# Patient Record
Sex: Female | Born: 1955
Health system: Southern US, Community
[De-identification: ages and names within clinical notes are randomized; demographics above are authoritative.]

## PROBLEM LIST (undated history)

## (undated) ENCOUNTER — Inpatient Hospital Stay (HOSPITAL_COMMUNITY): Payer: 59

## (undated) DIAGNOSIS — M419 Scoliosis, unspecified: Secondary | ICD-10-CM

## (undated) DIAGNOSIS — D696 Thrombocytopenia, unspecified: Secondary | ICD-10-CM

## (undated) DIAGNOSIS — I480 Paroxysmal atrial fibrillation: Secondary | ICD-10-CM

## (undated) DIAGNOSIS — E119 Type 2 diabetes mellitus without complications: Secondary | ICD-10-CM

## (undated) DIAGNOSIS — H524 Presbyopia: Secondary | ICD-10-CM

## (undated) DIAGNOSIS — I5032 Chronic diastolic (congestive) heart failure: Secondary | ICD-10-CM

## (undated) DIAGNOSIS — H52203 Unspecified astigmatism, bilateral: Secondary | ICD-10-CM

## (undated) DIAGNOSIS — E739 Lactose intolerance, unspecified: Secondary | ICD-10-CM

## (undated) DIAGNOSIS — G4720 Circadian rhythm sleep disorder, unspecified type: Secondary | ICD-10-CM

## (undated) DIAGNOSIS — R112 Nausea with vomiting, unspecified: Secondary | ICD-10-CM

## (undated) DIAGNOSIS — Z1589 Genetic susceptibility to other disease: Secondary | ICD-10-CM

## (undated) DIAGNOSIS — R609 Edema, unspecified: Secondary | ICD-10-CM

## (undated) DIAGNOSIS — H16223 Keratoconjunctivitis sicca, not specified as Sjogren's, bilateral: Secondary | ICD-10-CM

## (undated) DIAGNOSIS — IMO0002 Reserved for concepts with insufficient information to code with codable children: Secondary | ICD-10-CM

## (undated) DIAGNOSIS — M7711 Lateral epicondylitis, right elbow: Secondary | ICD-10-CM

## (undated) DIAGNOSIS — G4730 Sleep apnea, unspecified: Secondary | ICD-10-CM

## (undated) DIAGNOSIS — D6861 Antiphospholipid syndrome: Secondary | ICD-10-CM

## (undated) DIAGNOSIS — K5790 Diverticulosis of intestine, part unspecified, without perforation or abscess without bleeding: Secondary | ICD-10-CM

## (undated) DIAGNOSIS — K760 Fatty (change of) liver, not elsewhere classified: Secondary | ICD-10-CM

## (undated) DIAGNOSIS — J302 Other seasonal allergic rhinitis: Secondary | ICD-10-CM

## (undated) DIAGNOSIS — M65342 Trigger finger, left ring finger: Secondary | ICD-10-CM

## (undated) DIAGNOSIS — I440 Atrioventricular block, first degree: Secondary | ICD-10-CM

## (undated) DIAGNOSIS — Z87442 Personal history of urinary calculi: Secondary | ICD-10-CM

## (undated) DIAGNOSIS — T7840XA Allergy, unspecified, initial encounter: Secondary | ICD-10-CM

## (undated) DIAGNOSIS — M40209 Unspecified kyphosis, site unspecified: Secondary | ICD-10-CM

## (undated) DIAGNOSIS — M5137 Other intervertebral disc degeneration, lumbosacral region: Secondary | ICD-10-CM

## (undated) DIAGNOSIS — K219 Gastro-esophageal reflux disease without esophagitis: Secondary | ICD-10-CM

## (undated) DIAGNOSIS — N390 Urinary tract infection, site not specified: Secondary | ICD-10-CM

## (undated) DIAGNOSIS — E042 Nontoxic multinodular goiter: Secondary | ICD-10-CM

## (undated) DIAGNOSIS — I10 Essential (primary) hypertension: Secondary | ICD-10-CM

## (undated) DIAGNOSIS — R7302 Impaired glucose tolerance (oral): Secondary | ICD-10-CM

## (undated) DIAGNOSIS — E785 Hyperlipidemia, unspecified: Secondary | ICD-10-CM

## (undated) DIAGNOSIS — M461 Sacroiliitis, not elsewhere classified: Secondary | ICD-10-CM

## (undated) DIAGNOSIS — N8110 Cystocele, unspecified: Secondary | ICD-10-CM

## (undated) DIAGNOSIS — N200 Calculus of kidney: Secondary | ICD-10-CM

## (undated) DIAGNOSIS — R002 Palpitations: Secondary | ICD-10-CM

## (undated) DIAGNOSIS — H7292 Unspecified perforation of tympanic membrane, left ear: Secondary | ICD-10-CM

## (undated) DIAGNOSIS — I491 Atrial premature depolarization: Secondary | ICD-10-CM

## (undated) DIAGNOSIS — N870 Mild cervical dysplasia: Secondary | ICD-10-CM

## (undated) DIAGNOSIS — M1990 Unspecified osteoarthritis, unspecified site: Secondary | ICD-10-CM

## (undated) DIAGNOSIS — N12 Tubulo-interstitial nephritis, not specified as acute or chronic: Secondary | ICD-10-CM

## (undated) DIAGNOSIS — K559 Vascular disorder of intestine, unspecified: Secondary | ICD-10-CM

## (undated) DIAGNOSIS — Z8742 Personal history of other diseases of the female genital tract: Secondary | ICD-10-CM

## (undated) DIAGNOSIS — I471 Supraventricular tachycardia: Secondary | ICD-10-CM

## (undated) DIAGNOSIS — Z8719 Personal history of other diseases of the digestive system: Secondary | ICD-10-CM

## (undated) DIAGNOSIS — R011 Cardiac murmur, unspecified: Secondary | ICD-10-CM

## (undated) DIAGNOSIS — D135 Benign neoplasm of extrahepatic bile ducts: Secondary | ICD-10-CM

## (undated) DIAGNOSIS — G43009 Migraine without aura, not intractable, without status migrainosus: Secondary | ICD-10-CM

## (undated) DIAGNOSIS — E669 Obesity, unspecified: Secondary | ICD-10-CM

## (undated) DIAGNOSIS — B9620 Unspecified Escherichia coli [E. coli] as the cause of diseases classified elsewhere: Secondary | ICD-10-CM

## (undated) DIAGNOSIS — R7303 Prediabetes: Secondary | ICD-10-CM

## (undated) DIAGNOSIS — N943 Premenstrual tension syndrome: Secondary | ICD-10-CM

## (undated) DIAGNOSIS — R600 Localized edema: Secondary | ICD-10-CM

## (undated) DIAGNOSIS — I4510 Unspecified right bundle-branch block: Secondary | ICD-10-CM

## (undated) DIAGNOSIS — H5203 Hypermetropia, bilateral: Secondary | ICD-10-CM

## (undated) DIAGNOSIS — K8020 Calculus of gallbladder without cholecystitis without obstruction: Secondary | ICD-10-CM

## (undated) DIAGNOSIS — R0902 Hypoxemia: Secondary | ICD-10-CM

## (undated) DIAGNOSIS — N7011 Chronic salpingitis: Secondary | ICD-10-CM

## (undated) DIAGNOSIS — K589 Irritable bowel syndrome without diarrhea: Secondary | ICD-10-CM

## (undated) DIAGNOSIS — G43909 Migraine, unspecified, not intractable, without status migrainosus: Secondary | ICD-10-CM

## (undated) DIAGNOSIS — E559 Vitamin D deficiency, unspecified: Secondary | ICD-10-CM

## (undated) DIAGNOSIS — R319 Hematuria, unspecified: Secondary | ICD-10-CM

## (undated) DIAGNOSIS — A419 Sepsis, unspecified organism: Secondary | ICD-10-CM

## (undated) DIAGNOSIS — K769 Liver disease, unspecified: Secondary | ICD-10-CM

## (undated) DIAGNOSIS — M7989 Other specified soft tissue disorders: Secondary | ICD-10-CM

## (undated) DIAGNOSIS — F329 Major depressive disorder, single episode, unspecified: Secondary | ICD-10-CM

## (undated) DIAGNOSIS — D219 Benign neoplasm of connective and other soft tissue, unspecified: Secondary | ICD-10-CM

## (undated) DIAGNOSIS — F988 Other specified behavioral and emotional disorders with onset usually occurring in childhood and adolescence: Secondary | ICD-10-CM

## (undated) DIAGNOSIS — Z9889 Other specified postprocedural states: Secondary | ICD-10-CM

## (undated) DIAGNOSIS — F32A Depression, unspecified: Secondary | ICD-10-CM

## (undated) HISTORY — PX: DUPUYTREN CONTRACTURE RELEASE: SHX1478

## (undated) HISTORY — DX: Other specified soft tissue disorders: M79.89

## (undated) HISTORY — PX: CERVICAL CONE BIOPSY: SUR198

## (undated) HISTORY — PX: BLADDER SURGERY: SHX569

## (undated) HISTORY — DX: Cardiac murmur, unspecified: R01.1

## (undated) HISTORY — DX: Impaired glucose tolerance (oral): R73.02

## (undated) HISTORY — PX: ESOPHAGOGASTRODUODENOSCOPY: SHX1529

## (undated) HISTORY — DX: Reserved for concepts with insufficient information to code with codable children: IMO0002

## (undated) HISTORY — DX: Circadian rhythm sleep disorder, unspecified type: G47.20

## (undated) HISTORY — DX: Presbyopia: H52.4

## (undated) HISTORY — DX: Sacroiliitis, not elsewhere classified: M46.1

## (undated) HISTORY — DX: Lactose intolerance, unspecified: E73.9

## (undated) HISTORY — PX: EYE SURGERY: SHX253

## (undated) HISTORY — PX: OTHER SURGICAL HISTORY: SHX169

## (undated) HISTORY — DX: Fatty (change of) liver, not elsewhere classified: K76.0

## (undated) HISTORY — DX: Palpitations: R00.2

## (undated) HISTORY — DX: Depression, unspecified: F32.A

## (undated) HISTORY — DX: Premenstrual tension syndrome: N94.3

## (undated) HISTORY — DX: Trigger finger, left ring finger: M65.342

## (undated) HISTORY — DX: Unspecified astigmatism, bilateral: H52.203

## (undated) HISTORY — DX: Hypermetropia, bilateral: H52.03

## (undated) HISTORY — DX: Cystocele, unspecified: N81.10

## (undated) HISTORY — DX: Genetic susceptibility to other disease: Z15.89

## (undated) HISTORY — DX: Hyperlipidemia, unspecified: E78.5

## (undated) HISTORY — DX: Vitamin D deficiency, unspecified: E55.9

## (undated) HISTORY — DX: Unspecified kyphosis, site unspecified: M40.209

## (undated) HISTORY — DX: Essential (primary) hypertension: I10

## (undated) HISTORY — DX: Migraine, unspecified, not intractable, without status migrainosus: G43.909

## (undated) HISTORY — DX: Mild cervical dysplasia: N87.0

## (undated) HISTORY — DX: Calculus of gallbladder without cholecystitis without obstruction: K80.20

## (undated) HISTORY — DX: Sleep apnea, unspecified: G47.30

## (undated) HISTORY — DX: Personal history of other diseases of the digestive system: Z87.19

## (undated) HISTORY — PX: TRANSOBTURATOR SLING: SHX2571

## (undated) HISTORY — DX: Nontoxic multinodular goiter: E04.2

## (undated) HISTORY — PX: COLONOSCOPY W/ BIOPSIES: SHX1374

## (undated) HISTORY — DX: Major depressive disorder, single episode, unspecified: F32.9

## (undated) HISTORY — DX: Type 2 diabetes mellitus without complications: E11.9

## (undated) HISTORY — PX: LIVER BIOPSY: SHX301

## (undated) HISTORY — DX: Lateral epicondylitis, right elbow: M77.11

## (undated) HISTORY — DX: Vascular disorder of intestine, unspecified: K55.9

## (undated) HISTORY — DX: Chronic diastolic (congestive) heart failure: I50.32

## (undated) HISTORY — DX: Migraine without aura, not intractable, without status migrainosus: G43.009

## (undated) HISTORY — DX: Irritable bowel syndrome, unspecified: K58.9

## (undated) HISTORY — DX: Prediabetes: R73.03

## (undated) HISTORY — DX: Atrial premature depolarization: I49.1

## (undated) HISTORY — PX: DIAGNOSTIC LAPAROSCOPY: SUR761

## (undated) HISTORY — PX: BREAST BIOPSY: SHX20

## (undated) HISTORY — DX: Keratoconjunctivitis sicca, not specified as Sjogren's, bilateral: H16.223

## (undated) HISTORY — DX: Unspecified osteoarthritis, unspecified site: M19.90

## (undated) HISTORY — DX: Allergy, unspecified, initial encounter: T78.40XA

---

## 1988-05-20 HISTORY — PX: LASIK: SHX215

## 1992-05-20 HISTORY — PX: PHOTOREFRACTIVE KERATOTOMY: SHX216

## 1992-05-20 HISTORY — PX: RADIAL KERATOTOMY: SHX217

## 1999-10-19 ENCOUNTER — Encounter: Admission: RE | Admit: 1999-10-19 | Discharge: 1999-10-19 | Payer: Self-pay | Admitting: Obstetrics and Gynecology

## 1999-10-19 ENCOUNTER — Encounter: Payer: Self-pay | Admitting: Obstetrics and Gynecology

## 1999-10-26 ENCOUNTER — Other Ambulatory Visit: Admission: RE | Admit: 1999-10-26 | Discharge: 1999-10-26 | Payer: Self-pay | Admitting: Obstetrics and Gynecology

## 1999-11-09 ENCOUNTER — Encounter: Payer: Self-pay | Admitting: Obstetrics and Gynecology

## 1999-11-09 ENCOUNTER — Ambulatory Visit (HOSPITAL_COMMUNITY): Admission: RE | Admit: 1999-11-09 | Discharge: 1999-11-09 | Payer: Self-pay | Admitting: Obstetrics and Gynecology

## 2001-12-31 ENCOUNTER — Encounter: Payer: Self-pay | Admitting: Cardiovascular Disease

## 2001-12-31 ENCOUNTER — Encounter: Payer: Self-pay | Admitting: Obstetrics and Gynecology

## 2001-12-31 ENCOUNTER — Other Ambulatory Visit: Admission: RE | Admit: 2001-12-31 | Discharge: 2001-12-31 | Payer: Self-pay | Admitting: Obstetrics and Gynecology

## 2001-12-31 ENCOUNTER — Encounter: Admission: RE | Admit: 2001-12-31 | Discharge: 2001-12-31 | Payer: Self-pay | Admitting: Obstetrics and Gynecology

## 2002-01-05 ENCOUNTER — Encounter: Admission: RE | Admit: 2002-01-05 | Discharge: 2002-01-05 | Payer: Self-pay | Admitting: Obstetrics and Gynecology

## 2002-01-05 ENCOUNTER — Encounter: Payer: Self-pay | Admitting: Obstetrics and Gynecology

## 2002-01-05 ENCOUNTER — Encounter (INDEPENDENT_AMBULATORY_CARE_PROVIDER_SITE_OTHER): Payer: Self-pay | Admitting: *Deleted

## 2002-03-17 ENCOUNTER — Ambulatory Visit (HOSPITAL_BASED_OUTPATIENT_CLINIC_OR_DEPARTMENT_OTHER): Admission: RE | Admit: 2002-03-17 | Discharge: 2002-03-17 | Payer: Self-pay | Admitting: Surgery

## 2002-03-17 ENCOUNTER — Encounter (INDEPENDENT_AMBULATORY_CARE_PROVIDER_SITE_OTHER): Payer: Self-pay | Admitting: *Deleted

## 2002-03-26 ENCOUNTER — Ambulatory Visit (HOSPITAL_COMMUNITY): Admission: RE | Admit: 2002-03-26 | Discharge: 2002-03-26 | Payer: Self-pay | Admitting: Obstetrics and Gynecology

## 2002-03-26 ENCOUNTER — Encounter (INDEPENDENT_AMBULATORY_CARE_PROVIDER_SITE_OTHER): Payer: Self-pay | Admitting: Specialist

## 2003-03-25 ENCOUNTER — Other Ambulatory Visit: Admission: RE | Admit: 2003-03-25 | Discharge: 2003-03-25 | Payer: Self-pay | Admitting: Obstetrics and Gynecology

## 2003-04-19 ENCOUNTER — Encounter: Admission: RE | Admit: 2003-04-19 | Discharge: 2003-04-19 | Payer: Self-pay | Admitting: Family Medicine

## 2003-05-19 ENCOUNTER — Encounter: Payer: Self-pay | Admitting: Internal Medicine

## 2003-06-08 ENCOUNTER — Encounter: Payer: Self-pay | Admitting: Internal Medicine

## 2003-07-14 ENCOUNTER — Encounter: Payer: Self-pay | Admitting: Internal Medicine

## 2003-07-21 ENCOUNTER — Encounter: Payer: Self-pay | Admitting: Internal Medicine

## 2003-08-23 ENCOUNTER — Encounter: Payer: Self-pay | Admitting: Cardiovascular Disease

## 2003-09-01 ENCOUNTER — Encounter: Payer: Self-pay | Admitting: Internal Medicine

## 2004-04-26 ENCOUNTER — Other Ambulatory Visit: Admission: RE | Admit: 2004-04-26 | Discharge: 2004-04-26 | Payer: Self-pay | Admitting: Obstetrics and Gynecology

## 2004-08-28 ENCOUNTER — Encounter (INDEPENDENT_AMBULATORY_CARE_PROVIDER_SITE_OTHER): Payer: Self-pay | Admitting: Specialist

## 2004-08-28 ENCOUNTER — Encounter: Admission: RE | Admit: 2004-08-28 | Discharge: 2004-08-28 | Payer: Self-pay | Admitting: Obstetrics and Gynecology

## 2004-12-11 ENCOUNTER — Encounter: Admission: RE | Admit: 2004-12-11 | Discharge: 2005-03-11 | Payer: Self-pay | Admitting: Family Medicine

## 2005-04-02 ENCOUNTER — Encounter: Admission: RE | Admit: 2005-04-02 | Discharge: 2005-04-02 | Payer: Self-pay | Admitting: Obstetrics and Gynecology

## 2005-05-24 ENCOUNTER — Other Ambulatory Visit: Admission: RE | Admit: 2005-05-24 | Discharge: 2005-05-24 | Payer: Self-pay | Admitting: Obstetrics and Gynecology

## 2005-05-25 ENCOUNTER — Ambulatory Visit (HOSPITAL_COMMUNITY): Admission: RE | Admit: 2005-05-25 | Discharge: 2005-05-25 | Payer: Self-pay | Admitting: Obstetrics and Gynecology

## 2005-05-25 ENCOUNTER — Encounter (INDEPENDENT_AMBULATORY_CARE_PROVIDER_SITE_OTHER): Payer: Self-pay | Admitting: Specialist

## 2005-07-25 ENCOUNTER — Encounter: Admission: RE | Admit: 2005-07-25 | Discharge: 2005-10-23 | Payer: Self-pay | Admitting: Family Medicine

## 2005-10-28 ENCOUNTER — Encounter: Admission: RE | Admit: 2005-10-28 | Discharge: 2005-10-28 | Payer: Self-pay | Admitting: Obstetrics and Gynecology

## 2006-05-06 ENCOUNTER — Encounter: Payer: Self-pay | Admitting: Cardiovascular Disease

## 2007-03-13 ENCOUNTER — Inpatient Hospital Stay (HOSPITAL_COMMUNITY): Admission: AD | Admit: 2007-03-13 | Discharge: 2007-03-13 | Payer: Self-pay | Admitting: Obstetrics and Gynecology

## 2007-05-27 ENCOUNTER — Encounter: Payer: Self-pay | Admitting: Cardiovascular Disease

## 2007-08-06 ENCOUNTER — Encounter: Payer: Self-pay | Admitting: Cardiovascular Disease

## 2007-08-07 ENCOUNTER — Ambulatory Visit (HOSPITAL_COMMUNITY): Admission: RE | Admit: 2007-08-07 | Discharge: 2007-08-07 | Payer: Self-pay | Admitting: Obstetrics and Gynecology

## 2008-05-20 DIAGNOSIS — M419 Scoliosis, unspecified: Secondary | ICD-10-CM

## 2008-05-20 HISTORY — DX: Scoliosis, unspecified: M41.9

## 2008-07-14 ENCOUNTER — Encounter
Admission: RE | Admit: 2008-07-14 | Discharge: 2008-07-14 | Payer: Self-pay | Admitting: Physical Medicine and Rehabilitation

## 2008-10-06 ENCOUNTER — Encounter: Payer: Self-pay | Admitting: Internal Medicine

## 2008-11-17 HISTORY — PX: KNEE ARTHROSCOPY: SHX127

## 2009-04-10 ENCOUNTER — Encounter: Admission: RE | Admit: 2009-04-10 | Discharge: 2009-04-10 | Payer: Self-pay | Admitting: Obstetrics and Gynecology

## 2009-07-20 ENCOUNTER — Encounter (INDEPENDENT_AMBULATORY_CARE_PROVIDER_SITE_OTHER): Payer: Self-pay | Admitting: *Deleted

## 2009-08-05 ENCOUNTER — Encounter: Payer: Self-pay | Admitting: Internal Medicine

## 2009-08-08 ENCOUNTER — Encounter: Payer: Self-pay | Admitting: Internal Medicine

## 2009-08-22 ENCOUNTER — Encounter (INDEPENDENT_AMBULATORY_CARE_PROVIDER_SITE_OTHER): Payer: Self-pay | Admitting: *Deleted

## 2009-08-22 ENCOUNTER — Ambulatory Visit: Payer: Self-pay | Admitting: Internal Medicine

## 2009-08-22 DIAGNOSIS — R197 Diarrhea, unspecified: Secondary | ICD-10-CM | POA: Insufficient documentation

## 2009-08-22 DIAGNOSIS — R159 Full incontinence of feces: Secondary | ICD-10-CM | POA: Insufficient documentation

## 2009-08-24 LAB — CONVERTED CEMR LAB: Tissue Transglutaminase Ab, IgA: 0.3 units (ref <7)

## 2009-08-25 ENCOUNTER — Ambulatory Visit (HOSPITAL_COMMUNITY): Admission: RE | Admit: 2009-08-25 | Discharge: 2009-08-25 | Payer: Self-pay | Admitting: Obstetrics and Gynecology

## 2009-11-17 HISTORY — PX: DILATION AND CURETTAGE OF UTERUS: SHX78

## 2009-12-14 ENCOUNTER — Ambulatory Visit: Payer: Self-pay | Admitting: Sports Medicine

## 2009-12-14 DIAGNOSIS — IMO0002 Reserved for concepts with insufficient information to code with codable children: Secondary | ICD-10-CM | POA: Insufficient documentation

## 2009-12-15 ENCOUNTER — Encounter: Admission: RE | Admit: 2009-12-15 | Discharge: 2009-12-15 | Payer: Self-pay | Admitting: Sports Medicine

## 2009-12-28 ENCOUNTER — Ambulatory Visit: Payer: Self-pay | Admitting: Sports Medicine

## 2009-12-28 DIAGNOSIS — IMO0002 Reserved for concepts with insufficient information to code with codable children: Secondary | ICD-10-CM | POA: Insufficient documentation

## 2010-01-03 ENCOUNTER — Ambulatory Visit: Payer: Self-pay | Admitting: Sports Medicine

## 2010-01-09 ENCOUNTER — Encounter: Payer: Self-pay | Admitting: Sports Medicine

## 2010-01-09 ENCOUNTER — Encounter: Admission: RE | Admit: 2010-01-09 | Discharge: 2010-02-14 | Payer: Self-pay | Admitting: Sports Medicine

## 2010-02-14 ENCOUNTER — Encounter: Payer: Self-pay | Admitting: Sports Medicine

## 2010-04-01 ENCOUNTER — Emergency Department (HOSPITAL_COMMUNITY)
Admission: EM | Admit: 2010-04-01 | Discharge: 2010-04-01 | Payer: Self-pay | Source: Home / Self Care | Admitting: Family Medicine

## 2010-06-09 ENCOUNTER — Encounter: Payer: Self-pay | Admitting: Obstetrics and Gynecology

## 2010-06-19 NOTE — Letter (Signed)
Summary: Clovis Pu PA  Clovis Pu PA   Imported By: Sherian Rein 08/30/2009 07:56:39  _____________________________________________________________________  External Attachment:    Type:   Image     Comment:   External Document

## 2010-06-19 NOTE — Procedures (Signed)
Summary: EGD  EGD   Imported By: Sherian Rein 08/30/2009 07:52:27  _____________________________________________________________________  External Attachment:    Type:   Image     Comment:   External Document

## 2010-06-19 NOTE — Procedures (Signed)
Summary: Colonoscopy  Colonoscopy   Imported By: Sherian Rein 08/30/2009 07:51:42  _____________________________________________________________________  External Attachment:    Type:   Image     Comment:   External Document

## 2010-06-19 NOTE — Letter (Signed)
Summary: [facility] Instructions  [facility]  [address] [place], [place] [address]   Phone: [phone]  Fax: [phone]       Alexandra Walker    [birth year]    MRN: [id number]      Procedure Day Dorna Bloom: Jake Shark, Sep 19, 2009     Arrival Time: 2:30 PM      Procedure Time: 3:30 PM    Location of Procedure:                    _X_  [facility] (4th Floor)                      PREPARATION FOR COLONOSCOPY WITH MOVIPREP   Starting 5 days prior to your procedure 09/16/09 do not eat nuts, seeds, popcorn, corn, beans, peas,  salads, or any raw vegetables.  Do not take any fiber supplements (e.g. Metamucil, Citrucel, and Benefiber).  THE DAY BEFORE YOUR PROCEDURE         MONDAY, 09/18/09  1.  Drink clear liquids the entire day-NO SOLID FOOD  2.  Do not drink anything colored red or purple.  Avoid juices with pulp.  No orange juice.  3.  Drink at least 64 oz. (8 glasses) of fluid/clear liquids during the day to prevent dehydration and help the prep work efficiently.  CLEAR LIQUIDS INCLUDE: Water Jello Ice Popsicles Tea (sugar ok, no milk/cream) Powdered fruit flavored drinks Coffee (sugar ok, no milk/cream) Gatorade Juice: apple, white grape, white cranberry  Lemonade Clear bullion, consomm, broth Carbonated beverages (any kind) Strained chicken noodle soup Hard Candy                             4.  In the morning, mix first dose of MoviPrep solution:    Empty 1 Pouch A and 1 Pouch B into the disposable container    Add lukewarm drinking water to the top line of the container. Mix to dissolve    Refrigerate (mixed solution should be used within 24 hrs)  5.  Begin drinking the prep at 5:00 p.m. The MoviPrep container is divided by 4 marks.   Every 15 minutes drink the solution down to the next mark (approximately 8 oz) until the full liter is complete.   6.  Follow completed prep with 16 oz of clear liquid of your choice (Nothing red or purple).   Continue to drink clear liquids until bedtime.  7.  Before going to bed, mix second dose of MoviPrep solution:    Empty 1 Pouch A and 1 Pouch B into the disposable container    Add lukewarm drinking water to the top line of the container. Mix to dissolve    Refrigerate  THE DAY OF YOUR PROCEDURE     TUESDAY, 09/19/09  Beginning at 10:30 a.m. (5 hours before procedure):         1. Every 15 minutes, drink the solution down to the next mark (approx 8 oz) until the full liter is complete.  2. Follow completed prep with 16 oz. of clear liquid of your choice.    3. You may drink clear liquids until 1:30 PM (2 HOURS BEFORE PROCEDURE).  MEDICATION INSTRUCTIONS  Unless otherwise instructed, you should take regular prescription medications with a small sip of water   as early as possible the morning of your procedure.       OTHER INSTRUCTIONS  You will need a responsible adult at least 53 years of age to accompany you and drive you home.   This person must remain in the waiting room during your procedure.  Wear loose fitting clothing that is easily removed.  Leave jewelry and other valuables at home.  However, you may wish to bring a book to read or  an iPod/MP3 player to listen to music as you wait for your procedure to start.  Remove all body piercing jewelry and leave at home.  Total time from sign-in until discharge is approximately 2-3 hours.  You should go home directly after your procedure and rest.  You can resume normal activities the  day after your procedure.  The day of your procedure you should not:   Drive   Make legal decisions   Operate machinery   Drink alcohol   Return to work  You will receive specific instructions about eating, activities and medications before you leave.  The above instructions have been reviewed and explained to me by   STEPHHANIE, CMA   I fully understand and can verbalize these instructions _____________________________ Date  _________

## 2010-06-19 NOTE — Assessment & Plan Note (Signed)
Summary: BACK INJECTION,MC   Vital Signs:  Patient profile:   55 year old female BP sitting:   146 / 74  Vitals Entered By: Lillia Pauls CMA (January 03, 2010 10:16 AM)  Primary Provider:  Montey Hora, MD   History of Present Illness: Alexandra Walker was doing better she wound up selling her house so quickly had to do a lot of moving boxes, etc This led to another flare of low thoracic back pain severe enough that on weekend had [facility] this helped only briefly  had one day unable to work but icing all during day broke a lot of the spasm  comes for reck and TX plan  Allergies: No Known Drug Allergies  Physical Exam  General:  Well-developed,well-nourished,in no acute distress; alert,appropriate and cooperative throughout examination Msk:  Mid back is free of spasm On RT paraspinous area of T 10 there is localized area of tightness, spasm and TTP  othyerwise exam is not remarkable   Impression & Recommendations:  Problem # 1:  DEGENERATIVE DISC DISEASE, 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 Once daily 2)  Toprol Xl 25 Mg Xr24h-tab (Metoprolol succinate) .... Once daily 3)  Hydrochlorothiazide 25 Mg Tabs (Hydrochlorothiazide) .... Once daily 4)  Motrin Ib 200 Mg Tabs (Ibuprofen) .... As needed 5)  Tramadol Hcl 50 Mg Tabs (Tramadol hcl) .Marland Kitchen.. 1 by mouth q 6h 6)  Gabapentin 300 Mg Caps (Gabapentin) .Marland Kitchen.. 1 by mouth tid  Patient Instructions: 1)  Use ice for spasm relief as this works 2)  start gabapentin just at night 3)  300 mgm per night for 1 week 4)   if not enough relief go 600 mgm at night 5)  after 3 weeks if no relief go to 900 at bedtime before you see me 6)  Keep up tramadol at least two times a day 7)  recheck 1 month Prescriptions: GABAPENTIN 300 MG CAPS (GABAPENTIN) 1 by mouth tid  #90 x 3   Entered and Authorized by:   Enid Baas MD   Signed by:   Enid Baas MD on 01/03/2010   Method used:   Electronically to        [facility]* (retail)       [address]       [address] Shipping/mailing       [place], [place]  [address]       Ph: [phone]       Fax: [phone]   RxID:   [id number]

## 2010-06-19 NOTE — Letter (Signed)
Summary: [facility]  [facility]   Imported By: Sherian Rein 08/30/2009 07:46:25  _____________________________________________________________________  External Attachment:    Type:   Image     Comment:   External Document

## 2010-06-19 NOTE — Assessment & Plan Note (Signed)
Summary: 4:00,MID BACK PAIN,[employer] EMPLOYEE,MC   Vital Signs:  Patient profile:   55 year old female Height:      67 inches Pulse rate:   85 / minute BP sitting:   144 / 83  (left arm) Cuff size:   large  Vitals Entered By: Tessie Fass CMA (December 14, 2009 4:12 PM) CC: back pain x 3 days Pain Assessment Patient in pain? yes     Location: back Intensity: 5   Primary Provider:  Montey Hora, MD  CC:  back pain x 3 days.  History of Present Illness: Patient w hx of LBP works as Statistician at [employer] 5 to 6 days ago LBP hurt past 2 to 3 days severe spasm  pain radiates f low thoracic to abdominal area primarily around RT side  2 prior episodes lasted about 2 weeks  physically acitve but not in execise program  Saw Dr [name] Ishihara were negative lidoderm and flexoril helped last episode went thru PT for exercises  Take motrin for SI joint soma in day and flexeril at night  Allergies: No Known Drug Allergies  Physical Exam  General:  Obese,,well-nourished,in no acute distress; alert,appropriate and cooperative throughout examination  looks uncomfortable w movement Msk:  Spasm noted from T6 to T10 primarily on RT paraspinous mm facet jts seem locked low back not tender  heel , toe and tandem walk nl flex 90 deg no pain 30 deg ext causes pain lat bend to rt pain at 10 deg lat bend to left OK   Impression & Recommendations:  Problem # 1:  BACK PAIN WITH RADICULOPATHY (ICD-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 Once daily 2)  Toprol Xl 25 Mg Xr24h-tab (Metoprolol succinate) .... Once daily 3)  Hydrochlorothiazide 25 Mg Tabs (Hydrochlorothiazide) .... Once daily 4)  Motrin Ib 200 Mg Tabs (Ibuprofen) .... As needed 5)  Tramadol Hcl 50 Mg Tabs (Tramadol hcl) .Marland Kitchen.. 1 by mouth q 6h Prescriptions: TRAMADOL HCL 50 MG TABS (TRAMADOL HCL) 1 by mouth q 6h  #60 x 2   Entered and Authorized by:   Enid Baas MD   Signed by:   Enid Baas MD on 12/14/2009   Method used:   Electronically to        [facility]* (retail)       [address]       [address] Shipping/mailing       [place], [place]  [address]       Ph: [phone]       Fax: [phone]   RxID:   [id number]

## 2010-06-19 NOTE — Letter (Signed)
[facility]   Imported By: Sherian Rein 08/30/2009 07:53:15  _____________________________________________________________________  External Attachment:    Type:   Image     Comment:   External Document

## 2010-06-19 NOTE — Letter (Signed)
[facility]   Imported By: Sherian Rein 08/30/2009 07:54:26  _____________________________________________________________________  External Attachment:    Type:   Image     Comment:   External Document

## 2010-06-19 NOTE — Letter (Signed)
[facility]   Imported By: Sherian Rein 08/30/2009 07:49:35  _____________________________________________________________________  External Attachment:    Type:   Image     Comment:   External Document

## 2010-06-19 NOTE — Procedures (Signed)
Summary: EGD & Colonoscopy/[facility]  [facility] & [facility]oscopy/[facility]   Imported By: Sherian Rein 08/30/2009 08:01:36  _____________________________________________________________________  External Attachment:    Type:   Image     Comment:   External Document

## 2010-06-19 NOTE — Letter (Signed)
Summary: New Patient letter  [facility]  [address] [place], [place] [address]   Phone: [phone]  Fax: [phone]       07/20/2009 MRN: [id number]  Alexandra Walker [address] [place], [place]  [address]  Dear Alexandra Walker,  Welcome to the Gastroenterology Division at [facility].    You are scheduled to see Dr. Leone Payor on 08-22-09 at 8:45a.m. on the 3rd floor at [facility], [address]  We ask that you try to arrive at our office 15 minutes prior to your appointment time to allow for check-in.  We would like you to complete the enclosed self-administered evaluation form prior to your visit and bring it with you on the day of your appointment.  We will review it with you.  Also, please bring a complete list of all your medications or, if you prefer, bring the medication bottles and we will list them.  Please bring your insurance card so that we may make a copy of it.  If your insurance requires a referral to see a specialist, please bring your referral form from your primary care physician.  Co-payments are due at the time of your visit and may be paid by cash, check or credit card.     Your office visit will consist of a consult with your physician (includes a physical exam), any laboratory testing he/she may order, scheduling of any necessary diagnostic testing (e.g. x-ray, ultrasound, CT-scan), and scheduling of a procedure (e.g. Endoscopy, Colonoscopy) if required.  Please allow enough time on your schedule to allow for any/all of these possibilities.    If you cannot keep your appointment, please call [phone] to cancel or reschedule prior to your appointment date.  This allows [name] the opportunity to schedule an appointment for another patient in need of care.  If you do not cancel or reschedule by 5 p.m. the business day prior to your appointment date, you will be charged a $50.00 late cancellation/no-show fee.    Thank you for choosing  [facility] for your medical needs.  We appreciate the opportunity to care for you.  Please visit [name] at our website  to learn more about our practice.                     Sincerely,                                                             The Gastroenterology Division

## 2010-06-19 NOTE — Assessment & Plan Note (Signed)
Summary: POST MRI,MC   Vital Signs:  Patient profile:   55 year old female BP sitting:   136 / 81  Vitals Entered By: Lillia Pauls CMA (December 28, 2009 10:23 AM)  Primary Provider:  Montey Hora, MD   History of Present Illness: Hilda Lias improved dramatically with some low dose tramadol and rest. Mid back pain stpped suddely after resting on couch about 3 days after starting tramadol. 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 Does not remember any diagnosis of back issues though. told to do posture exercises.  nopw using ibuprofen for sciatica.  not using tramadol.  comes for review of MRI and prevention of future flares.  Allergies: No Known Drug Allergies  Physical Exam  General:  Well-developed,well-nourished,in no acute distress; alert,appropriate and cooperative throughout examination Msk:  posture allows head forward by 5 deg  ant rotation of shoulders with mild IR of scapula bilat  stands with mild thoracic kyphosis  walking gait looks normal today Additional Exam:  MRI  reviewed w patient and shows a lot of thoracic area change mid thoracic compression and DDD Schmorl's nodes disk bulging and some cord signal change in thoracic area   Impression & Recommendations:  Problem # 1:  BACK PAIN WITH RADICULOPATHY (ICD-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 Once daily 2)  Toprol Xl 25 Mg Xr24h-tab (Metoprolol succinate) .... Once daily 3)  Hydrochlorothiazide 25 Mg Tabs  (Hydrochlorothiazide) .... Once daily 4)  Motrin Ib 200 Mg Tabs (Ibuprofen) .... As needed 5)  Tramadol Hcl 50 Mg Tabs (Tramadol hcl) .Marland Kitchen.. 1 by mouth q 6h  Appended Document: POST MRI,MC

## 2010-06-19 NOTE — Miscellaneous (Signed)
Summary: [facility]  [facility]   Imported By: Marily Memos 02/15/2010 10:39:45  _____________________________________________________________________  External Attachment:    Type:   Image     Comment:   External Document

## 2010-06-19 NOTE — Letter (Signed)
Summary: [facility]  [facility]   Imported By: Sherian Rein 08/30/2009 07:48:23  _____________________________________________________________________  External Attachment:    Type:   Image     Comment:   External Document

## 2010-06-19 NOTE — Letter (Signed)
[facility]   Imported By: Sherian Rein 08/30/2009 07:58:30  _____________________________________________________________________  External Attachment:    Type:   Image     Comment:   External Document

## 2010-06-19 NOTE — Assessment & Plan Note (Signed)
Summary: IBS--ch.   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 She will get a "pinging "RUQ pain intermittently especially when she has a fever. IBS diarrhea is ruinng her lfe, urgent defecation which has caused incontinence and ne ed to leave delivery room, etc. Chronic for 10-15 years, at its worst 5 days a week. Series of bowel movements, empty and then possibly none the next day. Pre-defecatory cramps only. Not nocturnal issue. Walking and eating may stimulate. She drinks 6-8 diet cokes a day. She has reduced sorbitol consumption. No difference. She went to accupuncture for menopausal symptoms, received an herbal concoction called Chinese Fungus Formula. It will corol the diarrhea. 1-2 bowel movements a day and almost dry. Lactose intolerace is not suspected.  FNH seen on [name] and bx in 1997. All other GI ROS negative.          Preventive Screening-Counseling & Management  Alcohol-Tobacco     Smoking Status: never      Drug Use:  no.      Colonoscopy  Procedure date:  07/14/2003  Findings:      Hepatic flexure inflammation PATH = suspect ischemic colitis Sigmoid diverticulosis Internal hemorrhoids  Dr. Yates Decamp [facility] [place]  EGD  Procedure date:  07/14/2003  Findings:      Mild gatsritis Clotest negative   Current Medications (verified): 1)  Vytorin 10-40 Mg Tabs (Ezetimibe-Simvastatin) .... Once Daily 2)  Toprol Xl 25 Mg Xr24h-Tab (Metoprolol Succinate) .... Once Daily 3)  Hydrochlorothiazide 25 Mg Tabs (Hydrochlorothiazide) .... Once Daily 4)  Motrin Ib 200 Mg Tabs (Ibuprofen) .... 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  Daily Caffeine Use -6 Diet Cokes/day Illicit Drug Use - no Smoking Status:  never Drug Use:  no  Review of Systems       The patient complains of allergy/sinus, arthritis/joint pain, change in vision, heart rhythm changes, and swelling of feet/legs.         presbyopia All other ROS negative except as per HPI.   Vital Signs:  Patient profile:   55 year old female Height:      67 inches Weight:      248.38 pounds BMI:     39.04 Pulse rate:   84 / minute Pulse rhythm:   regular BP sitting:   128 / 84  (left arm) Cuff size:   large  Vitals Entered By: June McMurray CMA Duncan Dull) (August 22, 2009 8:44 AM)  Physical Exam  General:  healthy appearing and obese.   Eyes:  PERRLA, no icterus. Mouth:  No deformity or lesions, dentition normal. Neck:  Supple; no masses or thyromegaly. Lungs:  Clear throughout to auscultation. Heart:  Regular rate and rhythm; no murmurs, rubs,  or bruits. Abdomen:  obese, soft and non-tender BS+ no hsm/MASS Rectal:  deferred until time of colonoscopy.   Extremities:  No clubbing, cyanosis, edema or deformities noted. Neurologic:  Alert and  oriented x4; Cervical Nodes:  No significant cervical or supraclavicular adenopathy.  Psych:  Alert and cooperative. Normal mood and affect.   Impression & Recommendations:  Problem # 1:  DIARRHEA (ICD-787.91) Assessment New Chronic, ? IBD,  microscopic colitis. Not sure what patchy colitis at hepatic flexure in 2005 was. No random bxs then. Celiac is possible also. Chinese fugal extract (designed to treat intestinal Candida) has helped tremendously. Not sure what to make of that. It seems to be a priobiotic. Risks, benefits,and indications of endoscopic procedure(s) were reviewed with the patient and all questions answered.   Orders: T-Tissue Transglutamase Ab IgA [id number]) Colonoscopy (Colon) TLB-IgA (Immunoglobulin A) ([id number])  Problem # 2:  FULL INCONTINENCE OF FECES (ICD-787.60) Assessment: New Associated with urgent call to stool and diarrhe. Fortunately better on the Chinese Fungal Extract.  Problem # 3:  OBESITY (ICD-278.00) Assessment: New discussed some she is aare and has tried changes in food intake not exercising and is planning to  Patient Instructions: 1)  Please go to the basement to have your lab tests drawn today.  2)  Please pick up your medications at your pharmacy. MOVIPREP 3)  We will see you at your procedure on 09/19/09.  If this day does not work for you please call our office to reschedule. 4)  You need to lose weight. Start by limiting portions, amounts. Avoid eating when not hungry. Limit desserts.Look for high fructose corn syrup on food labels and if in first 3 ingredients, avoid that food. Also try to eat whole grains, avoid "white foods" (e.g. white rice, white bread).   5)  Reduce caffeine intake and diet soda intake. 6)  You should try to exercise more. Starting with a walking program 20-30 minutes daily or several tmes a week and gradually increasing can help. 7)  Copy sent to : Dierdre Forth, MD and Montey Hora, MD 8)  [facility] Patient Information Guide given to patient.  9)  Colonoscopy and Flexible Sigmoidoscopy brochure given.  10)  The medication list was reviewed and reconciled.  All changed / newly prescribed medications were explained.  A complete  medication list was provided to the patient / caregiver. Prescriptions: MOVIPREP 100 GM  SOLR (PEG-KCL-NACL-NASULF-NA ASC-C) As per prep instructions.  #1 x 0   Entered by:   Francee Piccolo CMA (AAMA)   Authorized by:   Iva Boop MD, [facility]   Signed by:   Francee Piccolo CMA (AAMA) on 08/22/2009   Method used:   Electronically to        [facility]* (retail)       [address]       [address] [place] Shipping/mailing       [place], [place]  [address]       Ph: [phone]       Fax: [phone]   RxID:   [id number]

## 2010-06-19 NOTE — Miscellaneous (Signed)
Summary: [facility]  [facility]   Imported By: Marily Memos 01/10/2010 08:52:37  _____________________________________________________________________  External Attachment:    Type:   Image     Comment:   External Document

## 2010-07-31 ENCOUNTER — Encounter: Payer: Self-pay | Admitting: *Deleted

## 2010-08-08 LAB — BASIC METABOLIC PANEL
BUN: 15 mg/dL (ref 6–23)
Calcium: 9.6 mg/dL (ref 8.4–10.5)
Glucose, Bld: 133 mg/dL — ABNORMAL HIGH (ref 70–99)
Potassium: 4.1 mEq/L (ref 3.5–5.1)
Sodium: 139 mEq/L (ref 135–145)

## 2010-08-08 LAB — CBC
Hemoglobin: 14.7 g/dL (ref 12.0–15.0)
MCHC: 33.8 g/dL (ref 30.0–36.0)
MCV: 84.3 fL (ref 78.0–100.0)
Platelets: 251 10*3/uL (ref 150–400)
RBC: 5.17 MIL/uL — ABNORMAL HIGH (ref 3.87–5.11)
WBC: 6.9 10*3/uL (ref 4.0–10.5)

## 2010-10-05 NOTE — H&P (Signed)
NAME:  Alexandra Walker, HAEFELE              ACCOUNT NO.:  [id number]   MEDICAL RECORD NO.:  [id number]          PATIENT TYPE:  AMB   LOCATION:  SDC                           FACILITY:  [facility]   PHYSICIAN:  Hal Morales, M.D.DATE OF BIRTH:  08-18-1955   DATE OF ADMISSION:  DATE OF DISCHARGE:                                HISTORY & PHYSICAL   HISTORY OF PRESENT ILLNESS:  Alexandra Walker is a 55 year old white female para  0-0-1-0 who presents for a hysteroscopy with D&C because of symptomatic  endometrial polyps. For the past 6-12 months the patient has experienced  several days of mid cycle bleeding monthly which requires her to wear a pad.  She denies any cramping, fever, changes in bowel habits, urinary tract  symptoms, or vaginitis symptoms. The patient's menstrual flow remains  regular, occurring monthly, lasting for 5 days, and requiring her to change  a pad every 4 hours. A sonohysterogram in November 2006 revealed a fundal  fibroid measuring 1.32 x 1.4 x 1.4 cm and an endometrial polyp measuring 0.7  x 0.7 x 0.8 cm. The patient has consented to proceed with resection of this  symptomatic endometrial polyp.   PAST MEDICAL HISTORY:  Obstetric history:  Gravida 1 para 0-0-1-0 (the  patient does have one adopted daughter).   GYN history:  Menarche 24 years old. Last menstrual period May 12, 2005. She uses abstinence as a method of contraception. 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 The patient underwent a cone biopsy in 2003. Her last Pap  smear which was normal was December 2005. Last normal mammogram November  2006.   Medical history:  1.  Hypertension.  2.  Prediabetes.  3.  Menstrual migraines.  4.  Irritable bowel syndrome.  5.  Hypercholesterolemia.  6.  Endometrial polyp.  7.  Infertility.  8.  Positive for HLA-B27 antibody.  9.  Positive for anticardiolipin antibodies.  10. Benign liver hyperplasia.  11. Seronegative spondylarthritis.   Surgical history:  1.  In  1994, radial keratotomy.  2.  In 1997, laparoscopy with hysteroscopy and endometrial polyp resection.  3.  In 1998, D&C for missed AB.  4.  In 1999, diagnostic laparoscopy for infertility.  5.  In 1999, liver biopsy (benign hyperplasia).  6.  In 2003, cold knife conization (mild squamous dysplasia).  7.  In 2003, left breast biopsy (fibroadenoma).  8.  In 2004, transobturator sling placement.  9.  In 2006, right breast core needle biopsy (fibroadenoma).   The patient states that anesthesia causes severe nausea and vomiting. She  denies any history of blood transfusions.   FAMILY HISTORY:  Positive for cardiovascular disease, hypertension, venous  thromboembolic event, non-insulin-dependent diabetes, cerebrovascular  accident, joint problems, cancer, and hypercholesterolemia.   SOCIAL HISTORY:  The patient is a Immunologist.   HABITS:  She does not use alcohol or tobacco.   CURRENT MEDICATIONS:  1.  Toprol-XL 20 mg daily.  2.  Hydrochlorothiazide 25 mg daily.  3.  Vytorin one tablet daily.  4.  Claritin 10 mg daily.  5.  Motrin 600 mg as needed.  6.  Multivitamin one tablet daily.  7.  Flax seed oil one tablet daily.  8.  Augmentin 500/125 one tablet every 12 hours (the patient is on day #2 of      10-day course for otitis media).   ALLERGIES:  The patient is allergic to PERCOCET and VICODIN, both of which  cause severe vomiting. She also has a reactive to ADHESIVES.   REVIEW OF SYSTEMS:  The patient does wear reading glasses and she has a  resolving left otitis media. Otherwise, review of systems is negative except  as mentioned in history of present illness.   PHYSICAL EXAMINATION:  VITAL SIGNS:  Blood pressure 120/78, height is 5 feet  7 inches tall, weight (December 2005) 257 pounds.  GENERAL:  Ear, noses, and throat within normal limits.  NECK:  Supple without masses. There is no thyromegaly or adenopathy.  HEART:  Regular rate and rhythm. There is no murmur.  LUNGS:  Clear  to auscultation. There are no wheezes, rales or rhonchi.  BACK:  No CVA tenderness.  ABDOMEN:  Bowel sounds are present. It is soft without tenderness, guarding,  rebound, or organomegaly.  EXTREMITIES:  Without clubbing, cyanosis, or edema.  PELVIC:  EG/BUS is within normal limits. The vagina is rugous. The patient  does have menstrual blood present. The cervix is nontender without lesions.  The uterus appears normal size, shape, and consistency without tenderness.  Adnexa without tenderness or masses. Rectovaginal without tenderness or  masses.   IMPRESSION:  1.  Intermenstrual bleeding.  2.  Endometrial polyp.  3.  Resolving left otitis media.   DISPOSITION:  A discussion was held with the patient regarding the  indications for her procedure, along with its risk which include but are not  limited to reactive to anesthesia, damage to adjacent organs, excessive  bleeding, and infection. The patient has consented to proceed with  hysteroscopy, D&C, and resection of endometrial polyp at [facility]  [place] on May 27, 2005, at 2 p.m.      Alexandra Walker.      Hal Morales, M.D.  Electronically Signed    EJP/MEDQ  D:  05/16/2005  T:  05/16/2005  Job:  [id number]

## 2010-10-05 NOTE — Op Note (Signed)
NAME:  Alexandra Walker, Alexandra Walker              ACCOUNT NO.:  [id number]   MEDICAL RECORD NO.:  [id number]          PATIENT TYPE:  AMB   LOCATION:  SDC                           [facility]ACILITY:  [facility]   PHYSICIAN:  Hal Morales, M.D.DATE OF BIRTH:  [birth year]   DATE OF PROCEDURE:  05/25/2005  DATE OF DISCHARGE:                                 OPERATIVE REPORT   PREOPERATIVE DIAGNOSES:  1.  Intermenstrual bleeding.  2.  Endometrial polyp.   POSTOPERATIVE DIAGNOSES:  1.  Intermenstrual bleeding.  2.  Endometrial polyp.   OPERATION:  Diagnostic hysteroscopy, polypectomy, dilatation and curettage.   SURGEON:  Hal Morales, M.D.   ANESTHESIA:  General LMA.   ESTIMATED BLOOD LOSS:  Less than 10 mL.   COMPLICATIONS:  None.   FINDINGS:  The patient had an endometrial polyp attached to the anterior  uterine wall and normal-appearing endometrium otherwise.   PROCEDURE:  The patient was taken to the operating room after appropriate  identification and placed on the operating table.  After the attainment of  adequate general anesthesia, she was placed in the lithotomy position.  A  Graves speculum was placed in the vagina and a Pap smear obtained per  patient request and sent to [facility] for submission.  The  Graves speculum was removed and the perineum and vagina prepped with  multiple layers of Betadine, then draped as a sterile field.  A red Robinson  catheter was used to empty the bladder.  A single-tooth tenaculum was placed  on the anterior cervix and a paracervical block achieved with a total of 10  mL of 2% Xylocaine.  The uterus was sounded to 9 cm.  The cervix was dilated  to a #23 dilator, which would accommodate the diagnostic hysteroscope.  This  was used to document the above-noted findings.  The hysteroscope was removed  and the Randall stone forceps used to remove the polyp.  The hysteroscope  was reinserted to document polyp removal and the endometrial cavity  curetted  in all four quadrants.  All instruments were then removed from the vagina.  Hemostasis was noted to be adequate.  The patient was awakened from general  anesthesia and taken to the  recovery room in satisfactory condition having tolerated the procedure well,  with sponge and instrument counts correct.   SPECIMENS TO PATHOLOGY:  Endometrial polyp and endometrial curetting .      Hal Morales, M.D.  Electronically Signed     VPH/MEDQ  D:  05/25/2005  T:  05/25/2005  Job:  [id number]

## 2011-01-22 ENCOUNTER — Encounter (HOSPITAL_BASED_OUTPATIENT_CLINIC_OR_DEPARTMENT_OTHER)
Admission: RE | Admit: 2011-01-22 | Discharge: 2011-01-22 | Disposition: A | Discharge status: Home / Self Care | Payer: 59 | Source: Ambulatory Visit | Attending: Orthopedic Surgery | Admitting: Orthopedic Surgery

## 2011-01-22 LAB — BASIC METABOLIC PANEL
BUN: 13 mg/dL (ref 6–23)
CO2: 28 mEq/L (ref 19–32)
Calcium: 9 mg/dL (ref 8.4–10.5)
Creatinine, Ser: 0.59 mg/dL (ref 0.50–1.10)
Glucose, Bld: 124 mg/dL — ABNORMAL HIGH (ref 70–99)
Potassium: 3.5 mEq/L (ref 3.5–5.1)

## 2011-01-24 ENCOUNTER — Other Ambulatory Visit: Payer: Self-pay | Admitting: Sports Medicine

## 2011-01-25 ENCOUNTER — Ambulatory Visit (HOSPITAL_BASED_OUTPATIENT_CLINIC_OR_DEPARTMENT_OTHER)
Admission: RE | Admit: 2011-01-25 | Discharge: 2011-01-25 | Disposition: A | Discharge status: Home / Self Care | Payer: 59 | Source: Ambulatory Visit | Attending: Orthopedic Surgery | Admitting: Orthopedic Surgery

## 2011-01-25 ENCOUNTER — Other Ambulatory Visit: Payer: Self-pay | Admitting: Orthopedic Surgery

## 2011-01-25 DIAGNOSIS — E669 Obesity, unspecified: Secondary | ICD-10-CM | POA: Insufficient documentation

## 2011-01-25 DIAGNOSIS — I10 Essential (primary) hypertension: Secondary | ICD-10-CM | POA: Insufficient documentation

## 2011-01-25 DIAGNOSIS — M65839 Other synovitis and tenosynovitis, unspecified forearm: Secondary | ICD-10-CM | POA: Insufficient documentation

## 2011-01-25 DIAGNOSIS — Z01810 Encounter for preprocedural cardiovascular examination: Secondary | ICD-10-CM | POA: Insufficient documentation

## 2011-01-25 DIAGNOSIS — E119 Type 2 diabetes mellitus without complications: Secondary | ICD-10-CM | POA: Insufficient documentation

## 2011-01-25 DIAGNOSIS — Z01812 Encounter for preprocedural laboratory examination: Secondary | ICD-10-CM | POA: Insufficient documentation

## 2011-01-25 DIAGNOSIS — M720 Palmar fascial fibromatosis [Dupuytren]: Secondary | ICD-10-CM | POA: Insufficient documentation

## 2011-01-26 NOTE — Op Note (Signed)
NAMEMarland Kitchen  ENJOLI, TIDD NO.:  [id number]  MEDICAL RECORD NO.:  [id number]  LOCATION:                                 FACILITY:  PHYSICIAN:  Dionne Ano. Crystle Carelli, M.D.     DATE OF BIRTH:  DATE OF PROCEDURE: DATE OF DISCHARGE:                              OPERATIVE REPORT   PREOPERATIVE DIAGNOSIS:  Left ring finger hand mass with associated mechanical symptoms about the A1 pulley interface secondary to tenosynovitis about the flexor digitorum profundus, flexor digitorum superficialis tendon apparatus.  POSTOPERATIVE DIAGNOSIS:  Left ring finger hand mass with associated mechanical symptoms about the A1 pulley interface secondary to tenosynovitis about the flexor digitorum profundus, flexor digitorum superficialis tendon apparatus.  PROCEDURES: 1. Subtotal palmar fasciectomy of left hand in line with the ring     finger including portions of the ring finger. 2. A1 pulley release with associated extensor tenosynovectomy of the     flexor digitorum profundus and flexor digitorum superficialis     tendons, right ring finger.  SURGEON:  Dionne Ano. Amanda Pea, MD  ASSISTANT:  None.  COMPLICATIONS:  None.  ANESTHESIA:  General endotracheal.  TOURNIQUET TIME:  Less than 30 minutes.  INDICATIONS FOR PROCEDURE:  This is a 55 year old female who presents with above-mentioned diagnosis.  We have counseled him in regards to risks and benefits of surgery including risks of infection, bleeding, anesthesia, damage to normal structures, and failure of surgery to accomplish its intended goals of relieving and restoring function.  With this in mind, she decided to proceed.  All questions have been encouraged and answered preoperatively.  OPERATIVE PROCEDURE:  The patient was seen by myself and Anesthesia, taken to the operating suite, and underwent smooth induction of general LMA anesthetic.  Preop nausea precautions and intraoperative nausea precautions were adhered to.   Following this, the patient was prepped and draped in the usual sterile fashion.  A time-out was called. Following this, transverse modified Brunner incision was made in the distal palmar crease.  Dissection was carried down and a septal to palmar fasciectomy was accomplished circumferentially, identified a mass less than 2 cm in nature but proximally 1.5 cm or so.  This was a mass consistent with Dupuytren disease.  It was circumferentially dissected to the pretendinous cord and the natatory cords as well as deep palmar investments were removed in their entirety, sent for specimen, and intraoperative photos were taken.  Following this, the patient had marked tenosynovitis about the FDP and FDS complex with a grinding-grating feeling against the A1 pulley. Thus, we performed A1 pulley release and tenosynovectomy of the FDP and FDS tendons extensive in nature.  Once this was done, I irrigated it copiously, secured hemostasis with bipolar electrocautery, and closed it with interrupted 5-0 Prolene.  We are going to ask her to elevate and move the fingers frequently, notify me should any problems occur, Percocet for pain, vitamin C, Peri-Colace.  Standard postop algorithm will be adhered to.  She has my cellular phone.  If there is any problems in addition to this, she is going to see [name] back in the office in 1 week for dressing change.  She was dressed sterilely, had excellent  refill.  No complicating features.  All sponge, needle, and instrument counts were reported as correct.     Dionne Ano. Amanda Pea, M.D.     [facility]  D:  01/25/2011  T:  01/25/2011  Job:  [id number]  Electronically Signed by Dominica Severin M.D. on 01/26/2011 04:36:04 PM

## 2011-06-28 ENCOUNTER — Other Ambulatory Visit: Payer: Self-pay | Admitting: Obstetrics and Gynecology

## 2011-06-28 DIAGNOSIS — Z1231 Encounter for screening mammogram for malignant neoplasm of breast: Secondary | ICD-10-CM

## 2011-07-05 ENCOUNTER — Ambulatory Visit
Admission: RE | Admit: 2011-07-05 | Discharge: 2011-07-05 | Disposition: A | Discharge status: Home / Self Care | Payer: 59 | Source: Ambulatory Visit | Attending: Obstetrics and Gynecology | Admitting: Obstetrics and Gynecology

## 2011-07-05 DIAGNOSIS — Z1231 Encounter for screening mammogram for malignant neoplasm of breast: Secondary | ICD-10-CM

## 2011-07-16 ENCOUNTER — Ambulatory Visit (INDEPENDENT_AMBULATORY_CARE_PROVIDER_SITE_OTHER): Payer: 59 | Admitting: Obstetrics and Gynecology

## 2011-07-16 DIAGNOSIS — R87612 Low grade squamous intraepithelial lesion on cytologic smear of cervix (LGSIL): Secondary | ICD-10-CM

## 2011-07-16 DIAGNOSIS — Z01419 Encounter for gynecological examination (general) (routine) without abnormal findings: Secondary | ICD-10-CM

## 2011-07-17 ENCOUNTER — Other Ambulatory Visit (INDEPENDENT_AMBULATORY_CARE_PROVIDER_SITE_OTHER): Payer: 59

## 2011-07-17 DIAGNOSIS — N949 Unspecified condition associated with female genital organs and menstrual cycle: Secondary | ICD-10-CM

## 2011-09-19 ENCOUNTER — Ambulatory Visit (INDEPENDENT_AMBULATORY_CARE_PROVIDER_SITE_OTHER): Payer: 59 | Admitting: Cardiovascular Disease

## 2011-09-19 DIAGNOSIS — I471 Supraventricular tachycardia, unspecified: Secondary | ICD-10-CM | POA: Insufficient documentation

## 2011-09-19 DIAGNOSIS — R079 Chest pain, unspecified: Secondary | ICD-10-CM | POA: Insufficient documentation

## 2011-09-19 DIAGNOSIS — I491 Atrial premature depolarization: Secondary | ICD-10-CM

## 2011-09-19 NOTE — Assessment & Plan Note (Signed)
Well controlled with Toprol. No changes.

## 2011-09-19 NOTE — Patient Instructions (Signed)
Your physician wants you to follow-up in:  6 months. You will receive a reminder letter in the mail two months in advance. If you don't receive a letter, please call our office to schedule the follow-up appointment.   

## 2011-09-19 NOTE — Assessment & Plan Note (Signed)
She has chronic, atypical chest pains with negative stress tests in 2003, 2005 and 2009. Her risk factors for CAD include HTN, HLD, borderline DM and a strong family history of CAD. For now, will continue risk factor reduction. Her BP is at goal. Lipids are at goal and managed by primary care. If she has any change in her typical chest pain or has associated dyspnea with the chest pain, we will plan further ischemic testing which will most likely be a cardiac cath.

## 2011-09-19 NOTE — Progress Notes (Signed)
   History of Present Illness: 56 yo WF with history of HTN, HLD, PACs, irritable bowel syndrome who is here today to establish cardiology care. She has been followed in [facility] with Dr. Luberta Robertson. She has had several stress tests that were normal with negative stress echo in 2003, 2005 and a normal stress myoview in 2009. She was recently diagnosed with a kyphosis. She describes chronic chest pain. She has history of PACs that are controlled with Toprol. 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 Her daughter is now 49. She has glucose intolerance. Carotid artery dopplers were normal 12/07.   She tells me that she has been doing well. No changes in her breathing. No change in occasional chest pains. She has chronic lower extremity edema, no changes.   Primary Care Physician:  Elyn Peers [facility])  Last Lipid Profile:  Followed in primary care.   Past Medical History  Diagnosis Date  . Hypertension   . Hyperlipidemia   . IBS (irritable bowel syndrome)   . Glucose intolerance (impaired glucose tolerance)   . PAC (premature atrial contraction)     Past Surgical History  Procedure Date  . Abdominal laparascopic   . Dilation and curettage of uterus   . Left finger surgery     Current Outpatient Prescriptions  Medication Sig Dispense Refill  . hydrochlorothiazide 25 MG tablet Once daily       . ibuprofen (MOTRIN IB) 200 MG tablet as needed.        . metoprolol succinate (TOPROL XL) 25 MG 24 hr tablet Once daily       . simvastatin (ZOCOR) 40 MG tablet Take 40 mg by mouth every evening.      . traMADol (ULTRAM) 50 MG tablet Take 50 mg by mouth every 6 (six) hours as needed.          Allergies  Allergen Reactions  . Percocet (Oxycodone-Acetaminophen)   . Vicodin (Hydrocodone-Acetaminophen)     History   Social History  . Marital Status: Divorced    Spouse Name: N/A    Number of Children: 1  . Years of Education: N/A    Occupational History  . [employer] [place]   Social History Main Topics  . Smoking status: Never Smoker   . Smokeless tobacco: Not on file  . Alcohol Use: No  . Drug Use: No  . Sexually Active: Not on file   Other Topics Concern  . Not on file   Social History Narrative  . No narrative on file    Family History  Problem Relation Age of Onset  . Heart attack Father   . Coronary artery disease Mother   . Hypertension Sister   . Hypertension Brother     Review of Systems:  As stated in the HPI and otherwise negative.   BP 120/88  Pulse 66  Ht 5\' 7"  (1.702 m)  Wt 256 lb (116.121 kg)  BMI 40.10 kg/m2  Physical Examination: General: Well developed, well nourished, NAD HEENT: OP clear, mucus membranes moist SKIN: warm, dry. No rashes. Neuro: No focal deficits Musculoskeletal: Muscle strength 5/5 all ext Psychiatric: Mood and affect normal Neck: No JVD, no carotid bruits, no thyromegaly, no lymphadenopathy. Lungs:Clear bilaterally, no wheezes, rhonci, crackles Cardiovascular: Regular rate and rhythm. No murmurs, gallops or rubs. Abdomen:Soft. Bowel sounds present. Non-tender.  Extremities: Trace bilateral lower extremity edema. Pulses are 2 + in the bilateral DP/PT.

## 2011-10-13 ENCOUNTER — Emergency Department (HOSPITAL_BASED_OUTPATIENT_CLINIC_OR_DEPARTMENT_OTHER)
Admission: EM | Admit: 2011-10-13 | Discharge: 2011-10-14 | Disposition: A | Discharge status: Home / Self Care | Payer: 59 | Attending: Emergency Medicine | Admitting: Emergency Medicine

## 2011-10-13 ENCOUNTER — Encounter (HOSPITAL_BASED_OUTPATIENT_CLINIC_OR_DEPARTMENT_OTHER): Payer: Self-pay | Admitting: *Deleted

## 2011-10-13 DIAGNOSIS — W5501XA Bitten by cat, initial encounter: Secondary | ICD-10-CM

## 2011-10-13 DIAGNOSIS — IMO0001 Reserved for inherently not codable concepts without codable children: Secondary | ICD-10-CM | POA: Insufficient documentation

## 2011-10-13 DIAGNOSIS — Z23 Encounter for immunization: Secondary | ICD-10-CM | POA: Insufficient documentation

## 2011-10-13 DIAGNOSIS — E785 Hyperlipidemia, unspecified: Secondary | ICD-10-CM | POA: Insufficient documentation

## 2011-10-13 DIAGNOSIS — S61409A Unspecified open wound of unspecified hand, initial encounter: Secondary | ICD-10-CM | POA: Insufficient documentation

## 2011-10-13 DIAGNOSIS — K589 Irritable bowel syndrome without diarrhea: Secondary | ICD-10-CM | POA: Insufficient documentation

## 2011-10-13 DIAGNOSIS — I10 Essential (primary) hypertension: Secondary | ICD-10-CM | POA: Insufficient documentation

## 2011-10-13 MED ORDER — RABIES IMMUNE GLOBULIN 150 UNIT/ML IM INJ
20.0000 [IU]/kg | INJECTION | Freq: Once | INTRAMUSCULAR | Status: AC
  Administered 2011-10-14: 2250 [IU]
  Filled 2011-10-13: qty 6

## 2011-10-13 MED ORDER — RABIES VACCINE, PCEC IM SUSR
1.0000 mL | Freq: Once | INTRAMUSCULAR | Status: AC
  Administered 2011-10-14: 1 mL via INTRAMUSCULAR
  Filled 2011-10-13: qty 1

## 2011-10-13 NOTE — ED Notes (Signed)
Pt states that she does not know the animal that bit her and has been unable to locate the animal.

## 2011-10-13 NOTE — ED Provider Notes (Signed)
History   This chart was scribed for Ethelda Chick, MD by Charolett Bumpers . The patient was seen in room [facility]HH2/[facility].    CSN: [id number]  Arrival date & time 10/13/11  2044   First MD Initiated Contact with Patient 10/13/11 2118      Chief Complaint  Patient presents with  . Animal Bite    (Consider location/radiation/quality/duration/timing/severity/associated sxs/prior treatment) HPI Alexandra Walker is a 56 y.o. female who presents to the Emergency Department complaining of constant, mild pain associated with an animal bite on her right hand that occurred 2 days ago. Patient states that she was bitten by a stray cat on Friday and received 2 superficial, puncture wounds on her right hand. Patient states that she seen Friday by her PA and placed on an abx (Augmentin). Patient states that she came to the ED to start the rabies series. Patient denies any significant bleeding. Patient denies any associated symptoms. No pertinent medical or surgical hx reported.   Past Medical History  Diagnosis Date  . Hypertension   . Hyperlipidemia   . IBS (irritable bowel syndrome)   . Glucose intolerance (impaired glucose tolerance)   . PAC (premature atrial contraction)     Past Surgical History  Procedure Date  . Abdominal laparascopic   . Dilation and curettage of uterus   . Left finger surgery     Family History  Problem Relation Age of Onset  . Heart attack Father   . Coronary artery disease Mother   . Hypertension Sister   . Hypertension Brother     History  Substance Use Topics  . Smoking status: Never Smoker   . Smokeless tobacco: Not on file  . Alcohol Use: No    OB History    Grav Para Term Preterm Abortions TAB SAB Ect Mult Living                  Review of Systems A complete 10 system review of systems was obtained and all systems are negative except as noted in the HPI and PMH.   Allergies  Percocet and Vicodin  Home Medications   Current  Outpatient Rx  Name Route Sig Dispense Refill  . AMOXICILLIN-POT CLAVULANATE 875-125 MG PO TABS Oral Take 1 tablet by mouth 2 (two) times daily.    Marland Kitchen VITAMIN D 1000 UNITS PO TABS Oral Take 1,000 Units by mouth daily.    Marland Kitchen GREEN COFFEE BEAN PO Oral Take 1 tablet by mouth daily.    Marland Kitchen HYDROCHLOROTHIAZIDE 25 MG PO TABS  Once daily     . IBUPROFEN 200 MG PO TABS  as needed.      Marland Kitchen ONE-DAILY MULTI VITAMINS PO TABS Oral Take 1 tablet by mouth daily.    Marland Kitchen FISH OIL PO Oral Take 1 tablet by mouth daily.    Marland Kitchen SIMVASTATIN 40 MG PO TABS Oral Take 40 mg by mouth every evening.    Marland Kitchen METOPROLOL SUCCINATE ER 25 MG PO TB24  Once daily       BP 151/73  Pulse 78  Temp(Src) 98.6 F (37 C) (Oral)  Resp 20  Ht 5\' 7"  (1.702 m)  Wt 250 lb (113.399 kg)  BMI 39.16 kg/m2  SpO2 97%  Physical Exam  Nursing note and vitals reviewed. Constitutional: She is oriented to person, place, and time. She appears well-developed and well-nourished. No distress.  HENT:  Head: Normocephalic and atraumatic.  Eyes: EOM are normal. Pupils are equal, round, and reactive  to light.  Neck: Neck supple. No tracheal deviation present.  Cardiovascular: Normal rate.   Pulmonary/Chest: Effort normal. No respiratory distress.  Abdominal: Soft. She exhibits no distension.  Musculoskeletal: Normal range of motion. She exhibits no edema.  Neurological: She is alert and oriented to person, place, and time. No sensory deficit.  Skin: Skin is warm and dry.       2 superficial puncture marks on the thenar eminence of the right hand. No surrounding erythema. No significant tenderness.   Psychiatric: She has a normal mood and affect. Her behavior is normal.    ED Course  Procedures (including critical care time)  DIAGNOSTIC STUDIES: Oxygen Saturation is 97% on room air, normal by my interpretation.    COORDINATION OF CARE:  2150: Discussed planned course of treatment with the patient, who is agreeable at this time. Discussed the need  for an x-ray and the patient declined. Will start the rabies series.  2215: Medication Orders: Rabies vaccine, PCEC (Rabavert) injection 1 mL-once; Rabies immune globulin (Hyperab) injection 2,250 units-once.   11:15 PM Pt is awaiting rabies meds- they are being transported from [facility].    Labs Reviewed - No data to display No results found.   1. Cat bite       MDM  Pt presents with 2 puncture wounds to right hand after being bitten by a stray cat 2 days ago.  She was referred by her PMD to start rabies series.  She has declined xray to evaluate for retained tooth.  PMD had alerady startedher on augmentin.  There is no erythematous streaking of her hand/arm.  Pt discharged with strict return precautions.  She was agreeable with this plan.   I personally performed the services described in this documentation, which was scribed in my presence. The recorded information has been reviewed and considered.        Ethelda Chick, MD 10/14/11 [phone]

## 2011-10-13 NOTE — ED Notes (Signed)
Pt states she was bitten by a stray cat Friday. Puncture wounds x 2 to right hand.

## 2011-10-14 NOTE — ED Notes (Signed)
Flow manager advised of pt's need for rabies series

## 2011-10-14 NOTE — ED Notes (Signed)
D/c home with schedule for rabies f/u and contact # for Urgent Care

## 2011-10-14 NOTE — Discharge Instructions (Signed)
Return to the ED with any concerns including increased pain or redness, swelling, drainage from wound, fever, or any other alarming symptoms  You should follow up according to discharge paperwork to continue vaccination series.

## 2011-10-17 ENCOUNTER — Emergency Department (HOSPITAL_COMMUNITY)
Admission: EM | Admit: 2011-10-17 | Discharge: 2011-10-17 | Disposition: A | Discharge status: Home / Self Care | Payer: 59 | Source: Home / Self Care

## 2011-10-17 ENCOUNTER — Encounter (HOSPITAL_COMMUNITY): Payer: Self-pay | Admitting: *Deleted

## 2011-10-17 DIAGNOSIS — Z23 Encounter for immunization: Secondary | ICD-10-CM

## 2011-10-17 HISTORY — DX: Other seasonal allergic rhinitis: J30.2

## 2011-10-17 MED ORDER — RABIES VACCINE, PCEC IM SUSR
1.0000 mL | Freq: Once | INTRAMUSCULAR | Status: AC
  Administered 2011-10-17: 1 mL via INTRAMUSCULAR

## 2011-10-17 MED ORDER — RABIES VACCINE, PCEC IM SUSR
INTRAMUSCULAR | Status: AC
  Filled 2011-10-17: qty 1

## 2011-10-17 NOTE — ED Notes (Signed)
Here for 2nd rabies shot for cat bite to R hand. No signs of infection.

## 2011-10-17 NOTE — Discharge Instructions (Signed)
Call if any problems.  Return 6/3 @ 1130 for next vaccine.

## 2011-10-23 ENCOUNTER — Telehealth (HOSPITAL_COMMUNITY): Payer: Self-pay | Admitting: *Deleted

## 2011-10-23 ENCOUNTER — Emergency Department (HOSPITAL_COMMUNITY)
Admission: EM | Admit: 2011-10-23 | Discharge: 2011-10-23 | Disposition: A | Discharge status: Home / Self Care | Payer: 59 | Source: Home / Self Care

## 2011-10-23 ENCOUNTER — Encounter (HOSPITAL_COMMUNITY): Payer: Self-pay | Admitting: *Deleted

## 2011-10-23 DIAGNOSIS — Z23 Encounter for immunization: Secondary | ICD-10-CM

## 2011-10-23 MED ORDER — RABIES VACCINE, PCEC IM SUSR
INTRAMUSCULAR | Status: AC
  Filled 2011-10-23: qty 1

## 2011-10-23 MED ORDER — RABIES VACCINE, PCEC IM SUSR
1.0000 mL | Freq: Once | INTRAMUSCULAR | Status: AC
  Administered 2011-10-23: 1 mL via INTRAMUSCULAR

## 2011-10-23 NOTE — ED Notes (Signed)
Here for 3rd rabies vaccine for cat bite.  No previous problems with injections.

## 2011-10-23 NOTE — ED Notes (Signed)
Pt. called back and said Animal control told her on Mon. if the cat was still alive it probably was not rabid. Pt. has seen the cat. It was never caught or tested. I told her that was not real definitive. Discussed with Dr. Tressia Danas and she said the animal can live up to 3 weeks and it can incubate in humans up to a year. She recommends pt. complete the series to make sure she is covered.  Pt. given this information and decided she would complete the series and will come today. Vassie Moselle. 10/23/2011

## 2011-10-23 NOTE — ED Notes (Signed)
Pt. had called on 6/3 and said she would come in some time tomorrow. No record that pt. came on 6/4. I called and left a message for pt. to call to schedule her rabies shot.  I explained that they have to be completed in 14 days with a minimum of 3 days apart. Vassie Moselle 10/23/2011

## 2011-10-23 NOTE — Discharge Instructions (Signed)
Call if any problems. Return 6/10 @ 1130 for last vaccine.

## 2011-10-31 ENCOUNTER — Encounter (HOSPITAL_COMMUNITY): Payer: Self-pay | Admitting: *Deleted

## 2011-10-31 ENCOUNTER — Emergency Department (HOSPITAL_COMMUNITY)
Admission: EM | Admit: 2011-10-31 | Discharge: 2011-10-31 | Disposition: A | Discharge status: Home / Self Care | Payer: 59 | Source: Home / Self Care

## 2011-10-31 DIAGNOSIS — Z23 Encounter for immunization: Secondary | ICD-10-CM

## 2011-10-31 MED ORDER — RABIES VACCINE, PCEC IM SUSR
INTRAMUSCULAR | Status: AC
  Filled 2011-10-31: qty 1

## 2011-10-31 MED ORDER — RABIES VACCINE, PCEC IM SUSR
1.0000 mL | Freq: Once | INTRAMUSCULAR | Status: AC
  Administered 2011-10-31: 1 mL via INTRAMUSCULAR

## 2011-10-31 NOTE — Discharge Instructions (Signed)
Congratulations you have finished your rabies series.  Call if any problems.  If any further rabies exposures, go to the ED and get a rabies titer.  If low you would only need a booster shot.

## 2011-10-31 NOTE — ED Notes (Signed)
Here for last rabies vaccine cat bite R hand.  Arm soreness from last injection.

## 2012-03-18 ENCOUNTER — Encounter: Payer: Self-pay | Admitting: *Deleted

## 2012-03-23 ENCOUNTER — Ambulatory Visit (INDEPENDENT_AMBULATORY_CARE_PROVIDER_SITE_OTHER): Payer: 59 | Admitting: Internal Medicine

## 2012-03-23 ENCOUNTER — Encounter: Payer: Self-pay | Admitting: Internal Medicine

## 2012-03-23 VITALS — BP 126/84 | HR 72 | Ht 67.0 in | Wt 251.6 lb

## 2012-03-23 DIAGNOSIS — R1310 Dysphagia, unspecified: Secondary | ICD-10-CM

## 2012-03-23 DIAGNOSIS — M461 Sacroiliitis, not elsewhere classified: Secondary | ICD-10-CM

## 2012-03-23 DIAGNOSIS — R197 Diarrhea, unspecified: Secondary | ICD-10-CM

## 2012-03-23 MED ORDER — SOD PICOSULFATE-MAG OX-CIT ACD 10-3.5-12 MG-GM-GM PO PACK: 1.0000 | PACK | Freq: Once | ORAL | Status: DC

## 2012-03-23 NOTE — Progress Notes (Signed)
  Subjective:    Patient ID: Alexandra Walker, female    DOB: [birth year], 56 y.o.   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 At that time she was having abdominal pain and diarrhea problems. It was.that she probably had IBS but a previous colonoscopy had shown some inflammatory changes in the right colon, at procedure elsewhere. Biopsies have suggested ischemia. She continues with urgent defecation most days of the week, with progressive loosening of stools to watery. She has to leave the delivery room at times, she has had some incontinence issues at least several times a month urge incontinence. There is also severe anal and rectal pain spasms a few times a year. She has been on an herbal supplement called and intestinal fungus formula supply by her acupuncturist which has helped but she's not using that now. She never did schedule a colonoscopy is recommended at that time because she got dizzy with things going on in her life and did not schedule.  She does not see blood in her stool. She does have a history of sacroiliitis and HLA-B27 positive serology. She takes ibuprofen frequently though she has reduced that, she generally takes at least 600 mg most days. She has reduced caffeine consumption, she was drinking 8-10 Diet Cokes and  half to one a day. She does drink iced tea without sugar. She has not had unintentional weight loss.   she also has intermittent solid food dysphagia and some heartburn. This is new since the last visit. Medications, allergies, past medical history, past surgical history, family history and social history are reviewed and updated in the EMR.  Review of Systems  low back pain and sacroiliitis problems.    Objective:   Physical Exam General:  NAD Eyes:   anicteric Lungs:  clear Heart:  S1S2 no rubs, murmurs or gallops Abdomen:  soft and mildly tender diffusely, BS+ Ext:   no edema  Data Reviewed:  2005 EGD and  colonoscopy 20 11 GI note        Assessment & Plan:   1. Chronic diarrhea   2. Dysphagia   3. Sacroiliitis and HLA-B27 positive    1. IBD versus IBS. Colonoscopy with hopeful terminal ileal inspection and random biopsies. 2. 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 Dysmotility possible also. The risks and benefits as well as alternatives of endoscopic procedure(s) have been discussed and reviewed. All questions answered. The patient agrees to proceed If she had IBS I think Lotronex would be a good drug for her and introduced the concept.  I appreciate the opportunity to care for this patient.   CC: Bosie Clos, MD

## 2012-03-23 NOTE — Patient Instructions (Addendum)
You have been scheduled for an endoscopy and colonoscopy with propofol. Please follow the written instructions given to you at your visit today. Please use the prep-o-pik sample kit you have been given today. If you use inhalers (even only as needed) or a CPAP machine, please bring them with you on the day of your procedure.  Thank you for choosing me and [facility].  Iva Boop, M.D., [facility]

## 2012-03-31 ENCOUNTER — Emergency Department (HOSPITAL_BASED_OUTPATIENT_CLINIC_OR_DEPARTMENT_OTHER)
Admission: EM | Admit: 2012-03-31 | Discharge: 2012-03-31 | Disposition: A | Discharge status: Home / Self Care | Payer: 59 | Attending: Emergency Medicine | Admitting: Emergency Medicine

## 2012-03-31 ENCOUNTER — Encounter (HOSPITAL_BASED_OUTPATIENT_CLINIC_OR_DEPARTMENT_OTHER): Payer: Self-pay | Admitting: *Deleted

## 2012-03-31 DIAGNOSIS — E785 Hyperlipidemia, unspecified: Secondary | ICD-10-CM | POA: Insufficient documentation

## 2012-03-31 DIAGNOSIS — I491 Atrial premature depolarization: Secondary | ICD-10-CM | POA: Insufficient documentation

## 2012-03-31 DIAGNOSIS — M129 Arthropathy, unspecified: Secondary | ICD-10-CM | POA: Insufficient documentation

## 2012-03-31 DIAGNOSIS — I10 Essential (primary) hypertension: Secondary | ICD-10-CM | POA: Insufficient documentation

## 2012-03-31 DIAGNOSIS — J309 Allergic rhinitis, unspecified: Secondary | ICD-10-CM | POA: Insufficient documentation

## 2012-03-31 DIAGNOSIS — R002 Palpitations: Secondary | ICD-10-CM | POA: Insufficient documentation

## 2012-03-31 DIAGNOSIS — Z8719 Personal history of other diseases of the digestive system: Secondary | ICD-10-CM | POA: Insufficient documentation

## 2012-03-31 NOTE — ED Notes (Signed)
MD at bedside. 

## 2012-03-31 NOTE — ED Notes (Signed)
Pt denies palpitations since arrival to ED.

## 2012-03-31 NOTE — ED Notes (Signed)
C/o palpitations that started around 12 midnight, pt took today's dose of toprol around 1 am with no relief. Pt denies PC, no n/v.

## 2012-03-31 NOTE — ED Provider Notes (Signed)
History     CSN: [id number]  Arrival date & time 03/31/12  0223   First MD Initiated Contact with Patient 03/31/12 0248      Chief Complaint  Patient presents with  . Palpitations    (Consider location/radiation/quality/duration/timing/severity/associated sxs/prior treatment) HPI Comments: Ms.Mongiello presents ambulatory for evaluation of palpitations.  She states she felt a fluttering or irregular beat within her chest while laying in bed.  She thought it was her usual palpitations secondary to PACs.  That however is usually a transient event.  This persisted.  After a while she decided to take her morning dose of toprol.  2 hours after taking the medication, the pounding persisted.  She drove herself to the ER.  On arrival to the treatment room, she states it resolved.  She denied any caffeine or stimulant abuse, fever, chest pain, cough, NVD, leg pain/swelling, or travel within the last several days.  She reports she now feels well and silly for coming to the ER.  Patient is a 56 y.o. female presenting with palpitations. The history is provided by the patient. No language interpreter was used.  Palpitations  This is a recurrent problem. The current episode started 3 to 5 hours ago. The problem occurs constantly. The problem has been resolved. Associated symptoms include irregular heartbeat. Pertinent negatives include no diaphoresis, no fever, no malaise/fatigue, no numbness, no chest pain, no chest pressure, no claudication, no exertional chest pressure, no near-syncope, no orthopnea, no syncope, no abdominal pain, no nausea, no vomiting, no back pain, no leg pain, no lower extremity edema, no weakness, no cough and no shortness of breath. Improvement on treatment: taking the toprol early. Risk factors include obesity and diabetes mellitus.    Past Medical History  Diagnosis Date  . Hypertension   . Hyperlipidemia   . IBS (irritable bowel syndrome)   . Glucose intolerance (impaired  glucose tolerance)   . PAC (premature atrial contraction)   . Arthritis   . Seasonal allergies   . Sacroiliitis   . HLA B27 (HLA B27 positive)     Past Surgical History  Procedure Date  . Abdominal laparascopic   . Dilation and curettage of uterus   . Left finger surgery   . Colonoscopy w/ biopsies 2005    [facility]  . Esophagogastroduodenoscopy 2005    [facility]     Family History  Problem Relation Age of Onset  . Heart attack Father   . Heart disease Father   . Coronary artery disease Mother   . Hypertension Mother   . Heart disease Mother   . Hypertension Sister   . Hypertension Brother     History  Substance Use Topics  . Smoking status: Never Smoker   . Smokeless tobacco: Never Used  . Alcohol Use: No    OB History    Grav Para Term Preterm Abortions TAB SAB Ect Mult Living                  Review of Systems  Constitutional: Negative for fever, malaise/fatigue and diaphoresis.  Respiratory: Negative for cough and shortness of breath.   Cardiovascular: Positive for palpitations. Negative for chest pain, orthopnea, claudication, syncope and near-syncope.  Gastrointestinal: Negative for nausea, vomiting and abdominal pain.  Musculoskeletal: Negative for back pain.  Neurological: Negative for weakness and numbness.  All other systems reviewed and are negative.    Allergies  Percocet and Vicodin  Home Medications   Current Outpatient Rx  Name  Route  Sig  Dispense  Refill  . VITAMIN D 1000 UNITS PO TABS   Oral   Take 1,000 Units by mouth daily.         Marland Kitchen GREEN COFFEE BEAN PO   Oral   Take 1 tablet by mouth daily.         Marland Kitchen HYDROCHLOROTHIAZIDE 25 MG PO TABS      Once daily          . IBUPROFEN 200 MG PO TABS      as needed.           Marland Kitchen METFORMIN HCL 500 MG PO TABS   Oral   Take 1,000 mg by mouth 2 (two) times daily with a meal.         . METOPROLOL SUCCINATE ER 25 MG PO TB24      Once daily          . ONE-DAILY MULTI  VITAMINS PO TABS   Oral   Take 1 tablet by mouth daily.         Marland Kitchen FISH OIL PO   Oral   Take 1 tablet by mouth daily.         Marland Kitchen SIMVASTATIN 40 MG PO TABS   Oral   Take 40 mg by mouth every evening.         . SOD PICOSULFATE-MAG OX-CIT ACD 10-3.5-12 MG-GM-GM PO PACK   Oral   Take 1 kit by mouth once.   1 each   0     BP 141/77  Pulse 80  Temp 98.8 F (37.1 C) (Oral)  Resp 16  Ht 5\' 7"  (1.702 m)  Wt 250 lb (113.399 kg)  BMI 39.16 kg/m2  SpO2 97%  Physical Exam  Nursing note and vitals reviewed. Constitutional: She is oriented to person, place, and time. She appears well-developed and well-nourished. No distress.  HENT:  Head: Normocephalic and atraumatic.  Right Ear: External ear normal.  Left Ear: External ear normal.  Nose: Nose normal.  Mouth/Throat: Oropharynx is clear and moist. No oropharyngeal exudate.  Eyes: Conjunctivae normal are normal. Pupils are equal, round, and reactive to light. Right eye exhibits no discharge. Left eye exhibits no discharge. No scleral icterus.  Neck: Normal range of motion. Neck supple. No JVD present. No tracheal deviation present.  Cardiovascular: Normal rate, regular rhythm, S1 normal, S2 normal, normal heart sounds, intact distal pulses and normal pulses.   No extrasystoles are present. PMI is not displaced.  Exam reveals no gallop and no decreased pulses.   No murmur heard. Pulmonary/Chest: Effort normal and breath sounds normal. No stridor. No respiratory distress. She has no wheezes. She has no rales. She exhibits no tenderness.  Abdominal: Soft. Bowel sounds are normal. She exhibits no distension and no mass. There is no tenderness. There is no rebound and no guarding.  Musculoskeletal: Normal range of motion. She exhibits no edema and no tenderness.  Lymphadenopathy:    She has no cervical adenopathy.  Neurological: She is alert and oriented to person, place, and time.  Skin: Skin is warm and dry. No rash noted. She is  not diaphoretic. No erythema. No pallor.  Psychiatric: She has a normal mood and affect. Her behavior is normal.    ED Course  Procedures (including critical care time)  Labs Reviewed - No data to display No results found.   No diagnosis found.   Date: 03/31/2012  Rate: 76 bpm  Rhythm: sinus  QRS Axis: left  Intervals: PR prolonged  ST/T Wave abnormalities: nonspecific ST changes  Conduction Disutrbances:nonspecific intraventricular conduction delay, 1st degree AV block  Narrative Interpretation:   Old EKG Reviewed: unchanged      MDM  Pt presented for evaluation of palpitations.  She has a hx of symptomatic PACs, but they are usually a sporadic occurrence.  She had a sustained event tonight, however it resolved on arrival to the ER.  She denies any CP or SOB.  Clinically she has no evidence of thromboembolic event.  She has an ekg that has no acute changes.  She has been symptom free for almost 1.5 hrs.  She has PMD and cardiologist she can follow-up with.  Plan is for her to be discharged home.  If she has any further sustained palpitations or palpitations associated with pain, dyspnea, swelling, or fainting; she will return to the emergency department for an extensive evaluation.       Tobin Chad, MD 03/31/12 [phone]

## 2012-03-31 NOTE — ED Notes (Signed)
Pt SR on monitor, resps even and unlabored.

## 2012-05-18 ENCOUNTER — Ambulatory Visit (AMBULATORY_SURGERY_CENTER): Payer: 59 | Admitting: Internal Medicine

## 2012-05-18 ENCOUNTER — Encounter: Payer: Self-pay | Admitting: Internal Medicine

## 2012-05-18 VITALS — BP 134/79 | HR 57 | Temp 98.2°F | Resp 22 | Ht 67.0 in | Wt 251.0 lb

## 2012-05-18 DIAGNOSIS — Q393 Congenital stenosis and stricture of esophagus: Secondary | ICD-10-CM

## 2012-05-18 DIAGNOSIS — K222 Esophageal obstruction: Secondary | ICD-10-CM

## 2012-05-18 DIAGNOSIS — D126 Benign neoplasm of colon, unspecified: Secondary | ICD-10-CM

## 2012-05-18 DIAGNOSIS — R1310 Dysphagia, unspecified: Secondary | ICD-10-CM

## 2012-05-18 DIAGNOSIS — R197 Diarrhea, unspecified: Secondary | ICD-10-CM

## 2012-05-18 DIAGNOSIS — K5730 Diverticulosis of large intestine without perforation or abscess without bleeding: Secondary | ICD-10-CM

## 2012-05-18 DIAGNOSIS — Q391 Atresia of esophagus with tracheo-esophageal fistula: Secondary | ICD-10-CM

## 2012-05-18 LAB — GLUCOSE, CAPILLARY
Glucose-Capillary: 107 mg/dL — ABNORMAL HIGH (ref 70–99)
Glucose-Capillary: 113 mg/dL — ABNORMAL HIGH (ref 70–99)

## 2012-05-18 MED ORDER — SODIUM CHLORIDE 0.9 % IV SOLN: 500.0000 mL | INTRAVENOUS | Status: DC

## 2012-05-18 MED ORDER — OMEPRAZOLE 20 MG PO CPDR: 20.0000 mg | DELAYED_RELEASE_CAPSULE | Freq: Every day | ORAL | Status: DC

## 2012-05-18 NOTE — Op Note (Signed)
[facility] [address] [place], [address]   ENDOSCOPY PROCEDURE REPORT  PATIENT: Alexandra, Walker  MR#: [id number] BIRTHDATE: Dec 03, 1955 , 56  yrs. old GENDER: Female ENDOSCOPIST: Iva Boop, MD, Clementeen Graham REFERRED BY:  Montey Hora, M.D. PROCEDURE DATE:  05/18/2012 PROCEDURE:  EGD, diagnostic and Maloney dilation of esophagus ASA CLASS:     Class II INDICATIONS:  Dysphagia. MEDICATIONS: propofol (Diprivan) 350mg  IV, MAC sedation, administered by CRNA, and These medications were titrated to patient response per physician's verbal order TOPICAL ANESTHETIC: Cetacaine Spray  DESCRIPTION OF PROCEDURE: After the risks benefits and alternatives of the procedure were thoroughly explained, informed consent was obtained.  The FUSE Demo Scope endoscope was introduced through the mouth and advanced to the second portion of the duodenum. Without limitations.  The instrument was slowly withdrawn as the mucosa was fully examined.        ESOPHAGUS: A Schatzki ring was found at the gastroesophageal junction.  The remainder of the upper endoscopy exam was otherwise normal. Retroflexed views revealed no abnormalities.     The scope was then withdrawn from the patient, a 27 [place] Maloney dilator was passed without difficulty or heme and the procedure completed.  COMPLICATIONS: There were no complications. ENDOSCOPIC IMPRESSION: Schatzki ring was found at the gastroesophageal junction otherwise normal exam. 54 French Maloney dilator passed  RECOMMENDATIONS: Clear liquids until 10 AM  , then soft foods rest of day.  Resume prior diet tomorrow. Start PPI - omeprazole 20 mg daily - Rx sent colonoscopy next    eSigned:  Iva Boop, MD, [facility] 05/18/2012 9:03 AM   [id number] Dimple Casey, MD and The Patient

## 2012-05-18 NOTE — Progress Notes (Signed)
Called to room to assist during endoscopic procedure.  Patient ID and intended procedure confirmed with present staff. Received instructions for my participation in the procedure from the performing physician.  

## 2012-05-18 NOTE — Patient Instructions (Signed)
Colon polyp x 1 removed today, diverticulosis seen also. Try to follow high fiber diet. See handouts given.  Dilation diet today,see the handout. Start omeprazole, RX sent to your pharmacy today. Our office will call you with results. Call us with any questions or concerns. Resume current medications. Thank you!!   YOU HAD AN ENDOSCOPIC PROCEDURE TODAY AT THE [facility]: Refer to the procedure report that was given to you for any specific questions about what was found during the examination.  If the procedure report does not answer your questions, please call your gastroenterologist to clarify.  If you requested that your care partner not be given the details of your procedure findings, then the procedure report has been included in a sealed envelope for you to review at your convenience later.  YOU SHOULD EXPECT: Some feelings of bloating in the abdomen. Passage of more gas than usual.  Walking can help get rid of the air that was put into your GI tract during the procedure and reduce the bloating. If you had a lower endoscopy (such as a colonoscopy or flexible sigmoidoscopy) you may notice spotting of blood in your stool or on the toilet paper. If you underwent a bowel prep for your procedure, then you may not have a normal bowel movement for a few days.  DIET:  Start with Clear Liquid diet for 1 hour, then soft foods rest of day. Resume prior diet tomorrow.  Drink plenty of fluids but you should avoid alcoholic beverages for 24 hours.  ACTIVITY: Your care partner should take you home directly after the procedure.  You should plan to take it easy, moving slowly for the rest of the day.  You can resume normal activity the day after the procedure however you should NOT DRIVE or use heavy machinery for 24 hours (because of the sedation medicines used during the test).    SYMPTOMS TO REPORT IMMEDIATELY: A gastroenterologist can be reached at any hour.  During normal business hours, 8:30 AM  to 5:00 PM Monday through Friday, call [phone].  After hours and on weekends, please call the GI answering service at [phone] who will take a message and have the physician on call contact you.   Following lower endoscopy (colonoscopy or flexible sigmoidoscopy):  Excessive amounts of blood in the stool  Significant tenderness or worsening of abdominal pains  Swelling of the abdomen that is new, acute  Fever of 100F or higher  Following upper endoscopy (EGD)  Vomiting of blood or coffee ground material  New chest pain or pain under the shoulder blades  Painful or persistently difficult swallowing  New shortness of breath  Fever of 100F or higher  Black, tarry-looking stools  FOLLOW UP: If any biopsies were taken you will be contacted by phone or by letter within the next 1-3 weeks.  Call your gastroenterologist if you have not heard about the biopsies in 3 weeks.  Our staff will call the home number listed on your records the next business day following your procedure to check on you and address any questions or concerns that you may have at that time regarding the information given to you following your procedure. This is a courtesy call and so if there is no answer at the home number and we have not heard from you through the emergency physician on call, we will assume that you have returned to your regular daily activities without incident.  SIGNATURES/CONFIDENTIALITY: You and/or your care partner have signed  paperwork which will be entered into your electronic medical record.  These signatures attest to the fact that that the information above on your After Visit Summary has been reviewed and is understood.  Full responsibility of the confidentiality of this discharge information lies with you and/or your care-partner.

## 2012-05-18 NOTE — Op Note (Signed)
[facility] [address] [place], [address]   COLONOSCOPY PROCEDURE REPORT  PATIENT: Alexandra Walker, Alexandra Walker  MR#: [id number] BIRTHDATE: 04/11/1956 , 56  yrs. old GENDER: Female ENDOSCOPIST: Iva Boop, MD, [facility] REFERRED [id number], Nicholos Johns PROCEDURE DATE:  05/18/2012 PROCEDURE:   Colonoscopy with biopsy and Colonoscopy with snare polypectomy ASA CLASS:   Class II INDICATIONS:unexplained diarrhea. MEDICATIONS: There was residual sedation effect present from prior procedure, propofol (Diprivan) 300mg  IV, MAC sedation, administered by CRNA, and These medications were titrated to patient response per physician's verbal order  DESCRIPTION OF PROCEDURE:   After the risks benefits and alternatives of the procedure were thoroughly explained, informed consent was obtained.  A digital rectal exam revealed no abnormalities of the rectum.   The Fuse-Demo-Scope  endoscope was introduced through the anus and advanced to the terminal ileum which was intubated for a short distance. No adverse events experienced.   The quality of the prep was Prepopik excellent  The instrument was then slowly withdrawn as the colon was fully examined. Photographs are printed and will be uploaded to the EMR/database.      COLON FINDINGS: A polypoid shaped pedunculated polyp measuring 5 mm in size was found at the cecum.  A polypectomy was performed with a cold snare.  The resection was complete and the polyp tissue was completely retrieved.   Mild diverticulosis was noted in the sigmoid colon.   The mucosa appeared normal in the terminal ileum. The colon mucosa was otherwise normal, random cold biopsies taken. Retroflexed views revealed no abnormalities. The time to cecum=3 minutes 15 seconds.  Withdrawal time=14 minutes 54 seconds.  The scope was withdrawn and the procedure completed. COMPLICATIONS: There were no complications.  ENDOSCOPIC IMPRESSION: 1.   Pedunculated polyp measuring 5  mm in size was found at the cecum; polypectomy was performed with a cold snare 2.   Mild diverticulosis was noted in the sigmoid colon 3.   Normal mucosa in the terminal ileum 4.   The colon mucosa was otherwise normal - random biopsies taken  RECOMMENDATIONS: 1.  Timing of repeat colonoscopy will be determined by pathology findings. 2.  Office will call with the results. May need celiac serology if not done.   eSigned:  Iva Boop, MD, [facility] 05/18/2012 9:08 AM   cc: Montey Hora, MD and The Patient

## 2012-05-18 NOTE — Progress Notes (Signed)
Patient did not experience any of the following events: a burn prior to discharge; a fall within the facility; wrong site/side/patient/procedure/implant event; or a hospital transfer or hospital admission upon discharge from the facility. (G8907) Patient did not have preoperative order for IV antibiotic SSI prophylaxis. (G8918)  

## 2012-05-19 ENCOUNTER — Encounter: Payer: Self-pay | Admitting: Internal Medicine

## 2012-05-19 ENCOUNTER — Telehealth: Payer: Self-pay | Admitting: *Deleted

## 2012-05-19 NOTE — Telephone Encounter (Signed)
  Follow up Call-  Call back number 05/18/2012  Post procedure Call Back phone  # [phone]  Permission to leave phone message Yes     Patient questions:  Do you have a fever, pain , or abdominal swelling? no Pain Score  0 *  Have you tolerated food without any problems? no  Have you been able to return to your normal activities? yes  Do you have any questions about your discharge instructions: Diet   no Medications  no Follow up visit  no  Do you have questions or concerns about your Care? no  Actions: * If pain score is 4 or above: No action needed, pain <4.

## 2012-05-20 DIAGNOSIS — N200 Calculus of kidney: Secondary | ICD-10-CM

## 2012-05-20 DIAGNOSIS — M5137 Other intervertebral disc degeneration, lumbosacral region: Secondary | ICD-10-CM

## 2012-05-20 DIAGNOSIS — M51379 Other intervertebral disc degeneration, lumbosacral region without mention of lumbar back pain or lower extremity pain: Secondary | ICD-10-CM

## 2012-05-20 HISTORY — DX: Other intervertebral disc degeneration, lumbosacral region without mention of lumbar back pain or lower extremity pain: M51.379

## 2012-05-20 HISTORY — DX: Calculus of kidney: N20.0

## 2012-05-20 HISTORY — DX: Other intervertebral disc degeneration, lumbosacral region: M51.37

## 2012-05-27 NOTE — Progress Notes (Signed)
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

## 2012-05-28 ENCOUNTER — Emergency Department (HOSPITAL_BASED_OUTPATIENT_CLINIC_OR_DEPARTMENT_OTHER): Payer: No Typology Code available for payment source

## 2012-05-28 ENCOUNTER — Emergency Department (HOSPITAL_BASED_OUTPATIENT_CLINIC_OR_DEPARTMENT_OTHER)
Admission: EM | Admit: 2012-05-28 | Discharge: 2012-05-28 | Disposition: A | Discharge status: Home / Self Care | Payer: No Typology Code available for payment source | Attending: Emergency Medicine | Admitting: Emergency Medicine

## 2012-05-28 ENCOUNTER — Encounter (HOSPITAL_BASED_OUTPATIENT_CLINIC_OR_DEPARTMENT_OTHER): Payer: Self-pay | Admitting: *Deleted

## 2012-05-28 DIAGNOSIS — Z8719 Personal history of other diseases of the digestive system: Secondary | ICD-10-CM | POA: Insufficient documentation

## 2012-05-28 DIAGNOSIS — Z8709 Personal history of other diseases of the respiratory system: Secondary | ICD-10-CM | POA: Insufficient documentation

## 2012-05-28 DIAGNOSIS — S161XXA Strain of muscle, fascia and tendon at neck level, initial encounter: Secondary | ICD-10-CM

## 2012-05-28 DIAGNOSIS — S139XXA Sprain of joints and ligaments of unspecified parts of neck, initial encounter: Secondary | ICD-10-CM | POA: Insufficient documentation

## 2012-05-28 DIAGNOSIS — Z8739 Personal history of other diseases of the musculoskeletal system and connective tissue: Secondary | ICD-10-CM | POA: Insufficient documentation

## 2012-05-28 DIAGNOSIS — Y92410 Unspecified street and highway as the place of occurrence of the external cause: Secondary | ICD-10-CM | POA: Insufficient documentation

## 2012-05-28 DIAGNOSIS — I10 Essential (primary) hypertension: Secondary | ICD-10-CM | POA: Insufficient documentation

## 2012-05-28 DIAGNOSIS — Y9389 Activity, other specified: Secondary | ICD-10-CM | POA: Insufficient documentation

## 2012-05-28 DIAGNOSIS — S39012A Strain of muscle, fascia and tendon of lower back, initial encounter: Secondary | ICD-10-CM

## 2012-05-28 DIAGNOSIS — E785 Hyperlipidemia, unspecified: Secondary | ICD-10-CM | POA: Insufficient documentation

## 2012-05-28 DIAGNOSIS — S335XXA Sprain of ligaments of lumbar spine, initial encounter: Secondary | ICD-10-CM | POA: Insufficient documentation

## 2012-05-28 DIAGNOSIS — Z79899 Other long term (current) drug therapy: Secondary | ICD-10-CM | POA: Insufficient documentation

## 2012-05-28 MED ORDER — CYCLOBENZAPRINE HCL 5 MG PO TABS: 5.0000 mg | ORAL_TABLET | Freq: Two times a day (BID) | ORAL | Status: DC | PRN
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
## 2012-05-28 NOTE — ED Provider Notes (Signed)
Medical screening examination/treatment/procedure(s) were performed by non-physician practitioner and as supervising physician I was immediately available for consultation/collaboration.   Celene Kras, MD 05/28/12 [phone]
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
## 2012-06-04 ENCOUNTER — Ambulatory Visit (INDEPENDENT_AMBULATORY_CARE_PROVIDER_SITE_OTHER): Payer: Self-pay | Admitting: Sports Medicine

## 2012-06-04 VITALS — BP 140/100 | Ht 66.0 in | Wt 250.0 lb

## 2012-06-04 DIAGNOSIS — S134XXA Sprain of ligaments of cervical spine, initial encounter: Secondary | ICD-10-CM | POA: Insufficient documentation

## 2012-06-04 DIAGNOSIS — S139XXA Sprain of joints and ligaments of unspecified parts of neck, initial encounter: Secondary | ICD-10-CM

## 2012-06-04 NOTE — Patient Instructions (Addendum)
Thank you for coming in today. The good news is I do not think you have a long term injury.  The normal expected time to recover is a few weeks.  You should continue to get better.  You may benefit from a soft collar at night as needed at night.  Exercises: Isometrics -- Position your hand against you head. Try to move the head against your hand, but do not move your head.  Rotation left and right, flexion and extension forward and back, and side flexion right and left.  Come back in 4 -6 weeks.   Come back or go to the emergency room if you notice new weakness new numbness problems walking or bowel or bladder problems.
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
## 2012-07-09 ENCOUNTER — Ambulatory Visit (INDEPENDENT_AMBULATORY_CARE_PROVIDER_SITE_OTHER): Payer: Self-pay | Admitting: Sports Medicine

## 2012-07-09 VITALS — BP 136/82 | Ht 67.0 in | Wt 250.0 lb

## 2012-07-09 DIAGNOSIS — S134XXA Sprain of ligaments of cervical spine, initial encounter: Secondary | ICD-10-CM

## 2012-07-09 DIAGNOSIS — S139XXA Sprain of joints and ligaments of unspecified parts of neck, initial encounter: Secondary | ICD-10-CM

## 2012-07-09 MED ORDER — TRAMADOL HCL 50 MG PO TABS: 50.0000 mg | ORAL_TABLET | Freq: Four times a day (QID) | ORAL | Status: DC | PRN

## 2012-07-09 MED ORDER — CYCLOBENZAPRINE HCL 5 MG PO TABS: 5.0000 mg | ORAL_TABLET | Freq: Every evening | ORAL | Status: DC | PRN
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
## 2012-07-09 NOTE — Patient Instructions (Addendum)
Thank you for coming in today. I think the tramadol is reasonable twice a day as needed.  Try sleeping with the cervical collar.  I think PT is reasonable. We will use some Estim. If this is very helpful we will try a TENS unit.  Come back in 6 weeks.  Come back or go to the emergency room if you notice new weakness new numbness problems walking or bowel or bladder problems.

## 2012-08-19 ENCOUNTER — Ambulatory Visit: Payer: 59 | Admitting: Family Medicine

## 2012-10-25 ENCOUNTER — Other Ambulatory Visit: Payer: Self-pay | Admitting: Orthopedic Surgery

## 2012-12-18 ENCOUNTER — Encounter (HOSPITAL_BASED_OUTPATIENT_CLINIC_OR_DEPARTMENT_OTHER): Payer: Self-pay | Admitting: *Deleted

## 2012-12-18 NOTE — Progress Notes (Signed)
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

## 2012-12-23 ENCOUNTER — Encounter (HOSPITAL_BASED_OUTPATIENT_CLINIC_OR_DEPARTMENT_OTHER)
Admission: RE | Admit: 2012-12-23 | Discharge: 2012-12-23 | Disposition: A | Discharge status: Home / Self Care | Payer: 59 | Source: Ambulatory Visit | Attending: Orthopedic Surgery | Admitting: Orthopedic Surgery

## 2012-12-23 LAB — BASIC METABOLIC PANEL
CO2: 26 mEq/L (ref 19–32)
Chloride: 100 mEq/L (ref 96–112)
Creatinine, Ser: 0.81 mg/dL (ref 0.50–1.10)
Potassium: 3.9 mEq/L (ref 3.5–5.1)

## 2012-12-24 ENCOUNTER — Encounter (HOSPITAL_BASED_OUTPATIENT_CLINIC_OR_DEPARTMENT_OTHER): Payer: Self-pay | Admitting: *Deleted

## 2012-12-24 ENCOUNTER — Encounter (HOSPITAL_BASED_OUTPATIENT_CLINIC_OR_DEPARTMENT_OTHER): Payer: Self-pay | Admitting: Certified Registered Nurse Anesthetist

## 2012-12-24 ENCOUNTER — Ambulatory Visit (HOSPITAL_BASED_OUTPATIENT_CLINIC_OR_DEPARTMENT_OTHER): Payer: 59 | Admitting: Certified Registered Nurse Anesthetist

## 2012-12-24 ENCOUNTER — Ambulatory Visit (HOSPITAL_BASED_OUTPATIENT_CLINIC_OR_DEPARTMENT_OTHER)
Admission: RE | Admit: 2012-12-24 | Discharge: 2012-12-24 | Disposition: A | Discharge status: Home / Self Care | Payer: 59 | Source: Ambulatory Visit | Attending: Orthopedic Surgery | Admitting: Orthopedic Surgery

## 2012-12-24 ENCOUNTER — Encounter (HOSPITAL_BASED_OUTPATIENT_CLINIC_OR_DEPARTMENT_OTHER)
Admission: RE | Disposition: A | Discharge status: Home / Self Care | Payer: Self-pay | Source: Ambulatory Visit | Attending: Orthopedic Surgery

## 2012-12-24 DIAGNOSIS — Z885 Allergy status to narcotic agent status: Secondary | ICD-10-CM | POA: Insufficient documentation

## 2012-12-24 DIAGNOSIS — M129 Arthropathy, unspecified: Secondary | ICD-10-CM | POA: Insufficient documentation

## 2012-12-24 DIAGNOSIS — M461 Sacroiliitis, not elsewhere classified: Secondary | ICD-10-CM | POA: Insufficient documentation

## 2012-12-24 DIAGNOSIS — I491 Atrial premature depolarization: Secondary | ICD-10-CM | POA: Insufficient documentation

## 2012-12-24 DIAGNOSIS — R7309 Other abnormal glucose: Secondary | ICD-10-CM | POA: Insufficient documentation

## 2012-12-24 DIAGNOSIS — E785 Hyperlipidemia, unspecified: Secondary | ICD-10-CM | POA: Insufficient documentation

## 2012-12-24 DIAGNOSIS — I10 Essential (primary) hypertension: Secondary | ICD-10-CM | POA: Insufficient documentation

## 2012-12-24 DIAGNOSIS — J309 Allergic rhinitis, unspecified: Secondary | ICD-10-CM | POA: Insufficient documentation

## 2012-12-24 DIAGNOSIS — K589 Irritable bowel syndrome without diarrhea: Secondary | ICD-10-CM | POA: Insufficient documentation

## 2012-12-24 DIAGNOSIS — M720 Palmar fascial fibromatosis [Dupuytren]: Secondary | ICD-10-CM | POA: Insufficient documentation

## 2012-12-24 DIAGNOSIS — Z79899 Other long term (current) drug therapy: Secondary | ICD-10-CM | POA: Insufficient documentation

## 2012-12-24 HISTORY — DX: Other specified postprocedural states: Z98.890

## 2012-12-24 HISTORY — DX: Nausea with vomiting, unspecified: R11.2

## 2012-12-24 HISTORY — PX: MASS EXCISION: SHX2000

## 2012-12-24 LAB — GLUCOSE, CAPILLARY: Glucose-Capillary: 113 mg/dL — ABNORMAL HIGH (ref 70–99)

## 2012-12-24 LAB — POCT HEMOGLOBIN-HEMACUE: Hemoglobin: 13 g/dL (ref 12.0–15.0)

## 2012-12-24 SURGERY — EXCISION MASS
Anesthesia: Monitor Anesthesia Care | Site: Hand | Laterality: Left | Wound class: Clean

## 2012-12-24 MED ORDER — CHLORHEXIDINE GLUCONATE 4 % EX LIQD: 60.0000 mL | Freq: Once | CUTANEOUS | Status: DC

## 2012-12-24 MED ORDER — CEPHALEXIN 500 MG PO CAPS: 500.0000 mg | ORAL_CAPSULE | Freq: Four times a day (QID) | ORAL | Status: DC

## 2012-12-24 MED ORDER — LACTATED RINGERS IV SOLN: INTRAVENOUS | Status: DC

## 2012-12-24 MED ORDER — HYDROMORPHONE HCL PF 1 MG/ML IJ SOLN: 0.2500 mg | INTRAMUSCULAR | Status: DC | PRN

## 2012-12-24 MED ORDER — ONDANSETRON HCL 4 MG/2ML IJ SOLN: 4.0000 mg | Freq: Once | INTRAMUSCULAR | Status: DC | PRN

## 2012-12-24 MED ORDER — DEXAMETHASONE SODIUM PHOSPHATE 10 MG/ML IJ SOLN
INTRAMUSCULAR | Status: DC | PRN
  Administered 2012-12-24: 10 mg via INTRAVENOUS

## 2012-12-24 MED ORDER — LACTATED RINGERS IV SOLN
INTRAVENOUS | Status: DC
  Administered 2012-12-24 (×2): via INTRAVENOUS

## 2012-12-24 MED ORDER — OXYCODONE HCL 5 MG PO TABS: 5.0000 mg | ORAL_TABLET | Freq: Once | ORAL | Status: DC | PRN

## 2012-12-24 MED ORDER — FENTANYL CITRATE 0.05 MG/ML IJ SOLN: 50.0000 ug | INTRAMUSCULAR | Status: DC | PRN

## 2012-12-24 MED ORDER — CEFAZOLIN SODIUM-DEXTROSE 2-3 GM-% IV SOLR
2.0000 g | INTRAVENOUS | Status: AC
  Administered 2012-12-24: 2 g via INTRAVENOUS

## 2012-12-24 MED ORDER — ONDANSETRON HCL 4 MG/2ML IJ SOLN
INTRAMUSCULAR | Status: DC | PRN
  Administered 2012-12-24: 4 mg via INTRAVENOUS

## 2012-12-24 MED ORDER — BUPIVACAINE HCL (PF) 0.5 % IJ SOLN
INTRAMUSCULAR | Status: DC | PRN
  Administered 2012-12-24: 17 mL

## 2012-12-24 MED ORDER — 0.9 % SODIUM CHLORIDE (POUR BTL) OPTIME
TOPICAL | Status: DC | PRN
  Administered 2012-12-24: 250 mL

## 2012-12-24 MED ORDER — PROPOFOL 10 MG/ML IV BOLUS
INTRAVENOUS | Status: DC | PRN
  Administered 2012-12-24: 200 mg via INTRAVENOUS

## 2012-12-24 MED ORDER — MIDAZOLAM HCL 5 MG/5ML IJ SOLN
INTRAMUSCULAR | Status: DC | PRN
  Administered 2012-12-24: 2 mg via INTRAVENOUS

## 2012-12-24 MED ORDER — MIDAZOLAM HCL 2 MG/2ML IJ SOLN: 1.0000 mg | INTRAMUSCULAR | Status: DC | PRN

## 2012-12-24 MED ORDER — OXYCODONE HCL 5 MG/5ML PO SOLN: 5.0000 mg | Freq: Once | ORAL | Status: DC | PRN

## 2012-12-24 MED ORDER — LIDOCAINE HCL (CARDIAC) 20 MG/ML IV SOLN
INTRAVENOUS | Status: DC | PRN
  Administered 2012-12-24: 100 mg via INTRAVENOUS

## 2012-12-24 MED ORDER — PROMETHAZINE HCL 25 MG/ML IJ SOLN: 12.5000 mg | Freq: Once | INTRAMUSCULAR | Status: DC | PRN

## 2012-12-24 MED ORDER — TAPENTADOL HCL 50 MG PO TABS: 100.0000 mg | ORAL_TABLET | ORAL | Status: DC | PRN

## 2012-12-24 MED ORDER — SCOPOLAMINE 1 MG/3DAYS TD PT72
1.0000 | MEDICATED_PATCH | TRANSDERMAL | Status: DC
  Administered 2012-12-24: 1.5 mg via TRANSDERMAL

## 2012-12-24 MED ORDER — FENTANYL CITRATE 0.05 MG/ML IJ SOLN
INTRAMUSCULAR | Status: DC | PRN
  Administered 2012-12-24: 100 ug via INTRAVENOUS

## 2012-12-24 SURGICAL SUPPLY — 59 items
BANDAGE CONFORM 3  STR LF (GAUZE/BANDAGES/DRESSINGS) ×2 IMPLANT
BANDAGE ELASTIC 3 VELCRO ST LF (GAUZE/BANDAGES/DRESSINGS) ×2 IMPLANT
BANDAGE GAUZE ELAST BULKY 4 IN (GAUZE/BANDAGES/DRESSINGS) IMPLANT
BLADE SURG 15 STRL LF DISP TIS (BLADE) ×2 IMPLANT
BLADE SURG 15 STRL SS (BLADE) ×4
BNDG COHESIVE 3X5 TAN STRL LF (GAUZE/BANDAGES/DRESSINGS) IMPLANT
BRUSH SCRUB EZ PLAIN DRY (MISCELLANEOUS) ×2 IMPLANT
CLOTH BEACON ORANGE TIMEOUT ST (SAFETY) ×2 IMPLANT
CORDS BIPOLAR (ELECTRODE) ×2 IMPLANT
COVER MAYO STAND STRL (DRAPES) ×2 IMPLANT
COVER TABLE BACK 60X90 (DRAPES) ×2 IMPLANT
CUFF TOURNIQUET SINGLE 18IN (TOURNIQUET CUFF) IMPLANT
CUFF TOURNIQUET SINGLE 24IN (TOURNIQUET CUFF) ×1 IMPLANT
DECANTER SPIKE VIAL GLASS SM (MISCELLANEOUS) IMPLANT
DRAPE EXTREMITY T 121X128X90 (DRAPE) ×2 IMPLANT
DRAPE SURG 17X23 STRL (DRAPES) ×2 IMPLANT
DRSG EMULSION OIL 3X3 NADH (GAUZE/BANDAGES/DRESSINGS) ×2 IMPLANT
GAUZE SPONGE 4X4 16PLY XRAY LF (GAUZE/BANDAGES/DRESSINGS) IMPLANT
GLOVE BIO SURGEON STRL SZ8 (GLOVE) ×2 IMPLANT
GLOVE BIOGEL M STRL SZ7.5 (GLOVE) ×1 IMPLANT
GLOVE BIOGEL PI IND STRL 7.0 (GLOVE) IMPLANT
GLOVE BIOGEL PI IND STRL 7.5 (GLOVE) IMPLANT
GLOVE BIOGEL PI INDICATOR 7.0 (GLOVE) ×1
GLOVE BIOGEL PI INDICATOR 7.5 (GLOVE) ×1
GLOVE ECLIPSE 6.5 STRL STRAW (GLOVE) ×1 IMPLANT
GLOVE SS BIOGEL STRL SZ 8 (GLOVE) ×1 IMPLANT
GLOVE SUPERSENSE BIOGEL SZ 8 (GLOVE) ×1
GOWN PREVENTION PLUS XLARGE (GOWN DISPOSABLE) ×3 IMPLANT
GOWN PREVENTION PLUS XXLARGE (GOWN DISPOSABLE) ×2 IMPLANT
LOOP VESSEL MAXI BLUE (MISCELLANEOUS) IMPLANT
NDL HYPO 25X1 1.5 SAFETY (NEEDLE) ×1 IMPLANT
NEEDLE HYPO 22GX1.5 SAFETY (NEEDLE) ×1 IMPLANT
NEEDLE HYPO 25X1 1.5 SAFETY (NEEDLE) ×2 IMPLANT
NS IRRIG 1000ML POUR BTL (IV SOLUTION) ×2 IMPLANT
PACK BASIN DAY SURGERY FS (CUSTOM PROCEDURE TRAY) ×2 IMPLANT
PAD ALCOHOL SWAB (MISCELLANEOUS) ×8 IMPLANT
PAD CAST 3X4 CTTN HI CHSV (CAST SUPPLIES) ×2 IMPLANT
PADDING CAST ABS 3INX4YD NS (CAST SUPPLIES) ×1
PADDING CAST ABS 4INX4YD NS (CAST SUPPLIES) ×1
PADDING CAST ABS COTTON 3X4 (CAST SUPPLIES) ×1 IMPLANT
PADDING CAST ABS COTTON 4X4 ST (CAST SUPPLIES) ×1 IMPLANT
PADDING CAST COTTON 3X4 STRL (CAST SUPPLIES) ×4
SPLINT PLASTER CAST XFAST 3X15 (CAST SUPPLIES) IMPLANT
SPLINT PLASTER CAST XFAST 4X15 (CAST SUPPLIES) IMPLANT
SPLINT PLASTER XTRA FAST SET 4 (CAST SUPPLIES)
SPLINT PLASTER XTRA FASTSET 3X (CAST SUPPLIES)
SPONGE GAUZE 4X4 12PLY (GAUZE/BANDAGES/DRESSINGS) ×1 IMPLANT
STOCKINETTE 4X48 STRL (DRAPES) ×2 IMPLANT
STOCKINETTE SYNTHETIC 3 UNSTER (CAST SUPPLIES) IMPLANT
STOCKINETTE SYNTHETIC 4 NONSTR (MISCELLANEOUS) IMPLANT
STRIP CLOSURE SKIN 1/2X4 (GAUZE/BANDAGES/DRESSINGS) ×1 IMPLANT
SUT PROLENE 4 0 PS 2 18 (SUTURE) ×2 IMPLANT
SUT PROLENE 5 0 P 3 (SUTURE) ×2 IMPLANT
SYR BULB 3OZ (MISCELLANEOUS) ×2 IMPLANT
SYR CONTROL 10ML LL (SYRINGE) ×2 IMPLANT
TOWEL OR 17X24 6PK STRL BLUE (TOWEL DISPOSABLE) ×2 IMPLANT
TOWEL OR NON WOVEN STRL DISP B (DISPOSABLE) ×2 IMPLANT
TRAY DSU PREP LF (CUSTOM PROCEDURE TRAY) ×1 IMPLANT
UNDERPAD 30X30 INCONTINENT (UNDERPADS AND DIAPERS) ×2 IMPLANT

## 2012-12-24 NOTE — Anesthesia Preprocedure Evaluation (Signed)
Anesthesia Evaluation  Patient identified by MRN, date of birth, ID band Patient awake    Reviewed: Allergy & Precautions, H&P , NPO status , Patient's Chart, lab work & pertinent test results, reviewed documented beta blocker date and time   History of Anesthesia Complications (+) PONV  Airway Mallampati: I TM Distance: >3 FB Neck ROM: Full    Dental  (+) Teeth Intact and Dental Advisory Given   Pulmonary  breath sounds clear to auscultation        Cardiovascular hypertension, Pt. on medications and Pt. on home beta blockers Rhythm:Regular Rate:Normal     Neuro/Psych    GI/Hepatic   Endo/Other  Morbid obesity  Renal/GU      Musculoskeletal   Abdominal   Peds  Hematology   Anesthesia Other Findings   Reproductive/Obstetrics                           Anesthesia Physical Anesthesia Plan  ASA: III  Anesthesia Plan: General and MAC   Post-op Pain Management:    Induction: Intravenous  Airway Management Planned: Simple Face Mask and LMA  Additional Equipment:   Intra-op Plan:   Post-operative Plan: Extubation in OR  Informed Consent: I have reviewed the patients History and Physical, chart, labs and discussed the procedure including the risks, benefits and alternatives for the proposed anesthesia with the patient or authorized representative who has indicated his/her understanding and acceptance.   Dental advisory given  Plan Discussed with: CRNA, Anesthesiologist and Surgeon  Anesthesia Plan Comments:         Anesthesia Quick Evaluation

## 2012-12-24 NOTE — H&P (Signed)
Alexandra Walker is an 57 y.o. female.   Chief Complaint:left hand mass HPI: Marland KitchenMarland KitchenPatient presents for evaluation and treatment of the of their upper extremity predicament. The patient denies neck back chest or of abdominal pain. The patient notes that they have no lower extremity problems. The patient from primarily complains of the upper extremity pain noted. Past Medical History  Diagnosis Date  . Hypertension   . Hyperlipidemia   . IBS (irritable bowel syndrome)   . Glucose intolerance (impaired glucose tolerance)   . PAC (premature atrial contraction)   . Arthritis   . Seasonal allergies   . Sacroiliitis   . HLA B27 (HLA B27 positive)   . PONV (postoperative nausea and vomiting)     Past Surgical History  Procedure Laterality Date  . Abdominal laparascopic  92  . Left finger surgery    . Colonoscopy w/ biopsies  2005    [facility]  . Esophagogastroduodenoscopy  2005    [facility]   . Knee arthroscopy  07,10    left  . Dilation and curettage of uterus  07,11  . Breast surgery  2003    bx lt-neg  . Cervical cone biopsy      Family History  Problem Relation Age of Onset  . Heart attack Father   . Heart disease Father   . Coronary artery disease Mother   . Hypertension Mother   . Heart disease Mother   . Hypertension Sister   . Hypertension Brother   . Colon cancer Neg Hx    Social History:  reports that she has never smoked. She has never used smokeless tobacco. She reports that she does not drink alcohol or use illicit drugs.  Allergies:  Allergies  Allergen Reactions  . Percocet (Oxycodone-Acetaminophen) Nausea And Vomiting  . Vicodin (Hydrocodone-Acetaminophen) Nausea And Vomiting    Medications Prior to Admission  Medication Sig Dispense Refill  . cholecalciferol (VITAMIN D) 1000 UNITS tablet Take 1,000 Units by mouth daily.      . cyclobenzaprine (FLEXERIL) 5 MG tablet Take 1 tablet (5 mg total) by mouth at bedtime as needed for muscle spasms.  30 tablet   2  . GREEN COFFEE BEAN PO Take 1 tablet by mouth daily.      . hydrochlorothiazide 25 MG tablet Once daily       . ibuprofen (MOTRIN IB) 200 MG tablet as needed.        . metFORMIN (GLUCOPHAGE) 500 MG tablet Take 1,000 mg by mouth 2 (two) times daily with a meal.      . metoprolol succinate (TOPROL XL) 25 MG 24 hr tablet 50 mg. Once daily      . Multiple Vitamin (MULTIVITAMIN) tablet Take 1 tablet by mouth daily.      . Omega-3 Fatty Acids (FISH OIL PO) Take 1 tablet by mouth daily.      Marland Kitchen omeprazole (PRILOSEC) 20 MG capsule Take 1 capsule (20 mg total) by mouth daily.  30 capsule  11  . simvastatin (ZOCOR) 40 MG tablet Take 40 mg by mouth every evening.      . traMADol (ULTRAM) 50 MG tablet Take 1 tablet (50 mg total) by mouth every 6 (six) hours as needed for pain.  60 tablet  2    Results for orders placed during the hospital encounter of 12/24/12 (from the past 48 hour(s))  BASIC METABOLIC PANEL     Status: Abnormal   Collection Time    12/23/12  2:30 PM  Result Value Range   Sodium 138  135 - 145 mEq/L   Potassium 3.9  3.5 - 5.1 mEq/L   Chloride 100  96 - 112 mEq/L   CO2 26  19 - 32 mEq/L   Glucose, Bld 101 (*) 70 - 99 mg/dL   BUN 11  6 - 23 mg/dL   Creatinine, Ser 4.09  0.50 - 1.10 mg/dL   Calcium 81.1  8.4 - 91.4 mg/dL   GFR calc non Af Amer 81 (*) >90 mL/min   GFR calc Af Amer >90  >90 mL/min   Comment:            The eGFR has been calculated     using the CKD EPI equation.     This calculation has not been     validated in all clinical     situations.     eGFR's persistently     <90 mL/min signify     possible Chronic Kidney Disease.  GLUCOSE, CAPILLARY     Status: Abnormal   Collection Time    12/24/12  1:13 PM      Result Value Range   Glucose-Capillary 113 (*) 70 - 99 mg/dL  POCT HEMOGLOBIN-HEMACUE     Status: None   Collection Time    12/24/12  1:15 PM      Result Value Range   Hemoglobin 13.0  12.0 - 15.0 g/dL   No results found.  Review of Systems   Eyes: Negative.   Respiratory: Negative.   Cardiovascular: Negative.   Gastrointestinal: Negative.   Genitourinary: Negative.   Neurological: Negative.   Endo/Heme/Allergies: Negative.   Psychiatric/Behavioral: Negative.     Blood pressure 132/83, pulse 66, temperature 98.1 F (36.7 C), temperature source Oral, resp. rate 16, height 5\' 7"  (1.702 m), weight 114.873 kg (253 lb 4 oz), SpO2 99.00%. Physical Exam left hand palmar mass with pain and prominence .Marland KitchenThe patient is alert and oriented in no acute distress the patient complains of pain in the affected upper extremity.  The patient is noted to have a normal HEENT exam.  Lung fields show equal chest expansion and no shortness of breath  abdomen exam is nontender without distention.  Lower extremity examination does not show any fracture dislocation or blood clot symptoms.  Pelvis is stable neck and back are stable and nontender  Assessment/Plan .Marland KitchenWe are planning surgery for your upper extremity. The risk and benefits of surgery include risk of bleeding infection anesthesia damage to normal structures and failure of the surgery to accomplish its intended goals of relieving symptoms and restoring function with this in mind we'll going to proceed. I have specifically discussed with the patient the pre-and postoperative regime and the does and don'ts and risk and benefits in great detail. Risk and benefits of surgery also include risk of dystrophy chronic nerve pain failure of the healing process to go onto completion and other inherent risks of surgery The relavent the pathophysiology of the disease/injury process, as well as the alternatives for treatment and postoperative course of action has been discussed in great detail with the patient who desires to proceed.  We will do everything in our power to help you (the patient) restore function to the upper extremity. Is a pleasure to see this patient today.   Karen Chafe 12/24/2012, 1:37 PM

## 2012-12-24 NOTE — Anesthesia Postprocedure Evaluation (Signed)
  Anesthesia Post-op Note  Patient: Alexandra Walker  Procedure(s) Performed: Procedure(s): LEFT EXCISION OF PALMAR MASS X TWO (Left)  Patient Location: PACU  Anesthesia Type:General  Level of Consciousness: awake and alert   Airway and Oxygen Therapy: Patient Spontanous Breathing  Post-op Pain: mild  Post-op Assessment: Post-op Vital signs reviewed, Patient's Cardiovascular Status Stable, Respiratory Function Stable, Patent Airway, No signs of Nausea or vomiting and Pain level controlled  Post-op Vital Signs: stable  Complications: No apparent anesthesia complications

## 2012-12-24 NOTE — Transfer of Care (Signed)
Immediate Anesthesia Transfer of Care Note  Patient: Alexandra Walker  Procedure(s) Performed: Procedure(s): LEFT EXCISION OF PALMAR MASS X TWO (Left)  Patient Location: PACU  Anesthesia Type:General  Level of Consciousness: awake and alert   Airway & Oxygen Therapy: Patient Spontanous Breathing and Patient connected to face mask oxygen  Post-op Assessment: Report given to PACU RN and Post -op Vital signs reviewed and stable  Post vital signs: Reviewed and stable  Complications: No apparent anesthesia complications

## 2012-12-24 NOTE — Op Note (Signed)
Dict #[id number] Alexandra Cervi MD

## 2012-12-25 ENCOUNTER — Encounter (HOSPITAL_BASED_OUTPATIENT_CLINIC_OR_DEPARTMENT_OTHER): Payer: Self-pay | Admitting: Orthopedic Surgery

## 2012-12-25 NOTE — Op Note (Signed)
NAMEMarland Kitchen  Alexandra, Walker              ACCOUNT NO.:  [id number]  MEDICAL RECORD NO.:  [id number]  LOCATION:                               FACILITY:  [facility]  PHYSICIAN:  Alexandra Ano. Rekha Hobbins, M.D.DATE OF BIRTH:  09-27-1955  DATE OF PROCEDURE:  12/24/2012 DATE OF DISCHARGE:  12/24/2012                              OPERATIVE REPORT   PREOPERATIVE DIAGNOSIS:  Palmar fascia disease consistent with Dupuytren's contracture, left small and middle finger.  POSTOPERATIVE DIAGNOSIS:  Palmar fascia disease consistent with Dupuytren's contracture, left small and middle finger.  PROCEDURE: 1. Subtotal palmar fasciectomy, palm and middle finger, left hand. 2. Subtotal palmar fasciectomy, palm and small finger, left hand. 3. Common digital nerve neurolysis to the 4th web space, including the     radial digital nerve to the small finger, extensive in nature.  SURGEON:  Alexandra Walker, M.D.  ASSISTANT:  Karie Chimera, P.A.-C.  COMPLICATIONS:  None.  ANESTHESIA:  General.  TOURNIQUET TIME:  Less than a hour.  INDICATIONS:  The patient is a pleasant female who presents with the above-mentioned diagnosis.  I have counseled her in regard to risks and benefits of surgery including risk of infection, bleeding, anesthesia, damage to neurovascular structures, and failure of surgery to accomplish its intended goals of relieving symptoms and restoring function.  With these in mind, the patient desires to proceed.  All questions have been encouraged and answered preoperatively.  OPERATIVE PROCEDURE:  The patient was seen by myself and anesthesia. She was counseled in preop holding area.  She was prepped and draped in the usual sterile fashion with Betadine scrub and paint.  Following this, underwent final time-out.  Arm was elevated.  Tourniquet was insufflated.  Modified Brunner incision was made about the middle finger.  Dissection was carried down.  Skin flaps elevated and subtotal palmar  fasciectomy was accomplished.  This was done from proximal and distal outlining the neurovascular bundles carefully protecting them and not encroaching on to the flexor sheath.  I removed a large portion of disease about the middle finger and palm which was primarily a pretendinous cord.  Following this, wet lap was placed and the area was irrigated.  Once this was done, modified Brunner incisions slightly longer approximately 2 inches was made overlying the small finger and palmar region.  Dissection was carried down.  Skin flaps were elevated the common digital nerve.  The 4th web space and radial digital nerve of the small finger underwent a very careful and cautious evaluation and neurolysis.  Following this, the entire diseased portion of the palmar fascia was removed.  This was done without difficulty.  I then identified the ulnar digital nerve to the small finger and did not encroach upon this or the flexor sheath.  Once the disease was removed, we deflated the tourniquet, secured hemostasis by electrocautery, closed wound with Prolene and placed 15 mL Sensorcaine without epinephrine 0.5% nature in the wound for postop analgesia.  She tolerated this well. Dressing was placed in the form of Adaptic and a volar plaster splint. She will see [name] back in the office in 10-14 days.  I asked her to elevate movement and size of fingers.  I have a therapy appointment immediately following our visit at the 12-day mark.  These notes have been discussed.  All questions had been encouraged and answered.  DISCHARGE MEDICINES:  Nucynta and Keflex.     Alexandra Walker, M.D.     [facility]  D:  12/24/2012  T:  12/25/2012  Job:  [id number]

## 2013-05-20 LAB — HM PAP SMEAR

## 2013-10-04 ENCOUNTER — Other Ambulatory Visit: Payer: Self-pay

## 2013-10-04 DIAGNOSIS — Z1231 Encounter for screening mammogram for malignant neoplasm of breast: Secondary | ICD-10-CM

## 2013-10-05 ENCOUNTER — Ambulatory Visit: Payer: 59

## 2013-11-16 ENCOUNTER — Ambulatory Visit (INDEPENDENT_AMBULATORY_CARE_PROVIDER_SITE_OTHER): Payer: 59 | Admitting: Cardiovascular Disease

## 2013-11-16 ENCOUNTER — Encounter (INDEPENDENT_AMBULATORY_CARE_PROVIDER_SITE_OTHER): Payer: Self-pay

## 2013-11-16 ENCOUNTER — Encounter: Payer: Self-pay | Admitting: Cardiovascular Disease

## 2013-11-16 VITALS — BP 110/70 | HR 72 | Ht 66.0 in | Wt 251.0 lb

## 2013-11-16 DIAGNOSIS — R0789 Other chest pain: Secondary | ICD-10-CM

## 2013-11-16 DIAGNOSIS — I491 Atrial premature depolarization: Secondary | ICD-10-CM

## 2013-11-16 NOTE — Patient Instructions (Signed)
Your physician wants you to follow-up in:  12 months.  You will receive a reminder letter in the mail two months in advance. If you don't receive a letter, please call our office to schedule the follow-up appointment.   

## 2013-11-16 NOTE — Progress Notes (Signed)
History of Present Illness: 58 yo WF with history of HTN, HLD, PACs, irritable bowel syndrome who is here today for cardiac follow up. I saw her as a new patient May 2013 to establish cardiology care. She has been followed in [facility] with Dr. Linus Salmons. She has had several stress tests that were normal with negative stress echo in 2003, 2005 and a normal stress myoview in 2009. She describes chronic chest pain. She has history of PACs that are controlled with Toprol. 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 She has glucose intolerance. Carotid artery dopplers were normal 12/07. She has chronic right chest wall pain that she believes is due to her back issues. She also has esophagitis and had an esophageal dilatation 2014.   She tells me that she has been doing well. No changes in her breathing. No change in occasional chest pains. She has been pushing a mower without issues.   Primary Care Physician: Nena Polio [facility])   Past Medical History  Diagnosis Date  . Hypertension   . Hyperlipidemia   . IBS (irritable bowel syndrome)   . Glucose intolerance (impaired glucose tolerance)   . PAC (premature atrial contraction)   . Arthritis   . Seasonal allergies   . Sacroiliitis   . HLA B27 (HLA B27 positive)   . PONV (postoperative nausea and vomiting)     Past Surgical History  Procedure Laterality Date  . Abdominal laparascopic  92  . Left finger surgery    . Colonoscopy w/ biopsies  2005    [facility]  . Esophagogastroduodenoscopy  2005    [facility]   . Knee arthroscopy  07,10    left  . Dilation and curettage of uterus  07,11  . Breast surgery  2003    bx lt-neg  . Cervical cone biopsy    . Mass excision Left 12/24/2012    Procedure: LEFT EXCISION OF PALMAR MASS X TWO;  Surgeon: Roseanne Kaufman, MD;  Location: [facility];  Service: Orthopedics;  Laterality: Left;    Current Outpatient Prescriptions    Medication Sig Dispense Refill  . cholecalciferol (VITAMIN D) 1000 UNITS tablet Take 1,000 Units by mouth daily.      . cyclobenzaprine (FLEXERIL) 5 MG tablet Take 1 tablet (5 mg total) by mouth at bedtime as needed for muscle spasms.  30 tablet  2  . hydrochlorothiazide 25 MG tablet Once daily       . ibuprofen (MOTRIN IB) 200 MG tablet as needed.        . Liraglutide (VICTOZA [place]) Inject 1.2 mg into the skin.      Marland Kitchen losartan (COZAAR) 50 MG tablet Take 50 mg by mouth daily.      . metFORMIN (GLUCOPHAGE) 500 MG tablet Take 1,000 mg by mouth 2 (two) times daily with a meal.      . metoprolol succinate (TOPROL-XL) 50 MG 24 hr tablet Take 50 mg by mouth daily. Take with or immediately following a meal.      . Multiple Vitamin (MULTIVITAMIN) tablet Take 1 tablet by mouth daily.      . Omega-3 Fatty Acids (FISH OIL PO) Take 1 tablet by mouth daily.      Marland Kitchen omeprazole (PRILOSEC) 20 MG capsule Take 1 capsule (20 mg total) by mouth daily.  30 capsule  11  . simvastatin (ZOCOR) 40 MG tablet Take 40 mg by mouth every evening.  No current facility-administered medications for this visit.    Allergies  Allergen Reactions  . Percocet [Oxycodone-Acetaminophen] Nausea And Vomiting  . Vicodin [Hydrocodone-Acetaminophen] Nausea And Vomiting    History   Social History  . Marital Status: Divorced    Spouse Name: N/A    Number of Children: 1  . Years of Education: N/A   Occupational History  . [employer]   Social History Main Topics  . Smoking status: Never Smoker   . Smokeless tobacco: Never Used  . Alcohol Use: No  . Drug Use: No  . Sexual Activity: Not on file   Other Topics Concern  . 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 Heart attack Father   . Heart disease Father   . Coronary artery disease Mother   . Hypertension Mother   . Heart disease  Mother   . Hypertension Sister   . Hypertension Brother   . Colon cancer Neg Hx     Review of Systems:  As stated in the HPI and otherwise negative.   BP 110/70  Pulse 72  Ht '5\' 6"'  (1.676 m)  Wt 251 lb (113.853 kg)  BMI 40.53 kg/m2  Physical Examination: General: Well developed, well nourished, NAD HEENT: OP clear, mucus membranes moist SKIN: warm, dry. No rashes. Neuro: No focal deficits Musculoskeletal: Muscle strength 5/5 all ext Psychiatric: Mood and affect normal Neck: No JVD, no carotid bruits, no thyromegaly, no lymphadenopathy. Lungs:Clear bilaterally, no wheezes, rhonci, crackles Cardiovascular: Regular rate and rhythm. No murmurs, gallops or rubs. Abdomen:Soft. Bowel sounds present. Non-tender.  Extremities: No lower extremity edema. Pulses are 2 + in the bilateral DP/PT.  EKG: Sinus, 1st degree AV block. LAD. IVCD. Poor R wave progression precordial leads.   Assessment and Plan:   1. Chest pain: She has chronic, atypical chest pains with negative stress tests in 2003, 2005 and 2009. Her risk factors for CAD include HTN, HLD, borderline DM and a strong family history of CAD. For now, will continue risk factor reduction. Her BP is at goal. Lipids are at goal and managed by primary care.   2. PACs: Well controlled with Toprol. No changes.

## 2013-12-30 ENCOUNTER — Ambulatory Visit
Admission: RE | Admit: 2013-12-30 | Discharge: 2013-12-30 | Disposition: A | Discharge status: Home / Self Care | Payer: 59 | Source: Ambulatory Visit

## 2013-12-30 DIAGNOSIS — Z1231 Encounter for screening mammogram for malignant neoplasm of breast: Secondary | ICD-10-CM

## 2014-03-08 ENCOUNTER — Encounter: Payer: Self-pay | Admitting: Emergency Medicine

## 2014-03-08 ENCOUNTER — Emergency Department (INDEPENDENT_AMBULATORY_CARE_PROVIDER_SITE_OTHER): Payer: 59

## 2014-03-08 ENCOUNTER — Emergency Department
Admission: EM | Admit: 2014-03-08 | Discharge: 2014-03-08 | Disposition: A | Discharge status: Home / Self Care | Payer: 59 | Source: Home / Self Care

## 2014-03-08 DIAGNOSIS — H6123 Impacted cerumen, bilateral: Secondary | ICD-10-CM

## 2014-03-08 DIAGNOSIS — I10 Essential (primary) hypertension: Secondary | ICD-10-CM

## 2014-03-08 DIAGNOSIS — R0789 Other chest pain: Secondary | ICD-10-CM

## 2014-03-08 DIAGNOSIS — S20219A Contusion of unspecified front wall of thorax, initial encounter: Secondary | ICD-10-CM

## 2014-03-08 DIAGNOSIS — S2020XA Contusion of thorax, unspecified, initial encounter: Secondary | ICD-10-CM
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
## 2014-05-20 DIAGNOSIS — D259 Leiomyoma of uterus, unspecified: Secondary | ICD-10-CM | POA: Insufficient documentation

## 2014-07-21 ENCOUNTER — Encounter: Payer: Self-pay | Admitting: Cardiovascular Disease

## 2014-07-25 ENCOUNTER — Encounter: Payer: Self-pay | Admitting: Family Medicine

## 2014-07-25 ENCOUNTER — Ambulatory Visit (INDEPENDENT_AMBULATORY_CARE_PROVIDER_SITE_OTHER): Payer: 59 | Admitting: Family Medicine

## 2014-07-25 VITALS — BP 131/84 | HR 76 | Ht 67.0 in | Wt 250.0 lb

## 2014-07-25 DIAGNOSIS — M545 Low back pain, unspecified: Secondary | ICD-10-CM

## 2014-07-25 MED ORDER — CYCLOBENZAPRINE HCL 5 MG PO TABS: 5.0000 mg | ORAL_TABLET | Freq: Every evening | ORAL | Status: DC | PRN

## 2014-07-25 MED ORDER — PREDNISONE (PAK) 10 MG PO TABS: ORAL_TABLET | ORAL | Status: DC

## 2014-07-25 NOTE — Patient Instructions (Addendum)
You have sacroiliitis. Take prednisone as directed for 6 days. Flexeril as needed for spasms. Do home exercises/stretches as directed - pick 2-3, hold for 20-30 seconds and repeat 3 times. Call [facility] for evaluation and treatment - [phone] Alexandra Walker and Alexandra Walker are the ones we work with commonly). Follow up with me in 1 month to 6 weeks. Consider SI joint injections, imaging of lumbar spine if not improving as expected.

## 2014-07-27 DIAGNOSIS — M545 Low back pain, unspecified: Secondary | ICD-10-CM | POA: Insufficient documentation

## 2014-07-27 NOTE — Assessment & Plan Note (Signed)
location of pain consistent with SI joint dysfunction, sacroiliitis.  No red flags. No history of osteoporosis or bony tenderness to suggest compression fracture.  No radiation into legs as with disc herniation/radiculopathy.  Start with prednisone.  Flexeril for secondary spasms.  Shown home exercises and stretches to do daily.  She's done PT before - advised this is a consideration.  Also given the number for [facility] for evaluation.  F/u in 1 month to 6 weeks.  Consider SI injection, lumbar imaging if not improving.

## 2014-07-27 NOTE — Progress Notes (Signed)
PCP: Garlan Fillers, MD  Subjective:   HPI: Patient is a 59 y.o. female here for low back pain.  Patient reports for the past 2 weeks she's had worsening low back pain. Recalls being under crawlspace when she felt a crack in her lower back. Feels pain primarily in SI joint areas. Pain worse with rolling over and when sitting to get up. No history of osteoporosis. Is HLA B27 positive. Difficulty squatting. Tried flexeril, icing, ibuprofen. No numbness or tingling.  Past Medical History  Diagnosis Date  . Hypertension   . Hyperlipidemia   . IBS (irritable bowel syndrome)   . Glucose intolerance (impaired glucose tolerance)   . PAC (premature atrial contraction)   . Arthritis   . Seasonal allergies   . Sacroiliitis   . HLA B27 (HLA B27 positive)   . PONV (postoperative nausea and vomiting)     Current Outpatient Prescriptions on File Prior to Visit  Medication Sig Dispense Refill  . cholecalciferol (VITAMIN D) 1000 UNITS tablet Take 1,000 Units by mouth daily.    . hydrochlorothiazide 25 MG tablet Once daily     . ibuprofen (MOTRIN IB) 200 MG tablet as needed.      . Liraglutide (VICTOZA [place]) Inject 1.2 mg into the skin.    Marland Kitchen losartan (COZAAR) 50 MG tablet Take 50 mg by mouth daily.    . metFORMIN (GLUCOPHAGE) 500 MG tablet Take 1,000 mg by mouth 2 (two) times daily with a meal.    . metoprolol succinate (TOPROL-XL) 50 MG 24 hr tablet Take 50 mg by mouth daily. Take with or immediately following a meal.    . Multiple Vitamin (MULTIVITAMIN) tablet Take 1 tablet by mouth daily.    . Omega-3 Fatty Acids (FISH OIL PO) Take 1 tablet by mouth daily.    Marland Kitchen omeprazole (PRILOSEC) 20 MG capsule Take 1 capsule (20 mg total) by mouth daily. 30 capsule 11  . simvastatin (ZOCOR) 40 MG tablet Take 40 mg by mouth every evening.     No current facility-administered medications on file prior to visit.    Past Surgical History  Procedure Laterality Date  . Abdominal laparascopic  92  .  Left finger surgery    . Colonoscopy w/ biopsies  2005    [facility]  . Esophagogastroduodenoscopy  2005    [facility]   . Knee arthroscopy  07,10    left  . Dilation and curettage of uterus  07,11  . Breast surgery  2003    bx lt-neg  . Cervical cone biopsy    . Mass excision Left 12/24/2012    Procedure: LEFT EXCISION OF PALMAR MASS X TWO;  Surgeon: Roseanne Kaufman, MD;  Location: [place];  Service: Orthopedics;  Laterality: Left;    Allergies  Allergen Reactions  . Percocet [Oxycodone-Acetaminophen] Nausea And Vomiting  . Vicodin [Hydrocodone-Acetaminophen] Nausea And Vomiting    History   Social History  . Marital Status: Divorced    Spouse Name: N/A  . Number of Children: 1  . Years of Education: N/A   Occupational History  . [employer]   Social History Main Topics  . Smoking status: Never Smoker   . Smokeless tobacco: Never Used  . Alcohol Use: No  . Drug Use: No  . Sexual Activity: Not on file   Other Topics Concern  . 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 Heart attack Father   . Heart disease Father   . Coronary artery disease Mother   . Hypertension Mother   . Heart disease Mother   . Hypertension Sister   . Hypertension Brother   . Colon cancer Neg Hx     BP 131/84 mmHg  Pulse 76  Ht '5\' 7"'  (1.702 m)  Wt 250 lb (113.399 kg)  BMI 39.15 kg/m2  Review of Systems: See HPI above.    Objective:  Physical Exam:  Gen: NAD  Back: No gross deformity, scoliosis. TTP bilateral SI joints.  No midline or bony TTP over spinous processes. FROM with mild pain on flexion. Strength LEs 5/5 all muscle groups.   2+ MSRs in patellar and achilles tendons, equal bilaterally. Negative SLRs. Sensation intact to light touch bilaterally. Negative logroll bilateral hips Negative fabers and piriformis  stretches.    Assessment & Plan:  1. Low back pain - location of pain consistent with SI joint dysfunction, sacroiliitis.  No red flags. No history of osteoporosis or bony tenderness to suggest compression fracture.  No radiation into legs as with disc herniation/radiculopathy.  Start with prednisone.  Flexeril for secondary spasms.  Shown home exercises and stretches to do daily.  She's done PT before - advised this is a consideration.  Also given the number for [facility] for evaluation.  F/u in 1 month to 6 weeks.  Consider SI injection, lumbar imaging if not improving.

## 2014-08-22 ENCOUNTER — Telehealth: Payer: Self-pay | Admitting: Internal Medicine

## 2014-08-22 NOTE — Telephone Encounter (Signed)
Left message for patient to call back  

## 2014-08-22 NOTE — Telephone Encounter (Deleted)
Patient reports that after taking nystatin (prescribed by her primary care)  and fluconazole she still has a white coating on her tongue.  She wants to be tested for thrush.  I advised her that she should return to her primary care for this.  She is advised that if did not clear up after 10 days of fluconazole unlikely to be thrush.  She is advised that she may also need to see her dentist about possible causes of coating on her tongue.

## 2014-08-22 NOTE — Telephone Encounter (Signed)
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  She wants to have a follow up with Dr. Carlean Purl she is scheduled for 09/01/14.  Patient will have her primary fax a copy of the notes and lab work

## 2014-09-01 ENCOUNTER — Other Ambulatory Visit (INDEPENDENT_AMBULATORY_CARE_PROVIDER_SITE_OTHER): Payer: 59

## 2014-09-01 ENCOUNTER — Ambulatory Visit (INDEPENDENT_AMBULATORY_CARE_PROVIDER_SITE_OTHER): Payer: 59 | Admitting: Internal Medicine

## 2014-09-01 ENCOUNTER — Encounter: Payer: Self-pay | Admitting: Internal Medicine

## 2014-09-01 VITALS — BP 112/72 | HR 68 | Ht 65.75 in | Wt 250.0 lb

## 2014-09-01 DIAGNOSIS — K589 Irritable bowel syndrome without diarrhea: Secondary | ICD-10-CM

## 2014-09-01 DIAGNOSIS — K853 Drug induced acute pancreatitis without necrosis or infection: Secondary | ICD-10-CM

## 2014-09-01 DIAGNOSIS — R1013 Epigastric pain: Secondary | ICD-10-CM

## 2014-09-01 LAB — AMYLASE: AMYLASE: 19 U/L — AB (ref 27–131)

## 2014-09-01 LAB — LIPASE: LIPASE: 49 U/L (ref 11.0–59.0)

## 2014-09-01 MED ORDER — PANTOPRAZOLE SODIUM 40 MG PO TBEC: 40.0000 mg | DELAYED_RELEASE_TABLET | Freq: Every day | ORAL | Status: DC

## 2014-09-01 MED ORDER — RIFAXIMIN 550 MG PO TABS: 550.0000 mg | ORAL_TABLET | Freq: Three times a day (TID) | ORAL | Status: DC

## 2014-09-01 NOTE — Patient Instructions (Addendum)
Your physician has requested that you go to the basement for the following lab work before leaving today:Amylase and Lipase.  We have sent the following medications to your pharmacy for you to pick up at your convenience:Protonix and Xifaxan. Take the Protonix daily up to 2 months.   If your are not completely better after 2 months, then please call our office.    Thank you for choosing me and [facility].  Silvano Rusk, MD., Marval Regal

## 2014-09-01 NOTE — Progress Notes (Signed)
   Subjective:    Patient ID: Alexandra Walker, female    DOB: 09-17-1955, 59 y.o.   MRN: [id number] Cc: abdominal pain, ? 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 Lipsae was mildly elevated and amylase NL. Victoza stopped by Dr. Benjamine Mola - and she is better. Still has 1-2 episodes of sharp stabbing RUQ and LUQ pains that last a few mins only. Seem to occur when she is hungry. ? If ulcer she says. She had omeprazole on her list but is not taking due to health concerns - recent dementia reports.  Dysphagia to fish oil capsules has occurred - change size of pills and better. Prior ring dilation at EGD 2013 probably did not make much difference she says. No heartburn.  Episodic diarrhea better than when I saw her in 2013. Still problematic and has fecal incontinence 1x/week. Did not want to take Lotronex due to possible side effects. Asking about Xifaxan Tx.  Medications, allergies, past medical history, past surgical history, family history and social history are reviewed and updated in the EMR.   Review of Systems As above    Objective:   Physical Exam BP 112/72 mmHg  Pulse 68  Ht 5' 5.75" (1.67 m)  Wt 250 lb (113.399 kg)  BMI 40.66 kg/m2 WDWN [name] NAD, obese Eyes anicteric abd soft and NT Appropriate mood and affect A and o x 3  I have reviewed PCP note and labs Amylase 23 (low) lipase 67 - sl high 08/10/2014    Assessment & Plan:   Dyspepsia - Plan: pantoprazole (PROTONIX) 40 MG tablet daily x 1-2 months for this LUQ and RUQ pain - call back if persists  IBS (irritable bowel syndrome) - Plan: rifaximin (XIFAXAN) 550 MG TABS tablet to see if episodic diarrhea stops - if not consider Viberzi (or perhaps Lotronex)  Possible Drug-induced acute pancreatitis - lipase elevation is minimal and NL amylase - so could have been gastric or intestinal in origin but do  think she has had side effects from Victoza.  I appreciate the opportunity to care for this patient. BW:LSLH,TDSKAJGO M, MD

## 2014-09-01 NOTE — Progress Notes (Signed)
Quick Note:  Ok - notified with My Chart ______

## 2014-09-02 ENCOUNTER — Telehealth: Payer: Self-pay | Admitting: Internal Medicine

## 2014-09-02 DIAGNOSIS — R1010 Upper abdominal pain, unspecified: Secondary | ICD-10-CM

## 2014-09-02 NOTE — Telephone Encounter (Signed)
I would like her to schedule an abdominal ultrasound to evaluate the upper abdominal pain please Tell her evaluating for gallstones mainly

## 2014-09-02 NOTE — Telephone Encounter (Signed)
Patient reports that she had an episode of epigastric pain that lasted for about 30 minutes.  She wants to know if this is dangerous.  She wants to know when to call us.  She is advised that if she has persistent pain that is not resolving, new symptoms accompanied with nausea, vomiting, fever, black stools she should call or seek treatment in the ER if we are closed.  She is advised that she is welcome to call if she is not sure about her symptoms and we will review it with Dr. Carlean Purl or one of his partners.  She states that right now the pain is all gone.  She will call back for any additional questions or concerns.

## 2014-09-05 NOTE — Telephone Encounter (Signed)
Patient notified of recommendations and [name] scheduled at [facility] for 09/13/14 8:30.  She is asked to arrive at 8:15 and by NPO after midnight

## 2014-09-05 NOTE — Telephone Encounter (Signed)
Left message for patient to call back  

## 2014-09-07 ENCOUNTER — Telehealth: Payer: Self-pay | Admitting: Family Medicine

## 2014-09-08 NOTE — Telephone Encounter (Signed)
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

## 2014-09-13 ENCOUNTER — Ambulatory Visit (HOSPITAL_COMMUNITY)
Admission: RE | Admit: 2014-09-13 | Discharge: 2014-09-13 | Disposition: A | Discharge status: Home / Self Care | Payer: 59 | Source: Ambulatory Visit | Attending: Internal Medicine | Admitting: Internal Medicine

## 2014-09-13 DIAGNOSIS — N200 Calculus of kidney: Secondary | ICD-10-CM | POA: Insufficient documentation

## 2014-09-13 DIAGNOSIS — R1010 Upper abdominal pain, unspecified: Secondary | ICD-10-CM

## 2014-09-13 DIAGNOSIS — K760 Fatty (change of) liver, not elsewhere classified: Secondary | ICD-10-CM | POA: Diagnosis not present

## 2014-09-14 NOTE — Progress Notes (Signed)
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 Also has fatty liver - can cause pain but probably not what she has. Kidney stone (in kidney not causing pain) Given her sxs and the polyps many experts recommend remove th gallbladder so Suggest she see [facility]. 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

## 2014-09-30 ENCOUNTER — Other Ambulatory Visit: Payer: Self-pay | Admitting: Surgery

## 2014-10-24 ENCOUNTER — Encounter (HOSPITAL_BASED_OUTPATIENT_CLINIC_OR_DEPARTMENT_OTHER): Payer: Self-pay | Admitting: *Deleted

## 2014-10-24 ENCOUNTER — Encounter (HOSPITAL_BASED_OUTPATIENT_CLINIC_OR_DEPARTMENT_OTHER)
Admission: RE | Admit: 2014-10-24 | Discharge: 2014-10-24 | Disposition: A | Discharge status: Home / Self Care | Payer: 59 | Source: Ambulatory Visit | Attending: Surgery | Admitting: Surgery

## 2014-10-24 DIAGNOSIS — K219 Gastro-esophageal reflux disease without esophagitis: Secondary | ICD-10-CM | POA: Diagnosis not present

## 2014-10-24 DIAGNOSIS — K828 Other specified diseases of gallbladder: Secondary | ICD-10-CM | POA: Diagnosis present

## 2014-10-24 DIAGNOSIS — K811 Chronic cholecystitis: Secondary | ICD-10-CM | POA: Diagnosis not present

## 2014-10-24 DIAGNOSIS — E119 Type 2 diabetes mellitus without complications: Secondary | ICD-10-CM | POA: Diagnosis not present

## 2014-10-24 DIAGNOSIS — I10 Essential (primary) hypertension: Secondary | ICD-10-CM | POA: Diagnosis not present

## 2014-10-24 DIAGNOSIS — E780 Pure hypercholesterolemia: Secondary | ICD-10-CM | POA: Diagnosis not present

## 2014-10-24 DIAGNOSIS — Z6841 Body Mass Index (BMI) 40.0 and over, adult: Secondary | ICD-10-CM | POA: Diagnosis not present

## 2014-10-24 DIAGNOSIS — Z79899 Other long term (current) drug therapy: Secondary | ICD-10-CM | POA: Diagnosis not present

## 2014-10-24 LAB — BASIC METABOLIC PANEL
Anion gap: 8 (ref 5–15)
BUN: 9 mg/dL (ref 6–20)
CALCIUM: 9 mg/dL (ref 8.9–10.3)
CO2: 28 mmol/L (ref 22–32)
Chloride: 102 mmol/L (ref 101–111)
Creatinine, Ser: 0.66 mg/dL (ref 0.44–1.00)
Glucose, Bld: 106 mg/dL — ABNORMAL HIGH (ref 65–99)
POTASSIUM: 4.6 mmol/L (ref 3.5–5.1)
Sodium: 138 mmol/L (ref 135–145)

## 2014-10-25 NOTE — H&P (Signed)
Alexandra Walker 09/30/2014 11:20 AM Location: [facility] Patient #: [id number] DOB: [birth year] Divorced / Language: Alexandra Walker / Race: White Female  History of Present Illness (Shenita Trego A. Ninfa Linden MD; 09/30/2014 11:37 AM) Patient words: Gallbladder polyps.  The patient is a 59 year old female who presents with abdominal pain. This is a pleasant female referred by Dr. 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 She has a long history of irritable bowel syndrome. She has had intermittent right upper quadrant abdominal pain with nausea and vomiting. She recently had pancreatitis secondary to a new medication which has since resolved. Today, she feels well. She has intermittent loose stools. The pain is moderate and sharp in intensity. She recently had a ultrasound which showed multiple polyps in the gallbladder wall.   Other Problems Ivor Costa, CMA; 09/30/2014 11:21 AM) Arthritis Back Pain Bladder Problems Diabetes Mellitus Gastroesophageal Reflux Disease General anesthesia - complications Hemorrhoids High blood pressure Hypercholesterolemia Kidney Stone Other disease, cancer, significant illness Pancreatitis  Past Surgical History Ivor Costa, [place]; 09/30/2014 11:21 AM) Breast Biopsy Bilateral. Breast Mass; Local Excision Left. Knee Surgery Left.  Diagnostic Studies History Ivor Costa, [place]; 09/30/2014 11:21 AM) Colonoscopy within last year Mammogram within last year Pap Smear 1-5 years ago  Allergies Ivor Costa, CMA; 09/30/2014 11:23 AM) Vicodin *ANALGESICS - OPIOID* Vomiting. Victoza *ANTIDIABETICS* Patient reports Victoza gave her Pancreatitis Percocet *ANALGESICS - OPIOID* Vomiting.  Medication History Ivor Costa, CMA; 09/30/2014 11:30 AM) Vitamin D (1000UNIT Capsule, Oral) Active. Flexeril (5MG  Tablet, Oral) Active. Losartan Potassium (50MG  Tablet, Oral) Active. Hydrochlorothiazide (25MG  Tablet, Oral)  Active. MetFORMIN HCl (500MG  Tablet, Oral) Active. Metoprolol Tartrate (50MG  Tablet, Oral) Active. Protonix (40MG  Packet, Oral) Active. Xifaxan (550MG  Tablet, Oral) Active. Zocor (40MG  Tablet, Oral) Active. Multi Vitamin Daily (Oral) Active. Omega 3 (550MG  Capsule, Oral) Active.  Social History Ivor Costa, [place]; 09/30/2014 11:21 AM) Alcohol use Occasional alcohol use. Caffeine use Carbonated beverages, Coffee, Tea. No drug use Tobacco use Never smoker.  Family History Ivor Costa, [place]; 09/30/2014 11:21 AM) Arthritis Mother. Depression Mother. Diabetes Mellitus Mother. Heart Disease Father, Mother. Heart disease in female family member before age 64 Hypertension Brother, Mother, Sister. Kidney Disease Mother.  Pregnancy / Birth History Ivor Costa, [place]; 09/30/2014 11:21 AM) Age at menarche 55 years. Age of menopause 16-50 Gravida 1 Maternal age 80-40 Para 0  Review of Systems Ivor Costa CMA; 09/30/2014 11:21 AM) General Not Present- Appetite Loss, Chills, Fatigue, Fever, Night Sweats, Weight Gain and Weight Loss. Skin Not Present- Change in Wart/Mole, Dryness, Hives, Jaundice, New Lesions, Non-Healing Wounds, Rash and Ulcer. HEENT Present- Seasonal Allergies. Not Present- Earache, Hearing Loss, Hoarseness, Nose Bleed, Oral Ulcers, Ringing in the Ears, Sinus Pain, Sore Throat, Visual Disturbances, Wears glasses/contact lenses and Yellow Eyes. Respiratory Not Present- Bloody sputum, Chronic Cough, Difficulty Breathing, Snoring and Wheezing. Breast Not Present- Breast Mass, Breast Pain, Nipple Discharge and Skin Changes. Cardiovascular Present- Palpitations and Swelling of Extremities. Not Present- Chest Pain, Difficulty Breathing Lying Down, Leg Cramps, Rapid Heart Rate and Shortness of Breath. Gastrointestinal Present- Abdominal Pain, Chronic diarrhea and Indigestion. Not Present- Bloating, Bloody Stool, Change in Bowel Habits, Constipation,  Difficulty Swallowing, Excessive gas, Gets full quickly at meals, Hemorrhoids, Nausea, Rectal Pain and Vomiting. Female Genitourinary Present- Pelvic Pain. Not Present- Frequency, Nocturia, Painful Urination and Urgency. Musculoskeletal Present- Back Pain. Not Present- Joint Pain, Joint Stiffness, Muscle Pain, Muscle Weakness and Swelling of Extremities. Neurological Not Present- Decreased Memory, Fainting, Headaches, Numbness, Seizures, Tingling, Tremor, Trouble walking and Weakness. Psychiatric Not  Present- Anxiety, Bipolar, Change in Sleep Pattern, Depression, Fearful and Frequent crying. Endocrine Present- Hot flashes. Not Present- Cold Intolerance, Excessive Hunger, Hair Changes, Heat Intolerance and New Diabetes. Hematology Not Present- Easy Bruising, Excessive bleeding, Gland problems, HIV and Persistent Infections.   Vitals Ivor Costa CMA; 09/30/2014 11:21 AM) 09/30/2014 11:21 AM Weight: 251.6 lb Temp.: 97.30F(Temporal)  Pulse: 72 (Regular)  Resp.: 16 (Unlabored)  BP: 126/84 (Sitting, Left Arm, Standard)    Physical Exam (Kaycee Haycraft A. Ninfa Linden MD; 09/30/2014 11:37 AM) General Mental Status-Alert. General Appearance-Consistent with stated age. Hydration-Well hydrated. Voice-Normal.  Head and Neck Head-normocephalic, atraumatic with no lesions or palpable masses.  Eye Eyeball - Bilateral-Extraocular movements intact. Sclera/Conjunctiva - Bilateral-No scleral icterus.  Chest and Lung Exam Chest and lung exam reveals -quiet, even and easy respiratory effort with no use of accessory muscles and on auscultation, normal breath sounds, no adventitious sounds and normal vocal resonance. Inspection Chest Wall - Normal. Back - normal.  Cardiovascular Cardiovascular examination reveals -on palpation PMI is normal in location and amplitude, no palpable S3 or S4. Normal cardiac borders., normal heart sounds, regular rate and rhythm with no murmurs, carotid  auscultation reveals no bruits and normal pedal pulses bilaterally.  Abdomen Inspection Inspection of the abdomen reveals - No Hernias. Skin - Scar - no surgical scars. Palpation/Percussion Palpation and Percussion of the abdomen reveal - Soft, Non Tender, No Rebound tenderness, No Rigidity (guarding) and No hepatosplenomegaly. Auscultation Auscultation of the abdomen reveals - Bowel sounds normal.  Neurologic - Did not examine.  Musculoskeletal - Did not examine.    Assessment & Plan (Johnpatrick Jenny A. Ninfa Linden MD; 09/30/2014 11:39 AM) GALLBLADDER POLYP (575.6  K82.4) Impression: Based on her symptoms, she certainly may also have some mild chronic cholecystitis or biliary dyskinesia. I discussed the ultrasound findings with her in detail. The chance of malignancy is low but not 0. Given all her abdominal complaints, I would recommend a laparoscopic cholecystectomy to see if this will help her symptoms as well as for histologic evaluation. I discussed the surgery with her in detail and gave her literature regarding surgery. I discussed the risks of surgery which includes but is not limited to bleeding, infection, bile duct injury, bile leak, injury to other structures, the need to convert to an open procedure, the chance this may not resolve all her symptoms, postoperative recovery, etc. She understands and wishes to proceed with surgery Current Plans  Pt Education - Pamphlet Given - Laparoscopic Gallbladder Surgery: discussed with patient and provided information.

## 2014-10-26 ENCOUNTER — Ambulatory Visit (HOSPITAL_BASED_OUTPATIENT_CLINIC_OR_DEPARTMENT_OTHER): Payer: 59 | Admitting: Anesthesiology

## 2014-10-26 ENCOUNTER — Ambulatory Visit (HOSPITAL_BASED_OUTPATIENT_CLINIC_OR_DEPARTMENT_OTHER)
Admission: RE | Admit: 2014-10-26 | Discharge: 2014-10-26 | Disposition: A | Discharge status: Home / Self Care | Payer: 59 | Source: Ambulatory Visit | Attending: Surgery | Admitting: Surgery

## 2014-10-26 ENCOUNTER — Encounter (HOSPITAL_BASED_OUTPATIENT_CLINIC_OR_DEPARTMENT_OTHER)
Admission: RE | Disposition: A | Discharge status: Home / Self Care | Payer: Self-pay | Source: Ambulatory Visit | Attending: Surgery

## 2014-10-26 ENCOUNTER — Encounter (HOSPITAL_BASED_OUTPATIENT_CLINIC_OR_DEPARTMENT_OTHER): Payer: Self-pay | Admitting: Anesthesiology

## 2014-10-26 DIAGNOSIS — E780 Pure hypercholesterolemia: Secondary | ICD-10-CM | POA: Insufficient documentation

## 2014-10-26 DIAGNOSIS — I10 Essential (primary) hypertension: Secondary | ICD-10-CM | POA: Insufficient documentation

## 2014-10-26 DIAGNOSIS — K828 Other specified diseases of gallbladder: Secondary | ICD-10-CM | POA: Insufficient documentation

## 2014-10-26 DIAGNOSIS — K811 Chronic cholecystitis: Secondary | ICD-10-CM | POA: Insufficient documentation

## 2014-10-26 DIAGNOSIS — E119 Type 2 diabetes mellitus without complications: Secondary | ICD-10-CM | POA: Insufficient documentation

## 2014-10-26 DIAGNOSIS — Z6841 Body Mass Index (BMI) 40.0 and over, adult: Secondary | ICD-10-CM | POA: Insufficient documentation

## 2014-10-26 DIAGNOSIS — Z79899 Other long term (current) drug therapy: Secondary | ICD-10-CM | POA: Insufficient documentation

## 2014-10-26 DIAGNOSIS — K219 Gastro-esophageal reflux disease without esophagitis: Secondary | ICD-10-CM | POA: Insufficient documentation

## 2014-10-26 HISTORY — PX: CHOLECYSTECTOMY: SHX55

## 2014-10-26 HISTORY — DX: Gastro-esophageal reflux disease without esophagitis: K21.9

## 2014-10-26 LAB — POCT HEMOGLOBIN-HEMACUE: Hemoglobin: 13.9 g/dL (ref 12.0–15.0)

## 2014-10-26 LAB — GLUCOSE, CAPILLARY: GLUCOSE-CAPILLARY: 124 mg/dL — AB (ref 65–99)

## 2014-10-26 SURGERY — LAPAROSCOPIC CHOLECYSTECTOMY
Anesthesia: General | Site: Abdomen

## 2014-10-26 MED ORDER — CEFAZOLIN SODIUM-DEXTROSE 2-3 GM-% IV SOLR: 2.0000 g | INTRAVENOUS | Status: DC

## 2014-10-26 MED ORDER — PROPOFOL INFUSION 10 MG/ML OPTIME
INTRAVENOUS | Status: DC | PRN
  Administered 2014-10-26: 160 ug/kg/min via INTRAVENOUS

## 2014-10-26 MED ORDER — LACTATED RINGERS IV SOLN
INTRAVENOUS | Status: DC
  Administered 2014-10-26 (×2): via INTRAVENOUS

## 2014-10-26 MED ORDER — LACTATED RINGERS IV SOLN
INTRAVENOUS | Status: DC | PRN
  Administered 2014-10-26 (×2): via INTRAVENOUS

## 2014-10-26 MED ORDER — SODIUM CHLORIDE 0.9 % IR SOLN
Status: DC | PRN
  Administered 2014-10-26: 1000 mL

## 2014-10-26 MED ORDER — FENTANYL CITRATE (PF) 100 MCG/2ML IJ SOLN: 50.0000 ug | INTRAMUSCULAR | Status: DC | PRN

## 2014-10-26 MED ORDER — BUPIVACAINE-EPINEPHRINE (PF) 0.5% -1:200000 IJ SOLN
INTRAMUSCULAR | Status: DC | PRN
  Administered 2014-10-26: 20 mL

## 2014-10-26 MED ORDER — FENTANYL CITRATE (PF) 100 MCG/2ML IJ SOLN
INTRAMUSCULAR | Status: AC
  Filled 2014-10-26: qty 2

## 2014-10-26 MED ORDER — SCOPOLAMINE 1 MG/3DAYS TD PT72
1.0000 | MEDICATED_PATCH | TRANSDERMAL | Status: DC
  Administered 2014-10-26: 1.5 mg via TRANSDERMAL

## 2014-10-26 MED ORDER — BUPIVACAINE-EPINEPHRINE (PF) 0.5% -1:200000 IJ SOLN
INTRAMUSCULAR | Status: AC
  Filled 2014-10-26: qty 30

## 2014-10-26 MED ORDER — SODIUM CHLORIDE 0.9 % IJ SOLN: 3.0000 mL | INTRAMUSCULAR | Status: DC | PRN

## 2014-10-26 MED ORDER — FENTANYL CITRATE (PF) 100 MCG/2ML IJ SOLN
INTRAMUSCULAR | Status: AC
  Filled 2014-10-26: qty 8

## 2014-10-26 MED ORDER — PROPOFOL 500 MG/50ML IV EMUL
INTRAVENOUS | Status: AC
  Filled 2014-10-26: qty 50

## 2014-10-26 MED ORDER — ACETAMINOPHEN 650 MG RE SUPP: 650.0000 mg | RECTAL | Status: DC | PRN

## 2014-10-26 MED ORDER — CEFAZOLIN SODIUM-DEXTROSE 2-3 GM-% IV SOLR
INTRAVENOUS | Status: AC
  Filled 2014-10-26: qty 50

## 2014-10-26 MED ORDER — ACETAMINOPHEN 325 MG PO TABS: 650.0000 mg | ORAL_TABLET | ORAL | Status: DC | PRN

## 2014-10-26 MED ORDER — TRAMADOL HCL 50 MG PO TABS
50.0000 mg | ORAL_TABLET | Freq: Four times a day (QID) | ORAL | Status: DC | PRN
  Administered 2014-10-26: 50 mg via ORAL

## 2014-10-26 MED ORDER — MIDAZOLAM HCL 2 MG/2ML IJ SOLN
INTRAMUSCULAR | Status: AC
  Filled 2014-10-26: qty 2

## 2014-10-26 MED ORDER — MEPERIDINE HCL 25 MG/ML IJ SOLN
6.2500 mg | INTRAMUSCULAR | Status: DC | PRN
Start: 2014-10-26 — End: 2014-10-26

## 2014-10-26 MED ORDER — MIDAZOLAM HCL 5 MG/5ML IJ SOLN
INTRAMUSCULAR | Status: DC | PRN
  Administered 2014-10-26: 2 mg via INTRAVENOUS

## 2014-10-26 MED ORDER — PROPOFOL 10 MG/ML IV BOLUS
INTRAVENOUS | Status: AC
  Filled 2014-10-26: qty 80

## 2014-10-26 MED ORDER — DEXAMETHASONE SODIUM PHOSPHATE 4 MG/ML IJ SOLN
INTRAMUSCULAR | Status: DC | PRN
  Administered 2014-10-26: 10 mg via INTRAVENOUS

## 2014-10-26 MED ORDER — GLYCOPYRROLATE 0.2 MG/ML IJ SOLN: 0.2000 mg | Freq: Once | INTRAMUSCULAR | Status: DC | PRN

## 2014-10-26 MED ORDER — SODIUM CHLORIDE 0.9 % IV SOLN: 250.0000 mL | INTRAVENOUS | Status: DC | PRN

## 2014-10-26 MED ORDER — TRAMADOL HCL 50 MG PO TABS
ORAL_TABLET | ORAL | Status: AC
  Filled 2014-10-26: qty 1

## 2014-10-26 MED ORDER — FENTANYL CITRATE (PF) 100 MCG/2ML IJ SOLN: 25.0000 ug | INTRAMUSCULAR | Status: DC | PRN

## 2014-10-26 MED ORDER — KETOROLAC TROMETHAMINE 30 MG/ML IJ SOLN
INTRAMUSCULAR | Status: DC | PRN
  Administered 2014-10-26: 30 mg via INTRAVENOUS

## 2014-10-26 MED ORDER — SODIUM CHLORIDE 0.9 % IJ SOLN: 3.0000 mL | Freq: Two times a day (BID) | INTRAMUSCULAR | Status: DC

## 2014-10-26 MED ORDER — GLYCOPYRROLATE 0.2 MG/ML IJ SOLN
INTRAMUSCULAR | Status: DC | PRN
  Administered 2014-10-26: 0.2 mg via INTRAVENOUS

## 2014-10-26 MED ORDER — ONDANSETRON HCL 4 MG/2ML IJ SOLN
INTRAMUSCULAR | Status: DC | PRN
  Administered 2014-10-26 (×2): 4 mg via INTRAVENOUS

## 2014-10-26 MED ORDER — LIDOCAINE HCL (CARDIAC) 20 MG/ML IV SOLN
INTRAVENOUS | Status: DC | PRN
  Administered 2014-10-26: 50 mg via INTRAVENOUS

## 2014-10-26 MED ORDER — TRAMADOL HCL 50 MG PO TABS: 50.0000 mg | ORAL_TABLET | Freq: Four times a day (QID) | ORAL | Status: DC | PRN

## 2014-10-26 MED ORDER — PROPOFOL 10 MG/ML IV BOLUS
INTRAVENOUS | Status: DC | PRN
  Administered 2014-10-26: 200 mg via INTRAVENOUS
  Administered 2014-10-26: 300 mg via INTRAVENOUS

## 2014-10-26 MED ORDER — MIDAZOLAM HCL 2 MG/2ML IJ SOLN: 1.0000 mg | INTRAMUSCULAR | Status: DC | PRN

## 2014-10-26 MED ORDER — FENTANYL CITRATE (PF) 100 MCG/2ML IJ SOLN
25.0000 ug | INTRAMUSCULAR | Status: DC | PRN
  Administered 2014-10-26: 25 ug via INTRAVENOUS
  Administered 2014-10-26: 50 ug via INTRAVENOUS

## 2014-10-26 MED ORDER — SUCCINYLCHOLINE CHLORIDE 20 MG/ML IJ SOLN
INTRAMUSCULAR | Status: DC | PRN
  Administered 2014-10-26: 120 mg via INTRAVENOUS
  Administered 2014-10-26: 100 mg via INTRAVENOUS

## 2014-10-26 MED ORDER — FENTANYL CITRATE (PF) 100 MCG/2ML IJ SOLN
INTRAMUSCULAR | Status: DC | PRN
  Administered 2014-10-26 (×2): 100 ug via INTRAVENOUS
  Administered 2014-10-26: 150 ug via INTRAVENOUS
  Administered 2014-10-26: 50 ug via INTRAVENOUS

## 2014-10-26 MED ORDER — CEFAZOLIN SODIUM-DEXTROSE 2-3 GM-% IV SOLR
INTRAVENOUS | Status: DC | PRN
  Administered 2014-10-26: 2 g via INTRAVENOUS

## 2014-10-26 SURGICAL SUPPLY — 37 items
APPLIER CLIP 5 13 M/L LIGAMAX5 (MISCELLANEOUS) ×2
APR CLP MED LRG 5 ANG JAW (MISCELLANEOUS) ×1
BAG SPEC RTRVL LRG 6X4 10 (ENDOMECHANICALS) ×1
BLADE CLIPPER SURG (BLADE) IMPLANT
CHLORAPREP W/TINT 26ML (MISCELLANEOUS) ×2 IMPLANT
CLIP APPLIE 5 13 M/L LIGAMAX5 (MISCELLANEOUS) ×1 IMPLANT
COVER MAYO STAND STRL (DRAPES) IMPLANT
DECANTER SPIKE VIAL GLASS SM (MISCELLANEOUS) ×1 IMPLANT
DRAPE C-ARM 42X72 X-RAY (DRAPES) IMPLANT
DRAPE LAPAROSCOPIC ABDOMINAL (DRAPES) ×2 IMPLANT
ELECT REM PT RETURN 9FT ADLT (ELECTROSURGICAL) ×2
ELECTRODE REM PT RTRN 9FT ADLT (ELECTROSURGICAL) ×1 IMPLANT
FILTER SMOKE EVAC LAPAROSHD (FILTER) ×1 IMPLANT
GLOVE BIO SURGEON STRL SZ7 (GLOVE) ×1 IMPLANT
GLOVE BIOGEL M 7.0 STRL (GLOVE) ×1 IMPLANT
GLOVE BIOGEL PI IND STRL 7.5 (GLOVE) IMPLANT
GLOVE BIOGEL PI INDICATOR 7.5 (GLOVE) ×2
GLOVE SURG SIGNA 7.5 PF LTX (GLOVE) ×2 IMPLANT
GOWN STRL REUS W/ TWL LRG LVL3 (GOWN DISPOSABLE) ×3 IMPLANT
GOWN STRL REUS W/TWL LRG LVL3 (GOWN DISPOSABLE) ×6
LIQUID BAND (GAUZE/BANDAGES/DRESSINGS) ×2 IMPLANT
NS IRRIG 1000ML POUR BTL (IV SOLUTION) ×1 IMPLANT
PACK BASIN DAY SURGERY FS (CUSTOM PROCEDURE TRAY) ×2 IMPLANT
POUCH SPECIMEN RETRIEVAL 10MM (ENDOMECHANICALS) ×1 IMPLANT
SCISSORS LAP 5X35 DISP (ENDOMECHANICALS) IMPLANT
SET CHOLANGIOGRAPH 5 50 .035 (SET/KITS/TRAYS/PACK) IMPLANT
SET IRRIG TUBING LAPAROSCOPIC (IRRIGATION / IRRIGATOR) ×2 IMPLANT
SLEEVE ENDOPATH XCEL 5M (ENDOMECHANICALS) ×4 IMPLANT
SLEEVE SCD COMPRESS KNEE MED (MISCELLANEOUS) ×2 IMPLANT
SPECIMEN JAR SMALL (MISCELLANEOUS) ×2 IMPLANT
SUT MON AB 4-0 PC3 18 (SUTURE) ×2 IMPLANT
TOWEL OR 17X24 6PK STRL BLUE (TOWEL DISPOSABLE) ×2 IMPLANT
TRAY LAPAROSCOPIC (CUSTOM PROCEDURE TRAY) ×2 IMPLANT
TROCAR XCEL BLUNT TIP 100MML (ENDOMECHANICALS) ×2 IMPLANT
TROCAR XCEL NON-BLD 5MMX100MML (ENDOMECHANICALS) ×2 IMPLANT
TUBE CONNECTING 20X1/4 (TUBING) ×2 IMPLANT
TUBING INSUFFLATION (TUBING) ×2 IMPLANT

## 2014-10-26 NOTE — Anesthesia Postprocedure Evaluation (Signed)
  Anesthesia Post-op Note  Patient: Alexandra Walker  Procedure(s) Performed: Procedure(s): LAPAROSCOPIC CHOLECYSTECTOMY (N/A)  Patient Location: PACU  Anesthesia Type: General   Level of Consciousness: awake, alert  and oriented  Airway and Oxygen Therapy: Patient Spontanous Breathing  Post-op Pain: mild  Post-op Assessment: Post-op Vital signs reviewed  Post-op Vital Signs: Reviewed  Last Vitals:  Filed Vitals:   10/26/14 1224  BP: 145/76  Pulse: 53  Temp: 36.5 C  Resp: 16    Complications: No apparent anesthesia complications

## 2014-10-26 NOTE — Anesthesia Procedure Notes (Signed)
Procedure Name: Intubation Date/Time: 10/26/2014 8:35 AM Performed by: Marrianne Mood Pre-anesthesia Checklist: Patient identified, Emergency Drugs available, Suction available, Patient being monitored and Timeout performed Patient Re-evaluated:Patient Re-evaluated prior to inductionOxygen Delivery Method: Circle System Utilized Preoxygenation: Pre-oxygenation with 100% oxygen Intubation Type: IV induction Ventilation: Mask ventilation without difficulty Laryngoscope Size: Miller and 3 Grade View: Grade III Tube type: Oral Tube size: 7.0 mm Number of attempts: 1 Airway Equipment and Method: Stylet and Oral airway Placement Confirmation: ETT inserted through vocal cords under direct vision,  positive ETCO2 and breath sounds checked- equal and bilateral Secured at: 22 cm Tube secured with: Tape Dental Injury: Teeth and Oropharynx as per pre-operative assessment

## 2014-10-26 NOTE — Anesthesia Preprocedure Evaluation (Addendum)
Anesthesia Evaluation  Patient identified by MRN, date of birth, ID band Patient awake    Reviewed: Allergy & Precautions, NPO status , Patient's Chart, lab work & pertinent test results  History of Anesthesia Complications (+) PONV  Airway Mallampati: I  TM Distance: >3 FB Neck ROM: Full    Dental  (+) Teeth Intact, Dental Advisory Given   Pulmonary  breath sounds clear to auscultation        Cardiovascular hypertension, Pt. on medications and Pt. on home beta blockers Rhythm:Regular Rate:Normal     Neuro/Psych    GI/Hepatic GERD-  ,  Endo/Other  diabetes, Well Controlled, Oral Hypoglycemic AgentsMorbid obesity  Renal/GU      Musculoskeletal   Abdominal   Peds  Hematology   Anesthesia Other Findings   Reproductive/Obstetrics                            Anesthesia Physical Anesthesia Plan  ASA: III  Anesthesia Plan: General   Post-op Pain Management:    Induction: Intravenous  Airway Management Planned: Oral ETT  Additional Equipment:   Intra-op Plan:   Post-operative Plan: Extubation in OR  Informed Consent: I have reviewed the patients History and Physical, chart, labs and discussed the procedure including the risks, benefits and alternatives for the proposed anesthesia with the patient or authorized representative who has indicated his/her understanding and acceptance.   Dental advisory given  Plan Discussed with: CRNA, Anesthesiologist and Surgeon  Anesthesia Plan Comments:         Anesthesia Quick Evaluation

## 2014-10-26 NOTE — Transfer of Care (Signed)
Immediate Anesthesia Transfer of Care Note  Patient: Alexandra Walker  Procedure(s) Performed: Procedure(s): LAPAROSCOPIC CHOLECYSTECTOMY (N/A)  Patient Location: PACU  Anesthesia Type:General  Level of Consciousness: sedated and patient cooperative  Airway & Oxygen Therapy: Patient Spontanous Breathing and Patient connected to face mask oxygen  Post-op Assessment: Report given to RN and Post -op Vital signs reviewed and stable  Post vital signs: Reviewed and stable  Last Vitals:  Filed Vitals:   10/26/14 0755  BP: 161/75  Pulse: 67  Temp: 37.2 C  Resp: 20    Complications: No apparent anesthesia complications

## 2014-10-26 NOTE — Op Note (Signed)
Laparoscopic Cholecystectomy Procedure Note  Indications: This patient presents with symptomatic gallbladder disease and will undergo laparoscopic cholecystectomy.  Pre-operative Diagnosis: biliary dyskinesia and gallbladder polyps  Post-operative Diagnosis: Same  Surgeon: Coralie Keens A   Assistants: 0  Anesthesia: General endotracheal anesthesia  ASA Class: 2  Procedure Details  The patient was seen again in the Holding Room. The risks, benefits, complications, treatment options, and expected outcomes were discussed with the patient. The possibilities of reaction to medication, pulmonary aspiration, perforation of viscus, bleeding, recurrent infection, finding a normal gallbladder, the need for additional procedures, failure to diagnose a condition, the possible need to convert to an open procedure, and creating a complication requiring transfusion or operation were discussed with the patient. The likelihood of improving the patient's symptoms with return to their baseline status is good.  The patient and/or family concurred with the proposed plan, giving informed consent. The site of surgery properly noted. The patient was taken to Operating Room, identified as Alexandra Walker and the procedure verified as Laparoscopic Cholecystectomy with Intraoperative Cholangiogram. A Time Out was held and the above information confirmed.  Prior to the induction of general anesthesia, antibiotic prophylaxis was administered. General endotracheal anesthesia was then administered and tolerated well. After the induction, the abdomen was prepped with Chloraprep and draped in sterile fashion. The patient was positioned in the supine position.  Local anesthetic agent was injected into the skin near the umbilicus and an incision made. We dissected down to the abdominal fascia with blunt dissection.  The fascia was incised vertically and we entered the peritoneal cavity bluntly.  A pursestring suture of  0-Vicryl was placed around the fascial opening.  The Hasson cannula was inserted and secured with the stay suture.  Pneumoperitoneum was then created with CO2 and tolerated well without any adverse changes in the patient's vital signs. An 11-mm port was placed in the subxiphoid position.  Two 5-mm ports were placed in the right upper quadrant. All skin incisions were infiltrated with a local anesthetic agent before making the incision and placing the trocars.   We positioned the patient in reverse Trendelenburg, tilted slightly to the patient's left.  The gallbladder was identified and was found to be thick walled and intrahepatic , the fundus grasped and retracted cephalad. Adhesions were lysed bluntly and with the electrocautery where indicated, taking care not to injure any adjacent organs or viscus. The infundibulum was grasped and retracted laterally, exposing the peritoneum overlying the triangle of Calot. This was then divided and exposed in a blunt fashion. The cystic duct was clearly identified and bluntly dissected circumferentially. A critical view of the cystic duct and cystic artery was obtained.  The cystic duct was then ligated with clips and divided. The cystic artery was, dissected free, ligated with clips and divided as well.   The gallbladder was dissected from the liver bed in retrograde fashion with the electrocautery. The gallbladder was removed and placed in an Endocatch sac. The liver bed was irrigated and inspected. Hemostasis was achieved with the electrocautery. Copious irrigation was utilized and was repeatedly aspirated until clear.  The gallbladder and Endocatch sac were then removed through the umbilical port site.  The pursestring suture was used to close the umbilical fascia.    We again inspected the right upper quadrant for hemostasis.  Pneumoperitoneum was released as we removed the trocars.  4-0 Monocryl was used to close the skin.   Benzoin, steri-strips, and clean  dressings were applied. The patient was then extubated  and brought to the recovery room in stable condition. Instrument, sponge, and needle counts were correct at closure and at the conclusion of the case.   Findings: Cholecystitis without Cholelithiasis  Estimated Blood Loss: Minimal         Drains: 0         Specimens: Gallbladder           Complications: None; patient tolerated the procedure well.         Disposition: PACU - hemodynamically stable.         Condition: stable

## 2014-10-26 NOTE — Discharge Instructions (Signed)
[facility] ______[facility] °LAPAROSCOPIC SURGERY: POST OP INSTRUCTIONS °Always review your discharge instruction sheet given to you by the facility where your surgery was performed. °IF YOU HAVE DISABILITY OR FAMILY LEAVE FORMS, YOU MUST BRING THEM TO THE OFFICE FOR PROCESSING.   °DO NOT GIVE THEM TO YOUR DOCTOR. ° °1. A prescription for pain medication may be given to you upon discharge.  Take your pain medication as prescribed, if needed.  If narcotic pain medicine is not needed, then you may take acetaminophen (Tylenol) or ibuprofen (Advil) as needed. °2. Take your usually prescribed medications unless otherwise directed. °3. If you need a refill on your pain medication, please contact your pharmacy.  They will contact our office to request authorization. Prescriptions will not be filled after 5pm or on week-ends. °4. You should follow a light diet the first few days after arrival home, such as soup and crackers, etc.  Be sure to include lots of fluids daily. °5. Most patients will experience some swelling and bruising in the area of the incisions.  Ice packs will help.  Swelling and bruising can take several days to resolve.  °6. It is common to experience some constipation if taking pain medication after surgery.  Increasing fluid intake and taking a stool softener (such as Colace) will usually help or prevent this problem from occurring.  A mild laxative (Milk of Magnesia or Miralax) should be taken according to package instructions if there are no bowel movements after 48 hours. °7. Unless discharge instructions indicate otherwise, you may remove your bandages 24-48 hours after surgery, and you may shower at that time.  You may have steri-strips (small skin tapes) in place directly over the incision.  These strips should be left on the skin for 7-10 days.  If your surgeon used skin glue on the incision, you may shower in 24 hours.  The glue will flake off over the next 2-3 weeks.  Any sutures or  staples will be removed at the office during your follow-up visit. °8. ACTIVITIES:  You may resume regular (light) daily activities beginning the next day--such as daily self-care, walking, climbing stairs--gradually increasing activities as tolerated.  You may have sexual intercourse when it is comfortable.  Refrain from any heavy lifting or straining until approved by your doctor. °a. You may drive when you are no longer taking prescription pain medication, you can comfortably wear a seatbelt, and you can safely maneuver your car and apply brakes. °b. RETURN TO WORK:  __________________________________________________________ °9. You should see your doctor in the office for a follow-up appointment approximately 2-3 weeks after your surgery.  Make sure that you call for this appointment within a day or two after you arrive home to insure a convenient appointment time. °10. OTHER INSTRUCTIONS: __________________________________________________________________________________________________________________________ __________________________________________________________________________________________________________________________ °WHEN TO CALL YOUR DOCTOR: °1. Fever over 101.0 °2. Inability to urinate °3. Continued bleeding from incision. °4. Increased pain, redness, or drainage from the incision. °5. Increasing abdominal pain ° °The clinic staff is available to answer your questions during regular business hours.  Please don’t hesitate to call and ask to speak to one of the nurses for clinical concerns.  If you have a medical emergency, go to the nearest emergency room or call 911.  A surgeon from [facility] is always on call at the hospital. °[address], [address], [place], [place]  [address] ? [address] [address], [place], [place]   [address] °[phone] ? [phone] ? FAX [phone] °Web site:   [email or web address] ° °Post Anesthesia Home Care Instructions ° °Activity: °Get  plenty of rest for the remainder of the day. A responsible adult should stay with you for 24 hours following the procedure.  °For the next 24 hours, DO NOT: °-Drive a car °-Operate machinery °-Drink alcoholic beverages °-Take any medication unless instructed by your physician °-Make any legal decisions or sign important papers. ° °Meals: °Start with liquid foods such as gelatin or soup. Progress to regular foods as tolerated. Avoid greasy, spicy, heavy foods. If nausea and/or vomiting occur, drink only clear liquids until the nausea and/or vomiting subsides. Call your physician if vomiting continues. ° °Special Instructions/Symptoms: °Your throat may feel dry or sore from the anesthesia or the breathing tube placed in your throat during surgery. If this causes discomfort, gargle with warm salt water. The discomfort should disappear within 24 hours. ° °If you had a scopolamine patch placed behind your ear for the management of post- operative nausea and/or vomiting: ° °1. The medication in the patch is effective for 72 hours, after which it should be removed.  Wrap patch in a tissue and discard in the trash. Wash hands thoroughly with soap and water. °2. You may remove the patch earlier than 72 hours if you experience unpleasant side effects which may include dry mouth, dizziness or visual disturbances. °3. Avoid touching the patch. Wash your hands with soap and water after contact with the patch. °  ° °

## 2014-10-26 NOTE — Interval H&P Note (Signed)
History and Physical Interval Note:no change in H and P  10/26/2014 7:51 AM  Alexandra Walker  has presented today for surgery, with the diagnosis of Gallbladder Adenomas  The various methods of treatment have been discussed with the patient and family. After consideration of risks, benefits and other options for treatment, the patient has consented to  Procedure(s): LAPAROSCOPIC CHOLECYSTECTOMY (N/A) as a surgical intervention .  The patient's history has been reviewed, patient examined, no change in status, stable for surgery.  I have reviewed the patient's chart and labs.  Questions were answered to the patient's satisfaction.     [name]

## 2014-10-27 ENCOUNTER — Encounter (HOSPITAL_BASED_OUTPATIENT_CLINIC_OR_DEPARTMENT_OTHER): Payer: Self-pay | Admitting: Surgery

## 2014-11-01 NOTE — Addendum Note (Signed)
Addendum  created 11/01/14 1152 by Lorrene Reid, MD   Modules edited: Clinical Notes   Clinical Notes:  File: [id number]

## 2015-03-16 ENCOUNTER — Other Ambulatory Visit (HOSPITAL_BASED_OUTPATIENT_CLINIC_OR_DEPARTMENT_OTHER): Payer: Self-pay | Admitting: Family Medicine

## 2015-03-16 DIAGNOSIS — Z1231 Encounter for screening mammogram for malignant neoplasm of breast: Secondary | ICD-10-CM

## 2015-03-20 ENCOUNTER — Ambulatory Visit (HOSPITAL_BASED_OUTPATIENT_CLINIC_OR_DEPARTMENT_OTHER)
Admission: RE | Admit: 2015-03-20 | Discharge: 2015-03-20 | Disposition: A | Discharge status: Home / Self Care | Payer: 59 | Source: Ambulatory Visit | Attending: Family Medicine | Admitting: Family Medicine

## 2015-03-20 DIAGNOSIS — Z1231 Encounter for screening mammogram for malignant neoplasm of breast: Secondary | ICD-10-CM | POA: Insufficient documentation

## 2015-05-10 DIAGNOSIS — G43009 Migraine without aura, not intractable, without status migrainosus: Secondary | ICD-10-CM

## 2015-05-10 DIAGNOSIS — K219 Gastro-esophageal reflux disease without esophagitis: Secondary | ICD-10-CM | POA: Insufficient documentation

## 2015-05-10 DIAGNOSIS — I499 Cardiac arrhythmia, unspecified: Secondary | ICD-10-CM | POA: Insufficient documentation

## 2015-05-10 DIAGNOSIS — E1169 Type 2 diabetes mellitus with other specified complication: Secondary | ICD-10-CM | POA: Insufficient documentation

## 2015-05-10 DIAGNOSIS — J309 Allergic rhinitis, unspecified: Secondary | ICD-10-CM | POA: Insufficient documentation

## 2015-05-10 DIAGNOSIS — F32A Depression, unspecified: Secondary | ICD-10-CM | POA: Insufficient documentation

## 2015-05-10 DIAGNOSIS — E782 Mixed hyperlipidemia: Secondary | ICD-10-CM | POA: Insufficient documentation

## 2015-05-10 DIAGNOSIS — F329 Major depressive disorder, single episode, unspecified: Secondary | ICD-10-CM | POA: Insufficient documentation

## 2015-05-10 DIAGNOSIS — E559 Vitamin D deficiency, unspecified: Secondary | ICD-10-CM | POA: Insufficient documentation

## 2015-05-10 DIAGNOSIS — E785 Hyperlipidemia, unspecified: Secondary | ICD-10-CM

## 2015-05-10 DIAGNOSIS — R609 Edema, unspecified: Secondary | ICD-10-CM | POA: Insufficient documentation

## 2015-05-10 DIAGNOSIS — R809 Proteinuria, unspecified: Secondary | ICD-10-CM | POA: Insufficient documentation

## 2015-05-10 HISTORY — DX: Migraine without aura, not intractable, without status migrainosus: G43.009

## 2015-05-21 DIAGNOSIS — Z8719 Personal history of other diseases of the digestive system: Secondary | ICD-10-CM

## 2015-05-21 HISTORY — DX: Personal history of other diseases of the digestive system: Z87.19

## 2015-05-23 MED FILL — CYCLOBENZAPRINE 5 MG TABLET: 5 | 30 days supply | Qty: 30 | Fill #1

## 2015-05-23 MED FILL — HYDROCHLOROTHIAZIDE 25 MG T: 25 | 90 days supply | Qty: 90 | Fill #0

## 2015-05-31 DIAGNOSIS — G479 Sleep disorder, unspecified: Secondary | ICD-10-CM | POA: Insufficient documentation

## 2015-05-31 DIAGNOSIS — M67462 Ganglion, left knee: Secondary | ICD-10-CM | POA: Insufficient documentation

## 2015-05-31 DIAGNOSIS — Z6841 Body Mass Index (BMI) 40.0 and over, adult: Secondary | ICD-10-CM | POA: Insufficient documentation

## 2015-05-31 DIAGNOSIS — M720 Palmar fascial fibromatosis [Dupuytren]: Secondary | ICD-10-CM | POA: Insufficient documentation

## 2015-05-31 DIAGNOSIS — M40209 Unspecified kyphosis, site unspecified: Secondary | ICD-10-CM | POA: Insufficient documentation

## 2015-05-31 DIAGNOSIS — R945 Abnormal results of liver function studies: Secondary | ICD-10-CM

## 2015-05-31 DIAGNOSIS — M659 Synovitis and tenosynovitis, unspecified: Secondary | ICD-10-CM | POA: Insufficient documentation

## 2015-05-31 DIAGNOSIS — M7711 Lateral epicondylitis, right elbow: Secondary | ICD-10-CM | POA: Insufficient documentation

## 2015-05-31 DIAGNOSIS — N943 Premenstrual tension syndrome: Secondary | ICD-10-CM | POA: Insufficient documentation

## 2015-05-31 DIAGNOSIS — L601 Onycholysis: Secondary | ICD-10-CM | POA: Insufficient documentation

## 2015-05-31 DIAGNOSIS — R7989 Other specified abnormal findings of blood chemistry: Secondary | ICD-10-CM | POA: Insufficient documentation

## 2015-06-01 DIAGNOSIS — R1011 Right upper quadrant pain: Secondary | ICD-10-CM | POA: Diagnosis not present

## 2015-06-01 DIAGNOSIS — E785 Hyperlipidemia, unspecified: Secondary | ICD-10-CM | POA: Diagnosis not present

## 2015-06-01 DIAGNOSIS — M79671 Pain in right foot: Secondary | ICD-10-CM | POA: Diagnosis not present

## 2015-06-01 DIAGNOSIS — E1169 Type 2 diabetes mellitus with other specified complication: Secondary | ICD-10-CM | POA: Diagnosis not present

## 2015-06-01 DIAGNOSIS — R809 Proteinuria, unspecified: Secondary | ICD-10-CM | POA: Diagnosis not present

## 2015-06-01 DIAGNOSIS — E118 Type 2 diabetes mellitus with unspecified complications: Secondary | ICD-10-CM | POA: Diagnosis not present

## 2015-06-01 DIAGNOSIS — E782 Mixed hyperlipidemia: Secondary | ICD-10-CM | POA: Diagnosis not present

## 2015-06-01 DIAGNOSIS — I10 Essential (primary) hypertension: Secondary | ICD-10-CM | POA: Diagnosis not present

## 2015-06-01 MED FILL — DICLOFENAC SODIUM 1% GEL: 1 | 25 days supply | Qty: 100 | Fill #0

## 2015-06-22 DIAGNOSIS — M79671 Pain in right foot: Secondary | ICD-10-CM | POA: Diagnosis not present

## 2015-06-22 DIAGNOSIS — M7741 Metatarsalgia, right foot: Secondary | ICD-10-CM | POA: Diagnosis not present

## 2015-07-05 MED FILL — LOSARTAN POTASSIUM 50 MG TA: 50 | 90 days supply | Qty: 90 | Fill #1

## 2015-07-11 MED FILL — CYCLOBENZAPRINE 5 MG TABLET: 5 | 30 days supply | Qty: 30 | Fill #2

## 2015-07-21 ENCOUNTER — Other Ambulatory Visit: Payer: Self-pay | Admitting: Obstetrics and Gynecology

## 2015-07-21 DIAGNOSIS — N951 Menopausal and female climacteric states: Secondary | ICD-10-CM | POA: Diagnosis not present

## 2015-07-21 DIAGNOSIS — N72 Inflammatory disease of cervix uteri: Secondary | ICD-10-CM | POA: Diagnosis not present

## 2015-07-21 DIAGNOSIS — Z6841 Body Mass Index (BMI) 40.0 and over, adult: Secondary | ICD-10-CM | POA: Diagnosis not present

## 2015-07-21 DIAGNOSIS — Z01419 Encounter for gynecological examination (general) (routine) without abnormal findings: Secondary | ICD-10-CM | POA: Diagnosis not present

## 2015-07-21 DIAGNOSIS — Z8742 Personal history of other diseases of the female genital tract: Secondary | ICD-10-CM | POA: Diagnosis not present

## 2015-07-21 DIAGNOSIS — N950 Postmenopausal bleeding: Secondary | ICD-10-CM | POA: Diagnosis not present

## 2015-08-01 ENCOUNTER — Other Ambulatory Visit: Payer: Self-pay | Admitting: Obstetrics and Gynecology

## 2015-08-01 DIAGNOSIS — N950 Postmenopausal bleeding: Secondary | ICD-10-CM

## 2015-08-01 DIAGNOSIS — IMO0002 Reserved for concepts with insufficient information to code with codable children: Secondary | ICD-10-CM

## 2015-08-05 DIAGNOSIS — IMO0002 Reserved for concepts with insufficient information to code with codable children: Secondary | ICD-10-CM | POA: Insufficient documentation

## 2015-08-05 DIAGNOSIS — H6121 Impacted cerumen, right ear: Secondary | ICD-10-CM | POA: Diagnosis not present

## 2015-08-05 DIAGNOSIS — N950 Postmenopausal bleeding: Secondary | ICD-10-CM | POA: Insufficient documentation

## 2015-08-17 DIAGNOSIS — L237 Allergic contact dermatitis due to plants, except food: Secondary | ICD-10-CM | POA: Diagnosis not present

## 2015-09-04 NOTE — Progress Notes (Signed)
Cardiology Office Note:    Date:  09/05/2015   ID:  MYNA FREIMARK, DOB [birth year], MRN [id number]  PCP:  Garlan Fillers, MD  Cardiologist:  Dr. Lauree [place]   Electrophysiologist:  n/a  Referring MD: Garlan Fillers, MD   Chief Complaint  Patient presents with  . Surgical Clearance    History of Present Illness:     Alexandra Walker is a 60 y.o. female with a hx of HTN, HL, DM2, PACs, IBS, esophageal stricture s/p dilatation, chronic R sided CP.  She has had several normal stress tests in the past.  Previously followed by Dr. Linus Salmons in [place].  Now sees Dr. Lauree [place].  Last seen in 6/15.    She needs a hysterectomy with Dr. Kendall Flack. This is scheduled for May. She needs surgical clearance. Since last seen, she has been doing well. She has chronic right-sided chest pain related to nerve impingement from kyphosis. This has been chronic and stable for years. She denies any changes. She is very active and can do several hours of yard work without exertional chest discomfort or shortness of breath. She denies arm or jaw pain. Denies syncope. Denies orthopnea, PND. She has chronic ankle edema without significant change.   Past Medical History  Diagnosis Date  . Hypertension   . Hyperlipidemia   . IBS (irritable bowel syndrome)   . Glucose intolerance (impaired glucose tolerance)   . PAC (premature atrial contraction)   . Arthritis   . Seasonal allergies   . Sacroiliitis ([place])   . HLA B27 (HLA B27 positive)   . PONV (postoperative nausea and vomiting)   . Paronychia   . Trigger ring finger of left hand   . Diabetes ([place])   . Ischemic colitis ([place])   . Lymphadenopathy   . Chronic depressive disorder   . Circadian rhythm sleep disorder   . Ganglion of knee     left  . Kyphosis   . Lateral epicondylitis of right elbow   . Migraine headache   . Morbid obesity ([place])   . Onycholysis   . PMS (premenstrual syndrome)   . Vitamin D deficiency   . GERD  (gastroesophageal reflux disease)     Past Surgical History  Procedure Laterality Date  . Abdominal laparascopic  92  . Left finger surgery    . Colonoscopy w/ biopsies  multiple  . Esophagogastroduodenoscopy  multiple  . Knee arthroscopy  07,10    left  . Dilation and curettage of uterus  07,11  . Breast surgery  2003    bx lt-neg  . Cervical cone biopsy    . Mass excision Left 12/24/2012    Procedure: LEFT EXCISION OF PALMAR MASS X TWO;  Surgeon: Roseanne Kaufman, MD;  Location: [facility];  Service: Orthopedics;  Laterality: Left;  . Diagnostic laparoscopy    . Cholecystectomy N/A 10/26/2014    Procedure: LAPAROSCOPIC CHOLECYSTECTOMY;  Surgeon: Coralie Keens, MD;  Location: [facility];  Service: General;  Laterality: N/A;    Current Medications: Outpatient Prescriptions Prior to Visit  Medication Sig Dispense Refill  . cholecalciferol (VITAMIN D) 1000 UNITS tablet Take 1,000 Units by mouth daily.    . cyclobenzaprine (FLEXERIL) 5 MG tablet Take 1 tablet (5 mg total) by mouth at bedtime as needed for muscle spasms. 30 tablet 2  . hydrochlorothiazide 25 MG tablet Take 25 mg by mouth daily. Once daily    . ibuprofen (MOTRIN IB) 200 MG tablet Take  200 mg by mouth every 6 (six) hours as needed (FOR PAIN).     Marland Kitchen losartan (COZAAR) 50 MG tablet Take 50 mg by mouth daily.    . metFORMIN (GLUCOPHAGE) 500 MG tablet Take 1,000 mg by mouth 2 (two) times daily with a meal.    . metoprolol succinate (TOPROL-XL) 50 MG 24 hr tablet Take 50 mg by mouth daily. Take with or immediately following a meal.    . Multiple Vitamin (MULTIVITAMIN) tablet Take 1 tablet by mouth daily.    . Omega-3 Fatty Acids (FISH OIL PO) Take 1 tablet by mouth daily.    . simvastatin (ZOCOR) 40 MG tablet Take 40 mg by mouth every evening.    . pantoprazole (PROTONIX) 40 MG tablet Take 1 tablet (40 mg total) by mouth daily before breakfast. (Patient not taking: Reported on 09/05/2015) 30 tablet 1    . traMADol (ULTRAM) 50 MG tablet Take 1-2 tablets (50-100 mg total) by mouth every 6 (six) hours as needed. (Patient not taking: Reported on 09/05/2015) 40 tablet 0   No facility-administered medications prior to visit.     Allergies:   Ace inhibitors; Percocet; Vicodin; and Victoza   Social History   Social History  . Marital Status: Divorced    Spouse Name: N/A  . Number of Children: 1  . Years of Education: N/A   Occupational History  . [employer]   Social History Main Topics  . Smoking status: Never Smoker   . Smokeless tobacco: Never Used  . Alcohol Use: 0.0 oz/week    0 Standard drinks or equivalent per week     Comment: social  . Drug Use: No  . Sexual Activity: Not Asked   Other Topics Concern  . None   Social History Narrative   Nurse midwife with the [employer]     Family History:  The patient's family history includes Coronary artery disease in her mother; Heart attack in her father and mother; Heart disease in her father and mother; Hypertension in her brother, mother, and sister. There is no history of Colon cancer.   ROS:   Please see the history of present illness.    Review of Systems  Cardiovascular: Positive for irregular heartbeat and leg swelling.  Musculoskeletal: Positive for back pain.  Gastrointestinal: Positive for diarrhea.   All other systems reviewed and are negative.   Physical Exam:    VS:  BP 124/80 mmHg  Pulse 56  Ht '5\' 7"'  (1.702 m)  Wt 249 lb 3.2 oz (113.036 kg)  BMI 39.02 kg/m2   GEN: Well nourished, well developed, in no acute distress HEENT: normal Neck: no JVD, no masses Cardiac: Normal S1/S2, RRR; no murmurs, rubs, or gallops, no edema;  no carotid bruits,   Respiratory:  clear to auscultation bilaterally; no wheezing, rhonchi or rales GI: soft, nontender, nondistended MS: no deformity or atrophy Skin: warm and dry Neuro: No focal deficits  Psych: Alert and oriented x 3, normal  affect  Wt Readings from Last 3 Encounters:  09/05/15 249 lb 3.2 oz (113.036 kg)  10/26/14 249 lb (112.946 kg)  09/01/14 250 lb (113.399 kg)     Studies/Labs Reviewed:     EKG:  EKG is  ordered today.  The ekg ordered today demonstrates Sinus bradycardia, HR 56, LAD, IVCD, QTc 420 ms, poor R-wave progression, nonspecific ST-T wave changes, no change from prior tracing  Recent Labs: 10/24/2014: BUN 9; Creatinine, Ser 0.66; Potassium 4.6; Sodium  138 10/26/2014: Hemoglobin 13.9   Recent Lipid Panel No results found for: CHOL, TRIG, HDL, CHOLHDL, VLDL, LDLCALC, LDLDIRECT  Additional studies/ records that were reviewed today include:    Myoview 3/09 No ischemia or scar, EF 71%  Carotid US 12/07 Normal   ASSESSMENT:     1. Pre-operative cardiovascular examination   2. Essential hypertension   3. Hyperlipidemia   4. PAC (premature atrial contraction)     PLAN:     In order of problems listed above:  1. Surgical clearance - The patient does not have any unstable cardiac conditions.  Upon evaluation today, she can achieve 4 METs or greater without anginal symptoms.  According to [facility] and AHA guidelines, she requires no further cardiac workup prior to her noncardiac surgery and should be at acceptable risk.  Our service is available as necessary in the perioperative period.   2. HTN - Blood pressure controlled.  3. HL - Managed by primary care. Continue statin.  4. PACs - Fair control with beta blocker therapy.   Medication Adjustments/Labs and Tests Ordered: Current medicines are reviewed at length with the patient today.  Concerns regarding medicines are outlined above.  Medication changes, Labs and Tests ordered today are outlined in the Patient Instructions noted below. Patient Instructions  Medication Instructions:  Your physician recommends that you continue on your current medications as directed. Please refer to the Current Medication list given to you  today. Labwork: NONE Testing/Procedures: NONE Follow-Up: Your physician wants you to follow-up in: [place]. You will receive a reminder letter in the mail two months in advance. If you don't receive a letter, please call our office to schedule the follow-up appointment. Any Other Special Instructions Will Be Listed Below (If Applicable). If you need a refill on your cardiac medications before your next appointment, please call your pharmacy.     Signed, Richardson Dopp, PA-C  09/05/2015 8:54 AM    [name] [place], [place], [place]  [address] Phone: [phone]; Fax: [phone]

## 2015-09-05 ENCOUNTER — Ambulatory Visit (INDEPENDENT_AMBULATORY_CARE_PROVIDER_SITE_OTHER): Payer: 59 | Admitting: Physician Assistant

## 2015-09-05 ENCOUNTER — Encounter: Payer: Self-pay | Admitting: Physician Assistant

## 2015-09-05 VITALS — BP 124/80 | HR 56 | Ht 67.0 in | Wt 249.2 lb

## 2015-09-05 DIAGNOSIS — I491 Atrial premature depolarization: Secondary | ICD-10-CM | POA: Diagnosis not present

## 2015-09-05 DIAGNOSIS — E785 Hyperlipidemia, unspecified: Secondary | ICD-10-CM

## 2015-09-05 DIAGNOSIS — I10 Essential (primary) hypertension: Secondary | ICD-10-CM

## 2015-09-05 DIAGNOSIS — Z01810 Encounter for preprocedural cardiovascular examination: Secondary | ICD-10-CM | POA: Diagnosis not present

## 2015-09-05 NOTE — Patient Instructions (Addendum)
Medication Instructions:  Your physician recommends that you continue on your current medications as directed. Please refer to the Current Medication list given to you today. Labwork: NONE Testing/Procedures: NONE Follow-Up: Your physician wants you to follow-up in: [place]. You will receive a reminder letter in the mail two months in advance. If you don't receive a letter, please call our office to schedule the follow-up appointment. Any Other Special Instructions Will Be Listed Below (If Applicable). If you need a refill on your cardiac medications before your next appointment, please call your pharmacy.

## 2015-09-07 MED FILL — CLOBETASOL 0.05% CREAM: 0.05 | 30 days supply | Qty: 60 | Fill #0

## 2015-09-07 MED FILL — METOPROLOL SUCC ER 50 MG TA: 50 | 90 days supply | Qty: 90 | Fill #3

## 2015-09-07 MED FILL — HYDROCHLOROTHIAZIDE 25 MG T: 25 | 90 days supply | Qty: 90 | Fill #1

## 2015-09-07 MED FILL — SIMVASTATIN 40 MG TABLET: 40 | 90 days supply | Qty: 90 | Fill #1

## 2015-09-22 NOTE — Patient Instructions (Signed)
Your procedure is scheduled on:  Tuesday, Oct 03, 2015  Enter through the Main Entrance of [facility] at:  6:00 AM  Pick up the phone at the desk and dial [phone].  Call this number if you have problems the morning of surgery: [phone].  Remember: Do NOT eat food or drink after: Midnight Monday, Oct 02, 2015  Take these medicines the morning of surgery with a SIP OF WATER: Hydrochlorothiazide, Metoprolol, Losartan, Protonix  Do NOT take evening dose of Metformin the night before surgery.  Stop taking fish oil at this time.  Do NOT wear jewelry (body piercing), metal hair clips/bobby pins, make-up, or nail polish. Do NOT wear lotions, powders, or perfumes.  You may wear deodorant. Do NOT shave for 48 hours prior to surgery. Do NOT bring valuables to the hospital. Contacts, dentures, or bridgework may not be worn into surgery.  Leave suitcase in car.  After surgery it may be brought to your room.  For patients admitted to the hospital, checkout time is 11:00 AM the day of discharge.

## 2015-09-25 ENCOUNTER — Encounter (HOSPITAL_COMMUNITY)
Admission: RE | Admit: 2015-09-25 | Discharge: 2015-09-25 | Disposition: A | Discharge status: Home / Self Care | Payer: 59 | Source: Ambulatory Visit | Attending: Obstetrics and Gynecology | Admitting: Obstetrics and Gynecology

## 2015-09-25 ENCOUNTER — Encounter (HOSPITAL_COMMUNITY): Payer: Self-pay

## 2015-09-25 DIAGNOSIS — Z01812 Encounter for preprocedural laboratory examination: Secondary | ICD-10-CM | POA: Insufficient documentation

## 2015-09-25 HISTORY — DX: Antiphospholipid syndrome: D68.61

## 2015-09-25 HISTORY — DX: Edema, unspecified: R60.9

## 2015-09-25 LAB — CBC
HCT: 42.2 % (ref 36.0–46.0)
HEMOGLOBIN: 13.8 g/dL (ref 12.0–15.0)
MCH: 27.8 pg (ref 26.0–34.0)
MCHC: 32.7 g/dL (ref 30.0–36.0)
MCV: 84.9 fL (ref 78.0–100.0)
Platelets: 249 10*3/uL (ref 150–400)
RBC: 4.97 MIL/uL (ref 3.87–5.11)
RDW: 13.3 % (ref 11.5–15.5)
WBC: 7.4 10*3/uL (ref 4.0–10.5)

## 2015-09-25 LAB — BASIC METABOLIC PANEL
Anion gap: 8 (ref 5–15)
BUN: 20 mg/dL (ref 6–20)
CHLORIDE: 103 mmol/L (ref 101–111)
CO2: 29 mmol/L (ref 22–32)
CREATININE: 0.77 mg/dL (ref 0.44–1.00)
Calcium: 9.3 mg/dL (ref 8.9–10.3)
GFR calc Af Amer: >60 mL/min (ref >60)
GFR calc non Af Amer: >60 mL/min (ref >60)
Glucose, Bld: 107 mg/dL — ABNORMAL HIGH (ref 65–99)
Potassium: 3.9 mmol/L (ref 3.5–5.1)
Sodium: 140 mmol/L (ref 135–145)

## 2015-09-28 ENCOUNTER — Other Ambulatory Visit (HOSPITAL_COMMUNITY): Payer: Self-pay | Admitting: Obstetrics and Gynecology

## 2015-09-28 NOTE — H&P (Signed)
Alexandra Walker is a 60 y.o. 60 YO female,  P: 0-0-1-0 presents for hysterectomy because of post menopausal bleeding. For the past 7 years the patient has has vaginal spotting with evaluations that included endometrial biopsies, hysteroscopies and dilatations with curettage.  Pathology from each procedure returned benign. This, in spite of a Mirena IUD from 2011-2015.  Over the past year, the patient reports daily spotting that is primarily brown and right sided pelvic pain "twinges" most days of the week.  She will take Advil on occasion for this discomfort but has not identified any precipitating factor associated with its onset.   A pelvic ultrasound in 2016 revealed:  uterus: (retroverted) 3.47 x  4.00 x 2.71 cm; endometrium: 1.54 mm;  #2 intramural fibroids: anterior-0.86 x 0.56 x 0.88 cm, posterior-0.85 x 0.32 x 0.64 cm;  left ovary-1.93 x 1.12 x 0.76 cm and right ovary-1.46 x 0.82 x 0.77 cm.  The patient denies any bowel or bladder function complaints though she does have chronic diarrhea related to IBS.  A review of both medical and surgical management options were given to the patient for management of her bleeding.  In view of the chronicity of her symptoms the patient has decided to proceed with definitive therapy in the form of hysterectomy.   Past Medical History  OB History: G:1;  P: 0-0-1-0  GYN History: menarche: 60 YO;       The patient denies history of sexually transmitted disease.  Has a  history of cervical dysplasia  treated with Cold Knife Conization 2003;  Last PAP smear: March 2017-normal  Medical History: Depression, Cardiac Arrhythmias (PVCs & PJCs),  Hypertension, Hypercholesterolemia, Diabetes Mellitus, Pancreatitis (secondary to Victoza), Vitamin D Deficiency,  GERD,  Migraine, HLA-V27 Auto-Immune Sacroiliitis, Right Elbow Tendinitis, Left Knee Baker's Cyst,  Left Dupuytren's Contracture, Renal Stone, Chronic Ankle Edema and Circadian Rhythm Sleep Disorder  Surgical  History:1983 Pilonidal Cyst Surgery; 1997 Resection of Endometrial Polyp;  1998 Dilatatiion and Evacuation for SAB; 1999 Laparoscopy for Infertility; 1999 Liver Biopsy;   2003  Cold Knife Cervical Conization;  2003 Left Breast Biopsy-Fibroadenoma; 2005 Transobturator Sling Placement; 2006 Right Breast Biopsy-benign;  2010 & 2012 Left Knee Meniscal Repair; 2011 Left Dupuytren's Contracture Surgery; 2011 Hysteroscopic Polypectomy;  2014  Left Breast Biopsy-Benign  and 2016  Laparoscopic Cholecystectomy Denies problems with anesthesia, (though succinyl choline causes severe generalized myalgias) nor a  history of blood transfusions  Family History: Diabetes Mellitus, Hypertension, Cardiovascular Disease, Depression,  Renal Disease and Multiple Myeloma  Social History: 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 aspirin 325 MG tablet Take 325 mg by mouth daily.  . cholecalciferol (VITAMIN D) 1000 UNITS tablet Take 1,000 Units by mouth daily.  . cyclobenzaprine (FLEXERIL) 5 MG tablet Take 1 tablet (5 mg total) by mouth at bedtime as needed for muscle spasms.  . hydrochlorothiazide 25 MG tablet Take 25 mg by mouth daily. Once daily  . ibuprofen (MOTRIN IB) 200 MG tablet Take 200 mg by mouth every 6 (six) hours as needed (FOR PAIN).   Marland Kitchen losartan (COZAAR) 50 MG tablet Take 50 mg by mouth daily.  . Magnesium 500 MG CAPS Take 1 capsule by mouth at bedtime.  . metFORMIN (GLUCOPHAGE) 500 MG tablet Take 1,000 mg by mouth 2 (two) times daily with a meal.  . metoprolol succinate (TOPROL-XL) 50 MG 24 hr tablet Take 50 mg  by mouth daily. Take with or immediately following a meal.  . Multiple Vitamin (MULTIVITAMIN) tablet Take 1 tablet by mouth daily.  . Omega-3 Fatty Acids (FISH OIL PO) Take 1 tablet by mouth daily.  . pantoprazole (PROTONIX) 40 MG tablet Take 40 mg by mouth daily as needed (FOR  UPSET STOMACH).  Marland Kitchen simvastatin (ZOCOR) 40 MG tablet Take 40 mg by mouth every evening.  . Turmeric 450 MG CAPS Take 900 mg by mouth daily.   No facility-administered encounter medications on file as of 09/28/2015.    Allergies  Allergen Reactions  . Ace Inhibitors Other (See Comments)    coughing  . Percocet [Oxycodone-Acetaminophen] Nausea And Vomiting  . Vicodin [Hydrocodone-Acetaminophen] Nausea And Vomiting  . Victoza [Liraglutide] Other (See Comments)    pancreatitis   Denies sensitivity to peanuts, shellfish, soy or  latex. Has reacted to adhesives applied to her trunk area but tolerant of surgical glue   Physical Exam  Bp: 138/70          P: 64        R: 20     Temperature:  99.1 degrees F orally    Weight:  249 lbs.  Height: 5'6"    BMI: 40.2  Neck: supple without masses or thyromegaly Lungs: clear to auscultation Heart: regular rate and rhythm Abdomen: soft, diffuse right lower quadrant tenderness with no guarding or rebound; no organomegaly Pelvic:EGBUS- wnl; vagina-mildly atrophic; uterus-normal size, cervix  stenotic  without lesions or motion tenderness; adnexae-no tenderness or masses Extremities:  no clubbing, cyanosis or edema   Assesment:  Post Menopausal Bleeding                       Right Pelvic Pain                       History of Cervical Dysplasia   Disposition:  A discussion was held with patient regarding the indication for her procedure(s) along with the risks, which include but are not limited to:  reaction to anesthesia, damage to adjacent organs, infection and excessive bleeding. The patient verbalized understanding of these risks and has consented to proceed with a Laparoscopically Assisted Vaginal Hysterectomy with Bilateral Salpingectomy at [facility] on Oct 03, 2015 at 7:30 a. m.   CSN# [id number]   [place] J. Florene Glen, PA-C  for Dr. Seymour Bars. [place]

## 2015-10-02 MED ORDER — DEXTROSE 5 % IV SOLN
2.0000 g | INTRAVENOUS | Status: AC
  Administered 2015-10-03: 2 g via INTRAVENOUS
  Filled 2015-10-02: qty 2

## 2015-10-03 ENCOUNTER — Encounter (HOSPITAL_COMMUNITY)
Admission: RE | Disposition: A | Discharge status: Home / Self Care | Payer: Self-pay | Source: Ambulatory Visit | Attending: Obstetrics and Gynecology

## 2015-10-03 ENCOUNTER — Ambulatory Visit (HOSPITAL_COMMUNITY): Payer: 59 | Admitting: Anesthesiology

## 2015-10-03 ENCOUNTER — Encounter (HOSPITAL_COMMUNITY): Payer: Self-pay | Admitting: Emergency Medicine

## 2015-10-03 ENCOUNTER — Observation Stay (HOSPITAL_COMMUNITY)
Admission: RE | Admit: 2015-10-03 | Discharge: 2015-10-04 | Disposition: A | Discharge status: Home / Self Care | Payer: 59 | Source: Ambulatory Visit | Attending: Obstetrics and Gynecology | Admitting: Obstetrics and Gynecology

## 2015-10-03 DIAGNOSIS — K589 Irritable bowel syndrome without diarrhea: Secondary | ICD-10-CM | POA: Diagnosis not present

## 2015-10-03 DIAGNOSIS — E119 Type 2 diabetes mellitus without complications: Secondary | ICD-10-CM | POA: Diagnosis not present

## 2015-10-03 DIAGNOSIS — D252 Subserosal leiomyoma of uterus: Secondary | ICD-10-CM | POA: Insufficient documentation

## 2015-10-03 DIAGNOSIS — E559 Vitamin D deficiency, unspecified: Secondary | ICD-10-CM | POA: Insufficient documentation

## 2015-10-03 DIAGNOSIS — J9811 Atelectasis: Secondary | ICD-10-CM | POA: Diagnosis not present

## 2015-10-03 DIAGNOSIS — R079 Chest pain, unspecified: Secondary | ICD-10-CM | POA: Insufficient documentation

## 2015-10-03 DIAGNOSIS — D6861 Antiphospholipid syndrome: Secondary | ICD-10-CM | POA: Diagnosis not present

## 2015-10-03 DIAGNOSIS — R0902 Hypoxemia: Secondary | ICD-10-CM | POA: Insufficient documentation

## 2015-10-03 DIAGNOSIS — Z6839 Body mass index (BMI) 39.0-39.9, adult: Secondary | ICD-10-CM | POA: Diagnosis not present

## 2015-10-03 DIAGNOSIS — IMO0002 Reserved for concepts with insufficient information to code with codable children: Secondary | ICD-10-CM | POA: Diagnosis present

## 2015-10-03 DIAGNOSIS — M1990 Unspecified osteoarthritis, unspecified site: Secondary | ICD-10-CM | POA: Diagnosis not present

## 2015-10-03 DIAGNOSIS — Z885 Allergy status to narcotic agent status: Secondary | ICD-10-CM | POA: Insufficient documentation

## 2015-10-03 DIAGNOSIS — R102 Pelvic and perineal pain: Secondary | ICD-10-CM | POA: Diagnosis not present

## 2015-10-03 DIAGNOSIS — G4720 Circadian rhythm sleep disorder, unspecified type: Secondary | ICD-10-CM | POA: Insufficient documentation

## 2015-10-03 DIAGNOSIS — F329 Major depressive disorder, single episode, unspecified: Secondary | ICD-10-CM | POA: Diagnosis not present

## 2015-10-03 DIAGNOSIS — N950 Postmenopausal bleeding: Secondary | ICD-10-CM | POA: Diagnosis not present

## 2015-10-03 DIAGNOSIS — K219 Gastro-esophageal reflux disease without esophagitis: Secondary | ICD-10-CM | POA: Diagnosis not present

## 2015-10-03 DIAGNOSIS — I10 Essential (primary) hypertension: Secondary | ICD-10-CM | POA: Diagnosis not present

## 2015-10-03 DIAGNOSIS — J9600 Acute respiratory failure, unspecified whether with hypoxia or hypercapnia: Secondary | ICD-10-CM

## 2015-10-03 DIAGNOSIS — D259 Leiomyoma of uterus, unspecified: Secondary | ICD-10-CM | POA: Diagnosis not present

## 2015-10-03 HISTORY — PX: LAPAROSCOPIC VAGINAL HYSTERECTOMY WITH SALPINGO OOPHORECTOMY: SHX6681

## 2015-10-03 LAB — GLUCOSE, CAPILLARY
GLUCOSE-CAPILLARY: 163 mg/dL — AB (ref 65–99)
Glucose-Capillary: 101 mg/dL — ABNORMAL HIGH (ref 65–99)

## 2015-10-03 SURGERY — HYSTERECTOMY, VAGINAL, LAPAROSCOPY-ASSISTED, WITH SALPINGO-OOPHORECTOMY
Anesthesia: General | Site: Abdomen | Laterality: Bilateral

## 2015-10-03 MED ORDER — MEPERIDINE HCL 25 MG/ML IJ SOLN: 6.2500 mg | INTRAMUSCULAR | Status: DC | PRN

## 2015-10-03 MED ORDER — GLYCOPYRROLATE 0.2 MG/ML IJ SOLN
INTRAMUSCULAR | Status: DC | PRN
  Administered 2015-10-03: 0.1 mg via INTRAVENOUS

## 2015-10-03 MED ORDER — ESTRADIOL 0.1 MG/GM VA CREA
TOPICAL_CREAM | VAGINAL | Status: AC
  Filled 2015-10-03: qty 42.5

## 2015-10-03 MED ORDER — LOSARTAN POTASSIUM 50 MG PO TABS
50.0000 mg | ORAL_TABLET | Freq: Every day | ORAL | Status: DC
  Filled 2015-10-03 (×2): qty 1

## 2015-10-03 MED ORDER — ONDANSETRON HCL 4 MG/2ML IJ SOLN
INTRAMUSCULAR | Status: DC | PRN
  Administered 2015-10-03: 4 mg via INTRAVENOUS

## 2015-10-03 MED ORDER — ROCURONIUM BROMIDE 100 MG/10ML IV SOLN
INTRAVENOUS | Status: DC | PRN
  Administered 2015-10-03: 5 mg via INTRAVENOUS
  Administered 2015-10-03: 20 mg via INTRAVENOUS
  Administered 2015-10-03: 45 mg via INTRAVENOUS
  Administered 2015-10-03 (×3): 10 mg via INTRAVENOUS

## 2015-10-03 MED ORDER — FENTANYL CITRATE (PF) 100 MCG/2ML IJ SOLN
25.0000 ug | INTRAMUSCULAR | Status: DC | PRN
  Administered 2015-10-03: 25 ug via INTRAVENOUS

## 2015-10-03 MED ORDER — MENTHOL 3 MG MT LOZG: 1.0000 | LOZENGE | OROMUCOSAL | Status: DC | PRN

## 2015-10-03 MED ORDER — FENTANYL CITRATE (PF) 100 MCG/2ML IJ SOLN
INTRAMUSCULAR | Status: AC
  Filled 2015-10-03: qty 2

## 2015-10-03 MED ORDER — ACETAMINOPHEN 160 MG/5ML PO SOLN
1000.0000 mg | Freq: Once | ORAL | Status: AC
  Administered 2015-10-03: 1000 mg via ORAL

## 2015-10-03 MED ORDER — MIDAZOLAM HCL 2 MG/2ML IJ SOLN
INTRAMUSCULAR | Status: AC
  Filled 2015-10-03: qty 2

## 2015-10-03 MED ORDER — PHENYLEPHRINE HCL 10 MG/ML IJ SOLN
INTRAMUSCULAR | Status: DC | PRN
  Administered 2015-10-03 (×2): 80 ug via INTRAVENOUS
  Administered 2015-10-03 (×2): 40 ug via INTRAVENOUS

## 2015-10-03 MED ORDER — PANTOPRAZOLE SODIUM 40 MG PO TBEC
40.0000 mg | DELAYED_RELEASE_TABLET | Freq: Every day | ORAL | Status: DC | PRN
  Administered 2015-10-04: 40 mg via ORAL
  Filled 2015-10-03: qty 1

## 2015-10-03 MED ORDER — HYDROMORPHONE HCL 1 MG/ML IJ SOLN
INTRAMUSCULAR | Status: AC
  Filled 2015-10-03: qty 1

## 2015-10-03 MED ORDER — DIPHENHYDRAMINE HCL 50 MG/ML IJ SOLN
INTRAMUSCULAR | Status: AC
  Filled 2015-10-03: qty 1

## 2015-10-03 MED ORDER — PHENYLEPHRINE 40 MCG/ML (10ML) SYRINGE FOR IV PUSH (FOR BLOOD PRESSURE SUPPORT)
PREFILLED_SYRINGE | INTRAVENOUS | Status: AC
  Filled 2015-10-03: qty 10

## 2015-10-03 MED ORDER — FENTANYL CITRATE (PF) 100 MCG/2ML IJ SOLN
50.0000 ug | INTRAMUSCULAR | Status: DC | PRN
  Administered 2015-10-03: 50 ug via INTRAVENOUS
  Filled 2015-10-03 (×2): qty 2

## 2015-10-03 MED ORDER — SUGAMMADEX SODIUM 500 MG/5ML IV SOLN
INTRAVENOUS | Status: AC
  Filled 2015-10-03: qty 5

## 2015-10-03 MED ORDER — FENTANYL CITRATE (PF) 250 MCG/5ML IJ SOLN
INTRAMUSCULAR | Status: AC
  Filled 2015-10-03: qty 5

## 2015-10-03 MED ORDER — EPHEDRINE SULFATE 50 MG/ML IJ SOLN
INTRAMUSCULAR | Status: DC | PRN
  Administered 2015-10-03: 5 mg via INTRAVENOUS
  Administered 2015-10-03: 10 mg via INTRAVENOUS

## 2015-10-03 MED ORDER — SUGAMMADEX SODIUM 200 MG/2ML IV SOLN
INTRAVENOUS | Status: DC | PRN
  Administered 2015-10-03: 226.2 mg via INTRAVENOUS

## 2015-10-03 MED ORDER — HEPARIN SODIUM (PORCINE) 5000 UNIT/ML IJ SOLN
INTRAMUSCULAR | Status: AC
  Filled 2015-10-03: qty 1

## 2015-10-03 MED ORDER — PHENYLEPHRINE HCL 10 MG/ML IJ SOLN
10.0000 mg | INTRAVENOUS | Status: DC | PRN
  Administered 2015-10-03: 20 ug/min via INTRAVENOUS

## 2015-10-03 MED ORDER — ACETAMINOPHEN 500 MG PO TABS
1000.0000 mg | ORAL_TABLET | Freq: Four times a day (QID) | ORAL | Status: DC
  Administered 2015-10-03 – 2015-10-04 (×5): 1000 mg via ORAL
  Filled 2015-10-03 (×5): qty 2

## 2015-10-03 MED ORDER — DIPHENHYDRAMINE HCL 50 MG/ML IJ SOLN
INTRAMUSCULAR | Status: DC | PRN
  Administered 2015-10-03: 12.5 mg via INTRAVENOUS

## 2015-10-03 MED ORDER — VASOPRESSIN 20 UNIT/ML IV SOLN
INTRAVENOUS | Status: AC
  Filled 2015-10-03: qty 1

## 2015-10-03 MED ORDER — DEXAMETHASONE SODIUM PHOSPHATE 4 MG/ML IJ SOLN
INTRAMUSCULAR | Status: DC | PRN
  Administered 2015-10-03 (×2): 5 mg via INTRAVENOUS

## 2015-10-03 MED ORDER — SODIUM CHLORIDE 0.9 % IJ SOLN
INTRAMUSCULAR | Status: AC
  Filled 2015-10-03: qty 50

## 2015-10-03 MED ORDER — BUPIVACAINE-EPINEPHRINE (PF) 0.5% -1:200000 IJ SOLN
INTRAMUSCULAR | Status: AC
  Filled 2015-10-03: qty 30

## 2015-10-03 MED ORDER — NEOSTIGMINE METHYLSULFATE 10 MG/10ML IV SOLN
INTRAVENOUS | Status: AC
  Filled 2015-10-03: qty 1

## 2015-10-03 MED ORDER — METOCLOPRAMIDE HCL 5 MG/ML IJ SOLN
INTRAMUSCULAR | Status: DC | PRN
  Administered 2015-10-03: 10 mg via INTRAVENOUS

## 2015-10-03 MED ORDER — ACETAMINOPHEN 160 MG/5ML PO SOLN
ORAL | Status: AC
  Administered 2015-10-03: 1000 mg via ORAL
  Filled 2015-10-03: qty 40.6

## 2015-10-03 MED ORDER — LACTATED RINGERS IV SOLN
INTRAVENOUS | Status: DC
  Administered 2015-10-03: 23:00:00 via INTRAVENOUS

## 2015-10-03 MED ORDER — KETOROLAC TROMETHAMINE 30 MG/ML IJ SOLN
INTRAMUSCULAR | Status: DC | PRN
  Administered 2015-10-03: 30 mg via INTRAVENOUS

## 2015-10-03 MED ORDER — ROCURONIUM BROMIDE 100 MG/10ML IV SOLN
INTRAVENOUS | Status: AC
  Filled 2015-10-03: qty 1

## 2015-10-03 MED ORDER — FENTANYL CITRATE (PF) 250 MCG/5ML IJ SOLN
INTRAMUSCULAR | Status: DC | PRN
  Administered 2015-10-03 (×2): 50 ug via INTRAVENOUS
  Administered 2015-10-03: 100 ug via INTRAVENOUS
  Administered 2015-10-03: 50 ug via INTRAVENOUS

## 2015-10-03 MED ORDER — PROPOFOL 10 MG/ML IV BOLUS
INTRAVENOUS | Status: DC | PRN
  Administered 2015-10-03: 200 mg via INTRAVENOUS

## 2015-10-03 MED ORDER — METHOCARBAMOL 1000 MG/10ML IJ SOLN
500.0000 mg | Freq: Once | INTRAVENOUS | Status: AC
  Administered 2015-10-03: 500 mg via INTRAVENOUS
  Filled 2015-10-03: qty 5

## 2015-10-03 MED ORDER — METOPROLOL SUCCINATE ER 50 MG PO TB24
50.0000 mg | ORAL_TABLET | Freq: Every day | ORAL | Status: DC
  Filled 2015-10-03 (×2): qty 1

## 2015-10-03 MED ORDER — METOPROLOL SUCCINATE ER 50 MG PO TB24
50.0000 mg | ORAL_TABLET | Freq: Every day | ORAL | Status: DC
  Filled 2015-10-03: qty 1

## 2015-10-03 MED ORDER — LACTATED RINGERS IV SOLN: INTRAVENOUS | Status: DC

## 2015-10-03 MED ORDER — HYDROMORPHONE HCL 1 MG/ML IJ SOLN
INTRAMUSCULAR | Status: DC | PRN
  Administered 2015-10-03: 0.5 mg via INTRAVENOUS

## 2015-10-03 MED ORDER — METFORMIN HCL 500 MG PO TABS
1000.0000 mg | ORAL_TABLET | Freq: Two times a day (BID) | ORAL | Status: DC
  Administered 2015-10-03: 1000 mg via ORAL
  Filled 2015-10-03 (×4): qty 2

## 2015-10-03 MED ORDER — HYDROCHLOROTHIAZIDE 25 MG PO TABS
25.0000 mg | ORAL_TABLET | Freq: Every day | ORAL | Status: DC
  Filled 2015-10-03 (×2): qty 1

## 2015-10-03 MED ORDER — ONDANSETRON HCL 4 MG/2ML IJ SOLN
INTRAMUSCULAR | Status: AC
  Filled 2015-10-03: qty 2

## 2015-10-03 MED ORDER — LIDOCAINE HCL (CARDIAC) 20 MG/ML IV SOLN
INTRAVENOUS | Status: DC | PRN
  Administered 2015-10-03: 100 mg via INTRAVENOUS

## 2015-10-03 MED ORDER — BUPIVACAINE HCL (PF) 0.25 % IJ SOLN
INTRAMUSCULAR | Status: AC
  Filled 2015-10-03: qty 30

## 2015-10-03 MED ORDER — PHENYLEPHRINE 8 MG IN D5W 100 ML (0.08MG/ML) PREMIX OPTIME
INJECTION | INTRAVENOUS | Status: AC
  Filled 2015-10-03: qty 100

## 2015-10-03 MED ORDER — KETOROLAC TROMETHAMINE 30 MG/ML IJ SOLN
INTRAMUSCULAR | Status: AC
  Filled 2015-10-03: qty 2

## 2015-10-03 MED ORDER — SCOPOLAMINE 1 MG/3DAYS TD PT72
1.0000 | MEDICATED_PATCH | Freq: Once | TRANSDERMAL | Status: DC
  Administered 2015-10-03: 1.5 mg via TRANSDERMAL

## 2015-10-03 MED ORDER — LIDOCAINE HCL (CARDIAC) 20 MG/ML IV SOLN
INTRAVENOUS | Status: AC
  Filled 2015-10-03: qty 5

## 2015-10-03 MED ORDER — MIDAZOLAM HCL 2 MG/2ML IJ SOLN
INTRAMUSCULAR | Status: DC | PRN
  Administered 2015-10-03: 2 mg via INTRAVENOUS

## 2015-10-03 MED ORDER — METOCLOPRAMIDE HCL 5 MG/ML IJ SOLN
INTRAMUSCULAR | Status: AC
  Filled 2015-10-03: qty 2

## 2015-10-03 MED ORDER — METOCLOPRAMIDE HCL 5 MG/ML IJ SOLN: 10.0000 mg | Freq: Once | INTRAMUSCULAR | Status: DC | PRN

## 2015-10-03 MED ORDER — KETOROLAC TROMETHAMINE 30 MG/ML IJ SOLN
30.0000 mg | Freq: Four times a day (QID) | INTRAMUSCULAR | Status: AC
  Administered 2015-10-03 (×2): 30 mg via INTRAVENOUS
  Filled 2015-10-03 (×3): qty 1

## 2015-10-03 MED ORDER — PROPOFOL 10 MG/ML IV BOLUS
INTRAVENOUS | Status: AC
  Filled 2015-10-03: qty 20

## 2015-10-03 MED ORDER — ARTIFICIAL TEARS OP OINT
TOPICAL_OINTMENT | OPHTHALMIC | Status: DC | PRN
  Administered 2015-10-03: 1 via OPHTHALMIC

## 2015-10-03 MED ORDER — VASOPRESSIN 20 UNIT/ML IV SOLN
INTRAVENOUS | Status: DC | PRN
  Administered 2015-10-03: 20 mL via INTRAMUSCULAR

## 2015-10-03 MED ORDER — SIMVASTATIN 40 MG PO TABS
40.0000 mg | ORAL_TABLET | Freq: Every evening | ORAL | Status: DC
  Filled 2015-10-03 (×2): qty 1

## 2015-10-03 MED ORDER — DOCUSATE SODIUM 100 MG PO CAPS
100.0000 mg | ORAL_CAPSULE | Freq: Two times a day (BID) | ORAL | Status: DC
  Filled 2015-10-03 (×2): qty 1

## 2015-10-03 MED ORDER — SCOPOLAMINE 1 MG/3DAYS TD PT72
MEDICATED_PATCH | TRANSDERMAL | Status: AC
  Administered 2015-10-03: 1.5 mg via TRANSDERMAL
  Filled 2015-10-03: qty 1

## 2015-10-03 MED ORDER — ONDANSETRON HCL 4 MG PO TABS
4.0000 mg | ORAL_TABLET | Freq: Three times a day (TID) | ORAL | Status: DC | PRN
  Administered 2015-10-03: 4 mg via ORAL
  Filled 2015-10-03: qty 1

## 2015-10-03 MED ORDER — DEXAMETHASONE SODIUM PHOSPHATE 10 MG/ML IJ SOLN
INTRAMUSCULAR | Status: AC
  Filled 2015-10-03: qty 1

## 2015-10-03 MED ORDER — EPHEDRINE 5 MG/ML INJ
INTRAVENOUS | Status: AC
  Filled 2015-10-03: qty 10

## 2015-10-03 MED ORDER — LACTATED RINGERS IV SOLN
INTRAVENOUS | Status: DC
  Administered 2015-10-03 (×3): via INTRAVENOUS

## 2015-10-03 MED ORDER — BUPIVACAINE-EPINEPHRINE 0.5% -1:200000 IJ SOLN
INTRAMUSCULAR | Status: DC | PRN
  Administered 2015-10-03: 4 mL
  Administered 2015-10-03: 6 mL
  Administered 2015-10-03: 21 mL

## 2015-10-03 MED ORDER — TRAMADOL HCL 50 MG PO TABS
50.0000 mg | ORAL_TABLET | ORAL | Status: DC | PRN
  Administered 2015-10-03: 50 mg via ORAL
  Filled 2015-10-03: qty 1

## 2015-10-03 SURGICAL SUPPLY — 58 items
CABLE HIGH FREQUENCY MONO STRZ (ELECTRODE) IMPLANT
CATH ROBINSON RED A/P 16FR (CATHETERS) IMPLANT
CLOTH BEACON ORANGE TIMEOUT ST (SAFETY) ×2 IMPLANT
CONT PATH 16OZ SNAP LID 3702 (MISCELLANEOUS) ×2 IMPLANT
COVER BACK TABLE 60X90IN (DRAPES) ×2 IMPLANT
DECANTER SPIKE VIAL GLASS SM (MISCELLANEOUS) ×3 IMPLANT
DILATOR CANAL MILEX (MISCELLANEOUS) ×1 IMPLANT
DRAPE ROBOTICS STRL (DRAPES) ×1 IMPLANT
DRAPE SHEET LG 3/4 BI-LAMINATE (DRAPES) ×4 IMPLANT
DRSG COVADERM PLUS 2X2 (GAUZE/BANDAGES/DRESSINGS) ×2 IMPLANT
DRSG OPSITE POSTOP 3X4 (GAUZE/BANDAGES/DRESSINGS) ×1 IMPLANT
DURAPREP 26ML APPLICATOR (WOUND CARE) ×2 IMPLANT
ELECT REM PT RETURN 9FT ADLT (ELECTROSURGICAL) ×2
ELECTRODE REM PT RTRN 9FT ADLT (ELECTROSURGICAL) ×1 IMPLANT
EVACUATOR SMOKE 8.L (FILTER) ×1 IMPLANT
FORCEPS CUTTING 33CM 5MM (CUTTING FORCEPS) IMPLANT
FORCEPS CUTTING 45CM 5MM (CUTTING FORCEPS) IMPLANT
GAUZE PACKING 2X5 YD STRL (GAUZE/BANDAGES/DRESSINGS) IMPLANT
GLOVE BIOGEL PI IND STRL 6.5 (GLOVE) ×1 IMPLANT
GLOVE BIOGEL PI IND STRL 7.0 (GLOVE) ×1 IMPLANT
GLOVE BIOGEL PI INDICATOR 6.5 (GLOVE) ×1
GLOVE BIOGEL PI INDICATOR 7.0 (GLOVE) ×1
GLOVE SURG SS PI 6.5 STRL IVOR (GLOVE) ×7 IMPLANT
LEGGING LITHOTOMY PAIR STRL (DRAPES) ×2 IMPLANT
LIQUID BAND (GAUZE/BANDAGES/DRESSINGS) IMPLANT
MANIPULATOR UTERINE 4.5 ZUMI (MISCELLANEOUS) ×1 IMPLANT
NDL MAYO CATGUT SZ4 TPR NDL (NEEDLE) ×1 IMPLANT
NEEDLE MAYO CATGUT SZ4 (NEEDLE) ×2 IMPLANT
NS IRRIG 1000ML POUR BTL (IV SOLUTION) ×2 IMPLANT
PACK LAVH (CUSTOM PROCEDURE TRAY) ×2 IMPLANT
PACK ROBOTIC GOWN (GOWN DISPOSABLE) ×2 IMPLANT
PAD MAGNETIC INST (MISCELLANEOUS) ×3 IMPLANT
PAD TRENDELENBURG POSITION (MISCELLANEOUS) ×2 IMPLANT
SET IRRIG TUBING LAPAROSCOPIC (IRRIGATION / IRRIGATOR) IMPLANT
SHEARS HARMONIC ACE PLUS 36CM (ENDOMECHANICALS) ×1 IMPLANT
SLEEVE XCEL OPT CAN 5 100 (ENDOMECHANICALS) ×2 IMPLANT
SOLUTION ELECTROLUBE (MISCELLANEOUS) IMPLANT
STRIP CLOSURE SKIN 1/4X3 (GAUZE/BANDAGES/DRESSINGS) IMPLANT
SUT CHROMIC 2 0 TIES 18 (SUTURE) IMPLANT
SUT VIC AB 0 CT1 18XCR BRD8 (SUTURE) ×1 IMPLANT
SUT VIC AB 0 CT1 27 (SUTURE)
SUT VIC AB 0 CT1 27XCR 8 STRN (SUTURE) IMPLANT
SUT VIC AB 0 CT1 8-18 (SUTURE) ×6
SUT VIC AB 3-0 PS2 18 (SUTURE) ×2
SUT VIC AB 3-0 PS2 18XBRD (SUTURE) ×1 IMPLANT
SUT VICRYL 0 ENDOLOOP (SUTURE) IMPLANT
SUT VICRYL 0 TIES 12 18 (SUTURE) ×2 IMPLANT
SUT VICRYL 0 UR6 27IN ABS (SUTURE) ×4 IMPLANT
SYR 20CC LL (SYRINGE) ×2 IMPLANT
SYR 50ML LL SCALE MARK (SYRINGE) ×2 IMPLANT
SYR TB 1ML LUER SLIP (SYRINGE) ×2 IMPLANT
TOWEL OR 17X24 6PK STRL BLUE (TOWEL DISPOSABLE) ×4 IMPLANT
TRAY FOLEY CATH SILVER 14FR (SET/KITS/TRAYS/PACK) ×2 IMPLANT
TROCAR BALL TOP DISP 5MM (ENDOMECHANICALS) ×1 IMPLANT
TROCAR OPTI TIP 5M 100M (ENDOMECHANICALS) ×1 IMPLANT
TROCAR XCEL DIL TIP R 11M (ENDOMECHANICALS) ×2 IMPLANT
WARMER LAPAROSCOPE (MISCELLANEOUS) ×2 IMPLANT
WATER STERILE IRR 1000ML POUR (IV SOLUTION) ×1 IMPLANT

## 2015-10-03 NOTE — Op Note (Addendum)
Laparoscopically assisted vaginal hysterectomy and bilateral salpingectomy  Indications: The patient is a 60 y.o. female with *postmenopausal bleeding and intermittent episodes of pelvic pain**.  Pre-operative Diagnosis: Postmenopausal bleeding. Small uterine fibroids. History of low-grade squamous intraepithelial lesion  Post-operative Diagnosis: Same  Surgeon: Eldred Manges   Assistants: Earnstine Regal certified physician assistant  Anesthesia: General endotracheal anesthesia  ASA Class: 3  Procedure Details  The patient was seen in the Holding Room. The risks, benefits, complications, treatment options, and expected outcomes were discussed with the patient. The possibilities of reaction to medication, pulmonary aspiration, perforation of viscus, bleeding, recurrent infection, the need for additional procedures, failure to diagnose a condition, and creating a complication requiring transfusion or operation were discussed with the patient. The patient concurred with the proposed plan, giving informed consent. The patient was taken to the Operating Room, identified as Alexandra Walker and the procedure verified as Diagnostic Laparoscopy. A Time Out was held and the above information confirmed.  After induction of general anesthesia, the patient was placed in modified dorsal lithotomy position where she was prepped, draped, and catheterized in the normal, sterile fashion.  The cervix was visualized and an intrauterine manipulator was placed. Suprapubic and subumbilical injections of [id number] Marcaine with epinephrine was undertaken were total of 20 cc. A 2 cm umbilical incision was then performed, and a combination of blunt and sharp dissection used to enter the fascia which was incised and marked with sutures of 0 Vicryl and then the peritoneal cavity. A Hassan cannula was placed into the peritoneal cavity under direct visualization and the holding sutures used to tie it in place. The laparoscope  was placed through the [facility] cannula sleeve. Suprapubic incisions to the right and left of midline were made, and laparoscopic probe trochars placed through those incisions into the peritoneal cavity under direct visualization. The  findings below were noted.  The right round ligament was grasped and incised with the Harmonic Ace. The right fallopian tube and utero-ovarian ligaments were likewise grasped and cauterized and incised with the Harmonic Ace. A similar procedure was carried out on the opposite side. The left fallopian tube was then cauterized and excised from its ovarian attachment and brought through the suprapubic port on the right side. A similar procedure was carried out with the fallopian tube on the opposite side. The left fallopian tube was marked with a suture and both were sent as pathology specimens. Hemostasis was noted to be adequate. The surgeon and assistant then moved to the perineum as the site of operation. A weighted speculum was placed in the posterior vagina and the cervix grasped with a Lahey tenaculum. The cervicovaginal mucosa was incised with a dilute solution of Pitressin and incised. The anterior vaginal mucosa was bluntly dissected off the anterior cervix and the anterior peritoneum entered sharply with a bladder blade placed. The posterior peritoneum was then entered and a uterosacral ligaments clamped cut and suture-ligated with those sutures held. The paracervical tissues were then successively clamped, cut and suture ligated on the right and left sides. The parametrial tissues were clamped cut and suture ligated. The remaining upper portion of the parametrial tissues were clamped cut suture-ligated and the uterus and cervix were removed from the operative field. A McCall culdoplasty suture was placed incorporating the uterosacral ligaments and posterior peritoneum. The vaginal angles were created with the sutures that had been held from the uterosacral ligaments tying  anteriorly and posteriorly. The remainder of the vaginal cuff was closed with figure-of-eight sutures. All  sutures were 0 Vicryl. Hemostasis was noted to be adequate. Once the [facility] culdoplasty suture was tied down. All instruments were then removed from the vagina and the surgeon changed gown and gloves to return to the laparoscopy site. Pneumoperitoneum was re-created. Action of the peritoneal cavity revealed excellent hemostasis. 60 cc of warm saline was left in the peritoneal cavity and all instruments were removed from the peritoneal cavity under direct visualization. The subumbilical incision was closed with figure-of-eight sutures of 0 Vicryl which had been placed for holding the Hassan cannula in place. It was closed with a subcuticular suture of 3-0 Monocryl. The suprapubic incisions were closed with Dermabond and Dermabond was placed over these subumbilical incision. The patient was then awakened from general anesthesia and taken to the recovery room in satisfactory condition having tolerated procedure well with sponge and instrument counts correct.  Following the procedure the umbilical sheath was removed after intra-abdominal carbon dioxide was expressed. The incision was closed with subcutaneous and subcuticular sutures of 4-0 Vicryl. The intrauterine manipulator was then removed.  Instrument, sponge, and needle counts were correct prior to abdominal closure and at the conclusion of the case.   Findings: The anterior cul-de-sac and round ligaments no significant adhesions or evidence of endometriosis The uterus normal size with 2 less than 2 cm fibroids subserosally. The adnexa . The ovaries and tubes were normal bilaterally. Cul-de-sac no significant adhesions or evidence of endometriosis.  Estimated Blood Loss:  125 cc         Drains: None         Specimens: Uterus, cervix, bilateral fallopian tubes.              Complications:  None; patient tolerated the procedure well.          Disposition: PACU - hemodynamically stable.         Condition: stable

## 2015-10-03 NOTE — Anesthesia Procedure Notes (Signed)
Procedure Name: Intubation Date/Time: 10/03/2015 7:49 AM Performed by: Flossie Dibble Pre-anesthesia Checklist: Emergency Drugs available, Suction available, Patient being monitored, Patient identified and Timeout performed Patient Re-evaluated:Patient Re-evaluated prior to inductionOxygen Delivery Method: Circle system utilized Preoxygenation: Pre-oxygenation with 100% oxygen Intubation Type: IV induction Ventilation: Mask ventilation without difficulty and Oral airway inserted - appropriate to patient size Laryngoscope Size: Mac and 3 Grade View: Grade I Tube type: Oral Tube size: 7.0 mm Number of attempts: 1 Airway Equipment and Method: Patient positioned with wedge pillow and Stylet (Wedge made of 4 folded bath blankets and a foam head rest.) Placement Confirmation: ETT inserted through vocal cords under direct vision,  breath sounds checked- equal and bilateral and positive ETCO2 Secured at: 22 cm Tube secured with: Tape Dental Injury: Teeth and Oropharynx as per pre-operative assessment

## 2015-10-03 NOTE — Anesthesia Postprocedure Evaluation (Signed)
Anesthesia Post Note  Patient: Alexandra Walker  Procedure(s) Performed: Procedure(s) (LRB): LAPAROSCOPIC ASSISTED VAGINAL HYSTERECTOMY WITH SALPINGECTOMY (Bilateral)  Patient location during evaluation: PACU Anesthesia Type: General Level of consciousness: awake and alert Pain management: pain level controlled Vital Signs Assessment: post-procedure vital signs reviewed and stable Respiratory status: spontaneous breathing, nonlabored ventilation, respiratory function stable and patient connected to nasal cannula oxygen Cardiovascular status: blood pressure returned to baseline and stable Postop Assessment: no signs of nausea or vomiting Anesthetic complications: no     Last Vitals:  Filed Vitals:   10/03/15 1300 10/03/15 1315  BP: 143/72 144/80  Pulse: 64 60  Temp: 36.3 C   Resp: 14 14    Last Pain:  Filed Vitals:   10/03/15 1316  PainSc: 3    Pain Goal: Patients Stated Pain Goal: 3 (10/03/15 1300)               Montez Hageman

## 2015-10-03 NOTE — Anesthesia Preprocedure Evaluation (Addendum)
Anesthesia Evaluation  Patient identified by MRN, date of birth, ID band Patient awake    Reviewed: Allergy & Precautions, NPO status , Patient's Chart, lab work & pertinent test results  History of Anesthesia Complications (+) PONV  Airway Mallampati: I  TM Distance: >3 FB Neck ROM: Full    Dental  (+) Teeth Intact, Dental Advisory Given   Pulmonary    breath sounds clear to auscultation       Cardiovascular hypertension, Pt. on medications and Pt. on home beta blockers  Rhythm:Regular Rate:Normal     Neuro/Psych    GI/Hepatic GERD  ,  Endo/Other  diabetes, Well Controlled, Oral Hypoglycemic AgentsMorbid obesity  Renal/GU      Musculoskeletal   Abdominal   Peds  Hematology   Anesthesia Other Findings   Reproductive/Obstetrics                            Anesthesia Physical  Anesthesia Plan  ASA: III  Anesthesia Plan: General   Post-op Pain Management:    Induction: Intravenous  Airway Management Planned: Oral ETT  Additional Equipment:   Intra-op Plan:   Post-operative Plan: Extubation in OR  Informed Consent: I have reviewed the patients History and Physical, chart, labs and discussed the procedure including the risks, benefits and alternatives for the proposed anesthesia with the patient or authorized representative who has indicated his/her understanding and acceptance.   Dental advisory given  Plan Discussed with: CRNA, Anesthesiologist and Surgeon  Anesthesia Plan Comments: (Previous DL was Grade 3 view with Sabra Heck.  Myalgias from sux. Avoid if possible.)       Anesthesia Quick Evaluation

## 2015-10-03 NOTE — Progress Notes (Signed)
1 Day Post-Op Procedure(s) (LRB): LAPAROSCOPIC ASSISTED VAGINAL HYSTERECTOMY WITH SALPINGECTOMY (Bilateral)  Subjective: Pt requested that her RN call me because her pulse oximetry alarm sounded with a pulse of 48.  I spoke with the patient by phone after receiving report from the nurse of no accompanying sx. Patient reports no chest pain or shortness of breath.  No dizziness with ambulation into the hall.  No nausea or vomiting. She does admit to feeling some anxiety.  Objective: Temp:  [97.4 F (36.3 C)-98.8 F (37.1 C)] 98.1 F (36.7 C) (05/16 2205) Pulse Rate:  [50-77] 50 (05/16 2205) Resp:  [13-23] 16 (05/16 2205) BP: (121-160)/(48-97) 121/48 mmHg (05/16 2205) SpO2:  [87 %-98 %] 97 % (05/16 2205)  Assessment: s/p Procedure(s): LAPAROSCOPIC ASSISTED VAGINAL HYSTERECTOMY WITH SALPINGECTOMY (Bilateral):  Transient decrease in heart rate post operatively without any accompanying sx most consistent with effect from metroprolol extended release taken this am preoperatively. Plan: Pt discussed with oncoming nurse who will assess the pt's vs again within the hour or with any of the above sx Labs as ordered in am. I have written parameters for which beta block and antihypertensives should be held in this postoperative period.       Momodou Consiglio P 10/04/2015, 12:01 AM

## 2015-10-03 NOTE — Progress Notes (Signed)
Day of Surgery Procedure(s) (LRB): LAPAROSCOPIC ASSISTED VAGINAL HYSTERECTOMY WITH SALPINGECTOMY (Bilateral)  Subjective: Patient reports no  nausea, vomiting, incisional pain.  Is tolerating PO and no flatus yet. Objective: I have reviewed patient's vital signs, intake and output and medications.  General: alert and cooperative Resp: clear to auscultation bilaterally Cardio: regular rate and rhythm, S1, S2 normal, no murmur, click, rub or gallop GI: soft, non-tender; bowel sounds normal; no masses,  no organomegaly Extremities: extremities normal, atraumatic, no cyanosis or edema Vaginal Bleeding: minimal  Assessment: s/p Procedure(s): LAPAROSCOPIC ASSISTED VAGINAL HYSTERECTOMY WITH SALPINGECTOMY (Bilateral): stable  Plan: Advance diet Encourage ambulation Advance to PO medication ( has taken some already) In am: Discontinue IV fluids Continue foley due to overrnight.  D/C in am Discharge home     [place] 10/03/2015, 7:16 PM

## 2015-10-03 NOTE — H&P (Signed)
History and Physical Interval Note:   10/03/2015   7:19 AM   Alexandra Walker  has presented today for surgery, with the diagnosis of Post-menopausal Bleeding - 3 hours  The various methods of treatment have been discussed with the patient and family. After consideration of risks, benefits and other options for treatment, the patient has consented to  Procedure(s): LAPAROSCOPIC ASSISTED VAGINAL HYSTERECTOMY WITH SALPINGECTOMY with the possibliity of bilateral oophorectomu as a surgical intervention .  I have reviewed the patients' chart and labs.  Questions were answered to the patient's satisfaction.     Eldred Manges  MD

## 2015-10-03 NOTE — Transfer of Care (Signed)
Immediate Anesthesia Transfer of Care Note  Patient: Alexandra Walker  Procedure(s) Performed: Procedure(s): LAPAROSCOPIC ASSISTED VAGINAL HYSTERECTOMY WITH SALPINGECTOMY (Bilateral)  Patient Location: PACU  Anesthesia Type:General  Level of Consciousness: awake, alert  and oriented  Airway & Oxygen Therapy: Patient Spontanous Breathing and Patient connected to nasal cannula oxygen  Post-op Assessment: Report given to RN and Post -op Vital signs reviewed and stable  Post vital signs: Reviewed and stable  Last Vitals:  Filed Vitals:   10/03/15 0634  BP: 160/97  Pulse: 64  Temp: 37.1 C  Resp: 16    Last Pain: There were no vitals filed for this visit.    Patients Stated Pain Goal: 3 ([id number])  Complications: No apparent anesthesia complications

## 2015-10-04 ENCOUNTER — Encounter (HOSPITAL_COMMUNITY): Payer: Self-pay | Admitting: Obstetrics and Gynecology

## 2015-10-04 ENCOUNTER — Encounter (HOSPITAL_COMMUNITY): Payer: Self-pay | Admitting: Certified Registered Nurse Anesthetist

## 2015-10-04 ENCOUNTER — Observation Stay (HOSPITAL_COMMUNITY): Payer: 59

## 2015-10-04 ENCOUNTER — Observation Stay (HOSPITAL_BASED_OUTPATIENT_CLINIC_OR_DEPARTMENT_OTHER): Payer: 59

## 2015-10-04 DIAGNOSIS — R0789 Other chest pain: Secondary | ICD-10-CM

## 2015-10-04 DIAGNOSIS — R079 Chest pain, unspecified: Secondary | ICD-10-CM

## 2015-10-04 DIAGNOSIS — I10 Essential (primary) hypertension: Secondary | ICD-10-CM

## 2015-10-04 DIAGNOSIS — D252 Subserosal leiomyoma of uterus: Secondary | ICD-10-CM | POA: Diagnosis not present

## 2015-10-04 DIAGNOSIS — R9431 Abnormal electrocardiogram [ECG] [EKG]: Secondary | ICD-10-CM

## 2015-10-04 DIAGNOSIS — R072 Precordial pain: Secondary | ICD-10-CM

## 2015-10-04 DIAGNOSIS — E119 Type 2 diabetes mellitus without complications: Secondary | ICD-10-CM | POA: Diagnosis not present

## 2015-10-04 DIAGNOSIS — R102 Pelvic and perineal pain: Secondary | ICD-10-CM | POA: Diagnosis not present

## 2015-10-04 DIAGNOSIS — F329 Major depressive disorder, single episode, unspecified: Secondary | ICD-10-CM | POA: Diagnosis not present

## 2015-10-04 DIAGNOSIS — R0902 Hypoxemia: Secondary | ICD-10-CM | POA: Diagnosis not present

## 2015-10-04 DIAGNOSIS — K219 Gastro-esophageal reflux disease without esophagitis: Secondary | ICD-10-CM | POA: Diagnosis not present

## 2015-10-04 DIAGNOSIS — J9811 Atelectasis: Secondary | ICD-10-CM | POA: Diagnosis not present

## 2015-10-04 DIAGNOSIS — N950 Postmenopausal bleeding: Secondary | ICD-10-CM | POA: Diagnosis not present

## 2015-10-04 DIAGNOSIS — R05 Cough: Secondary | ICD-10-CM | POA: Diagnosis not present

## 2015-10-04 DIAGNOSIS — E785 Hyperlipidemia, unspecified: Secondary | ICD-10-CM

## 2015-10-04 DIAGNOSIS — K589 Irritable bowel syndrome without diarrhea: Secondary | ICD-10-CM | POA: Diagnosis not present

## 2015-10-04 DIAGNOSIS — Z885 Allergy status to narcotic agent status: Secondary | ICD-10-CM | POA: Diagnosis not present

## 2015-10-04 LAB — CBC
HCT: 39 % (ref 36.0–46.0)
HEMOGLOBIN: 12.7 g/dL (ref 12.0–15.0)
MCH: 27.7 pg (ref 26.0–34.0)
MCHC: 32.6 g/dL (ref 30.0–36.0)
MCV: 85.2 fL (ref 78.0–100.0)
Platelets: 241 10*3/uL (ref 150–400)
RBC: 4.58 MIL/uL (ref 3.87–5.11)
RDW: 13.3 % (ref 11.5–15.5)
WBC: 10.5 10*3/uL (ref 4.0–10.5)

## 2015-10-04 LAB — BASIC METABOLIC PANEL
ANION GAP: 8 (ref 5–15)
BUN: 10 mg/dL (ref 6–20)
CO2: 27 mmol/L (ref 22–32)
Calcium: 8.8 mg/dL — ABNORMAL LOW (ref 8.9–10.3)
Chloride: 101 mmol/L (ref 101–111)
Creatinine, Ser: 0.71 mg/dL (ref 0.44–1.00)
GFR calc Af Amer: >60 mL/min (ref >60)
GLUCOSE: 128 mg/dL — AB (ref 65–99)
POTASSIUM: 4.3 mmol/L (ref 3.5–5.1)
Sodium: 136 mmol/L (ref 135–145)

## 2015-10-04 LAB — LACTIC ACID, PLASMA: Lactic Acid, Venous: 1.6 mmol/L (ref 0.5–2.0)

## 2015-10-04 LAB — PHOSPHORUS: Phosphorus: 4 mg/dL (ref 2.5–4.6)

## 2015-10-04 LAB — CK TOTAL AND CKMB (NOT AT ARMC)
CK, MB: 1.7 ng/mL (ref 0.5–5.0)
RELATIVE INDEX: 1.3 (ref 0.0–2.5)
Total CK: 135 U/L (ref 38–234)

## 2015-10-04 LAB — TROPONIN I
Troponin I: <0.03 ng/mL (ref <0.031)
Troponin I: <0.03 ng/mL (ref <0.031)

## 2015-10-04 LAB — GLUCOSE, CAPILLARY: Glucose-Capillary: 121 mg/dL — ABNORMAL HIGH (ref 65–99)

## 2015-10-04 LAB — MAGNESIUM: MAGNESIUM: 2 mg/dL (ref 1.7–2.4)

## 2015-10-04 LAB — ECHOCARDIOGRAM COMPLETE

## 2015-10-04 LAB — D-DIMER, QUANTITATIVE (NOT AT ARMC): D DIMER QUANT: 0.42 ug{FEU}/mL (ref 0.00–0.50)

## 2015-10-04 MED ORDER — TRAMADOL HCL 50 MG PO TABS: 50.0000 mg | ORAL_TABLET | ORAL | Status: DC | PRN

## 2015-10-04 MED ORDER — GI COCKTAIL ~~LOC~~
30.0000 mL | Freq: Once | ORAL | Status: AC
  Administered 2015-10-04: 30 mL via ORAL
  Filled 2015-10-04: qty 30

## 2015-10-04 MED ORDER — ASPIRIN 81 MG PO CHEW
81.0000 mg | CHEWABLE_TABLET | Freq: Once | ORAL | Status: AC
  Administered 2015-10-04: 81 mg via ORAL
  Filled 2015-10-04: qty 1

## 2015-10-04 MED ORDER — ONDANSETRON HCL 4 MG PO TABS: 4.0000 mg | ORAL_TABLET | Freq: Three times a day (TID) | ORAL | Status: DC | PRN

## 2015-10-04 NOTE — Progress Notes (Signed)
Pt transferred from [facility] Unit R#[id number] to AICU R#[id number] @0630   For Tele. as per MDs' order

## 2015-10-04 NOTE — Progress Notes (Signed)
Echocardiogram Complete Alexandra Walker RDCS, 10/04/15 10:57

## 2015-10-04 NOTE — Discharge Instructions (Signed)
Call [facility] @ [phone] if:  You have a temperature greater than or equal to 100.4 degrees Farenheit orally You have pain that is not made better by the pain medication given and taken as directed You have excessive bleeding or problems urinating  Take Colace (Docusate Sodium/Stool Softener) 100 mg 2-3 times daily while taking narcotic pain medicine to avoid constipation or until bowel movements are regular. Ibuprofen 600 mg  with food every 6 hours for 5 days then as needed for pain  You may drive after 2 weeks You may walk up steps  You may shower  You may resume a regular diet  Keep incisions clean and dry Do not lift over 15 pounds for 6 weeks Avoid anything in vagina for 6 weeks (or until after your post-operative visit)

## 2015-10-04 NOTE — Anesthesia Postprocedure Evaluation (Signed)
Anesthesia Post Note  Patient: Alexandra Walker  Procedure(s) Performed: Procedure(s) (LRB): LAPAROSCOPIC ASSISTED VAGINAL HYSTERECTOMY WITH SALPINGECTOMY (Bilateral)  Patient location during evaluation: A-ICU Anesthesia Type: General Level of consciousness: awake and alert Pain management: satisfactory to patient Vital Signs Assessment: post-procedure vital signs reviewed and stable Respiratory status: spontaneous breathing and respiratory function stable Cardiovascular status: stable Postop Assessment: adequate PO intake Anesthetic complications: no Comments: The patient was transferred to AICU last evening after complaining of chest pain. She has been evaluated by cardiology and pulmonology and all tests have come back negative thus far including, ECG, cardiac echo, chest radiograph ( atelectasis clearing with incentive spirometry and walking), troponin and d-dimer levels. The patient states that she is feeling well today and her saturation is 100 % after coughing and deep breathing on room air.      Last Vitals:  Filed Vitals:   10/04/15 1000 10/04/15 1100  BP:    Pulse: 48 61  Temp:    Resp:  23    Last Pain:  Filed Vitals:   10/04/15 1153  PainSc: 3    Pain Goal: Patients Stated Pain Goal: 3 (10/04/15 0800)               [name]

## 2015-10-04 NOTE — Progress Notes (Addendum)
[facility] Progress Note Patient Name: NECHA HARRIES DOB: 04/18/1956 MRN: [id number]   Date of Service  10/04/2015  HPI/Events of Note  RN calling elink to have eMD take a look at ekg and interpret it  On subsequtn questioning patient is 23 with DM and BP. S/p TAH .   O 5/16/1 at 3pm - new 2L hypxemia. And 5:50 AM having chest pain  EKG -non specific  eICU Interventions  D dimer cxr stat Trop q8h x 3 Lactic acid stat Cbc, bmet, ck, mag, phos stat Baby asprin Move to tele  Might need CTA depending on above  Cam care done 6:49 AM when she moved to ICU     Intervention Category Major Interventions: Other:  Tayari Yankee 10/04/2015, 5:49 AM   Patient Active Problem List   Diagnosis Date Noted  . 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 Post-menopausal bleeding 10/03/2015  . LGSIL (low grade squamous intraepithelial dysplasia) 08/05/2015  . PMB (postmenopausal bleeding) 08/05/2015  . Low back pain 07/27/2014  . Whiplash 06/04/2012  . Chest pain 09/19/2011  . PAC (premature atrial contraction) 09/19/2011  . 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 BACK PAIN WITH RADICULOPATHY 12/14/2009  . OBESITY 08/22/2009  . DIARRHEA 08/22/2009  . FULL INCONTINENCE OF FECES 08/22/2009     ICD-9-CM ICD-10-CM   1. Chest pain 786.50 R07.9 DG Chest [facility] 1 View     DG Chest [place] 1 View     CANCELED: DG Chest [place] 1 View     CANCELED: DG Chest [place] 1 View     has a past medical history of Hypertension; Hyperlipidemia; IBS (irritable bowel syndrome); Glucose intolerance (impaired glucose tolerance); PAC (premature atrial contraction); Seasonal allergies; HLA B27 (HLA B27 positive); PONV (postoperative nausea and vomiting); Paronychia; Trigger ring finger of left hand; Diabetes ([place]); Ischemic colitis ([place]); Lymphadenopathy; Chronic depressive disorder; Circadian rhythm sleep disorder; Ganglion of knee; Kyphosis; Morbid obesity ([place]);  Onycholysis; PMS (premenstrual syndrome); Vitamin D deficiency; Dysrhythmia; Chronic edema; GERD (gastroesophageal reflux disease); Migraine headache; Arthritis; Sacroiliitis ([place]); Lateral epicondylitis of right elbow; and 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   reports that she has never smoked. She has never used smokeless tobacco.  Past Surgical History  Procedure Laterality Date  . Abdominal laparascopic  92  . Left finger surgery    . Colonoscopy w/ biopsies  multiple  . Esophagogastroduodenoscopy  multiple  . Knee arthroscopy  07,10    left  . Dilation and curettage of uterus  07,11  . Breast surgery  2003    bx lt-neg  . Cervical cone biopsy    . Mass excision Left 12/24/2012    Procedure: LEFT EXCISION OF PALMAR MASS X TWO;  Surgeon: Roseanne Kaufman, MD;  Location: [place];  Service: Orthopedics;  Laterality: Left;  . Diagnostic laparoscopy    . Cholecystectomy N/A 10/26/2014    Procedure: LAPAROSCOPIC CHOLECYSTECTOMY;  Surgeon: Coralie Keens, MD;  Location: [place];  Service: General;  Laterality: N/A;  . Bladder surgery      Allergies  Allergen Reactions  . Ace Inhibitors Other (See Comments)    coughing  . Percocet [Oxycodone-Acetaminophen] Nausea And Vomiting  . Vicodin [Hydrocodone-Acetaminophen] Nausea And Vomiting  . Victoza [Liraglutide] Other (See Comments)    pancreatitis    Immunization History  Administered Date(s) Administered  . Rabies, IM 10/14/2011, 10/17/2011, 10/23/2011, 10/31/2011  . Rabies, intradermal 10/14/2011    Family History  Problem Relation Age of Onset  .  Heart attack Father   . Heart disease Father   . Coronary artery disease Mother   . Hypertension Mother   . Heart disease Mother   . Hypertension Sister   . Hypertension Brother   . Colon cancer Neg Hx   . Heart attack Mother      Current facility-administered medications:  .  acetaminophen (TYLENOL) tablet 1,000 mg, 1,000 mg, Oral, Q6H, Elmira  Powell, PA-C, 1,000 mg at 10/04/15 0300 .  aspirin chewable tablet 81 mg, 81 mg, Oral, Once, Brand Males, MD .  docusate sodium (COLACE) capsule 100 mg, 100 mg, Oral, BID, Elmira Powell, PA-C, 100 mg at 10/03/15 1500 .  fentaNYL (SUBLIMAZE) injection 50 mcg, 50 mcg, Intravenous, Q2H PRN, Eldred Manges, MD, 50 mcg at 10/03/15 1454 .  hydrochlorothiazide (HYDRODIURIL) tablet 25 mg, 25 mg, Oral, Daily, Elmira Powell, PA-C .  ketorolac (TORADOL) 30 MG/ML injection 30 mg, 30 mg, Intravenous, Q6H, Elmira Powell, PA-C, 30 mg at 10/03/15 2251 .  lactated ringers infusion, , Intravenous, Continuous, Elmira Florene Glen, PA-C, Last Rate: 125 mL/hr at 10/03/15 2253 .  losartan (COZAAR) tablet 50 mg, 50 mg, Oral, Daily, Eldred Manges, MD .  menthol-cetylpyridinium (CEPACOL) lozenge 3 mg, 1 lozenge, Oral, Q2H PRN, Earnstine Regal, PA-C .  metFORMIN (GLUCOPHAGE) tablet 1,000 mg, 1,000 mg, Oral, BID WC, Elmira Powell, PA-C, 1,000 mg at 10/03/15 1721 .  metoprolol succinate (TOPROL-XL) 24 hr tablet 50 mg, 50 mg, Oral, Daily, Eldred Manges, MD .  ondansetron (ZOFRAN) tablet 4 mg, 4 mg, Oral, Q8H PRN, Earnstine Regal, PA-C, 4 mg at 10/03/15 1808 .  pantoprazole (PROTONIX) EC tablet 40 mg, 40 mg, Oral, Daily PRN, Earnstine Regal, PA-C, 40 mg at 10/04/15 0513 .  simvastatin (ZOCOR) tablet 40 mg, 40 mg, Oral, QPM, Elmira Powell, PA-C, 40 mg at 10/03/15 2151 .  traMADol (ULTRAM) tablet 50 mg, 50 mg, Oral, Q4H PRN, Eldred Manges, MD, 50 mg at 10/03/15 1808   PULMONARY No results for input(s): PHART, PCO2ART, PO2ART, HCO3, TCO2, O2SAT in the last 168 hours.  Invalid input(s): PCO2, PO2  CBC  Recent Labs Lab 10/04/15 0517  HGB 12.7  HCT 39.0  WBC 10.5  PLT 241    COAGULATION No results for input(s): INR in the last 168 hours.  CARDIAC  No results for input(s): TROPONINI in the last 168 hours. No results for input(s): PROBNP in the last 168 hours.   CHEMISTRY No results for input(s): NA, K,  CL, CO2, GLUCOSE, BUN, CREATININE, CALCIUM, MG, PHOS in the last 168 hours. Estimated Creatinine Clearance: 100.7 mL/min (by C-G formula based on Cr of 0.77).   LIVER No results for input(s): AST, ALT, ALKPHOS, BILITOT, PROT, ALBUMIN, INR in the last 168 hours.   INFECTIOUS No results for input(s): LATICACIDVEN, PROCALCITON in the last 168 hours.   ENDOCRINE CBG (last 3)   Recent Labs  10/03/15 0639 10/03/15 1228  GLUCAP 101* 163*         IMAGING x48h  - image(s) personally visualized  -   highlighted in bold No results found.

## 2015-10-04 NOTE — Consult Note (Signed)
PULMONARY / CRITICAL CARE MEDICINE   Name: Alexandra Walker MRN: [id number] DOB: 02/12/1956    ADMISSION DATE:  10/03/2015 CONSULTATION DATE:  10/04/15   REFERRING MD:  Hagood  CHIEF COMPLAINT:  Chest pain  HISTORY OF PRESENT ILLNESS:   60-year-old female with a past medical history significant for diabetes and hypertension who had hysterectomy on 10/03/2015 developed chest pain within the first 24 hours postoperatively.The operation went without complication. She noted in the evening last night that she was having bradycardia, apparently some of this started in the postoperative unit. In the middle the night she did not have shortness of breath but she was noted to have an O2 saturation "in the mid 80s" on room air. She was treated with 2 L nasal cannula. As the evening went on she developed a substernal chest pressure/burning. This does not radiate anywhere. She tried taking a GI cocktail which did not relieve the symptoms. She was administered an aspirin. The symptoms of now subsided. During this time she's had no problems with shortness of breath. She has noted a cough and some mild chest congestion which he attributes to the endotracheal tube from her procedure yesterday. Pulmonary and critical care medicine was consulted this morning to evaluate chest pain. Initially a CT angiogram was performed. The patient denies any leg pain or swelling. She now feels well. Her oxygen saturation has improved.  PAST MEDICAL HISTORY :  She  has a past medical history of Hypertension; Hyperlipidemia; IBS (irritable bowel syndrome); Glucose intolerance (impaired glucose tolerance); PAC (premature atrial contraction); Seasonal allergies; HLA B27 (HLA B27 positive); PONV (postoperative nausea and vomiting); Paronychia; Trigger ring finger of left hand; Diabetes (HCC); Ischemic colitis (HCC); Lymphadenopathy; Chronic depressive disorder; Circadian rhythm sleep disorder; Ganglion of knee; Kyphosis; Morbid obesity  (HCC); Onycholysis; PMS (premenstrual syndrome); Vitamin D deficiency; Dysrhythmia; Chronic edema; GERD (gastroesophageal reflux disease); Migraine headache; Arthritis; Sacroiliitis (HCC); Lateral epicondylitis of right elbow; and 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HCC).  PAST SURGICAL HISTORY: She  has past surgical history that includes abdominal laparascopic (92); left finger surgery; Colonoscopy w/ biopsies (multiple); Esophagogastroduodenoscopy (multiple); Knee arthroscopy (07,10); Dilation and curettage of uterus (07,11); Breast surgery (2003); Cervical cone biopsy; Mass excision (Left, 12/24/2012); Diagnostic laparoscopy; Cholecystectomy (N/A, 10/26/2014); and Bladder surgery.  Allergies  Allergen Reactions  . Ace Inhibitors Other (See Comments)    coughing  . Percocet [Oxycodone-Acetaminophen] Nausea And Vomiting  . Vicodin [Hydrocodone-Acetaminophen] Nausea And Vomiting  . Victoza [Liraglutide] Other (See Comments)    pancreatitis    No current facility-administered medications on file prior to encounter.   Current Outpatient Prescriptions on File Prior to Encounter  Medication Sig  . hydrochlorothiazide 25 MG tablet Take 25 mg by mouth daily. Once daily  . losartan (COZAAR) 50 MG tablet Take 50 mg by mouth daily.  . metFORMIN (GLUCOPHAGE) 500 MG tablet Take 1,000 mg by mouth 2 (two) times daily with a meal.  . metoprolol succinate (TOPROL-XL) 50 MG 24 hr tablet Take 50 mg by mouth daily. Take with or immediately following a meal.  . simvastatin (ZOCOR) 40 MG tablet Take 40 mg by mouth every evening.  . cholecalciferol (VITAMIN D) 1000 UNITS tablet Take 1,000 Units by mouth daily.  . cyclobenzaprine (FLEXERIL) 5 MG tablet Take 1 tablet (5 mg total) by mouth at bedtime as needed for muscle spasms.  . ibuprofen (MOTRIN IB) 200 MG tablet Take 200 mg by mouth every 6 (six) hours as needed (FOR PAIN).   . Multiple Vitamin (MULTIVITAMIN) tablet Take   1 tablet by mouth daily.  . Omega-3 Fatty Acids  (FISH OIL PO) Take 1 tablet by mouth daily.    FAMILY HISTORY:  Her indicated that her mother is alive. She indicated that her father is deceased.   SOCIAL HISTORY: She  reports that she has never smoked. She has never used smokeless tobacco. She reports that she drinks alcohol. She reports that she does not use illicit drugs.  REVIEW OF SYSTEMS:   Gen: Denies fever, chills, weight change, fatigue, night sweats HEENT: Denies blurred vision, double vision, hearing loss, tinnitus, sinus congestion, rhinorrhea, sore throat, neck stiffness, dysphagia PULM: per HPI CV: per HPI GI: Denies abdominal pain, nausea, vomiting, diarrhea, hematochezia, melena, constipation, change in bowel habits GU: Denies dysuria, hematuria, polyuria, oliguria, urethral discharge Endocrine: Denies hot or cold intolerance, polyuria, polyphagia or appetite change Derm: Denies rash, dry skin, scaling or peeling skin change Heme: Denies easy bruising, bleeding, bleeding gums Neuro: Denies headache, numbness, weakness, slurred speech, loss of memory or consciousness   SUBJECTIVE:  As above  VITAL SIGNS: BP 123/72 mmHg  Pulse 47  Temp(Src) 99 F (37.2 C) (Oral)  Resp 12  SpO2 98%  HEMODYNAMICS:    VENTILATOR SETTINGS:    INTAKE / OUTPUT: I/O last 3 completed shifts: In: 3974.6 [P.O.:60; I.V.:3914.6] Out: 2475 [Urine:2350; Blood:125]  PHYSICAL EXAMINATION: General:  Comfortable in bed Neuro:  Awake alert, oriented 4 HEENT:  Normocephalic/atraumatic Cardiovascular:  Bradycardic rate, normal rhythm, no murmurs gallops or rubs, no JVD Lungs:  Scant crackles in the left base that clear with a deep breath, otherwise clear, normal effort Abdomen:  Bowel sounds positive, nontender Musculoskeletal:  Normal bulk and tone Skin:  No rash or skin breakdown  LABS:  BMET  Recent Labs Lab 10/04/15 0517  NA 136  K 4.3  CL 101  CO2 27  BUN 10  CREATININE 0.71  GLUCOSE 128*     Electrolytes  Recent Labs Lab 10/04/15 0517  CALCIUM 8.8*  MG 2.0  PHOS 4.0    CBC  Recent Labs Lab 10/04/15 0517  WBC 10.5  HGB 12.7  HCT 39.0  PLT 241    Coag's No results for input(s): APTT, INR in the last 168 hours.  Sepsis Markers  Recent Labs Lab 10/04/15 0620  LATICACIDVEN 1.6    ABG No results for input(s): PHART, PCO2ART, PO2ART in the last 168 hours.  Liver Enzymes No results for input(s): AST, ALT, ALKPHOS, BILITOT, ALBUMIN in the last 168 hours.  Cardiac Enzymes  Recent Labs Lab 10/04/15 0620  TROPONINI <0.03    Glucose  Recent Labs Lab 10/03/15 0639 10/03/15 1228 10/04/15 0714  GLUCAP 101* 163* 121*    Imaging Dg Chest Port 1 View  10/04/2015  CLINICAL DATA:  Chest pain/ burning, hypoxemia, nonproductive cough, and desaturation. EXAM: PORTABLE CHEST 1 VIEW COMPARISON:  03/08/2014 FINDINGS: The cardiac silhouette is upper limits of normal in size. The lungs are clear. No pleural effusion or pneumothorax is identified. No acute osseous abnormality is seen. IMPRESSION: No active disease. Electronically Signed   By: Allen  Grady M.D.   On: 10/04/2015 07:12   10/04/2015 chest x-ray images personally reviewed showing a slightly enlarged cardiac silhouette but no pulmonary parenchymal abnormality  STUDIES:  10/04/2015 echocardiogram pending   DISCUSSION: This is a 60-year-old female who on postoperative day 1 after a hysterectomy developed transient chest pain which is atypical in nature, and had nocturnal hypoxemia. The chest pain differential diagnosis includes ischemia (less likely), even less   likely pulmonary embolism, or gastroesophageal reflux related. Given the d-dimer, which is negative,the likelihood of a pulmonary embolism is exceedingly low and there is no role for a CT angiogram at this time. I agree with cardiology workup. Her mild transient hypoxemia in the middle of the evening is not unexpected in the setting of  obesity and recent abdominal surgery. This is explained by atelectasis. It has now resolved.  ASSESSMENT / PLAN:  PULMONARY A: Atelectasis Acute hypoxemia> resolved P:   Out of bed Incentive spirometry Monitor O2 saturation, keep above 88% Call us back if hypoxemia recurs  CARDIOVASCULAR A:  Chest pain, atypical P:  Agree with cardiology assessment Will discontinue CT angiogram order   Brent McQuaid, MD [place] [facility] Pager: [phone] Cell: [phone] After 3pm or if no response, call [phone]   10/04/2015, 9:32 AM    

## 2015-10-04 NOTE — Addendum Note (Signed)
Addendum  created 10/04/15 1336 by Flossie Dibble, CRNA   Modules edited: Notes Section   Notes Section:  File: [id number]

## 2015-10-04 NOTE — Progress Notes (Signed)
1 Day Post-Op Procedure(s) (LRB): LAPAROSCOPIC ASSISTED VAGINAL HYSTERECTOMY WITH SALPINGECTOMY (Bilateral)  Subjective: Pt moved to AICU after episode of "chest burning" which improved initially with GI cocktail, but returned at around 6am.  Now improved.  Pt has received aspirin 81mg  in the interim. She had episodes of asymptomatic bradycardia in the earlier evening after which she had coffee in hopes it would raise her heart rate. She had a single does of Fentanyl around 3pm as her only post op narcotic.  She has received alternating ketorolac and acetaminophen over the last 24 hours. Yesterday morning at 5 am she took metroprolo ER, losartan and pantaprazole all preoperatively. She did have an episode of hypotension early during her surgical procedure (before trochar placement) which required treatment by anesthesia. This morning the Patient reports no nausea or vomiting.  Minimal incisional pain, tolerating PO's  But has not passed flatus.    Objective: I have reviewed patient's vital signs, intake and output, medications and labs.  General: alert and cooperative Resp: clear to auscultation bilaterally Cardio: regular rate and rhythm, S1, S2 normal, no murmur, click, rub or gallop GI: soft, non-tender; bowel sounds normal; no masses,  no organomegaly Extremities: extremities normal, atraumatic, no cyanosis or edema Vaginal Bleeding: scant  Assessment: s/p Procedure(s): LAPAROSCOPIC ASSISTED VAGINAL HYSTERECTOMY WITH SALPINGECTOMY (Bilateral): post operative chest pain in pt with hx of hypertension, arrythmmia,  diabetes, and GERD  Plan: Per recommendation of intensivist she had troponin and cxr both of which are pending.  12 lead EKG done Pt discussed with on call cardiologist from [facility] who will see the pt   Ladonna Vanorder P 10/04/2015, 7:04 AM

## 2015-10-04 NOTE — Progress Notes (Signed)
[facility] Progress Note Patient Name: Alexandra Walker DOB: 1956/02/22 MRN: [id number]   Date of Service  10/04/2015  HPI/Events of Note  cxr loosk claear Labs -  Recent Labs Lab 10/04/15 0517  CREATININE 0.71      eICU Interventions  Get ct angio rule out PE Call [phone] or the [facility] MD - Dr Lake Bells for any questions     Intervention Category Minor Interventions: Other: Evaluation Type: Other  Tylia Ewell 10/04/2015, 7:09 AM

## 2015-10-04 NOTE — Progress Notes (Signed)
Echo shows benign findings, no suggestion of LV ischemia or RV overload. First troponin normal. Second set being drawn now. If the second set is normal, can DC and we will arrange f/u with Dr. Angelena Form

## 2015-10-04 NOTE — Discharge Summary (Signed)
Physician Discharge Summary  Patient ID: Alexandra Walker MRN: [id number] DOB/AGE: 1955-06-01 60 y.o.  Admit date: 10/03/2015 Discharge date: 10/04/2015   Discharge Diagnoses:  Post menopausal Bleeding,   LGSIL  Active Problems:   LGSIL (low grade squamous intraepithelial dysplasia)   PMB (postmenopausal bleeding)   Combined pelvic and perineal pain in female   Post-menopausal bleeding   Chest pain   Hypoxemia   Atelectasis   Operation: Laparoscopically Assisted Vaginal Hysterectomy with Bilateral Salpingectomy   Discharged Condition: Good  Hospital Course: On the date of admission the patient underwent the aforementioned procedures and tolerated them well.  Post operative course was marked by an episode of chest discomfort Early in the morning. On postoperative day 1. Consequently an  evaluation for a  cardiopulmonary etiology ensued  with  normal findings.  By post operative day #1 the patient had resumed bowel and bladder function and achieved resolution of her chest discomfort. Her post operative hemoglobin was 12.7. Disposition: 01-Home or Self Care  Discharge Medications:    Medication List    STOP taking these medications        MOTRIN IB 200 MG tablet  Generic drug:  ibuprofen     multivitamin tablet      TAKE these medications        aspirin 325 MG tablet  Take 325 mg by mouth daily.     cholecalciferol 1000 units tablet  Commonly known as:  VITAMIN D  Take 1,000 Units by mouth daily.     cyclobenzaprine 5 MG tablet  Commonly known as:  FLEXERIL  Take 1 tablet (5 mg total) by mouth at bedtime as needed for muscle spasms.     FISH OIL PO  Take 1 tablet by mouth daily.     hydrochlorothiazide 25 MG tablet  Commonly known as:  HYDRODIURIL  Take 25 mg by mouth daily. Once daily     losartan 50 MG tablet  Commonly known as:  COZAAR  Take 50 mg by mouth daily.     Magnesium 500 MG Caps  Take 1 capsule by mouth at bedtime.     metFORMIN 500 MG tablet   Commonly known as:  GLUCOPHAGE  Take 1,000 mg by mouth 2 (two) times daily with a meal.     metoprolol succinate 50 MG 24 hr tablet  Commonly known as:  TOPROL-XL  Take 50 mg by mouth daily. Take with or immediately following a meal.     ondansetron 4 MG tablet  Commonly known as:  ZOFRAN  Take 1 tablet (4 mg total) by mouth every 8 (eight) hours as needed for nausea or vomiting.     pantoprazole 40 MG tablet  Commonly known as:  PROTONIX  Take 40 mg by mouth daily as needed (FOR UPSET STOMACH).     simvastatin 40 MG tablet  Commonly known as:  ZOCOR  Take 40 mg by mouth every evening.     traMADol 50 MG tablet  Commonly known as:  ULTRAM  Take 1 tablet (50 mg total) by mouth every 4 (four) hours as needed for severe pain (start tramadol if patient has taken oral food with nausea).     Turmeric 450 MG Caps  Take 900 mg by mouth daily.         Follow-up: Dr. Lorriane Shire P. Delle Andrzejewski on November 09, 2015 at 11 a.m.   SignedEarnstine Regal, PA-C 10/04/2015, 5:14 PM

## 2015-10-04 NOTE — Progress Notes (Signed)
Pt C/O burning to mid chest 4-5/10," the initial burning shoot to the back" pt stated. B/P141/63 (82) HR 54 RR 18 02 Sat 99% with 2L FiO2. Dr. Jannifer Rodney call and informed. Order received for 12 Lead EKG, GI Cocktail and Protonix 40 mg PO . Same administered.

## 2015-10-04 NOTE — Consult Note (Signed)
Reason for Consult: Chest pain  Requesting Physician: Hay[place]  Cardiologist: Angelena Form  HPI: This is a 59 y.o. female with a past medical history significant for HTN, DM type 2, HLP, strong family history of early onset CAD with complaints of chest pain following elective transvaginal hysterectomy performed yesterday for postmenopausal bleeding. The procedure was uncomplicated.   She had some brief asymptomatic bradycardia in the immediate post anesthesia.. Blood pressure has been within normal range. She had transient hypoxemia while asleep without oxygen supplementation down to 88%, but otherwise has had normal oxygenation. Around 5:00 this morning she began experiencing burning chest discomfort that was just "sitting" in the precordial area. Her echocardiogram showed mild sinus bradycardia, preexistent left anterior fascicular block and subtle T-wave changes in the inferior lateral leads, very similar to an electro-cardiogram performed in April. No acute changes are seen. She has no complaints of shortness of breath. Pain does not increase with deep breathing or cough. She has not had any problems with swelling or tenderness in either leg and has sequential compression devices on. Had serious bleeding following surgery. A "GI cocktail" did not appear to provide much relief, but then she was administered aspirin. Within 5 minutes achieving aspirin she felt some improvement. The chest discomfort is now rated as a 3-4/10. She appears comfortable. A CT angiogram has been ordered and labs have been sent but are not yet available for review.  Prior to surgery she was very active, digging in her yard without any chest discomfort or shortness of breath. She works as a Geologist, engineering at [employer]. She denies a history of syncope. She has remote problems with palpitations, very well controlled with beta blocker therapy. Her beta blocker has been continued throughout the perioperative period.  Her blood pressure is well controlled chronically. Her medications include a statin. 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 She had mild pancreatitis, possibly related to treatment with Victoza. She had an uncomplicated laparoscopic cholecystectomy last year.  She had a normal stress echo in 2005 and a normal nuclear stress test in 2009. A remote carotid ultrasound did not show evidence of plaque.  Her father had his myocardial infarction in his 58s and died at age 93. Her mother had bypass surgery at its.  PMHx:  Past Medical History  Diagnosis Date  . Hypertension   . Hyperlipidemia   . IBS (irritable bowel syndrome)   . Glucose intolerance (impaired glucose tolerance)   . PAC (premature atrial contraction)   . Seasonal allergies   . HLA B27 (HLA B27 positive)   . PONV (postoperative nausea and vomiting)   . Paronychia   . Trigger ring finger of left hand   . Diabetes ([place])   . Ischemic colitis ([place])   . Lymphadenopathy   . Chronic depressive disorder   . Circadian rhythm sleep disorder   . Ganglion of knee     left  . Kyphosis   . Morbid obesity ([place])   . Onycholysis   . PMS (premenstrual syndrome)   . Vitamin D deficiency   . Dysrhythmia     PAC, PJC, managed well with toprolol  . Chronic edema     bilateral ankles  . GERD (gastroesophageal reflux disease)     rare  . Migraine headache     history of  . Arthritis   .  Sacroiliitis ([place])   . Lateral epicondylitis of right elbow   . Anticardiolipin syndrome [facility])    Past Surgical History  Procedure Laterality Date  . Abdominal laparascopic  92  . Left finger surgery    . Colonoscopy w/ biopsies  multiple  . Esophagogastroduodenoscopy  multiple  . Knee arthroscopy  07,10    left  . Dilation and curettage of uterus  07,11  . Breast surgery  2003    bx  lt-neg  . Cervical cone biopsy    . Mass excision Left 12/24/2012    Procedure: LEFT EXCISION OF PALMAR MASS X TWO;  Surgeon: Roseanne Kaufman, MD;  Location: [place];  Service: Orthopedics;  Laterality: Left;  . Diagnostic laparoscopy    . Cholecystectomy N/A 10/26/2014    Procedure: LAPAROSCOPIC CHOLECYSTECTOMY;  Surgeon: Coralie Keens, MD;  Location: [place];  Service: General;  Laterality: N/A;  . Bladder surgery      FAMHx: Family History  Problem Relation Age of Onset  . Heart attack Father   . Heart disease Father   . Coronary artery disease Mother   . Hypertension Mother   . Heart disease Mother   . Hypertension Sister   . Hypertension Brother   . Colon cancer Neg Hx   . Heart attack Mother     SOCHx:  reports that she has never smoked. She has never used smokeless tobacco. She reports that she drinks alcohol. She reports that she does not use illicit drugs.  ALLERGIES: Allergies  Allergen Reactions  . Ace Inhibitors Other (See Comments)    coughing  . Percocet [Oxycodone-Acetaminophen] Nausea And Vomiting  . Vicodin [Hydrocodone-Acetaminophen] Nausea And Vomiting  . Victoza [Liraglutide] Other (See Comments)    pancreatitis    ROS: Pertinent items noted in HPI and remainder of comprehensive ROS otherwise negative.  HOME MEDICATIONS: Prescriptions prior to admission  Medication Sig Dispense Refill Last Dose  . aspirin 325 MG tablet Take 325 mg by mouth daily.   Past Week at Unknown time  . hydrochlorothiazide 25 MG tablet Take 25 mg by mouth daily. Once daily   10/02/2015 at Unknown time  . losartan (COZAAR) 50 MG tablet Take 50 mg by mouth daily.   10/03/2015 at Unknown time  . Magnesium 500 MG CAPS Take 1 capsule by mouth at bedtime.     . metFORMIN (GLUCOPHAGE) 500 MG tablet Take 1,000 mg by mouth 2 (two) times daily with a meal.   10/02/2015 at Unknown time  . metoprolol succinate (TOPROL-XL) 50 MG 24 hr tablet Take 50 mg by  mouth daily. Take with or immediately following a meal.   10/03/2015 at Unknown time  . pantoprazole (PROTONIX) 40 MG tablet Take 40 mg by mouth daily as needed (FOR UPSET STOMACH).   10/03/2015 at Unknown time  . simvastatin (ZOCOR) 40 MG tablet Take 40 mg by mouth every evening.   10/02/2015 at Unknown time  . Turmeric 450 MG CAPS Take 900 mg by mouth daily.     . cholecalciferol (VITAMIN D) 1000 UNITS tablet Take 1,000 Units by mouth daily.   Taking  . cyclobenzaprine (FLEXERIL) 5 MG tablet Take 1 tablet (5 mg total) by mouth at bedtime as needed for muscle spasms. 30 tablet 2 Taking  . ibuprofen (MOTRIN IB) 200 MG tablet Take 200 mg by mouth every 6 (six) hours as needed (FOR PAIN).    Taking  . Multiple Vitamin (MULTIVITAMIN) tablet Take 1 tablet by mouth daily.  Taking  . Omega-3 Fatty Acids (FISH OIL PO) Take 1 tablet by mouth daily.   Taking    HOSPITAL MEDICATIONS: Scheduled: . acetaminophen  1,000 mg Oral Q6H  . docusate sodium  100 mg Oral BID  . hydrochlorothiazide  25 mg Oral Daily  . ketorolac  30 mg Intravenous Q6H  . losartan  50 mg Oral Daily  . metFORMIN  1,000 mg Oral BID WC  . metoprolol succinate  50 mg Oral Daily  . simvastatin  40 mg Oral QPM    VITALS: Blood pressure 139/71, pulse 52, temperature 99 F (37.2 C), temperature source Oral, resp. rate 14, SpO2 97 %.  PHYSICAL EXAM:  General: Alert, oriented x3, no distress. Obese Head: no evidence of trauma, PERRL, EOMI, no exophtalmos or lid lag, no myxedema, no xanthelasma; normal ears, nose and oropharynx Neck: normal jugular venous pulsations and no hepatojugular reflux; brisk carotid pulses without delay and no carotid bruits Chest: clear to auscultation, no signs of consolidation by percussion or palpation, normal fremitus, symmetrical and full respiratory excursions Cardiovascular: normal position and quality of the apical impulse, regular rhythm, normal first heart sound and normal second heart sound, no  rubs or gallops, no murmur Abdomen: no tenderness or distention, no masses by palpation, no abnormal pulsatility or arterial bruits, normal bowel sounds, no hepatosplenomegaly. Mild post surgical discomfort Extremities: no clubbing, cyanosis;  no edema; 2+ radial, ulnar and brachial pulses bilaterally; 2+ right femoral, posterior tibial and dorsalis pedis pulses; 2+ left femoral, posterior tibial and dorsalis pedis pulses; no subclavian or femoral bruits Neurological: grossly nonfocal   LABS  CBC  Recent Labs  10/04/15 0517  WBC 10.5  HGB 12.7  HCT 39.0  MCV 85.2  PLT 413   Basic Metabolic Panel  Recent Labs  10/04/15 0517  NA 136  K 4.3  CL 101  CO2 27  GLUCOSE 128*  BUN 10  CREATININE 0.71  CALCIUM 8.8*  MG 2.0  PHOS 4.0    IMAGING: Dg Chest Port 1 View  10/04/2015  CLINICAL DATA:  Chest pain/ burning, hypoxemia, nonproductive cough, and desaturation. EXAM: PORTABLE CHEST 1 VIEW COMPARISON:  03/08/2014 FINDINGS: The cardiac silhouette is upper limits of normal in size. The lungs are clear. No pleural effusion or pneumothorax is identified. No acute osseous abnormality is seen. IMPRESSION: No active disease. Electronically Signed   By: Logan Bores M.D.   On: 10/04/2015 07:12    ECG: NSR, LAFB, nonspecific T wave changes inferior and lateral  TELEMETRY: NSR  IMPRESSION/RECOMMENDATION:  Alexandra Walker has atypical chest discomfort that does not have the usual pleuritic characteristics of pulmonary embolism, but obviously is at risk for this disorder due to the postoperative site setting and obesity. CT angiography has been ordered.  She had excellent functional status and no angiogram preoperatively, but has numerous coronary risk factors including postmenopausal state, strong family history of premature CAD, hypertension, hyperlipidemia and type 2 diabetes mellitus. The ECG does not show high risk findings and is unchanged from previous tracings. Cardiac enzymes are  pending.  At this point neither anticoagulation, nor emergency angiography appear justified. Would continue daily aspirin. Recheck serial cardiac enzymes. Repeat echocardiogram if her symptoms worsen. Will check a bedside echocardiogram.  If the echocardiogram does not show regional wall motion or right ventricular enlargement, if CT angiography shows normal findings and cardiac enzymes are normal, would further explore a possible gastroesophageal reason for her symptoms.  Will follow up throughout the day.  Time Spent  Directly with Patient: 60 minutes  Sanda Klein, MD, [facility] [phone] office [phone] pager   10/04/2015, 7:47 AM

## 2015-10-04 NOTE — Progress Notes (Signed)
Pt was lying in bed when [facility] arrived. Nurse was bedside. Pt said she had been running low bp during the night and was having chest pains and it made her scared. She said her dad died when he was 47 and her mom passed a couple years ago. She said she knows God has not given her a spirit of fear and she wanted prayer. Pt said she does not want to go to the cath lab and all that. [place] listened to pt; provided encouragement; and had prayer w/pt and PA who joined during the visit.  Please page if additional support is needed. Chaplain [name] Haber, M.Div.   10/04/15 0600  Clinical Encounter Type  Visited With Patient

## 2015-10-11 ENCOUNTER — Telehealth: Payer: Self-pay | Admitting: Cardiovascular Disease

## 2015-10-11 NOTE — Telephone Encounter (Signed)
Pt would like to know if it is okay for her to wait to see Dr. Angelena Form on July 24 th and Cancell the post hospital visit with Ermalinda Barrios PA on 5/30. Pt said that she fells better now. Pt was having a  Slow heart beat, she feels better now.

## 2015-10-11 NOTE — Telephone Encounter (Signed)
If she is feeling ok, she can wait to see me. Alexandra Walker

## 2015-10-11 NOTE — Telephone Encounter (Signed)
New MEssage  Pt wanted to know if she can cancel her EPH w/ Sharyn Lull on 5/30 and just wait to see Dr Angelena Form on 7/24, if she is feeling fine. Please call back and discuss.

## 2015-10-12 NOTE — Telephone Encounter (Signed)
Pt is aware to wait to see Dr. Angelena Form on 12/11/15. Appointment with Ermalinda Barrios PA on 5/30 was canceled.

## 2015-10-17 ENCOUNTER — Ambulatory Visit: Payer: 59 | Admitting: Physician Assistant

## 2015-10-17 ENCOUNTER — Other Ambulatory Visit: Payer: Self-pay | Admitting: Family Medicine

## 2015-10-18 MED FILL — CYCLOBENZAPRINE 5 MG TABLET: 5 | 30 days supply | Qty: 30 | Fill #0

## 2015-11-01 MED FILL — LOSARTAN POTASSIUM 50 MG TA: 50 | 60 days supply | Qty: 60 | Fill #2

## 2015-11-01 MED FILL — METFORMIN HCL ER 500 MG TAB: 500 | 90 days supply | Qty: 360 | Fill #0

## 2015-11-01 MED FILL — CLOBETASOL 0.05% CREAM: 0.05 | 30 days supply | Qty: 60 | Fill #1

## 2015-11-08 DIAGNOSIS — R300 Dysuria: Secondary | ICD-10-CM | POA: Diagnosis not present

## 2015-11-10 ENCOUNTER — Encounter: Payer: Self-pay | Admitting: Cardiovascular Disease

## 2015-11-15 ENCOUNTER — Telehealth: Payer: Self-pay | Admitting: Cardiovascular Disease

## 2015-11-15 NOTE — Telephone Encounter (Signed)
I spoke with pt. She reports she is feeling well but does have questions she would like to discuss with Dr. Angelena Form.  Appt has been made for her to see Dr. Angelena Form on September 25,2017 but she is not sure of her work schedule that day.  Appt moved to August 24,2017 at 3:00 as she is off that day and will be able to be here for appt.

## 2015-11-15 NOTE — Telephone Encounter (Signed)
New message     The pt wants to speak with nurse regarding the reschedule, appointment I made for September cause everything else interfered with the work schedule, and wants to know if she really needs the appointment. I had to move her appointment off of July 24 th. Thanks

## 2015-11-18 LAB — HM DIABETES FOOT EXAM

## 2015-12-11 ENCOUNTER — Ambulatory Visit: Payer: 59 | Admitting: Cardiovascular Disease

## 2015-12-13 DIAGNOSIS — R21 Rash and other nonspecific skin eruption: Secondary | ICD-10-CM | POA: Diagnosis not present

## 2015-12-13 MED FILL — valACYclovir HCL 1 GM TABS: 1 | 7 days supply | Qty: 21 | Fill #0

## 2015-12-15 DIAGNOSIS — R809 Proteinuria, unspecified: Secondary | ICD-10-CM | POA: Diagnosis not present

## 2015-12-15 DIAGNOSIS — E782 Mixed hyperlipidemia: Secondary | ICD-10-CM | POA: Diagnosis not present

## 2015-12-15 DIAGNOSIS — E118 Type 2 diabetes mellitus with unspecified complications: Secondary | ICD-10-CM | POA: Diagnosis not present

## 2015-12-19 DIAGNOSIS — R21 Rash and other nonspecific skin eruption: Secondary | ICD-10-CM | POA: Diagnosis not present

## 2015-12-19 DIAGNOSIS — E782 Mixed hyperlipidemia: Secondary | ICD-10-CM | POA: Diagnosis not present

## 2015-12-19 DIAGNOSIS — I10 Essential (primary) hypertension: Secondary | ICD-10-CM | POA: Diagnosis not present

## 2015-12-19 DIAGNOSIS — Z0000 Encounter for general adult medical examination without abnormal findings: Secondary | ICD-10-CM | POA: Diagnosis not present

## 2015-12-19 DIAGNOSIS — R8290 Unspecified abnormal findings in urine: Secondary | ICD-10-CM | POA: Diagnosis not present

## 2015-12-19 DIAGNOSIS — R809 Proteinuria, unspecified: Secondary | ICD-10-CM | POA: Diagnosis not present

## 2015-12-19 DIAGNOSIS — K219 Gastro-esophageal reflux disease without esophagitis: Secondary | ICD-10-CM | POA: Diagnosis not present

## 2015-12-19 DIAGNOSIS — E1129 Type 2 diabetes mellitus with other diabetic kidney complication: Secondary | ICD-10-CM | POA: Diagnosis not present

## 2015-12-25 MED FILL — CEPHALEXIN 500 MG CAPSULE: 500 | 7 days supply | Qty: 14 | Fill #0

## 2015-12-27 ENCOUNTER — Encounter: Payer: Self-pay | Admitting: Cardiovascular Disease

## 2016-01-05 DIAGNOSIS — L570 Actinic keratosis: Secondary | ICD-10-CM | POA: Diagnosis not present

## 2016-01-05 DIAGNOSIS — D239 Other benign neoplasm of skin, unspecified: Secondary | ICD-10-CM | POA: Diagnosis not present

## 2016-01-11 ENCOUNTER — Ambulatory Visit (INDEPENDENT_AMBULATORY_CARE_PROVIDER_SITE_OTHER): Payer: 59 | Admitting: Cardiovascular Disease

## 2016-01-11 VITALS — BP 130/90 | HR 75 | Ht 66.0 in | Wt 254.0 lb

## 2016-01-11 DIAGNOSIS — R072 Precordial pain: Secondary | ICD-10-CM | POA: Diagnosis not present

## 2016-01-11 DIAGNOSIS — I491 Atrial premature depolarization: Secondary | ICD-10-CM

## 2016-01-11 LAB — BASIC METABOLIC PANEL
BUN: 14 mg/dL (ref 7–25)
CALCIUM: 9.8 mg/dL (ref 8.6–10.4)
CO2: 29 mmol/L (ref 20–31)
CREATININE: 0.69 mg/dL (ref 0.50–1.05)
Chloride: 102 mmol/L (ref 98–110)
Glucose, Bld: 103 mg/dL — ABNORMAL HIGH (ref 65–99)
POTASSIUM: 4.2 mmol/L (ref 3.5–5.3)
SODIUM: 141 mmol/L (ref 135–146)

## 2016-01-11 NOTE — Patient Instructions (Signed)
Medication Instructions:  Your physician recommends that you continue on your current medications as directed. Please refer to the Current Medication list given to you today.   Labwork: Lab work to be done today-BMP  Testing/Procedures: Your physician has requested that you have cardiac CT. Cardiac computed tomography (CT) is a painless test that uses an x-ray machine to take clear, detailed pictures of your heart. For further information please visit [email or web address]. Please follow instruction sheet as given.    Follow-Up: Your physician wants you to follow-up in: 12 months.  You will receive a reminder letter in the mail two months in advance. If you don't receive a letter, please call our office to schedule the follow-up appointment.   Any Other Special Instructions Will Be Listed Below (If Applicable).     If you need a refill on your cardiac medications before your next appointment, please call your pharmacy.

## 2016-01-11 NOTE — Progress Notes (Signed)
Chief Complaint  Patient presents with  . Premature Atrial Contractions    f/u     History of Present Illness: 60 yo WF with history of HTN, HLD, PACs, irritable bowel syndrome who is here today for cardiac follow up. I saw her as a new patient May 2013 to establish cardiology care. She has been followed in [facility] with Dr. Linus Salmons. She has had several stress tests that were normal with negative stress echo in 2003, 2005 and a normal stress myoview in 2009. She describes chronic chest pain. She has history of PACs that are controlled with Toprol. 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 She has glucose intolerance. Carotid artery dopplers were normal 12/07. She has chronic right chest wall pain that she believes is due to her back issues. She also has esophagitis and had an esophageal dilatation 2014.   She is here today for follow up. She continues to have right sided chest pain, mostly with rest. There is no exertional component. She also has had several episodes of left sided chest pain. Occasional dyspnea.   Primary Care Physician: Garlan Fillers, MD   Past Medical History:  Diagnosis Date  . 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[place])   . Arthritis   . Chronic depressive disorder   . Chronic edema    bilateral ankles  . Circadian rhythm sleep disorder   . Diabetes ([place])   . Dysrhythmia    PAC, PJC, managed well with toprolol  . Ganglion of knee    left  . GERD (gastroesophageal reflux disease)    rare  . Glucose intolerance (impaired glucose tolerance)   . HLA B27 (HLA B27 positive)   . Hyperlipidemia   . Hypertension   . IBS (irritable bowel syndrome)   . Ischemic colitis ([place])   . Kyphosis   . Lateral epicondylitis of right elbow   . Lymphadenopathy   . Migraine headache    history of  . Morbid obesity ([place])   . Onycholysis   . PAC (premature atrial contraction)   . Paronychia   . PMS (premenstrual syndrome)   . PONV (postoperative nausea  and vomiting)   . Sacroiliitis ([place])   . Seasonal allergies   . Trigger ring finger of left hand   . Vitamin D deficiency     Past Surgical History:  Procedure Laterality Date  . abdominal laparascopic  92  . BLADDER SURGERY    . BREAST SURGERY  2003   bx lt-neg  . CERVICAL CONE BIOPSY    . CHOLECYSTECTOMY N/A 10/26/2014   Procedure: LAPAROSCOPIC CHOLECYSTECTOMY;  Surgeon: Coralie Keens, MD;  Location: [place];  Service: General;  Laterality: N/A;  . COLONOSCOPY W/ BIOPSIES  multiple  . DIAGNOSTIC LAPAROSCOPY    . DILATION AND CURETTAGE OF UTERUS  07,11  . ESOPHAGOGASTRODUODENOSCOPY  multiple  . KNEE ARTHROSCOPY  07,10   left  . LAPAROSCOPIC VAGINAL HYSTERECTOMY WITH SALPINGO OOPHORECTOMY Bilateral 10/03/2015   Procedure: LAPAROSCOPIC ASSISTED VAGINAL HYSTERECTOMY WITH SALPINGECTOMY;  Surgeon: Eldred Manges, MD;  Location: [facility];  Service: Gynecology;  Laterality: Bilateral;  . left finger surgery    . MASS EXCISION Left 12/24/2012   Procedure: LEFT EXCISION OF PALMAR MASS X TWO;  Surgeon: Roseanne Kaufman, MD;  Location: [place];  Service: Orthopedics;  Laterality: Left;    Current Outpatient Prescriptions  Medication Sig Dispense Refill  . aspirin 325 MG tablet Take 325 mg by mouth daily.    Marland Kitchen  cholecalciferol (VITAMIN D) 1000 UNITS tablet Take 1,000 Units by mouth daily.    . cyclobenzaprine (FLEXERIL) 5 MG tablet Take 1 tablet (5 mg total) by mouth at bedtime as needed for muscle spasms. 30 tablet 2  . hydrochlorothiazide 25 MG tablet Take 25 mg by mouth daily. Once daily    . losartan (COZAAR) 50 MG tablet Take 50 mg by mouth daily.    . Magnesium 500 MG CAPS Take 1 capsule by mouth at bedtime.    . metFORMIN (GLUCOPHAGE) 500 MG tablet Take 1,000 mg by mouth 2 (two) times daily with a meal.    . metoprolol succinate (TOPROL-XL) 50 MG 24 hr tablet Take 50 mg by mouth daily. Take with or immediately following a meal.    . Omega-3 Fatty  Acids (FISH OIL PO) Take 1 tablet by mouth daily.    . pantoprazole (PROTONIX) 40 MG tablet Take 40 mg by mouth daily as needed (FOR UPSET STOMACH).    Marland Kitchen simvastatin (ZOCOR) 40 MG tablet Take 40 mg by mouth every evening.    . traMADol (ULTRAM) 50 MG tablet Take 1 tablet (50 mg total) by mouth every 4 (four) hours as needed for severe pain (start tramadol if patient has taken oral food with nausea). 30 tablet 0  . Turmeric 450 MG CAPS Take 900 mg by mouth daily.     No current facility-administered medications for this visit.     Allergies  Allergen Reactions  . Ace Inhibitors Other (See Comments)    coughing  . Oxycodone-Acetaminophen Nausea And Vomiting  . Percocet [Oxycodone-Acetaminophen] Nausea And Vomiting  . Vicodin [Hydrocodone-Acetaminophen] Nausea And Vomiting  . Victoza [Liraglutide] Other (See Comments)    pancreatitis    Social History   Social History  . Marital status: Divorced    Spouse name: N/A  . Number of children: 1  . Years of education: N/A   Occupational History  . [employer]   Social History Main Topics  . Smoking status: Never Smoker  . Smokeless tobacco: Never Used  . Alcohol use 0.0 oz/week     Comment: social  . Drug use: No  . Sexual activity: Not on file   Other Topics Concern  . Not on file   Social History Narrative   Nurse midwife with the [employer]    Family History  Problem Relation Age of Onset  . Heart attack Father   . Heart disease Father   . Coronary artery disease Mother   . Hypertension Mother   . Heart disease Mother   . Heart attack Mother   . Hypertension Sister   . Hypertension Brother   . Colon cancer Neg Hx     Review of Systems:  As stated in the HPI and otherwise negative.   BP 130/90   Pulse 75   Ht '5\' 6"'  (1.676 m)   Wt 254 lb (115.2 kg)   BMI 41.00 kg/m   Physical Examination: General: Well developed, well nourished, NAD  HEENT: OP clear, mucus membranes  moist  SKIN: warm, dry. No rashes. Neuro: No focal deficits  Musculoskeletal: Muscle strength 5/5 all ext  Psychiatric: Mood and affect normal  Neck: No JVD, no carotid bruits, no thyromegaly, no lymphadenopathy.  Lungs:Clear bilaterally, no wheezes, rhonci, crackles Cardiovascular: Regular rate and rhythm. No murmurs, gallops or rubs. Abdomen:Soft. Bowel sounds present. Non-tender.  Extremities: No lower extremity edema. Pulses are 2 + in the bilateral DP/PT.  Echo May  2017: Left ventricle: The cavity size was normal. Systolic function was   normal. The estimated ejection fraction was in the range of 60%   to 65%. Wall motion was normal; there were no regional wall   motion abnormalities. Left ventricular diastolic function   parameters were normal. - Left atrium: The atrium was mildly dilated.  EKG:  EKG is not ordered today. The ekg ordered today demonstrates   Recent Labs: 10/04/2015: BUN 10; Creatinine, Ser 0.71; Hemoglobin 12.7; Magnesium 2.0; Platelets 241; Potassium 4.3; Sodium 136   Lipid Panel No results found for: CHOL, TRIG, HDL, CHOLHDL, VLDL, LDLCALC, LDLDIRECT   Wt Readings from Last 3 Encounters:  01/11/16 254 lb (115.2 kg)  09/25/15 249 lb 6 oz (113.1 kg)  09/05/15 249 lb 3.2 oz (113 kg)     Other studies Reviewed: Additional studies/ records that were reviewed today include: . Review of the above records demonstrates:   Assessment and Plan:   1. Chest pain: Her pain is right sided most often but she did have left sided chest pain following her hysterectomy. She has a FH of CAD. She has a personal history of HTN, HLD and obesity. She had negative stress tests in 2003, 2005 and 2009. Her BP is at goal. Lipids are at goal and managed by primary care. I will arrange a Coronary CTA to exclude CAD.   2. PACs: Well controlled with Toprol. No changes.   Current medicines are reviewed at length with the patient today.  The patient does not have concerns regarding  medicines.  The following changes have been made:  no change  Labs/ tests ordered today include:   Orders Placed This Encounter  Procedures  . CT Heart Morp W/Cta Cor W/Score W/Ca W/Cm &/Or Wo/Cm  . Basic Metabolic Panel (BMET)    Disposition:   FU with me in 12 months   Signed, Lauree Chandler, MD 01/11/2016 3:44 PM    [facility] [place], [place], [place]  [address] Phone: [phone]; Fax: [phone]

## 2016-01-16 DIAGNOSIS — E119 Type 2 diabetes mellitus without complications: Secondary | ICD-10-CM | POA: Diagnosis not present

## 2016-01-16 DIAGNOSIS — H5203 Hypermetropia, bilateral: Secondary | ICD-10-CM | POA: Diagnosis not present

## 2016-01-16 DIAGNOSIS — H17823 Peripheral opacity of cornea, bilateral: Secondary | ICD-10-CM | POA: Diagnosis not present

## 2016-01-18 ENCOUNTER — Encounter: Payer: Self-pay | Admitting: Cardiovascular Disease

## 2016-01-19 DIAGNOSIS — A419 Sepsis, unspecified organism: Secondary | ICD-10-CM

## 2016-01-19 HISTORY — DX: Sepsis, unspecified organism: A41.9

## 2016-01-19 LAB — HM DIABETES EYE EXAM

## 2016-01-19 MED FILL — LOSARTAN POTASSIUM 50 MG TA: 50 | 90 days supply | Qty: 90 | Fill #0 | Status: TO

## 2016-01-19 MED FILL — METOPROLOL SUCC ER 50 MG TA: 50 | 90 days supply | Qty: 90 | Fill #0 | Status: TO

## 2016-01-23 DIAGNOSIS — G8929 Other chronic pain: Secondary | ICD-10-CM | POA: Diagnosis not present

## 2016-01-23 DIAGNOSIS — M546 Pain in thoracic spine: Secondary | ICD-10-CM | POA: Diagnosis not present

## 2016-01-23 DIAGNOSIS — I10 Essential (primary) hypertension: Secondary | ICD-10-CM | POA: Diagnosis not present

## 2016-01-23 DIAGNOSIS — E1121 Type 2 diabetes mellitus with diabetic nephropathy: Secondary | ICD-10-CM | POA: Diagnosis not present

## 2016-01-30 ENCOUNTER — Emergency Department (HOSPITAL_BASED_OUTPATIENT_CLINIC_OR_DEPARTMENT_OTHER): Payer: 59

## 2016-01-30 ENCOUNTER — Inpatient Hospital Stay (HOSPITAL_BASED_OUTPATIENT_CLINIC_OR_DEPARTMENT_OTHER)
Admission: EM | Admit: 2016-01-30 | Discharge: 2016-02-02 | DRG: 872 | Disposition: A | Discharge status: Home / Self Care | Payer: 59 | Attending: Internal Medicine | Admitting: Internal Medicine

## 2016-01-30 ENCOUNTER — Encounter (HOSPITAL_BASED_OUTPATIENT_CLINIC_OR_DEPARTMENT_OTHER): Payer: Self-pay | Admitting: Emergency Medicine

## 2016-01-30 DIAGNOSIS — F329 Major depressive disorder, single episode, unspecified: Secondary | ICD-10-CM | POA: Diagnosis present

## 2016-01-30 DIAGNOSIS — N390 Urinary tract infection, site not specified: Secondary | ICD-10-CM | POA: Diagnosis not present

## 2016-01-30 DIAGNOSIS — R6520 Severe sepsis without septic shock: Secondary | ICD-10-CM | POA: Diagnosis present

## 2016-01-30 DIAGNOSIS — A419 Sepsis, unspecified organism: Principal | ICD-10-CM | POA: Diagnosis present

## 2016-01-30 DIAGNOSIS — E785 Hyperlipidemia, unspecified: Secondary | ICD-10-CM | POA: Diagnosis present

## 2016-01-30 DIAGNOSIS — R7301 Impaired fasting glucose: Secondary | ICD-10-CM

## 2016-01-30 DIAGNOSIS — I471 Supraventricular tachycardia: Secondary | ICD-10-CM | POA: Diagnosis not present

## 2016-01-30 DIAGNOSIS — E1121 Type 2 diabetes mellitus with diabetic nephropathy: Secondary | ICD-10-CM

## 2016-01-30 DIAGNOSIS — K559 Vascular disorder of intestine, unspecified: Secondary | ICD-10-CM | POA: Diagnosis present

## 2016-01-30 DIAGNOSIS — K580 Irritable bowel syndrome with diarrhea: Secondary | ICD-10-CM | POA: Diagnosis present

## 2016-01-30 DIAGNOSIS — Z7982 Long term (current) use of aspirin: Secondary | ICD-10-CM

## 2016-01-30 DIAGNOSIS — R7302 Impaired glucose tolerance (oral): Secondary | ICD-10-CM | POA: Diagnosis present

## 2016-01-30 DIAGNOSIS — Z90710 Acquired absence of both cervix and uterus: Secondary | ICD-10-CM

## 2016-01-30 DIAGNOSIS — K219 Gastro-esophageal reflux disease without esophagitis: Secondary | ICD-10-CM | POA: Diagnosis present

## 2016-01-30 DIAGNOSIS — E559 Vitamin D deficiency, unspecified: Secondary | ICD-10-CM | POA: Diagnosis present

## 2016-01-30 DIAGNOSIS — N12 Tubulo-interstitial nephritis, not specified as acute or chronic: Secondary | ICD-10-CM

## 2016-01-30 DIAGNOSIS — Z8619 Personal history of other infectious and parasitic diseases: Secondary | ICD-10-CM

## 2016-01-30 DIAGNOSIS — E119 Type 2 diabetes mellitus without complications: Secondary | ICD-10-CM | POA: Diagnosis present

## 2016-01-30 DIAGNOSIS — B9620 Unspecified Escherichia coli [E. coli] as the cause of diseases classified elsewhere: Secondary | ICD-10-CM | POA: Diagnosis present

## 2016-01-30 DIAGNOSIS — I248 Other forms of acute ischemic heart disease: Secondary | ICD-10-CM | POA: Diagnosis present

## 2016-01-30 DIAGNOSIS — B961 Klebsiella pneumoniae [K. pneumoniae] as the cause of diseases classified elsewhere: Secondary | ICD-10-CM | POA: Diagnosis not present

## 2016-01-30 DIAGNOSIS — Z87440 Personal history of urinary (tract) infections: Secondary | ICD-10-CM

## 2016-01-30 DIAGNOSIS — R509 Fever, unspecified: Secondary | ICD-10-CM

## 2016-01-30 DIAGNOSIS — G8929 Other chronic pain: Secondary | ICD-10-CM | POA: Diagnosis present

## 2016-01-30 DIAGNOSIS — I10 Essential (primary) hypertension: Secondary | ICD-10-CM | POA: Diagnosis present

## 2016-01-30 DIAGNOSIS — Z6841 Body Mass Index (BMI) 40.0 and over, adult: Secondary | ICD-10-CM | POA: Diagnosis not present

## 2016-01-30 DIAGNOSIS — E1159 Type 2 diabetes mellitus with other circulatory complications: Secondary | ICD-10-CM | POA: Diagnosis present

## 2016-01-30 DIAGNOSIS — Z8249 Family history of ischemic heart disease and other diseases of the circulatory system: Secondary | ICD-10-CM

## 2016-01-30 DIAGNOSIS — D6861 Antiphospholipid syndrome: Secondary | ICD-10-CM | POA: Diagnosis present

## 2016-01-30 DIAGNOSIS — Z888 Allergy status to other drugs, medicaments and biological substances status: Secondary | ICD-10-CM

## 2016-01-30 DIAGNOSIS — D696 Thrombocytopenia, unspecified: Secondary | ICD-10-CM | POA: Diagnosis present

## 2016-01-30 DIAGNOSIS — E876 Hypokalemia: Secondary | ICD-10-CM | POA: Diagnosis present

## 2016-01-30 DIAGNOSIS — R1031 Right lower quadrant pain: Secondary | ICD-10-CM | POA: Diagnosis not present

## 2016-01-30 DIAGNOSIS — N3289 Other specified disorders of bladder: Secondary | ICD-10-CM | POA: Diagnosis not present

## 2016-01-30 DIAGNOSIS — N10 Acute tubulo-interstitial nephritis: Secondary | ICD-10-CM

## 2016-01-30 DIAGNOSIS — I480 Paroxysmal atrial fibrillation: Secondary | ICD-10-CM | POA: Diagnosis not present

## 2016-01-30 DIAGNOSIS — Z9049 Acquired absence of other specified parts of digestive tract: Secondary | ICD-10-CM

## 2016-01-30 DIAGNOSIS — Z885 Allergy status to narcotic agent status: Secondary | ICD-10-CM

## 2016-01-30 DIAGNOSIS — Z7984 Long term (current) use of oral hypoglycemic drugs: Secondary | ICD-10-CM

## 2016-01-30 DIAGNOSIS — Z79899 Other long term (current) drug therapy: Secondary | ICD-10-CM

## 2016-01-30 LAB — COMPREHENSIVE METABOLIC PANEL
ALBUMIN: 4 g/dL (ref 3.5–5.0)
ALT: 30 U/L (ref 14–54)
AST: 30 U/L (ref 15–41)
Alkaline Phosphatase: 76 U/L (ref 38–126)
Anion gap: 9 (ref 5–15)
BUN: 13 mg/dL (ref 6–20)
CHLORIDE: 102 mmol/L (ref 101–111)
CO2: 25 mmol/L (ref 22–32)
Calcium: 9.3 mg/dL (ref 8.9–10.3)
Creatinine, Ser: 0.74 mg/dL (ref 0.44–1.00)
GFR calc Af Amer: >60 mL/min (ref >60)
GLUCOSE: 127 mg/dL — AB (ref 65–99)
POTASSIUM: 3.5 mmol/L (ref 3.5–5.1)
Sodium: 136 mmol/L (ref 135–145)
Total Bilirubin: 0.8 mg/dL (ref 0.3–1.2)
Total Protein: 7 g/dL (ref 6.5–8.1)

## 2016-01-30 LAB — URINALYSIS, ROUTINE W REFLEX MICROSCOPIC
Bilirubin Urine: NEGATIVE
Glucose, UA: NEGATIVE mg/dL
Ketones, ur: 15 mg/dL — AB
NITRITE: POSITIVE — AB
PH: 6 (ref 5.0–8.0)
Protein, ur: NEGATIVE mg/dL
SPECIFIC GRAVITY, URINE: 1.021 (ref 1.005–1.030)

## 2016-01-30 LAB — URINE MICROSCOPIC-ADD ON

## 2016-01-30 LAB — CBC WITH DIFFERENTIAL/PLATELET
BASOS ABS: 0 10*3/uL (ref 0.0–0.1)
Basophils Relative: 0 %
EOS ABS: 0 10*3/uL (ref 0.0–0.7)
Eosinophils Relative: 0 %
HCT: 43 % (ref 36.0–46.0)
HEMOGLOBIN: 14.5 g/dL (ref 12.0–15.0)
LYMPHS ABS: 0.4 10*3/uL — AB (ref 0.7–4.0)
Lymphocytes Relative: 4 %
MCH: 28.2 pg (ref 26.0–34.0)
MCHC: 33.7 g/dL (ref 30.0–36.0)
MCV: 83.7 fL (ref 78.0–100.0)
Monocytes Absolute: 0.1 10*3/uL (ref 0.1–1.0)
Monocytes Relative: 1 %
NEUTROS PCT: 95 %
Neutro Abs: 8.2 10*3/uL — ABNORMAL HIGH (ref 1.7–7.7)
Platelets: 207 10*3/uL (ref 150–400)
RBC: 5.14 MIL/uL — AB (ref 3.87–5.11)
RDW: 12.7 % (ref 11.5–15.5)
WBC: 8.7 10*3/uL (ref 4.0–10.5)

## 2016-01-30 LAB — I-STAT CG4 LACTIC ACID, ED
LACTIC ACID, VENOUS: 2.61 mmol/L — AB (ref 0.5–1.9)
Lactic Acid, Venous: 2.41 mmol/L (ref 0.5–1.9)

## 2016-01-30 LAB — HCG, QUANTITATIVE, PREGNANCY: HCG, BETA CHAIN, QUANT, S: 1 m[IU]/mL (ref <5)

## 2016-01-30 MED ORDER — ACETAMINOPHEN 500 MG PO TABS
1000.0000 mg | ORAL_TABLET | Freq: Once | ORAL | Status: AC | PRN
  Administered 2016-01-30: 1000 mg via ORAL
  Filled 2016-01-30: qty 2

## 2016-01-30 MED ORDER — ACETAMINOPHEN 325 MG PO TABS: 650.0000 mg | ORAL_TABLET | Freq: Once | ORAL | Status: DC | PRN

## 2016-01-30 MED ORDER — SODIUM CHLORIDE 0.9 % IV BOLUS (SEPSIS)
1000.0000 mL | Freq: Once | INTRAVENOUS | Status: AC
  Administered 2016-01-30: 1000 mL via INTRAVENOUS

## 2016-01-30 MED ORDER — ONDANSETRON HCL 4 MG/2ML IJ SOLN
4.0000 mg | Freq: Once | INTRAMUSCULAR | Status: DC
  Filled 2016-01-30: qty 2

## 2016-01-30 MED ORDER — VANCOMYCIN HCL IN DEXTROSE 1-5 GM/200ML-% IV SOLN
1000.0000 mg | Freq: Two times a day (BID) | INTRAVENOUS | Status: DC
  Administered 2016-01-31: 1000 mg via INTRAVENOUS
  Filled 2016-01-30 (×4): qty 200

## 2016-01-30 MED ORDER — VANCOMYCIN HCL IN DEXTROSE 1-5 GM/200ML-% IV SOLN
1000.0000 mg | INTRAVENOUS | Status: AC
  Administered 2016-01-30 (×2): 1000 mg via INTRAVENOUS
  Filled 2016-01-30 (×2): qty 200

## 2016-01-30 MED ORDER — PIPERACILLIN-TAZOBACTAM 3.375 G IVPB
3.3750 g | Freq: Three times a day (TID) | INTRAVENOUS | Status: DC
  Administered 2016-01-31: 3.375 g via INTRAVENOUS
  Filled 2016-01-30 (×5): qty 50

## 2016-01-30 MED ORDER — PIPERACILLIN-TAZOBACTAM 3.375 G IVPB 30 MIN
3.3750 g | Freq: Once | INTRAVENOUS | Status: AC
  Administered 2016-01-30: 3.375 g via INTRAVENOUS
  Filled 2016-01-30 (×2): qty 50

## 2016-01-30 MED ORDER — VANCOMYCIN HCL IN DEXTROSE 1-5 GM/200ML-% IV SOLN: 1000.0000 mg | Freq: Once | INTRAVENOUS | Status: DC

## 2016-01-30 NOTE — ED Provider Notes (Signed)
[place] DEPT [facility] Provider Note   CSN: [id number] Arrival date & time: 01/30/16  1915     History   Chief Complaint Chief Complaint  Patient presents with  . Fever    HPI Alexandra Walker is a 60 y.o. female.  The history is provided by the patient.  Fever   This is a new problem. The current episode started 3 to 5 hours ago. The problem occurs constantly. The problem has not changed since onset.The maximum temperature noted was 102 to 102.9 F. Associated symptoms include muscle aches. Pertinent negatives include no chest pain, no sleepiness, no diarrhea, no vomiting, no congestion, no headaches, no sore throat, no tugging at ear and no cough. She has tried ibuprofen for the symptoms. The treatment provided mild relief.   Patient endorses prior recurrent urinary tract infections. She also reports a prior hysterectomy. Since her hysterectomy patient has had multiple urinary tract infections however has not had any typical urinary symptoms with the infections.  Past Medical History:  Diagnosis Date  . 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 Arthritis   . Chronic depressive disorder   . Chronic edema    bilateral ankles  . Circadian rhythm sleep disorder   . Diabetes ([place])   . Dysrhythmia    PAC, PJC, managed well with toprolol  . Ganglion of knee    left  . GERD (gastroesophageal reflux disease)    rare  . Glucose intolerance (impaired glucose tolerance)   . HLA B27 (HLA B27 positive)   . Hyperlipidemia   . Hypertension   . IBS (irritable bowel syndrome)   . Ischemic colitis ([place])   . Kyphosis   . Lateral epicondylitis of right elbow   . Lymphadenopathy   . Migraine headache    history of  . Morbid obesity ([place])   . Onycholysis   . PAC (premature atrial contraction)   . Paronychia   . PMS (premenstrual syndrome)   . PONV (postoperative nausea and vomiting)   . Sacroiliitis ([place])   . Seasonal allergies   . Trigger ring finger of left hand   . Vitamin D  deficiency     Patient Active Problem List   Diagnosis Date Noted  . Sepsis ([place]) 01/30/2016  . Chest pain   . Hypoxemia   . Atelectasis   . 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 Post-menopausal bleeding 10/03/2015  . LGSIL (low grade squamous intraepithelial dysplasia) 08/05/2015  . PMB (postmenopausal bleeding) 08/05/2015  . Low back pain 07/27/2014  . Whiplash 06/04/2012  . Chest pain 09/19/2011  . PAC (premature atrial contraction) 09/19/2011  . 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 BACK PAIN WITH RADICULOPATHY 12/14/2009  . OBESITY 08/22/2009  . DIARRHEA 08/22/2009  . FULL INCONTINENCE OF FECES 08/22/2009    Past Surgical History:  Procedure Laterality Date  . abdominal laparascopic  92  . BLADDER SURGERY    . BREAST SURGERY  2003   bx lt-neg  . CERVICAL CONE BIOPSY    . CHOLECYSTECTOMY N/A 10/26/2014   Procedure: LAPAROSCOPIC CHOLECYSTECTOMY;  Surgeon: Coralie Keens, MD;  Location: [facility];  Service: General;  Laterality: N/A;  . COLONOSCOPY W/ BIOPSIES  multiple  . DIAGNOSTIC LAPAROSCOPY    . DILATION AND CURETTAGE OF UTERUS  07,11  . ESOPHAGOGASTRODUODENOSCOPY  multiple  . KNEE ARTHROSCOPY  07,10   left  . LAPAROSCOPIC VAGINAL HYSTERECTOMY WITH SALPINGO OOPHORECTOMY Bilateral 10/03/2015   Procedure: LAPAROSCOPIC ASSISTED VAGINAL HYSTERECTOMY WITH  SALPINGECTOMY;  Surgeon: Eldred Manges, MD;  Location: [facility];  Service: Gynecology;  Laterality: Bilateral;  . left finger surgery    . MASS EXCISION Left 12/24/2012   Procedure: LEFT EXCISION OF PALMAR MASS X TWO;  Surgeon: Roseanne Kaufman, MD;  Location: [place] [facility];  Service: Orthopedics;  Laterality: Left;    OB History    No data available       Home Medications    Prior to Admission medications   Medication Sig Start Date End Date Taking? Authorizing Provider  aspirin 325 MG tablet Take 325 mg by mouth daily.    Historical Provider,  MD  cholecalciferol (VITAMIN D) 1000 UNITS tablet Take 1,000 Units by mouth daily.    Historical Provider, MD  cyclobenzaprine (FLEXERIL) 5 MG tablet Take 1 tablet (5 mg total) by mouth at bedtime as needed for muscle spasms. 07/25/14   Dene Gentry, MD  hydrochlorothiazide 25 MG tablet Take 25 mg by mouth daily. Once daily    Historical Provider, MD  losartan (COZAAR) 50 MG tablet Take 50 mg by mouth daily.    Historical Provider, MD  Magnesium 500 MG CAPS Take 1 capsule by mouth at bedtime.    Historical Provider, MD  metFORMIN (GLUCOPHAGE) 500 MG tablet Take 1,000 mg by mouth 2 (two) times daily with a meal. 10/15/10   Historical Provider, MD  metoprolol succinate (TOPROL-XL) 50 MG 24 hr tablet Take 50 mg by mouth daily. Take with or immediately following a meal.    Historical Provider, MD  Omega-3 Fatty Acids (FISH OIL PO) Take 1 tablet by mouth daily.    Historical Provider, MD  pantoprazole (PROTONIX) 40 MG tablet Take 40 mg by mouth daily as needed (FOR UPSET STOMACH).    Historical Provider, MD  simvastatin (ZOCOR) 40 MG tablet Take 40 mg by mouth every evening.    Historical Provider, MD  traMADol (ULTRAM) 50 MG tablet Take 1 tablet (50 mg total) by mouth every 4 (four) hours as needed for severe pain (start tramadol if patient has taken oral food with nausea). 10/04/15   Earnstine Regal, PA-C  Turmeric 450 MG CAPS Take 900 mg by mouth daily.    Historical Provider, MD    Family History Family History  Problem Relation Age of Onset  . Heart attack Father   . Heart disease Father   . Coronary artery disease Mother   . Hypertension Mother   . Heart disease Mother   . Heart attack Mother   . Hypertension Sister   . Hypertension Brother   . Colon cancer Neg Hx     Social History Social History  Substance Use Topics  . Smoking status: Never Smoker  . Smokeless tobacco: Never Used  . Alcohol use 0.0 oz/week     Comment: social     Allergies   Ace inhibitors;  Oxycodone-acetaminophen; Percocet [oxycodone-acetaminophen]; Vicodin [hydrocodone-acetaminophen]; and Victoza [liraglutide]   Review of Systems Review of Systems  Constitutional: Positive for fever. Negative for chills and fatigue.  HENT: Negative for congestion and sore throat.   Eyes: Negative for visual disturbance.  Respiratory: Negative for cough, chest tightness and shortness of breath.   Cardiovascular: Negative for chest pain and palpitations.  Gastrointestinal: Negative for abdominal pain, blood in stool, diarrhea, nausea and vomiting.  Genitourinary: Negative for decreased urine volume, difficulty urinating, dysuria and flank pain.  Musculoskeletal: Negative for back pain and neck stiffness.  Skin: Negative for rash.  Neurological: Negative for light-headedness  and headaches.  Psychiatric/Behavioral: Negative for confusion.     Physical Exam Updated Vital Signs BP 131/77 (BP Location: Right Arm)   Pulse 116   Temp 102.4 F (39.1 C) (Oral)   Resp 22   Ht '5\' 7"'  (1.702 m)   Wt 250 lb (113.4 kg)   SpO2 95%   BMI 39.16 kg/m   Physical Exam  Constitutional: She is oriented to person, place, and time. She appears well-developed and well-nourished. No distress.  HENT:  Head: Normocephalic and atraumatic.  Nose: Nose normal.  Eyes: Conjunctivae and EOM are normal. Pupils are equal, round, and reactive to light. Right eye exhibits no discharge. Left eye exhibits no discharge. No scleral icterus.  Neck: Normal range of motion. Neck supple.  Cardiovascular: Regular rhythm.  Tachycardia present.  Exam reveals no gallop and no friction rub.   No murmur heard. Pulmonary/Chest: Effort normal and breath sounds normal. No stridor. No respiratory distress. She has no rales.  Abdominal: Soft. She exhibits no distension. There is no tenderness. There is CVA tenderness.  Musculoskeletal: She exhibits no edema or tenderness.  Neurological: She is alert and oriented to person, place,  and time.  Skin: Skin is warm and dry. No rash noted. She is not diaphoretic. No erythema.  Psychiatric: She has a normal mood and affect.  Vitals reviewed.    ED Treatments / Results  Labs (all labs ordered are listed, but only abnormal results are displayed) Labs Reviewed  COMPREHENSIVE METABOLIC PANEL - Abnormal; Notable for the following:       Result Value   Glucose, Bld 127 (*)    All other components within normal limits  CBC WITH DIFFERENTIAL/PLATELET - Abnormal; Notable for the following:    RBC 5.14 (*)    Neutro Abs 8.2 (*)    Lymphs Abs 0.4 (*)    All other components within normal limits  URINALYSIS, ROUTINE W REFLEX MICROSCOPIC (NOT AT [facility]) - Abnormal; Notable for the following:    Hgb urine dipstick SMALL (*)    Ketones, ur 15 (*)    Nitrite POSITIVE (*)    Leukocytes, UA SMALL (*)    All other components within normal limits  URINE MICROSCOPIC-ADD ON - Abnormal; Notable for the following:    Squamous Epithelial / LPF 0-5 (*)    Bacteria, UA FEW (*)    All other components within normal limits  I-STAT CG4 LACTIC ACID, ED - Abnormal; Notable for the following:    Lactic Acid, Venous 2.41 (*)    All other components within normal limits  I-STAT CG4 LACTIC ACID, ED - Abnormal; Notable for the following:    Lactic Acid, Venous 2.61 (*)    All other components within normal limits  CULTURE, BLOOD (ROUTINE X 2)  CULTURE, BLOOD (ROUTINE X 2)  URINE CULTURE  HCG, QUANTITATIVE, PREGNANCY    EKG  EKG Interpretation None       Radiology Dg Chest 2 View  Result Date: 01/30/2016 CLINICAL DATA:  Sudden onset fever today EXAM: CHEST  2 VIEW COMPARISON:  Oct 04, 2015 FINDINGS: The heart size and mediastinal contours are within normal limits. The aorta is tortuous. There is no focal infiltrate, pulmonary edema, or pleural effusion. Chronic change of left upper ribs are stable. IMPRESSION: No active cardiopulmonary disease. Electronically Signed   By: Abelardo Diesel  M.D.   On: 01/30/2016 21:39    Procedures Procedures (including critical care time)  Medications Ordered in ED Medications  ondansetron (ZOFRAN) injection  4 mg (0 mg Intravenous Hold 01/30/16 1945)  piperacillin-tazobactam (ZOSYN) IVPB 3.375 g (not administered)  vancomycin (VANCOCIN) IVPB 1000 mg/200 mL premix (not administered)  sodium chloride 0.9 % bolus 500 mL (not administered)  acetaminophen (TYLENOL) tablet 1,000 mg (not administered)    Or  acetaminophen (TYLENOL) suppository 650 mg (not administered)  ibuprofen (ADVIL,MOTRIN) 100 MG/5ML suspension 800 mg (not administered)  acetaminophen (TYLENOL) tablet 1,000 mg (1,000 mg Oral Given 01/30/16 1944)  sodium chloride 0.9 % bolus 1,000 mL (0 mLs Intravenous Stopped 01/30/16 2035)  piperacillin-tazobactam (ZOSYN) IVPB 3.375 g (0 g Intravenous Stopped 01/30/16 2046)  vancomycin (VANCOCIN) IVPB 1000 mg/200 mL premix (0 mg Intravenous Stopped 01/30/16 2152)  sodium chloride 0.9 % bolus 1,000 mL (1,000 mLs Intravenous Transfusing/Transfer 01/30/16 2307)     Initial Impression / Assessment and Plan / ED Course  I have reviewed the triage vital signs and the nursing notes.  Pertinent labs & imaging results that were available during my care of the patient were reviewed by me and considered in my medical decision making (see chart for details).  Clinical Course    Lactic acid elevated at 2.4. Code sepsis initiated and patient started on empiric antibiotics for unknown source. 1 L IV fluid started. Labs without leukocytosis or renal insufficiency. Urine consistent with urinary tract infection.   Symptomatically patient improved following IV fluids. Tachycardia also improved. However patient will require admission for continued management of urosepsis.  Appreciate hospitalist admission.  Final Clinical Impressions(s) / ED Diagnoses   Final diagnoses:  Sepsis, due to unspecified organism [facility])  Pyelonephritis    Disposition:  Admit  Condition: stable    Fatima Blank, MD 01/31/16 [phone]

## 2016-01-30 NOTE — ED Notes (Signed)
LAC panic results of 2.61 given to Dr. Leonette Monarch

## 2016-01-30 NOTE — ED Notes (Signed)
LAC panic results 2.41 Hand delivered to Dr. Leonette Monarch.

## 2016-01-30 NOTE — ED Notes (Signed)
Returned from xray

## 2016-01-30 NOTE — ED Triage Notes (Signed)
Patient states that she is having generalized aches, Patient found that she had fever with Headache. The patient reports that 102.8.

## 2016-01-30 NOTE — Progress Notes (Signed)
Pharmacy Antibiotic Note  AVABELLA STRASSER is a 60 y.o. female admitted on 01/30/2016 with sepsis.  Pharmacy has been consulted for vancomycin and zosyn dosing. Tmax is 102.4 but WBC is WNL. Lactic acid is elevated at 2.41 and Scr is WNL.   Plan: - Vancomycin 2gm IV x 1 then 1gm IV Q12H - Zosyn 3.375gm IV Q8H (4 hr inf) - F/u renal fxn, C&S, clinical status and trough at SS  Height: 5\' 7"  (170.2 cm) Weight: 250 lb (113.4 kg) IBW/kg (Calculated) : 61.6  Temp (24hrs), Avg:101.7 F (38.7 C), Min:101 F (38.3 C), Max:102.4 F (39.1 C)   Recent Labs Lab 01/30/16 1935 01/30/16 1943  WBC 8.7  --   CREATININE 0.74  --   LATICACIDVEN  --  2.41*    Estimated Creatinine Clearance: 100.8 mL/min (by C-G formula based on SCr of 0.8 mg/dL).    Allergies  Allergen Reactions  . Ace Inhibitors Other (See Comments)    coughing  . Oxycodone-Acetaminophen Nausea And Vomiting  . Percocet [Oxycodone-Acetaminophen] Nausea And Vomiting  . Vicodin [Hydrocodone-Acetaminophen] Nausea And Vomiting  . Victoza [Liraglutide] Other (See Comments)    pancreatitis    Antimicrobials this admission: Vanc 9/12>> Zosyn 9/12>>  Dose adjustments this admission: N/A  Microbiology results: Pending  Thank you for allowing pharmacy to be a part of this patient's care.  Reginia Battie, Rande Lawman 01/30/2016 9:18 PM

## 2016-01-30 NOTE — ED Notes (Signed)
Patient transported to X-ray 

## 2016-01-30 NOTE — Progress Notes (Signed)
60 yo F hx recurrent UTI presents with fever.  BP 126/82   Pulse 88   Temp 100.6 F (38.1 C) (Oral)   Resp 22   Ht 5\' 7"  (1.702 m)   Wt 113.4 kg (250 lb)   SpO2 94%   BMI 39.16 kg/m  CXR clear.  UA shows pyuria.  WBC normal, but febrile, tachycardic, lactate 2.4.  Got 1L and then vanc/Zosyn.  EDP will order ceftriaxone and repeat lactate.    To med surg, inpatient.

## 2016-01-30 NOTE — ED Notes (Signed)
Attempted report to floor.  

## 2016-01-31 ENCOUNTER — Encounter (HOSPITAL_COMMUNITY): Payer: Self-pay | Admitting: Internal Medicine

## 2016-01-31 ENCOUNTER — Inpatient Hospital Stay (HOSPITAL_COMMUNITY): Payer: 59

## 2016-01-31 DIAGNOSIS — E1159 Type 2 diabetes mellitus with other circulatory complications: Secondary | ICD-10-CM | POA: Diagnosis present

## 2016-01-31 DIAGNOSIS — N390 Urinary tract infection, site not specified: Secondary | ICD-10-CM | POA: Diagnosis present

## 2016-01-31 DIAGNOSIS — N12 Tubulo-interstitial nephritis, not specified as acute or chronic: Secondary | ICD-10-CM

## 2016-01-31 DIAGNOSIS — R7301 Impaired fasting glucose: Secondary | ICD-10-CM

## 2016-01-31 DIAGNOSIS — A419 Sepsis, unspecified organism: Principal | ICD-10-CM

## 2016-01-31 DIAGNOSIS — I471 Supraventricular tachycardia: Secondary | ICD-10-CM | POA: Insufficient documentation

## 2016-01-31 DIAGNOSIS — I10 Essential (primary) hypertension: Secondary | ICD-10-CM

## 2016-01-31 DIAGNOSIS — N10 Acute tubulo-interstitial nephritis: Secondary | ICD-10-CM

## 2016-01-31 DIAGNOSIS — E1121 Type 2 diabetes mellitus with diabetic nephropathy: Secondary | ICD-10-CM

## 2016-01-31 DIAGNOSIS — E119 Type 2 diabetes mellitus without complications: Secondary | ICD-10-CM

## 2016-01-31 LAB — BLOOD CULTURE ID PANEL (REFLEXED)
Acinetobacter baumannii: NOT DETECTED
CANDIDA GLABRATA: NOT DETECTED
CANDIDA KRUSEI: NOT DETECTED
CANDIDA TROPICALIS: NOT DETECTED
CARBAPENEM RESISTANCE: NOT DETECTED
Candida albicans: NOT DETECTED
Candida parapsilosis: NOT DETECTED
Enterobacter cloacae complex: NOT DETECTED
Enterobacteriaceae species: DETECTED — AB
Enterococcus species: NOT DETECTED
Escherichia coli: NOT DETECTED
Haemophilus influenzae: NOT DETECTED
KLEBSIELLA OXYTOCA: NOT DETECTED
Klebsiella pneumoniae: DETECTED — AB
Listeria monocytogenes: NOT DETECTED
Neisseria meningitidis: NOT DETECTED
PROTEUS SPECIES: NOT DETECTED
Pseudomonas aeruginosa: NOT DETECTED
SERRATIA MARCESCENS: NOT DETECTED
STAPHYLOCOCCUS AUREUS BCID: NOT DETECTED
STAPHYLOCOCCUS SPECIES: NOT DETECTED
STREPTOCOCCUS PNEUMONIAE: NOT DETECTED
Streptococcus agalactiae: NOT DETECTED
Streptococcus pyogenes: NOT DETECTED
Streptococcus species: NOT DETECTED

## 2016-01-31 LAB — BASIC METABOLIC PANEL
Anion gap: 8 (ref 5–15)
BUN: 9 mg/dL (ref 6–20)
CHLORIDE: 107 mmol/L (ref 101–111)
CO2: 23 mmol/L (ref 22–32)
Calcium: 8.1 mg/dL — ABNORMAL LOW (ref 8.9–10.3)
Creatinine, Ser: 0.91 mg/dL (ref 0.44–1.00)
GFR calc Af Amer: >60 mL/min (ref >60)
Glucose, Bld: 167 mg/dL — ABNORMAL HIGH (ref 65–99)
POTASSIUM: 3.3 mmol/L — AB (ref 3.5–5.1)
SODIUM: 138 mmol/L (ref 135–145)

## 2016-01-31 LAB — CBC
HCT: 40.6 % (ref 36.0–46.0)
HEMOGLOBIN: 12.9 g/dL (ref 12.0–15.0)
MCH: 27.3 pg (ref 26.0–34.0)
MCHC: 31.8 g/dL (ref 30.0–36.0)
MCV: 85.8 fL (ref 78.0–100.0)
PLATELETS: 153 10*3/uL (ref 150–400)
RBC: 4.73 MIL/uL (ref 3.87–5.11)
RDW: 12.7 % (ref 11.5–15.5)
WBC: 8.9 10*3/uL (ref 4.0–10.5)

## 2016-01-31 LAB — GLUCOSE, CAPILLARY
GLUCOSE-CAPILLARY: 165 mg/dL — AB (ref 65–99)
GLUCOSE-CAPILLARY: 131 mg/dL — AB (ref 65–99)
GLUCOSE-CAPILLARY: 130 mg/dL — AB (ref 65–99)
GLUCOSE-CAPILLARY: 123 mg/dL — AB (ref 65–99)

## 2016-01-31 LAB — TROPONIN I
Troponin I: 0.23 ng/mL (ref <0.03)
Troponin I: 0.58 ng/mL (ref <0.03)
Troponin I: 0.73 ng/mL (ref <0.03)

## 2016-01-31 LAB — MRSA PCR SCREENING: MRSA by PCR: NEGATIVE

## 2016-01-31 LAB — PHOSPHORUS: PHOSPHORUS: 1.2 mg/dL — AB (ref 2.5–4.6)

## 2016-01-31 LAB — MAGNESIUM: MAGNESIUM: 1.3 mg/dL — AB (ref 1.7–2.4)

## 2016-01-31 LAB — LACTIC ACID, PLASMA: LACTIC ACID, VENOUS: 2.3 mmol/L — AB (ref 0.5–1.9)

## 2016-01-31 MED ORDER — DILTIAZEM HCL 100 MG IV SOLR: 5.0000 mg/h | INTRAVENOUS | Status: DC

## 2016-01-31 MED ORDER — POTASSIUM PHOSPHATES 15 MMOLE/5ML IV SOLN
30.0000 mmol | Freq: Once | INTRAVENOUS | Status: DC
  Administered 2016-01-31: 30 mmol via INTRAVENOUS
  Filled 2016-01-31: qty 10

## 2016-01-31 MED ORDER — METOPROLOL TARTRATE 5 MG/5ML IV SOLN
INTRAVENOUS | Status: AC
  Filled 2016-01-31: qty 5

## 2016-01-31 MED ORDER — INSULIN ASPART 100 UNIT/ML ~~LOC~~ SOLN
1.0000 [IU] | SUBCUTANEOUS | Status: DC
  Administered 2016-01-31: 2 [IU] via SUBCUTANEOUS
  Administered 2016-02-01: 3 [IU] via SUBCUTANEOUS
  Administered 2016-02-01 (×2): 2 [IU] via SUBCUTANEOUS

## 2016-01-31 MED ORDER — MAGNESIUM SULFATE 2 GM/50ML IV SOLN
2.0000 g | Freq: Once | INTRAVENOUS | Status: AC
  Administered 2016-01-31: 2 g via INTRAVENOUS
  Filled 2016-01-31: qty 50

## 2016-01-31 MED ORDER — METOPROLOL TARTRATE 5 MG/5ML IV SOLN
5.0000 mg | Freq: Once | INTRAVENOUS | Status: AC
  Administered 2016-01-31: 5 mg via INTRAVENOUS

## 2016-01-31 MED ORDER — DILTIAZEM HCL-DEXTROSE 100-5 MG/100ML-% IV SOLN (PREMIX)
INTRAVENOUS | Status: AC
  Filled 2016-01-31: qty 100

## 2016-01-31 MED ORDER — DILTIAZEM LOAD VIA INFUSION
10.0000 mg | Freq: Once | INTRAVENOUS | Status: AC
  Administered 2016-01-31: 10 mg via INTRAVENOUS
  Filled 2016-01-31: qty 10

## 2016-01-31 MED ORDER — ADENOSINE 6 MG/2ML IV SOLN
INTRAVENOUS | Status: AC
  Administered 2016-01-31: 6 mg
  Filled 2016-01-31: qty 2

## 2016-01-31 MED ORDER — ACETAMINOPHEN 500 MG PO TABS
ORAL_TABLET | ORAL | Status: AC
  Filled 2016-01-31: qty 2

## 2016-01-31 MED ORDER — DILTIAZEM HCL-DEXTROSE 100-5 MG/100ML-% IV SOLN (PREMIX)
5.0000 mg/h | INTRAVENOUS | Status: DC
  Administered 2016-01-31: 5 mg/h via INTRAVENOUS

## 2016-01-31 MED ORDER — DILTIAZEM LOAD VIA INFUSION: 10.0000 mg | Freq: Once | INTRAVENOUS | Status: DC

## 2016-01-31 MED ORDER — ACETAMINOPHEN 650 MG RE SUPP: 650.0000 mg | RECTAL | Status: DC | PRN

## 2016-01-31 MED ORDER — SODIUM CHLORIDE 0.9 % IV BOLUS (SEPSIS): 500.0000 mL | Freq: Once | INTRAVENOUS | Status: DC

## 2016-01-31 MED ORDER — ASPIRIN 81 MG PO CHEW
81.0000 mg | CHEWABLE_TABLET | Freq: Every day | ORAL | Status: DC
  Administered 2016-01-31 – 2016-02-02 (×3): 81 mg via ORAL
  Filled 2016-01-31 (×3): qty 1

## 2016-01-31 MED ORDER — SODIUM CHLORIDE 0.9 % IV BOLUS (SEPSIS)
1500.0000 mL | Freq: Once | INTRAVENOUS | Status: AC
  Administered 2016-01-31: 1500 mL via INTRAVENOUS

## 2016-01-31 MED ORDER — PANTOPRAZOLE SODIUM 40 MG PO TBEC
40.0000 mg | DELAYED_RELEASE_TABLET | Freq: Every day | ORAL | Status: DC
  Administered 2016-01-31 – 2016-02-02 (×3): 40 mg via ORAL
  Filled 2016-01-31 (×3): qty 1

## 2016-01-31 MED ORDER — IBUPROFEN 200 MG PO TABS
400.0000 mg | ORAL_TABLET | Freq: Four times a day (QID) | ORAL | Status: DC | PRN
  Administered 2016-01-31 – 2016-02-01 (×3): 400 mg via ORAL
  Filled 2016-01-31 (×3): qty 2

## 2016-01-31 MED ORDER — SALINE SPRAY 0.65 % NA SOLN
1.0000 | NASAL | Status: DC | PRN
  Filled 2016-01-31: qty 44

## 2016-01-31 MED ORDER — ACETAMINOPHEN 500 MG PO TABS
1000.0000 mg | ORAL_TABLET | ORAL | Status: DC | PRN
  Administered 2016-01-31 – 2016-02-02 (×6): 1000 mg via ORAL
  Filled 2016-01-31 (×5): qty 2

## 2016-01-31 MED ORDER — DEXTROSE 5 % IV SOLN
2.0000 g | INTRAVENOUS | Status: DC
  Administered 2016-01-31 – 2016-02-02 (×3): 2 g via INTRAVENOUS
  Filled 2016-01-31 (×4): qty 2

## 2016-01-31 MED ORDER — POTASSIUM CHLORIDE CRYS ER 20 MEQ PO TBCR
40.0000 meq | EXTENDED_RELEASE_TABLET | Freq: Once | ORAL | Status: AC
  Administered 2016-01-31: 40 meq via ORAL
  Filled 2016-01-31: qty 2

## 2016-01-31 MED ORDER — ADENOSINE 6 MG/2ML IV SOLN
INTRAVENOUS | Status: AC
  Filled 2016-01-31: qty 2

## 2016-01-31 MED ORDER — ADENOSINE 6 MG/2ML IV SOLN
INTRAVENOUS | Status: AC
  Filled 2016-01-31: qty 4

## 2016-01-31 MED ORDER — IBUPROFEN 100 MG/5ML PO SUSP
800.0000 mg | Freq: Once | ORAL | Status: AC
  Administered 2016-01-31: 800 mg via ORAL
  Filled 2016-01-31: qty 40

## 2016-01-31 NOTE — Progress Notes (Signed)
Patient is shivering uncontrollably, oral temp of 102F, 1000mg  of tylenol received, spoke with on-call critical care McQuaid MD, he stated to continue to monitor mental status and vitals for any changes.  Cyndia Bent

## 2016-01-31 NOTE — Consult Note (Signed)
PULMONARY / CRITICAL CARE MEDICINE   Name: Alexandra Walker MRN: [id number] DOB: 1955/10/03    ADMISSION DATE:  01/30/2016 CONSULTATION DATE:  9/13  REFERRING MD:  Dr. Loleta Books TR[facility]  CHIEF COMPLAINT:  fevers  HISTORY OF PRESENT ILLNESS:   60 year old female with PMH as below, which is significant for DM, HTN, Obesity, PAC and PJCs, and Anticardiolipin syndrome. She recently had some postmenopausal bleeding deemed to be the result of low grade squamous intraepithelial dysplasia. She underwent elective hysterectomy for this 09/2015. Since that time she has had several urinary tract infections with only symptoms being fevers. 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 There improved after taking some tylenol for fevers. Fevers worsened and she again developed rigors associated with muscle aching prompting her to present to [facility] ED. In ED urinalysis concerning for urinary tract infection despite no elevation in WBC. Fevers as high as 105F. She was transferred to [facility] for admission to medical floor where she was found to be in SVT. PCCM consulted for further evaluation.   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[place]); Arthritis; Chronic depressive disorder; Chronic edema; Circadian rhythm sleep disorder; Diabetes ([place]); Dysrhythmia; Ganglion of knee; GERD (gastroesophageal reflux disease); Glucose intolerance (impaired glucose tolerance); HLA B27 (HLA B27 positive); Hyperlipidemia; Hypertension; IBS (irritable bowel syndrome); Ischemic colitis ([place]); Kyphosis; Lateral epicondylitis of right elbow; Lymphadenopathy; Migraine headache; Morbid obesity ([place]); Onycholysis; PAC (premature atrial contraction); Paronychia; PMS (premenstrual syndrome); PONV (postoperative nausea and vomiting); Sacroiliitis ([place]); Seasonal allergies; Trigger ring finger of left hand; and Vitamin D deficiency.  PAST  SURGICAL HISTORY: She  has a past surgical history that includes abdominal laparascopic (92); left finger surgery; Colonoscopy w/ biopsies (multiple); Esophagogastroduodenoscopy (multiple); Knee arthroscopy (07,10); Dilation and curettage of uterus (07,11); Breast surgery (2003); Cervical cone biopsy; Mass excision (Left, 12/24/2012); Diagnostic laparoscopy; Cholecystectomy (N/A, 10/26/2014); Bladder surgery; and Laparoscopic vaginal hysterectomy with salpingo oophorectomy (Bilateral, 10/03/2015).  Allergies  Allergen Reactions  . Ace Inhibitors Other (See Comments)    coughing  . Oxycodone-Acetaminophen Nausea And Vomiting  . Percocet [Oxycodone-Acetaminophen] Nausea And Vomiting  . Vicodin [Hydrocodone-Acetaminophen] Nausea And Vomiting  . Victoza [Liraglutide] Other (See Comments)    pancreatitis    No current facility-administered medications on file prior to encounter.    Current Outpatient Prescriptions on File Prior to Encounter  Medication Sig  . aspirin 325 MG tablet Take 325 mg by mouth daily.  . cholecalciferol (VITAMIN D) 1000 UNITS tablet Take 1,000 Units by mouth daily.  . cyclobenzaprine (FLEXERIL) 5 MG tablet Take 1 tablet (5 mg total) by mouth at bedtime as needed for muscle spasms.  . hydrochlorothiazide 25 MG tablet Take 25 mg by mouth daily. Once daily  . losartan (COZAAR) 50 MG tablet Take 50 mg by mouth daily.  . Magnesium 500 MG CAPS Take 1 capsule by mouth at bedtime.  . metFORMIN (GLUCOPHAGE) 500 MG tablet Take 1,000 mg by mouth 2 (two) times daily with a meal.  . metoprolol succinate (TOPROL-XL) 50 MG 24 hr tablet Take 50 mg by mouth daily. Take with or immediately following a meal.  . Omega-3 Fatty Acids (FISH OIL PO) Take 1 tablet by mouth daily.  . pantoprazole (PROTONIX) 40 MG tablet Take 40 mg by mouth daily as needed (FOR UPSET STOMACH).  Marland Kitchen simvastatin (ZOCOR) 40 MG tablet Take 40 mg by mouth every evening.  Marland Kitchen  traMADol (ULTRAM) 50 MG tablet Take 1 tablet (50 mg  total) by mouth every 4 (four) hours as needed for severe pain (start tramadol if patient has taken oral food with nausea).  . Turmeric 450 MG CAPS Take 900 mg by mouth daily.    FAMILY HISTORY:  Her indicated that her mother is alive. She indicated that her father is deceased. She indicated that the status of her sister is unknown. She indicated that the status of her brother is unknown. She indicated that the status of her neg hx is unknown.    SOCIAL HISTORY: She  reports that she has never smoked. She has never used smokeless tobacco. She reports that she drinks alcohol. She reports that she does not use drugs.  REVIEW OF SYSTEMS:   Bolds are positive  Constitutional: weight loss, gain, night sweats, Fevers, chills, fatigue .  HEENT: headaches, Sore throat, sneezing, nasal congestion, post nasal drip, Difficulty swallowing, Tooth/dental problems, visual complaints visual changes, ear ache CV:  chest pain, radiates: ,Orthopnea, PND, swelling in lower extremities, dizziness, palpitations, syncope.  GI  heartburn, indigestion, abdominal pain, nausea, vomiting, diarrhea, change in bowel habits, loss of appetite, bloody stools.  Resp: cough, productive: , hemoptysis, dyspnea, chest pain, pleuritic > L chest into back, reproducible with palpation and deep breathing.  Skin: rash or itching or icterus GU: dysuria, change in color of urine, urgency or frequency. flank pain, hematuria  MS: joint pain or swelling. decreased range of motion  Psych: change in mood or affect. depression or anxiety.  Neuro: difficulty with speech, weakness, numbness, ataxia   SUBJECTIVE:   VITAL SIGNS: BP (!) 126/46   Pulse (!) 191   Temp 100 F (37.8 C) (Oral)   Resp 17   Ht '5\' 7"'  (1.702 m)   Wt 113.4 kg (250 lb)   SpO2 94%   BMI 39.16 kg/m   HEMODYNAMICS:    VENTILATOR SETTINGS:    INTAKE / OUTPUT: No intake/output data recorded.  PHYSICAL EXAMINATION: General:  Obese female with rigors Neuro:   Alert, oriented, non-focal HEENT:  [place]/AT, PERRL Cardiovascular:  Tachy to 190s, regular Lungs:  Clear, normal WOB Abdomen:  Soft, non-tender, non-distended. +BS normoactive Musculoskeletal:  Mild lower extremity edema baseline. Pleuritic chest pain Skin:  Grossly intact.   LABS:  BMET  Recent Labs Lab 01/30/16 1935  NA 136  K 3.5  CL 102  CO2 25  BUN 13  CREATININE 0.74  GLUCOSE 127*    Electrolytes  Recent Labs Lab 01/30/16 1935  CALCIUM 9.3    CBC  Recent Labs Lab 01/30/16 1935  WBC 8.7  HGB 14.5  HCT 43.0  PLT 207    Coag's No results for input(s): APTT, INR in the last 168 hours.  Sepsis Markers  Recent Labs Lab 01/30/16 1943 01/30/16 2239  LATICACIDVEN 2.41* 2.61*    ABG No results for input(s): PHART, PCO2ART, PO2ART in the last 168 hours.  Liver Enzymes  Recent Labs Lab 01/30/16 1935  AST 30  ALT 30  ALKPHOS 76  BILITOT 0.8  ALBUMIN 4.0    Cardiac Enzymes No results for input(s): TROPONINI, PROBNP in the last 168 hours.  Glucose No results for input(s): GLUCAP in the last 168 hours.  Imaging Dg Chest 2 View  Result Date: 01/30/2016 CLINICAL DATA:  Sudden onset fever today EXAM: CHEST  2 VIEW COMPARISON:  Oct 04, 2015 FINDINGS: The heart size and mediastinal contours are within normal limits. The aorta is tortuous. There is  no focal infiltrate, pulmonary edema, or pleural effusion. Chronic change of left upper ribs are stable. IMPRESSION: No active cardiopulmonary disease. Electronically Signed   By: Abelardo Diesel M.D.   On: 01/30/2016 21:39    STUDIES:    CULTURES: BCx2 9/12 >  Urine 9/12 >  ANTIBIOTICS: Zosyn 9/12 > Vancomycin  9/12 >  SIGNIFICANT EVENTS: 9/12 admit for UTI 9/13 SVT, high fevers, to ICU  LINES/TUBES:   DISCUSSION: 60 year old female with recent UTIs s/p hysterectomy admitted for urosepsis 9/13. High fevers developed SVT. Transfer to ICU for close monitoring. Hope will convert with IVF, temp  control, and metop. May need adenosine.   ASSESSMENT / PLAN:  PULMONARY A: No acute issues  P:   Supplemental O2 IS  CARDIOVASCULAR A:  Supraventricular tachycardia > suspect in setting of fevers. Elevated lactic H/o HTN, HLD, PAC/PJC (controlled on toprol) Extensive cardiac workup in past negative  P:  Telemetry monitoring Will try 41m metoprolol now that BP improved to 125/57 If not converted with metop/fever control/bolus will consider adenosine Cycle troponin Ensure lactic clearing Continue home simvastatin, metoprolol, cozaar, HCTZ, ASA  RENAL A:   No acute issues  P:   Repeat BMP in AM  GASTROINTESTINAL A:   No acute issues  P:   NPO Pepcid  HEMATOLOGIC A:   No acute issues  P:  Follow CBC SCDs and early ambulation for VTE ppx  INFECTIOUS A:   Sepsis secondary to urinary tract infection  P:   ABX as above, treat as complicated UTI as this is 3rd in past few months Follow cultures Trend PCT  ENDOCRINE A:   DM  P:   Hold metformin CBG monitoring and SSI  NEUROLOGIC A:   No acute issues  P:   Monitor   FAMILY  - Updates:  Patient updated extensively at bedside  - Inter-disciplinary family meet or Palliative Care meeting due by:  9/20   PGeorgann Housekeeper AGACNP-BC [facility]/Critical [facility] Pager [phone] [phone]  01/31/2016 3:40 AM

## 2016-01-31 NOTE — Progress Notes (Signed)
PCCM INTERVAL PROGRESS NOTE  SVT continued despite fever improved, IVF in, and total of 10mg  metoprolol. Gave one dose IV adenosine 6mg . HR slowed and I was unable to appreciate p-waves. HR picked back up to 170. With potential IVCD.  Of note echo from May 2017 normal  Presumably this is AF RVR Will order diltiazem infusion with 10mg  bolus due to borderline BP (SBP 110). Troponin pending If BP unable to tolerated dilt, would consider amiodarone Consider formal cardiology consultation by day team. She is followed by Dr. Rich Reining, AGACNP-BC [facility] Pulmonology/Critical Care Pager [phone] or [phone]  01/31/2016 4:39 AM

## 2016-01-31 NOTE — H&P (Signed)
History and Physical    Alexandra Walker HRC:[id number] DOB: 30-Jan-1956 DOA: 01/30/2016  PCP: Garlan Fillers, MD  Patient coming from: [facility] [place].  Chief Complaint: Fever and chills.  HPI: Alexandra Walker is a 60 y.o. 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 In the ER patient was found to be tachycardic with temperatures around 102F. UA was consistent with UTI. Blood cultures were sent and patient was placed on sepsis protocol and started on empiric antibiotics and fluid bolus. After the fluid bolus patient's heart rate improved. Patient was transferred to [facility] for further management. After reaching [facility] patient went into SVT and temperatures increased to 105 degrees Fahrenheit. Critical care was already on the bedside by the time I examined and patient is being transferred to ICU for further management. On exam patient denies any chest pain shortness of breath abdominal pain nausea vomiting or diarrhea.  ED Course: See history of presenting illness.  Review of Systems: As per HPI, rest all negative.   Past Medical History:  Diagnosis Date  . 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 Arthritis   . Chronic depressive disorder   . Chronic edema    bilateral ankles  . Circadian rhythm sleep disorder   . Diabetes ([place])   . Dysrhythmia    PAC, PJC, managed well with toprolol  . Ganglion of knee    left  . GERD (gastroesophageal reflux disease)    rare  . Glucose intolerance (impaired glucose tolerance)   . HLA B27 (HLA B27 positive)   . Hyperlipidemia   . Hypertension   . IBS (irritable bowel syndrome)   . Ischemic colitis ([place])   . Kyphosis   . Lateral epicondylitis of right elbow   . Lymphadenopathy   . Migraine headache    history of  . Morbid obesity ([place])   . Onycholysis     . PAC (premature atrial contraction)   . Paronychia   . PMS (premenstrual syndrome)   . PONV (postoperative nausea and vomiting)   . Sacroiliitis ([place])   . Seasonal allergies   . Trigger ring finger of left hand   . Vitamin D deficiency     Past Surgical History:  Procedure Laterality Date  . abdominal laparascopic  92  . BLADDER SURGERY    . BREAST SURGERY  2003   bx lt-neg  . CERVICAL CONE BIOPSY    . CHOLECYSTECTOMY N/A 10/26/2014   Procedure: LAPAROSCOPIC CHOLECYSTECTOMY;  Surgeon: Coralie Keens, MD;  Location: [facility];  Service: General;  Laterality: N/A;  . COLONOSCOPY W/ BIOPSIES  multiple  . DIAGNOSTIC LAPAROSCOPY    . DILATION AND CURETTAGE OF UTERUS  07,11  . ESOPHAGOGASTRODUODENOSCOPY  multiple  . KNEE ARTHROSCOPY  07,10   left  . LAPAROSCOPIC VAGINAL HYSTERECTOMY WITH SALPINGO OOPHORECTOMY Bilateral 10/03/2015   Procedure: LAPAROSCOPIC ASSISTED VAGINAL HYSTERECTOMY WITH SALPINGECTOMY;  Surgeon: Eldred Manges, MD;  Location: [facility];  Service: Gynecology;  Laterality: Bilateral;  . left finger surgery    . MASS EXCISION Left 12/24/2012   Procedure: LEFT EXCISION OF PALMAR MASS X TWO;  Surgeon: Roseanne Kaufman, MD;  Location: [facility];  Service: Orthopedics;  Laterality: Left;     reports that she has never smoked. She has never used smokeless tobacco. She reports that she drinks alcohol.  She reports that she does not use drugs.  Allergies  Allergen Reactions  . Ace Inhibitors Other (See Comments)    coughing  . Oxycodone-Acetaminophen Nausea And Vomiting  . Percocet [Oxycodone-Acetaminophen] Nausea And Vomiting  . Vicodin [Hydrocodone-Acetaminophen] Nausea And Vomiting  . Victoza [Liraglutide] Other (See Comments)    pancreatitis    Family History  Problem Relation Age of Onset  . Heart attack Father   . Heart disease Father   . Coronary artery disease Mother   . Hypertension Mother   . Heart disease Mother   . Heart  attack Mother   . Hypertension Sister   . Hypertension Brother   . Colon cancer Neg Hx     Prior to Admission medications   Medication Sig Start Date End Date Taking? Authorizing Provider  aspirin 325 MG tablet Take 325 mg by mouth daily.    Historical Provider, MD  cholecalciferol (VITAMIN D) 1000 UNITS tablet Take 1,000 Units by mouth daily.    Historical Provider, MD  cyclobenzaprine (FLEXERIL) 5 MG tablet Take 1 tablet (5 mg total) by mouth at bedtime as needed for muscle spasms. 07/25/14   Dene Gentry, MD  hydrochlorothiazide 25 MG tablet Take 25 mg by mouth daily. Once daily    Historical Provider, MD  losartan (COZAAR) 50 MG tablet Take 50 mg by mouth daily.    Historical Provider, MD  Magnesium 500 MG CAPS Take 1 capsule by mouth at bedtime.    Historical Provider, MD  metFORMIN (GLUCOPHAGE) 500 MG tablet Take 1,000 mg by mouth 2 (two) times daily with a meal. 10/15/10   Historical Provider, MD  metoprolol succinate (TOPROL-XL) 50 MG 24 hr tablet Take 50 mg by mouth daily. Take with or immediately following a meal.    Historical Provider, MD  Omega-3 Fatty Acids (FISH OIL PO) Take 1 tablet by mouth daily.    Historical Provider, MD  pantoprazole (PROTONIX) 40 MG tablet Take 40 mg by mouth daily as needed (FOR UPSET STOMACH).    Historical Provider, MD  simvastatin (ZOCOR) 40 MG tablet Take 40 mg by mouth every evening.    Historical Provider, MD  traMADol (ULTRAM) 50 MG tablet Take 1 tablet (50 mg total) by mouth every 4 (four) hours as needed for severe pain (start tramadol if patient has taken oral food with nausea). 10/04/15   Earnstine Regal, PA-C  Turmeric 450 MG CAPS Take 900 mg by mouth daily.    Historical Provider, MD    Physical Exam: Vitals:   01/30/16 2230 01/30/16 2300 01/31/16 0009 01/31/16 0229  BP: 120/78 123/81 (!) 102/49 (!) 126/46  Pulse: 84 88 84 (!) 191  Resp: '21 22 17   ' Temp:  99.3 F (37.4 C) 100 F (37.8 C)   TempSrc:  Oral Oral   SpO2: 96% 94% 98% 94%    Weight:      Height:          Constitutional: Not in distress. Vitals:   01/30/16 2230 01/30/16 2300 01/31/16 0009 01/31/16 0229  BP: 120/78 123/81 (!) 102/49 (!) 126/46  Pulse: 84 88 84 (!) 191  Resp: '21 22 17   ' Temp:  99.3 F (37.4 C) 100 F (37.8 C)   TempSrc:  Oral Oral   SpO2: 96% 94% 98% 94%  Weight:      Height:       Eyes: Anicteric no pallor. ENMT: No discharge from the ears eyes nose or mouth. Neck: No mass felt. No neck  rigidity. Respiratory: No rhonchi or crepitations. Cardiovascular: S1-S2 tachycardic. Abdomen: Soft nontender bowel sounds present. Musculoskeletal: No edema. Skin: No rash. Neurologic: Alert awake oriented to time place and person. Moves all extremities. Psychiatric: Appears normal.   Labs on Admission: I have personally reviewed following labs and imaging studies  CBC:  Recent Labs Lab 01/30/16 1935  WBC 8.7  NEUTROABS 8.2*  HGB 14.5  HCT 43.0  MCV 83.7  PLT 415   Basic Metabolic Panel:  Recent Labs Lab 01/30/16 1935  NA 136  K 3.5  CL 102  CO2 25  GLUCOSE 127*  BUN 13  CREATININE 0.74  CALCIUM 9.3   GFR: Estimated Creatinine Clearance: 100.8 mL/min (by C-G formula based on SCr of 0.74 mg/dL). Liver Function Tests:  Recent Labs Lab 01/30/16 1935  AST 30  ALT 30  ALKPHOS 76  BILITOT 0.8  PROT 7.0  ALBUMIN 4.0   No results for input(s): LIPASE, AMYLASE in the last 168 hours. No results for input(s): AMMONIA in the last 168 hours. Coagulation Profile: No results for input(s): INR, PROTIME in the last 168 hours. Cardiac Enzymes: No results for input(s): CKTOTAL, CKMB, CKMBINDEX, TROPONINI in the last 168 hours. BNP (last 3 results) No results for input(s): PROBNP in the last 8760 hours. HbA1C: No results for input(s): HGBA1C in the last 72 hours. CBG: No results for input(s): GLUCAP in the last 168 hours. Lipid Profile: No results for input(s): CHOL, HDL, LDLCALC, TRIG, CHOLHDL, LDLDIRECT in the last 72  hours. Thyroid Function Tests: No results for input(s): TSH, T4TOTAL, FREET4, T3FREE, THYROIDAB in the last 72 hours. Anemia Panel: No results for input(s): VITAMINB12, FOLATE, FERRITIN, TIBC, IRON, RETICCTPCT in the last 72 hours. Urine analysis:    Component Value Date/Time   COLORURINE YELLOW 01/30/2016 2040   APPEARANCEUR CLEAR 01/30/2016 2040   LABSPEC 1.021 01/30/2016 2040   PHURINE 6.0 01/30/2016 2040   GLUCOSEU NEGATIVE 01/30/2016 2040   HGBUR SMALL (A) 01/30/2016 2040   [place] NEGATIVE 01/30/2016 2040   KETONESUR 15 (A) 01/30/2016 2040   PROTEINUR NEGATIVE 01/30/2016 2040   NITRITE POSITIVE (A) 01/30/2016 2040   LEUKOCYTESUR SMALL (A) 01/30/2016 2040   Sepsis Labs: '@LABRCNTIP' (procalcitonin:4,lacticidven:4) ) Recent Results (from the past 240 hour(s))  Culture, blood (Routine x 2)     Status: None (Preliminary result)   Collection Time: 01/30/16  7:35 PM  Result Value Ref Range Status   Specimen Description BLOOD RIGHT ANTECUBITAL  Final   Special Requests BOTTLES DRAWN AEROBIC AND ANAEROBIC 5CC EACH  Final   Culture PENDING  Incomplete   Report Status PENDING  Incomplete  Culture, blood (Routine x 2)     Status: None (Preliminary result)   Collection Time: 01/30/16  7:40 PM  Result Value Ref Range Status   Specimen Description BLOOD LEFT WRIST  Final   Special Requests BOTTLES DRAWN AEROBIC AND ANAEROBIC 5CC EACH  Final   Culture PENDING  Incomplete   Report Status PENDING  Incomplete     Radiological Exams on Admission: Dg Chest 2 View  Result Date: 01/30/2016 CLINICAL DATA:  Sudden onset fever today EXAM: CHEST  2 VIEW COMPARISON:  Oct 04, 2015 FINDINGS: The heart size and mediastinal contours are within normal limits. The aorta is tortuous. There is no focal infiltrate, pulmonary edema, or pleural effusion. Chronic change of left upper ribs are stable. IMPRESSION: No active cardiopulmonary disease. Electronically Signed   By: Abelardo Diesel M.D.   On:  01/30/2016 21:39  EKG: Independently reviewed. SVT.  Assessment/Plan Principal Problem:   Sepsis ([place]) Active Problems:   SVT (supraventricular tachycardia) (HCC)   UTI (lower urinary tract infection)   Essential hypertension   Diabetes mellitus type 2, controlled ([place])    1. Sepsis secondary to UTI. 2. SVT probably precipitated by sepsis. 3. History of hypertension. 4. History of diabetes mellitus type 2.  Plan - I have discussed with pulmonary critical care PA Mr. Hoffman who is transferring patient to ICU for further management and probably will need Adenosine for SVT and continue with empiric antibiotics for sepsis. Further management will be according to critical care.   DVT prophylaxis: Lovenox. Code Status: Full code.  Family Communication: Discussed with patient.  Disposition Plan: Home.  Consults called: Critical care already at the patient's bedside.  Admission status: Inpatient.    Rise Patience MD [facility] Pager [phone].  If 7PM-7AM, please contact night-coverage [email or web address] Password [email or web address]  01/31/2016, 2:59 AM

## 2016-01-31 NOTE — Progress Notes (Signed)
eLink Physician-Brief Progress Note Patient Name: Alexandra Walker DOB: November 21, 1955 MRN: [id number]   Date of Service  01/31/2016  HPI/Events of Note  Fever Back pain Requesting ibuprofen Renal function ok  eICU Interventions  Ibuprofen prn     Intervention Category Minor Interventions: Routine modifications to care plan (e.g. PRN medications for pain, fever)  Simonne Maffucci 01/31/2016, 7:42 PM

## 2016-01-31 NOTE — Significant Event (Signed)
Rapid Response Event Note  Overview: Time Called: 0205 Arrival Time: 0207 Event Type: Cardiac, Other (Comment) (Fever 105.4)  Initial Focused Assessment: Called by Alexandra Glad, RN that this pt newly arrived from [facility]  Septic and with UTI  had fever of 105.4 and tachycardic  With HR 180s.  Upon arrival to room pt lying in bed, alert, oriented times 4, skin flushed and hot to touch, diaphoretic. Cardiac monitor SVT with HR 180-200 BP 133/65.  Pt states that since her hysterectomy in May of this year she has had several UTI's always associated with fever.  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  She went home , took tylenol but her fever rose to 102.8 and she developed rigors and muscle aches so she sought medical care at [facility].  She was transferred to [facility] 5 w10 for continued  sepsis workup. She developed  A rapid HR at 0146 hrs associated with cough and vomiting x 2.  She c/o nasal congestion and pleuritic CP rated 5/10 worsening with inspiration and reproduced by palpation.  Bilateral BS clear. O2 sats 98% with RR 26.  Pt admits to sense of impending doom and being very frightened   Interventions: 12 lead EKG obtained confirming pt in SVT ,tylenol 1000 mg given po, and  ice packs applied to groin and bilateral axilla,  NS 500 cc bolus given.  Attempts to vasovagal pt with breath holding, bearing down, carotid massage where unsuccessful in terminating SVT.  On call hospitalist NP and MDs  paged with no return response so I paged Alexandra Housekeeper, NP who arrived at bedside at 0235.  An additional  NS bolus 100cc was given per NP.   At 0241  BP 104/44   with SVT 192.  Pt to be  transferred to ICU  for closer observation.  Family notified by 5W staff, report given  By Alexandra Glad, RN    Moral support given to patient with transport via bed and cardiac monitoring to 2H14. Will continue to follow as needed.  Plan of Care (if not  transferred):  Event Summary: Name of Physician Notified: Alexandra Housekeeper, NP at [phone]    at    Outcome: Transferred (Comment) [id number])  Event End Time: 0300  Alexandra Walker

## 2016-01-31 NOTE — H&P (Signed)
ATTENDING NOTE: I have personally reviewed patient's available data, including medical history, events of note, physical examination and test results as part of my evaluation. I have discussed with resident/NP and other careteam providers such as pharmacist, RN and RRT & co-ordinated with consultants.   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 She was transferred from [facility] to the floor but due to SVT, transferred to the ICU. She was given adenosine, the response to metoprolol and placed on a Cardizem drip with conversion to sinus rhythm. I have reviewed all EKG strips-showing SVT with wide complex tachycardia and subsequent sinus rhythm with conduction block. She denies significant back pain  On exam-defervesced, clear lungs, no lymphadenopathy, soft nontender abdomen, no CV angle tenderness, no edema or calf tenderness  Labs reviewed-lactate stable from a peak of 2.6, low positive troponin 0.23, hypokalemia and hypophosphatemia and hypomagnesemia  Impression/plan-  Severe sepsis-likely source being UTI Will need renal ultrasound-note that 2016 showed renal calculus, will look for hydronephrosis and other pelvic pathology-this is her third UTI since hysterectomy  Empiric Zosyn until cultures obtained  SVT, troponin elevated- now back in normal sinus rhythm, can transition off Cardizem and back to metoprolol as blood pressure tolerates Will need cardiology consult to be called in a.m.  If stays in sinus rhythm off Cardizem, can be transferred to telemetry and back to [facility]ad  Electrolytes including magnesium, potassium and phosphorus will be aggressively repleted Rest per NP/medical resident whose note is outlined above and that I agree with and edited in full.   Kara Mead MD. [place] Flood. [facility] Pager [phone] If no response call [phone]  [phone]   01/31/2016

## 2016-01-31 NOTE — Progress Notes (Signed)
Patient requesting more blankets. NT took temperature, and it read 98.7.  Blankets given to her.  Rechecked Temp at 0215 and it was 104. Paged Oncall.  Order given for tylenol 1 gm and ibuprofen 800mg  suspension. Temperature retaken and it was 105.  I paged Rapid Response, who immediately came to the floor. At this time Ice packs were placed on patient. At 9 Paged Dr Nichola Sizer and Dr. Loleta Books was paged.  After no return call drs were repaged. At 2:28 her Heart rate is 181, and Georgann Housekeeper, critical care NP was called to the floor. At 0233 heart is is 188.  Temp is 104.3. Saline bolus started, 02 placed on patient. Tylenol 1 gm was given PO to patient.  Georgann Housekeeper arrived at 47. Vagal manuver attempted by patient without effect. 800 mg of Ibuprofen arrived from pharmacy and was given.  Hoffman ordered patient be transferred to [facility]. Room assigned 14.  Patients sister Sharyn Lull was phoned, and she is on her way.   Report called to [place] on [place]. Patient transported on tele. To Room 14 on [facility]Heart.

## 2016-01-31 NOTE — Progress Notes (Signed)
Patient is now febrile at 104, tachycardic, . Paged on call.

## 2016-01-31 NOTE — Progress Notes (Signed)
Patient arrived to floor via stretcher via care link from [facility].  She presented to the ED with fever, chills and muscle aches. Per ED nurse, she was given Zosyn, Vanc and 1 gm of tylenol, along with 1L bolus of normal saline. She was Diagnosed with a UTI. She ambulated to the bathroom from the stretcher, then to her bed.  Alert and oriented, She stated she was feeling much better.  Slight temp on arrival; however other vitals stable.  She is from home.  IV in her RH and LWrist.  Oriented to room. Patient is stable, safety maintained.

## 2016-01-31 NOTE — Consult Note (Addendum)
Patient ID: Alexandra Walker MRN: [id number], DOB/AGE: [birth year]   Admit date: 01/30/2016   Reason for Consult: tachyarrthymias  Requesting MD: Dr. Elsworth Soho, Pulmonary/Critical Care   Primary Physician: Garlan Fillers, MD Primary Cardiologist: Dr. Angelena Form  Pt. Profile:  60 y/o female with h/o HTN, HLD, PACs, negative stress test in 2009, hysterectomy 09/2015, admitted with severe sepsis with likely source being UTI/ Pyelonephritis.  Hospital course complicated by SVT and WCT.   Problem List  Past Medical History:  Diagnosis Date  . 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 Arthritis   . Chronic depressive disorder   . Chronic edema    bilateral ankles  . Circadian rhythm sleep disorder   . Diabetes ([place])   . Dysrhythmia    PAC, PJC, managed well with toprolol  . Ganglion of knee    left  . GERD (gastroesophageal reflux disease)    rare  . Glucose intolerance (impaired glucose tolerance)   . HLA B27 (HLA B27 positive)   . Hyperlipidemia   . Hypertension   . IBS (irritable bowel syndrome)   . Ischemic colitis ([place])   . Kyphosis   . Lateral epicondylitis of right elbow   . Lymphadenopathy   . Migraine headache    history of  . Morbid obesity ([place])   . Onycholysis   . PAC (premature atrial contraction)   . Paronychia   . PMS (premenstrual syndrome)   . PONV (postoperative nausea and vomiting)   . Sacroiliitis ([place])   . Seasonal allergies   . Trigger ring finger of left hand   . Vitamin D deficiency     Past Surgical History:  Procedure Laterality Date  . abdominal laparascopic  92  . BLADDER SURGERY    . BREAST SURGERY  2003   bx lt-neg  . CERVICAL CONE BIOPSY    . CHOLECYSTECTOMY N/A 10/26/2014   Procedure: LAPAROSCOPIC CHOLECYSTECTOMY;  Surgeon: Coralie Keens, MD;  Location: [facility];  Service: General;  Laterality: N/A;  . COLONOSCOPY W/ BIOPSIES  multiple  . DIAGNOSTIC LAPAROSCOPY    . DILATION AND CURETTAGE OF UTERUS  07,11  .  ESOPHAGOGASTRODUODENOSCOPY  multiple  . KNEE ARTHROSCOPY  07,10   left  . LAPAROSCOPIC VAGINAL HYSTERECTOMY WITH SALPINGO OOPHORECTOMY Bilateral 10/03/2015   Procedure: LAPAROSCOPIC ASSISTED VAGINAL HYSTERECTOMY WITH SALPINGECTOMY;  Surgeon: Eldred Manges, MD;  Location: [facility];  Service: Gynecology;  Laterality: Bilateral;  . left finger surgery    . MASS EXCISION Left 12/24/2012   Procedure: LEFT EXCISION OF PALMAR MASS X TWO;  Surgeon: Roseanne Kaufman, MD;  Location: [facility];  Service: Orthopedics;  Laterality: Left;     Allergies  Allergies  Allergen Reactions  . Ace Inhibitors Other (See Comments)    coughing  . Oxycodone-Acetaminophen Nausea And Vomiting  . Percocet [Oxycodone-Acetaminophen] Nausea And Vomiting  . Vicodin [Hydrocodone-Acetaminophen] Nausea And Vomiting  . Victoza [Liraglutide] Other (See Comments)    pancreatitis    HPI  The patient is a 60 y/o female, followed by Dr. Angelena Form, with a h/o HTN, HLD, PACs, IBS, esophagitis s/p esophageal dilatation in 2014, glucose intolerance and recent hysterectomy 09/2015. She has had several stress test that were normal with negative stress echo in 2003, 2005 and a normal stress myoview in 2009. She was recently seen by Dr. Angelena Form in clinic 01/11/16 and complained of chest pain and dyspnea. A Coronary CT was recommended to exclude CAD, however this is not yet completed. Scheduled for  02/05/16. She did have a 2D echo in May of this year which demonstrated normal LVEF of 60-65%, normal wall motion, a mildly dilated LA but no significant valvular disease.   She presented to [facility] night of 01/30/16 with complaint of fever and chills. In ED, urinalysis concerning for urinary tract infection despite no elevation in WBC. Fevers as high as 105F. She was transferred to [facility] to telemetry but was observed to go into SVT with WCT requiring adenosine, metoprolol then Cardizem drip. There were reports that after adenosine, when  rate slowed, there was visible afib. She ultimately converted to NSR with Cardizem. She was  transferred to the ICU. She reports that she was completely asymptomatic with her arrhthymias.   Her phosphate is low at 1.2. Also hypokalemic with K of 3.3. Mg low at 1.3. Lactic acid elevated at 2.6 but trending down at 2.3. Initial troponin 0.23. She is now afebrile. WBC normal. BP is soft but stable. NSR on telemetry. HR in the 60s. She is on IV antibiotics. Vanc and Zosyn. Blood cultures drawn. Results pending.    Home Medications  Prior to Admission medications   Medication Sig Start Date End Date Taking? Authorizing Provider  aspirin 325 MG tablet Take 325 mg by mouth daily.   Yes Historical Provider, MD  cholecalciferol (VITAMIN D) 1000 UNITS tablet Take 1,000 Units by mouth daily.   Yes Historical Provider, MD  cyclobenzaprine (FLEXERIL) 5 MG tablet Take 1 tablet (5 mg total) by mouth at bedtime as needed for muscle spasms. 07/25/14  Yes Dene Gentry, MD  hydrochlorothiazide 25 MG tablet Take 25 mg by mouth daily. Once daily   Yes Historical Provider, MD  losartan (COZAAR) 50 MG tablet Take 50 mg by mouth daily.   Yes Historical Provider, MD  Magnesium 500 MG CAPS Take 1 capsule by mouth at bedtime.   Yes Historical Provider, MD  metFORMIN (GLUCOPHAGE) 500 MG tablet Take 1,000 mg by mouth 2 (two) times daily with a meal. 10/15/10  Yes Historical Provider, MD  metoprolol succinate (TOPROL-XL) 50 MG 24 hr tablet Take 50 mg by mouth daily. Take with or immediately following a meal.   Yes Historical Provider, MD  Omega-3 Fatty Acids (FISH OIL PO) Take 1 tablet by mouth daily.   Yes Historical Provider, MD  pantoprazole (PROTONIX) 40 MG tablet Take 40 mg by mouth daily as needed (FOR UPSET STOMACH).   Yes Historical Provider, MD  simvastatin (ZOCOR) 40 MG tablet Take 40 mg by mouth every evening.   Yes Historical Provider, MD  traMADol (ULTRAM) 50 MG tablet Take 1 tablet (50 mg total) by mouth every 4  (four) hours as needed for severe pain (start tramadol if patient has taken oral food with nausea). 10/04/15  Yes Elmira Powell, PA-C  Turmeric 450 MG CAPS Take 900 mg by mouth daily.   Yes Historical Provider, MD    Family History  Family History  Problem Relation Age of Onset  . Heart attack Father   . Heart disease Father   . Coronary artery disease Mother   . Hypertension Mother   . Heart disease Mother   . Heart attack Mother   . Hypertension Sister   . Hypertension Brother   . Colon cancer Neg Hx     Social History  Social History   Social History  . Marital status: Divorced    Spouse name: N/A  . Number of children: 1  . Years of education: N/A  Occupational History  . [place] [employer]   Social History Main Topics  . Smoking status: Never Smoker  . Smokeless tobacco: Never Used  . Alcohol use 0.0 oz/week     Comment: social  . Drug use: No  . Sexual activity: Not on file   Other Topics Concern  . Not on file   Social History Narrative   Nurse midwife with the [employer]     Review of Systems General:  No chills, fever, night sweats or weight changes.  Cardiovascular:  No chest pain, dyspnea on exertion, edema, orthopnea, palpitations, paroxysmal nocturnal dyspnea. Dermatological: No rash, lesions/masses Respiratory: No cough, dyspnea Urologic: No hematuria, dysuria Abdominal:   No nausea, vomiting, diarrhea, bright red blood per rectum, melena, or hematemesis Neurologic:  No visual changes, wkns, changes in mental status. All other systems reviewed and are otherwise negative except as noted above.  Physical Exam  Blood pressure (!) 100/57, pulse 69, temperature 99.1 F (37.3 C), temperature source Oral, resp. rate (!) 27, height 5' 7" (1.702 m), weight 258 lb 6.1 oz (117.2 kg), SpO2 90 %.  General: Pleasant, NAD, moderately obese Psych: Normal affect. Neuro: Alert and oriented X 3. Moves all extremities  spontaneously. HEENT: Normal  Neck: Supple without bruits or JVD. Lungs:  Resp regular and unlabored, CTA. Heart: RRR no s3, s4, or murmurs. Abdomen: Soft, non-tender, non-distended, BS + x 4.  Extremities: No clubbing, cyanosis or edema. DP/PT/Radials 2+ and equal bilaterally.  Labs  Troponin (Point of Care Test) No results for input(s): TROPIPOC in the last 72 hours.  Recent Labs  01/31/16 0449  TROPONINI 0.23*   Lab Results  Component Value Date   WBC 8.9 01/31/2016   HGB 12.9 01/31/2016   HCT 40.6 01/31/2016   MCV 85.8 01/31/2016   PLT 153 01/31/2016    Recent Labs Lab 01/30/16 1935 01/31/16 0449  NA 136 138  K 3.5 3.3*  CL 102 107  CO2 25 23  BUN 13 9  CREATININE 0.74 0.91  CALCIUM 9.3 8.1*  PROT 7.0  --   BILITOT 0.8  --   ALKPHOS 76  --   ALT 30  --   AST 30  --   GLUCOSE 127* 167*   No results found for: CHOL, HDL, LDLCALC, TRIG Lab Results  Component Value Date   DDIMER 0.42 10/04/2015     Radiology/Studies  Dg Chest 2 View  Result Date: 01/30/2016 CLINICAL DATA:  Sudden onset fever today EXAM: CHEST  2 VIEW COMPARISON:  Oct 04, 2015 FINDINGS: The heart size and mediastinal contours are within normal limits. The aorta is tortuous. There is no focal infiltrate, pulmonary edema, or pleural effusion. Chronic change of left upper ribs are stable. IMPRESSION: No active cardiopulmonary disease. Electronically Signed   By: Abelardo Diesel M.D.   On: 01/30/2016 21:39   US Renal  Result Date: 01/31/2016 CLINICAL DATA:  Acute pyelonephritis, essential benign hypertension, type II diabetes mellitus EXAM: RENAL / URINARY TRACT ULTRASOUND COMPLETE COMPARISON:  Abdominal ultrasound 09/13/2014 FINDINGS: Right Kidney: Length: 12.3 cm. Normal cortical thickness and echogenicity. No mass, hydronephrosis or shadowing calcification. No perinephric fluid. Left Kidney: Length: 13.0 cm. Normal cortical thickness and echogenicity. Shadowing echogenic focus 12 mm diameter at  inferior pole question nonobstructing calculus. Bladder: Normal appearance Incidentally noted increased echogenicity of the liver, likely fatty infiltration though this can be seen with cirrhosis and certain infiltrative disorders. IMPRESSION: Question 12 mm nonobstructing calculus at inferior pole  LEFT kidney. Otherwise unremarkable sonographic appearance of the kidneys. Question fatty infiltration of liver as above. Electronically Signed   By: Lavonia Dana M.D.   On: 01/31/2016 09:37    ECG  Most recent 01/31/16 _0  - NSR with 1st degree AVB.   ASSESSMENT AND PLAN Principal Problem:   Sepsis ([place]) Active Problems:   SVT (supraventricular tachycardia) (HCC)   UTI (lower urinary tract infection)   Essential hypertension   Diabetes mellitus type 2, controlled ([place])   Acute pyelonephritis   1. Tachyrhythmia:  rate was in the 190s. Per report, IV adenosine was given. Rate slowed but did not break. Underlying rhythm appeared to be atrial fibrillation, which makes sense given she converted with Cardizem and not adenosine. This is in the setting of sepsis secondary to acute pyelonephritis and electrolyte abnormalities (hypokalemia and hypophosphatemia and hypomagnesemia, K 3.3, Phos 1.2, Mg 1.3). She is on broad spectrum antibiotics with Vanc + Zosyn. Blood Cultures pending. Correct electrolyte abnormalities to reduce risk of recurrence and continue management of underlying illness. Recommend scheduled PO Cardizem to prevent recurrence. Her CHA2DS2 VASc score is 3 for HTN, DM and female sex. However duration of her afib was < 48 hrs and in the setting of acute infection, thus no indication for a/c at this time. Treat with ASA. We will continue to monitor.    2. Chest Pain: patient denies any chest pain this admission, however she was evaluated by Dr. Angelena Form in clinic 01/11/16 and was ordered to undergo an outpatient cardiac CT to r/o underlying CAD given recent h/o CP, HTN, HLD and family h/o CAD. She  has had 3 negative stress test in the past. We will continue plans for this, once she recovers from her pyelonephritis.   3. Sepsis/Pyelonephritis: on Vanc + zosyn. Blood cultures pending. Management per primary team.   4. Elevated Troponin: 0.23>>0.58. She denies any CP related to this admission. Suspect demand ischemia secondary to extreme tachycardia with V-rates in the 190s during her arrhthymia. Continue to cycle x 3. Recent echo showed structurally normal heart. MD to follow with further recommendation.    Signed, Lyda Jester, PA-C 01/31/2016, 9:44 AM  The patient has been seen in conjunction with Ellen Henri, PA-C. All aspects of care have been considered and discussed. The patient has been personally interviewed, examined, and all clinical data has been reviewed.   Acute onset of tachycardia compatible with atrial fibrillation and very rapid ventricular response in the setting of rigors and fever spike to 105F. Noted to have recurrent urinary tract infection and possibly sepsis.  The rapid heart rate resolved after less than 3 hours. IV diltiazem was used and this helped to slow the rate prior to it eventually spontaneously converting.  Cardiac markers are mildly elevated and likely represent demand ischemia. She has had serial stress perfusion studies in the past with no documentation of ischemia. She is currently scheduled to have a coronary CTA performed this month. If her enzyme pattern remains compatible with demand ischemia, we will likely continue with the plan to perform coronary CT angiography.  Plan observation for now. Treatment of infection. Further recommendations based upon accumulating database.

## 2016-01-31 NOTE — Progress Notes (Signed)
Patient arrived to the floor from [facility]. Was feeling much better when she arrived, now she is shivering, teeth chattering and states "I hurt all over" Is Afebrile. Paged OnCall and informed her of this. Patient oriented to equiptment and call bell.  Will monitor.

## 2016-01-31 NOTE — Progress Notes (Addendum)
[facility] Progress Note Patient Name: Alexandra Walker DOB: 03-31-1956 MRN: [id number]   Date of Service  01/31/2016  HPI/Events of Note  Multiple issues: 1. K+ = 3.3, Mg++ = 1.3 and PO4--- = 1.2 and 2. Troponin #1 = 2.3. Was in AFIB with ventricular rate = 190. Repeat EKG now NSR with rate = 82, 1st degree AV block, Nonspecific IV conduction block and Nonspecific T wave abnormality. Demand ischemia?  eICU Interventions  Will order: 1. Replace Mg++, K+ and PO4---. 2. ASA 81 mg PO now and Q day.  3. Will continue to trend Troponin.     Intervention Category Major Interventions: Electrolyte abnormality - evaluation and management  [name] Eugene 01/31/2016, 5:53 AM

## 2016-01-31 NOTE — Progress Notes (Signed)
  PHARMACY - PHYSICIAN COMMUNICATION CRITICAL VALUE ALERT - BLOOD CULTURE IDENTIFICATION (BCID)  No results found for this or any previous visit.  Name of physician (or Provider) Contacted: Fatima Blank  Changes to prescribed antibiotics required: Blood cx shows GNRs, BCID reported Kleb Pneumo with no carbapenem resistance. After discussion with MD stopped Zosyn and vancomycin and start ceftriaxone.  Elenor Quinones, PharmD, BCPS Clinical Pharmacist Pager [phone] 01/31/2016 2:34 PM

## 2016-01-31 NOTE — Progress Notes (Signed)
Re-checked patients oral temperature and it has progressed to 103F, paged McQuaid MD again, and spoke with him regarding her change in temp after receiving the tylenol. He stated to continue to monitor and page if there was any change in vitals or mental status. RN on night shift is aware and will continue to monitor.  Cyndia Bent

## 2016-01-31 NOTE — Progress Notes (Signed)
PULMONARY / CRITICAL CARE MEDICINE   Name: Alexandra Walker MRN: [id number] DOB: [birth year]    ADMISSION DATE:  01/30/2016 CONSULTATION DATE:  9/13  REFERRING MD:  Dr. Loleta Books TRH  CHIEF COMPLAINT:  fevers  HISTORY OF PRESENT ILLNESS:   60 year old female with PMH as below, which is significant for DM, HTN, Obesity, PAC and PJCs, and Anticardiolipin syndrome. She recently had some postmenopausal bleeding deemed to be the result of low grade squamous intraepithelial dysplasia. She underwent elective hysterectomy for this 09/2015. Since that time she has had several urinary tract infections with only symptoms being fevers. 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 There improved after taking some tylenol for fevers. Fevers worsened and she again developed rigors associated with muscle aching prompting her to present to [facility] ED. In ED urinalysis concerning for urinary tract infection despite no elevation in WBC. Fevers as high as 105F. She was transferred to [facility] for admission to medical floor where she was found to be in SVT. PCCM consulted for further evaluation.   SUBJECTIVE: NAD at rest  VITAL SIGNS: BP (!) 107/56   Pulse 74   Temp 99.1 F (37.3 C) (Oral)   Resp 19   Ht 5\' 7"  (1.702 m)   Wt 258 lb 6.1 oz (117.2 kg)   SpO2 96%   BMI 40.47 kg/m   HEMODYNAMICS:    VENTILATOR SETTINGS:    INTAKE / OUTPUT: I/O last 3 completed shifts: In: 1581.3 [I.V.:31.3; IV [id number] Out: 300 [Urine:300]  PHYSICAL EXAMINATION: General:  Obese female stable and in sr Neuro:  Alert, oriented, non-focal HEENT:  [place]/AT, PERRL Cardiovascular:  SR 70, regular, off drps Lungs:  Clear, normal WOB Abdomen:  Soft, non-tender, non-distended. +BS normoactive Musculoskeletal:  Mild lower extremity edema baseline. Pleuritic chest pain Skin:  Grossly intact.   LABS:  BMET  Recent Labs Lab 01/30/16 1935 01/31/16 0449   NA 136 138  K 3.5 3.3*  CL 102 107  CO2 25 23  BUN 13 9  CREATININE 0.74 0.91  GLUCOSE 127* 167*    Electrolytes  Recent Labs Lab 01/30/16 1935 01/31/16 0449  CALCIUM 9.3 8.1*  MG  --  1.3*  PHOS  --  1.2*    CBC  Recent Labs Lab 01/30/16 1935 01/31/16 0449  WBC 8.7 8.9  HGB 14.5 12.9  HCT 43.0 40.6  PLT 207 153    Coag's No results for input(s): APTT, INR in the last 168 hours.  Sepsis Markers  Recent Labs Lab 01/30/16 1943 01/30/16 2239 01/31/16 0449  LATICACIDVEN 2.41* 2.61* 2.3*    ABG No results for input(s): PHART, PCO2ART, PO2ART in the last 168 hours.  Liver Enzymes  Recent Labs Lab 01/30/16 1935  AST 30  ALT 30  ALKPHOS 76  BILITOT 0.8  ALBUMIN 4.0    Cardiac Enzymes  Recent Labs Lab 01/31/16 0449  TROPONINI 0.23*    Glucose  Recent Labs Lab 01/31/16 0521  GLUCAP 165*    Imaging Dg Chest 2 View  Result Date: 01/30/2016 CLINICAL DATA:  Sudden onset fever today EXAM: CHEST  2 VIEW COMPARISON:  Oct 04, 2015 FINDINGS: The heart size and mediastinal contours are within normal limits. The aorta is tortuous. There is no focal infiltrate, pulmonary edema, or pleural effusion. Chronic change of left upper ribs are stable. IMPRESSION: No active cardiopulmonary disease. Electronically Signed   By: Mallie Darting.D.  On: 01/30/2016 21:39    STUDIES:  9/13 2d>>  CULTURES: BCx2 9/12 >  Urine 9/12 >  ANTIBIOTICS: Zosyn 9/12 > Vancomycin  9/12 >  SIGNIFICANT EVENTS: 9/12 admit for UTI 9/13 SVT, high fevers, to ICU  LINES/TUBES:   DISCUSSION: 60 year old female with recent UTIs s/p hysterectomy admitted for urosepsis 9/13. High fevers developed SVT. Transfer to ICU for close monitoring. Hope will convert with IVF, temp control, and metop. May need adenosine.   ASSESSMENT / PLAN:  PULMONARY A: No acute issues  P:   Supplemental O2 IS  CARDIOVASCULAR A:  Supraventricular tachycardia > suspect in setting of  fevers. Elevated lactic H/o HTN, HLD, PAC/PJC (controlled on toprol) Extensive cardiac workup in past negative  P:  Telemetry monitoring SVT converted once fever down, dilt drip off. Cycle troponin 2 d Ensure lactic clearing Continue home simvastatin, metoprolol, cozaar, HCTZ, ASA Tx to tele later today(9-13) if stable and to [facility] service in am Cards consult  RENAL  Recent Labs Lab 01/30/16 1935 01/31/16 0449  K 3.5 3.3*     A:   Replete lytes  P:   Repeat BMP in AM  GASTROINTESTINAL A:   No acute issues  P:   NPO Pepcid  HEMATOLOGIC A:   No acute issues  P:  Follow CBC SCDs and early ambulation for VTE ppx  INFECTIOUS A:   Sepsis secondary to urinary tract infection  P:   ABX as above, treat as complicated UTI as this is 3rd in past few months Follow cultures Trend PCT Renal US ordered 9/13  ENDOCRINE CBG (last 3)   Recent Labs  01/31/16 0521  GLUCAP 165*     A:   DM  P:   Hold metformin CBG monitoring and SSI  NEUROLOGIC A:   No acute issues  P:   Monitor   FAMILY  - Updates:  Patient updated extensively at bedside   PLAN: Check 2d, cards consult, replete lytes, treat infection, renal US. Transfer to tele later today if stable and to triad service in am.  Richardson Landry Minor ACNP Maryanna Shape PCCM Pager [phone] till 3 pm If no answer page [phone] 01/31/2016, 8:40 AM  Attending Note:  61 year old female with PMH of DM presenting with pyelo and fever then developed SVT and was transferred to the ICU.  Patient had a period of a-fib that required cardizem that now has resolved and been off drip.  On exam, lungs are clear to auscultation.  I reviewed CXR myself, no acute disease.  Will continue abx for pyelo/UTI as IV given this morning's event.  Transfer to tele given SVT.  F/U on culture.  Transfer care to [facility] and patient to tele with PCCM off 9/14.  Discussed with TRH-MD  Pyelo:  - Vanc  - Zosyn.  - F/U on  culture  A-fib:  - Tele monitoring.  - D/C diltiazem.  SVT: due to high fever.  - Tele monitoring.  - Cards to see.  DM:  - Metformin  - CBGs  - ISS  Patient seen and examined, agree with above note.  I dictated the care and orders written for this patient under my direction.  Rush Farmer, M.D. [facility] Pulmonary/Critical Care Medicine. Pager: [phone]. After hours pager: [phone].

## 2016-02-01 ENCOUNTER — Encounter (HOSPITAL_COMMUNITY): Payer: Self-pay | Admitting: Radiology

## 2016-02-01 ENCOUNTER — Inpatient Hospital Stay (HOSPITAL_COMMUNITY): Payer: 59

## 2016-02-01 DIAGNOSIS — Z8619 Personal history of other infectious and parasitic diseases: Secondary | ICD-10-CM

## 2016-02-01 DIAGNOSIS — I480 Paroxysmal atrial fibrillation: Secondary | ICD-10-CM

## 2016-02-01 LAB — BASIC METABOLIC PANEL
Anion gap: 7 (ref 5–15)
BUN: 10 mg/dL (ref 6–20)
CALCIUM: 7.9 mg/dL — AB (ref 8.9–10.3)
CO2: 23 mmol/L (ref 22–32)
CREATININE: 0.64 mg/dL (ref 0.44–1.00)
Chloride: 110 mmol/L (ref 101–111)
Glucose, Bld: 103 mg/dL — ABNORMAL HIGH (ref 65–99)
Potassium: 3.3 mmol/L — ABNORMAL LOW (ref 3.5–5.1)
SODIUM: 140 mmol/L (ref 135–145)

## 2016-02-01 LAB — GLUCOSE, CAPILLARY
GLUCOSE-CAPILLARY: 154 mg/dL — AB (ref 65–99)
GLUCOSE-CAPILLARY: 91 mg/dL (ref 65–99)
GLUCOSE-CAPILLARY: 91 mg/dL (ref 65–99)
GLUCOSE-CAPILLARY: 99 mg/dL (ref 65–99)
Glucose-Capillary: 181 mg/dL — ABNORMAL HIGH (ref 65–99)
Glucose-Capillary: 211 mg/dL — ABNORMAL HIGH (ref 65–99)

## 2016-02-01 LAB — CBC
HCT: 38.4 % (ref 36.0–46.0)
Hemoglobin: 12.5 g/dL (ref 12.0–15.0)
MCH: 27.8 pg (ref 26.0–34.0)
MCHC: 32.6 g/dL (ref 30.0–36.0)
MCV: 85.3 fL (ref 78.0–100.0)
PLATELETS: 123 10*3/uL — AB (ref 150–400)
RBC: 4.5 MIL/uL (ref 3.87–5.11)
RDW: 13.1 % (ref 11.5–15.5)
WBC: 4.4 10*3/uL (ref 4.0–10.5)

## 2016-02-01 LAB — MAGNESIUM: MAGNESIUM: 2.3 mg/dL (ref 1.7–2.4)

## 2016-02-01 LAB — PHOSPHORUS: Phosphorus: 2 mg/dL — ABNORMAL LOW (ref 2.5–4.6)

## 2016-02-01 MED ORDER — IOPAMIDOL (ISOVUE-300) INJECTION 61%
15.0000 mL | INTRAVENOUS | Status: AC
  Administered 2016-02-01 (×2): 15 mL via ORAL

## 2016-02-01 MED ORDER — IOPAMIDOL (ISOVUE-300) INJECTION 61%
INTRAVENOUS | Status: AC
  Administered 2016-02-01: 100 mL
  Filled 2016-02-01: qty 100

## 2016-02-01 MED ORDER — K PHOS MONO-SOD PHOS DI & MONO 155-852-130 MG PO TABS
500.0000 mg | ORAL_TABLET | Freq: Two times a day (BID) | ORAL | Status: DC
  Administered 2016-02-01 – 2016-02-02 (×3): 500 mg via ORAL
  Filled 2016-02-01 (×4): qty 2

## 2016-02-01 MED ORDER — SACCHAROMYCES BOULARDII 250 MG PO CAPS
250.0000 mg | ORAL_CAPSULE | Freq: Two times a day (BID) | ORAL | Status: DC
Start: 2016-02-01 — End: 2016-02-02
  Administered 2016-02-01 – 2016-02-02 (×3): 250 mg via ORAL
  Filled 2016-02-01 (×3): qty 1

## 2016-02-01 MED ORDER — ENOXAPARIN SODIUM 40 MG/0.4ML ~~LOC~~ SOLN
40.0000 mg | SUBCUTANEOUS | Status: DC
  Administered 2016-02-01: 40 mg via SUBCUTANEOUS
  Filled 2016-02-01 (×2): qty 0.4

## 2016-02-01 MED ORDER — SODIUM CHLORIDE 0.9 % IV SOLN
INTRAVENOUS | Status: DC
  Administered 2016-02-01 – 2016-02-02 (×3): via INTRAVENOUS

## 2016-02-01 NOTE — Consult Note (Addendum)
   [facility] CM Inpatient Consult   02/01/2016  Alexandra Walker [birth year] [id number]    Went to bedside to speak with [name] on behalf of [facility] to [facility] Care Management program for [employer] employees/dependents with [facility] insurance. Briefly spoke with [name] about Link to [employer] specifically for DM management, HTN, HLD. She was not up to discussing as she was resting. Provided complete Link to [employer], contact information, brochure, and 24-hour nurse line magnet. Confirmed best contact number as [phone] for post hospital follow up call. Will make inpatient RNCM aware of bedside encounter.    Marthenia Rolling, MSN-Ed, RN,BSN [facility] Liaison [phone]

## 2016-02-01 NOTE — Progress Notes (Addendum)
Patient Profile: 60 y/o female with h/o HTN, HLD, PACs, negative stress test in 2009, hysterectomy 09/2015, admitted with sepsis with likely source being UTI/ Pyelonephritis.  Hospital course complicated by atrial fibrillation with extreme tachycardia in the 190s.   Subjective: Doing well this am. She reports she had a fever overnight with rigors. Currently asymptomatic.   Objective: Vital signs in last 24 hours: Temp:  [98.4 F (36.9 C)-103 F (39.4 C)] 98.5 F (36.9 C) (09/14 0441) Pulse Rate:  [63-93] 70 (09/14 0441) Resp:  [16-27] 20 (09/13 2034) BP: (99-107)/(44-61) 101/44 (09/14 0441) SpO2:  [90 %-98 %] 98 % (09/14 0441) Last BM Date:  (PTA)  Intake/Output from previous day: 09/13 0701 - 09/14 0700 In: 290 [P.O.:240; IV Piggyback:50] Out: -  Intake/Output this shift: No intake/output data recorded.  Medications Current Facility-Administered Medications  Medication Dose Route Frequency Provider Last Rate Last Dose  . acetaminophen (TYLENOL) tablet 1,000 mg  1,000 mg Oral Q4H PRN Rhetta Mura Schorr, NP   1,000 mg at 02/01/16 0555   Or  . acetaminophen (TYLENOL) suppository 650 mg  650 mg Rectal Q4H PRN Rhetta Mura Schorr, NP      . aspirin chewable tablet 81 mg  81 mg Oral Daily Anders Simmonds, MD   81 mg at 01/31/16 1012  . cefTRIAXone (ROCEPHIN) 2 g in dextrose 5 % 50 mL IVPB  2 g Intravenous Q24H Rush Farmer, MD   2 g at 01/31/16 1650  . diltiazem (CARDIZEM) 100 mg in dextrose 5% 19mL (1 mg/mL) infusion  5-15 mg/hr Intravenous Titrated Corey Harold, NP   Stopped at 01/31/16 0715  . ibuprofen (ADVIL,MOTRIN) tablet 400 mg  400 mg Oral Q6H PRN Juanito Doom, MD   400 mg at 01/31/16 2025  . insulin aspart (novoLOG) injection 1-3 Units  1-3 Units Subcutaneous Q4H Corey Harold, NP   2 Units at 01/31/16 0557  . ondansetron (ZOFRAN) injection 4 mg  4 mg Intravenous Once Fatima Blank, MD   Stopped at 01/30/16 1945  . pantoprazole (PROTONIX) EC tablet 40  mg  40 mg Oral Q1200 Corey Harold, NP   40 mg at 01/31/16 1337  . sodium chloride (OCEAN) 0.65 % nasal spray 1 spray  1 spray Each Nare PRN Corey Harold, NP      . sodium chloride 0.9 % bolus 500 mL  500 mL Intravenous Once Jeryl Columbia, NP        PE: General: Pleasant, NAD, moderately obese Psych: Normal affect. Neuro: Alert and oriented X 3. Moves all extremities spontaneously. HEENT: Normal           Neck: Supple without bruits or JVD. Lungs:  Resp regular and unlabored, CTA. Heart: RRR no s3, s4, or murmurs. Abdomen: Soft, non-tender, non-distended, BS + x 4.  Extremities: No clubbing, cyanosis or edema. DP/PT/Radials 2+ and equal bilaterally.   Lab Results:   Recent Labs  01/30/16 1935 01/31/16 0449 02/01/16 0451  WBC 8.7 8.9 4.4  HGB 14.5 12.9 12.5  HCT 43.0 40.6 38.4  PLT 207 153 123*   BMET  Recent Labs  01/30/16 1935 01/31/16 0449 02/01/16 0451  NA 136 138 140  K 3.5 3.3* 3.3*  CL 102 107 110  CO2 25 23 23   GLUCOSE 127* 167* 103*  BUN 13 9 10   CREATININE 0.74 0.91 0.64  CALCIUM 9.3 8.1* 7.9*   PT/INR No results for input(s): LABPROT, INR in the last 72  hours. Cholesterol No results for input(s): CHOL in the last 72 hours. Cardiac Panel (last 3 results)  Recent Labs  01/31/16 0449 01/31/16 0909 01/31/16 1535  TROPONINI 0.23* 0.58* 0.73*    Studies/Results:  Study Conclusions  - Left ventricle: The cavity size was normal. Systolic function was   normal. The estimated ejection fraction was in the range of 60%   to 65%. Wall motion was normal; there were no regional wall   motion abnormalities. Left ventricular diastolic function   parameters were normal. - Left atrium: The atrium was mildly dilated.  Assessment/Plan  Principal Problem:   Sepsis ([place]) Active Problems:   SVT (supraventricular tachycardia) (HCC)   UTI (lower urinary tract infection)   Essential hypertension   Diabetes mellitus type 2, controlled ([place])    Acute pyelonephritis   Pyelonephritis    1. Transient Atrial Fibrillation: telemetry reviewed. No further arrhthymias captured. NSR on telemetry. HR controlled in the 70s. This is in the setting of sepsis secondary to acute pyelonephritis and electrolyte abnormalities (hypokalemia and hypophosphatemia and hypomagnesemia, K 3.3, Phos 1.2, Mg 1.3). She is on IV antibiotics. Hypomagnesemia has been corrected, now at 2.3. K and Phos still low at 3.3 and 2.0 respectively. Correct electrolyte abnormalities to reduce risk of recurrence and continue management of underlying illness. Her CHA2DS2 VASc score is 3 for HTN, DM and female sex. However, the duration of her afib was < 48 hrs and in the setting of acute infection, thus no indication for a/c at this time. Treat with ASA. We will continue to monitor.   2. Elevated Troponin: 0.23>>0.58>>0.73. This may be secondary to demand ischemia from extreme tachycardia. V-rate during afib episode was in the 190s. She denies any recent CP and 2D echo shows normal LVEF of 60-65% w/o WMA. She has had serial stress perfusion studies in the past with no documentation of ischemia. She is currently scheduled to have a coronary CTA performed this month to better assess her coronary anatomy. This is scheduled for next week, 02/05/16. If findings suggest significant CAD, she will need definitive cath.   3. Sepsis/ Pyelonephritis: on antibiotics, IV Rocephin. Management per primary team.    LOS: 2 days    Brittainy M. Ladoris Gene 02/01/2016 7:58 AM The patient has been seen in conjunction with Lyda Jester, PA-C. All aspects of care have been considered and discussed. The patient has been personally interviewed, examined, and all clinical data has been reviewed.   No recurrence of atrial fibrillation. Overall clinical condition is improved. Elevated troponin is felt secondary to demand (marked tachycardia). At this point atrial fibrillation is likely stress  associated. Despite elevated risk factors for stroke(CHADS VASC) we feel that aspirin daily is prudent until or she has atrial fibrillation without provocation by stress.  It is mandatory that she keep the appointment for the upcoming coronary CTA later this month.  Please contact us if we can provide further assistance.

## 2016-02-01 NOTE — Progress Notes (Signed)
PROGRESS NOTE    Alexandra Walker  [id number] DOB: 1955-06-10 DOA: 01/30/2016 PCP: Garlan Fillers, MD   Brief Narrative: 60 year old female with PMH as below, which is significant for DM, HTN, Obesity, PAC and PJCs, and Anticardiolipin syndrome. She recently had some postmenopausal bleeding deemed to be the result of low grade squamous intraepithelial dysplasia. She underwent elective hysterectomy for this 09/2015. Since that time she has had several urinary tract infections with only symptoms being fevers. 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 There improved after taking some tylenol for fevers. Fevers worsened and she again developed rigors associated with muscle aching prompting her to present to [facility] ED. In ED urinalysis concerning for urinary tract infection despite no elevation in WBC. Fevers as high as 105F. She was transferred to [facility] for admission to medical floor where she was found to be in SVT. PCCM consulted for further evaluation.    Assessment & Plan:   Principal Problem:   Sepsis ([place]) Active Problems:   UTI (lower urinary tract infection)   Essential hypertension   Diabetes mellitus type 2, controlled ([place])   Acute pyelonephritis   Pyelonephritis   PAF (paroxysmal atrial fibrillation) (HCC)  Sepsis secondary to urinary tract infection ABX as above, treat as complicated UTI as this is 3rd in past few months Follow cultures Renal US negative for pyelo.  Continue to spike fevers, she has had chronic pain after hysterectomy and UTI. Will get CT abdomen to rule out abscess.   Klebsiella Bacteremia;  Continue with ceftriaxone follow sensitivity   Supraventricular tachycardia > suspect in setting of fevers. 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 Mild elevation of  troponin.  2 d Continue home simvastatin, metoprolol, cozaar, HCTZ, ASA Appreciate Dr Tamala Julian help, plan for [place] CT outpatient.   DM Hold metformin CBG monitoring and SSI  Hypophosphatemia;  Replete orally.   DVT prophylaxis: Lovenox.  Code Status; full code.  Family Communication: care discussed with patient.  Disposition Plan: home when afebrile and culture available.   Consultants:   Cardiology   CCM admitted patient.    Procedures:  Renal US. Negative for hydronephrosis.   Antimicrobials: ceftriaxone 9-13  Subjective: Feeling well, wants to go home.  Report chronic abdominal pain, supra-pubic since her hysterectomy, pain in her bladder.  Objective: Vitals:   01/31/16 2034 01/31/16 2139 02/01/16 0033 02/01/16 0441  BP: (!) 106/51   (!) 101/44  Pulse: 93   70  Resp: 20     Temp: (!) 102.4 F (39.1 C) 100.3 F (37.9 C) 98.8 F (37.1 C) 98.5 F (36.9 C)  TempSrc: Oral Oral Oral Oral  SpO2: 97%   98%  Weight:      Height:        Intake/Output Summary (Last 24 hours) at 02/01/16 1012 Last data filed at 02/01/16 0808  Gross per 24 hour  Intake              480 ml  Output                0 ml  Net              480 ml   Filed Weights   01/30/16 1921 01/31/16 0300  Weight: 113.4 kg (250 lb) 117.2 kg (258 lb 6.1  oz)    Examination:  General exam: Appears calm and comfortable  Respiratory system: Clear to auscultation. Respiratory effort normal. Cardiovascular system: S1 & S2 heard, RRR. No JVD, murmurs, rubs, gallops or clicks. No pedal edema. Gastrointestinal system: Abdomen is nondistended, soft and nontender. No organomegaly or masses felt. Normal bowel sounds heard. Central nervous system: Alert and oriented. No focal neurological deficits. Extremities: Symmetric 5 x 5 power. Skin: No rashes, lesions or ulcers Psychiatry: Judgement and insight appear normal. Mood & affect appropriate.     Data Reviewed: I have personally reviewed following labs and  imaging studies  CBC:  Recent Labs Lab 01/30/16 1935 01/31/16 0449 02/01/16 0451  WBC 8.7 8.9 4.4  NEUTROABS 8.2*  --   --   HGB 14.5 12.9 12.5  HCT 43.0 40.6 38.4  MCV 83.7 85.8 85.3  PLT 207 153 [id number]   Basic Metabolic Panel:  Recent Labs Lab 01/30/16 1935 01/31/16 0449 02/01/16 0451  NA 136 138 140  K 3.5 3.3* 3.3*  CL 102 107 110  CO2 25 23 23   GLUCOSE 127* 167* 103*  BUN 13 9 10   CREATININE 0.74 0.91 0.64  CALCIUM 9.3 8.1* 7.9*  MG  --  1.3* 2.3  PHOS  --  1.2* 2.0*   GFR: Estimated Creatinine Clearance: 102.6 mL/min (by C-G formula based on SCr of 0.64 mg/dL). Liver Function Tests:  Recent Labs Lab 01/30/16 1935  AST 30  ALT 30  ALKPHOS 76  BILITOT 0.8  PROT 7.0  ALBUMIN 4.0   No results for input(s): LIPASE, AMYLASE in the last 168 hours. No results for input(s): AMMONIA in the last 168 hours. Coagulation Profile: No results for input(s): INR, PROTIME in the last 168 hours. Cardiac Enzymes:  Recent Labs Lab 01/31/16 0449 01/31/16 0909 01/31/16 1535  TROPONINI 0.23* 0.58* 0.73*   BNP (last 3 results) No results for input(s): PROBNP in the last 8760 hours. HbA1C: No results for input(s): HGBA1C in the last 72 hours. CBG:  Recent Labs Lab 01/31/16 1609 01/31/16 2027 02/01/16 0031 02/01/16 0440 02/01/16 0745  GLUCAP 130* 123* 99 91 181*   Lipid Profile: No results for input(s): CHOL, HDL, LDLCALC, TRIG, CHOLHDL, LDLDIRECT in the last 72 hours. Thyroid Function Tests: No results for input(s): TSH, T4TOTAL, FREET4, T3FREE, THYROIDAB in the last 72 hours. Anemia Panel: No results for input(s): VITAMINB12, FOLATE, FERRITIN, TIBC, IRON, RETICCTPCT in the last 72 hours. Sepsis Labs:  Recent Labs Lab 01/30/16 1943 01/30/16 2239 01/31/16 0449  LATICACIDVEN 2.41* 2.61* 2.3*    Recent Results (from the past 240 hour(s))  Culture, blood (Routine x 2)     Status: Abnormal (Preliminary result)   Collection Time: 01/30/16  7:35 PM    Result Value Ref Range Status   Specimen Description BLOOD RIGHT ANTECUBITAL  Final   Special Requests BOTTLES DRAWN AEROBIC AND ANAEROBIC 5CC EACH  Final   Culture  Setup Time   Final    GRAM NEGATIVE RODS IN BOTH AEROBIC AND ANAEROBIC BOTTLES CRITICAL RESULT CALLED TO, READ BACK BY AND VERIFIED WITH: N. BATCHELDER, PHARMD AT 1402 ON 01/31/16 BY C. JESSUP, MLT.    Culture (A)  Final    KLEBSIELLA PNEUMONIAE SUSCEPTIBILITIES TO FOLLOW Performed at [facility]    Report Status PENDING  Incomplete  Blood Culture ID Panel (Reflexed)     Status: Abnormal   Collection Time: 01/30/16  7:35 PM  Result Value Ref Range Status   Enterococcus species NOT DETECTED NOT DETECTED  Final   Listeria monocytogenes NOT DETECTED NOT DETECTED Final   Staphylococcus species NOT DETECTED NOT DETECTED Final   Staphylococcus aureus NOT DETECTED NOT DETECTED Final   Streptococcus species NOT DETECTED NOT DETECTED Final   Streptococcus agalactiae NOT DETECTED NOT DETECTED Final   Streptococcus pneumoniae NOT DETECTED NOT DETECTED Final   Streptococcus pyogenes NOT DETECTED NOT DETECTED Final   Acinetobacter baumannii NOT DETECTED NOT DETECTED Final   Enterobacteriaceae species DETECTED (A) NOT DETECTED Final    Comment: CRITICAL RESULT CALLED TO, READ BACK BY AND VERIFIED WITH: N. BATCHELDER, PHARMD AT 1402 ON 01/31/16 BY C. JESSUP, MLT.    Enterobacter cloacae complex NOT DETECTED NOT DETECTED Final   Escherichia coli NOT DETECTED NOT DETECTED Final   Klebsiella oxytoca NOT DETECTED NOT DETECTED Final   Klebsiella pneumoniae DETECTED (A) NOT DETECTED Final    Comment: CRITICAL RESULT CALLED TO, READ BACK BY AND VERIFIED WITH: N. BATCHELDER, PHARMD AT 1402 ON 01/31/16 BY C. JESSUP, MLT.    Proteus species NOT DETECTED NOT DETECTED Final   Serratia marcescens NOT DETECTED NOT DETECTED Final   Carbapenem resistance NOT DETECTED NOT DETECTED Final   Haemophilus influenzae NOT DETECTED NOT DETECTED  Final   Neisseria meningitidis NOT DETECTED NOT DETECTED Final   Pseudomonas aeruginosa NOT DETECTED NOT DETECTED Final   Candida albicans NOT DETECTED NOT DETECTED Final   Candida glabrata NOT DETECTED NOT DETECTED Final   Candida krusei NOT DETECTED NOT DETECTED Final   Candida parapsilosis NOT DETECTED NOT DETECTED Final   Candida tropicalis NOT DETECTED NOT DETECTED Final    Comment: Performed at [facility]  Culture, blood (Routine x 2)     Status: None (Preliminary result)   Collection Time: 01/30/16  7:40 PM  Result Value Ref Range Status   Specimen Description BLOOD LEFT WRIST  Final   Special Requests BOTTLES DRAWN AEROBIC AND ANAEROBIC 5CC EACH  Final   Culture  Setup Time   Final    GRAM NEGATIVE RODS AEROBIC BOTTLE ONLY CRITICAL VALUE NOTED.  VALUE IS CONSISTENT WITH PREVIOUSLY REPORTED AND CALLED VALUE. Performed at [place]  Final   Report Status PENDING  Incomplete  MRSA PCR Screening     Status: None   Collection Time: 01/31/16  3:18 AM  Result Value Ref Range Status   MRSA by PCR NEGATIVE NEGATIVE Final    Comment:        The GeneXpert MRSA Assay (FDA approved for NASAL specimens only), is one component of a comprehensive MRSA colonization surveillance program. It is not intended to diagnose MRSA infection nor to guide or monitor treatment for MRSA infections.          Radiology Studies: Dg Chest 2 View  Result Date: 01/30/2016 CLINICAL DATA:  Sudden onset fever today EXAM: CHEST  2 VIEW COMPARISON:  Oct 04, 2015 FINDINGS: The heart size and mediastinal contours are within normal limits. The aorta is tortuous. There is no focal infiltrate, pulmonary edema, or pleural effusion. Chronic change of left upper ribs are stable. IMPRESSION: No active cardiopulmonary disease. Electronically Signed   By: Abelardo Diesel M.D.   On: 01/30/2016 21:39   US Renal  Result Date: 01/31/2016 CLINICAL DATA:  Acute  pyelonephritis, essential benign hypertension, type II diabetes mellitus EXAM: RENAL / URINARY TRACT ULTRASOUND COMPLETE COMPARISON:  Abdominal ultrasound 09/13/2014 FINDINGS: Right Kidney: Length: 12.3 cm. Normal cortical thickness and echogenicity. No mass, hydronephrosis or shadowing calcification.  No perinephric fluid. Left Kidney: Length: 13.0 cm. Normal cortical thickness and echogenicity. Shadowing echogenic focus 12 mm diameter at inferior pole question nonobstructing calculus. Bladder: Normal appearance Incidentally noted increased echogenicity of the liver, likely fatty infiltration though this can be seen with cirrhosis and certain infiltrative disorders. IMPRESSION: Question 12 mm nonobstructing calculus at inferior pole LEFT kidney. Otherwise unremarkable sonographic appearance of the kidneys. Question fatty infiltration of liver as above. Electronically Signed   By: Lavonia Dana M.D.   On: 01/31/2016 09:37        Scheduled Meds: . aspirin  81 mg Oral Daily  . cefTRIAXone (ROCEPHIN)  IV  2 g Intravenous Q24H  . insulin aspart  1-3 Units Subcutaneous Q4H  . ondansetron (ZOFRAN) IV  4 mg Intravenous Once  . pantoprazole  40 mg Oral Q1200  . sodium chloride  500 mL Intravenous Once   Continuous Infusions: . dilTIAZem HCl-Dextrose Stopped (01/31/16 0715)     LOS: 2 days    Time spent: 35 minutes    Elmarie Shiley, MD [facility] Pager [phone]  If 7PM-7AM, please contact night-coverage [email or web address] Password [email or web address] 02/01/2016, 10:12 AM

## 2016-02-02 LAB — URINE CULTURE

## 2016-02-02 LAB — BASIC METABOLIC PANEL
ANION GAP: 6 (ref 5–15)
BUN: 6 mg/dL (ref 6–20)
CO2: 25 mmol/L (ref 22–32)
Calcium: 8.1 mg/dL — ABNORMAL LOW (ref 8.9–10.3)
Chloride: 109 mmol/L (ref 101–111)
Creatinine, Ser: 0.65 mg/dL (ref 0.44–1.00)
GFR calc Af Amer: >60 mL/min (ref >60)
GLUCOSE: 112 mg/dL — AB (ref 65–99)
Potassium: 3.3 mmol/L — ABNORMAL LOW (ref 3.5–5.1)
Sodium: 140 mmol/L (ref 135–145)

## 2016-02-02 LAB — CBC
HEMATOCRIT: 36.2 % (ref 36.0–46.0)
Hemoglobin: 11.5 g/dL — ABNORMAL LOW (ref 12.0–15.0)
MCH: 26.9 pg (ref 26.0–34.0)
MCHC: 31.8 g/dL (ref 30.0–36.0)
MCV: 84.8 fL (ref 78.0–100.0)
PLATELETS: 114 10*3/uL — AB (ref 150–400)
RBC: 4.27 MIL/uL (ref 3.87–5.11)
RDW: 13.2 % (ref 11.5–15.5)
WBC: 3.3 10*3/uL — ABNORMAL LOW (ref 4.0–10.5)

## 2016-02-02 LAB — GLUCOSE, CAPILLARY
GLUCOSE-CAPILLARY: 100 mg/dL — AB (ref 65–99)
Glucose-Capillary: 103 mg/dL — ABNORMAL HIGH (ref 65–99)
Glucose-Capillary: 104 mg/dL — ABNORMAL HIGH (ref 65–99)
Glucose-Capillary: 125 mg/dL — ABNORMAL HIGH (ref 65–99)

## 2016-02-02 LAB — CULTURE, BLOOD (ROUTINE X 2)

## 2016-02-02 LAB — PHOSPHORUS: PHOSPHORUS: 3.1 mg/dL (ref 2.5–4.6)

## 2016-02-02 MED ORDER — SACCHAROMYCES BOULARDII 250 MG PO CAPS: 250.0000 mg | ORAL_CAPSULE | Freq: Two times a day (BID) | ORAL | 0 refills | Status: DC

## 2016-02-02 MED ORDER — POTASSIUM CHLORIDE CRYS ER 20 MEQ PO TBCR
40.0000 meq | EXTENDED_RELEASE_TABLET | Freq: Once | ORAL | Status: AC
  Administered 2016-02-02: 40 meq via ORAL
  Filled 2016-02-02: qty 2

## 2016-02-02 MED ORDER — CEPHALEXIN 500 MG PO CAPS: 500.0000 mg | ORAL_CAPSULE | Freq: Three times a day (TID) | ORAL | 0 refills | Status: DC

## 2016-02-02 NOTE — Consult Note (Signed)
I have been asked to see the patient by Dr. Niel Hummer, for evaluation and management of air in bladder.  History of present illness: Who presented to the emergency department 3 days prior with fever and chills.  Ultimately the patient decompensated into SVT and was found to be septic/bacteremic.  The patient has recovered and is doing well currently.  A CT scan was obtained yesterday to ensure the patient didn't develop an abscess from her previous hysterectomy procedure and was noted to have air in her bladder.  Despite the patient's lack of voiding symptoms she was noted to have both Klebsiella and Escherichia coli in her bladder.  Klebsiella was growing in her blood.  Presumably the source of her decompensation was via the urinary tract.  The patient is not complaining of dysuria or suprapubic pain.  She is not complaining of foul-smelling urine or gross hematuria.  She feels that she empties her bladder completely.  She denies constipation but rather has had some diarrhea which is her baseline.  She denies any feculent material in her urine or fecal incontinence.  Review of systems: A 12 point comprehensive review of systems was obtained and is negative unless otherwise stated in the history of present illness.  Patient Active Problem List   Diagnosis Date Noted  . PAF (paroxysmal atrial fibrillation) ([place]) 02/01/2016  . SVT (supraventricular tachycardia) ([place]) 01/31/2016  . UTI (lower urinary tract infection) 01/31/2016  . Essential hypertension 01/31/2016  . Diabetes mellitus type 2, controlled ([place]) 01/31/2016  . Acute pyelonephritis   . Pyelonephritis   . Sepsis ([place]) 01/30/2016  . Chest pain   . Hypoxemia   . Atelectasis   . 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 Post-menopausal bleeding 10/03/2015  . LGSIL (low grade squamous intraepithelial dysplasia) 08/05/2015  . PMB (postmenopausal bleeding) 08/05/2015  . Low back pain 07/27/2014  . Whiplash 06/04/2012   . Chest pain 09/19/2011  . PAC (premature atrial contraction) 09/19/2011  . 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 BACK PAIN WITH RADICULOPATHY 12/14/2009  . OBESITY 08/22/2009  . DIARRHEA 08/22/2009  . FULL INCONTINENCE OF FECES 08/22/2009    No current facility-administered medications on file prior to encounter.    Current Outpatient Prescriptions on File Prior to Encounter  Medication Sig Dispense Refill  . aspirin 325 MG tablet Take 325 mg by mouth daily.    . cholecalciferol (VITAMIN D) 1000 UNITS tablet Take 1,000 Units by mouth daily.    . cyclobenzaprine (FLEXERIL) 5 MG tablet Take 1 tablet (5 mg total) by mouth at bedtime as needed for muscle spasms. 30 tablet 2  . hydrochlorothiazide 25 MG tablet Take 25 mg by mouth daily. Once daily    . losartan (COZAAR) 50 MG tablet Take 50 mg by mouth daily.    . Magnesium 500 MG CAPS Take 1 capsule by mouth at bedtime.    . metFORMIN (GLUCOPHAGE) 500 MG tablet Take 1,000 mg by mouth 2 (two) times daily with a meal.    . metoprolol succinate (TOPROL-XL) 50 MG 24 hr tablet Take 50 mg by mouth daily. Take with or immediately following a meal.    . Omega-3 Fatty Acids (FISH OIL PO) Take 1 tablet by mouth daily.    . pantoprazole (PROTONIX) 40 MG tablet Take 40 mg by mouth daily as needed (FOR UPSET STOMACH).    Marland Kitchen simvastatin (ZOCOR) 40 MG tablet Take 40 mg by mouth every evening.    . traMADol Veatrice Bourbon)  50 MG tablet Take 1 tablet (50 mg total) by mouth every 4 (four) hours as needed for severe pain (start tramadol if patient has taken oral food with nausea). 30 tablet 0  . Turmeric 450 MG CAPS Take 900 mg by mouth daily.      Past Medical History:  Diagnosis Date  . 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 Arthritis   . Chronic depressive disorder   . Chronic edema    bilateral ankles  . Circadian rhythm sleep disorder   . Diabetes ([place])   . Dysrhythmia    PAC, PJC, managed well with toprolol  . Ganglion of knee    left   . GERD (gastroesophageal reflux disease)    rare  . Glucose intolerance (impaired glucose tolerance)   . HLA B27 (HLA B27 positive)   . Hyperlipidemia   . Hypertension   . IBS (irritable bowel syndrome)   . Ischemic colitis ([place])   . Kyphosis   . Lateral epicondylitis of right elbow   . Lymphadenopathy   . Migraine headache    history of  . Morbid obesity ([place])   . Onycholysis   . PAC (premature atrial contraction)   . Paronychia   . PMS (premenstrual syndrome)   . PONV (postoperative nausea and vomiting)   . Sacroiliitis ([place])   . Seasonal allergies   . Trigger ring finger of left hand   . Vitamin D deficiency     Past Surgical History:  Procedure Laterality Date  . abdominal laparascopic  92  . BLADDER SURGERY    . BREAST SURGERY  2003   bx lt-neg  . CERVICAL CONE BIOPSY    . CHOLECYSTECTOMY N/A 10/26/2014   Procedure: LAPAROSCOPIC CHOLECYSTECTOMY;  Surgeon: Coralie Keens, MD;  Location: [facility];  Service: General;  Laterality: N/A;  . COLONOSCOPY W/ BIOPSIES  multiple  . DIAGNOSTIC LAPAROSCOPY    . DILATION AND CURETTAGE OF UTERUS  07,11  . ESOPHAGOGASTRODUODENOSCOPY  multiple  . KNEE ARTHROSCOPY  07,10   left  . LAPAROSCOPIC VAGINAL HYSTERECTOMY WITH SALPINGO OOPHORECTOMY Bilateral 10/03/2015   Procedure: LAPAROSCOPIC ASSISTED VAGINAL HYSTERECTOMY WITH SALPINGECTOMY;  Surgeon: Eldred Manges, MD;  Location: [facility];  Service: Gynecology;  Laterality: Bilateral;  . left finger surgery    . MASS EXCISION Left 12/24/2012   Procedure: LEFT EXCISION OF PALMAR MASS X TWO;  Surgeon: Roseanne Kaufman, MD;  Location: [facility];  Service: Orthopedics;  Laterality: Left;    Social History  Substance Use Topics  . Smoking status: Never Smoker  . Smokeless tobacco: Never Used  . Alcohol use 0.0 oz/week     Comment: social    Family History  Problem Relation Age of Onset  . Heart attack Father   . Heart disease Father   . Coronary  artery disease Mother   . Hypertension Mother   . Heart disease Mother   . Heart attack Mother   . Hypertension Sister   . Hypertension Brother   . Colon cancer Neg Hx     PE: Vitals:   02/01/16 1324 02/01/16 2107 02/02/16 0455 02/02/16 1416  BP: 127/75 139/68 (!) 150/82 (!) 156/72  Pulse: 75 76 74 81  Resp: '18 18 18 20  ' Temp: 98.1 F (36.7 C) 98.8 F (37.1 C) 97.8 F (36.6 C) 98.7 F (37.1 C)  TempSrc: Oral Oral Axillary Oral  SpO2: 99% 98% 97% 97%  Weight:      Height:  Patient appears to be in no acute distress  patient is alert and oriented x3 Atraumatic normocephalic head No cervical or supraclavicular lymphadenopathy appreciated No increased work of breathing, no audible wheezes/rhonchi Regular sinus rhythm/rate Abdomen is soft, nontender, nondistended, no CVA or suprapubic tenderness Lower extremities are symmetric without appreciable edema Grossly neurologically intact No identifiable skin lesions   Recent Labs  01/31/16 0449 02/01/16 0451 02/02/16 0357  WBC 8.9 4.4 3.3*  HGB 12.9 12.5 11.5*  HCT 40.6 38.4 36.2    Recent Labs  01/31/16 0449 02/01/16 0451 02/02/16 0357  NA 138 140 140  K 3.3* 3.3* 3.3*  CL 107 110 109  CO2 '23 23 25  ' GLUCOSE 167* 103* 112*  BUN '9 10 6  ' CREATININE 0.91 0.64 0.65  CALCIUM 8.1* 7.9* 8.1*   No results for input(s): LABPT, INR in the last 72 hours. No results for input(s): LABURIN in the last 72 hours. Results for orders placed or performed during the hospital encounter of 01/30/16  Culture, blood (Routine x 2)     Status: Abnormal   Collection Time: 01/30/16  7:35 PM  Result Value Ref Range Status   Specimen Description BLOOD RIGHT ANTECUBITAL  Final   Special Requests BOTTLES DRAWN AEROBIC AND ANAEROBIC 5CC EACH  Final   Culture  Setup Time   Final    GRAM NEGATIVE RODS IN BOTH AEROBIC AND ANAEROBIC BOTTLES CRITICAL RESULT CALLED TO, READ BACK BY AND VERIFIED WITH: N. BATCHELDER, PHARMD AT 1402 ON  01/31/16 BY C. JESSUP, MLT. Performed at [facility] (A)  Final   Report Status 02/02/2016 FINAL  Final   Organism ID, Bacteria KLEBSIELLA PNEUMONIAE  Final      Susceptibility   Klebsiella pneumoniae - MIC*    AMPICILLIN >=32 RESISTANT Resistant     CEFAZOLIN <=4 SENSITIVE Sensitive     CEFEPIME <=1 SENSITIVE Sensitive     CEFTAZIDIME <=1 SENSITIVE Sensitive     CEFTRIAXONE <=1 SENSITIVE Sensitive     CIPROFLOXACIN <=0.25 SENSITIVE Sensitive     GENTAMICIN <=1 SENSITIVE Sensitive     IMIPENEM <=0.25 SENSITIVE Sensitive     TRIMETH/SULFA <=20 SENSITIVE Sensitive     AMPICILLIN/SULBACTAM 4 SENSITIVE Sensitive     PIP/TAZO <=4 SENSITIVE Sensitive     Extended ESBL NEGATIVE Sensitive     * KLEBSIELLA PNEUMONIAE  Blood Culture ID Panel (Reflexed)     Status: Abnormal   Collection Time: 01/30/16  7:35 PM  Result Value Ref Range Status   Enterococcus species NOT DETECTED NOT DETECTED Final   Listeria monocytogenes NOT DETECTED NOT DETECTED Final   Staphylococcus species NOT DETECTED NOT DETECTED Final   Staphylococcus aureus NOT DETECTED NOT DETECTED Final   Streptococcus species NOT DETECTED NOT DETECTED Final   Streptococcus agalactiae NOT DETECTED NOT DETECTED Final   Streptococcus pneumoniae NOT DETECTED NOT DETECTED Final   Streptococcus pyogenes NOT DETECTED NOT DETECTED Final   Acinetobacter baumannii NOT DETECTED NOT DETECTED Final   Enterobacteriaceae species DETECTED (A) NOT DETECTED Final    Comment: CRITICAL RESULT CALLED TO, READ BACK BY AND VERIFIED WITH: N. BATCHELDER, PHARMD AT 1402 ON 01/31/16 BY C. JESSUP, MLT.    Enterobacter cloacae complex NOT DETECTED NOT DETECTED Final   Escherichia coli NOT DETECTED NOT DETECTED Final   Klebsiella oxytoca NOT DETECTED NOT DETECTED Final   Klebsiella pneumoniae DETECTED (A) NOT DETECTED Final    Comment: CRITICAL RESULT CALLED TO, READ BACK BY AND VERIFIED  WITH: Karlene Einstein, PHARMD AT  1402 ON 01/31/16 BY C. JESSUP, MLT.    Proteus species NOT DETECTED NOT DETECTED Final   Serratia marcescens NOT DETECTED NOT DETECTED Final   Carbapenem resistance NOT DETECTED NOT DETECTED Final   Haemophilus influenzae NOT DETECTED NOT DETECTED Final   Neisseria meningitidis NOT DETECTED NOT DETECTED Final   Pseudomonas aeruginosa NOT DETECTED NOT DETECTED Final   Candida albicans NOT DETECTED NOT DETECTED Final   Candida glabrata NOT DETECTED NOT DETECTED Final   Candida krusei NOT DETECTED NOT DETECTED Final   Candida parapsilosis NOT DETECTED NOT DETECTED Final   Candida tropicalis NOT DETECTED NOT DETECTED Final    Comment: Performed at [facility]  Culture, blood (Routine x 2)     Status: Abnormal   Collection Time: 01/30/16  7:40 PM  Result Value Ref Range Status   Specimen Description BLOOD LEFT WRIST  Final   Special Requests BOTTLES DRAWN AEROBIC AND ANAEROBIC 5CC EACH  Final   Culture  Setup Time   Final    GRAM NEGATIVE RODS AEROBIC BOTTLE ONLY CRITICAL VALUE NOTED.  VALUE IS CONSISTENT WITH PREVIOUSLY REPORTED AND CALLED VALUE.    Culture (A)  Final    KLEBSIELLA PNEUMONIAE SUSCEPTIBILITIES PERFORMED ON PREVIOUS CULTURE WITHIN THE LAST 5 DAYS. Performed at [facility]    Report Status 02/02/2016 FINAL  Final  Urine culture     Status: Abnormal   Collection Time: 01/30/16  8:40 PM  Result Value Ref Range Status   Specimen Description URINE, RANDOM  Final   Special Requests NONE  Final   Culture (A)  Final    20,000 COLONIES/mL ESCHERICHIA COLI 30,000 COLONIES/mL KLEBSIELLA PNEUMONIAE    Report Status 02/02/2016 FINAL  Final   Organism ID, Bacteria ESCHERICHIA COLI (A)  Final   Organism ID, Bacteria KLEBSIELLA PNEUMONIAE (A)  Final      Susceptibility   Escherichia coli - MIC*    AMPICILLIN >=32 RESISTANT Resistant     CEFAZOLIN <=4 SENSITIVE Sensitive     CEFTRIAXONE <=1 SENSITIVE Sensitive     CIPROFLOXACIN 0.5 SENSITIVE Sensitive      GENTAMICIN >=16 RESISTANT Resistant     IMIPENEM <=0.25 SENSITIVE Sensitive     NITROFURANTOIN <=16 SENSITIVE Sensitive     TRIMETH/SULFA >=320 RESISTANT Resistant     AMPICILLIN/SULBACTAM 16 INTERMEDIATE Intermediate     PIP/TAZO <=4 SENSITIVE Sensitive     Extended ESBL NEGATIVE Sensitive     * 20,000 COLONIES/mL ESCHERICHIA COLI   Klebsiella pneumoniae - MIC*    AMPICILLIN >=32 RESISTANT Resistant     CEFAZOLIN <=4 SENSITIVE Sensitive     CEFTRIAXONE <=1 SENSITIVE Sensitive     CIPROFLOXACIN <=0.25 SENSITIVE Sensitive     GENTAMICIN <=1 SENSITIVE Sensitive     IMIPENEM <=0.25 SENSITIVE Sensitive     NITROFURANTOIN 64 INTERMEDIATE Intermediate     TRIMETH/SULFA <=20 SENSITIVE Sensitive     AMPICILLIN/SULBACTAM 4 SENSITIVE Sensitive     PIP/TAZO <=4 SENSITIVE Sensitive     Extended ESBL NEGATIVE Sensitive     * 30,000 COLONIES/mL KLEBSIELLA PNEUMONIAE  MRSA PCR Screening     Status: None   Collection Time: 01/31/16  3:18 AM  Result Value Ref Range Status   MRSA by PCR NEGATIVE NEGATIVE Final    Comment:        The GeneXpert MRSA Assay (FDA approved for NASAL specimens only), is one component of a comprehensive MRSA colonization surveillance program. It is not intended to  diagnose MRSA infection nor to guide or monitor treatment for MRSA infections.     Imaging: I have independently reviewed the patient's CT scanWhich is otherwise normal except for a small speck of air in her bladder.  Imp: The patient has a history of recurrent urinary tract infections and was noted to have a Klebsiella/Escherichia coli double urinary tract infection.  The etiology of her sepsis is clearly from the urinary tract although very atypical presentation.  She does empty her bladder well and she may just be susceptible to infections.  Currently she is stable without any significant symptoms.  The air in her bladder this is clinically insignificant.  Recommendations: The patient should be turned  on oral antibiotics 14 days.  I also suggested that she take to cranberry tablets twice daily as well as start I lactobacillus probiotic.  The patient has a urologist in [facility] can follow up with him.  Thank you for involving me in this patient's care, Please page with any further questions or concerns. Louis Meckel W

## 2016-02-02 NOTE — Progress Notes (Signed)
Alexandra Walker to be D/C'd Home per MD order. Discussed with the patient and all questions fully answered.    Medication List    STOP taking these medications   pantoprazole 40 MG tablet Commonly known as:  PROTONIX     TAKE these medications   aspirin 325 MG tablet Take 325 mg by mouth daily.   cephALEXin 500 MG capsule Commonly known as:  KEFLEX Take 1 capsule (500 mg total) by mouth 3 (three) times daily.   cholecalciferol 1000 units tablet Commonly known as:  VITAMIN D Take 1,000 Units by mouth daily.   cyclobenzaprine 5 MG tablet Commonly known as:  FLEXERIL Take 1 tablet (5 mg total) by mouth at bedtime as needed for muscle spasms.   FISH OIL PO Take 1 tablet by mouth daily.   hydrochlorothiazide 25 MG tablet Commonly known as:  HYDRODIURIL Take 25 mg by mouth daily. Once daily   losartan 50 MG tablet Commonly known as:  COZAAR Take 50 mg by mouth daily.   Magnesium 500 MG Caps Take 1 capsule by mouth at bedtime.   metFORMIN 500 MG tablet Commonly known as:  GLUCOPHAGE Take 1,000 mg by mouth 2 (two) times daily with a meal.   metoprolol succinate 50 MG 24 hr tablet Commonly known as:  TOPROL-XL Take 50 mg by mouth daily. Take with or immediately following a meal.   saccharomyces boulardii 250 MG capsule Commonly known as:  FLORASTOR Take 1 capsule (250 mg total) by mouth 2 (two) times daily.   simvastatin 40 MG tablet Commonly known as:  ZOCOR Take 40 mg by mouth every evening.   traMADol 50 MG tablet Commonly known as:  ULTRAM Take 1 tablet (50 mg total) by mouth every 4 (four) hours as needed for severe pain (start tramadol if patient has taken oral food with nausea).   Turmeric 450 MG Caps Take 900 mg by mouth daily.       VVS, Skin clean, dry and intact without evidence of skin break down, no evidence of skin tears noted.  IV catheter discontinued intact. Site without signs and symptoms of complications. Dressing and pressure applied.  An  After Visit Summary was printed and given to the patient.  Patient escorted via [facility], and D/C home via private auto.  Cyndra Numbers  02/02/2016 7:58 PM

## 2016-02-02 NOTE — Discharge Summary (Signed)
Physician Discharge Summary  LATRESSA BANGE [id number] DOB: 1955-11-04 DOA: 01/30/2016  PCP: Garlan Fillers, MD  Admit date: 01/30/2016 Discharge date: 02/02/2016  Admitted From: Home  Disposition:  Home   Recommendations for Outpatient Follow-up:  1. Follow up with PCP in 1-2 weeks 2. Please obtain BMP/CBC in one week to follow platelet  3. Follow up with cardiology for CT CA outpatient.     Discharge Condition: Stable.  CODE STATUS; full code.  Diet recommendation: Heart Healthy  Brief/Interim Summary: 60 year old female with PMH as below, which is significant for DM, HTN, Obesity, PAC and PJCs, and Anticardiolipin syndrome. She recently had some postmenopausal bleeding deemed to be the result of low grade squamous intraepithelial dysplasia. She underwent elective hysterectomy for this 09/2015. Since that time she has had several urinary tract infections with only symptoms being fevers. 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 There improved after taking some tylenol for fevers. Fevers worsened and she again developed rigors associated with muscle aching prompting her to present to [facility] ED. In ED urinalysis concerning for urinary tract infection despite no elevation in WBC. Fevers as high as 105F. She was transferred to [facility] for admission to medical floor where she was found to be in SVT. PCCM consulted for further evaluation   Sepsis secondary to urinary tract infection ABX as above, treat as complicated UTI as this is 3rd in past few months Follow cultures; urine grew E coli, and Klebsiella. Sensitive to ceftriaxone. Plan to discharge on keflex for 10 more days.  Renal US negative for pyelo.  CT only showed gas in the bladder.  Urology consulted. Recommended to continue Tx for UTI/   Klebsiella Bacteremia;  Treated with ceftriaxone, day 4.  Afebrile. Plan to discharge on Keflex for 10 more days    Supraventricular tachycardia > suspect in setting of fevers. 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 Mild elevation of troponin.  2 d Continue home simvastatin, metoprolol, cozaar, HCTZ, ASA Appreciate Dr Tamala Julian help, plan for CA CT outpatient.   DM Resume  Metformin at discharge  CBG monitoring and SSI  HTN; BP increasing. Resume Home medications.   Hypophosphatemia;  Replete orally. Resolved.   Mild thrombocytopenia; needs repeat labs. Suspect related to medications, infection.    Discharge Diagnoses:  Principal Problem:   Sepsis ([place]) Active Problems:   UTI (lower urinary tract infection)   Essential hypertension   Diabetes mellitus type 2, controlled ([place])   Acute pyelonephritis   Pyelonephritis   PAF (paroxysmal atrial fibrillation) [facility])    Discharge Instructions  Discharge Instructions    AMB Referral to [facility]    Complete by:  As directed    Please assign UMR member for post toc call. Currently at [facility] Packet provided. Marthenia Rolling, [place], RN,BSN [facility] [id number]   Reason for consult:  Please assign UMR  member for post toc   Diagnoses of:  Diabetes   Expected date of contact:  1-3 days (reserved for hospital discharges)   Diet - low sodium heart healthy    Complete by:  As directed    Increase activity slowly    Complete by:  As directed        Medication List    STOP taking these medications  pantoprazole 40 MG tablet Commonly known as:  PROTONIX     TAKE these medications   aspirin 325 MG tablet Take 325 mg by mouth daily.   cephALEXin 500 MG capsule Commonly known as:  KEFLEX Take 1 capsule (500 mg total) by mouth 3 (three) times daily.   cholecalciferol 1000 units tablet Commonly known as:  VITAMIN D Take 1,000 Units by mouth daily.   cyclobenzaprine  5 MG tablet Commonly known as:  FLEXERIL Take 1 tablet (5 mg total) by mouth at bedtime as needed for muscle spasms.   FISH OIL PO Take 1 tablet by mouth daily.   hydrochlorothiazide 25 MG tablet Commonly known as:  HYDRODIURIL Take 25 mg by mouth daily. Once daily   losartan 50 MG tablet Commonly known as:  COZAAR Take 50 mg by mouth daily.   Magnesium 500 MG Caps Take 1 capsule by mouth at bedtime.   metFORMIN 500 MG tablet Commonly known as:  GLUCOPHAGE Take 1,000 mg by mouth 2 (two) times daily with a meal.   metoprolol succinate 50 MG 24 hr tablet Commonly known as:  TOPROL-XL Take 50 mg by mouth daily. Take with or immediately following a meal.   saccharomyces boulardii 250 MG capsule Commonly known as:  FLORASTOR Take 1 capsule (250 mg total) by mouth 2 (two) times daily.   simvastatin 40 MG tablet Commonly known as:  ZOCOR Take 40 mg by mouth every evening.   traMADol 50 MG tablet Commonly known as:  ULTRAM Take 1 tablet (50 mg total) by mouth every 4 (four) hours as needed for severe pain (start tramadol if patient has taken oral food with nausea).   Turmeric 450 MG Caps Take 900 mg by mouth daily.       Allergies  Allergen Reactions  . Ace Inhibitors Other (See Comments)    coughing  . Oxycodone-Acetaminophen Nausea And Vomiting  . Percocet [Oxycodone-Acetaminophen] Nausea And Vomiting  . Vicodin [Hydrocodone-Acetaminophen] Nausea And Vomiting  . Victoza [Liraglutide] Other (See Comments)    pancreatitis    Consultations:  Urology  CCM  Cardiology    Procedures/Studies: Dg Chest 2 View  Result Date: 01/30/2016 CLINICAL DATA:  Sudden onset fever today EXAM: CHEST  2 VIEW COMPARISON:  Oct 04, 2015 FINDINGS: The heart size and mediastinal contours are within normal limits. The aorta is tortuous. There is no focal infiltrate, pulmonary edema, or pleural effusion. Chronic change of left upper ribs are stable. IMPRESSION: No active  cardiopulmonary disease. Electronically Signed   By: Abelardo Diesel M.D.   On: 01/30/2016 21:39   Ct Abdomen Pelvis W Contrast  Result Date: 02/01/2016 CLINICAL DATA:  RIGHT lower quadrant pain.  Hysterectomy June 2017. EXAM: CT ABDOMEN AND PELVIS WITH CONTRAST TECHNIQUE: Multidetector CT imaging of the abdomen and pelvis was performed using the standard protocol following bolus administration of intravenous contrast. CONTRAST:  151mL ISOVUE-300 IOPAMIDOL (ISOVUE-300) INJECTION 61% COMPARISON:  CT 04/19/2013 FINDINGS: Lower chest: Lung bases are clear. Hepatobiliary: Low-attenuation liver. No duct dilatation. Post cholecystectomy. Common bile duct normal caliber. Pancreas: Pancreas is normal. No ductal dilatation. No pancreatic inflammation. Spleen: Normal spleen Adrenals/urinary tract: Adrenal glands normal. Nonobstructing 2 mm calculus mid LEFT kidney. Larger 5 mm calculus lower pole LEFT kidney. No ureterolithiasis or obstructive uropathy. Small focus of gas within the bladder (image 80, series 201). Stomach/Bowel: Stomach, small-bowel and cecum normal. Appendix not identified. No periappendiceal inflammation. The colon and rectosigmoid colon normal. Vascular/Lymphatic: Abdominal aorta is normal caliber. There is no  retroperitoneal or periportal lymphadenopathy. No pelvic lymphadenopathy. Reproductive: Post hysterectomy anatomy. No pelvic abscess basilar free fluid the pelvis. Again tiny amount of gas the bladder. Other: No inguinal hernia or ventral hernia Musculoskeletal: No aggressive osseous lesion. IMPRESSION: 1. No pelvic abscess. Small free fluid the pelvis is favored physiologic. 2. Tiny focus of gas in the bladder. Recommend correlation with recent instrumentation. 3. Hepatic steatosis. 4. Nonobstructing LEFT renal calculi. Electronically Signed   By: Suzy Bouchard M.D.   On: 02/01/2016 20:11   US Renal  Result Date: 01/31/2016 CLINICAL DATA:  Acute pyelonephritis, essential benign  hypertension, type II diabetes mellitus EXAM: RENAL / URINARY TRACT ULTRASOUND COMPLETE COMPARISON:  Abdominal ultrasound 09/13/2014 FINDINGS: Right Kidney: Length: 12.3 cm. Normal cortical thickness and echogenicity. No mass, hydronephrosis or shadowing calcification. No perinephric fluid. Left Kidney: Length: 13.0 cm. Normal cortical thickness and echogenicity. Shadowing echogenic focus 12 mm diameter at inferior pole question nonobstructing calculus. Bladder: Normal appearance Incidentally noted increased echogenicity of the liver, likely fatty infiltration though this can be seen with cirrhosis and certain infiltrative disorders. IMPRESSION: Question 12 mm nonobstructing calculus at inferior pole LEFT kidney. Otherwise unremarkable sonographic appearance of the kidneys. Question fatty infiltration of liver as above. Electronically Signed   By: Lavonia Dana M.D.   On: 01/31/2016 09:37      Subjective: Feeling well, wants to go home.  She is afraid of taking cipro because of adverse effect tendon tear.   Discharge Exam: Vitals:   02/02/16 0455 02/02/16 1416  BP: (!) 150/82 (!) 156/72  Pulse: 74 81  Resp: 18 20  Temp: 97.8 F (36.6 C) 98.7 F (37.1 C)   Vitals:   02/01/16 1324 02/01/16 2107 02/02/16 0455 02/02/16 1416  BP: 127/75 139/68 (!) 150/82 (!) 156/72  Pulse: 75 76 74 81  Resp: 18 18 18 20   Temp: 98.1 F (36.7 C) 98.8 F (37.1 C) 97.8 F (36.6 C) 98.7 F (37.1 C)  TempSrc: Oral Oral Axillary Oral  SpO2: 99% 98% 97% 97%  Weight:      Height:        General: Pt is alert, awake, not in acute distress Cardiovascular: RRR, S1/S2 +, no rubs, no gallops Respiratory: CTA bilaterally, no wheezing, no rhonchi Abdominal: Soft, NT, ND, bowel sounds + Extremities: no edema, no cyanosis    The results of significant diagnostics from this hospitalization (including imaging, microbiology, ancillary and laboratory) are listed below for reference.     Microbiology: Recent Results  (from the past 240 hour(s))  Culture, blood (Routine x 2)     Status: Abnormal   Collection Time: 01/30/16  7:35 PM  Result Value Ref Range Status   Specimen Description BLOOD RIGHT ANTECUBITAL  Final   Special Requests BOTTLES DRAWN AEROBIC AND ANAEROBIC 5CC EACH  Final   Culture  Setup Time   Final    GRAM NEGATIVE RODS IN BOTH AEROBIC AND ANAEROBIC BOTTLES CRITICAL RESULT CALLED TO, READ BACK BY AND VERIFIED WITH: N. BATCHELDER, PHARMD AT 1402 ON 01/31/16 BY C. JESSUP, MLT. Performed at [place] (A)  Final   Report Status 02/02/2016 FINAL  Final   Organism ID, Bacteria KLEBSIELLA PNEUMONIAE  Final      Susceptibility   Klebsiella pneumoniae - MIC*    AMPICILLIN >=32 RESISTANT Resistant     CEFAZOLIN <=4 SENSITIVE Sensitive     CEFEPIME <=1 SENSITIVE Sensitive     CEFTAZIDIME <=1 SENSITIVE Sensitive  CEFTRIAXONE <=1 SENSITIVE Sensitive     CIPROFLOXACIN <=0.25 SENSITIVE Sensitive     GENTAMICIN <=1 SENSITIVE Sensitive     IMIPENEM <=0.25 SENSITIVE Sensitive     TRIMETH/SULFA <=20 SENSITIVE Sensitive     AMPICILLIN/SULBACTAM 4 SENSITIVE Sensitive     PIP/TAZO <=4 SENSITIVE Sensitive     Extended ESBL NEGATIVE Sensitive     * KLEBSIELLA PNEUMONIAE  Blood Culture ID Panel (Reflexed)     Status: Abnormal   Collection Time: 01/30/16  7:35 PM  Result Value Ref Range Status   Enterococcus species NOT DETECTED NOT DETECTED Final   Listeria monocytogenes NOT DETECTED NOT DETECTED Final   Staphylococcus species NOT DETECTED NOT DETECTED Final   Staphylococcus aureus NOT DETECTED NOT DETECTED Final   Streptococcus species NOT DETECTED NOT DETECTED Final   Streptococcus agalactiae NOT DETECTED NOT DETECTED Final   Streptococcus pneumoniae NOT DETECTED NOT DETECTED Final   Streptococcus pyogenes NOT DETECTED NOT DETECTED Final   Acinetobacter baumannii NOT DETECTED NOT DETECTED Final   Enterobacteriaceae species DETECTED (A) NOT DETECTED  Final    Comment: CRITICAL RESULT CALLED TO, READ BACK BY AND VERIFIED WITH: N. BATCHELDER, PHARMD AT 1402 ON 01/31/16 BY C. JESSUP, MLT.    Enterobacter cloacae complex NOT DETECTED NOT DETECTED Final   Escherichia coli NOT DETECTED NOT DETECTED Final   Klebsiella oxytoca NOT DETECTED NOT DETECTED Final   Klebsiella pneumoniae DETECTED (A) NOT DETECTED Final    Comment: CRITICAL RESULT CALLED TO, READ BACK BY AND VERIFIED WITH: N. BATCHELDER, PHARMD AT 1402 ON 01/31/16 BY C. JESSUP, MLT.    Proteus species NOT DETECTED NOT DETECTED Final   Serratia marcescens NOT DETECTED NOT DETECTED Final   Carbapenem resistance NOT DETECTED NOT DETECTED Final   Haemophilus influenzae NOT DETECTED NOT DETECTED Final   Neisseria meningitidis NOT DETECTED NOT DETECTED Final   Pseudomonas aeruginosa NOT DETECTED NOT DETECTED Final   Candida albicans NOT DETECTED NOT DETECTED Final   Candida glabrata NOT DETECTED NOT DETECTED Final   Candida krusei NOT DETECTED NOT DETECTED Final   Candida parapsilosis NOT DETECTED NOT DETECTED Final   Candida tropicalis NOT DETECTED NOT DETECTED Final    Comment: Performed at [facility]  Culture, blood (Routine x 2)     Status: Abnormal   Collection Time: 01/30/16  7:40 PM  Result Value Ref Range Status   Specimen Description BLOOD LEFT WRIST  Final   Special Requests BOTTLES DRAWN AEROBIC AND ANAEROBIC 5CC EACH  Final   Culture  Setup Time   Final    GRAM NEGATIVE RODS AEROBIC BOTTLE ONLY CRITICAL VALUE NOTED.  VALUE IS CONSISTENT WITH PREVIOUSLY REPORTED AND CALLED VALUE.    Culture (A)  Final    KLEBSIELLA PNEUMONIAE SUSCEPTIBILITIES PERFORMED ON PREVIOUS CULTURE WITHIN THE LAST 5 DAYS. Performed at [facility]    Report Status 02/02/2016 FINAL  Final  Urine culture     Status: Abnormal   Collection Time: 01/30/16  8:40 PM  Result Value Ref Range Status   Specimen Description URINE, RANDOM  Final   Special Requests NONE  Final   Culture  (A)  Final    20,000 COLONIES/mL ESCHERICHIA COLI 30,000 COLONIES/mL KLEBSIELLA PNEUMONIAE    Report Status 02/02/2016 FINAL  Final   Organism ID, Bacteria ESCHERICHIA COLI (A)  Final   Organism ID, Bacteria KLEBSIELLA PNEUMONIAE (A)  Final      Susceptibility   Escherichia coli - MIC*    AMPICILLIN >=32 RESISTANT Resistant  CEFAZOLIN <=4 SENSITIVE Sensitive     CEFTRIAXONE <=1 SENSITIVE Sensitive     CIPROFLOXACIN 0.5 SENSITIVE Sensitive     GENTAMICIN >=16 RESISTANT Resistant     IMIPENEM <=0.25 SENSITIVE Sensitive     NITROFURANTOIN <=16 SENSITIVE Sensitive     TRIMETH/SULFA >=320 RESISTANT Resistant     AMPICILLIN/SULBACTAM 16 INTERMEDIATE Intermediate     PIP/TAZO <=4 SENSITIVE Sensitive     Extended ESBL NEGATIVE Sensitive     * 20,000 COLONIES/mL ESCHERICHIA COLI   Klebsiella pneumoniae - MIC*    AMPICILLIN >=32 RESISTANT Resistant     CEFAZOLIN <=4 SENSITIVE Sensitive     CEFTRIAXONE <=1 SENSITIVE Sensitive     CIPROFLOXACIN <=0.25 SENSITIVE Sensitive     GENTAMICIN <=1 SENSITIVE Sensitive     IMIPENEM <=0.25 SENSITIVE Sensitive     NITROFURANTOIN 64 INTERMEDIATE Intermediate     TRIMETH/SULFA <=20 SENSITIVE Sensitive     AMPICILLIN/SULBACTAM 4 SENSITIVE Sensitive     PIP/TAZO <=4 SENSITIVE Sensitive     Extended ESBL NEGATIVE Sensitive     * 30,000 COLONIES/mL KLEBSIELLA PNEUMONIAE  MRSA PCR Screening     Status: None   Collection Time: 01/31/16  3:18 AM  Result Value Ref Range Status   MRSA by PCR NEGATIVE NEGATIVE Final    Comment:        The GeneXpert MRSA Assay (FDA approved for NASAL specimens only), is one component of a comprehensive MRSA colonization surveillance program. It is not intended to diagnose MRSA infection nor to guide or monitor treatment for MRSA infections.      Labs: BNP (last 3 results) No results for input(s): BNP in the last 8760 hours. Basic Metabolic Panel:  Recent Labs Lab 01/30/16 1935 01/31/16 0449 02/01/16 0451  02/02/16 0357 02/02/16 0710  NA 136 138 140 140  --   K 3.5 3.3* 3.3* 3.3*  --   CL 102 107 110 109  --   CO2 25 23 23 25   --   GLUCOSE 127* 167* 103* 112*  --   BUN 13 9 10 6   --   CREATININE 0.74 0.91 0.64 0.65  --   CALCIUM 9.3 8.1* 7.9* 8.1*  --   MG  --  1.3* 2.3  --   --   PHOS  --  1.2* 2.0*  --  3.1   Liver Function Tests:  Recent Labs Lab 01/30/16 1935  AST 30  ALT 30  ALKPHOS 76  BILITOT 0.8  PROT 7.0  ALBUMIN 4.0   No results for input(s): LIPASE, AMYLASE in the last 168 hours. No results for input(s): AMMONIA in the last 168 hours. CBC:  Recent Labs Lab 01/30/16 1935 01/31/16 0449 02/01/16 0451 02/02/16 0357  WBC 8.7 8.9 4.4 3.3*  NEUTROABS 8.2*  --   --   --   HGB 14.5 12.9 12.5 11.5*  HCT 43.0 40.6 38.4 36.2  MCV 83.7 85.8 85.3 84.8  PLT 207 153 123* 114*   Cardiac Enzymes:  Recent Labs Lab 01/31/16 0449 01/31/16 0909 01/31/16 1535  TROPONINI 0.23* 0.58* 0.73*   BNP: Invalid input(s): POCBNP CBG:  Recent Labs Lab 02/01/16 2031 02/02/16 0044 02/02/16 0449 02/02/16 0759 02/02/16 1124  GLUCAP 154* 103* 125* 100* 104*   D-Dimer No results for input(s): DDIMER in the last 72 hours. Hgb A1c No results for input(s): HGBA1C in the last 72 hours. Lipid Profile No results for input(s): CHOL, HDL, LDLCALC, TRIG, CHOLHDL, LDLDIRECT in the last 72 hours. Thyroid function studies  No results for input(s): TSH, T4TOTAL, T3FREE, THYROIDAB in the last 72 hours.  Invalid input(s): FREET3 Anemia work up No results for input(s): VITAMINB12, FOLATE, FERRITIN, TIBC, IRON, RETICCTPCT in the last 72 hours. Urinalysis    Component Value Date/Time   COLORURINE YELLOW 01/30/2016 2040   APPEARANCEUR CLEAR 01/30/2016 2040   LABSPEC 1.021 01/30/2016 2040   PHURINE 6.0 01/30/2016 2040   GLUCOSEU NEGATIVE 01/30/2016 2040   HGBUR SMALL (A) 01/30/2016 2040   BILIRUBINUR NEGATIVE 01/30/2016 2040   KETONESUR 15 (A) 01/30/2016 2040   PROTEINUR NEGATIVE  01/30/2016 2040   NITRITE POSITIVE (A) 01/30/2016 2040   LEUKOCYTESUR SMALL (A) 01/30/2016 2040   Sepsis Labs Invalid input(s): PROCALCITONIN,  WBC,  LACTICIDVEN Microbiology Recent Results (from the past 240 hour(s))  Culture, blood (Routine x 2)     Status: Abnormal   Collection Time: 01/30/16  7:35 PM  Result Value Ref Range Status   Specimen Description BLOOD RIGHT ANTECUBITAL  Final   Special Requests BOTTLES DRAWN AEROBIC AND ANAEROBIC 5CC EACH  Final   Culture  Setup Time   Final    GRAM NEGATIVE RODS IN BOTH AEROBIC AND ANAEROBIC BOTTLES CRITICAL RESULT CALLED TO, READ BACK BY AND VERIFIED WITH: N. BATCHELDER, PHARMD AT 1402 ON 01/31/16 BY C. JESSUP, MLT. Performed at [place] (A)  Final   Report Status 02/02/2016 FINAL  Final   Organism ID, Bacteria KLEBSIELLA PNEUMONIAE  Final      Susceptibility   Klebsiella pneumoniae - MIC*    AMPICILLIN >=32 RESISTANT Resistant     CEFAZOLIN <=4 SENSITIVE Sensitive     CEFEPIME <=1 SENSITIVE Sensitive     CEFTAZIDIME <=1 SENSITIVE Sensitive     CEFTRIAXONE <=1 SENSITIVE Sensitive     CIPROFLOXACIN <=0.25 SENSITIVE Sensitive     GENTAMICIN <=1 SENSITIVE Sensitive     IMIPENEM <=0.25 SENSITIVE Sensitive     TRIMETH/SULFA <=20 SENSITIVE Sensitive     AMPICILLIN/SULBACTAM 4 SENSITIVE Sensitive     PIP/TAZO <=4 SENSITIVE Sensitive     Extended ESBL NEGATIVE Sensitive     * KLEBSIELLA PNEUMONIAE  Blood Culture ID Panel (Reflexed)     Status: Abnormal   Collection Time: 01/30/16  7:35 PM  Result Value Ref Range Status   Enterococcus species NOT DETECTED NOT DETECTED Final   Listeria monocytogenes NOT DETECTED NOT DETECTED Final   Staphylococcus species NOT DETECTED NOT DETECTED Final   Staphylococcus aureus NOT DETECTED NOT DETECTED Final   Streptococcus species NOT DETECTED NOT DETECTED Final   Streptococcus agalactiae NOT DETECTED NOT DETECTED Final   Streptococcus pneumoniae NOT  DETECTED NOT DETECTED Final   Streptococcus pyogenes NOT DETECTED NOT DETECTED Final   Acinetobacter baumannii NOT DETECTED NOT DETECTED Final   Enterobacteriaceae species DETECTED (A) NOT DETECTED Final    Comment: CRITICAL RESULT CALLED TO, READ BACK BY AND VERIFIED WITH: N. BATCHELDER, PHARMD AT 1402 ON 01/31/16 BY C. JESSUP, MLT.    Enterobacter cloacae complex NOT DETECTED NOT DETECTED Final   Escherichia coli NOT DETECTED NOT DETECTED Final   Klebsiella oxytoca NOT DETECTED NOT DETECTED Final   Klebsiella pneumoniae DETECTED (A) NOT DETECTED Final    Comment: CRITICAL RESULT CALLED TO, READ BACK BY AND VERIFIED WITH: N. BATCHELDER, PHARMD AT 1402 ON 01/31/16 BY C. JESSUP, MLT.    Proteus species NOT DETECTED NOT DETECTED Final   Serratia marcescens NOT DETECTED NOT DETECTED Final   Carbapenem resistance NOT DETECTED NOT DETECTED Final  Haemophilus influenzae NOT DETECTED NOT DETECTED Final   Neisseria meningitidis NOT DETECTED NOT DETECTED Final   Pseudomonas aeruginosa NOT DETECTED NOT DETECTED Final   Candida albicans NOT DETECTED NOT DETECTED Final   Candida glabrata NOT DETECTED NOT DETECTED Final   Candida krusei NOT DETECTED NOT DETECTED Final   Candida parapsilosis NOT DETECTED NOT DETECTED Final   Candida tropicalis NOT DETECTED NOT DETECTED Final    Comment: Performed at [facility]  Culture, blood (Routine x 2)     Status: Abnormal   Collection Time: 01/30/16  7:40 PM  Result Value Ref Range Status   Specimen Description BLOOD LEFT WRIST  Final   Special Requests BOTTLES DRAWN AEROBIC AND ANAEROBIC 5CC EACH  Final   Culture  Setup Time   Final    GRAM NEGATIVE RODS AEROBIC BOTTLE ONLY CRITICAL VALUE NOTED.  VALUE IS CONSISTENT WITH PREVIOUSLY REPORTED AND CALLED VALUE.    Culture (A)  Final    KLEBSIELLA PNEUMONIAE SUSCEPTIBILITIES PERFORMED ON PREVIOUS CULTURE WITHIN THE LAST 5 DAYS. Performed at [facility]    Report Status 02/02/2016 FINAL   Final  Urine culture     Status: Abnormal   Collection Time: 01/30/16  8:40 PM  Result Value Ref Range Status   Specimen Description URINE, RANDOM  Final   Special Requests NONE  Final   Culture (A)  Final    20,000 COLONIES/mL ESCHERICHIA COLI 30,000 COLONIES/mL KLEBSIELLA PNEUMONIAE    Report Status 02/02/2016 FINAL  Final   Organism ID, Bacteria ESCHERICHIA COLI (A)  Final   Organism ID, Bacteria KLEBSIELLA PNEUMONIAE (A)  Final      Susceptibility   Escherichia coli - MIC*    AMPICILLIN >=32 RESISTANT Resistant     CEFAZOLIN <=4 SENSITIVE Sensitive     CEFTRIAXONE <=1 SENSITIVE Sensitive     CIPROFLOXACIN 0.5 SENSITIVE Sensitive     GENTAMICIN >=16 RESISTANT Resistant     IMIPENEM <=0.25 SENSITIVE Sensitive     NITROFURANTOIN <=16 SENSITIVE Sensitive     TRIMETH/SULFA >=320 RESISTANT Resistant     AMPICILLIN/SULBACTAM 16 INTERMEDIATE Intermediate     PIP/TAZO <=4 SENSITIVE Sensitive     Extended ESBL NEGATIVE Sensitive     * 20,000 COLONIES/mL ESCHERICHIA COLI   Klebsiella pneumoniae - MIC*    AMPICILLIN >=32 RESISTANT Resistant     CEFAZOLIN <=4 SENSITIVE Sensitive     CEFTRIAXONE <=1 SENSITIVE Sensitive     CIPROFLOXACIN <=0.25 SENSITIVE Sensitive     GENTAMICIN <=1 SENSITIVE Sensitive     IMIPENEM <=0.25 SENSITIVE Sensitive     NITROFURANTOIN 64 INTERMEDIATE Intermediate     TRIMETH/SULFA <=20 SENSITIVE Sensitive     AMPICILLIN/SULBACTAM 4 SENSITIVE Sensitive     PIP/TAZO <=4 SENSITIVE Sensitive     Extended ESBL NEGATIVE Sensitive     * 30,000 COLONIES/mL KLEBSIELLA PNEUMONIAE  MRSA PCR Screening     Status: None   Collection Time: 01/31/16  3:18 AM  Result Value Ref Range Status   MRSA by PCR NEGATIVE NEGATIVE Final    Comment:        The GeneXpert MRSA Assay (FDA approved for NASAL specimens only), is one component of a comprehensive MRSA colonization surveillance program. It is not intended to diagnose MRSA infection nor to guide or monitor treatment  for MRSA infections.      Time coordinating discharge: Over 30 minutes  SIGNED:   Elmarie Shiley, MD  [facility] 02/02/2016, 3:11 PM Pager [phone]  If 7PM-7AM,  please contact night-coverage [email or web address] Password [email or web address]

## 2016-02-05 ENCOUNTER — Telehealth: Payer: Self-pay | Admitting: Cardiovascular Disease

## 2016-02-05 ENCOUNTER — Ambulatory Visit (HOSPITAL_COMMUNITY): Admission: RE | Admit: 2016-02-05 | Payer: 59 | Source: Ambulatory Visit

## 2016-02-05 ENCOUNTER — Other Ambulatory Visit: Payer: Self-pay | Admitting: *Deleted

## 2016-02-05 NOTE — Telephone Encounter (Signed)
error 

## 2016-02-05 NOTE — Patient Outreach (Signed)
[facility]) Care Management  02/05/2016  Alexandra Walker Oct 17, 1955 [id number]   Subjective: Telephone call to patient's home number, spoke with patient, and HIPAA verified.   Discussed [facility] Care Management services.   Patient states she is currently having dinner and request call back on 02/06/16.    Objective: Per chart review: Patient hospitalized 01/30/16  - 02/02/16 with sepsis secondary to urinary tract infection.   Patient hospitalized  10/03/15 - 10/04/15 with Post menopausal Bleeding.  Status post Laparoscopically Assisted Vaginal Hysterectomy with Bilateral Salpingectomy on 10/03/15.   Patient also has a history of : LGSIL (low grade squamous intraepithelial dysplasia), diabetes, hypertension,  Pyelonephritis, Anticardiolipin syndrome, and   PAF (paroxysmal atrial fibrillation).    Assessment: Received UMR Transition of care referral on 02/01/16.   Telephone screen/ transition of care follow up,  pending patient contact.    Plan: RNCM will call patient for 2nd telephone outreach attempt, telephone screen / transition of care follow up, within 10 business days, if no return call.    Alexandra Daily H. Annia Friendly, BSN, [place] Management [facility] Telephonic CM Phone: [phone] Fax: [phone]

## 2016-02-06 ENCOUNTER — Other Ambulatory Visit: Payer: Self-pay | Admitting: *Deleted

## 2016-02-06 ENCOUNTER — Ambulatory Visit: Payer: Self-pay | Admitting: *Deleted

## 2016-02-06 ENCOUNTER — Encounter: Payer: Self-pay | Admitting: *Deleted

## 2016-02-06 NOTE — Patient Outreach (Addendum)
[facility]) Care Management  02/06/2016  TZIPORA PENTICO [birth year] [id number]   Subjective: Telephone call to patient's home number, spoke with patient, and HIPAA verified.   Discussed [facility] Care Management UMR Transition of care follow up, Link to Wellness, care management services, and patient in agreement to complete telephone screen / transition of care follow up.   Patient states she is doing much better and still very tired. RNCM encouraged patient to take her activities slowly and obtain appropriate rest.   Patient voices understanding and is in agreement.   Returned to work on 02/04/16 and works a Geologist, engineering at [employer]. States her first day back to work went well.   Patient had a question regarding length of antibiotic treatment.   States the discharge instructions stated 2 weeks and the prescription was only written for 10 days.   RNCM advised patient to call discharging MD ([facility]) through [facility] operator, to verify length of antibiotics,  patient voices understanding, and states she will follow up.   States she spoke with her primary MD while she was in the hospital and will call to set up a office hospital follow up visit.  Has an appointment with urologist on 02/15/16 and cardiologist on 02/13/16.   States she has accessed the following [employer] Employee Benefits: Research officer, political party, supplemental insurance (Accident policy), and  live well program.  RNCM educated patient on hospital indemnity supplemental insurance, Link to [employer], and [facility] nutritional counseling benefit.  Patient  voices understanding and states she will follow up to verify if she has the hospital supplemental insurance.   States she will also access other employee benefits as needed.  Patient stated information was very helpful, appreciative of the call,  and this could really help pay for some of her hospital  bills. Patient states she does not have any transition of care, care coordination, disease management, disease monitoring, transportation, community resource, or pharmacy needs at this time. Patient in agreement to receive successful outreach letter, and states she already has Link to [employer].    Objective: Per chart review: Patient hospitalized 01/30/16  - 02/02/16 with sepsis secondary to urinary tract infection.   Patient hospitalized  10/03/15 - 10/04/15 with Post menopausal Bleeding.  Status post Laparoscopically Assisted Vaginal Hysterectomy with Bilateral Salpingectomy on 10/03/15.   Patient also has a history of : LGSIL (low grade squamous intraepithelial dysplasia), diabetes, hypertension,  Pyelonephritis, Anticardiolipin syndrome, and PAF (paroxysmal atrial fibrillation).    Assessment: Received UMR Transition of care referral on 02/01/16.   Telephone screen/ transition of care follow up completed and no care management at this time.   Will proceed with case closure.     Plan: RNCM will send patient successful outreach letter. RNCM will send case closure due to follow up completed / no care management needs.    Murvin Gift H. Annia Friendly, BSN, [place] Management [facility] Telephonic CM Phone: [phone] Fax: [phone]

## 2016-02-12 ENCOUNTER — Ambulatory Visit (HOSPITAL_COMMUNITY)
Admission: RE | Admit: 2016-02-12 | Discharge: 2016-02-12 | Disposition: A | Discharge status: Home / Self Care | Payer: 59 | Source: Ambulatory Visit | Attending: Cardiovascular Disease | Admitting: Cardiovascular Disease

## 2016-02-12 ENCOUNTER — Encounter: Payer: Self-pay | Admitting: Physician Assistant

## 2016-02-12 ENCOUNTER — Ambulatory Visit: Payer: 59 | Admitting: Cardiovascular Disease

## 2016-02-12 DIAGNOSIS — R072 Precordial pain: Secondary | ICD-10-CM | POA: Insufficient documentation

## 2016-02-12 DIAGNOSIS — R778 Other specified abnormalities of plasma proteins: Secondary | ICD-10-CM | POA: Insufficient documentation

## 2016-02-12 DIAGNOSIS — K760 Fatty (change of) liver, not elsewhere classified: Secondary | ICD-10-CM | POA: Diagnosis not present

## 2016-02-12 DIAGNOSIS — E876 Hypokalemia: Secondary | ICD-10-CM | POA: Insufficient documentation

## 2016-02-12 DIAGNOSIS — R7989 Other specified abnormal findings of blood chemistry: Secondary | ICD-10-CM

## 2016-02-12 DIAGNOSIS — K449 Diaphragmatic hernia without obstruction or gangrene: Secondary | ICD-10-CM | POA: Diagnosis not present

## 2016-02-12 HISTORY — DX: Sepsis, unspecified organism: A41.9

## 2016-02-12 HISTORY — DX: Tubulo-interstitial nephritis, not specified as acute or chronic: N12

## 2016-02-12 HISTORY — DX: Paroxysmal atrial fibrillation: I48.0

## 2016-02-12 HISTORY — DX: Supraventricular tachycardia: I47.1

## 2016-02-12 MED ORDER — METOPROLOL TARTRATE 5 MG/5ML IV SOLN
INTRAVENOUS | Status: AC
  Filled 2016-02-12: qty 5

## 2016-02-12 MED ORDER — METOPROLOL TARTRATE 5 MG/5ML IV SOLN
5.0000 mg | INTRAVENOUS | Status: DC | PRN
  Administered 2016-02-12: 2.5 mg via INTRAVENOUS

## 2016-02-12 MED ORDER — NITROGLYCERIN 0.4 MG SL SUBL
SUBLINGUAL_TABLET | SUBLINGUAL | Status: AC
  Filled 2016-02-12: qty 2

## 2016-02-12 MED ORDER — IOPAMIDOL (ISOVUE-370) INJECTION 76%
INTRAVENOUS | Status: AC
  Administered 2016-02-12: 80 mL
  Filled 2016-02-12: qty 100

## 2016-02-12 MED ORDER — NITROGLYCERIN 0.4 MG SL SUBL
0.8000 mg | SUBLINGUAL_TABLET | SUBLINGUAL | Status: DC | PRN
  Administered 2016-02-12: 0.8 mg via SUBLINGUAL

## 2016-02-12 NOTE — Progress Notes (Signed)
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  She has h/o negative stress echo in 2003, 2005 and a normal stress myoview in 2009.  2D echo 09/2015 showed EF 60-65%, normal wall motion, a mildly dilated LA but no significant valvular disease. She was recently seen by Dr. Angelena Form in clinic 01/11/16 for atypical right sided chest pain and occasional dyspnea. A Coronary CT was recommended to exclude CAD which is still pending. She was admitted 01/2016 with high fever, lactic acidosis, electrolyte derangement wiht hypokalemia and hypomagnesemia, and severe sepsis with likely source being UTI/ Pyelonephritis. (Lytes while not in sepsis were OK.) While on telemetry she was  observed to go into SVT with WCT requiring adenosine, metoprolol then Cardizem drip (V rates in the 190s). There were reports that after adenosine, when rate slowed, there was visible afib. She ultimately converted to NSR with Cardizem. She was completely asymptomatic with her arrhythmias. As the duration of her reported afib was <48 hours occurring in the setting of acute infection there was no indication for anticoagulation. Troponin peak of 0.73 felt 2/2 demand ischemia. The cardiology team recommended to keep f/u CTA as planned. Cardiac CTA was done yesterday 02/12/16 showing calcium score of zero, normal coronary origin with right dominance, no evidence of CAD, small hiatal hernia, hepatic steatosis (which patient is aware of).  She  presents back to clinic feeling well. No further chest pain. No dyspnea. She ate [place] food which was high in salt last night and subsequently has mild ankle edema which she's had in the past with salt loads. She denies any palpitations.   Past Medical History:  Diagnosis Date  . 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[place])   . Arthritis   . Chronic depressive disorder   . Chronic edema    bilateral ankles  . Circadian rhythm sleep disorder   . Diabetes ([place])   . Ganglion of knee    left  . GERD (gastroesophageal reflux disease)    rare  . Glucose intolerance (impaired glucose tolerance)   . Hepatic steatosis   . HLA B27 (HLA B27 positive)   . Hyperlipidemia   . Hypertension   . Hypokalemia   . Hypomagnesemia   . IBS (irritable bowel syndrome)   . Ischemic colitis ([place])   . Kyphosis   . Lateral epicondylitis of right elbow   . Lymphadenopathy   . Migraine headache    history of  . Morbid obesity ([place])   . Onycholysis   . PAC (premature atrial contraction)    PAC, PJC, managed well with BB  . Paronychia   . Paroxysmal atrial fibrillation ([place])    a. 01/2016 - brief episode during adm for severe sepsis.  Marland Kitchen PMS (premenstrual syndrome)   . PONV (postoperative nausea and vomiting)   . Pyelonephritis 01/2016  . Sacroiliitis ([place])   . Seasonal allergies   . Sepsis ([place]) 01/2016  . SVT (supraventricular tachycardia) ([place])    a. 01/2016 - SVT, wide complex tachycardia, and brief afib in setting of severe sepsis.  . Trigger  ring finger of left hand   . Vitamin D deficiency     Past Surgical History:  Procedure Laterality Date  . abdominal laparascopic  92  . BLADDER SURGERY    . BREAST SURGERY  2003   bx lt-neg  . CERVICAL CONE BIOPSY    . CHOLECYSTECTOMY N/A 10/26/2014   Procedure: LAPAROSCOPIC CHOLECYSTECTOMY;  Surgeon: Coralie Keens, MD;  Location: [facility];  Service: General;  Laterality: N/A;  . COLONOSCOPY W/ BIOPSIES  multiple  . DIAGNOSTIC LAPAROSCOPY      . DILATION AND CURETTAGE OF UTERUS  07,11  . ESOPHAGOGASTRODUODENOSCOPY  multiple  . KNEE ARTHROSCOPY  07,10   left  . LAPAROSCOPIC VAGINAL HYSTERECTOMY WITH SALPINGO OOPHORECTOMY Bilateral 10/03/2015   Procedure: LAPAROSCOPIC ASSISTED VAGINAL HYSTERECTOMY WITH SALPINGECTOMY;  Surgeon: Eldred Manges, MD;  Location: [facility];  Service: Gynecology;  Laterality: Bilateral;  . left finger surgery    . MASS EXCISION Left 12/24/2012   Procedure: LEFT EXCISION OF PALMAR MASS X TWO;  Surgeon: Roseanne Kaufman, MD;  Location: [place];  Service: Orthopedics;  Laterality: Left;    Current Medications: Current Outpatient Prescriptions  Medication Sig Dispense Refill  . aspirin 325 MG tablet Take 325 mg by mouth daily.    . cephALEXin (KEFLEX) 500 MG capsule Take 1 capsule (500 mg total) by mouth 3 (three) times daily. 30 capsule 0  . cholecalciferol (VITAMIN D) 1000 UNITS tablet Take 1,000 Units by mouth daily.    . cyclobenzaprine (FLEXERIL) 5 MG tablet Take 1 tablet (5 mg total) by mouth at bedtime as needed for muscle spasms. 30 tablet 2  . hydrochlorothiazide 25 MG tablet Take 25 mg by mouth daily. Once daily    . losartan (COZAAR) 50 MG tablet Take 50 mg by mouth daily.    . Magnesium 500 MG CAPS Take 1 capsule by mouth at bedtime.    . metFORMIN (GLUCOPHAGE) 500 MG tablet Take 1,000 mg by mouth 2 (two) times daily with a meal.    . metoprolol succinate (TOPROL-XL) 50 MG 24 hr tablet Take 50 mg by mouth daily. Take with or immediately following a meal.    . Omega-3 Fatty Acids (FISH OIL PO) Take 1 tablet by mouth daily.    Marland Kitchen saccharomyces boulardii (FLORASTOR) 250 MG capsule Take 1 capsule (250 mg total) by mouth 2 (two) times daily. 60 capsule 0  . simvastatin (ZOCOR) 40 MG tablet Take 40 mg by mouth every evening.    . traMADol (ULTRAM) 50 MG tablet Take 1 tablet (50 mg total) by mouth every 4 (four) hours as needed for severe pain (start tramadol if patient has taken oral food  with nausea). 30 tablet 0  . Turmeric 450 MG CAPS Take 900 mg by mouth daily.     No current facility-administered medications for this visit.      Allergies:   Ace inhibitors; Oxycodone-acetaminophen; Percocet [oxycodone-acetaminophen]; Vicodin [hydrocodone-acetaminophen]; and Victoza [liraglutide]   Social History   Social History  . Marital status: Divorced    Spouse name: N/A  . Number of children: 1  . Years of education: N/A   Occupational History  . [employer]   Social History Main Topics  . Smoking status: Never Smoker  . Smokeless tobacco: Never Used  . Alcohol use 0.0 oz/week     Comment: social  . Drug use: No  . Sexual activity: Not on file   Other Topics Concern  . Not on file  Social History Narrative   Nurse midwife with the [employer]     Family History:  The patient's family history includes Coronary artery disease in her mother; Heart attack in her father and mother; Heart disease in her father and mother; Hypertension in her brother, mother, and sister.   ROS:   Please see the history of present illness.  All other systems are reviewed and otherwise negative.    PHYSICAL EXAM:   VS:  BP 130/90   Pulse 72   Ht '5\' 7"'  (1.702 m)   Wt 254 lb 9.6 oz (115.5 kg)   SpO2 96%   BMI 39.88 kg/m   BMI: Body mass index is 39.88 kg/m. GEN: Well nourished, well developed obese WF in no acute distress  HEENT: normocephalic, atraumatic Neck: no JVD, carotid bruits, or masses Cardiac: RRR; no murmurs, rubs, or gallops, no edema  Respiratory:  clear to auscultation bilaterally, normal work of breathing GI: soft, nontender, nondistended, + BS MS: no deformity or atrophy  Skin: warm and dry, no rash Neuro:  Alert and Oriented x 3, Strength and sensation are intact, follows commands Psych: euthymic mood, full affect  Wt Readings from Last 3 Encounters:  02/13/16 254 lb 9.6 oz (115.5 kg)  01/31/16 258 lb 6.1 oz (117.2 kg)    01/11/16 254 lb (115.2 kg)      Studies/Labs Reviewed:   EKG:  The patient declined to have EKG today.  Recent Labs: 01/30/2016: ALT 30 02/01/2016: Magnesium 2.3 02/02/2016: BUN 6; Creatinine, Ser 0.65; Hemoglobin 11.5; Platelets 114; Potassium 3.3; Sodium 140   Lipid Panel No results found for: CHOL, TRIG, HDL, CHOLHDL, VLDL, LDLCALC, LDLDIRECT  Additional studies/ records that were reviewed today include: Summarized above.    ASSESSMENT & PLAN:   1. SVT/ paroxysmal atrial fib during recent stay - no evidence of recurrence by exam. She was not anticoagulated due to brief nature of arrhythmia and the setting in which it occurred (sepsis). She was asymptomatic with this at the time her HR was 190. CHADSVASC = 3. Will place 30 day event monitor for surveillance of silent AF. If no evidence of recurrence would continue to monitor. 2. Elevated troponin - no evidence of CAD by CTA. We discussed taking 73m instead of 3296mbut she takes the higher dose for anti-inflammatory properties. Lipids followed by PCP. 3. Hyperlipidemia - followed by PCP. She is maintained on simvastatin.  Disposition: F/u with Dr. McAngelena Formn 4 months, sooner if recurrent AF or SVT detected.   Medication Adjustments/Labs and Tests Ordered: Current medicines are reviewed at length with the patient today.  Concerns regarding medicines are outlined above. Medication changes, Labs and Tests ordered today are summarized above and listed in the Patient Instructions accessible in Encounters.   SiRaechel AcheA-C  02/13/2016 8:54 AM    [place] [place]  [id number]: [phone]: [phone]

## 2016-02-13 ENCOUNTER — Encounter: Payer: Self-pay | Admitting: Physician Assistant

## 2016-02-13 ENCOUNTER — Ambulatory Visit (INDEPENDENT_AMBULATORY_CARE_PROVIDER_SITE_OTHER): Payer: 59 | Admitting: Physician Assistant

## 2016-02-13 VITALS — BP 130/90 | HR 72 | Ht 67.0 in | Wt 254.6 lb

## 2016-02-13 DIAGNOSIS — R778 Other specified abnormalities of plasma proteins: Secondary | ICD-10-CM

## 2016-02-13 DIAGNOSIS — E785 Hyperlipidemia, unspecified: Secondary | ICD-10-CM

## 2016-02-13 DIAGNOSIS — I471 Supraventricular tachycardia: Secondary | ICD-10-CM | POA: Diagnosis not present

## 2016-02-13 DIAGNOSIS — I480 Paroxysmal atrial fibrillation: Secondary | ICD-10-CM | POA: Diagnosis not present

## 2016-02-13 DIAGNOSIS — R7989 Other specified abnormal findings of blood chemistry: Secondary | ICD-10-CM | POA: Diagnosis not present

## 2016-02-13 MED FILL — HYDROCHLOROTHIAZIDE 25 MG T: 25 | 90 days supply | Qty: 90 | Fill #2

## 2016-02-13 MED FILL — CYCLOBENZAPRINE 5 MG TABLET: 5 | 30 days supply | Qty: 30 | Fill #0

## 2016-02-13 NOTE — Patient Instructions (Signed)
Medication Instructions:  None  Labwork: None  Testing/Procedures: Your physician has recommended that you wear an event monitor. Event monitors are medical devices that record the heart's electrical activity. Doctors most often [name] these monitors to diagnose arrhythmias. Arrhythmias are problems with the speed or rhythm of the heartbeat. The monitor is a small, portable device. You can wear one while you do your normal daily activities. This is usually used to diagnose what is causing palpitations/syncope (passing out).    Follow-Up: Your physician recommends that you schedule a follow-up appointment in: 4 months with Dr. Angelena Form.   Any Other Special Instructions Will Be Listed Below (If Applicable).     If you need a refill on your cardiac medications before your next appointment, please call your pharmacy.

## 2016-02-15 DIAGNOSIS — N12 Tubulo-interstitial nephritis, not specified as acute or chronic: Secondary | ICD-10-CM | POA: Diagnosis not present

## 2016-02-15 DIAGNOSIS — R319 Hematuria, unspecified: Secondary | ICD-10-CM | POA: Diagnosis not present

## 2016-02-15 DIAGNOSIS — N390 Urinary tract infection, site not specified: Secondary | ICD-10-CM | POA: Diagnosis not present

## 2016-02-19 ENCOUNTER — Ambulatory Visit (INDEPENDENT_AMBULATORY_CARE_PROVIDER_SITE_OTHER): Payer: 59 | Admitting: Nurse Practitioner

## 2016-02-19 ENCOUNTER — Encounter: Payer: Self-pay | Admitting: Nurse Practitioner

## 2016-02-19 VITALS — BP 132/78 | HR 78 | Ht 65.75 in | Wt 254.0 lb

## 2016-02-19 DIAGNOSIS — K760 Fatty (change of) liver, not elsewhere classified: Secondary | ICD-10-CM | POA: Diagnosis not present

## 2016-02-19 MED FILL — SULFAMETHOXAZOLE-TMP DS TAB: 800-160 | 7 days supply | Qty: 14 | Fill #0

## 2016-02-19 MED FILL — PREMARIN VAGINAL CREAM-APPL: 0.625 | 84 days supply | Qty: 30 | Fill #0

## 2016-02-19 NOTE — Patient Instructions (Signed)
We will review the labs once they are routed to [name] in the system from your Primary Doctor.

## 2016-02-19 NOTE — Progress Notes (Addendum)
Addendum: Lipid labs received. Patient has hepatic steatosis, at visit we discussed importance of managing lipids, specifically triglycerides. Lipid panel 12/15/15 shows cholesterol normal at 161, HDL normal at 45, LDL normal at 105, triglycerides normal at 133. 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 Patient was recently hospitalized with pyelonephritis/sepsis. She is still taking antibiotics under the direction of urology.   Chief complaint:  wants to discuss fatty liver findings on CT scan done while hospitalized with pyelonephritis.   Patient told 20 years ago that she had fatty liver. Her liver function studies have reportedly always been normal (except when she had drug-induced pancreatitis). He rarely consumes alcohol. She has gained excessive weight over the last several years but her weight has been stable for the last few years. She has just joined a gym in an effort to lose weight. There is no family history of liver disease. Patient takes Zocor. Takes metformin and reports near normal A1c   Past Medical History:  Diagnosis Date  . 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[place])   . Arthritis   . Chronic depressive disorder   . Chronic edema    bilateral ankles  . Circadian rhythm sleep disorder   . Diabetes ([place])   . Ganglion of knee    left  . GERD (gastroesophageal reflux disease)    rare  . Glucose intolerance (impaired glucose tolerance)   . Hepatic steatosis   . HLA B27 (HLA B27 positive)   . Hyperlipidemia   . Hypertension   . Hypokalemia   . Hypomagnesemia   . IBS (irritable bowel syndrome)   . Ischemic colitis ([place])   . Kyphosis   . Lateral epicondylitis of right elbow   . Lymphadenopathy   . Migraine headache    history of  . Morbid obesity ([place])   . Onycholysis   . PAC (premature atrial contraction)    PAC, PJC, managed well with BB  . Paronychia   . Paroxysmal  atrial fibrillation ([place])    a. 01/2016 - brief episode during adm for severe sepsis.  Marland Kitchen PMS (premenstrual syndrome)   . PONV (postoperative nausea and vomiting)   . Pyelonephritis 01/2016  . Sacroiliitis ([place])   . Seasonal allergies   . Sepsis ([place]) 01/2016  . SVT (supraventricular tachycardia) ([place])    a. 01/2016 - SVT, wide complex tachycardia, and brief afib in setting of severe sepsis.  . Trigger ring finger of left hand   . Vitamin D deficiency     Patient's surgical history, family medical history, social history, and allergies were all reviewed in Epic    Physical Exam: BP 132/78   Pulse 78   Ht 5' 5.75" (1.67 m)   Wt 254 lb (115.2 kg)   SpO2 98%   BMI 41.31 kg/m    ASSESSMENT  62. 60 year old female with hepatic steatosis by imaging which she is here to discuss.   LFTs have reportedly always been normal. During a recent hospital admission for pyelonephritis there was an over read interpretation a chest CT which reported moderate to severe diffuse hepatic steatosis. Patient has numerous questions about findings. She also wanted to know if the steatosis has progressed over the years or if the recent scan was just the only one to describe the degree of steatosis .   PLAN:  We can review previous CTscans and attempt to compare liver findings. Normal LFTs through  the years is reassuring. Probably has NAFLD. Ultrasound elastography could detect presence of fibrosis but may be low yield if NASH not suspected, in which case liver biopsy likely not warranted either (will discuss further with her primary GI Dr. Gessner)   We discussed risk factors and treatment of fatty liver disease. Fortunately patient rarely consumes alcohol. She does have type 2 diabetes but reports that this is very well controlled on Metformin. On a statin, she will have her most recent lipid profile sent to me for review. Her biggest hurdle right now is that of obesity. She has joined a gym in an effort to lose  weight.   I spent 15 minutes of face-to-face time with the patient. Greater than 50% of the time was spent counseling and coordinating care.     

## 2016-02-21 ENCOUNTER — Encounter: Payer: Self-pay | Admitting: Internal Medicine

## 2016-02-21 NOTE — Progress Notes (Signed)
Note and chart reviewed. Agree with Ms. Chester Holstein.  No good way to tell changes in the steatosis with studies she has had. She had thrombocytopenia recently - new - ? Due to illness Need to repeat the PLT count in 1-2 months to make sure it normalizes If it doesn't could be a marker of fibrosis, etc and would consider elastography vs biopsy   Gatha Mayer, MD, [facility]

## 2016-02-22 ENCOUNTER — Ambulatory Visit (INDEPENDENT_AMBULATORY_CARE_PROVIDER_SITE_OTHER): Payer: 59

## 2016-02-22 DIAGNOSIS — I480 Paroxysmal atrial fibrillation: Secondary | ICD-10-CM

## 2016-02-24 DIAGNOSIS — I480 Paroxysmal atrial fibrillation: Secondary | ICD-10-CM | POA: Diagnosis not present

## 2016-02-29 DIAGNOSIS — N390 Urinary tract infection, site not specified: Secondary | ICD-10-CM | POA: Diagnosis not present

## 2016-03-05 DIAGNOSIS — M479 Spondylosis, unspecified: Secondary | ICD-10-CM | POA: Diagnosis not present

## 2016-03-05 DIAGNOSIS — E119 Type 2 diabetes mellitus without complications: Secondary | ICD-10-CM | POA: Diagnosis not present

## 2016-03-05 DIAGNOSIS — I10 Essential (primary) hypertension: Secondary | ICD-10-CM | POA: Diagnosis not present

## 2016-03-05 MED FILL — SULFAMETHOXAZOLE/TMP DS TAB: 800-160 | 30 days supply | Qty: 30 | Fill #0

## 2016-03-06 ENCOUNTER — Encounter: Payer: Self-pay | Admitting: Skilled Nursing Facility1

## 2016-03-06 ENCOUNTER — Encounter: Payer: 59 | Attending: Family Medicine | Admitting: Skilled Nursing Facility1

## 2016-03-06 DIAGNOSIS — Z713 Dietary counseling and surveillance: Secondary | ICD-10-CM | POA: Insufficient documentation

## 2016-03-06 NOTE — Progress Notes (Signed)
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 Pt states she has Been doing low carb since 2007.Pt states she always has  elevated fasting glucose,pt states her fasting has never been under 115 and her 2 hr is 110 or 120. Pt states she does not "sugar at night". Pt states she usually has nutella and fruit late at night. Pt states her fatty liver has motivated her. Pt states she had sepsis a few weeks ago. Pt states she  joined a gym 3-4 days a week. Pt states she has an irradic lifestyle switching days and nights, just wants guidance on fast meals. Pt states she goes 12 hours without eating sometimes.  Dietitian reviewed some food options as well as the necessity of carbohydrates in her diet and to not completely avoid them.   Dietitian gave her the MyPlate, snack sheet, and Support group handouts.

## 2016-03-14 ENCOUNTER — Other Ambulatory Visit: Payer: Self-pay

## 2016-03-14 ENCOUNTER — Telehealth: Payer: Self-pay

## 2016-03-14 DIAGNOSIS — D696 Thrombocytopenia, unspecified: Secondary | ICD-10-CM

## 2016-03-14 NOTE — Telephone Encounter (Signed)
  No good way to tell changes in the steatosis with studies she has had. She had thrombocytopenia recently - new - ? Due to illness Need to repeat the PLT count in 1-2 months to make sure it normalizes If it doesn't could be a marker of fibrosis, etc and would consider elastography vs biopsy   Gatha Mayer, MD, [facility]  Contacted the patient. She agrees to come in for repeat platelet count.

## 2016-03-15 ENCOUNTER — Encounter: Payer: Self-pay | Admitting: Nurse Practitioner

## 2016-03-15 DIAGNOSIS — D696 Thrombocytopenia, unspecified: Secondary | ICD-10-CM | POA: Diagnosis not present

## 2016-03-19 ENCOUNTER — Encounter: Payer: Self-pay | Admitting: Nurse Practitioner

## 2016-03-20 DIAGNOSIS — N12 Tubulo-interstitial nephritis, not specified as acute or chronic: Secondary | ICD-10-CM | POA: Diagnosis not present

## 2016-03-21 ENCOUNTER — Telehealth: Payer: Self-pay | Admitting: Internal Medicine

## 2016-03-21 NOTE — Telephone Encounter (Signed)
She is asking about what trials you know of at [facility] that would be appropriate for her and fatty liver?

## 2016-03-21 NOTE — Telephone Encounter (Signed)
What I would tell her is she can make an appointment to be seen by Dr. Carmel [place] who specializes in fatty liver and see.  I am not up on all of the possibilities I just mentioned that [facility] had trials. She needs to see someone there to discuss if she wants

## 2016-03-22 ENCOUNTER — Encounter: Payer: Self-pay | Admitting: Internal Medicine

## 2016-03-25 DIAGNOSIS — K760 Fatty (change of) liver, not elsewhere classified: Secondary | ICD-10-CM | POA: Diagnosis not present

## 2016-03-25 DIAGNOSIS — R0683 Snoring: Secondary | ICD-10-CM | POA: Diagnosis not present

## 2016-03-25 DIAGNOSIS — E669 Obesity, unspecified: Secondary | ICD-10-CM | POA: Diagnosis not present

## 2016-03-25 NOTE — Telephone Encounter (Signed)
Referral faxed. Patient notified.

## 2016-04-05 MED FILL — SULFAMETHOXAZOLE/TMP DS TAB: 800-160 | 30 days supply | Qty: 30 | Fill #1

## 2016-04-05 MED FILL — SIMVASTATIN 40 MG TABLET: 40 | 90 days supply | Qty: 90 | Fill #0

## 2016-04-05 MED FILL — METFORMIN HCL ER 500 MG TAB: 500 | 90 days supply | Qty: 360 | Fill #1

## 2016-04-10 ENCOUNTER — Other Ambulatory Visit (HOSPITAL_BASED_OUTPATIENT_CLINIC_OR_DEPARTMENT_OTHER): Payer: Self-pay

## 2016-04-10 DIAGNOSIS — R0683 Snoring: Secondary | ICD-10-CM

## 2016-04-10 DIAGNOSIS — N12 Tubulo-interstitial nephritis, not specified as acute or chronic: Secondary | ICD-10-CM | POA: Diagnosis not present

## 2016-04-17 ENCOUNTER — Other Ambulatory Visit (HOSPITAL_BASED_OUTPATIENT_CLINIC_OR_DEPARTMENT_OTHER): Payer: Self-pay | Admitting: Family Medicine

## 2016-04-17 DIAGNOSIS — Z1231 Encounter for screening mammogram for malignant neoplasm of breast: Secondary | ICD-10-CM

## 2016-04-18 ENCOUNTER — Encounter: Payer: Self-pay | Admitting: Internal Medicine

## 2016-04-18 ENCOUNTER — Ambulatory Visit (HOSPITAL_BASED_OUTPATIENT_CLINIC_OR_DEPARTMENT_OTHER)
Admission: RE | Admit: 2016-04-18 | Discharge: 2016-04-18 | Disposition: A | Discharge status: Home / Self Care | Payer: 59 | Source: Ambulatory Visit | Attending: Family Medicine | Admitting: Family Medicine

## 2016-04-18 DIAGNOSIS — Z1231 Encounter for screening mammogram for malignant neoplasm of breast: Secondary | ICD-10-CM | POA: Diagnosis not present

## 2016-04-20 ENCOUNTER — Encounter: Payer: Self-pay | Admitting: Internal Medicine

## 2016-05-01 ENCOUNTER — Ambulatory Visit (HOSPITAL_BASED_OUTPATIENT_CLINIC_OR_DEPARTMENT_OTHER): Payer: 59 | Attending: Family Medicine | Admitting: Internal Medicine

## 2016-05-01 VITALS — Ht 66.5 in | Wt 246.0 lb

## 2016-05-01 DIAGNOSIS — G4736 Sleep related hypoventilation in conditions classified elsewhere: Secondary | ICD-10-CM | POA: Insufficient documentation

## 2016-05-01 DIAGNOSIS — G4733 Obstructive sleep apnea (adult) (pediatric): Secondary | ICD-10-CM | POA: Diagnosis not present

## 2016-05-01 DIAGNOSIS — R0683 Snoring: Secondary | ICD-10-CM

## 2016-05-04 DIAGNOSIS — E785 Hyperlipidemia, unspecified: Secondary | ICD-10-CM | POA: Diagnosis not present

## 2016-05-04 DIAGNOSIS — E1169 Type 2 diabetes mellitus with other specified complication: Secondary | ICD-10-CM | POA: Diagnosis not present

## 2016-05-04 DIAGNOSIS — E559 Vitamin D deficiency, unspecified: Secondary | ICD-10-CM | POA: Diagnosis not present

## 2016-05-06 DIAGNOSIS — Z79899 Other long term (current) drug therapy: Secondary | ICD-10-CM | POA: Diagnosis not present

## 2016-05-06 DIAGNOSIS — E119 Type 2 diabetes mellitus without complications: Secondary | ICD-10-CM | POA: Diagnosis not present

## 2016-05-06 DIAGNOSIS — A419 Sepsis, unspecified organism: Secondary | ICD-10-CM | POA: Diagnosis not present

## 2016-05-06 DIAGNOSIS — I10 Essential (primary) hypertension: Secondary | ICD-10-CM | POA: Diagnosis not present

## 2016-05-06 DIAGNOSIS — Z87440 Personal history of urinary (tract) infections: Secondary | ICD-10-CM | POA: Diagnosis not present

## 2016-05-06 DIAGNOSIS — K760 Fatty (change of) liver, not elsewhere classified: Secondary | ICD-10-CM | POA: Diagnosis not present

## 2016-05-07 ENCOUNTER — Telehealth: Payer: Self-pay

## 2016-05-08 ENCOUNTER — Ambulatory Visit (INDEPENDENT_AMBULATORY_CARE_PROVIDER_SITE_OTHER): Payer: 59 | Admitting: Family Medicine

## 2016-05-08 ENCOUNTER — Encounter: Payer: Self-pay | Admitting: Family Medicine

## 2016-05-08 VITALS — BP 118/74 | HR 73 | Temp 98.1°F | Ht 66.0 in | Wt 245.2 lb

## 2016-05-08 DIAGNOSIS — R7303 Prediabetes: Secondary | ICD-10-CM | POA: Diagnosis not present

## 2016-05-08 DIAGNOSIS — K760 Fatty (change of) liver, not elsewhere classified: Secondary | ICD-10-CM

## 2016-05-08 DIAGNOSIS — E6609 Other obesity due to excess calories: Secondary | ICD-10-CM | POA: Diagnosis not present

## 2016-05-08 DIAGNOSIS — Z6839 Body mass index (BMI) 39.0-39.9, adult: Secondary | ICD-10-CM

## 2016-05-08 MED FILL — HYDROCHLOROTHIAZIDE 25 MG T: 25 | 90 days supply | Qty: 90 | Fill #3

## 2016-05-08 MED FILL — LOSARTAN POTASSIUM 50 MG TA: 50 | 90 days supply | Qty: 90 | Fill #0

## 2016-05-08 MED FILL — METOPROLOL SUCC ER 50 MG TA: 50 | 90 days supply | Qty: 90 | Fill #0

## 2016-05-08 NOTE — Patient Instructions (Signed)
You can try to go off of the Metformin or take 1 tab twice daily.

## 2016-05-08 NOTE — Progress Notes (Signed)
Chief Complaint  Patient presents with  . Establish Care    liver (fatty liver)       New Patient Visit SUBJECTIVE: HPI: Alexandra Walker is an 60 y.o.female who is being seen for establishing care.  The patient was previously seen at Dr. Marveen Reeks office, bought by [facility] and now out of network.  Hx of fatty liver disease, discovered on CTA. Was seeing GI for this and was interested in a study looking at a new medication (monoclonal Ab's?) for NASH.   Prediabetes She has joined a gym and is seeing a nutritionist today. She is changing her diet for the better and has lost 10 lbs over the past couple months. She has never been dx'd with DM II. She is taking Metformin 1000 mg BID right now.   Allergies  Allergen Reactions  . Ace Inhibitors Other (See Comments)    coughing  . Oxycodone-Acetaminophen Nausea And Vomiting  . Percocet [Oxycodone-Acetaminophen] Nausea And Vomiting  . Vicodin [Hydrocodone-Acetaminophen] Nausea And Vomiting  . Victoza [Liraglutide] Other (See Comments)    pancreatitis    Past Medical History:  Diagnosis Date  . 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 Arthritis   . Chronic depressive disorder   . Chronic edema    bilateral ankles  . Circadian rhythm sleep disorder   . Diabetes ([place])   . Ganglion of knee    left  . GERD (gastroesophageal reflux disease)    rare  . Glucose intolerance (impaired glucose tolerance)   . Hepatic steatosis   . HLA B27 (HLA B27 positive)   . Hyperlipidemia   . Hypertension   . Hypokalemia   . Hypomagnesemia   . IBS (irritable bowel syndrome)   . Ischemic colitis ([place])   . Kyphosis   . Lateral epicondylitis of right elbow   . Lymphadenopathy   . Migraine headache    history of  . Morbid obesity ([place])   . Onycholysis   . PAC (premature atrial contraction)    PAC, PJC, managed well with BB  . Paronychia   . Paroxysmal atrial fibrillation ([place])    a. 01/2016 - brief episode during adm for severe sepsis.  Marland Kitchen PMS  (premenstrual syndrome)   . PONV (postoperative nausea and vomiting)   . Pyelonephritis 01/2016  . Sacroiliitis ([place])   . Seasonal allergies   . Sepsis ([place]) 01/2016  . SVT (supraventricular tachycardia) ([place])    a. 01/2016 - SVT, wide complex tachycardia, and brief afib in setting of severe sepsis.  . Trigger ring finger of left hand   . Vitamin D deficiency    Past Surgical History:  Procedure Laterality Date  . abdominal laparascopic  92  . BLADDER SURGERY    . BREAST SURGERY  2003   bx lt-neg  . CERVICAL CONE BIOPSY    . CHOLECYSTECTOMY N/A 10/26/2014   Procedure: LAPAROSCOPIC CHOLECYSTECTOMY;  Surgeon: Coralie Keens, MD;  Location: [facility];  Service: General;  Laterality: N/A;  . COLONOSCOPY W/ BIOPSIES  multiple  . DIAGNOSTIC LAPAROSCOPY    . DILATION AND CURETTAGE OF UTERUS  07,11  . ESOPHAGOGASTRODUODENOSCOPY  multiple  . KNEE ARTHROSCOPY  07,10   left  . LAPAROSCOPIC VAGINAL HYSTERECTOMY WITH SALPINGO OOPHORECTOMY Bilateral 10/03/2015   Procedure: LAPAROSCOPIC ASSISTED VAGINAL HYSTERECTOMY WITH SALPINGECTOMY;  Surgeon: Eldred Manges, MD;  Location: [facility];  Service: Gynecology;  Laterality: Bilateral;  . left finger surgery    . MASS EXCISION Left 12/24/2012   Procedure: LEFT EXCISION  OF PALMAR MASS X TWO;  Surgeon: Roseanne Kaufman, MD;  Location: [facility];  Service: Orthopedics;  Laterality: Left;   Social History   Social History  . Marital status: Divorced  . Number of children: 1   Occupational History  . [employer]   Social History Main Topics  . Smoking status: Never Smoker  . Smokeless tobacco: Never Used  . Alcohol use 0.0 oz/week     Comment: social  . Drug use: No   Social History Narrative   Nurse midwife with the [employer]   Family History  Problem Relation Age of Onset  . Heart attack Father   . Heart disease Father   . Coronary artery disease Mother   . Hypertension  Mother   . Heart disease Mother   . Heart attack Mother   . Hypertension Sister   . Hypertension Brother   . Colon cancer Neg Hx      Current Outpatient Prescriptions:  .  aspirin 325 MG tablet, Take 325 mg by mouth daily., Disp: , Rfl:  .  cholecalciferol (VITAMIN D) 1000 UNITS tablet, Take 1,000 Units by mouth daily., Disp: , Rfl:  .  cyclobenzaprine (FLEXERIL) 5 MG tablet, Take 1 tablet (5 mg total) by mouth at bedtime as needed for muscle spasms., Disp: 30 tablet, Rfl: 2 .  hydrochlorothiazide 25 MG tablet, Take 25 mg by mouth daily. Once daily, Disp: , Rfl:  .  losartan (COZAAR) 50 MG tablet, Take 50 mg by mouth daily., Disp: , Rfl:  .  Magnesium 500 MG CAPS, Take 1 capsule by mouth at bedtime., Disp: , Rfl:  .  metFORMIN (GLUCOPHAGE) 500 MG tablet, Take 1,000 mg by mouth 2 (two) times daily with a meal., Disp: , Rfl:  .  metoprolol succinate (TOPROL-XL) 50 MG 24 hr tablet, Take 50 mg by mouth daily. Take with or immediately following a meal., Disp: , Rfl:  .  Omega-3 Fatty Acids (FISH OIL PO), Take 1 tablet by mouth daily., Disp: , Rfl:  .  saccharomyces boulardii (FLORASTOR) 250 MG capsule, Take 1 capsule (250 mg total) by mouth 2 (two) times daily., Disp: 60 capsule, Rfl: 0 .  simvastatin (ZOCOR) 40 MG tablet, Take 40 mg by mouth every evening., Disp: , Rfl:  .  traMADol (ULTRAM) 50 MG tablet, Take 1 tablet (50 mg total) by mouth every 4 (four) hours as needed for severe pain (start tramadol if patient has taken oral food with nausea)., Disp: 30 tablet, Rfl: 0 .  Turmeric 450 MG CAPS, Take 900 mg by mouth daily., Disp: , Rfl:   Patient's last menstrual period was 05/20/2008.  ROS Cardiovascular: Denies chest pain  Respiratory: Denies dyspnea   OBJECTIVE: BP 118/74 (BP Location: Right Arm, Patient Position: Sitting, Cuff Size: Large)   Pulse 73   Temp 98.1 F (36.7 C) (Oral)   Ht '5\' 6"'  (1.676 m)   Wt 245 lb 3.2 oz (111.2 kg)   LMP 05/20/2008   SpO2 98%   BMI 39.58 kg/m    Constitutional: -  VS reviewed -  Well developed, well nourished, appears stated age -  No apparent distress  Psychiatric: -  Oriented to person, place, and time -  Memory intact -  Affect and mood normal -  Fluent conversation, good eye contact -  Judgment and insight age appropriate  Respiratory: -  Normal respiratory effort, no accessory muscle use, no retraction  Neurological:  -  CN  II - XII grossly intact -  Sensation grossly intact to light touch, equal bilaterally  Musculoskeletal: -  No clubbing, no cyanosis -  Gait normal  Skin: -  No significant lesion on inspection -  Warm and dry to palpation   ASSESSMENT/PLAN: Fatty liver disease, nonalcoholic  Prediabetes  Class 2 obesity due to excess calories without serious comorbidity with body mass index (BMI) of 39.0 to 39.9 in adult  Reviewed labs.  I am interested to see what the results of the study will be. OK to wean down Metformin to 500 mg BID or off of it altogether given her A1c and recent lifestyle changes. Patient should return in 6 mo or prn. The patient voiced understanding and agreement to the plan.  Greater than 30 min were spent face to face with the patient and greater than 50% was spent with counseling coordination of care.   [place], DO 05/08/16  9:34 AM

## 2016-05-08 NOTE — Progress Notes (Signed)
Pre visit review using our clinic review tool, if applicable. No additional management support is needed unless otherwise documented below in the visit note. 

## 2016-05-18 NOTE — Procedures (Signed)
   Patient Name: Alexandra Walker, Alexandra Walker Date: 05/01/2016 Gender: Female D.O.B: [birth year] Age (years): 9 Referring Provider: Addison Naegeli MD Height (inches): 67 Interpreting Physician: Baird Lyons MD, ABSM Weight (lbs): 246 RPSGT: Gerhard Perches BMI: 39 MRN: [id number] Neck Size: 15.00 CLINICAL INFORMATION Sleep Study Type: NPSG  Indication for sleep study: Snoring  Epworth Sleepiness Score: 6  SLEEP STUDY TECHNIQUE As per the AASM Manual for the Scoring of Sleep and Associated Events v2.3 (April 2016) with a hypopnea requiring 4% desaturations.  The channels recorded and monitored were frontal, central and occipital EEG, electrooculogram (EOG), submentalis EMG (chin), nasal and oral airflow, thoracic and abdominal wall motion, anterior tibialis EMG, snore microphone, electrocardiogram, and pulse oximetry.  MEDICATIONS Medications self-administered by patient taken the night of the study : none reported  SLEEP ARCHITECTURE The study was initiated at 11:27:57 PM and ended at 5:37:27 AM.  Sleep onset time was 12.8 minutes and the sleep efficiency was 89.0%. The total sleep time was 329.0 minutes.  Stage REM latency was 273.0 minutes.  The patient spent 32.98% of the night in stage N1 sleep, 54.26% in stage N2 sleep, 5.78% in stage N3 and 6.99% in REM.  Alpha intrusion was absent.  Supine sleep was 8.09%.  RESPIRATORY PARAMETERS The overall apnea/hypopnea index (AHI) was 21.0 per hour. There were 0 total apneas, including 0 obstructive, 0 central and 0 mixed apneas. There were 115 hypopneas and 39 RERAs.  The AHI during Stage REM sleep was 26.1 per hour.  AHI while supine was 81.2 per hour.  The mean oxygen saturation was 87.87%. The minimum SpO2 during sleep was 77.00%.  Moderate snoring was noted during this study.  CARDIAC DATA The 2 lead EKG demonstrated sinus rhythm. The mean heart rate was 68.08 beats per minute. Other EKG findings include: None.  LEG  MOVEMENT DATA The total PLMS were 0 with a resulting PLMS index of 0.00. Associated arousal with leg movement index was 0.0 .  IMPRESSIONS - Moderate obstructive sleep apnea occurred during this study (AHI = 21.0/h). Most events were hypopneas. - No significant central sleep apnea occurred during this study (CAI = 0.0/h). - Moderate oxygen desaturation was noted during this study (Min O2 = 77.00%). Mean saturation was only 87%- is there underlying cardiopulmonary disease? - The patient snored with Moderate snoring volume. - No cardiac rhythm abnormalities were noted during this study. - Clinically significant periodic limb movements did not occur during sleep. No significant associated arousals. - Sleep was fragmented with frequent awakenings and reported hip discomfort. There was insufficient sustained early sleep to meet protocol requirements for split CPAP titration.  DIAGNOSIS - Obstructive Sleep Apnea (327.23 [G47.33 ICD-10]) - Nocturnal Hypoxemia (327.26 [G47.36 ICD-10])  RECOMMENDATIONS - Therapeutic CPAP titration to determine optimal pressure required to alleviate sleep disordered breathing. Consider providing a sedative- hypnotic to permit better sustained sleep during CPAP titration and home use with appropriate counseling, if appropriate. - Positional therapy avoiding supine position during sleep. - Sleep hygiene should be reviewed to assess factors that may improve sleep quality. - Weight management and regular exercise should be initiated or continued if appropriate.  [Electronically signed] 05/18/2016 11:23 AM  Baird Lyons MD, ABSM Diplomate, American Board of Sleep Medicine   NPI: [id number]  [place], American Board of Sleep Medicine  ELECTRONICALLY SIGNED ON:  05/18/2016, 11:14 AM [name] PH: [phone]   FX: [phone] [place]

## 2016-05-21 ENCOUNTER — Telehealth: Payer: Self-pay | Admitting: Internal Medicine

## 2016-05-21 ENCOUNTER — Telehealth: Payer: Self-pay

## 2016-05-21 DIAGNOSIS — G4733 Obstructive sleep apnea (adult) (pediatric): Secondary | ICD-10-CM

## 2016-05-21 NOTE — Telephone Encounter (Signed)
I have spoken to patient and informed her of Dr Nani Ravens will not be able to review results. I have also contacted Addison Naegeli office to inform them patient has requested to have her results and has been waiting for 3 weeks to have them given to her. Power has went out twice in there office and will telephone patient with result. TL/CMA

## 2016-05-21 NOTE — Telephone Encounter (Signed)
Pt called into the office requesting if you can take a look ather sleep study. She does not really understand the results which were sent to her via [facility]. Pt wants a little clarification. If you are able could you please review results of sleep study per the patient request. TL/CMA

## 2016-05-21 NOTE — Telephone Encounter (Signed)
I would have her contact Dr. Janee Morn office (her sleep specialist) for this. TY.

## 2016-05-21 NOTE — Telephone Encounter (Signed)
Spoke with Lelan Pons, I informed the pt. That I don't see where she sees any of our providers nor does she have a consult coming up. I do see where Dr. Annamaria Boots read the study but I informed the pt. That we would not be able to give her the results she needs to call the ordering physician office. She states she has called them and they stated they could not see the results scanned in. The ordering physician office is in [place] and I was unsure why they couldn't see it if I could. I reached out to Dr. Marveen Reeks office and spoke with a nurse and she had to go under care every where to see anything. Just to insure that they have the results, faxed the results to the nurse [phone] fax number). Informed the pt. That someone from Dr. Marveen Reeks office will contact her. Nothing further is needed at this time.

## 2016-05-22 NOTE — Telephone Encounter (Signed)
OK to refer. Put provider name in comments. Dx OSA.

## 2016-05-22 NOTE — Telephone Encounter (Signed)
I have spoken with pt who informed me that she does not have sleep specialist set up and has been seen by Dr. Brett Fairy (neurologist/Sleep Specialist) and patient would like to go back to [facility]m to be evaluated. TL/CMA

## 2016-05-22 NOTE — Addendum Note (Signed)
Addended by: [place] Cellar on: 05/22/2016 04:57 PM   Modules accepted: Orders

## 2016-05-28 ENCOUNTER — Ambulatory Visit: Payer: 59 | Admitting: Internal Medicine

## 2016-06-04 NOTE — Telephone Encounter (Signed)
Patient is scheduled for an appt with Dr. Carmel [place] 09/17/16

## 2016-06-05 ENCOUNTER — Encounter: Payer: Self-pay | Admitting: Cardiovascular Disease

## 2016-06-10 ENCOUNTER — Telehealth: Payer: Self-pay | Admitting: Cardiovascular Disease

## 2016-06-10 NOTE — Telephone Encounter (Signed)
Alexandra Walker, Can you help me set up an appt for her in June and cancel her appt with me this month. See email. Thanks, chris

## 2016-06-18 ENCOUNTER — Ambulatory Visit (INDEPENDENT_AMBULATORY_CARE_PROVIDER_SITE_OTHER): Payer: 59 | Admitting: Neurology

## 2016-06-18 ENCOUNTER — Encounter: Payer: Self-pay | Admitting: Neurology

## 2016-06-18 VITALS — BP 146/84 | HR 66 | Resp 20 | Ht 66.0 in | Wt 242.0 lb

## 2016-06-18 DIAGNOSIS — G4733 Obstructive sleep apnea (adult) (pediatric): Secondary | ICD-10-CM

## 2016-06-18 DIAGNOSIS — E1169 Type 2 diabetes mellitus with other specified complication: Secondary | ICD-10-CM

## 2016-06-18 DIAGNOSIS — G4726 Circadian rhythm sleep disorder, shift work type: Secondary | ICD-10-CM

## 2016-06-18 DIAGNOSIS — K760 Fatty (change of) liver, not elsewhere classified: Secondary | ICD-10-CM

## 2016-06-18 NOTE — Progress Notes (Signed)
SLEEP MEDICINE CLINIC   Provider:  Larey Walker, M D  Referring Provider: Shelda Walker* Primary Care Physician:  Alexandra Pal, DO  Chief Complaint  Patient presents with  . New Patient (Initial Visit)    had sleep study, never had cpap    HPI:  Alexandra Walker is a 61 y.o. female , seen here as a referral  from Dr. 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 She was exhausted. In the meantime she has made attempts to lose some weight, she joined tae kwon do classes, and has lost another 14 pounds since joining the gym in October. She is also with a new primary care physician Dr. Nani Walker. Until recently she was followed by Dr. Addison Walker, whose office is now affiliated with the [facility] and out of network. She is taking metformin 1000 g twice a day right now, still considered prediabetic ( ?).  Her after meal glucometer readings have been in normal range but her fasting morning glucose is elevated constantly. She reports measuring 130s 140s.  She was also diagnosed with a fatty liver, non-hepatitic. She has joined [facility], a biopsy was recently scheduled. Negative fibrosis scan.  She also has chronic lower extremity edema, monopolar depressive disorder, circadian rhythm sleep disorder, diabetes, Baker's cyst and ganglion of the left knee, GERD, anticardiolipin syndrome, hepatic steatosis, hyperlipidemia, hypertension, hypokalemia, hypomagnesemia, sacroiliitis. She was admitted with sepsis in September 2017, was very sick and developed supraventricular tachycardia in the setting as well as a brief run of atrial fibrillation. Her follow-up cardiac studies returned normal. There is a concern that the patient with his morning glucose levels lacks  slow wave sleep or delta sleep, thereby is not generating enough insulin or human growth hormone. Dr. Nani Walker referred her to Dr. Baird Walker for sleep study AHI during REM sleep was 26.1 per hour supine AHI 81.2 per hour REM latency was very long 273 minutes. Sleep efficiency was 89% oxygen nadir 77% mean oxygen saturation was 87.8. No PLMS noted.  Overall AHI was 21.0 per hour. Given these data are not strongly recommend CPAP titration.  She got no results from the sleep study, and no appointment . 3 or 4 weeks after sleep test, she called our office to have an outside sleep study interpreted and explained. .   Sleep habits are as follows: bedtime is 11 PM , asleep by 11.  30 minutes - sleeping through for 6 hours, and when not working ,may have fragmented , dream rich  sleep until 10 AM.  Sleep in a cool, quiet and dark room, alone. One pillow, sleeping on the side. Neck hurts, uses special pillows. Has a stuffy nose, uses afrin. .   Social history:   Single, mother on BiPAP-  Never used CPAP. Midwife, losing weight, going to the gym. No tobacco use , no ETOH use. Low carb. Caffeine - 1 cup a day. 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    Review of Systems: Out of a complete 14 system review, the patient complains of only the following symptoms, and all other reviewed systems are negative.  When she had her first sleep study she was  still a shift worker had great difficulties initiating and maintaining sleep, and she was very apprehensive and negative about CPAP. Her most recent sleep study showed that she was able to sleep without a sleep aid at a higher sleep efficiency and definitely had apnea. Her overall AHI is high enough to invite her for CPAP titration. Given that she has a history of rhinitis and stuffy nose I would like for her to have heated humidification and try and nasal mask, nasal pillow and full face mask for  comfort. She should feel free to choose her interface. She endorsed the Epworth at 8, fatigue severity at 35 points.   Social History   Social History  . Marital status: Divorced    Spouse name: N/A  . Number of children: 1  . Years of education: N/A   Occupational History  . [employer]   Social History Main Topics  . Smoking status: Never Smoker  . Smokeless tobacco: Never Used  . Alcohol use 0.0 oz/week     Comment: social  . Drug use: No  . Sexual activity: Not on file   Other Topics Concern  . Not on file   Social History Narrative   Nurse midwife with the [employer]    Family History  Problem Relation Age of Onset  . Heart attack Father   . Heart disease Father   . Coronary artery disease Mother   . Hypertension Mother   . Heart disease Mother   . Heart attack Mother   . Hypertension Sister   . Hypertension Brother   . Colon cancer Neg Hx     Past Medical History:  Diagnosis Date  . 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[place])   . Arthritis   . Chronic depressive disorder   . Chronic edema    bilateral ankles  . Circadian rhythm sleep disorder   . Diabetes ([place])   . Ganglion of knee    left  . GERD (gastroesophageal reflux disease)    rare  . Glucose intolerance (impaired glucose tolerance)   . Hepatic steatosis   . HLA B27 (HLA B27 positive)   . Hyperlipidemia   . Hypertension   . Hypokalemia   . Hypomagnesemia   . IBS (irritable bowel syndrome)   . Ischemic colitis ([place])   . Kyphosis   . Lateral epicondylitis of right elbow   . Lymphadenopathy   . Migraine headache    history of  . Morbid obesity ([place])   . Onycholysis   . PAC (premature atrial contraction)    PAC, PJC, managed well with BB  . Paronychia   . Paroxysmal atrial fibrillation ([place])    a. 01/2016 - brief episode during adm for severe sepsis.  Marland Kitchen PMS (premenstrual syndrome)   . PONV (postoperative nausea and vomiting)   . Pyelonephritis 01/2016  .  Sacroiliitis ([place])   . Seasonal allergies   . Sepsis ([place]) 01/2016  . SVT (supraventricular tachycardia) ([place])    a. 01/2016 - SVT, wide complex tachycardia, and brief afib in setting of severe sepsis.  . Trigger ring finger of left hand   . Vitamin D deficiency     Past Surgical History:  Procedure Laterality Date  . abdominal laparascopic  92  . BLADDER SURGERY    . BREAST SURGERY  2003   bx lt-neg  . CERVICAL CONE BIOPSY    . CHOLECYSTECTOMY N/A 10/26/2014   Procedure: LAPAROSCOPIC CHOLECYSTECTOMY;  Surgeon: Coralie Keens, MD;  Location: [facility];  Service: General;  Laterality: N/A;  . COLONOSCOPY W/ BIOPSIES  multiple  . DIAGNOSTIC LAPAROSCOPY    . DILATION AND CURETTAGE OF UTERUS  07,11  . ESOPHAGOGASTRODUODENOSCOPY  multiple  . KNEE ARTHROSCOPY  07,10   left  . LAPAROSCOPIC VAGINAL HYSTERECTOMY WITH SALPINGO OOPHORECTOMY Bilateral 10/03/2015   Procedure: LAPAROSCOPIC ASSISTED VAGINAL HYSTERECTOMY WITH SALPINGECTOMY;  Surgeon: Eldred Manges, MD;  Location: [facility];  Service: Gynecology;  Laterality: Bilateral;  . left finger surgery    . MASS EXCISION Left 12/24/2012   Procedure: LEFT EXCISION OF PALMAR MASS X TWO;  Surgeon: Roseanne Kaufman, MD;  Location: [facility];  Service: Orthopedics;  Laterality: Left;    Current Outpatient Prescriptions  Medication Sig Dispense Refill  . aspirin 325 MG tablet Take 325 mg by mouth daily.    . cholecalciferol (VITAMIN D) 1000 UNITS tablet Take 1,000 Units by mouth daily.    . cyclobenzaprine (FLEXERIL) 5 MG tablet Take 1 tablet (5 mg total) by mouth at bedtime as needed for muscle spasms. 30 tablet 2  . hydrochlorothiazide 25 MG tablet Take 25 mg by mouth daily. Once daily    . losartan (COZAAR) 50 MG tablet Take 50 mg by mouth daily.    . Magnesium 500 MG CAPS Take 1 capsule by mouth at bedtime.    . metFORMIN (GLUCOPHAGE) 500 MG tablet Take 1,000 mg by mouth 2 (two) times daily with a meal.    .  metoprolol succinate (TOPROL-XL) 50 MG 24 hr tablet Take 50 mg by mouth daily. Take with or immediately following a meal.    . Omega-3 Fatty Acids (FISH OIL PO) Take 1 tablet by mouth daily.    Marland Kitchen saccharomyces boulardii (FLORASTOR) 250 MG capsule Take 1 capsule (250 mg total) by mouth 2 (two) times daily. 60 capsule 0  . simvastatin (ZOCOR) 40 MG tablet Take 40 mg by mouth every evening.    . Turmeric 450 MG CAPS Take 900 mg by mouth daily.     No current facility-administered medications for this visit.     Allergies as of 06/18/2016 - Review Complete 06/18/2016  Allergen Reaction Noted  . Ace inhibitors Other (See Comments) 09/05/2015  . Oxycodone-acetaminophen Nausea And Vomiting 09/19/2011  . Percocet [oxycodone-acetaminophen] Nausea And Vomiting 09/19/2011  . Vicodin [hydrocodone-acetaminophen] Nausea And Vomiting 09/19/2011  . Victoza [liraglutide] Other (See Comments) 09/01/2014    Vitals: BP (!) 146/84   Pulse 66   Resp 20   Ht 5' 6" (1.676 m)   Wt 242 lb (109.8 kg)   LMP 05/20/2008   BMI 39.06 kg/m  Last Weight:  Wt Readings from Last 1 Encounters:  06/18/16 242 lb (109.8 kg)   YIR:SWNI mass index is 39.06 kg/m.     Last Height:   Ht Readings from Last 1 Encounters:  06/18/16 5' 6" (1.676 m)    Physical exam:  General: The patient is awake, alert and appears not in acute distress. The patient is well groomed. Head: Normocephalic, atraumatic. Neck is supple. Mallampati 4  neck circumference: 15.5 . Nasal airflow restricted , TMJ is  evident . Retrognathia is not seen.  Bruxism marks . Wears mouth guard .  Cardiovascular:  Regular rate and rhythm,  without  murmurs or carotid bruit, and without distended neck veins. Respiratory: Lungs are clear to auscultation. Skin:  Without evidence of edema, or rash Trunk: BMI is 39.06. The patient's posture is erect  Neurologic exam : The patient is  awake and alert, oriented to place and time.   Memory subjective described  as intact. Attention span & concentration ability appears normal. Speech is fluent,  without dysarthria, dysphonia or aphasia. Mood and affect are appropriate.  Cranial nerves: Pupils are equal and briskly reactive to light. Funduscopic exam without evidence of pallor or edema. Extraocular movements  in vertical and horizontal planes intact and without nystagmus. Visual fields by finger perimetry are intact.Hearing to finger rub intact. Facial sensation intact to fine touch. Facial motor strength is symmetric and tongue and uvula move midline. Shoulder shrug was symmetrical.  Motor exam:   Normal tone, muscle bulk and symmetric strength in all extremities. Sensory:  Fine touch, pinprick and vibration were tested in all extremities. Proprioception tested in the upper extremities was normal. Coordination: Rapid alternating movements in the fingers/hands was normal. Finger-to-nose maneuver  normal without evidence of ataxia, dysmetria or tremor. Gait and station: Patient walks without assistive device and is able unassisted to climb up to the exam table. Strength within normal limits. Stance is stable and normal.   Deep tendon reflexes: in the  upper and lower extremities are symmetric and intact.   The patient was advised of the nature of the diagnosed sleep disorder, the treatment options and risks for general a health and wellness arising from not treating the condition.  I spent more than 45  minutes of face to face time with the patient. Greater than 50% of time was spent in counseling and coordination of care. We have discussed the diagnosis and differential and I answered the patient's questions.     Assessment:  After physical and neurologic examination, review of laboratory studies,  Personal review of imaging studies, reports of other /same  Imaging studies ,  Results of polysomnography/ neurophysiology testing and pre-existing records as far as provided in visit., my assessment is   1)  The  [place] study confirmed that Mrs. Gwyndolyn Saxon has apnea and needs CPAP titration. Patients with REM dependent sleep apnea also do not benefit from ENT surgery. I will schedule this study at [facility] sleep, with a request to change to BiPAP if needed for comfort.   2)  Avoid supine sleep   3)  continue weight loss.    Asencion Partridge Dohmeier MD  06/18/2016   CC: Alexandra Pal, [address] [place],  [phone]

## 2016-06-19 ENCOUNTER — Ambulatory Visit: Payer: 59 | Admitting: Cardiovascular Disease

## 2016-06-21 DIAGNOSIS — M79671 Pain in right foot: Secondary | ICD-10-CM | POA: Diagnosis not present

## 2016-06-21 DIAGNOSIS — G8929 Other chronic pain: Secondary | ICD-10-CM | POA: Diagnosis not present

## 2016-06-21 DIAGNOSIS — M7750 Other enthesopathy of unspecified foot: Secondary | ICD-10-CM | POA: Diagnosis not present

## 2016-06-23 ENCOUNTER — Ambulatory Visit (INDEPENDENT_AMBULATORY_CARE_PROVIDER_SITE_OTHER): Payer: 59 | Admitting: Neurology

## 2016-06-23 DIAGNOSIS — G4726 Circadian rhythm sleep disorder, shift work type: Secondary | ICD-10-CM

## 2016-06-23 DIAGNOSIS — G4733 Obstructive sleep apnea (adult) (pediatric): Secondary | ICD-10-CM

## 2016-06-23 DIAGNOSIS — E1169 Type 2 diabetes mellitus with other specified complication: Secondary | ICD-10-CM

## 2016-06-23 DIAGNOSIS — K760 Fatty (change of) liver, not elsewhere classified: Secondary | ICD-10-CM

## 2016-06-27 ENCOUNTER — Telehealth: Payer: Self-pay | Admitting: Neurology

## 2016-06-27 ENCOUNTER — Encounter: Payer: Self-pay | Admitting: Neurology

## 2016-06-27 DIAGNOSIS — I480 Paroxysmal atrial fibrillation: Secondary | ICD-10-CM

## 2016-06-27 DIAGNOSIS — G4733 Obstructive sleep apnea (adult) (pediatric): Secondary | ICD-10-CM

## 2016-06-27 DIAGNOSIS — I471 Supraventricular tachycardia: Secondary | ICD-10-CM

## 2016-06-27 NOTE — Procedures (Signed)
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 In the meantime she has made attempts to lose some weight, she joined martial arts classes, and has lost another 14 pounds since joining a gym in October. She had developed atrial fibrillation, SVT and  nocturnal palpitations and recently had a sleep study with Dr. Annamaria Boots at [facility], but got no results from the sleep study, and no appointment.  After 3 or 4 weeks had passed, she called our office to have an outside sleep study interpreted and explained. Her most recent sleep study showed that she was able to sleep without a sleep aid at a higher sleep efficiency , but definitely had apnea. Her overall AHI is high enough to invite her for CPAP titration. She is sleeping through for 6 hours, and when not working, may have fragmented, dream rich sleep until 10 AM. Sleeps in a cool, quiet and dark room, alone, on one pillow, sleeps on the side.  Given that she has a history of rhinitis and "stuffy nose" fostering mouth breathing , I would like for her to have heated humidification and try a nasal mask, nasal pillow and full face mask for comfort. She should feel free to choose her interface. She endorsed the Epworth Sleepiness Score at 8, fatigue severity at 35 points.     Atrial fib, ASV- tachycardia , Anticardiolipin Ab Syndrome, rheumatoid Arthritis, Depressive Disorder, Chronic edema, Circadian rhythm sleep disorder- shift work induced, Diabetes, Ganglion of knee, Hepatic steatosis, HLA B27 positive, Hyperlipidemia, Hypertension, Hypomagnesemia, IBS  The patient endorsed the Epworth Sleepiness Scale at 8 points and the Fatigue Score at -35-  points.   The patient's weight 242 pounds with a height of 66 (inches), resulting in a BMI of 38 kg/m2. The patient's neck circumference measured 15.5 inches.  CURRENT MEDICATIONS: ASA, Vitamin D, Hydrochlorothiazide, Flexeril, Magnesium, Glucophage, Metoprolol, Omega 3, Zocor    PROCEDURE:  This is a multichannel digital polysomnogram utilizing the SomnoStar 11.2 system.  Electrodes and sensors were applied and monitored per AASM Specifications.   EEG, EOG, Chin and Limb EMG, were sampled at 200 Hz.  ECG, Snore and Nasal Pressure, Thermal Airflow, Respiratory Effort, CPAP Flow and Pressure, Oximetry was sampled at 50 Hz. Digital video and audio were recorded.      CPAP was initiated at 5 cmH20 with heated humidity per AASM split night standards and pressure was advanced to 8cmH20 because of hypopneas, apneas and desaturations.  At a PAP pressure of 8 cmH20, there was a reduction of the AHI to 0.0 with improvement of the above symptoms of obstructive sleep apnea.    Lights Out was at 22:48 and Lights On at 05:00. Total recording time (TRT) was 372.5 minutes, with a total sleep time (TST) of 269 minutes. The patient's sleep latency was 67.5 minutes with 3 minutes of wake time after sleep onset. REM latency was 118 minutes.  The sleep efficiency was 72.2 %.    SLEEP ARCHITECTURE: WASO (Wake after sleep onset) was 80.5 minutes.  There were 63 minutes in Stage N1, 99 minutes Stage N2, 45.5 minutes Stage  N3 and 61.5 minutes in Stage REM.  The percentage of Stage N1 was 23.4%, Stage N2 was 36.8%, Stage N3 was 16.9% and Stage R (REM sleep) was 22.9%.   RESPIRATORY ANALYSIS:  There were 4 respiratory events: 0 apneas and 4 hypopneas with 0 respiratory event related arousals (RERAs).     The total APNEA/HYPOPNEA INDEX (AHI) was 0.9 /hour and the total RESPIRATORY DISTURBANCE INDEX was the same.   1 event occurred in REM sleep and 3 events in NREM. The REM AHI was 1.0 /hr.  versus a non-REM AHI of 0.9 /hour.   The patient spent 65.5 minutes of total sleep time in the supine position and 204 minutes in non-supine. The supine AHI was 2.7, versus a non-supine AHI of 0.3.  OXYGEN SATURATION & C02:  The baseline 02 saturation was 94%, with the lowest being 85%. Time spent below 89% saturation equaled 25 minutes.  PERIODIC LIMB MOVEMENTS:    The patient had a total of 0 Periodic Limb Movements. The arousals were noted as: 13 were spontaneous, 0 were associated with PLMs, and 2 were associated with respiratory events.  Audio and video analysis did not show any abnormal or unusual movements, behaviors, phonations or vocalizations.  The patient took one bathroom break. Snoring was noted in supine and hypoxemia was supine positional as well. EKG was in keeping with normal sinus rhythm (NSR), no evidence of atrial fibrillation over night.  The patient was fitted with a nasal pillow while using her mouth guard.   DIAGNOSIS 1) OSA alleviated with 8 cm water pressure, hypoxemia reduced to 0.0 minutes after reaching 8 cm water pressure. Nadir at 90% SpO2.  PLANS/RECOMMENDATIONS:  Auto titration able CPAP to be ordered, set at 8 cm water with 3 cm EPR and nasal pillow, Airfit P 10 in medium size. Please inquire about patient's CPAP model preference.  Heated humidity is ordered. The patient will follow up in 30-60 days after CPAP initiation.    A follow up appointment will be scheduled in the Sleep Clinic at [facility].   Please call [phone] with any questions.     I certify that I have reviewed the entire raw data recording prior to the issuance of this report in accordance with the Standards of Accreditation of the [place] [employer] (AASM)    Larey Seat, MD  06-27-2016

## 2016-06-27 NOTE — Telephone Encounter (Signed)
Beverlee Nims, These are the results to share with the patient, CPAP is ordered.     Audio and video analysis did not show any abnormal or unusual movements, behaviors, phonations or vocalizations.  The patient took one bathroom break. Snoring was noted in supine and hypoxemia was supine positional as well. EKG was in keeping with normal sinus rhythm (NSR), no evidence of atrial fibrillation over night.  The patient was fitted with a nasal pillow while using her mouth guard.   DIAGNOSIS 1) OSA alleviated with 8 cm water pressure, hypoxemia reduced to 0.0 minutes after reaching 8 cm water pressure. Nadir at 90% SpO2.  PLANS/RECOMMENDATIONS:  Auto titration able CPAP to be ordered, set at 8 cm water with 3 cm EPR and nasal pillow, Airfit P 10 in medium size. Please inquire about patient's CPAP model preference.  Heated humidity is ordered. The patient will follow up in 30-60 days after CPAP initiation.    A follow up appointment will be scheduled in the Sleep Clinic at [facility].   Please call [phone] with any questions.

## 2016-07-01 NOTE — Telephone Encounter (Signed)
I spoke to patient via [place]. She is aware of results and recommendations. She is willing to proceed with treatment. I weill send orders to [facility]. I will send copy of report to PCP. Patient will get a letter reminding her to make f/u appt and stress the importance of compliance.

## 2016-07-08 ENCOUNTER — Ambulatory Visit (INDEPENDENT_AMBULATORY_CARE_PROVIDER_SITE_OTHER): Payer: 59 | Admitting: Family Medicine

## 2016-07-08 ENCOUNTER — Encounter: Payer: Self-pay | Admitting: Family Medicine

## 2016-07-08 VITALS — BP 121/74 | HR 68 | Temp 99.3°F | Ht 66.0 in | Wt 240.6 lb

## 2016-07-08 DIAGNOSIS — R6889 Other general symptoms and signs: Secondary | ICD-10-CM

## 2016-07-08 DIAGNOSIS — R509 Fever, unspecified: Secondary | ICD-10-CM | POA: Diagnosis not present

## 2016-07-08 DIAGNOSIS — R0981 Nasal congestion: Secondary | ICD-10-CM

## 2016-07-08 MED ORDER — OSELTAMIVIR PHOSPHATE 75 MG PO CAPS: 75.0000 mg | ORAL_CAPSULE | Freq: Two times a day (BID) | ORAL | 0 refills | Status: DC

## 2016-07-08 NOTE — Telephone Encounter (Signed)
Pre visit call completed 

## 2016-07-08 NOTE — Progress Notes (Signed)
Chief Complaint  Patient presents with  . Nasal Congestion    Pt reports cough with thick clear mucus with body aches and low grade fever     Alexandra Walker here for URI complaints.  Duration: 1 day  Associated symptoms: fever (Tmax 99.7 F), sinus congestion, rhinorrhea, myalgia and cough Denies: sinus pain, ear pain, ear drainage, sore throat and shortness of breath Treatment to date: Mucinex Sick contacts: Works as Marine scientist in ED, daughter also go sick  ROS:  Const: +fevers HEENT: As noted in HPI Lungs: No SOB  Past Medical History:  Diagnosis Date  . 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[place])   . Arthritis   . Chronic depressive disorder   . Chronic edema    bilateral ankles  . Circadian rhythm sleep disorder   . Diabetes ([place])   . Ganglion of knee    left  . GERD (gastroesophageal reflux disease)    rare  . Glucose intolerance (impaired glucose tolerance)   . Hepatic steatosis   . HLA B27 (HLA B27 positive)   . Hyperlipidemia   . Hypertension   . Hypokalemia   . Hypomagnesemia   . IBS (irritable bowel syndrome)   . Ischemic colitis ([place])   . Kyphosis   . Lateral epicondylitis of right elbow   . Lymphadenopathy   . Migraine headache    history of  . Morbid obesity ([place])   . Onycholysis   . PAC (premature atrial contraction)    PAC, PJC, managed well with BB  . Paronychia   . Paroxysmal atrial fibrillation ([place])    a. 01/2016 - brief episode during adm for severe sepsis.  Marland Kitchen PMS (premenstrual syndrome)   . PONV (postoperative nausea and vomiting)   . Pyelonephritis 01/2016  . Sacroiliitis ([place])   . Seasonal allergies   . Sepsis ([place]) 01/2016  . SVT (supraventricular tachycardia) ([place])    a. 01/2016 - SVT, wide complex tachycardia, and brief afib in setting of severe sepsis.  . Trigger ring finger of left hand   . Vitamin D deficiency    Family History  Problem Relation Age of Onset  . Heart attack Father   . Heart disease Father   . Coronary artery disease Mother    . Hypertension Mother   . Heart disease Mother   . Heart attack Mother   . Hypertension Sister   . Hypertension Brother   . Colon cancer Neg Hx     BP 121/74 (BP Location: Left Arm, Patient Position: Sitting, Cuff Size: Large)   Pulse 68   Temp 99.3 F (37.4 C) (Oral)   Ht '5\' 6"'  (1.676 m)   Wt 240 lb 9.6 oz (109.1 kg)   LMP 05/20/2008   SpO2 99%   BMI 38.83 kg/m  General: Awake, alert, appears stated age HEENT: AT, [place], ears patent b/l and TM's neg, nares patent w/o discharge, no sinus tenderness, pharynx pink and without exudates, MMM Neck: No masses or asymmetry Heart: RRR, no murmurs, no bruits Lungs: CTAB, no accessory muscle use Psych: Age appropriate judgment and insight, normal mood and affect  Flu-like symptoms - Plan: oseltamivir (TAMIFLU) 75 MG capsule  Orders as above. Tamiflu if things worsen in next 24 hours with myalgias and misery. Ibuprofen and acetaminophen. Offered cough suppressant but she declined for now.  Continue to push fluids, practice good hand hygiene, cover mouth when coughing. Letter for work given if she needs it. F/u prn. If starting to experience high fevers, shaking, or shortness of  breath, seek immediate care. Pt voiced understanding and agreement to the plan.  [place], DO 07/08/16 4:37 PM

## 2016-07-08 NOTE — Patient Instructions (Signed)
Continue to push fluids, practice good hand hygiene, and cover your mouth if you cough.  If you start having high fevers, shaking or shortness of breath, seek immediate care.

## 2016-07-08 NOTE — Progress Notes (Signed)
Pre visit review using our clinic review tool, if applicable. No additional management support is needed unless otherwise documented below in the visit note. 

## 2016-07-15 ENCOUNTER — Telehealth: Payer: Self-pay | Admitting: Neurology

## 2016-07-15 NOTE — Telephone Encounter (Signed)
I sent [facility] an email to check on this status

## 2016-07-15 NOTE — Telephone Encounter (Signed)
Pt called said [facility] is not returning her calls.

## 2016-07-16 NOTE — Telephone Encounter (Signed)
This is the message I received back....  This patient is ready to be scheduled, I called her 07/02/2016 and left a VM, I have not yet made a second attempt.  I will do that today. Thank you Beverlee Nims

## 2016-07-16 NOTE — Telephone Encounter (Signed)
I spoke to patient and gave her the information below.

## 2016-07-23 DIAGNOSIS — G4733 Obstructive sleep apnea (adult) (pediatric): Secondary | ICD-10-CM | POA: Diagnosis not present

## 2016-07-26 MED FILL — METFORMIN HCL ER 500 MG TAB: 500 | 90 days supply | Qty: 360 | Fill #2

## 2016-07-26 MED FILL — SIMVASTATIN 40 MG TABLET: 40 | 90 days supply | Qty: 90 | Fill #1

## 2016-07-26 MED FILL — DICLOFENAC SODIUM 1% GEL: 1 | 25 days supply | Qty: 100 | Fill #0

## 2016-07-26 MED FILL — PREMARIN VAGINAL CREAM-APPL: 0.625 | 84 days supply | Qty: 30 | Fill #1

## 2016-07-26 MED FILL — LOSARTAN POTASSIUM 50 MG TA: 50 | 90 days supply | Qty: 90 | Fill #0

## 2016-08-23 DIAGNOSIS — G4733 Obstructive sleep apnea (adult) (pediatric): Secondary | ICD-10-CM | POA: Diagnosis not present

## 2016-08-23 MED FILL — METOPROLOL SUCC ER 50 MG TA: 50 | 90 days supply | Qty: 90 | Fill #1

## 2016-09-10 DIAGNOSIS — S63655A Sprain of metacarpophalangeal joint of left ring finger, initial encounter: Secondary | ICD-10-CM | POA: Diagnosis not present

## 2016-09-10 DIAGNOSIS — M79645 Pain in left finger(s): Secondary | ICD-10-CM | POA: Diagnosis not present

## 2016-09-17 DIAGNOSIS — K760 Fatty (change of) liver, not elsewhere classified: Secondary | ICD-10-CM | POA: Diagnosis not present

## 2016-09-17 DIAGNOSIS — E669 Obesity, unspecified: Secondary | ICD-10-CM | POA: Diagnosis not present

## 2016-09-17 DIAGNOSIS — R7309 Other abnormal glucose: Secondary | ICD-10-CM | POA: Diagnosis not present

## 2016-09-18 ENCOUNTER — Other Ambulatory Visit: Payer: Self-pay | Admitting: *Deleted

## 2016-09-18 MED ORDER — HYDROCHLOROTHIAZIDE 25 MG PO TABS: 25.0000 mg | ORAL_TABLET | Freq: Every day | ORAL | 5 refills | Status: DC

## 2016-09-18 NOTE — Telephone Encounter (Signed)
Rx sent to the pharmacy by e-script.//AB/CMA 

## 2016-09-19 MED FILL — HYDROCHLOROTHIAZIDE 25 MG T: 25 | 90 days supply | Qty: 90 | Fill #0

## 2016-09-22 DIAGNOSIS — G4733 Obstructive sleep apnea (adult) (pediatric): Secondary | ICD-10-CM | POA: Diagnosis not present

## 2016-09-24 ENCOUNTER — Encounter: Payer: Self-pay | Admitting: Neurology

## 2016-09-26 ENCOUNTER — Ambulatory Visit (INDEPENDENT_AMBULATORY_CARE_PROVIDER_SITE_OTHER): Payer: 59 | Admitting: Neurology

## 2016-09-26 ENCOUNTER — Encounter: Payer: Self-pay | Admitting: Neurology

## 2016-09-26 VITALS — BP 140/81 | HR 59 | Ht 66.0 in | Wt 241.0 lb

## 2016-09-26 DIAGNOSIS — K760 Fatty (change of) liver, not elsewhere classified: Secondary | ICD-10-CM | POA: Diagnosis not present

## 2016-09-26 DIAGNOSIS — Z9989 Dependence on other enabling machines and devices: Secondary | ICD-10-CM | POA: Diagnosis not present

## 2016-09-26 DIAGNOSIS — R7302 Impaired glucose tolerance (oral): Secondary | ICD-10-CM | POA: Diagnosis not present

## 2016-09-26 DIAGNOSIS — G4726 Circadian rhythm sleep disorder, shift work type: Secondary | ICD-10-CM

## 2016-09-26 DIAGNOSIS — G4733 Obstructive sleep apnea (adult) (pediatric): Secondary | ICD-10-CM | POA: Insufficient documentation

## 2016-09-26 NOTE — Progress Notes (Signed)
SLEEP MEDICINE CLINIC   Provider:  Larey Seat, M D  Referring Provider: Shelda Pal* Primary Care Physician:  Shelda Pal, DO  Chief Complaint  Patient presents with  . Follow-up    cpap, doesn't feel better    HPI:  Alexandra Walker is a 61 y.o. female , seen here as a referral  from [name] for a new evaluation of her sleep disorder,  Interval history from 09/26/2016. Alexandra Walker returned after a ordered a CPAP titration for her based on a recent referral by Shelda Pal, D.O.  The sleep study was ordered based on a baseline polysomnography performed at the [place] in December 2017, which had diagnosed the patient with sleep apnea, hypopnea at an AHI of 21 per hour, moderate hypoxemia of sleep, mean saturation was only 87% SPO2 raising the question of an underlying cardiopulmonary disease but probably hypoventilation due to obesity. The patient was titrated to 8 cm water pressure the AHI was 0.9 per hour, she did not produce any periodic limb movements oxygen nadir rose to 85% of the time spent at 88% saturation or below equal at only 25 minutes. She was fitted with a nasal pillow while using a mouthguard.  As the pleasure to look at the patient's compliance record today, which is excellent she has used to machine 28 out of 30 days with a 77% compliance but hours. 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 Average user time is 5 hours 21 minutes, residual apnea index is 0.3 the 95th percentile pressure has been met, she does not have air leaks. I would like for her to continue with the current machine and settings as well as the interface. The patient also reported that her hepatologist at [facility] voiced approval of CPAP therapy for all patients with fatty liver disease. I hope that the CPAP will help her to reduce body weight, promote sleep before midnight allowing her to have more  balanced insulin household, and continues to allow her be less daytime sleepy. She endorsed today the Epworth sleepiness score at only 5 points and fatigue severity score was 36 points. She has visibly lost weight, her face and neck are smaller and I adjusted the measurements in my physical exam note. I will order a ONO for her  After she mentioned that her dentist felt continuous clenching represents hypoxemia- or a reaction to low oxygen.   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 She was exhausted. In the meantime she has made attempts to lose some weight, she joined tae kwon do classes, and has lost another 14 pounds since joining the gym in October. She is also with a new primary care physician [name]. Until recently she was followed by [name], whose office is now affiliated with the [place] [facility] system and out of network. She is taking metformin 1000 g twice a day right now, still considered prediabetic ( ?).  Her after meal the glucometer readings have been in normal range but her fasting morning glucose is elevated constantly. She reports measuring 130s 140s.  She was also diagnosed with a fatty liver, non-hepatitic. She has joined [facility], a biopsy was recently scheduled. Negative fibrosis scan.  She also has  chronic lower extremity edema, monopolar depressive disorder, circadian rhythm sleep disorder, diabetes, Baker's cyst and ganglion of the left knee, GERD, anticardiolipin syndrome, hepatic steatosis, hyperlipidemia, hypertension, hypokalemia, hypomagnesemia, sacroiliitis. She was admitted with sepsis in September 2017, was very sick and developed supraventricular tachycardia in the setting as well as a brief run of atrial fibrillation. Her follow-up cardiac studies returned normal. There is a concern  that the patient with his morning glucose levels lacks slow wave sleep or delta sleep, thereby is not generating enough insulin or human growth hormone. [name] referred her to [name] for sleep study AHI during REM sleep was 26.1 per hour supine AHI 81.2 per hour REM latency was very long 273 minutes. Sleep efficiency was 89% oxygen nadir 77% mean oxygen saturation was 87.8. No PLMS noted.  Overall AHI was 21.0 per hour. Given these data are not strongly recommend CPAP titration.  She got no results of this sleep study, and no appointment  Within 3 or 4 weeks after sleep test, she called our office to have an outside sleep study interpreted and explained.  Sleep habits are as follows: bedtime is 11 PM , asleep by 11.  30 minutes - sleeping through for 6 hours, and when not working ,may have fragmented , dream rich  sleep until 10 AM.  Sleep in a cool, quiet and dark room, alone. One pillow, sleeping on the side. Neck hurts, uses special pillows. Has a stuffy nose, uses afrin. .  Social history:   Single, mother on BiPAP-  Never used CPAP. Midwife, losing weight, going to the gym. No tobacco use , no ETOH use. Low carb. Caffeine - 1 cup a day. 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    Review of Systems: Out of a complete 14 system review, the patient complains of only the following symptoms, and all other reviewed systems are negative.  When she had her first sleep study she was still a shift worker had great difficulties initiating and maintaining sleep, and she was very apprehensive and negative about CPAP. Her most recent sleep study showed that she was able to sleep without a sleep aid at a higher sleep efficiency and definitely had apnea. Her overall AHI is high enough to invite her for CPAP titration. Given that she has a history of rhinitis and stuffy nose I would like for her to have heated humidification and try  and nasal mask, nasal pillow and full face mask for comfort. She should feel free to choose her interface. She endorsed the Epworth at 5 from 8, fatigue severity at 36 points.   Social History   Social History  . Marital status: Divorced    Spouse name: N/A  . Number of children: 1  . Years of education: N/A   Occupational History  . [employer]   Social History Main Topics  . Smoking status: Never Smoker  . Smokeless tobacco: Never Used  . Alcohol use 0.0 oz/week     Comment: social  . Drug use: No  . Sexual activity: Not on file   Other Topics Concern  . Not on file   Social History Narrative   Nurse midwife with the [employer]    Family History  Problem Relation Age of Onset  . Heart attack Father   . Heart disease Father   . Coronary artery disease Mother   .  Hypertension Mother   . Heart disease Mother   . Heart attack Mother   . Hypertension Sister   . Hypertension Brother   . Colon cancer Neg Hx     Past Medical History:  Diagnosis Date  . 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[place])   . Arthritis   . Chronic depressive disorder   . Chronic edema    bilateral ankles  . Circadian rhythm sleep disorder   . Diabetes ([place])   . Ganglion of knee    left  . GERD (gastroesophageal reflux disease)    rare  . Glucose intolerance (impaired glucose tolerance)   . Hepatic steatosis   . HLA B27 (HLA B27 positive)   . Hyperlipidemia   . Hypertension   . Hypokalemia   . Hypomagnesemia   . IBS (irritable bowel syndrome)   . Ischemic colitis ([place])   . Kyphosis   . Lateral epicondylitis of right elbow   . Lymphadenopathy   . Migraine headache    history of  . Morbid obesity ([place])   . Onycholysis   . PAC (premature atrial contraction)    PAC, PJC, managed well with BB  . Paronychia   . Paroxysmal atrial fibrillation ([place])    a. 01/2016 - brief episode during adm for severe sepsis.  Marland Kitchen PMS (premenstrual syndrome)   . PONV  (postoperative nausea and vomiting)   . Pyelonephritis 01/2016  . Sacroiliitis ([place])   . Seasonal allergies   . Sepsis ([place]) 01/2016  . SVT (supraventricular tachycardia) ([place])    a. 01/2016 - SVT, wide complex tachycardia, and brief afib in setting of severe sepsis.  . Trigger ring finger of left hand   . Vitamin D deficiency     Past Surgical History:  Procedure Laterality Date  . abdominal laparascopic  92  . BLADDER SURGERY    . BREAST SURGERY  2003   bx lt-neg  . CERVICAL CONE BIOPSY    . CHOLECYSTECTOMY N/A 10/26/2014   Procedure: LAPAROSCOPIC CHOLECYSTECTOMY;  Surgeon: Coralie Keens, MD;  Location: [facility];  Service: General;  Laterality: N/A;  . COLONOSCOPY W/ BIOPSIES  multiple  . DIAGNOSTIC LAPAROSCOPY    . DILATION AND CURETTAGE OF UTERUS  07,11  . ESOPHAGOGASTRODUODENOSCOPY  multiple  . KNEE ARTHROSCOPY  07,10   left  . LAPAROSCOPIC VAGINAL HYSTERECTOMY WITH SALPINGO OOPHORECTOMY Bilateral 10/03/2015   Procedure: LAPAROSCOPIC ASSISTED VAGINAL HYSTERECTOMY WITH SALPINGECTOMY;  Surgeon: Eldred Manges, MD;  Location: [facility];  Service: Gynecology;  Laterality: Bilateral;  . left finger surgery    . MASS EXCISION Left 12/24/2012   Procedure: LEFT EXCISION OF PALMAR MASS X TWO;  Surgeon: Roseanne Kaufman, MD;  Location: [facility];  Service: Orthopedics;  Laterality: Left;    Current Outpatient Prescriptions  Medication Sig Dispense Refill  . aspirin 325 MG tablet Take 325 mg by mouth daily.    . cholecalciferol (VITAMIN D) 1000 UNITS tablet Take 1,000 Units by mouth daily.    . cyclobenzaprine (FLEXERIL) 5 MG tablet Take 1 tablet (5 mg total) by mouth at bedtime as needed for muscle spasms. 30 tablet 2  . hydrochlorothiazide (HYDRODIURIL) 25 MG tablet Take 1 tablet (25 mg total) by mouth daily. Once daily 30 tablet 5  . losartan (COZAAR) 50 MG tablet Take 50 mg by mouth daily.    . Magnesium 500 MG CAPS Take 1 capsule by mouth at bedtime.     . metFORMIN (GLUCOPHAGE) 500 MG tablet Take 1,000 mg by mouth  2 (two) times daily with a meal.    . metoprolol succinate (TOPROL-XL) 50 MG 24 hr tablet Take 50 mg by mouth daily. Take with or immediately following a meal.    . Omega-3 Fatty Acids (FISH OIL PO) Take 1 tablet by mouth daily.    Marland Kitchen saccharomyces boulardii (FLORASTOR) 250 MG capsule Take 1 capsule (250 mg total) by mouth 2 (two) times daily. 60 capsule 0  . simvastatin (ZOCOR) 40 MG tablet Take 40 mg by mouth every evening.    . Turmeric 450 MG CAPS Take 900 mg by mouth daily.     No current facility-administered medications for this visit.     Allergies as of 09/26/2016 - Review Complete 09/26/2016  Allergen Reaction Noted  . Ace inhibitors Other (See Comments) 09/05/2015  . Oxycodone-acetaminophen Nausea And Vomiting 09/19/2011  . Percocet [oxycodone-acetaminophen] Nausea And Vomiting 09/19/2011  . Vicodin [hydrocodone-acetaminophen] Nausea And Vomiting 09/19/2011  . Victoza [liraglutide] Other (See Comments) 09/01/2014    Vitals: BP 140/81   Pulse (!) 59   Ht '5\' 6"'  (1.676 m)   Wt 241 lb (109.3 kg)   LMP 05/20/2008   BMI 38.90 kg/m  Last Weight:  Wt Readings from Last 1 Encounters:  09/26/16 241 lb (109.3 kg)   TDD:UKGU mass index is 38.9 kg/m.     Last Height:   Ht Readings from Last 1 Encounters:  09/26/16 '5\' 6"'  (1.676 m)    Physical exam:  General: The patient is awake, alert and appears not in acute distress. The patient is well groomed. Head: Normocephalic, atraumatic. Neck is supple. Mallampati 4  neck circumference: 15.25 . Nasal airflow restricted , TMJ is evident . Retrognathia is not seen.  Bruxism marks ! . Still wears mouth guard, which fits unde nasal pillows/ nasal  ask.  Cardiovascular:  Regular rate and rhythm,  without  murmurs or carotid bruit, and without distended neck veins. Respiratory: Lungs are clear to auscultation. Skin:  Without evidence of edema, or rash Trunk: BMI is 38.9 .  The patient's posture is erect-   Neurologic exam : The patient is awake and alert, oriented to place and time.   Mood and affect are appropriate.  Cranial nerves: Pupils are equal and briskly reactive to light. Extraocular movements  in vertical and horizontal planes intact and without nystagmus. Visual fields by finger perimetry are intact.Hearing to finger rub intact. Facial sensation intact to fine touch. Facial motor strength is symmetric and tongue and uvula move midline. Tongue protrusion into cheeks is strong.  Shoulder shrug was symmetrical.   The patient was advised of the nature of the diagnosed sleep disorder, the treatment options and risks for general a health and wellness arising from not treating the condition.  I spent more than 25  minutes of face to face time with the patient.  Greater than 50% of time was spent in counseling and coordination of care. We have discussed the diagnosis and differential and I answered the patient's questions.     Assessment:  After physical and neurologic examination, review of laboratory studies,  Personal review of imaging studies, reports of other /same  Imaging studies ,  Results of polysomnography/ neurophysiology testing and pre-existing records as far as provided in visit., my assessment is   1) OSA- Patients with REM dependent sleep apnea also do not benefit from ENT surgery. She uses CPAP at 8 cm with 3 cm EPR , highly compliant and lost 20 pounds since October .  Uses mouth  guard, dentist still feels she clenches, grinds- and believed this can represent ongoing hypoxemia. She would like an ONO, continued CPAP use is recommened. I will order one today.   2) shift work Pharmacologist, her second sleep disorder.  3) impaired fasting glucose, fatty liver, morbid obesity. Dr Orland Dec follows her at [facility]. These conditions are also promoted by shift work and poor sleep quality , too.   Rv in 6 month with Np or me  , unless ONO is abnormal.    Larey Seat MD  09/26/2016   CC: Shelda Pal, [address] [place], [place] [address]

## 2016-09-27 DIAGNOSIS — S63655D Sprain of metacarpophalangeal joint of left ring finger, subsequent encounter: Secondary | ICD-10-CM | POA: Diagnosis not present

## 2016-10-03 ENCOUNTER — Encounter (INDEPENDENT_AMBULATORY_CARE_PROVIDER_SITE_OTHER): Payer: 59 | Admitting: Family Medicine

## 2016-10-07 ENCOUNTER — Telehealth: Payer: Self-pay | Admitting: Family Medicine

## 2016-10-07 NOTE — Telephone Encounter (Signed)
Called and spoke with the pt and she stated that she has an old blood sugar meter and she would like to see if she could get strips and meter.  She said the strips have expired.  She stated that she was thinking about getting a new meter.  Informed the pt that the meter that she has now I have not seen in the computer.  Pt asked if she should just start with a new meter.  Informed the pt that would be best.  Asked the pt to call her pharmacy and have them to check to see which meter her insurance will cover.  Pt verbalized understanding and agreed.//AB/CMA

## 2016-10-07 NOTE — Telephone Encounter (Signed)
Relation to XQ:KSKS Call back number:[phone] Pharmacy:  [place], [place] - [address] [phone] (Phone) [phone] (Fax)     Reason for call:  Patient requesting test strips and guclose monitor, please advise

## 2016-10-17 ENCOUNTER — Encounter: Payer: Self-pay | Admitting: Family Medicine

## 2016-10-17 DIAGNOSIS — E1121 Type 2 diabetes mellitus with diabetic nephropathy: Secondary | ICD-10-CM

## 2016-10-21 MED ORDER — FREESTYLE LITE DEVI: 0 refills | Status: DC

## 2016-10-21 MED ORDER — FREESTYLE LANCETS MISC: 12 refills | Status: DC

## 2016-10-21 MED ORDER — GLUCOSE BLOOD VI STRP: ORAL_STRIP | 12 refills | Status: DC

## 2016-10-21 MED FILL — FREESTYLE LITE TEST STRIP: 30 days supply | Qty: 100 | Fill #0

## 2016-10-21 MED FILL — FREESTYLE LITE METER: 1 days supply | Qty: 1 | Fill #0

## 2016-10-21 MED FILL — FREESTYLE LANCETS: 30 days supply | Qty: 100 | Fill #0

## 2016-10-21 NOTE — Telephone Encounter (Signed)
Rx for the Freestyle Lite glucose meter,strips, and lancets have been sent to the pharmacy by e-script.  Pt aware.//AB/CMA

## 2016-10-22 ENCOUNTER — Encounter: Payer: Self-pay | Admitting: Family Medicine

## 2016-10-23 ENCOUNTER — Encounter: Payer: Self-pay | Admitting: Neurology

## 2016-10-23 DIAGNOSIS — G4733 Obstructive sleep apnea (adult) (pediatric): Secondary | ICD-10-CM | POA: Diagnosis not present

## 2016-10-23 NOTE — Telephone Encounter (Signed)
Please advise.//AB/CMA 

## 2016-10-24 DIAGNOSIS — R0902 Hypoxemia: Secondary | ICD-10-CM | POA: Diagnosis not present

## 2016-10-26 ENCOUNTER — Encounter: Payer: Self-pay | Admitting: Family Medicine

## 2016-10-26 ENCOUNTER — Ambulatory Visit (INDEPENDENT_AMBULATORY_CARE_PROVIDER_SITE_OTHER): Payer: 59 | Admitting: Family Medicine

## 2016-10-26 VITALS — BP 118/76 | HR 59 | Temp 98.8°F | Wt 238.0 lb

## 2016-10-26 DIAGNOSIS — W57XXXA Bitten or stung by nonvenomous insect and other nonvenomous arthropods, initial encounter: Secondary | ICD-10-CM

## 2016-10-26 DIAGNOSIS — S40861A Insect bite (nonvenomous) of right upper arm, initial encounter: Secondary | ICD-10-CM

## 2016-10-26 MED ORDER — DOXYCYCLINE HYCLATE 100 MG PO CAPS: 100.0000 mg | ORAL_CAPSULE | Freq: Two times a day (BID) | ORAL | 0 refills | Status: DC

## 2016-10-26 MED ORDER — DOXYCYCLINE HYCLATE 100 MG PO CAPS: 100.0000 mg | ORAL_CAPSULE | Freq: Two times a day (BID) | ORAL | 0 refills | Status: AC

## 2016-10-26 NOTE — Progress Notes (Signed)
   Subjective:    Patient ID: Alexandra Walker, female    DOB: November 18, 1955, 61 y.o.   MRN: [id number]  HPI Here for advice about a tick bite. She pulled out a tick from the right arm with tweezers 3 days ago. She feels fine but is worried about Lyme disease.    Review of Systems  Constitutional: Negative.   Respiratory: Negative.   Cardiovascular: Negative.   Skin: Negative for rash.  Neurological: Negative.        Objective:   Physical Exam  Constitutional: She is oriented to person, place, and time. She appears well-developed and well-nourished.  Cardiovascular: Normal rate, regular rhythm, normal heart sounds and intact distal pulses.   Pulmonary/Chest: Effort normal and breath sounds normal.  Neurological: She is alert and oriented to person, place, and time.  Skin:  Tiny puncture mark on the right upper arm           Assessment & Plan:  Tick bite, cover with Doxycycline. Alysia Penna, MD

## 2016-10-26 NOTE — Progress Notes (Signed)
Pre visit review using our clinic review tool, if applicable. No additional management support is needed unless otherwise documented below in the visit note. 

## 2016-10-28 ENCOUNTER — Encounter: Payer: Self-pay | Admitting: Neurology

## 2016-10-28 ENCOUNTER — Ambulatory Visit (INDEPENDENT_AMBULATORY_CARE_PROVIDER_SITE_OTHER): Payer: 59 | Admitting: Family Medicine

## 2016-10-28 ENCOUNTER — Encounter (INDEPENDENT_AMBULATORY_CARE_PROVIDER_SITE_OTHER): Payer: Self-pay | Admitting: Family Medicine

## 2016-10-28 VITALS — BP 112/70 | HR 51 | Temp 98.4°F | Ht 66.0 in | Wt 234.0 lb

## 2016-10-28 DIAGNOSIS — E669 Obesity, unspecified: Secondary | ICD-10-CM

## 2016-10-28 DIAGNOSIS — E119 Type 2 diabetes mellitus without complications: Secondary | ICD-10-CM | POA: Diagnosis not present

## 2016-10-28 DIAGNOSIS — Z0289 Encounter for other administrative examinations: Secondary | ICD-10-CM

## 2016-10-28 DIAGNOSIS — Z6837 Body mass index (BMI) 37.0-37.9, adult: Secondary | ICD-10-CM

## 2016-10-28 DIAGNOSIS — R0602 Shortness of breath: Secondary | ICD-10-CM

## 2016-10-28 DIAGNOSIS — R5383 Other fatigue: Secondary | ICD-10-CM | POA: Diagnosis not present

## 2016-10-28 DIAGNOSIS — Z8719 Personal history of other diseases of the digestive system: Secondary | ICD-10-CM | POA: Diagnosis not present

## 2016-10-28 DIAGNOSIS — Z1331 Encounter for screening for depression: Secondary | ICD-10-CM

## 2016-10-28 DIAGNOSIS — Z1389 Encounter for screening for other disorder: Secondary | ICD-10-CM

## 2016-10-28 NOTE — Progress Notes (Signed)
Office: [phone]  /  Fax: [phone]   HPI:   Chief Complaint: OBESITY  Alexandra Walker (MR# [id number]) is a 61 y.o. female who presents on 10/28/2016 for obesity evaluation and treatment. Current BMI is Body mass index is 37.77 kg/m.Alexandra Walker has struggled with obesity for years and has been unsuccessful in either losing weight or maintaining long term weight loss. Alexandra Walker attended our information session and states she is currently in the action stage of change and ready to dedicate time achieving and maintaining a healthier weight.  Alexandra Walker states her family eats meals together she thinks her family will eat healthier with  her her desired weight loss is 86 lbs she has been heavy most of  her life she started gaining weight after Invitro, SAB and divorce her heaviest weight ever was 267 lbs. she has significant food cravings issues  she snacks frequently in the evenings she skips meals frequently she is frequently drinking liquids with calories she frequently makes poor food choices she has problems with excessive hunger  she has binge eating behaviors she struggles with emotional eating    Fatigue Alexandra Walker feels her energy is lower than it should be. This has worsened with weight gain and has not worsened recently. Alexandra Walker admits to daytime somnolence and  denies waking up still tired. Patient is at risk for obstructive sleep apnea. Patent has a history of symptoms of daytime fatigue and morning headache. Patient generally gets 7 hours of sleep per night, and states they generally have restful sleep. Snoring is present. Apneic episodes are not present. Epworth Sleepiness Score is 5  Dyspnea on exertion Alexandra Walker notes increasing shortness of breath with exercising and seems to be worsening over time with weight gain. She notes getting out of breath sooner with activity than she used to. This has not gotten worse recently. Alexandra Walker denies orthopnea.  Diabetes II Alexandra Walker has a diagnosis of  diabetes type II. Alexandra Walker states BGs range between 125 and 130 and 2 hour post prandial between 115 and 130  She has been working on intensive lifestyle modifications including diet, exercise, and weight loss to help control her blood glucose levels.  History of Pancreatitis Alexandra Walker has a history of pancreatitis diagnosis with mildly elevated lipase and with history of gallstones but also on victoza. She denies abdominal pain now.   Depression Screen Alexandra Walker's Food and Mood (modified PHQ-9) score was  Depression screen PHQ 2/9 10/28/2016  Decreased Interest 1  Down, Depressed, Hopeless 3  PHQ - 2 Score 4  Altered sleeping 2  Tired, decreased energy 2  Change in appetite 2  Feeling bad or failure about yourself  2  Trouble concentrating 3  Moving slowly or fidgety/restless 0  Suicidal thoughts 0  PHQ-9 Score 15    ALLERGIES: Allergies  Allergen Reactions  . Ace Inhibitors Other (See Comments)    coughing  . Oxycodone-Acetaminophen Nausea And Vomiting  . Percocet [Oxycodone-Acetaminophen] Nausea And Vomiting  . Vicodin [Hydrocodone-Acetaminophen] Nausea And Vomiting  . Victoza [Liraglutide] Other (See Comments)    pancreatitis    MEDICATIONS: Current Outpatient Prescriptions on File Prior to Visit  Medication Sig Dispense Refill  . aspirin 325 MG tablet Take 325 mg by mouth daily.    . Blood Glucose Monitoring Suppl (FREESTYLE LITE) DEVI Check blood sugar 2-3 times per day.  Dx:E11.21 1 each 0  . cholecalciferol (VITAMIN D) 1000 UNITS tablet Take 1,000 Units by mouth daily.    . cyclobenzaprine (FLEXERIL) 5 MG tablet Take 1  tablet (5 mg total) by mouth at bedtime as needed for muscle spasms. 30 tablet 2  . doxycycline (VIBRAMYCIN) 100 MG capsule Take 1 capsule (100 mg total) by mouth 2 (two) times daily. 14 capsule 0  . glucose blood (FREESTYLE LITE) test strip Check blood sugar 2-3 times per day.  Dx:E11.21. 100 each 12  . hydrochlorothiazide (HYDRODIURIL) 25 MG tablet Take 1  tablet (25 mg total) by mouth daily. Once daily 30 tablet 5  . Lancets (FREESTYLE) lancets Check blood sugar 2-3 times per day.  Dx:E11.21 100 each 12  . losartan (COZAAR) 50 MG tablet Take 50 mg by mouth daily.    . Magnesium 500 MG CAPS Take 1 capsule by mouth at bedtime.    . metFORMIN (GLUCOPHAGE) 500 MG tablet Take 1,000 mg by mouth 2 (two) times daily with a meal.    . metoprolol succinate (TOPROL-XL) 50 MG 24 hr tablet Take 50 mg by mouth daily. Take with or immediately following a meal.    . Omega-3 Fatty Acids (FISH OIL PO) Take 1 tablet by mouth daily.    Marland Kitchen saccharomyces boulardii (FLORASTOR) 250 MG capsule Take 1 capsule (250 mg total) by mouth 2 (two) times daily. 60 capsule 0  . simvastatin (ZOCOR) 40 MG tablet Take 40 mg by mouth every evening.    . Turmeric 450 MG CAPS Take 900 mg by mouth daily.     No current facility-administered medications on file prior to visit.     PAST MEDICAL HISTORY: Past Medical History:  Diagnosis Date  . 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 Arthritis   . Back pain   . Chronic depressive disorder   . Chronic edema    bilateral ankles  . Circadian rhythm sleep disorder   . Diabetes ([place])   . Fatty liver   . Gallbladder problem   . Ganglion of knee    left  . GERD (gastroesophageal reflux disease)    rare  . Glucose intolerance (impaired glucose tolerance)   . Hepatic steatosis   . HLA B27 (HLA B27 positive)   . Hx of pancreatitis   . Hyperlipidemia   . Hypertension   . Hypokalemia   . Hypomagnesemia   . IBS (irritable bowel syndrome)   . IBS (irritable bowel syndrome)   . Ischemic colitis ([place])   . Joint pain   . Kyphosis   . Lateral epicondylitis of right elbow   . Leg edema   . Lymphadenopathy   . Migraine headache    history of  . Morbid obesity ([place])   . Onycholysis   . Osteoarthritis   . PAC (premature atrial contraction)    PAC, PJC, managed well with BB  . Paronychia   . Paroxysmal atrial fibrillation ([place])    a.  01/2016 - brief episode during adm for severe sepsis.  Marland Kitchen PMS (premenstrual syndrome)   . PONV (postoperative nausea and vomiting)   . Prediabetes   . Pyelonephritis 01/2016  . Sacroiliitis ([place])   . Seasonal allergies   . Sepsis ([place]) 01/2016  . Sleep hypopnea   . SVT (supraventricular tachycardia) ([place])    a. 01/2016 - SVT, wide complex tachycardia, and brief afib in setting of severe sepsis.  . Trigger ring finger of left hand   . Vitamin D deficiency   . Vitamin D deficiency     PAST SURGICAL HISTORY: Past Surgical History:  Procedure Laterality Date  . abdominal laparascopic  92  . BLADDER SURGERY    .  BREAST SURGERY  2003   bx lt-neg  . CERVICAL CONE BIOPSY    . CHOLECYSTECTOMY N/A 10/26/2014   Procedure: LAPAROSCOPIC CHOLECYSTECTOMY;  Surgeon: Coralie Keens, MD;  Location: [facility];  Service: General;  Laterality: N/A;  . COLONOSCOPY W/ BIOPSIES  multiple  . DIAGNOSTIC LAPAROSCOPY    . DILATION AND CURETTAGE OF UTERUS  07,11  . ESOPHAGOGASTRODUODENOSCOPY  multiple  . KNEE ARTHROSCOPY  07,10   left  . LAPAROSCOPIC VAGINAL HYSTERECTOMY WITH SALPINGO OOPHORECTOMY Bilateral 10/03/2015   Procedure: LAPAROSCOPIC ASSISTED VAGINAL HYSTERECTOMY WITH SALPINGECTOMY;  Surgeon: Eldred Manges, MD;  Location: [facility];  Service: Gynecology;  Laterality: Bilateral;  . left finger surgery    . MASS EXCISION Left 12/24/2012   Procedure: LEFT EXCISION OF PALMAR MASS X TWO;  Surgeon: Roseanne Kaufman, MD;  Location: [facility];  Service: Orthopedics;  Laterality: Left;    SOCIAL HISTORY: Social History  Substance Use Topics  . Smoking status: Never Smoker  . Smokeless tobacco: Never Used  . Alcohol use 0.0 oz/week     Comment: social    FAMILY HISTORY: Family History  Problem Relation Age of Onset  . Heart attack Father   . Heart disease Father   . Coronary artery disease Mother   . Hypertension Mother   . Heart disease Mother   . Heart attack  Mother   . Diabetes Mother   . Hyperlipidemia Mother   . Kidney disease Mother   . Depression Mother   . Sleep apnea Mother   . Obesity Mother   . Hypertension Sister   . Hypertension Brother   . Colon cancer Neg Hx     ROS: Review of Systems  Constitutional: Positive for malaise/fatigue.  HENT: Positive for congestion (nasal stuffiness).   Eyes:       Wear Glasses or Contacts (reading) floaters  Respiratory: Positive for shortness of breath (on exertion).   Cardiovascular: Negative for orthopnea.  Gastrointestinal: Positive for diarrhea (IBS). Negative for abdominal pain.  Skin: Positive for rash (bites).  Psychiatric/Behavioral: The patient has insomnia.        Stress    PHYSICAL EXAM: Blood pressure 112/70, pulse (!) 51, temperature 98.4 F (36.9 C), temperature source Oral, height _0  (1.676 m), weight 234 lb (106.1 kg), last menstrual period 05/20/2008, SpO2 97 %. Body mass index is 37.77 kg/m. Physical Exam  Constitutional: She is oriented to person, place, and time. She appears well-developed.  Cardiovascular:  Bradycardia rate   Pulmonary/Chest: Effort normal.  Musculoskeletal: Normal range of motion.  Neurological: She is oriented to person, place, and time.  Skin: Skin is warm and dry.  Psychiatric: She has a normal mood and affect. Her behavior is normal.  Vitals reviewed.   RECENT LABS AND TESTS: BMET    Component Value Date/Time   NA 140 02/02/2016 0357   K 3.3 (L) 02/02/2016 0357   CL 109 02/02/2016 0357   CO2 25 02/02/2016 0357   GLUCOSE 112 (H) 02/02/2016 0357   BUN 6 02/02/2016 0357   CREATININE 0.65 02/02/2016 0357   CREATININE 0.69 01/11/2016 1537   CALCIUM 8.1 (L) 02/02/2016 0357   GFRNONAA >60 02/02/2016 0357   GFRAA >60 02/02/2016 0357   No results found for: HGBA1C No results found for: INSULIN CBC    Component Value Date/Time   WBC 3.3 (L) 02/02/2016 0357   RBC 4.27 02/02/2016 0357   HGB 11.5 (L) 02/02/2016 0357   HCT 36.2  02/02/2016 0357  PLT 114 (L) 02/02/2016 0357   MCV 84.8 02/02/2016 0357   MCH 26.9 02/02/2016 0357   MCHC 31.8 02/02/2016 0357   RDW 13.2 02/02/2016 0357   LYMPHSABS 0.4 (L) 01/30/2016 1935   MONOABS 0.1 01/30/2016 1935   EOSABS 0.0 01/30/2016 1935   BASOSABS 0.0 01/30/2016 1935   Iron/TIBC/Ferritin/ %Sat No results found for: IRON, TIBC, FERRITIN, IRONPCTSAT Lipid Panel  No results found for: CHOL, TRIG, HDL, CHOLHDL, VLDL, LDLCALC, LDLDIRECT Hepatic Function Panel     Component Value Date/Time   PROT 7.0 01/30/2016 1935   ALBUMIN 4.0 01/30/2016 1935   AST 30 01/30/2016 1935   ALT 30 01/30/2016 1935   ALKPHOS 76 01/30/2016 1935   BILITOT 0.8 01/30/2016 1935   No results found for: TSH  ECG  shows NSR with a rate of 54 BPM INDIRECT CALORIMETER done today shows a VO2 of 303 and a REE of 2107.    ASSESSMENT AND PLAN: Other fatigue - Plan: EKG 12-Lead, Vitamin B12, CBC With Differential, Comprehensive metabolic panel, Folate, Lipid Panel With LDL/HDL Ratio, T3, T4, free, TSH, VITAMIN D 25 Hydroxy (Vit-D Deficiency, Fractures), CANCELED: EKG 12-Lead  Shortness of breath on exertion  Type 2 diabetes mellitus without complication, without long-term current use of insulin (HCC) - Plan: Comprehensive metabolic panel, Hemoglobin A1c, Insulin, random, Microalbumin / creatinine urine ratio  History of pancreatitis - Plan: Amylase, Lipase  Depression screening  Class 2 obesity without serious comorbidity with body mass index (BMI) of 37.0 to 37.9 in adult, unspecified obesity type  PLAN:  Fatigue Alexandra Walker was informed that her fatigue may be related to obesity, depression or many other causes. Labs will be ordered, and in the meanwhile Namiko has agreed to work on diet, exercise and weight loss to help with fatigue. Proper sleep hygiene was discussed including the need for 7-8 hours of quality sleep each night. A sleep study was not ordered based on symptoms and Epworth  score.  Dyspnea on exertion Alexandra Walker's shortness of breath appears to be obesity related and exercise induced. She has agreed to work on weight loss and gradually increase exercise to treat her exercise induced shortness of breath. If Alexandra Walker follows our instructions and loses weight without improvement of her shortness of breath, we will plan to refer to pulmonology. We will monitor this condition regularly. Alexandra Walker agrees to this plan.  Diabetes II Alexandra Walker has been given extensive diabetes education by myself today including ideal fasting and post-prandial blood glucose readings, individual ideal Hgb A1c goals  and hypoglycemia prevention. We discussed the importance of good blood sugar control to decrease the likelihood of diabetic complications such as nephropathy, neuropathy, limb loss, blindness, coronary artery disease, and death. We discussed the importance of intensive lifestyle modification including diet, exercise and weight loss as the first line treatment for diabetes. We will check labs and Alexandra Walker agrees to continue with diet and exercise and will follow up at the agreed upon time.  History of Pancreatitis We will check labs for baseline amylase and lipase and weight loss was recommended to Alexandra Walker. She agrees to follow up with our clinic in 2 weeks.  Depression Screen Alexandra Walker had a strongly positive depression screening. Depression is commonly associated with obesity and often results in emotional eating behaviors. We will monitor this closely and work on CBT to help improve the non-hunger eating patterns. Referral to Psychology may be required if no improvement is seen as she continues in our clinic.  Obesity Alexandra Walker is currently in the  action stage of change and her goal is to continue with weight loss efforts She has agreed to follow the Category 2 plan  +100 calories Alexandra Walker has been instructed to work up to a goal of 150 minutes of combined cardio and strengthening exercise per week for weight  loss and overall health benefits. We discussed the following Behavioral Modification Strategies today: increasing lean protein intake, decrease eating out and decrease liquid calories  Alexandra Walker has agreed to follow up with our clinic in 2 weeks. She was informed of the importance of frequent follow up visits to maximize her success with intensive lifestyle modifications for her multiple health conditions. She was informed we would discuss her lab results at her next visit unless there is a critical issue that needs to be addressed sooner. Alexandra Walker agreed to keep her next visit at the agreed upon time to discuss these results.  I, Doreene Nest, am acting as scribe for Dennard Nip, MD  I have reviewed the above documentation for accuracy and completeness, and I agree with the above. -Dennard Nip, MD  OBESITY BEHAVIORAL INTERVENTION VISIT  Today's visit was # 1 out of 22.  Starting weight: 234 lbs Starting date: 10/28/16 Today's weight : 234 lbs Today's date: 10/28/2016 Total lbs lost to date: 0 (Patients must lose 7 lbs in the first 6 months to continue with counseling)   ASK: We discussed the diagnosis of obesity with Alexandra Walker today and Alexandra Walker agreed to give [name] permission to discuss obesity behavioral modification therapy today.  ASSESS: Alexandra Walker has the diagnosis of obesity and her BMI today is 36.8 Alexandra Walker is in the action stage of change   ADVISE: Alexandra Walker was educated on the multiple health risks of obesity as well as the benefit of weight loss to improve her health. She was advised of the need for long term treatment and the importance of lifestyle modifications.  AGREE: Multiple dietary modification options and treatment options were discussed and  Alexandra Walker agreed to follow the Category 2 plan +100 calories We discussed the following Behavioral Modification Strategies today: increasing lean protein intake, decrease eating out and decrease liquid calories

## 2016-10-29 ENCOUNTER — Telehealth: Payer: Self-pay

## 2016-10-29 LAB — COMPREHENSIVE METABOLIC PANEL
ALBUMIN: 4.3 g/dL (ref 3.5–5.5)
ALT: 14 IU/L (ref 0–32)
AST: 11 IU/L (ref 0–40)
Albumin/Globulin Ratio: 1.7 (ref 1.2–2.2)
Alkaline Phosphatase: 78 IU/L (ref 39–117)
BILIRUBIN TOTAL: 0.5 mg/dL (ref 0.0–1.2)
BUN / CREAT RATIO: 20 (ref 9–23)
BUN: 13 mg/dL (ref 6–24)
CALCIUM: 9.8 mg/dL (ref 8.7–10.2)
CO2: 27 mmol/L (ref 20–29)
CREATININE: 0.66 mg/dL (ref 0.57–1.00)
Chloride: 99 mmol/L (ref 96–106)
GFR, EST AFRICAN AMERICAN: 113 mL/min/{1.73_m2} (ref >59)
GFR, EST NON AFRICAN AMERICAN: 98 mL/min/{1.73_m2} (ref >59)
Globulin, Total: 2.5 g/dL (ref 1.5–4.5)
Glucose: 112 mg/dL — ABNORMAL HIGH (ref 65–99)
Potassium: 4.5 mmol/L (ref 3.5–5.2)
Sodium: 141 mmol/L (ref 134–144)
TOTAL PROTEIN: 6.8 g/dL (ref 6.0–8.5)

## 2016-10-29 LAB — CBC WITH DIFFERENTIAL
Basophils Absolute: 0 10*3/uL (ref 0.0–0.2)
Basos: 0 %
EOS (ABSOLUTE): 0.2 10*3/uL (ref 0.0–0.4)
Eos: 3 %
HEMOGLOBIN: 13.7 g/dL (ref 11.1–15.9)
Hematocrit: 42.6 % (ref 34.0–46.6)
IMMATURE GRANS (ABS): 0 10*3/uL (ref 0.0–0.1)
Immature Granulocytes: 0 %
LYMPHS: 25 %
Lymphocytes Absolute: 1.2 10*3/uL (ref 0.7–3.1)
MCH: 27.1 pg (ref 26.6–33.0)
MCHC: 32.2 g/dL (ref 31.5–35.7)
MCV: 84 fL (ref 79–97)
MONOCYTES: 8 %
Monocytes Absolute: 0.4 10*3/uL (ref 0.1–0.9)
NEUTROS ABS: 3.2 10*3/uL (ref 1.4–7.0)
Neutrophils: 64 %
RBC: 5.05 x10E6/uL (ref 3.77–5.28)
RDW: 13 % (ref 12.3–15.4)
WBC: 5 10*3/uL (ref 3.4–10.8)

## 2016-10-29 LAB — FOLATE: Folate: 9.3 ng/mL (ref >3.0)

## 2016-10-29 LAB — LIPASE: LIPASE: 63 U/L (ref 14–72)

## 2016-10-29 LAB — LIPID PANEL WITH LDL/HDL RATIO
Cholesterol, Total: 173 mg/dL (ref 100–199)
HDL: 43 mg/dL (ref >39)
LDL Calculated: 101 mg/dL — ABNORMAL HIGH (ref 0–99)
LDl/HDL Ratio: 2.3 ratio (ref 0.0–3.2)
TRIGLYCERIDES: 144 mg/dL (ref 0–149)
VLDL CHOLESTEROL CAL: 29 mg/dL (ref 5–40)

## 2016-10-29 LAB — MICROALBUMIN / CREATININE URINE RATIO
CREATININE, UR: 84.3 mg/dL
MICROALB/CREAT RATIO: 5 mg/g{creat} (ref 0.0–30.0)
MICROALBUM., U, RANDOM: 4.2 ug/mL

## 2016-10-29 LAB — VITAMIN B12: Vitamin B-12: 317 pg/mL (ref 232–1245)

## 2016-10-29 LAB — AMYLASE: AMYLASE: 32 U/L (ref 31–124)

## 2016-10-29 LAB — T4, FREE: Free T4: 1.26 ng/dL (ref 0.82–1.77)

## 2016-10-29 LAB — HEMOGLOBIN A1C
Est. average glucose Bld gHb Est-mCnc: 114 mg/dL
Hgb A1c MFr Bld: 5.6 % (ref 4.8–5.6)

## 2016-10-29 LAB — T3: T3 TOTAL: 109 ng/dL (ref 71–180)

## 2016-10-29 LAB — INSULIN, RANDOM: INSULIN: 10 u[IU]/mL (ref 2.6–24.9)

## 2016-10-29 LAB — VITAMIN D 25 HYDROXY (VIT D DEFICIENCY, FRACTURES): Vit D, 25-Hydroxy: 32.9 ng/mL (ref 30.0–100.0)

## 2016-10-29 LAB — TSH: TSH: 2.19 u[IU]/mL (ref 0.450–4.500)

## 2016-10-29 NOTE — Telephone Encounter (Signed)
Received ONO results for this pt. Please review and advise [name] on what to discuss with the pt regarding these results.

## 2016-10-30 ENCOUNTER — Telehealth: Payer: Self-pay | Admitting: Neurology

## 2016-10-30 NOTE — Telephone Encounter (Signed)
I reviewed the patient's ONO (overnight pulse oximetry report) from 10/23/16 on behalf of Dr. Brett Fairy.  The ONO was on room air and CPAP. Baseline oxygen saturation for the night was 91.6% and minimum oxygen saturation was 83%. Time below 88% saturation was 22.1 minutes for the night, total duration was 6 hours and 30 min for the test. It appears based on this data that patient's oxygen saturations are suboptimal at this time despite using CPAP for her sleep apnea. She may benefit from seeing a pulmonologist. Her baseline sleep study was interpreted by Dr. Annamaria Boots. We can request consultation to Dr. Annamaria Boots to look for underlying lung problem accounting for her lower oxygen saturations. We can defer till Dr. Keturah Barre. Is back from her leave.  Please update pt in the interim.

## 2016-11-05 NOTE — Telephone Encounter (Signed)
Patient called office returning RN's call.  Please call °

## 2016-11-05 NOTE — Telephone Encounter (Signed)
I called pt to discuss her ONO results. No answer, left a message asking her to call me back. 

## 2016-11-05 NOTE — Telephone Encounter (Signed)
I called pt and reviewed her ONO results with her. Pt says that she never actually saw Dr. Annamaria Boots; he was only the administrator of the sleep lab where she had her sleep study. She does not have a pulmonologist. She is wondering if Dr. Brett Fairy thinks she is just not breathing deeply enough at night ("brain problem"), or if there is an underlying lung problem. She would like Dr. Edwena Felty advice on how to proceed but knows that Dr. Brett Fairy is out of the office until next week.

## 2016-11-06 ENCOUNTER — Encounter: Payer: Self-pay | Admitting: Family Medicine

## 2016-11-06 ENCOUNTER — Ambulatory Visit: Payer: 59 | Admitting: Family Medicine

## 2016-11-06 ENCOUNTER — Ambulatory Visit (INDEPENDENT_AMBULATORY_CARE_PROVIDER_SITE_OTHER): Payer: 59 | Admitting: Family Medicine

## 2016-11-06 VITALS — BP 100/60 | HR 78 | Temp 99.6°F | Ht 66.0 in | Wt 233.2 lb

## 2016-11-06 DIAGNOSIS — N12 Tubulo-interstitial nephritis, not specified as acute or chronic: Secondary | ICD-10-CM

## 2016-11-06 DIAGNOSIS — R5081 Fever presenting with conditions classified elsewhere: Secondary | ICD-10-CM | POA: Diagnosis not present

## 2016-11-06 LAB — POC URINALSYSI DIPSTICK (AUTOMATED)
BILIRUBIN UA: NEGATIVE
Glucose, UA: NEGATIVE
KETONES UA: NEGATIVE
PH UA: 6 (ref 5.0–8.0)
RBC UA: NEGATIVE
Spec Grav, UA: >=1.03 — AB (ref 1.010–1.025)
Urobilinogen, UA: 0.2 E.U./dL

## 2016-11-06 LAB — CBC WITH DIFFERENTIAL/PLATELET
BASOS ABS: 0 {cells}/uL (ref 0–200)
BASOS PCT: 0 %
Eosinophils Absolute: 0 cells/uL — ABNORMAL LOW (ref 15–500)
Eosinophils Relative: 0 %
HCT: 41.9 % (ref 35.0–45.0)
Hemoglobin: 13.7 g/dL (ref 11.7–15.5)
LYMPHS ABS: 594 {cells}/uL — AB (ref 850–3900)
LYMPHS PCT: 9 %
MCH: 27.5 pg (ref 27.0–33.0)
MCHC: 32.7 g/dL (ref 32.0–36.0)
MCV: 84 fL (ref 80.0–100.0)
MONO ABS: 330 {cells}/uL (ref 200–950)
MONOS PCT: 5 %
MPV: 10.3 fL (ref 7.5–12.5)
NEUTROS ABS: 5676 {cells}/uL (ref 1500–7800)
NEUTROS PCT: 86 %
PLATELETS: 179 10*3/uL (ref 140–400)
RBC: 4.99 MIL/uL (ref 3.80–5.10)
RDW: 13.2 % (ref 11.0–15.0)
WBC: 6.6 10*3/uL (ref 3.8–10.8)

## 2016-11-06 MED ORDER — SULFAMETHOXAZOLE-TRIMETHOPRIM 800-160 MG PO TABS: 1.0000 | ORAL_TABLET | Freq: Two times a day (BID) | ORAL | 0 refills | Status: DC

## 2016-11-06 MED ORDER — CEFTRIAXONE SODIUM 1 G IJ SOLR
1.0000 g | Freq: Once | INTRAMUSCULAR | Status: AC
  Administered 2016-11-06: 1 g via INTRAMUSCULAR

## 2016-11-06 MED FILL — SULFAMETHOXAZOLE/TMP DS TAB: 800-160 | 7 days supply | Qty: 14 | Fill #0

## 2016-11-06 NOTE — Telephone Encounter (Signed)
She can FU with PCP and he may want to order PFTs and also if he agrees with pulm eval can request pulm consultation.

## 2016-11-06 NOTE — Progress Notes (Signed)
Chief Complaint  Patient presents with  . Fever    and chills last night,alittle low back pain-hx of Sepsis    Alexandra Walker is a 61 y.o. female here for possible UTI.  Duration: 1 day. Symptoms: fever, mild LBP; has hx of sepsis 2/2 untreated cystitis less than one year ago Denies: urinary frequency, hematuria, urinary hesitancy, urinary retention, nausea and vomiting, vaginal discharge Hx of recurrent UTI? No Denies new sexual partners.  ROS:  Constitutional: +fever GU: As noted in HPI MSK: + back pain Abd: Denies constipation or abdominal pain  Past Medical History:  Diagnosis Date  . 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 Arthritis   . Back pain   . Chronic depressive disorder   . Chronic edema    bilateral ankles  . Circadian rhythm sleep disorder   . Diabetes ([place])   . Fatty liver   . Gallbladder problem   . Ganglion of knee    left  . GERD (gastroesophageal reflux disease)    rare  . Glucose intolerance (impaired glucose tolerance)   . Hepatic steatosis   . HLA B27 (HLA B27 positive)   . Hx of pancreatitis   . Hyperlipidemia   . Hypertension   . Hypokalemia   . Hypomagnesemia   . IBS (irritable bowel syndrome)   . IBS (irritable bowel syndrome)   . Ischemic colitis ([place])   . Joint pain   . Kyphosis   . Lateral epicondylitis of right elbow   . Leg edema   . Lymphadenopathy   . Migraine headache    history of  . Morbid obesity ([place])   . Onycholysis   . Osteoarthritis   . PAC (premature atrial contraction)    PAC, PJC, managed well with BB  . Paronychia   . Paroxysmal atrial fibrillation ([place])    a. 01/2016 - brief episode during adm for severe sepsis.  Marland Kitchen PMS (premenstrual syndrome)   . PONV (postoperative nausea and vomiting)   . Prediabetes   . Pyelonephritis 01/2016  . Sacroiliitis ([place])   . Seasonal allergies   . Sepsis ([place]) 01/2016  . Sleep hypopnea   . SVT (supraventricular tachycardia) ([place])    a. 01/2016 - SVT, wide complex tachycardia,  and brief afib in setting of severe sepsis.  . Trigger ring finger of left hand   . Vitamin D deficiency   . Vitamin D deficiency    Family History  Problem Relation Age of Onset  . Heart attack Father   . Heart disease Father   . Coronary artery disease Mother   . Hypertension Mother   . Heart disease Mother   . Heart attack Mother   . Diabetes Mother   . Hyperlipidemia Mother   . Kidney disease Mother   . Depression Mother   . Sleep apnea Mother   . Obesity Mother   . Hypertension Sister   . Hypertension Brother   . Colon cancer Neg Hx    Social History   Social History  . Marital status: Divorced    Spouse name: N/A  . Number of children: 1  . Years of education: N/A   Occupational History  . [employer]   Social History Main Topics  . Smoking status: Never Smoker  . Smokeless tobacco: Never Used  . Alcohol use 0.0 oz/week     Comment: social  . Drug use: No    BP 100/60 (BP Location: Left Arm, Patient Position: Sitting, Cuff Size: Large)  Pulse 78   Temp 99.6 F (37.6 C) (Oral)   Ht '5\' 6"'  (1.676 m)   Wt 233 lb 3.2 oz (105.8 kg)   LMP 05/20/2008   SpO2 98%   BMI 37.64 kg/m  General: Awake, alert, appears stated age 1: MMM Heart: RRR Lungs: CTAB, normal respiratory effort, no accessory muscle usage Abd: BS+, soft, NT, ND, no masses or organomegaly MSK: +mild CVA tenderness, neg Lloyd's sign Psych: Age appropriate judgment and insight  Pyelonephritis - Plan: POCT Urinalysis Dipstick (Automated), sulfamethoxazole-trimethoprim (BACTRIM DS,SEPTRA DS) 800-160 MG tablet, Culture, blood (single) w Reflex to ID Panel, Culture, blood (single) w Reflex to ID Panel  Fever in other diseases - Plan: Culture, blood (single) w Reflex to ID Panel, Culture, blood (single) w Reflex to ID Panel, CANCELED: Culture, blood (routine x 2)  Orders as above. Given fever and mild back pain, will treat as if it were pyelonephritis. Rocephin given in  office, 7 day course of Bactrim. UA suggestive of infection with +Nitrites and LE. Pt requesting blood cultures given her hx of bacteremia with exact presenting symptoms. Will order 2 separate sites, 30 min apart. F/u prn.  The patient voiced understanding and agreement to the plan.  [place], DO 11/06/16 4:08 PM

## 2016-11-06 NOTE — Telephone Encounter (Signed)
Patient called office in reference to having a kidney infection and would like to know if she is able to add any oxygen to her CPAP machine in the mean time or if Dr feels patient needs it.  Please call

## 2016-11-06 NOTE — Patient Instructions (Signed)
We will update you when all of your labs come back if they are normal. If anything comes back abnormal along the way, we will let you know.  Let us know if you need anything.

## 2016-11-07 ENCOUNTER — Encounter: Payer: Self-pay | Admitting: Family Medicine

## 2016-11-07 DIAGNOSIS — R0902 Hypoxemia: Secondary | ICD-10-CM

## 2016-11-07 NOTE — Telephone Encounter (Signed)
I called pt. I advised her that Dr. Rexene Alberts recommends that she follow up with her PCP to consider PFTs and if they agree, the PCP can do a pulm referral for a consultation. Dr. Rexene Alberts did not prescribe O2 at this time.  Pt says that she will discuss this with her PCP but also wants a call from Dr. Brett Fairy upon her return to discuss why her oxygen is low and her recommendations. I advised her that Dr. Brett Fairy will return next week, but it may take several days for a return call. Pt verbalized understanding.

## 2016-11-07 NOTE — Telephone Encounter (Signed)
See other telephone note regarding ONO results.

## 2016-11-08 ENCOUNTER — Telehealth: Payer: Self-pay | Admitting: Internal Medicine

## 2016-11-08 NOTE — Telephone Encounter (Signed)
Spoke with pt, states she had an ONO on cpap and room air which showed her O2 dropped through the night.  Pt would like to be placed on O2 bled into her cpap.  I advised that we would not be able to order this for her at this time as she is not established with our clinic, and that the ordering physician who manages her sleep care should be able to order this for her.  Pt expressed understanding.  Nothing further needed at this time.

## 2016-11-09 LAB — URINE CULTURE

## 2016-11-10 ENCOUNTER — Other Ambulatory Visit: Payer: Self-pay | Admitting: Family Medicine

## 2016-11-10 MED ORDER — NITROFURANTOIN MONOHYD MACRO 100 MG PO CAPS: 100.0000 mg | ORAL_CAPSULE | Freq: Two times a day (BID) | ORAL | 0 refills | Status: AC

## 2016-11-10 NOTE — Progress Notes (Signed)
Macrobid called in to [place].

## 2016-11-11 MED FILL — NITROFURANTOIN MONO-MCR 100: 100 | 5 days supply | Qty: 10 | Fill #0

## 2016-11-12 NOTE — Telephone Encounter (Signed)
   I called Mrs. Boch and explained to her that her outside sleep study and her response to CPAP are not of concern to me. She has responded very well to CPAP with a reduction of AHI, she has continued to lose weight with the help of Dr. Migdalia Dk program, and her overnight pulse oximetry did not explain her daytime fatigue nor what she described as a propensity for sepsis. She has been having moderate hypoxemia with a total hypoxemia time of only 22 minutes in a 6-1/2 hour study. I would only have been concerned, should she has had over 30 minutes and prolonged periods of desaturation. She would not qualify for oxygen supplementation. The patient agreed and understood, she thanked me and reported that she has a pulmonology consult set up with Dr. Melvyn Novas in August. She will have some pulmonary function tests through primary care first and will then decide if she still needs to go and see pulmonology based on ou discussion at the results of the tests. Asencion Partridge Mitchelle Goerner,MD   I quoted hereby a phone note from Dr. Rexene Alberts from 10-30-16.  I reviewed the patient's ONO (overnight pulse oximetry report) from 10/23/16 on behalf of Dr. Brett Fairy.  The ONO was on room air and CPAP. Baseline oxygen saturation for the night was 91.6% and minimum oxygen saturation was 83%. Time below 88% saturation was 22.1 minutes for the night, total duration was 6 hours and 30 min for the test. It appears based on this data that patient's oxygen saturations are suboptimal at this time despite using CPAP for her sleep apnea. She may benefit from seeing a pulmonologist. Her baseline sleep study was interpreted by Dr. Annamaria Boots. We can request consultation to Dr. Annamaria Boots to look for underlying lung problem accounting for her lower oxygen saturations. We can defer till Dr. Keturah Barre. Is back from her leave.  Please update pt in the interim.

## 2016-11-14 ENCOUNTER — Ambulatory Visit (INDEPENDENT_AMBULATORY_CARE_PROVIDER_SITE_OTHER): Payer: 59 | Admitting: Family Medicine

## 2016-11-14 VITALS — BP 107/70 | HR 57 | Temp 98.8°F | Ht 66.0 in | Wt 227.0 lb

## 2016-11-14 DIAGNOSIS — IMO0001 Reserved for inherently not codable concepts without codable children: Secondary | ICD-10-CM

## 2016-11-14 DIAGNOSIS — Z6836 Body mass index (BMI) 36.0-36.9, adult: Secondary | ICD-10-CM | POA: Diagnosis not present

## 2016-11-14 DIAGNOSIS — E559 Vitamin D deficiency, unspecified: Secondary | ICD-10-CM

## 2016-11-14 DIAGNOSIS — E669 Obesity, unspecified: Secondary | ICD-10-CM

## 2016-11-14 DIAGNOSIS — E119 Type 2 diabetes mellitus without complications: Secondary | ICD-10-CM

## 2016-11-14 LAB — CULTURE, BLOOD (SINGLE)
Organism ID, Bacteria: NO GROWTH
Organism ID, Bacteria: NO GROWTH

## 2016-11-14 MED ORDER — VITAMIN D (ERGOCALCIFEROL) 1.25 MG (50000 UNIT) PO CAPS: 50000.0000 [IU] | ORAL_CAPSULE | ORAL | 0 refills | Status: DC

## 2016-11-14 NOTE — Progress Notes (Signed)
Office: [phone]  /  Fax: [phone]   HPI:   Chief Complaint: OBESITY Alexandra Walker is here to discuss her progress with her obesity treatment plan. She is on the  follow the Category 2 plan and is following her eating plan approximately 95 % of the time. She states she is exercising 2,000 steps x 2 times, 1 time per week. Alexandra Walker has done well with weight loss on the category 2 plan but she is already getting bored with food, especially breakfast. Her weight is 227 lb (103 kg) today and has had a weight loss of 7 pounds over a period of 2 weeks since her last visit. She has lost 7 lbs since starting treatment with [name].  Vitamin D deficiency Alexandra Walker has a diagnosis of vitamin D deficiency. She is currently taking OTC vit D 2,000 units qd but is not quite at goal. She admits fatigue and denies nausea, vomiting or muscle weakness.  Diabetes II Alexandra Walker has a diagnosis of diabetes type II. Alexandra Walker's fasting BGs were in the 130's to 140's and now is approximately 105 (no blood sugar log today) with diet and metformin and denies any hypoglycemic episodes. She has been working on intensive lifestyle modifications including diet, exercise, and weight loss to help control her blood glucose levels.   ALLERGIES: Allergies  Allergen Reactions  . Ace Inhibitors Other (See Comments)    coughing  . Oxycodone-Acetaminophen Nausea And Vomiting  . Percocet [Oxycodone-Acetaminophen] Nausea And Vomiting  . Vicodin [Hydrocodone-Acetaminophen] Nausea And Vomiting  . Victoza [Liraglutide] Other (See Comments)    pancreatitis    MEDICATIONS: Current Outpatient Prescriptions on File Prior to Visit  Medication Sig Dispense Refill  . aspirin 325 MG tablet Take 325 mg by mouth daily.    . Blood Glucose Monitoring Suppl (FREESTYLE LITE) DEVI Check blood sugar 2-3 times per day.  Dx:E11.21 1 each 0  . cholecalciferol (VITAMIN D) 1000 UNITS tablet Take 1,000 Units by mouth daily.    . cyclobenzaprine (FLEXERIL) 5 MG  tablet Take 1 tablet (5 mg total) by mouth at bedtime as needed for muscle spasms. 30 tablet 2  . glucose blood (FREESTYLE LITE) test strip Check blood sugar 2-3 times per day.  Dx:E11.21. 100 each 12  . hydrochlorothiazide (HYDRODIURIL) 25 MG tablet Take 1 tablet (25 mg total) by mouth daily. Once daily 30 tablet 5  . Lancets (FREESTYLE) lancets Check blood sugar 2-3 times per day.  Dx:E11.21 100 each 12  . losartan (COZAAR) 50 MG tablet Take 50 mg by mouth daily.    . Magnesium 500 MG CAPS Take 1 capsule by mouth at bedtime.    . metFORMIN (GLUCOPHAGE) 500 MG tablet Take 1,000 mg by mouth 2 (two) times daily with a meal.    . metoprolol succinate (TOPROL-XL) 50 MG 24 hr tablet Take 50 mg by mouth daily. Take with or immediately following a meal.    . nitrofurantoin, macrocrystal-monohydrate, (MACROBID) 100 MG capsule Take 1 capsule (100 mg total) by mouth 2 (two) times daily. 10 capsule 0  . Omega-3 Fatty Acids (FISH OIL PO) Take 1 tablet by mouth daily.    Marland Kitchen saccharomyces boulardii (FLORASTOR) 250 MG capsule Take 1 capsule (250 mg total) by mouth 2 (two) times daily. 60 capsule 0  . simvastatin (ZOCOR) 40 MG tablet Take 40 mg by mouth every evening.    . Turmeric 450 MG CAPS Take 900 mg by mouth daily.     No current facility-administered medications on file prior to  visit.     PAST MEDICAL HISTORY: Past Medical History:  Diagnosis Date  . 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 Arthritis   . Back pain   . Chronic depressive disorder   . Chronic edema    bilateral ankles  . Circadian rhythm sleep disorder   . Diabetes ([place])   . Fatty liver   . Gallbladder problem   . Ganglion of knee    left  . GERD (gastroesophageal reflux disease)    rare  . Glucose intolerance (impaired glucose tolerance)   . Hepatic steatosis   . HLA B27 (HLA B27 positive)   . Hx of pancreatitis   . Hyperlipidemia   . Hypertension   . Hypokalemia   . Hypomagnesemia   . IBS (irritable bowel syndrome)   .  IBS (irritable bowel syndrome)   . Ischemic colitis ([place])   . Joint pain   . Kyphosis   . Lateral epicondylitis of right elbow   . Leg edema   . Lymphadenopathy   . Migraine headache    history of  . Morbid obesity ([place])   . Onycholysis   . Osteoarthritis   . PAC (premature atrial contraction)    PAC, PJC, managed well with BB  . Paronychia   . Paroxysmal atrial fibrillation ([place])    a. 01/2016 - brief episode during adm for severe sepsis.  Marland Kitchen PMS (premenstrual syndrome)   . PONV (postoperative nausea and vomiting)   . Prediabetes   . Pyelonephritis 01/2016  . Sacroiliitis ([place])   . Seasonal allergies   . Sepsis ([place]) 01/2016  . Sleep hypopnea   . SVT (supraventricular tachycardia) ([address])    a. 01/2016 - SVT, wide complex tachycardia, and brief afib in setting of severe sepsis.  . Trigger ring finger of left hand   . Vitamin D deficiency   . Vitamin D deficiency     PAST SURGICAL HISTORY: Past Surgical History:  Procedure Laterality Date  . abdominal laparascopic  92  . BLADDER SURGERY    . BREAST SURGERY  2003   bx lt-neg  . CERVICAL CONE BIOPSY    . CHOLECYSTECTOMY N/A 10/26/2014   Procedure: LAPAROSCOPIC CHOLECYSTECTOMY;  Surgeon: Coralie Keens, MD;  Location: [facility];  Service: General;  Laterality: N/A;  . COLONOSCOPY W/ BIOPSIES  multiple  . DIAGNOSTIC LAPAROSCOPY    . DILATION AND CURETTAGE OF UTERUS  07,11  . ESOPHAGOGASTRODUODENOSCOPY  multiple  . KNEE ARTHROSCOPY  07,10   left  . LAPAROSCOPIC VAGINAL HYSTERECTOMY WITH SALPINGO OOPHORECTOMY Bilateral 10/03/2015   Procedure: LAPAROSCOPIC ASSISTED VAGINAL HYSTERECTOMY WITH SALPINGECTOMY;  Surgeon: Eldred Manges, MD;  Location: [facility];  Service: Gynecology;  Laterality: Bilateral;  . left finger surgery    . MASS EXCISION Left 12/24/2012   Procedure: LEFT EXCISION OF PALMAR MASS X TWO;  Surgeon: Roseanne Kaufman, MD;  Location: [facility];  Service: Orthopedics;  Laterality: Left;     SOCIAL HISTORY: Social History  Substance Use Topics  . Smoking status: Never Smoker  . Smokeless tobacco: Never Used  . Alcohol use 0.0 oz/week     Comment: social    FAMILY HISTORY: Family History  Problem Relation Age of Onset  . Heart attack Father   . Heart disease Father   . Coronary artery disease Mother   . Hypertension Mother   . Heart disease Mother   . Heart attack Mother   . Diabetes Mother   . Hyperlipidemia Mother   .  Kidney disease Mother   . Depression Mother   . Sleep apnea Mother   . Obesity Mother   . Hypertension Sister   . Hypertension Brother   . Colon cancer Neg Hx     ROS: Review of Systems  Constitutional: Positive for malaise/fatigue and weight loss.  Gastrointestinal: Negative for nausea and vomiting.  Musculoskeletal:       Negative muscle weakness  Endo/Heme/Allergies:       Negative hypoglycemia    PHYSICAL EXAM: Blood pressure 107/70, pulse (!) 57, temperature 98.8 F (37.1 C), temperature source Oral, height '5\' 6"'  (1.676 m), weight 227 lb (103 kg), last menstrual period 05/20/2008, SpO2 98 %. Body mass index is 36.64 kg/m. Physical Exam  Constitutional: She is oriented to person, place, and time. She appears well-developed and well-nourished.  Cardiovascular: Normal rate.   Pulmonary/Chest: Effort normal.  Musculoskeletal: Normal range of motion.  Neurological: She is oriented to person, place, and time.  Skin: Skin is warm and dry.  Psychiatric: She has a normal mood and affect. Her behavior is normal.  Vitals reviewed.   RECENT LABS AND TESTS: BMET    Component Value Date/Time   NA 141 10/28/2016 1031   K 4.5 10/28/2016 1031   CL 99 10/28/2016 1031   CO2 27 10/28/2016 1031   GLUCOSE 112 (H) 10/28/2016 1031   GLUCOSE 112 (H) 02/02/2016 0357   BUN 13 10/28/2016 1031   CREATININE 0.66 10/28/2016 1031   CREATININE 0.69 01/11/2016 1537   CALCIUM 9.8 10/28/2016 1031   GFRNONAA 98 10/28/2016 1031   GFRAA 113  10/28/2016 1031   Lab Results  Component Value Date   HGBA1C 5.6 10/28/2016   Lab Results  Component Value Date   INSULIN 10.0 10/28/2016   CBC    Component Value Date/Time   WBC 6.6 11/06/2016 1623   RBC 4.99 11/06/2016 1623   HGB 13.7 11/06/2016 1623   HGB 13.7 10/28/2016 1031   HCT 41.9 11/06/2016 1623   HCT 42.6 10/28/2016 1031   PLT 179 11/06/2016 1623   MCV 84.0 11/06/2016 1623   MCV 84 10/28/2016 1031   MCH 27.5 11/06/2016 1623   MCHC 32.7 11/06/2016 1623   RDW 13.2 11/06/2016 1623   RDW 13.0 10/28/2016 1031   LYMPHSABS 594 (L) 11/06/2016 1623   LYMPHSABS 1.2 10/28/2016 1031   MONOABS 330 11/06/2016 1623   EOSABS 0 (L) 11/06/2016 1623   EOSABS 0.2 10/28/2016 1031   BASOSABS 0 11/06/2016 1623   BASOSABS 0.0 10/28/2016 1031   Iron/TIBC/Ferritin/ %Sat No results found for: IRON, TIBC, FERRITIN, IRONPCTSAT Lipid Panel     Component Value Date/Time   CHOL 173 10/28/2016 1031   TRIG 144 10/28/2016 1031   HDL 43 10/28/2016 1031   LDLCALC 101 (H) 10/28/2016 1031   Hepatic Function Panel     Component Value Date/Time   PROT 6.8 10/28/2016 1031   ALBUMIN 4.3 10/28/2016 1031   AST 11 10/28/2016 1031   ALT 14 10/28/2016 1031   ALKPHOS 78 10/28/2016 1031   BILITOT 0.5 10/28/2016 1031      Component Value Date/Time   TSH 2.190 10/28/2016 1031    ASSESSMENT AND PLAN: Vitamin D deficiency - Plan: Vitamin D, Ergocalciferol, (DRISDOL) 50000 units CAPS capsule  Type 2 diabetes mellitus without complication, without long-term current use of insulin (HCC)  Class 2 obesity with serious comorbidity and body mass index (BMI) of 36.0 to 36.9 in adult, unspecified obesity type  PLAN:  Vitamin D Deficiency  Alexandra Walker was informed that low vitamin D levels contributes to fatigue and are associated with obesity, breast, and colon cancer. She agrees to start to take prescription Vit D '@50' ,000 IU every week #4 with no refills and will follow up for routine testing of vitamin  D, at least 2-3 times per year. She was informed of the risk of over-replacement of vitamin D and agrees to not increase her dose unless he discusses this with [name] first.  Diabetes II Alexandra Walker has been given extensive diabetes education by myself today including ideal fasting and post-prandial blood glucose readings, individual ideal Hgb A1c goals  and hypoglycemia prevention. We discussed the importance of good blood sugar control to decrease the likelihood of diabetic complications such as nephropathy, neuropathy, limb loss, blindness, coronary artery disease, and death. We discussed the importance of intensive lifestyle modification including diet, exercise and weight loss as the first line treatment for diabetes. Alexandra Walker agrees to continue metformin and will follow up at the agreed upon time.  Obesity Alexandra Walker is currently in the action stage of change. As such, her goal is to continue with weight loss efforts She has agreed to keep a food journal with 250 to 350 calories and 20+ grams of protein at breakfast and follow the Category 2 plan Alexandra Walker has been instructed to work up to a goal of 150 minutes of combined cardio and strengthening exercise per week for weight loss and overall health benefits. We discussed the following Behavioral Modification Strategies today: keeping healthy foods in the home, increasing lean protein intake and decrease eating out  Alexandra Walker has agreed to follow up with our clinic in 2 weeks. She was informed of the importance of frequent follow up visits to maximize her success with intensive lifestyle modifications for her multiple health conditions.  I, Doreene Nest, am acting as transcriptionist for Dennard Nip, MD  I have reviewed the above documentation for accuracy and completeness, and I agree with the above. -Dennard Nip, MD  OBESITY BEHAVIORAL INTERVENTION VISIT  Today's visit was # 2 out of 20.  Starting weight: 234 lbs Starting date: 10/28/16 Today's weight :  227 lbs Today's date: 11/14/2016 Total lbs lost to date: 7 (Patients must lose 7 lbs in the first 6 months to continue with counseling)   ASK: We discussed the diagnosis of obesity with Alexandra Walker today and Alexandra Walker agreed to give [name] permission to discuss obesity behavioral modification therapy today.  ASSESS: Alexandra Walker has the diagnosis of obesity and her BMI today is 37.7 Alexandra Walker is in the action stage of change   ADVISE: Alexandra Walker was educated on the multiple health risks of obesity as well as the benefit of weight loss to improve her health. She was advised of the need for long term treatment and the importance of lifestyle modifications.  AGREE: Multiple dietary modification options and treatment options were discussed and  Alexandra Walker agreed to keep a food journal with 250 to 350 calories and 20+ grams of protein at breakfast daily and follow the Category 2 plan We discussed the following Behavioral Modification Strategies today: keeping healthy foods in the home, increasing lean protein intake and decrease eating out

## 2016-11-15 ENCOUNTER — Ambulatory Visit: Payer: 59 | Admitting: Internal Medicine

## 2016-11-15 DIAGNOSIS — R0902 Hypoxemia: Secondary | ICD-10-CM

## 2016-11-15 MED FILL — VIT D2 1.25 MG (50,000 UNIT: 1.25 MG | 28 days supply | Qty: 4 | Fill #0

## 2016-11-15 NOTE — Progress Notes (Signed)
PFT done today. 

## 2016-11-18 ENCOUNTER — Telehealth: Payer: Self-pay | Admitting: Neurology

## 2016-11-18 NOTE — Telephone Encounter (Signed)
No )2 needed. Patient aware.

## 2016-11-18 NOTE — Telephone Encounter (Signed)
Dear Nurse Mardene Celeste,   Overnight pulse oximetry dated 10-23-16 was already discussed with the patient. Mrs. Totaro had only 2.2 minutes of consecutive time at less than 88% oxygen saturation, no evidence of tachybradycardia arrhythmia, and a total time of 22 minutes at or under 88% SPO2. She would not qualify for oxygen supplementation. She has already been sent to a pulmonologist by primary care, following up on these results.   Than you. CD

## 2016-11-19 NOTE — Telephone Encounter (Signed)
Noted  

## 2016-11-26 ENCOUNTER — Ambulatory Visit (INDEPENDENT_AMBULATORY_CARE_PROVIDER_SITE_OTHER): Payer: 59 | Admitting: Family Medicine

## 2016-11-26 VITALS — BP 130/73 | HR 47 | Ht 66.0 in | Wt 230.0 lb

## 2016-11-26 DIAGNOSIS — Z9189 Other specified personal risk factors, not elsewhere classified: Secondary | ICD-10-CM

## 2016-11-26 DIAGNOSIS — Z6837 Body mass index (BMI) 37.0-37.9, adult: Secondary | ICD-10-CM

## 2016-11-26 DIAGNOSIS — IMO0001 Reserved for inherently not codable concepts without codable children: Secondary | ICD-10-CM

## 2016-11-26 DIAGNOSIS — E669 Obesity, unspecified: Secondary | ICD-10-CM | POA: Diagnosis not present

## 2016-11-26 DIAGNOSIS — R7303 Prediabetes: Secondary | ICD-10-CM

## 2016-11-26 NOTE — Progress Notes (Signed)
° °Office: [phone]  /  Fax: [phone] ° ° °HPI:  ° °Chief Complaint: OBESITY °Alexandra Walker is here to discuss her progress with her obesity treatment plan. She is on the  keep a food journal with 250 to 350 calories and 20+ grams of protein  and follow the Category 2 plan and is following her eating plan approximately 99 % of the time. She states she is exercising yard work for 60 to 120 minutes 3 to 4 times per week. °Alexandra Walker is retaining fluid today and is frustrated she didn't lose weight. She states she is following plan very closely and frustrated she is still retaining fluid. She decreased H2O intake. °Her weight is 230 lb (104.3 kg) today and has had a weight gain of 3 pounds over a period of 2 weeks since her last visit. She has lost 4 lbs since starting treatment with us. ° °Pre-Diabetes °Alexandra Walker has a diagnosis of pre-diabetes based on her elevated Hgb A1c and was informed this puts her at greater risk of developing diabetes. She is taking metformin currently and continues to work on diet and exercise to decrease risk of diabetes. Polyphagia is improved and she denies nausea or hypoglycemia. ° °At risk for diabetes °Alexandra Walker is at higher than average risk for developing diabetes due to her obesity and pre-diabetes. She currently denies polyuria or polydipsia. ° °ALLERGIES: °Allergies  °Allergen Reactions  °• Ace Inhibitors Other (See Comments)  °  coughing  °• Oxycodone-Acetaminophen Nausea And Vomiting  °• Percocet [Oxycodone-Acetaminophen] Nausea And Vomiting  °• Vicodin [Hydrocodone-Acetaminophen] Nausea And Vomiting  °• Victoza [Liraglutide] Other (See Comments)  °  pancreatitis  ° ° °MEDICATIONS: °Current Outpatient Prescriptions on File Prior to Visit  °Medication Sig Dispense Refill  °• aspirin 325 MG tablet Take 325 mg by mouth daily.    °• Blood Glucose Monitoring Suppl (FREESTYLE LITE) DEVI Check blood sugar 2-3 times per day.  Dx:E11.21 1 each 0  °• cholecalciferol (VITAMIN D) 1000 UNITS tablet Take  1,000 Units by mouth daily.    °• cyclobenzaprine (FLEXERIL) 5 MG tablet Take 1 tablet (5 mg total) by mouth at bedtime as needed for muscle spasms. 30 tablet 2  °• glucose blood (FREESTYLE LITE) test strip Check blood sugar 2-3 times per day.  Dx:E11.21. 100 each 12  °• hydrochlorothiazide (HYDRODIURIL) 25 MG tablet Take 1 tablet (25 mg total) by mouth daily. Once daily 30 tablet 5  °• Lancets (FREESTYLE) lancets Check blood sugar 2-3 times per day.  Dx:E11.21 100 each 12  °• losartan (COZAAR) 50 MG tablet Take 50 mg by mouth daily.    °• Magnesium 500 MG CAPS Take 1 capsule by mouth at bedtime.    °• metFORMIN (GLUCOPHAGE) 500 MG tablet Take 1,000 mg by mouth 2 (two) times daily with a meal.    °• metoprolol succinate (TOPROL-XL) 50 MG 24 hr tablet Take 50 mg by mouth daily. Take with or immediately following a meal.    °• Omega-3 Fatty Acids (FISH OIL PO) Take 1 tablet by mouth daily.    °• saccharomyces boulardii (FLORASTOR) 250 MG capsule Take 1 capsule (250 mg total) by mouth 2 (two) times daily. 60 capsule 0  °• simvastatin (ZOCOR) 40 MG tablet Take 40 mg by mouth every evening.    °• Turmeric 450 MG CAPS Take 900 mg by mouth daily.    °• Vitamin D, Ergocalciferol, (DRISDOL) 50000 units CAPS capsule Take 1 capsule (50,000 Units total) by mouth every 7 (seven) days. 4   4 capsule 0   No current facility-administered medications on file prior to visit.     PAST MEDICAL HISTORY: Past Medical History:  Diagnosis Date   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HCC)    Arthritis    Back pain    Chronic depressive disorder    Chronic edema    bilateral ankles   Circadian rhythm sleep disorder    Diabetes (HCC)    Fatty liver    Gallbladder problem    Ganglion of knee    left   GERD (gastroesophageal reflux disease)    rare   Glucose intolerance (impaired glucose tolerance)    Hepatic steatosis    HLA B27 (HLA B27 positive)    Hx of pancreatitis    Hyperlipidemia    Hypertension     Hypokalemia    Hypomagnesemia    IBS (irritable bowel syndrome)    IBS (irritable bowel syndrome)    Ischemic colitis (HCC)    Joint pain    Kyphosis    Lateral epicondylitis of right elbow    Leg edema    Lymphadenopathy    Migraine headache    history of   Morbid obesity ([place])    Onycholysis    Osteoarthritis    PAC (premature atrial contraction)    PAC, PJC, managed well with BB   Paronychia    Paroxysmal atrial fibrillation ([place])    a. 01/2016 - brief episode during adm for severe sepsis.   PMS (premenstrual syndrome)    PONV (postoperative nausea and vomiting)    Prediabetes    Pyelonephritis 01/2016   Sacroiliitis ([place])    Seasonal allergies    Sepsis ([place]) 01/2016   Sleep hypopnea    SVT (supraventricular tachycardia) ([place])    a. 01/2016 - SVT, wide complex tachycardia, and brief afib in setting of severe sepsis.   Trigger ring finger of left hand    Vitamin D deficiency    Vitamin D deficiency     PAST SURGICAL HISTORY: Past Surgical History:  Procedure Laterality Date   abdominal laparascopic  92   BLADDER SURGERY     BREAST SURGERY  2003   bx lt-neg   CERVICAL CONE BIOPSY     CHOLECYSTECTOMY N/A 10/26/2014   Procedure: LAPAROSCOPIC CHOLECYSTECTOMY;  Surgeon: Coralie Keens, MD;  Location: [place];  Service: General;  Laterality: N/A;   COLONOSCOPY W/ BIOPSIES  multiple   DIAGNOSTIC LAPAROSCOPY     DILATION AND CURETTAGE OF UTERUS  07,11   ESOPHAGOGASTRODUODENOSCOPY  multiple   KNEE ARTHROSCOPY  07,10   left   LAPAROSCOPIC VAGINAL HYSTERECTOMY WITH SALPINGO OOPHORECTOMY Bilateral 10/03/2015   Procedure: LAPAROSCOPIC ASSISTED VAGINAL HYSTERECTOMY WITH SALPINGECTOMY;  Surgeon: Eldred Manges, MD;  Location: [facility];  Service: Gynecology;  Laterality: Bilateral;   left finger surgery     MASS EXCISION Left 12/24/2012   Procedure: LEFT EXCISION OF PALMAR MASS X TWO;  Surgeon: Roseanne Kaufman, MD;   Location: [facility];  Service: Orthopedics;  Laterality: Left;    SOCIAL HISTORY: Social History  Substance Use Topics   Smoking status: Never Smoker   Smokeless tobacco: Never Used   Alcohol use 0.0 oz/week     Comment: social    FAMILY HISTORY: Family History  Problem Relation Age of Onset   Heart attack Father    Heart disease Father    Coronary artery disease Mother    Hypertension Mother    Heart disease Mother    Heart attack Mother  Diabetes Mother    Hyperlipidemia Mother    Kidney disease Mother    Depression Mother    Sleep apnea Mother    Obesity Mother    Hypertension Sister    Hypertension Brother    Colon cancer Neg Hx     ROS: Review of Systems  Constitutional: Negative for weight loss.  Gastrointestinal: Negative for nausea.  Genitourinary: Negative for frequency.  Endo/Heme/Allergies: Negative for polydipsia.       Polyphagia Negative hypoglycemia    PHYSICAL EXAM: Blood pressure 130/73, pulse (!) 47, height 5' 6" (1.676 m), weight 230 lb (104.3 kg), last menstrual period 05/20/2008, SpO2 100 %. Body mass index is 37.12 kg/m. Physical Exam  Constitutional: She is oriented to person, place, and time. She appears well-developed and well-nourished.  Cardiovascular: Normal rate.   Pulmonary/Chest: Effort normal.  Musculoskeletal: Normal range of motion.  Neurological: She is oriented to person, place, and time.  Skin: Skin is warm and dry.  Psychiatric: She has a normal mood and affect. Her behavior is normal.  Vitals reviewed.   RECENT LABS AND TESTS: BMET    Component Value Date/Time   NA 141 10/28/2016 1031   K 4.5 10/28/2016 1031   CL 99 10/28/2016 1031   CO2 27 10/28/2016 1031   GLUCOSE 112 (H) 10/28/2016 1031   GLUCOSE 112 (H) 02/02/2016 0357   BUN 13 10/28/2016 1031   CREATININE 0.66 10/28/2016 1031   CREATININE 0.69 01/11/2016 1537   CALCIUM 9.8 10/28/2016 1031   GFRNONAA 98 10/28/2016 1031    GFRAA 113 10/28/2016 1031   Lab Results  Component Value Date   HGBA1C 5.6 10/28/2016   Lab Results  Component Value Date   INSULIN 10.0 10/28/2016   CBC    Component Value Date/Time   WBC 6.6 11/06/2016 1623   RBC 4.99 11/06/2016 1623   HGB 13.7 11/06/2016 1623   HGB 13.7 10/28/2016 1031   HCT 41.9 11/06/2016 1623   HCT 42.6 10/28/2016 1031   PLT 179 11/06/2016 1623   MCV 84.0 11/06/2016 1623   MCV 84 10/28/2016 1031   MCH 27.5 11/06/2016 1623   MCHC 32.7 11/06/2016 1623   RDW 13.2 11/06/2016 1623   RDW 13.0 10/28/2016 1031   LYMPHSABS 594 (L) 11/06/2016 1623   LYMPHSABS 1.2 10/28/2016 1031   MONOABS 330 11/06/2016 1623   EOSABS 0 (L) 11/06/2016 1623   EOSABS 0.2 10/28/2016 1031   BASOSABS 0 11/06/2016 1623   BASOSABS 0.0 10/28/2016 1031   Iron/TIBC/Ferritin/ %Sat No results found for: IRON, TIBC, FERRITIN, IRONPCTSAT Lipid Panel     Component Value Date/Time   CHOL 173 10/28/2016 1031   TRIG 144 10/28/2016 1031   HDL 43 10/28/2016 1031   LDLCALC 101 (H) 10/28/2016 1031   Hepatic Function Panel     Component Value Date/Time   PROT 6.8 10/28/2016 1031   ALBUMIN 4.3 10/28/2016 1031   AST 11 10/28/2016 1031   ALT 14 10/28/2016 1031   ALKPHOS 78 10/28/2016 1031   BILITOT 0.5 10/28/2016 1031      Component Value Date/Time   TSH 2.190 10/28/2016 1031    ASSESSMENT AND PLAN: Prediabetes  At risk for diabetes mellitus  Class 2 obesity with serious comorbidity and body mass index (BMI) of 37.0 to 37.9 in adult, unspecified obesity type  PLAN:  Pre-Diabetes Sedalia will continue to work on weight loss, exercise, and decreasing simple carbohydrates in her diet to help decrease the risk of diabetes and she will  work on decreasing high glycemic index food. We dicussed metformin including benefits and risks. She was informed that eating too many simple carbohydrates or too many calories at one sitting increases the likelihood of GI side effects. Laporshia agrees  to continue metformin for now and a prescription was not written today. Adya agreed to follow up with [name] as directed to monitor her progress.  Diabetes risk counselling Michelina was given extended (at least 15 minutes) diabetes prevention counseling today. She is 61 y.o. female and has risk factors for diabetes including obesity and pre-diabetes. We discussed intensive lifestyle modifications today with an emphasis on weight loss as well as increasing exercise and decreasing simple carbohydrates in her diet.  Obesity Indiah is currently in the action stage of change. As such, her goal is to continue with weight loss efforts She has agreed to follow the Category 2 plan Zaylia has been instructed to work up to a goal of 150 minutes of combined cardio and strengthening exercise per week for weight loss and overall health benefits. We discussed the following Behavioral Modification Strategies today: increase H2O intake, meal planning & cooking strategies, increasing lean protein intake and decreasing sodium intake  Lacosta has agreed to follow up with our clinic in 2 weeks. She was informed of the importance of frequent follow up visits to maximize her success with intensive lifestyle modifications for her multiple health conditions.  I, Doreene Nest, am acting as transcriptionist for Dennard Nip, MD  I have reviewed the above documentation for accuracy and completeness, and I agree with the above. -Dennard Nip, MD   OBESITY BEHAVIORAL INTERVENTION VISIT  Today's visit was # 3 out of 22.  Starting weight: 234 lbs Starting date: 10/28/16 Today's weight : 230 lbs Today's date: 11/26/2016 Total lbs lost to date: 4 (Patients must lose 7 lbs in the first 6 months to continue with counseling)   ASK: We discussed the diagnosis of obesity with Alexandra Walker today and Alexandra Walker agreed to give [name] permission to discuss obesity behavioral modification therapy today.  ASSESS: Alexandra Walker has the diagnosis of  obesity and her BMI today is 72.2 Alexandra Walker is in the action stage of change   ADVISE: Alexandra Walker was educated on the multiple health risks of obesity as well as the benefit of weight loss to improve her health. She was advised of the need for long term treatment and the importance of lifestyle modifications.  AGREE: Multiple dietary modification options and treatment options were discussed and  Alexandra Walker agreed to follow the Category 2 plan We discussed the following Behavioral Modification Strategies today: increase H2O intake, meal planning & cooking strategies, increasing lean protein intake and decreasing sodium intake

## 2016-11-30 ENCOUNTER — Encounter: Payer: Self-pay | Admitting: Family Medicine

## 2016-12-02 ENCOUNTER — Ambulatory Visit (INDEPENDENT_AMBULATORY_CARE_PROVIDER_SITE_OTHER): Payer: 59 | Admitting: Family Medicine

## 2016-12-03 NOTE — Telephone Encounter (Signed)
Relation to pt: self  Call back number: [phone]   D.O.D Alexandra Walker   Reason for call:  Patient checking on the status of 11/30/16 my chart message patient states she's completely out of medication. Patient states her main concern is her BP medication please refill today if not all medication request, please advise patient directly would like to speak with nurse.

## 2016-12-03 NOTE — Telephone Encounter (Signed)
Spoke with pt. Advised her that HCTZ should have refills on hold at [facility] from 09/18/16 and to contact pharmacy for that Rx. Pt reports that she has 3 tabs of metoprolol and losartan left. States she was not previously seeing cardiology and had been getting these from previous PCP. Pt is requesting refills of metoprolol XL, losartan and simvastatin. States she has plenty of metformin on hand. Rxs pended. Please advise?

## 2016-12-04 MED ORDER — SIMVASTATIN 40 MG PO TABS
40.0000 mg | ORAL_TABLET | Freq: Every evening | ORAL | 5 refills | Status: DC
Start: 2016-12-04 — End: 2017-07-16

## 2016-12-04 MED ORDER — METOPROLOL SUCCINATE ER 50 MG PO TB24: 50.0000 mg | ORAL_TABLET | Freq: Every day | ORAL | 5 refills | Status: DC

## 2016-12-04 MED ORDER — LOSARTAN POTASSIUM 50 MG PO TABS: 50.0000 mg | ORAL_TABLET | Freq: Every day | ORAL | 5 refills | Status: DC

## 2016-12-04 MED FILL — LOSARTAN POTASSIUM 50 MG TA: 50 | 90 days supply | Qty: 90 | Fill #0

## 2016-12-04 MED FILL — SIMVASTATIN 40 MG TABLET: 40 | 90 days supply | Qty: 90 | Fill #0

## 2016-12-04 MED FILL — METOPROLOL SUCC ER 50 MG TA: 50 | 90 days supply | Qty: 90 | Fill #0

## 2016-12-09 ENCOUNTER — Encounter (INDEPENDENT_AMBULATORY_CARE_PROVIDER_SITE_OTHER): Payer: Self-pay

## 2016-12-09 ENCOUNTER — Ambulatory Visit (INDEPENDENT_AMBULATORY_CARE_PROVIDER_SITE_OTHER): Payer: 59 | Admitting: Family Medicine

## 2016-12-09 ENCOUNTER — Encounter (INDEPENDENT_AMBULATORY_CARE_PROVIDER_SITE_OTHER): Payer: Self-pay | Admitting: Family Medicine

## 2016-12-09 NOTE — Telephone Encounter (Signed)
Please advise.   Thanks, Nivek Powley

## 2016-12-09 NOTE — Telephone Encounter (Signed)
Please contact pt.

## 2016-12-24 ENCOUNTER — Telehealth: Payer: Self-pay | Admitting: Family Medicine

## 2016-12-24 ENCOUNTER — Encounter: Payer: Self-pay | Admitting: Internal Medicine

## 2016-12-24 ENCOUNTER — Ambulatory Visit (INDEPENDENT_AMBULATORY_CARE_PROVIDER_SITE_OTHER): Payer: 59 | Admitting: Internal Medicine

## 2016-12-24 ENCOUNTER — Ambulatory Visit (INDEPENDENT_AMBULATORY_CARE_PROVIDER_SITE_OTHER): Payer: 59 | Admitting: Family Medicine

## 2016-12-24 VITALS — BP 102/70 | HR 63 | Ht 66.0 in | Wt 229.0 lb

## 2016-12-24 DIAGNOSIS — J9611 Chronic respiratory failure with hypoxia: Secondary | ICD-10-CM | POA: Diagnosis not present

## 2016-12-24 DIAGNOSIS — Z9989 Dependence on other enabling machines and devices: Secondary | ICD-10-CM | POA: Diagnosis not present

## 2016-12-24 DIAGNOSIS — G4733 Obstructive sleep apnea (adult) (pediatric): Secondary | ICD-10-CM

## 2016-12-24 LAB — PULMONARY FUNCTION TEST
DL/VA % pred: 82 %
DL/VA: 4.25 ml/min/mmHg/L
DLCO COR: 19.91 ml/min/mmHg
DLCO UNC: 20.15 ml/min/mmHg
DLCO cor % pred: 70 %
DLCO unc % pred: 71 %
FEF 25-75 POST: 3.4 L/s
FEF 25-75 PRE: 3.39 L/s
FEF2575-%Change-Post: 0 %
FEF2575-%PRED-PRE: 131 %
FEF2575-%Pred-Post: 131 %
FEV1-%Change-Post: 0 %
FEV1-%PRED-POST: 98 %
FEV1-%Pred-Pre: 98 %
FEV1-POST: 2.85 L
FEV1-Pre: 2.84 L
FEV1FVC-%Change-Post: 2 %
FEV1FVC-%Pred-Pre: 108 %
FEV6-%CHANGE-POST: -2 %
FEV6-%PRED-POST: 91 %
FEV6-%PRED-PRE: 93 %
FEV6-PRE: 3.37 L
FEV6-Post: 3.29 L
FEV6FVC-%PRED-POST: 103 %
FEV6FVC-%PRED-PRE: 103 %
FVC-%Change-Post: -2 %
FVC-%PRED-POST: 88 %
FVC-%Pred-Pre: 90 %
FVC-Post: 3.29 L
FVC-Pre: 3.37 L
PRE FEV1/FVC RATIO: 84 %
Post FEV1/FVC ratio: 87 %
Post FEV6/FVC ratio: 100 %
Pre FEV6/FVC Ratio: 100 %
RV % pred: 67 %
RV: 1.42 L
TLC % PRED: 88 %
TLC: 4.84 L

## 2016-12-24 NOTE — Patient Instructions (Addendum)
Unless you  Are develop morning symptoms - headache, feeling unrested or daytime hypersomnolence then no need to change your setting including the 02 pending review of your download .  Weight control is simply a matter of calorie balance which needs to be tilted in your favor by eating less and exercising more.  To get the most out of exercise, you need to be continuously aware that you are short of breath, but never out of breath, for 30 minutes daily. As you improve, it will actually be easier for you to do the same amount of exercise  in  30 minutes so always push to the level where you are short of breath.    Think of your calorie balance like you do your bank account where in this case you want the balance to go down so you must take in less calories than you burn up.  It's just that simple:  Hard to do, but easy to understand.  Good luck!   Pulmonary follow up is as needed

## 2016-12-24 NOTE — Progress Notes (Signed)
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 Most events were hypopneas then cpap titration per Alexandra Walker and this eliminated the desat at setting of cpap 8 max and wearing it ever since but ono was done on cpap 8 and showed desaturation  11/13/16 so referred to pulmonary clinic 12/24/2016 by Dr   Dr Alexandra Walker.   12/24/2016 1st [facility] office visit/ Alexandra Walker   - Epworth score 4  Chief Complaint  Patient presents with  . Pulmonary Consult    Referred by Dr. Riki Walker for eval of nocturnal hypoxia.   wakes up feeling good most am's s headache, feels resting well on cpap 8  Up to 70 min at treadmill at 24mph and starts at 15 degrees and gradually lowers  down s limiting sob      No obvious day to day or daytime variability or assoc excess/ purulent sputum or mucus plugs or hemoptysis or cp or chest tightness, subjective wheeze or overt   hb symptoms. No unusual exp hx or h/o childhood pna/ asthma or knowledge of premature birth.  Sleeping ok without nocturnal  or early am exacerbation  of respiratory  c/o's or need for noct saba. Also denies any obvious fluctuation of symptoms with weather or environmental changes or other aggravating or alleviating factors except as outlined above   Current Medications, Allergies, Complete Past Medical History, Past Surgical History, Family History, and Social History  were reviewed in [employer] record.  ROS  The following are not active complaints unless bolded sore throat, dysphagia, dental problems, itching, sneezing,  nasal congestion or excess/ purulent secretions, ear ache,   fever, chills, sweats, unintended wt loss, classically pleuritic or exertional cp,  orthopnea pnd or leg swelling, presyncope, palpitations, abdominal pain, anorexia, nausea, vomiting, diarrhea  or change in bowel or bladder habits, change in stools or urine, dysuria,hematuria,  rash, arthralgias, visual complaints, headache, numbness, weakness or ataxia or problems with walking or coordination,  change in mood/affect or memory.             Review of Systems     Objective:   Physical Exam  Amb wf nad  Wt Readings from Last 3 Encounters:  12/24/16 229 lb (103.9 kg)  11/26/16 230 lb (104.3 kg)  11/14/16 227 lb (103 kg)    Vital signs reviewed - - Note on arrival 02 sats  96% on RA   HEENT: nl dentition, turbinates bilaterally, and oropharynx. Nl external ear canals without cough reflex Modified Mallampati Score =   1    NECK :  without JVD/Nodes/TM/ nl carotid upstrokes bilaterally   LUNGS: no acc muscle use,  Nl contour chest which is clear to A and P bilaterally without cough on insp or exp maneuvers   CV:  RRR  no s3 or murmur  or increase in P2, and no edema   ABD:  soft and nontender with nl inspiratory excursion in the supine position. No bruits or organomegaly appreciated, bowel sounds nl  MS:  Nl gait/ ext warm without deformities, calf tenderness, cyanosis or clubbing No obvious joint restrictions   SKIN: warm and dry without lesions    NEURO:  alert, approp, nl sensorium with  no motor or cerebellar deficits apparent.    I personally reviewed images and agree with radiology impression as follows:   CTa chest/ coronary study 02/12/16 Lungs wnl          Assessment:

## 2016-12-24 NOTE — Telephone Encounter (Signed)
° °  Reason for call:  Tanda Rockers, MD / [facility] requesting ONO report please fax

## 2016-12-25 NOTE — Assessment & Plan Note (Signed)
05/01/16 PSS  AHI = 21 with desats which resolved on 8 cpap at titration study by [place] on RA 11/13/16 x 22 min on cpap 8  - PFT's  12/24/2016   Nl flows  p nothing  prior to study with DLCO  71/70 % corrects to 82 % for alv volume with erv 47%  - Download requested 12/24/2016  With Epworth = 4    Not clear why the cpap titration corrected the desats but the same amt of cpap at home does not do as well as she has been losing wt voluntarily since the original titration.   Will first check the download to be sure the cpap is working correctly and if not refer back to sleep medicine  However, in the absence of more profound noct hypoxemia for longer duration or the presence of any symptoms suggesting noct hypoxemia (am ha or feeling unrested or foggy headed) or signs of this (polycythemia, poorly controlled hbp or leg swelling) then I would just periodically (maybe sev times a year) repeat the ono on cpap to be sure 02 stas  not trending down more and in meantime work hard on wt loss

## 2016-12-25 NOTE — Assessment & Plan Note (Signed)
F/u dohlmeier planned - download requested 12/24/2016

## 2016-12-25 NOTE — Assessment & Plan Note (Signed)
Body mass index is 36.96 kg/m.  -  trending down slightly/ encouraged  Lab Results  Component Value Date   TSH 2.190 10/28/2016     Contributing to gerd risk/ doe/reviewed the need and the process to achieve and maintain neg calorie balance > defer f/u primary care including intermittently monitoring thyroid status

## 2016-12-28 ENCOUNTER — Encounter (INDEPENDENT_AMBULATORY_CARE_PROVIDER_SITE_OTHER): Payer: Self-pay | Admitting: Family Medicine

## 2016-12-30 ENCOUNTER — Ambulatory Visit (INDEPENDENT_AMBULATORY_CARE_PROVIDER_SITE_OTHER): Payer: 59 | Admitting: Family Medicine

## 2016-12-30 VITALS — BP 143/79 | HR 61 | Temp 98.7°F | Ht 66.0 in | Wt 222.0 lb

## 2016-12-30 DIAGNOSIS — E669 Obesity, unspecified: Secondary | ICD-10-CM

## 2016-12-30 DIAGNOSIS — I10 Essential (primary) hypertension: Secondary | ICD-10-CM | POA: Diagnosis not present

## 2016-12-30 DIAGNOSIS — Z6835 Body mass index (BMI) 35.0-35.9, adult: Secondary | ICD-10-CM | POA: Diagnosis not present

## 2016-12-30 DIAGNOSIS — IMO0001 Reserved for inherently not codable concepts without codable children: Secondary | ICD-10-CM

## 2016-12-30 NOTE — Progress Notes (Signed)
Office: [phone]  /  Fax: [phone]   HPI:   Chief Complaint: OBESITY Alexandra Walker is here to discuss her progress with her obesity treatment plan. She is on the Category 2 plan and is following her eating plan approximately 90 % of the time. She states she is exercising 0 minutes 0 times per week. Alexandra Walker is working on [employer] protein and decreasing simple carbohydrates and bread and is not really following her category 2 plan. She misses eating out and has increased stress with her daughter going to college. She has questions about a new vegetarian restaurant opening by her work to see if it has any good options. Her weight is 222 lb (100.7 kg) today and has had a weight loss of 8 pounds over a period of 5 weeks since her last visit. She has lost 12 lbs since starting treatment with [name].  Hypertension ROYAL BEIRNE is a 61 y.o. female with hypertension. Her blood pressure is elevated today on metoprolol. Her blood pressure is normally well controlled but she is somewhat upset today which may be contributing to this. Alexandra Walker denies chest pain, headache or shortness of breath on exertion. She is working weight loss to help control her blood pressure with the goal of decreasing her risk of heart attack and stroke. [name] blood pressure is not currently controlled.   ALLERGIES: Allergies  Allergen Reactions   Ace Inhibitors Other (See Comments)    coughing   Oxycodone-Acetaminophen Nausea And Vomiting   Percocet [Oxycodone-Acetaminophen] Nausea And Vomiting   Vicodin [Hydrocodone-Acetaminophen] Nausea And Vomiting   Victoza [Liraglutide] Other (See Comments)    pancreatitis    MEDICATIONS: Current Outpatient Prescriptions on File Prior to Visit  Medication Sig Dispense Refill   aspirin 325 MG tablet Take 325 mg by mouth daily.     Blood Glucose Monitoring Suppl (FREESTYLE LITE) DEVI Check blood sugar 2-3 times per day.  Dx:E11.21 1 each 0   cholecalciferol  (VITAMIN D) 1000 UNITS tablet Take 1,000 Units by mouth daily.     cyclobenzaprine (FLEXERIL) 5 MG tablet Take 1 tablet (5 mg total) by mouth at bedtime as needed for muscle spasms. 30 tablet 2   glucose blood (FREESTYLE LITE) test strip Check blood sugar 2-3 times per day.  Dx:E11.21. 100 each 12   hydrochlorothiazide (HYDRODIURIL) 25 MG tablet Take 1 tablet (25 mg total) by mouth daily. Once daily 30 tablet 5   ibuprofen (ADVIL,MOTRIN) 200 MG tablet Take 200 mg by mouth every 6 (six) hours as needed.     Lancets (FREESTYLE) lancets Check blood sugar 2-3 times per day.  Dx:E11.21 100 each 12   losartan (COZAAR) 50 MG tablet Take 1 tablet (50 mg total) by mouth daily. 30 tablet 5   metFORMIN (GLUCOPHAGE) 500 MG tablet Take 1,000 mg by mouth 2 (two) times daily with a meal.     metoprolol succinate (TOPROL-XL) 50 MG 24 hr tablet Take 1 tablet (50 mg total) by mouth daily. Take with or immediately following a meal. 30 tablet 5   Omega-3 Fatty Acids (FISH OIL PO) Take 1 tablet by mouth daily.     saccharomyces boulardii (FLORASTOR) 250 MG capsule Take 1 capsule (250 mg total) by mouth 2 (two) times daily. 60 capsule 0   simvastatin (ZOCOR) 40 MG tablet Take 1 tablet (40 mg total) by mouth every evening. 30 tablet 5   Turmeric 450 MG CAPS Take 900 mg by mouth daily.     No current  facility-administered medications on file prior to visit.     PAST MEDICAL HISTORY: Past Medical History:  Diagnosis Date   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HCC)    Arthritis    Back pain    Chronic depressive disorder    Chronic edema    bilateral ankles   Circadian rhythm sleep disorder    Diabetes (HCC)    Fatty liver    Gallbladder problem    Ganglion of knee    left   GERD (gastroesophageal reflux disease)    rare   Glucose intolerance (impaired glucose tolerance)    Hepatic steatosis    HLA B27 (HLA B27 positive)    Hx of pancreatitis    Hyperlipidemia    Hypertension     Hypokalemia    Hypomagnesemia    IBS (irritable bowel syndrome)    IBS (irritable bowel syndrome)    Ischemic colitis (HCC)    Joint pain    Kyphosis    Lateral epicondylitis of right elbow    Leg edema    Lymphadenopathy    Migraine headache    history of   Morbid obesity ([place])    Onycholysis    Osteoarthritis    PAC (premature atrial contraction)    PAC, PJC, managed well with BB   Paronychia    Paroxysmal atrial fibrillation ([place])    a. 01/2016 - brief episode during adm for severe sepsis.   PMS (premenstrual syndrome)    PONV (postoperative nausea and vomiting)    Prediabetes    Pyelonephritis 01/2016   Sacroiliitis ([place])    Seasonal allergies    Sepsis ([place]) 01/2016   Sleep hypopnea    SVT (supraventricular tachycardia) ([place])    a. 01/2016 - SVT, wide complex tachycardia, and brief afib in setting of severe sepsis.   Trigger ring finger of left hand    Vitamin D deficiency    Vitamin D deficiency     PAST SURGICAL HISTORY: Past Surgical History:  Procedure Laterality Date   abdominal laparascopic  92   BLADDER SURGERY     BREAST SURGERY  2003   bx lt-neg   CERVICAL CONE BIOPSY     CHOLECYSTECTOMY N/A 10/26/2014   Procedure: LAPAROSCOPIC CHOLECYSTECTOMY;  Surgeon: Coralie Keens, MD;  Location: [facility];  Service: General;  Laterality: N/A;   COLONOSCOPY W/ BIOPSIES  multiple   DIAGNOSTIC LAPAROSCOPY     DILATION AND CURETTAGE OF UTERUS  07,11   ESOPHAGOGASTRODUODENOSCOPY  multiple   KNEE ARTHROSCOPY  07,10   left   LAPAROSCOPIC VAGINAL HYSTERECTOMY WITH SALPINGO OOPHORECTOMY Bilateral 10/03/2015   Procedure: LAPAROSCOPIC ASSISTED VAGINAL HYSTERECTOMY WITH SALPINGECTOMY;  Surgeon: Eldred Manges, MD;  Location: [facility];  Service: Gynecology;  Laterality: Bilateral;   left finger surgery     MASS EXCISION Left 12/24/2012   Procedure: LEFT EXCISION OF PALMAR MASS X TWO;  Surgeon: Roseanne Kaufman, MD;   Location: [facility];  Service: Orthopedics;  Laterality: Left;    SOCIAL HISTORY: Social History  Substance Use Topics   Smoking status: Never Smoker   Smokeless tobacco: Never Used   Alcohol use 0.0 oz/week     Comment: social    FAMILY HISTORY: Family History  Problem Relation Age of Onset   Heart attack Father    Heart disease Father    Coronary artery disease Mother    Hypertension Mother    Heart disease Mother    Heart attack Mother    Diabetes Mother  Hyperlipidemia Mother    Kidney disease Mother    Depression Mother    Sleep apnea Mother    Obesity Mother    Hypertension Sister    Hypertension Brother    Colon cancer Neg Hx     ROS: Review of Systems  Constitutional: Positive for weight loss.  Respiratory: Negative for shortness of breath (on exertion).   Cardiovascular: Negative for chest pain.  Neurological: Negative for headaches.    PHYSICAL EXAM: Blood pressure (!) 143/79, pulse 61, temperature 98.7 F (37.1 C), temperature source Oral, height '5\' 6"'  (1.676 m), weight 222 lb (100.7 kg), last menstrual period 05/20/2008, SpO2 100 %. Body mass index is 35.83 kg/m. Physical Exam  Constitutional: She is oriented to person, place, and time. She appears well-developed and well-nourished.  Cardiovascular: Normal rate.   Pulmonary/Chest: Effort normal.  Musculoskeletal: Normal range of motion.  Neurological: She is oriented to person, place, and time.  Skin: Skin is warm and dry.  Psychiatric: She has a normal mood and affect. Her behavior is normal.  Vitals reviewed.   RECENT LABS AND TESTS: BMET    Component Value Date/Time   NA 141 10/28/2016 1031   K 4.5 10/28/2016 1031   CL 99 10/28/2016 1031   CO2 27 10/28/2016 1031   GLUCOSE 112 (H) 10/28/2016 1031   GLUCOSE 112 (H) 02/02/2016 0357   BUN 13 10/28/2016 1031   CREATININE 0.66 10/28/2016 1031   CREATININE 0.69 01/11/2016 1537   CALCIUM 9.8 10/28/2016  1031   GFRNONAA 98 10/28/2016 1031   GFRAA 113 10/28/2016 1031   Lab Results  Component Value Date   HGBA1C 5.6 10/28/2016   Lab Results  Component Value Date   INSULIN 10.0 10/28/2016   CBC    Component Value Date/Time   WBC 6.6 11/06/2016 1623   RBC 4.99 11/06/2016 1623   HGB 13.7 11/06/2016 1623   HGB 13.7 10/28/2016 1031   HCT 41.9 11/06/2016 1623   HCT 42.6 10/28/2016 1031   PLT 179 11/06/2016 1623   MCV 84.0 11/06/2016 1623   MCV 84 10/28/2016 1031   MCH 27.5 11/06/2016 1623   MCHC 32.7 11/06/2016 1623   RDW 13.2 11/06/2016 1623   RDW 13.0 10/28/2016 1031   LYMPHSABS 594 (L) 11/06/2016 1623   LYMPHSABS 1.2 10/28/2016 1031   MONOABS 330 11/06/2016 1623   EOSABS 0 (L) 11/06/2016 1623   EOSABS 0.2 10/28/2016 1031   BASOSABS 0 11/06/2016 1623   BASOSABS 0.0 10/28/2016 1031   Iron/TIBC/Ferritin/ %Sat No results found for: IRON, TIBC, FERRITIN, IRONPCTSAT Lipid Panel     Component Value Date/Time   CHOL 173 10/28/2016 1031   TRIG 144 10/28/2016 1031   HDL 43 10/28/2016 1031   LDLCALC 101 (H) 10/28/2016 1031   Hepatic Function Panel     Component Value Date/Time   PROT 6.8 10/28/2016 1031   ALBUMIN 4.3 10/28/2016 1031   AST 11 10/28/2016 1031   ALT 14 10/28/2016 1031   ALKPHOS 78 10/28/2016 1031   BILITOT 0.5 10/28/2016 1031      Component Value Date/Time   TSH 2.190 10/28/2016 1031    ASSESSMENT AND PLAN: Essential hypertension  Class 2 obesity with serious comorbidity and body mass index (BMI) of 35.0 to 35.9 in adult, unspecified obesity type  PLAN:  Hypertension We discussed sodium restriction, working on healthy weight loss, and a regular exercise program as the means to achieve improved blood pressure control. Kerianna agreed with this plan and agreed to  follow up as directed. We will re-check blood pressure in 4 weeks and will continue to monitor her blood pressure as well as her progress with the above lifestyle modifications. She agrees to  continue metoporlol and will watch for signs of hypotension as she continues her lifestyle modifications.  We spent > than 50% of the 15 minute visit on the counseling as documented in the note.   Obesity Gladyse is currently in the action stage of change. As such, her goal is to continue with weight loss efforts She has agreed to portion control better and make smarter food choices, such as increase vegetables and decrease simple carbohydrates  Hania has been instructed to work up to a goal of 150 minutes of combined cardio and strengthening exercise per week for weight loss and overall health benefits. We discussed the following Behavioral Modification Strategies today: increase H2O intake, meal planning & cooking strategies, increasing lean protein intake and decreasing simple carbohydrates   Will review the menu at the new vegetarian restaurant but Elspeth is okay to go 1 time per week and make good choices.  Kabrea has agreed to follow up with our clinic in 4 weeks. She was informed of the importance of frequent follow up visits to maximize her success with intensive lifestyle modifications for her multiple health conditions.  I, Doreene Nest, am acting as transcriptionist for Dennard Nip, MD  I have reviewed the above documentation for accuracy and completeness, and I agree with the above. -Dennard Nip, MD   OBESITY BEHAVIORAL INTERVENTION VISIT  Today's visit was # 4 out of 22.  Starting weight: 234 lbs Starting date: 10/28/16 Today's weight : 222 lbs  Today's date: 12/30/2016 Total lbs lost to date: 12 (Patients must lose 7 lbs in the first 6 months to continue with counseling)   ASK: We discussed the diagnosis of obesity with Alexandra Walker today and Lelan Pons agreed to give [name] permission to discuss obesity behavioral modification therapy today.  ASSESS: Yula has the diagnosis of obesity and her BMI today is 35.9 Jacara is in the action stage of change   ADVISE: Novis was  educated on the multiple health risks of obesity as well as the benefit of weight loss to improve her health. She was advised of the need for long term treatment and the importance of lifestyle modifications.  AGREE: Multiple dietary modification options and treatment options were discussed and  Luetta agreed to portion control better and make smarter food choices, such as increase vegetables and decrease simple carbohydrates  We discussed the following Behavioral Modification Strategies today: increase H2O intake, meal planning & cooking strategies, increasing lean protein intake and decreasing simple carbohydrates

## 2017-01-01 ENCOUNTER — Telehealth: Payer: Self-pay | Admitting: Internal Medicine

## 2017-01-01 NOTE — Telephone Encounter (Signed)
Dr Melvyn Novas were you needing something regarding an ONO on this patient??  I spoke with Angie at [facility] and they stated they received a phone call from our office requesting an ONO order but there is nothing documented in EPIC. Angie stated that they can see what we see in EPIC.  Nothing mentioned within last OV notes.

## 2017-01-01 NOTE — Telephone Encounter (Signed)
Received call from Astyn with [facility] regarding the message below.  She stated that she was not sure what was needed for the message.  She will speak with Dr. Melvyn Novas to see if any was needed.//AB/CMA

## 2017-01-01 NOTE — Telephone Encounter (Signed)
She will need ono on cpap in 3 months but we can arrange that at f/u ov

## 2017-01-09 DIAGNOSIS — M7712 Lateral epicondylitis, left elbow: Secondary | ICD-10-CM | POA: Diagnosis not present

## 2017-01-13 ENCOUNTER — Encounter: Payer: Self-pay | Admitting: Internal Medicine

## 2017-01-13 NOTE — Telephone Encounter (Signed)
MW please advise on PFT results. Thanks.

## 2017-01-21 MED FILL — HYDROCHLOROTHIAZIDE 25 MG T: 25 | 90 days supply | Qty: 90 | Fill #1

## 2017-01-28 ENCOUNTER — Ambulatory Visit (INDEPENDENT_AMBULATORY_CARE_PROVIDER_SITE_OTHER): Payer: 59 | Admitting: Family Medicine

## 2017-01-28 VITALS — BP 110/69 | HR 56 | Temp 98.3°F | Ht 66.0 in | Wt 219.0 lb

## 2017-01-28 DIAGNOSIS — I10 Essential (primary) hypertension: Secondary | ICD-10-CM | POA: Diagnosis not present

## 2017-01-28 DIAGNOSIS — Z6835 Body mass index (BMI) 35.0-35.9, adult: Secondary | ICD-10-CM | POA: Diagnosis not present

## 2017-01-28 DIAGNOSIS — E669 Obesity, unspecified: Secondary | ICD-10-CM | POA: Diagnosis not present

## 2017-01-28 DIAGNOSIS — IMO0001 Reserved for inherently not codable concepts without codable children: Secondary | ICD-10-CM

## 2017-01-28 NOTE — Progress Notes (Signed)
Office: [phone]  /  Fax: [phone]   HPI:   Chief Complaint: OBESITY Alexandra Walker is here to discuss her progress with her obesity treatment plan. She is on the Category 2 plan and is following her eating plan approximately 80 % of the time. She states she is exercising 0 minutes 0 times per week. Alexandra Walker continues to do well with weight loss. She is working multiple shifts and is not sleeping as well. She still struggles with planning meals ahead of time and often snacks at work to stay awake. Her weight is 219 lb (99.3 kg) today and has had a weight loss of 3 pounds over a period of 4 weeks since her last visit. She has lost 15 lbs since starting treatment with [name].  Hypertension Alexandra Walker is a 61 y.o. female with hypertension. Her blood pressure is well controlled and she has a strong family history or chronic renal failure and she would like to know is she still needs HCTZ. Alexandra Walker denies chest pain or shortness of breath on exertion. She is working weight loss to help control her blood pressure with the goal of decreasing her risk of heart attack and stroke. Alexandra Walker blood pressure is currently controlled.   ALLERGIES: Allergies  Allergen Reactions   Ace Inhibitors Other (See Comments)    coughing   Oxycodone-Acetaminophen Nausea And Vomiting   Percocet [Oxycodone-Acetaminophen] Nausea And Vomiting   Vicodin [Hydrocodone-Acetaminophen] Nausea And Vomiting   Victoza [Liraglutide] Other (See Comments)    pancreatitis    MEDICATIONS: Current Outpatient Prescriptions on File Prior to Visit  Medication Sig Dispense Refill   aspirin 325 MG tablet Take 325 mg by mouth daily.     Blood Glucose Monitoring Suppl (FREESTYLE LITE) DEVI Check blood sugar 2-3 times per day.  Dx:E11.21 1 each 0   cholecalciferol (VITAMIN D) 1000 UNITS tablet Take 1,000 Units by mouth daily.     cyclobenzaprine (FLEXERIL) 5 MG tablet Take 1 tablet (5 mg total) by mouth at bedtime as  needed for muscle spasms. 30 tablet 2   glucose blood (FREESTYLE LITE) test strip Check blood sugar 2-3 times per day.  Dx:E11.21. 100 each 12   ibuprofen (ADVIL,MOTRIN) 200 MG tablet Take 200 mg by mouth every 6 (six) hours as needed.     Lancets (FREESTYLE) lancets Check blood sugar 2-3 times per day.  Dx:E11.21 100 each 12   losartan (COZAAR) 50 MG tablet Take 1 tablet (50 mg total) by mouth daily. 30 tablet 5   metFORMIN (GLUCOPHAGE) 500 MG tablet Take 1,000 mg by mouth 2 (two) times daily with a meal.     metoprolol succinate (TOPROL-XL) 50 MG 24 hr tablet Take 1 tablet (50 mg total) by mouth daily. Take with or immediately following a meal. 30 tablet 5   Omega-3 Fatty Acids (FISH OIL PO) Take 1 tablet by mouth daily.     saccharomyces boulardii (FLORASTOR) 250 MG capsule Take 1 capsule (250 mg total) by mouth 2 (two) times daily. 60 capsule 0   simvastatin (ZOCOR) 40 MG tablet Take 1 tablet (40 mg total) by mouth every evening. 30 tablet 5   Turmeric 450 MG CAPS Take 900 mg by mouth daily.     No current facility-administered medications on file prior to visit.     PAST MEDICAL HISTORY: Past Medical History:  Diagnosis Date   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HCC)    Arthritis    Back pain    Chronic depressive disorder  Chronic edema    bilateral ankles   Circadian rhythm sleep disorder    Diabetes ([place])    Fatty liver    Gallbladder problem    Ganglion of knee    left   GERD (gastroesophageal reflux disease)    rare   Glucose intolerance (impaired glucose tolerance)    Hepatic steatosis    HLA B27 (HLA B27 positive)    Hx of pancreatitis    Hyperlipidemia    Hypertension    Hypokalemia    Hypomagnesemia    IBS (irritable bowel syndrome)    IBS (irritable bowel syndrome)    Ischemic colitis (HCC)    Joint pain    Kyphosis    Lateral epicondylitis of right elbow    Leg edema    Lymphadenopathy    Migraine headache    history of     Morbid obesity ([place])    Onycholysis    Osteoarthritis    PAC (premature atrial contraction)    PAC, PJC, managed well with BB   Paronychia    Paroxysmal atrial fibrillation ([place])    a. 01/2016 - brief episode during adm for severe sepsis.   PMS (premenstrual syndrome)    PONV (postoperative nausea and vomiting)    Prediabetes    Pyelonephritis 01/2016   Sacroiliitis ([place])    Seasonal allergies    Sepsis ([place]) 01/2016   Sleep hypopnea    SVT (supraventricular tachycardia) ([place])    a. 01/2016 - SVT, wide complex tachycardia, and brief afib in setting of severe sepsis.   Trigger ring finger of left hand    Vitamin D deficiency    Vitamin D deficiency     PAST SURGICAL HISTORY: Past Surgical History:  Procedure Laterality Date   abdominal laparascopic  92   BLADDER SURGERY     BREAST SURGERY  2003   bx lt-neg   CERVICAL CONE BIOPSY     CHOLECYSTECTOMY N/A 10/26/2014   Procedure: LAPAROSCOPIC CHOLECYSTECTOMY;  Surgeon: Coralie Keens, MD;  Location: [facility];  Service: General;  Laterality: N/A;   COLONOSCOPY W/ BIOPSIES  multiple   DIAGNOSTIC LAPAROSCOPY     DILATION AND CURETTAGE OF UTERUS  07,11   ESOPHAGOGASTRODUODENOSCOPY  multiple   KNEE ARTHROSCOPY  07,10   left   LAPAROSCOPIC VAGINAL HYSTERECTOMY WITH SALPINGO OOPHORECTOMY Bilateral 10/03/2015   Procedure: LAPAROSCOPIC ASSISTED VAGINAL HYSTERECTOMY WITH SALPINGECTOMY;  Surgeon: Eldred Manges, MD;  Location: [facility];  Service: Gynecology;  Laterality: Bilateral;   left finger surgery     MASS EXCISION Left 12/24/2012   Procedure: LEFT EXCISION OF PALMAR MASS X TWO;  Surgeon: Roseanne Kaufman, MD;  Location: [facility];  Service: Orthopedics;  Laterality: Left;    SOCIAL HISTORY: Social History  Substance Use Topics   Smoking status: Never Smoker   Smokeless tobacco: Never Used   Alcohol use 0.0 oz/week     Comment: social    FAMILY  HISTORY: Family History  Problem Relation Age of Onset   Heart attack Father    Heart disease Father    Coronary artery disease Mother    Hypertension Mother    Heart disease Mother    Heart attack Mother    Diabetes Mother    Hyperlipidemia Mother    Kidney disease Mother    Depression Mother    Sleep apnea Mother    Obesity Mother    Hypertension Sister    Hypertension Brother    Colon cancer Neg Hx  ROS: Review of Systems  Constitutional: Positive for weight loss.  Respiratory: Negative for shortness of breath (on exertion).   Cardiovascular: Negative for chest pain.    PHYSICAL EXAM: Blood pressure 110/69, pulse (!) 56, temperature 98.3 F (36.8 C), temperature source Oral, height '5\' 6"'  (1.676 m), weight 219 lb (99.3 kg), last menstrual period 05/20/2008, SpO2 100 %. Body mass index is 35.35 kg/m. Physical Exam  Constitutional: She is oriented to person, place, and time. She appears well-developed and well-nourished.  Cardiovascular: Normal rate.   Pulmonary/Chest: Effort normal.  Musculoskeletal: Normal range of motion.  Neurological: She is oriented to person, place, and time.  Skin: Skin is warm and dry.  Psychiatric: She has a normal mood and affect. Her behavior is normal.  Vitals reviewed.   RECENT LABS AND TESTS: BMET    Component Value Date/Time   NA 141 10/28/2016 1031   K 4.5 10/28/2016 1031   CL 99 10/28/2016 1031   CO2 27 10/28/2016 1031   GLUCOSE 112 (H) 10/28/2016 1031   GLUCOSE 112 (H) 02/02/2016 0357   BUN 13 10/28/2016 1031   CREATININE 0.66 10/28/2016 1031   CREATININE 0.69 01/11/2016 1537   CALCIUM 9.8 10/28/2016 1031   GFRNONAA 98 10/28/2016 1031   GFRAA 113 10/28/2016 1031   Lab Results  Component Value Date   HGBA1C 5.6 10/28/2016   Lab Results  Component Value Date   INSULIN 10.0 10/28/2016   CBC    Component Value Date/Time   WBC 6.6 11/06/2016 1623   RBC 4.99 11/06/2016 1623   HGB 13.7 11/06/2016  1623   HGB 13.7 10/28/2016 1031   HCT 41.9 11/06/2016 1623   HCT 42.6 10/28/2016 1031   PLT 179 11/06/2016 1623   MCV 84.0 11/06/2016 1623   MCV 84 10/28/2016 1031   MCH 27.5 11/06/2016 1623   MCHC 32.7 11/06/2016 1623   RDW 13.2 11/06/2016 1623   RDW 13.0 10/28/2016 1031   LYMPHSABS 594 (L) 11/06/2016 1623   LYMPHSABS 1.2 10/28/2016 1031   MONOABS 330 11/06/2016 1623   EOSABS 0 (L) 11/06/2016 1623   EOSABS 0.2 10/28/2016 1031   BASOSABS 0 11/06/2016 1623   BASOSABS 0.0 10/28/2016 1031   Iron/TIBC/Ferritin/ %Sat No results found for: IRON, TIBC, FERRITIN, IRONPCTSAT Lipid Panel     Component Value Date/Time   CHOL 173 10/28/2016 1031   TRIG 144 10/28/2016 1031   HDL 43 10/28/2016 1031   LDLCALC 101 (H) 10/28/2016 1031   Hepatic Function Panel     Component Value Date/Time   PROT 6.8 10/28/2016 1031   ALBUMIN 4.3 10/28/2016 1031   AST 11 10/28/2016 1031   ALT 14 10/28/2016 1031   ALKPHOS 78 10/28/2016 1031   BILITOT 0.5 10/28/2016 1031      Component Value Date/Time   TSH 2.190 10/28/2016 1031    ASSESSMENT AND PLAN: Essential hypertension  Class 2 obesity with serious comorbidity and body mass index (BMI) of 35.0 to 35.9 in adult, unspecified obesity type  PLAN:  Hypertension We discussed sodium restriction, working on healthy weight loss, and a regular exercise program as the means to achieve improved blood pressure control. Alexandra Walker agreed with this plan and agreed to follow up as directed. We will re-check her blood pressure in 4 weeks and will continue to monitor her blood pressure as well as her progress with the above lifestyle modifications. She agrees to discontinue HCTZ and will watch for signs of hypotension as she continues her lifestyle modifications. Alexandra Walker  agrees to follow up with our clinic in 4 weeks.  Obesity Alexandra Walker is currently in the action stage of change. As such, her goal is to continue with weight loss efforts She has agreed to follow the  Category 2 plan Alexandra Walker has been instructed to work up to a goal of 150 minutes of combined cardio and strengthening exercise per week for weight loss and overall health benefits. We discussed the following Behavioral Modification Strategies today: increase H2O intake, increasing lean protein intake and decreasing sodium intake  Alexandra Walker has agreed to follow up with our clinic in 4 weeks. She was informed of the importance of frequent follow up visits to maximize her success with intensive lifestyle modifications for her multiple health conditions.  I, Doreene Nest, am acting as transcriptionist for Dennard Nip, MD  I have reviewed the above documentation for accuracy and completeness, and I agree with the above. -Dennard Nip, MD   OBESITY BEHAVIORAL INTERVENTION VISIT  Today's visit was # 5 out of 22.  Starting weight: 234 lbs Starting date: 10/28/16 Today's weight : 219 lbs  Today's date: 01/28/2017 Total lbs lost to date: 15 (Patients must lose 7 lbs in the first 6 months to continue with counseling)   ASK: We discussed the diagnosis of obesity with Alexandra Walker today and Alexandra Walker agreed to give [name] permission to discuss obesity behavioral modification therapy today.  ASSESS: Alexandra Walker has the diagnosis of obesity and her BMI today is 35.36 Alexandra Walker is in the action stage of change   ADVISE: Alexandra Walker was educated on the multiple health risks of obesity as well as the benefit of weight loss to improve her health. She was advised of the need for long term treatment and the importance of lifestyle modifications.  AGREE: Multiple dietary modification options and treatment options were discussed and  Alexandra Walker agreed to follow the Category 2 plan We discussed the following Behavioral Modification Strategies today: increase H2O intake, increasing lean protein intake and decreasing sodium intake

## 2017-02-06 DIAGNOSIS — L700 Acne vulgaris: Secondary | ICD-10-CM | POA: Diagnosis not present

## 2017-02-06 DIAGNOSIS — D2239 Melanocytic nevi of other parts of face: Secondary | ICD-10-CM | POA: Diagnosis not present

## 2017-02-24 ENCOUNTER — Ambulatory Visit (INDEPENDENT_AMBULATORY_CARE_PROVIDER_SITE_OTHER): Payer: 59 | Admitting: Family Medicine

## 2017-02-24 VITALS — BP 122/78 | HR 60 | Temp 98.7°F | Ht 66.0 in | Wt 223.0 lb

## 2017-02-24 DIAGNOSIS — Z6836 Body mass index (BMI) 36.0-36.9, adult: Secondary | ICD-10-CM | POA: Diagnosis not present

## 2017-02-24 DIAGNOSIS — E119 Type 2 diabetes mellitus without complications: Secondary | ICD-10-CM

## 2017-02-24 NOTE — Progress Notes (Signed)
Office: [phone]  /  Fax: [phone]   HPI:   Chief Complaint: OBESITY Alexandra Walker is here to discuss her progress with her obesity treatment plan. She is on the Category 2 plan and is following her eating plan approximately 80 % of the time. She states she is exercising 0 minutes 0 times per week. Alexandra Walker has not been following the plan as closely while daughter was home from college. She is ready to get back on track but would like to journal now with the lose it app. Her weight is 223 lb (101.2 kg) today and has had a weight gain of 4 pounds over a period of 4 weeks since her last visit. She has lost 11 lbs since starting treatment with [name].  Diabetes II Alexandra Walker has a diagnosis of diabetes type II. She is currently on metformin and denies any hypoglycemic episodes, nausea or vomiting. She has been working on intensive lifestyle modifications including diet, exercise, and weight loss to help control her blood glucose levels.   ALLERGIES: Allergies  Allergen Reactions  . Ace Inhibitors Other (See Comments)    coughing  . Oxycodone-Acetaminophen Nausea And Vomiting  . Percocet [Oxycodone-Acetaminophen] Nausea And Vomiting  . Vicodin [Hydrocodone-Acetaminophen] Nausea And Vomiting  . Victoza [Liraglutide] Other (See Comments)    pancreatitis    MEDICATIONS: Current Outpatient Prescriptions on File Prior to Visit  Medication Sig Dispense Refill  . aspirin 325 MG tablet Take 325 mg by mouth daily.    . Blood Glucose Monitoring Suppl (FREESTYLE LITE) DEVI Check blood sugar 2-3 times per day.  Dx:E11.21 1 each 0  . cholecalciferol (VITAMIN D) 1000 UNITS tablet Take 1,000 Units by mouth daily.    . cyclobenzaprine (FLEXERIL) 5 MG tablet Take 1 tablet (5 mg total) by mouth at bedtime as needed for muscle spasms. 30 tablet 2  . glucose blood (FREESTYLE LITE) test strip Check blood sugar 2-3 times per day.  Dx:E11.21. 100 each 12  . ibuprofen (ADVIL,MOTRIN) 200 MG tablet Take 200 mg by mouth  every 6 (six) hours as needed.    . Lancets (FREESTYLE) lancets Check blood sugar 2-3 times per day.  Dx:E11.21 100 each 12  . losartan (COZAAR) 50 MG tablet Take 1 tablet (50 mg total) by mouth daily. 30 tablet 5  . metFORMIN (GLUCOPHAGE) 500 MG tablet Take 1,000 mg by mouth 2 (two) times daily with a meal.    . metoprolol succinate (TOPROL-XL) 50 MG 24 hr tablet Take 1 tablet (50 mg total) by mouth daily. Take with or immediately following a meal. 30 tablet 5  . Omega-3 Fatty Acids (FISH OIL PO) Take 1 tablet by mouth daily.    Marland Kitchen saccharomyces boulardii (FLORASTOR) 250 MG capsule Take 1 capsule (250 mg total) by mouth 2 (two) times daily. 60 capsule 0  . simvastatin (ZOCOR) 40 MG tablet Take 1 tablet (40 mg total) by mouth every evening. 30 tablet 5  . Turmeric 450 MG CAPS Take 900 mg by mouth daily.     No current facility-administered medications on file prior to visit.     PAST MEDICAL HISTORY: Past Medical History:  Diagnosis Date  . 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[place])   . Arthritis   . Back pain   . Chronic depressive disorder   . Chronic edema    bilateral ankles  . Circadian rhythm sleep disorder   . Diabetes ([place])   . Fatty liver   . Gallbladder problem   . Ganglion of knee  left  . GERD (gastroesophageal reflux disease)    rare  . Glucose intolerance (impaired glucose tolerance)   . Hepatic steatosis   . HLA B27 (HLA B27 positive)   . Hx of pancreatitis   . Hyperlipidemia   . Hypertension   . Hypokalemia   . Hypomagnesemia   . IBS (irritable bowel syndrome)   . IBS (irritable bowel syndrome)   . Ischemic colitis ([place])   . Joint pain   . Kyphosis   . Lateral epicondylitis of right elbow   . Leg edema   . Lymphadenopathy   . Migraine headache    history of  . Morbid obesity ([place])   . Onycholysis   . Osteoarthritis   . PAC (premature atrial contraction)    PAC, PJC, managed well with BB  . Paronychia   . Paroxysmal atrial fibrillation ([place])    a. 01/2016 -  brief episode during adm for severe sepsis.  Marland Kitchen PMS (premenstrual syndrome)   . PONV (postoperative nausea and vomiting)   . Prediabetes   . Pyelonephritis 01/2016  . Sacroiliitis ([place])   . Seasonal allergies   . Sepsis ([place]) 01/2016  . Sleep hypopnea   . SVT (supraventricular tachycardia) ([place])    a. 01/2016 - SVT, wide complex tachycardia, and brief afib in setting of severe sepsis.  . Trigger ring finger of left hand   . Vitamin D deficiency   . Vitamin D deficiency     PAST SURGICAL HISTORY: Past Surgical History:  Procedure Laterality Date  . abdominal laparascopic  92  . BLADDER SURGERY    . BREAST SURGERY  2003   bx lt-neg  . CERVICAL CONE BIOPSY    . CHOLECYSTECTOMY N/A 10/26/2014   Procedure: LAPAROSCOPIC CHOLECYSTECTOMY;  Surgeon: Coralie Keens, MD;  Location: [facility];  Service: General;  Laterality: N/A;  . COLONOSCOPY W/ BIOPSIES  multiple  . DIAGNOSTIC LAPAROSCOPY    . DILATION AND CURETTAGE OF UTERUS  07,11  . ESOPHAGOGASTRODUODENOSCOPY  multiple  . KNEE ARTHROSCOPY  07,10   left  . LAPAROSCOPIC VAGINAL HYSTERECTOMY WITH SALPINGO OOPHORECTOMY Bilateral 10/03/2015   Procedure: LAPAROSCOPIC ASSISTED VAGINAL HYSTERECTOMY WITH SALPINGECTOMY;  Surgeon: Eldred Manges, MD;  Location: [facility];  Service: Gynecology;  Laterality: Bilateral;  . left finger surgery    . MASS EXCISION Left 12/24/2012   Procedure: LEFT EXCISION OF PALMAR MASS X TWO;  Surgeon: Roseanne Kaufman, MD;  Location: [facility];  Service: Orthopedics;  Laterality: Left;    SOCIAL HISTORY: Social History  Substance Use Topics  . Smoking status: Never Smoker  . Smokeless tobacco: Never Used  . Alcohol use 0.0 oz/week     Comment: social    FAMILY HISTORY: Family History  Problem Relation Age of Onset  . Heart attack Father   . Heart disease Father   . Coronary artery disease Mother   . Hypertension Mother   . Heart disease Mother   . Heart attack Mother   .  Diabetes Mother   . Hyperlipidemia Mother   . Kidney disease Mother   . Depression Mother   . Sleep apnea Mother   . Obesity Mother   . Hypertension Sister   . Hypertension Brother   . Colon cancer Neg Hx     ROS: Review of Systems  Constitutional: Negative for weight loss.  Gastrointestinal: Negative for nausea and vomiting.  Endo/Heme/Allergies:       Negative hypoglycemia    PHYSICAL EXAM: Blood pressure  122/78, pulse 60, temperature 98.7 F (37.1 C), temperature source Oral, height '5\' 6"'  (1.676 m), weight 223 lb (101.2 kg), last menstrual period 05/20/2008, SpO2 98 %. Body mass index is 35.99 kg/m. Physical Exam  Constitutional: She is oriented to person, place, and time. She appears well-developed and well-nourished.  Cardiovascular: Normal rate.   Pulmonary/Chest: Effort normal.  Musculoskeletal: Normal range of motion.  Neurological: She is oriented to person, place, and time.  Skin: Skin is warm and dry.  Psychiatric: She has a normal mood and affect. Her behavior is normal.  Vitals reviewed.   RECENT LABS AND TESTS: BMET    Component Value Date/Time   NA 141 10/28/2016 1031   K 4.5 10/28/2016 1031   CL 99 10/28/2016 1031   CO2 27 10/28/2016 1031   GLUCOSE 112 (H) 10/28/2016 1031   GLUCOSE 112 (H) 02/02/2016 0357   BUN 13 10/28/2016 1031   CREATININE 0.66 10/28/2016 1031   CREATININE 0.69 01/11/2016 1537   CALCIUM 9.8 10/28/2016 1031   GFRNONAA 98 10/28/2016 1031   GFRAA 113 10/28/2016 1031   Lab Results  Component Value Date   HGBA1C 5.6 10/28/2016   Lab Results  Component Value Date   INSULIN 10.0 10/28/2016   CBC    Component Value Date/Time   WBC 6.6 11/06/2016 1623   RBC 4.99 11/06/2016 1623   HGB 13.7 11/06/2016 1623   HGB 13.7 10/28/2016 1031   HCT 41.9 11/06/2016 1623   HCT 42.6 10/28/2016 1031   PLT 179 11/06/2016 1623   MCV 84.0 11/06/2016 1623   MCV 84 10/28/2016 1031   MCH 27.5 11/06/2016 1623   MCHC 32.7 11/06/2016 1623    RDW 13.2 11/06/2016 1623   RDW 13.0 10/28/2016 1031   LYMPHSABS 594 (L) 11/06/2016 1623   LYMPHSABS 1.2 10/28/2016 1031   MONOABS 330 11/06/2016 1623   EOSABS 0 (L) 11/06/2016 1623   EOSABS 0.2 10/28/2016 1031   BASOSABS 0 11/06/2016 1623   BASOSABS 0.0 10/28/2016 1031   Iron/TIBC/Ferritin/ %Sat No results found for: IRON, TIBC, FERRITIN, IRONPCTSAT Lipid Panel     Component Value Date/Time   CHOL 173 10/28/2016 1031   TRIG 144 10/28/2016 1031   HDL 43 10/28/2016 1031   LDLCALC 101 (H) 10/28/2016 1031   Hepatic Function Panel     Component Value Date/Time   PROT 6.8 10/28/2016 1031   ALBUMIN 4.3 10/28/2016 1031   AST 11 10/28/2016 1031   ALT 14 10/28/2016 1031   ALKPHOS 78 10/28/2016 1031   BILITOT 0.5 10/28/2016 1031      Component Value Date/Time   TSH 2.190 10/28/2016 1031    ASSESSMENT AND PLAN: Type 2 diabetes mellitus without complication, without long-term current use of insulin (HCC)  Class 2 severe obesity with serious comorbidity and body mass index (BMI) of 36.0 to 36.9 in adult, unspecified obesity type ([place])  PLAN:  Diabetes II Alexandra Walker has been given extensive diabetes education by myself today including ideal fasting and post-prandial blood glucose readings, individual ideal Hgb A1c goals and hypoglycemia prevention. We discussed the importance of good blood sugar control to decrease the likelihood of diabetic complications such as nephropathy, neuropathy, limb loss, blindness, coronary artery disease, and death. We discussed the importance of intensive lifestyle modification including diet, exercise and weight loss as the first line treatment for diabetes. Alexandra Walker agrees to continue metformin and will work on decreasing simple carbohydrates and increasing lean protein intake. She will follow up at the agreed upon time.  We spent > than 50% of the 15 minute visit on the counseling as documented in the note.  Obesity Alexandra Walker is currently in the action stage of  change. As such, her goal is to continue with weight loss efforts She has agreed to keep a food journal with 1300 to 1500 calories and 85 grams of protein daily Alexandra Walker has been instructed to work up to a goal of 150 minutes of combined cardio and strengthening exercise per week for weight loss and overall health benefits. We discussed the following Behavioral Modification Strategies today: increasing lean protein intake and decreasing simple carbohydrates   Alexandra Walker has agreed to follow up with our clinic in 2 to 3 weeks. She was informed of the importance of frequent follow up visits to maximize her success with intensive lifestyle modifications for her multiple health conditions.  I, Doreene Nest am acting as transcriptionist for Dennard Nip, MD  I have reviewed the above documentation for accuracy and completeness, and I agree with the above. -Dennard Nip, MD   OBESITY BEHAVIORAL INTERVENTION VISIT  Today's visit was # 6 out of 22.  Starting weight: 234 lbs Starting date: 10/28/16 Today's weight : 223 lbs  Today's date: 02/24/2017 Total lbs lost to date: 11 (Patients must lose 7 lbs in the first 6 months to continue with counseling)   ASK: We discussed the diagnosis of obesity with Alexandra Walker today and Alexandra Walker agreed to give [name] permission to discuss obesity behavioral modification therapy today.  ASSESS: Alexandra Walker has the diagnosis of obesity and her BMI today is 36.01 Alexandra Walker is in the action stage of change   ADVISE: Alexandra Walker was educated on the multiple health risks of obesity as well as the benefit of weight loss to improve her health. She was advised of the need for long term treatment and the importance of lifestyle modifications.  AGREE: Multiple dietary modification options and treatment options were discussed and  Alexandra Walker agreed to keep a food journal with 1300 to 1500 calories and 85 grams of protein daily We discussed the following Behavioral Modification Strategies today:  increasing lean protein intake and decreasing simple carbohydrates

## 2017-03-03 DIAGNOSIS — M65331 Trigger finger, right middle finger: Secondary | ICD-10-CM | POA: Diagnosis not present

## 2017-03-03 DIAGNOSIS — M7712 Lateral epicondylitis, left elbow: Secondary | ICD-10-CM | POA: Diagnosis not present

## 2017-03-07 DIAGNOSIS — M7712 Lateral epicondylitis, left elbow: Secondary | ICD-10-CM | POA: Diagnosis not present

## 2017-03-11 DIAGNOSIS — M7712 Lateral epicondylitis, left elbow: Secondary | ICD-10-CM | POA: Diagnosis not present

## 2017-03-13 ENCOUNTER — Ambulatory Visit (INDEPENDENT_AMBULATORY_CARE_PROVIDER_SITE_OTHER): Payer: 59 | Admitting: Family Medicine

## 2017-03-13 VITALS — BP 127/76 | HR 56 | Temp 98.4°F | Ht 66.0 in | Wt 225.0 lb

## 2017-03-13 DIAGNOSIS — Z6836 Body mass index (BMI) 36.0-36.9, adult: Secondary | ICD-10-CM

## 2017-03-13 DIAGNOSIS — G4733 Obstructive sleep apnea (adult) (pediatric): Secondary | ICD-10-CM | POA: Diagnosis not present

## 2017-03-13 DIAGNOSIS — D1801 Hemangioma of skin and subcutaneous tissue: Secondary | ICD-10-CM | POA: Diagnosis not present

## 2017-03-13 DIAGNOSIS — E559 Vitamin D deficiency, unspecified: Secondary | ICD-10-CM | POA: Diagnosis not present

## 2017-03-13 DIAGNOSIS — D225 Melanocytic nevi of trunk: Secondary | ICD-10-CM | POA: Diagnosis not present

## 2017-03-13 DIAGNOSIS — M7712 Lateral epicondylitis, left elbow: Secondary | ICD-10-CM | POA: Diagnosis not present

## 2017-03-13 DIAGNOSIS — L821 Other seborrheic keratosis: Secondary | ICD-10-CM | POA: Diagnosis not present

## 2017-03-13 DIAGNOSIS — F3289 Other specified depressive episodes: Secondary | ICD-10-CM | POA: Diagnosis not present

## 2017-03-13 DIAGNOSIS — E119 Type 2 diabetes mellitus without complications: Secondary | ICD-10-CM | POA: Diagnosis not present

## 2017-03-13 DIAGNOSIS — D224 Melanocytic nevi of scalp and neck: Secondary | ICD-10-CM | POA: Diagnosis not present

## 2017-03-13 MED ORDER — BUPROPION HCL ER (SR) 150 MG PO TB12: 150.0000 mg | ORAL_TABLET | Freq: Every day | ORAL | 0 refills | Status: DC

## 2017-03-13 MED FILL — BUPROPION SR 150 MG TABLET: 150 | 30 days supply | Qty: 30 | Fill #0

## 2017-03-13 NOTE — Progress Notes (Signed)
Office: [phone]  /  Fax: [phone]   HPI:   Chief Complaint: OBESITY Alexandra Walker is here to discuss her progress with her obesity treatment plan. She is on the Category 2 plan and is following her eating plan approximately 80 % of the time. She states she is exercising 0 minutes 0 times per week. Alexandra Walker is retaining some fluid. She is following the category 2 plan more than not. Alexandra Walker is not exercising due to work hours and fatigue. She is unhappy with our 48 hour cancellation policy and our late policy and is uncertain if she wants to continue with our program. Her weight is 225 lb (102.1 kg) today and has had a weight gain of 2 pounds over a period of 2 weeks since her last visit. She has lost 9 lbs since starting treatment with [name].  Vitamin D deficiency Alexandra Walker has a diagnosis of vitamin D deficiency. She is currently taking OTC vit D 2,000 IU daily and denies nausea, vomiting or muscle weakness.  Diabetes II Alexandra Walker has a diagnosis of diabetes type II. Alexandra Walker states she did not bring blood sugar log and denies any hypoglycemic episodes, nausea or vomiting. Her A1c is well controlled on metformin. She has been working on intensive lifestyle modifications including diet, exercise, and weight loss to help control her blood glucose levels.  Depression with emotional eating behaviors Alexandra Walker has been on wellbutrin in the past. She notes feeling increased irritability, frustration, and decreased energy and mood. She is also struggling with planning meals and scheduling time for the gym. Alexandra Walker struggles with emotional eating and using food for comfort to the extent that it is negatively impacting her health. She often snacks when she is not hungry. Alexandra Walker sometimes feels she is out of control and then feels guilty that she made poor food choices. She has been working on behavior modification techniques to help reduce her emotional eating and has been somewhat successful. She shows no sign of suicidal or  homicidal ideations.  Depression screen PHQ 2/9 10/28/2016  Decreased Interest 1  Down, Depressed, Hopeless 3  PHQ - 2 Score 4  Altered sleeping 2  Tired, decreased energy 2  Change in appetite 2  Feeling bad or failure about yourself  2  Trouble concentrating 3  Moving slowly or fidgety/restless 0  Suicidal thoughts 0  PHQ-9 Score 15      ALLERGIES: Allergies  Allergen Reactions  . Ace Inhibitors Other (See Comments)    coughing  . Oxycodone-Acetaminophen Nausea And Vomiting  . Percocet [Oxycodone-Acetaminophen] Nausea And Vomiting  . Vicodin [Hydrocodone-Acetaminophen] Nausea And Vomiting  . Victoza [Liraglutide] Other (See Comments)    pancreatitis    MEDICATIONS: Current Outpatient Prescriptions on File Prior to Visit  Medication Sig Dispense Refill  . aspirin 325 MG tablet Take 325 mg by mouth daily.    . Blood Glucose Monitoring Suppl (FREESTYLE LITE) DEVI Check blood sugar 2-3 times per day.  Dx:E11.21 1 each 0  . cholecalciferol (VITAMIN D) 1000 UNITS tablet Take 1,000 Units by mouth daily.    . cyclobenzaprine (FLEXERIL) 5 MG tablet Take 1 tablet (5 mg total) by mouth at bedtime as needed for muscle spasms. 30 tablet 2  . glucose blood (FREESTYLE LITE) test strip Check blood sugar 2-3 times per day.  Dx:E11.21. 100 each 12  . ibuprofen (ADVIL,MOTRIN) 200 MG tablet Take 200 mg by mouth every 6 (six) hours as needed.    . Lancets (FREESTYLE) lancets Check blood sugar 2-3 times per  day.  Dx:E11.21 100 each 12  . losartan (COZAAR) 50 MG tablet Take 1 tablet (50 mg total) by mouth daily. 30 tablet 5  . metFORMIN (GLUCOPHAGE) 500 MG tablet Take 1,000 mg by mouth 2 (two) times daily with a meal.    . metoprolol succinate (TOPROL-XL) 50 MG 24 hr tablet Take 1 tablet (50 mg total) by mouth daily. Take with or immediately following a meal. 30 tablet 5  . Omega-3 Fatty Acids (FISH OIL PO) Take 1 tablet by mouth daily.    Marland Kitchen saccharomyces boulardii (FLORASTOR) 250 MG capsule  Take 1 capsule (250 mg total) by mouth 2 (two) times daily. 60 capsule 0  . simvastatin (ZOCOR) 40 MG tablet Take 1 tablet (40 mg total) by mouth every evening. 30 tablet 5  . Turmeric 450 MG CAPS Take 900 mg by mouth daily.     No current facility-administered medications on file prior to visit.     PAST MEDICAL HISTORY: Past Medical History:  Diagnosis Date  . 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 Arthritis   . Back pain   . Chronic depressive disorder   . Chronic edema    bilateral ankles  . Circadian rhythm sleep disorder   . Diabetes ([place])   . Fatty liver   . Gallbladder problem   . Ganglion of knee    left  . GERD (gastroesophageal reflux disease)    rare  . Glucose intolerance (impaired glucose tolerance)   . Hepatic steatosis   . HLA B27 (HLA B27 positive)   . Hx of pancreatitis   . Hyperlipidemia   . Hypertension   . Hypokalemia   . Hypomagnesemia   . IBS (irritable bowel syndrome)   . IBS (irritable bowel syndrome)   . Ischemic colitis ([place])   . Joint pain   . Kyphosis   . Lateral epicondylitis of right elbow   . Leg edema   . Lymphadenopathy   . Migraine headache    history of  . Morbid obesity ([place])   . Onycholysis   . Osteoarthritis   . PAC (premature atrial contraction)    PAC, PJC, managed well with BB  . Paronychia   . Paroxysmal atrial fibrillation ([place])    a. 01/2016 - brief episode during adm for severe sepsis.  Marland Kitchen PMS (premenstrual syndrome)   . PONV (postoperative nausea and vomiting)   . Prediabetes   . Pyelonephritis 01/2016  . Sacroiliitis ([place])   . Seasonal allergies   . Sepsis ([place]) 01/2016  . Sleep hypopnea   . SVT (supraventricular tachycardia) ([place])    a. 01/2016 - SVT, wide complex tachycardia, and brief afib in setting of severe sepsis.  . Trigger ring finger of left hand   . Vitamin D deficiency   . Vitamin D deficiency     PAST SURGICAL HISTORY: Past Surgical History:  Procedure Laterality Date  . abdominal laparascopic   92  . BLADDER SURGERY    . BREAST SURGERY  2003   bx lt-neg  . CERVICAL CONE BIOPSY    . CHOLECYSTECTOMY N/A 10/26/2014   Procedure: LAPAROSCOPIC CHOLECYSTECTOMY;  Surgeon: Coralie Keens, MD;  Location: [facility];  Service: General;  Laterality: N/A;  . COLONOSCOPY W/ BIOPSIES  multiple  . DIAGNOSTIC LAPAROSCOPY    . DILATION AND CURETTAGE OF UTERUS  07,11  . ESOPHAGOGASTRODUODENOSCOPY  multiple  . KNEE ARTHROSCOPY  07,10   left  . LAPAROSCOPIC VAGINAL HYSTERECTOMY WITH SALPINGO OOPHORECTOMY Bilateral 10/03/2015   Procedure:  LAPAROSCOPIC ASSISTED VAGINAL HYSTERECTOMY WITH SALPINGECTOMY;  Surgeon: Eldred Manges, MD;  Location: [facility];  Service: Gynecology;  Laterality: Bilateral;  . left finger surgery    . MASS EXCISION Left 12/24/2012   Procedure: LEFT EXCISION OF PALMAR MASS X TWO;  Surgeon: Roseanne Kaufman, MD;  Location: [facility];  Service: Orthopedics;  Laterality: Left;    SOCIAL HISTORY: Social History  Substance Use Topics  . Smoking status: Never Smoker  . Smokeless tobacco: Never Used  . Alcohol use 0.0 oz/week     Comment: social    FAMILY HISTORY: Family History  Problem Relation Age of Onset  . Heart attack Father   . Heart disease Father   . Coronary artery disease Mother   . Hypertension Mother   . Heart disease Mother   . Heart attack Mother   . Diabetes Mother   . Hyperlipidemia Mother   . Kidney disease Mother   . Depression Mother   . Sleep apnea Mother   . Obesity Mother   . Hypertension Sister   . Hypertension Brother   . Colon cancer Neg Hx     ROS: Review of Systems  Constitutional: Negative for weight loss.  Gastrointestinal: Negative for nausea and vomiting.  Musculoskeletal:       Negative muscle weakness  Endo/Heme/Allergies:       Negative hypoglycemia  Psychiatric/Behavioral: Positive for depression. Negative for suicidal ideas.    PHYSICAL EXAM: Blood pressure 127/76, pulse (!) 56, temperature  98.4 F (36.9 C), temperature source Oral, height '5\' 6"'  (1.676 m), weight 225 lb (102.1 kg), last menstrual period 05/20/2008, SpO2 98 %. Body mass index is 36.32 kg/m. Physical Exam  Constitutional: She is oriented to person, place, and time. She appears well-developed and well-nourished.  Cardiovascular: Normal rate.   Pulmonary/Chest: Effort normal.  Musculoskeletal: Normal range of motion.  Neurological: She is oriented to person, place, and time.  Skin: Skin is warm and dry.  Psychiatric: She has a normal mood and affect. Her behavior is normal.  Vitals reviewed.   RECENT LABS AND TESTS: BMET    Component Value Date/Time   NA 141 10/28/2016 1031   K 4.5 10/28/2016 1031   CL 99 10/28/2016 1031   CO2 27 10/28/2016 1031   GLUCOSE 112 (H) 10/28/2016 1031   GLUCOSE 112 (H) 02/02/2016 0357   BUN 13 10/28/2016 1031   CREATININE 0.66 10/28/2016 1031   CREATININE 0.69 01/11/2016 1537   CALCIUM 9.8 10/28/2016 1031   GFRNONAA 98 10/28/2016 1031   GFRAA 113 10/28/2016 1031   Lab Results  Component Value Date   HGBA1C 5.6 10/28/2016   Lab Results  Component Value Date   INSULIN 10.0 10/28/2016   CBC    Component Value Date/Time   WBC 6.6 11/06/2016 1623   RBC 4.99 11/06/2016 1623   HGB 13.7 11/06/2016 1623   HGB 13.7 10/28/2016 1031   HCT 41.9 11/06/2016 1623   HCT 42.6 10/28/2016 1031   PLT 179 11/06/2016 1623   MCV 84.0 11/06/2016 1623   MCV 84 10/28/2016 1031   MCH 27.5 11/06/2016 1623   MCHC 32.7 11/06/2016 1623   RDW 13.2 11/06/2016 1623   RDW 13.0 10/28/2016 1031   LYMPHSABS 594 (L) 11/06/2016 1623   LYMPHSABS 1.2 10/28/2016 1031   MONOABS 330 11/06/2016 1623   EOSABS 0 (L) 11/06/2016 1623   EOSABS 0.2 10/28/2016 1031   BASOSABS 0 11/06/2016 1623   BASOSABS 0.0 10/28/2016 1031   Iron/TIBC/Ferritin/ %  Sat No results found for: IRON, TIBC, FERRITIN, IRONPCTSAT Lipid Panel     Component Value Date/Time   CHOL 173 10/28/2016 1031   TRIG 144 10/28/2016  1031   HDL 43 10/28/2016 1031   LDLCALC 101 (H) 10/28/2016 1031   Hepatic Function Panel     Component Value Date/Time   PROT 6.8 10/28/2016 1031   ALBUMIN 4.3 10/28/2016 1031   AST 11 10/28/2016 1031   ALT 14 10/28/2016 1031   ALKPHOS 78 10/28/2016 1031   BILITOT 0.5 10/28/2016 1031      Component Value Date/Time   TSH 2.190 10/28/2016 1031    ASSESSMENT AND PLAN: Vitamin D deficiency - Plan: Lipid Panel With LDL/HDL Ratio, VITAMIN D 25 Hydroxy (Vit-D Deficiency, Fractures)  Type 2 diabetes mellitus without complication, without long-term current use of insulin (HCC) - Plan: Comprehensive metabolic panel, Hemoglobin A1c, Insulin, random, Lipid Panel With LDL/HDL Ratio  Other depression - with emotional eating - Plan: buPROPion (WELLBUTRIN SR) 150 MG 12 hr tablet  Class 2 severe obesity with serious comorbidity and body mass index (BMI) of 36.0 to 36.9 in adult, unspecified obesity type ([place])  PLAN:  Vitamin D Deficiency Alexandra Walker was informed that low vitamin D levels contributes to fatigue and are associated with obesity, breast, and colon cancer. She agrees to continue to take OTC vitamin D 2,000 IU daily for now. We will check labs and will follow up for routine testing of vitamin D, at least 2-3 times per year. She was informed of the risk of over-replacement of vitamin D and agrees to not increase her dose unless he discusses this with [name] first.  .Diabetes II Alexandra Walker has been given extensive diabetes education by myself today including ideal fasting and post-prandial blood glucose readings, individual ideal Hgb A1c goals and hypoglycemia prevention. We discussed the importance of good blood sugar control to decrease the likelihood of diabetic complications such as nephropathy, neuropathy, limb loss, blindness, coronary artery disease, and death. We discussed the importance of intensive lifestyle modification including diet, exercise and weight loss as the first line treatment for  diabetes. We will check labs and Providencia agrees to continue her diabetes medications and will follow up at the agreed upon time.  Depression with Emotional Eating Behaviors We discussed behavior modification techniques today to help Alexandra Walker deal with her emotional eating and depression. She has agreed to start Wellbutrin SR 150 mg qam #30 with no refills and will follow up as directed.  Obesity Alexandra Walker is currently in the action stage of change. As such, her goal is to continue with weight loss efforts She has agreed to follow the Category 2 plan Alexandra Walker has been instructed to work up to a goal of 150 minutes of combined cardio and strengthening exercise per week for weight loss and overall health benefits. We discussed the following Behavioral Modification Strategies today: increasing lean protein intake and decreasing simple carbohydrates   Alexandra Walker has agreed to follow up with our clinic in 4 weeks. She was informed of the importance of frequent follow up visits to maximize her success with intensive lifestyle modifications for her multiple health conditions.  I, Doreene Nest, am acting as transcriptionist for Dennard Nip, MD  I have reviewed the above documentation for accuracy and completeness, and I agree with the above. -Dennard Nip, MD    OBESITY BEHAVIORAL INTERVENTION VISIT  Today's visit was # 7 out of 22.  Starting weight: 234 lbs Starting date: 10/28/16 Today's weight : 225 lbs Today's date:  03/13/2017 Total lbs lost to date: 9 (Patients must lose 7 lbs in the first 6 months to continue with counseling)   ASK: We discussed the diagnosis of obesity with Alexandra Walker today and Alexandra Walker agreed to give [name] permission to discuss obesity behavioral modification therapy today.  ASSESS: Alexandra Walker has the diagnosis of obesity and her BMI today is 36.33 Alexandra Walker is in the action stage of change   ADVISE: Alexandra Walker was educated on the multiple health risks of obesity as well as the benefit of  weight loss to improve her health. She was advised of the need for long term treatment and the importance of lifestyle modifications.  AGREE: Multiple dietary modification options and treatment options were discussed and  Alexandra Walker agreed to follow the Category 2 plan We discussed the following Behavioral Modification Strategies today: increasing lean protein intake and decreasing simple carbohydrates

## 2017-03-14 LAB — VITAMIN D 25 HYDROXY (VIT D DEFICIENCY, FRACTURES): VIT D 25 HYDROXY: 37.8 ng/mL (ref 30.0–100.0)

## 2017-03-14 LAB — COMPREHENSIVE METABOLIC PANEL
A/G RATIO: 1.7 (ref 1.2–2.2)
ALBUMIN: 4 g/dL (ref 3.5–5.5)
ALT: 12 IU/L (ref 0–32)
AST: 10 IU/L (ref 0–40)
Alkaline Phosphatase: 77 IU/L (ref 39–117)
BUN / CREAT RATIO: 25 — AB (ref 9–23)
BUN: 22 mg/dL (ref 6–24)
Bilirubin Total: 0.3 mg/dL (ref 0.0–1.2)
CALCIUM: 9.3 mg/dL (ref 8.7–10.2)
CO2: 25 mmol/L (ref 20–29)
Chloride: 103 mmol/L (ref 96–106)
Creatinine, Ser: 0.88 mg/dL (ref 0.57–1.00)
GFR, EST AFRICAN AMERICAN: 83 mL/min/{1.73_m2} (ref >59)
GFR, EST NON AFRICAN AMERICAN: 72 mL/min/{1.73_m2} (ref >59)
GLOBULIN, TOTAL: 2.3 g/dL (ref 1.5–4.5)
Glucose: 112 mg/dL — ABNORMAL HIGH (ref 65–99)
POTASSIUM: 4.5 mmol/L (ref 3.5–5.2)
SODIUM: 140 mmol/L (ref 134–144)
TOTAL PROTEIN: 6.3 g/dL (ref 6.0–8.5)

## 2017-03-14 LAB — LIPID PANEL WITH LDL/HDL RATIO
Cholesterol, Total: 144 mg/dL (ref 100–199)
HDL: 41 mg/dL (ref >39)
LDL Calculated: 78 mg/dL (ref 0–99)
LDL/HDL RATIO: 1.9 ratio (ref 0.0–3.2)
Triglycerides: 125 mg/dL (ref 0–149)
VLDL CHOLESTEROL CAL: 25 mg/dL (ref 5–40)

## 2017-03-14 LAB — INSULIN, RANDOM: INSULIN: 12.5 u[IU]/mL (ref 2.6–24.9)

## 2017-03-14 LAB — HEMOGLOBIN A1C
Est. average glucose Bld gHb Est-mCnc: 111 mg/dL
Hgb A1c MFr Bld: 5.5 % (ref 4.8–5.6)

## 2017-03-17 ENCOUNTER — Encounter: Payer: Self-pay | Admitting: Family Medicine

## 2017-03-17 ENCOUNTER — Ambulatory Visit (INDEPENDENT_AMBULATORY_CARE_PROVIDER_SITE_OTHER): Payer: 59 | Admitting: Family Medicine

## 2017-03-17 VITALS — BP 128/82 | HR 55 | Temp 98.6°F | Ht 66.0 in | Wt 229.0 lb

## 2017-03-17 DIAGNOSIS — Z6841 Body Mass Index (BMI) 40.0 and over, adult: Secondary | ICD-10-CM

## 2017-03-17 DIAGNOSIS — I480 Paroxysmal atrial fibrillation: Secondary | ICD-10-CM | POA: Diagnosis not present

## 2017-03-17 DIAGNOSIS — I10 Essential (primary) hypertension: Secondary | ICD-10-CM

## 2017-03-17 DIAGNOSIS — G4726 Circadian rhythm sleep disorder, shift work type: Secondary | ICD-10-CM | POA: Diagnosis not present

## 2017-03-17 DIAGNOSIS — R7302 Impaired glucose tolerance (oral): Secondary | ICD-10-CM | POA: Diagnosis not present

## 2017-03-17 DIAGNOSIS — N870 Mild cervical dysplasia: Secondary | ICD-10-CM | POA: Insufficient documentation

## 2017-03-17 DIAGNOSIS — I491 Atrial premature depolarization: Secondary | ICD-10-CM | POA: Diagnosis not present

## 2017-03-17 DIAGNOSIS — K760 Fatty (change of) liver, not elsewhere classified: Secondary | ICD-10-CM

## 2017-03-17 DIAGNOSIS — K589 Irritable bowel syndrome without diarrhea: Secondary | ICD-10-CM | POA: Insufficient documentation

## 2017-03-17 DIAGNOSIS — N8110 Cystocele, unspecified: Secondary | ICD-10-CM | POA: Insufficient documentation

## 2017-03-17 MED ORDER — METFORMIN HCL ER 750 MG PO TB24: ORAL_TABLET | ORAL | 2 refills | Status: DC

## 2017-03-17 MED ORDER — METOPROLOL SUCCINATE ER 25 MG PO TB24: 25.0000 mg | ORAL_TABLET | Freq: Every day | ORAL | 2 refills | Status: DC

## 2017-03-17 MED FILL — METFORMIN HCL ER 750 MG TAB: 750 | 90 days supply | Qty: 180 | Fill #0

## 2017-03-17 MED FILL — METOPROLOL SUCC ER 25 MG TA: 25 | 90 days supply | Qty: 90 | Fill #0

## 2017-03-17 NOTE — Progress Notes (Signed)
Alexandra Walker is a 61 y.o. female is here to [place].   Patient Care Team: Briscoe Deutscher, DO as PCP - General (Family Medicine)   History of Present Illness:   HPI:  Health Maintenance Due  Topic Date Due  . PAP SMEAR  05/20/2016  . FOOT EXAM  11/17/2016  . OPHTHALMOLOGY EXAM  01/18/2017   Depression screen PHQ 2/9 10/28/2016  Decreased Interest 1  Down, Depressed, Hopeless 3  PHQ - 2 Score 4  Altered sleeping 2  Tired, decreased energy 2  Change in appetite 2  Feeling bad or failure about yourself  2  Trouble concentrating 3  Moving slowly or fidgety/restless 0  Suicidal thoughts 0  PHQ-9 Score 15   PMHx, SurgHx, SocialHx, Medications, and Allergies were reviewed in the Visit Navigator and updated as appropriate.   Patient Active Problem List   Diagnosis Date Noted  . Cervical intraepithelial neoplasia grade 1 03/17/2017  . Female bladder prolapse 03/17/2017  . Irritable bowel syndrome 03/17/2017  . Class 2 obesity with serious comorbidity and body mass index (BMI) of 35.0 to 35.9 in adult 12/30/2016  . Circadian rhythm sleep disorder, shift work type 09/26/2016  . Impaired glucose tolerance 09/26/2016  . Fatty infiltration of liver 09/26/2016  . OSA on CPAP 09/26/2016  . PAF (paroxysmal atrial fibrillation) ([place]) 02/01/2016  . SVT (supraventricular tachycardia) ([place]) 01/31/2016  . Essential hypertension 01/31/2016  . Controlled type 2 diabetes mellitus with diabetic nephropathy, without long-term current use of insulin ([place]) 01/31/2016  . Postmenopausal bleeding 10/03/2015  . LGSIL (low grade squamous intraepithelial dysplasia) 08/05/2015  . PMB (postmenopausal bleeding) 08/05/2015  . BMI 40.0-44.9, adult ([place]) 05/31/2015  . Dupuytren contracture 05/31/2015  . Elevated LFTs 05/31/2015  . Kyphosis, acquired 05/31/2015  . Premenstrual tension syndrome 05/31/2015  . Tenosynovitis of right foot 05/31/2015  . Allergic rhinitis 05/10/2015  . Cardiac  dysrhythmia 05/10/2015  . Chronic depressive disorder 05/10/2015  . Edema 05/10/2015  . Gastroesophageal reflux disease without esophagitis 05/10/2015  . Hyperlipidemia associated with type 2 diabetes mellitus ([place]) 05/10/2015  . Microalbuminuria 05/10/2015  . Migraine headache without aura 05/10/2015  . Mixed hyperlipidemia 05/10/2015  . Obesity (BMI 30-39.9) 05/10/2015  . Vitamin D deficiency 05/10/2015  . PAC (premature atrial contraction) 09/19/2011  . Morbid obesity due to excess calories ([place]) 08/22/2009   Past Surgical History:  Procedure Laterality Date  . BLADDER SURGERY    . BREAST BIOPSY Left   . CERVICAL CONE BIOPSY    . CHOLECYSTECTOMY N/A 10/26/2014   Procedure: LAPAROSCOPIC CHOLECYSTECTOMY;  Surgeon: Coralie Keens, MD;  Location: [facility];  Service: General;  Laterality: N/A;  . COLONOSCOPY W/ BIOPSIES  multiple  . DIAGNOSTIC LAPAROSCOPY    . DILATION AND CURETTAGE OF UTERUS  07,11  . ESOPHAGOGASTRODUODENOSCOPY  multiple  . KNEE ARTHROSCOPY Left 11/2008  . LAPAROSCOPIC VAGINAL HYSTERECTOMY WITH SALPINGO OOPHORECTOMY Bilateral 10/03/2015   Procedure: LAPAROSCOPIC ASSISTED VAGINAL HYSTERECTOMY WITH SALPINGECTOMY;  Surgeon: Eldred Manges, MD;  Location: [facility];  Service: Gynecology;  Laterality: Bilateral;  . LEFT FINGER SURGERY    . MASS EXCISION Left 12/24/2012   Procedure: LEFT EXCISION OF PALMAR MASS X TWO;  Surgeon: Roseanne Kaufman, MD;  Location: [facility];  Service: Orthopedics;  Laterality: Left;   Family History  Problem Relation Age of Onset  . Heart attack Father   . Heart disease Father   . Coronary artery disease Mother   . Hypertension Mother   .  Heart disease Mother   . Heart attack Mother   . Diabetes Mother   . Hyperlipidemia Mother   . Kidney disease Mother   . Depression Mother   . Sleep apnea Mother   . Obesity Mother   . Hypertension Sister   . Hypertension Brother   . Colon cancer Neg Hx    Social  History  Substance Use Topics  . Smoking status: Never Smoker  . Smokeless tobacco: Never Used  . Alcohol use 0.0 oz/week     Comment: social   Current Medications and Allergies:   .  aspirin 325 MG tablet, Take 325 mg by mouth daily., Disp: , Rfl:  .  Blood Glucose Monitoring Suppl (FREESTYLE LITE) DEVI, Check blood sugar 2-3 times per day.  Dx:E11.21, Disp: 1 each, Rfl: 0 .  buPROPion (WELLBUTRIN SR) 150 MG 12 hr tablet, Take 1 tablet (150 mg total) by mouth daily., Disp: 30 tablet, Rfl: 0 .  cholecalciferol (VITAMIN D) 1000 UNITS tablet, Take 1,000 Units by mouth daily., Disp: , Rfl:  .  cyclobenzaprine (FLEXERIL) 5 MG tablet, Take 1 tablet (5 mg total) by mouth at bedtime as needed for muscle spasms., Disp: 30 tablet, Rfl: 2 .  diclofenac sodium (VOLTAREN) 1 % GEL, APPLY 2 GRAMS TOPICALLY 2 TIMES DAILY AS NEEDED. NOTE: 2 GRAMS IS EQUIVALENT TO A NICKEL SIZED AMOUNT., Disp: , Rfl:  .  DOCOSAHEXAENOIC ACID PO, Take by mouth., Disp: , Rfl:  .  glucose blood (FREESTYLE LITE) test strip, Check blood sugar 2-3 times per day.  Dx:E11.21., Disp: 100 each, Rfl: 12 .  ibuprofen (ADVIL,MOTRIN) 200 MG tablet, Take 200 mg by mouth every 6 (six) hours as needed., Disp: , Rfl:  .  Lancets (FREESTYLE) lancets, Check blood sugar 2-3 times per day.  Dx:E11.21, Disp: 100 each, Rfl: 12 .  loratadine (CLARITIN) 10 MG tablet, Take by mouth., Disp: , Rfl:  .  losartan (COZAAR) 50 MG tablet, Take 1 tablet (50 mg total) by mouth daily., Disp: 30 tablet, Rfl: 5 .  metFORMIN (GLUCOPHAGE) 500 MG tablet, Take 1,000 mg by mouth 2 (two) times daily with a meal., Disp: , Rfl:  .  metoprolol succinate (TOPROL-XL) 50 MG 24 hr tablet, Take 1 tablet (50 mg total) by mouth daily. Take with or immediately following a meal., Disp: 30 tablet, Rfl: 5 .  Omega-3 Fatty Acids (FISH OIL PO), Take 1 tablet by mouth daily., Disp: , Rfl:  .  saccharomyces boulardii (FLORASTOR) 250 MG capsule, Take 1 capsule (250 mg total) by mouth 2  (two) times daily., Disp: 60 capsule, Rfl: 0 .  simvastatin (ZOCOR) 40 MG tablet, Take 1 tablet (40 mg total) by mouth every evening., Disp: 30 tablet, Rfl: 5 .  Turmeric 450 MG CAPS, Take 900 mg by mouth daily., Disp: , Rfl:  .  metFORMIN (GLUCOPHAGE XR) 750 MG 24 hr tablet, Take two daily before breakfast., Disp: 180 tablet, Rfl: 2 .  metoprolol succinate (TOPROL-XL) 25 MG 24 hr tablet, Take 1 tablet (25 mg total) by mouth daily., Disp: 90 tablet, Rfl: 2  Allergies  Allergen Reactions  . Ace Inhibitors Other (See Comments)    coughing  . Oxycodone-Acetaminophen Nausea And Vomiting  . Percocet [Oxycodone-Acetaminophen] Nausea And Vomiting  . Vicodin [Hydrocodone-Acetaminophen] Nausea And Vomiting  . Victoza [Liraglutide] Other (See Comments)    pancreatitis   Review of Systems:   Pertinent items are noted in the HPI. Otherwise, ROS is negative.  Vitals:   Vitals:  03/17/17 1103  BP: 128/82  Pulse: (!) 55  Temp: 98.6 F (37 C)  TempSrc: Oral  SpO2: 96%  Weight: 229 lb (103.9 kg)  Height: 5\' 6"  (1.676 m)     Body mass index is 36.96 kg/m.   Physical Exam:   Physical Exam  Constitutional: She appears well-nourished.  HENT:  Head: Normocephalic and atraumatic.  Eyes: Pupils are equal, round, and reactive to light. EOM are normal.  Neck: Normal range of motion. Neck supple.  Cardiovascular: Normal rate, regular rhythm, normal heart sounds and intact distal pulses.   Pulmonary/Chest: Effort normal.  Abdominal: Soft.  Skin: Skin is warm.  Psychiatric: She has a normal mood and affect. Her behavior is normal.  Nursing note and vitals reviewed.  Results for orders placed or performed in visit on 03/13/17  Comprehensive metabolic panel  Result Value Ref Range   Glucose 112 (H) 65 - 99 mg/dL   BUN 22 6 - 24 mg/dL   Creatinine, Ser 0.88 0.57 - 1.00 mg/dL   GFR calc non Af Amer 72 >59 mL/min/1.73   GFR calc Af Amer 83 >59 mL/min/1.73   BUN/Creatinine Ratio 25 (H) 9 -  23   Sodium 140 134 - 144 mmol/L   Potassium 4.5 3.5 - 5.2 mmol/L   Chloride 103 96 - 106 mmol/L   CO2 25 20 - 29 mmol/L   Calcium 9.3 8.7 - 10.2 mg/dL   Total Protein 6.3 6.0 - 8.5 g/dL   Albumin 4.0 3.5 - 5.5 g/dL   Globulin, Total 2.3 1.5 - 4.5 g/dL   Albumin/Globulin Ratio 1.7 1.2 - 2.2   Bilirubin Total 0.3 0.0 - 1.2 mg/dL   Alkaline Phosphatase 77 39 - 117 IU/L   AST 10 0 - 40 IU/L   ALT 12 0 - 32 IU/L  Hemoglobin A1c  Result Value Ref Range   Hgb A1c MFr Bld 5.5 4.8 - 5.6 %   Est. average glucose Bld gHb Est-mCnc 111 mg/dL  Insulin, random  Result Value Ref Range   INSULIN 12.5 2.6 - 24.9 uIU/mL  Lipid Panel With LDL/HDL Ratio  Result Value Ref Range   Cholesterol, Total 144 100 - 199 mg/dL   Triglycerides 125 0 - 149 mg/dL   HDL 41 >39 mg/dL   VLDL Cholesterol Cal 25 5 - 40 mg/dL   LDL Calculated 78 0 - 99 mg/dL   LDl/HDL Ratio 1.9 0.0 - 3.2 ratio  VITAMIN D 25 Hydroxy (Vit-D Deficiency, Fractures)  Result Value Ref Range   Vit D, 25-Hydroxy 37.8 30.0 - 100.0 ng/mL   Assessment and Plan:   Problem  Circadian Rhythm Sleep Disorder, Shift Work Type   She is working on changing her schedule somewhat to help this.   Fatty Infiltration of Liver   The patient is asked to make an attempt to improve diet and exercise patterns to aid in medical management of this problem.    Paf (Paroxysmal Atrial Fibrillation) ([facility])   Stress related 01/31/16. Resolved spontaneously after 3 hours. Occurred in setting of fever 105 and rigors related to UTI/sepsis.  CHADS VASC 3. Will need oral anticoagulation if recurrent AF, non stress related.   Essential Hypertension   Avoiding excessive salt intake? []   YES  [x]   NO Trying to exercise on a regular basis? []   YES  [x]   NO Review: taking medications as instructed, no medication side effects noted, no TIAs, no chest pain on exertion, no dyspnea on exertion, no swelling of ankles.  Wt Readings from Last 3 Encounters:  03/17/17 229  lb (103.9 kg)  03/13/17 225 lb (102.1 kg)  02/24/17 223 lb (101.2 kg)   Reports that  has never smoked. she has never used smokeless tobacco.  BP Readings from Last 3 Encounters:  03/17/17 128/82  03/13/17 127/76  02/24/17 122/78   Lab Results  Component Value Date   CREATININE 0.88 03/13/2017     Pac (Premature Atrial Contraction)   Vitals:   03/17/17 1103  BP: 128/82  Pulse: (!) 55  Temp: 98.6 F (37 C)  SpO2: 96%   Consider lower dose of Metoprolol to combat fatigue and weight gain. No ectopy heard today.    Impaired Glucose Tolerance (Resolved)     . Reviewed expectations re: course of current medical issues. . Discussed self-management of symptoms. . Outlined signs and symptoms indicating need for more acute intervention. . Patient verbalized understanding and all questions were answered. Marland Kitchen Health Maintenance issues including appropriate healthy diet, exercise, and smoking avoidance were discussed with patient. . See orders for this visit as documented in the electronic medical record. . Patient received an After Visit Summary.  Briscoe Deutscher, DO [facility] 03/17/2017  Future Appointments Date Time Provider [place]  03/31/2017 8:30 AM Dohmeier, Asencion Partridge, MD [facility] None  04/17/2017 10:40 AM Starlyn Skeans, MD [facility] None  06/17/2017 10:45 AM Briscoe Deutscher, DO [facility] None

## 2017-03-20 DIAGNOSIS — M7712 Lateral epicondylitis, left elbow: Secondary | ICD-10-CM | POA: Diagnosis not present

## 2017-03-25 DIAGNOSIS — K760 Fatty (change of) liver, not elsewhere classified: Secondary | ICD-10-CM | POA: Diagnosis not present

## 2017-03-26 ENCOUNTER — Encounter: Payer: Self-pay | Admitting: Neurology

## 2017-03-31 ENCOUNTER — Encounter: Payer: Self-pay | Admitting: Neurology

## 2017-03-31 ENCOUNTER — Ambulatory Visit: Payer: 59 | Admitting: Neurology

## 2017-03-31 VITALS — BP 138/80 | HR 58 | Ht 66.0 in | Wt 231.0 lb

## 2017-03-31 DIAGNOSIS — G4733 Obstructive sleep apnea (adult) (pediatric): Secondary | ICD-10-CM | POA: Diagnosis not present

## 2017-03-31 DIAGNOSIS — G4726 Circadian rhythm sleep disorder, shift work type: Secondary | ICD-10-CM | POA: Insufficient documentation

## 2017-03-31 DIAGNOSIS — Z9989 Dependence on other enabling machines and devices: Secondary | ICD-10-CM | POA: Diagnosis not present

## 2017-03-31 DIAGNOSIS — I480 Paroxysmal atrial fibrillation: Secondary | ICD-10-CM | POA: Diagnosis not present

## 2017-03-31 NOTE — Progress Notes (Signed)
SLEEP MEDICINE CLINIC   Provider:  Larey Walker, M D  Referring Provider: Shelda Walker* Primary Care Physician:  Alexandra Deutscher, DO  Chief Complaint  Patient presents with  . Follow-up    pt alone, rm 11. pt states that CPAP is working well. 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  It was a busy night she reports.  She has been able on none call nights to be a very compliant CPAP user and we have discussed in our previous visits that I cannot expect her to use the CPAP every night on call.  She has a 93% compliance for days and 67% compliance for time.  Average use at time is 5 hours and 10 minutes, CPAP is set at 8 cmH2O pressure with 3 cm EPR and her residual AHI is only 0.3/h.  She has very minimal air leakage and no central apneas were noted.  Overall I do not need to make any adjustments.  She endorsed the Epworth sleepiness score at 4 points out of 24. She reports insomnia after a busy call night ( Wellbutrin 150 mg XR 12 in AM).      Alexandra Walker is a 61 y.o. female , seen here as a referral  from Alexandra Walker for a new evaluation of her sleep disorder,  Interval history from 09/26/2016. Alexandra Walker returned after a ordered a CPAP titration for her based on a recent referral by Alexandra Walker, D.O. The sleep study was ordered based on a baseline polysomnography performed at the [facility] in December 2017, which had diagnosed the patient with sleep apnea, hypopnea at an AHI of 21 per hour, moderate hypoxemia of sleep, mean saturation was only 87% SPO2 raising the question of an underlying cardiopulmonary disease but probably hypoventilation due to obesity. The patient was titrated to 8 cm water pressure the AHI was 0.9 per hour, she did not produce any periodic limb movements oxygen nadir rose to 85% of the time spent at 88% saturation or below  equal at only 25 minutes. She was fitted with a nasal pillow while using a mouthguard.  As the pleasure to look at the patient's compliance record today, which is excellent she has used to machine 28 out of 30 days with a 77% compliance but hours. 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 Average user time is 5 hours 21 minutes, residual apnea index is 0.3 the 95th percentile pressure has been met, she does not have air leaks. I would like for her to continue with the current machine and settings as well as the interface. The patient also reported that her hepatologist at [facility] voiced approval of CPAP therapy for all patients with fatty liver disease. I hope that the CPAP will help her to reduce body weight, promote sleep before midnight allowing her to have more balanced insulin household, and continues to allow her be less daytime sleepy. She endorsed today the Epworth sleepiness score at only 5 points and fatigue severity score was 36 points. She has visibly lost weight, her face and neck are smaller and I adjusted the measurements in my physical exam note. I will order a ONO for her  After she mentioned that her dentist felt continuous clenching represents hypoxemia- or a reaction to low oxygen.   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 She was exhausted. In the meantime she has made attempts to lose some weight, she joined tae kwon do classes, and has lost another 14 pounds since joining the gym in October. She is also with a new primary care physician Alexandra Walker. Until recently she was followed by Dr. Addison Walker, whose office is now affiliated with the [place] [facility] system and out of network. She is taking metformin 1000 g twice a day right now, still considered prediabetic ( ?).  Her after  meal the glucometer readings have been in normal range but her fasting morning glucose is elevated constantly. She reports measuring 130s 140s.  She was also diagnosed with a fatty liver, non-hepatitic. She has joined [facility], a biopsy was recently scheduled. Negative fibrosis scan.  She also has chronic lower extremity edema, monopolar depressive disorder, circadian rhythm sleep disorder, diabetes, Baker's cyst and ganglion of the left knee, GERD, anticardiolipin syndrome, hepatic steatosis, hyperlipidemia, hypertension, hypokalemia, hypomagnesemia, sacroiliitis. She was admitted with sepsis in September 2017, was very sick and developed supraventricular tachycardia in the setting as well as a brief run of atrial fibrillation. Her follow-up cardiac studies returned normal. There is a concern that the patient with his morning glucose levels lacks slow wave sleep or delta sleep, thereby is not generating enough insulin or human growth hormone. Alexandra Walker referred her to Alexandra Walker for sleep study AHI during REM sleep was 26.1 per hour supine AHI 81.2 per hour REM latency was very long 273 minutes. Sleep efficiency was 89% oxygen nadir 77% mean oxygen saturation was 87.8. No PLMS noted.  Overall AHI was 21.0 per hour. Given these data are not strongly recommend CPAP titration.  She got no results of this sleep study, and no appointment  Within 3 or 4 weeks after sleep test, she called our office to have an outside sleep study interpreted and explained. Sleep habits are as follows: bedtime is 11 PM , asleep by 11.  30 minutes - sleeping through for 6 hours, and when not working ,may have fragmented , dream rich  sleep until 10 AM. Sleep in a cool, quiet and dark room, alone. One pillow, sleeping on the side. Neck hurts, uses special pillows. Has a stuffy nose, uses afrin. . Social history:   Single, mother on BiPAP-  Never used CPAP. Midwife, losing weight, going to the gym. No tobacco  use , no ETOH use. Low carb. Caffeine - 1 cup a day. 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    Review of Systems: Out of a complete 14 system review, the patient complains of only the following symptoms, and all other reviewed systems are negative.  Given that she has a history of rhinitis and stuffy nose I would like for her to have heated humidification and try and nasal mask, nasal pillow and full face mask for comfort. She should feel free to choose her interface. She endorsed the Epworth at 5 from 8, fatigue severity at 36 points.   Social History   Socioeconomic History  . Marital status: Divorced    Spouse name: Not on file  . Number of children: 1  . Years of education: Not on file  . Highest education  level: Not on file  Social Needs  . Financial resource strain: Not on file  . Food insecurity - worry: Not on file  . Food insecurity - inability: Not on file  . Transportation needs - medical: Not on file  . Transportation needs - non-medical: Not on file  Occupational History  . Occupation: [place]    Employer: [employer]  Tobacco Use  . Smoking status: Never Smoker  . Smokeless tobacco: Never Used  Substance and Sexual Activity  . Alcohol use: Yes    Alcohol/week: 0.0 oz    Comment: social  . Drug use: No  . Sexual activity: Not on file  Other Topics Concern  . 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    Family History  Problem Relation Age of Onset  . Heart attack Father   . Heart disease Father   . Coronary artery disease Mother   . Hypertension Mother   . Heart disease Mother   . Heart attack Mother   . Diabetes Mother   . Hyperlipidemia Mother   . Kidney disease Mother   . Depression Mother   . Sleep apnea Mother   . Obesity Mother   . Hypertension Sister   . Hypertension Brother   . Colon cancer Neg Hx     Past  Medical History:  Diagnosis Date  . 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[place])   . Arthritis   . Back pain   . Chronic depressive disorder   . Chronic edema    bilateral ankles  . Circadian rhythm sleep disorder   . Diabetes ([place])   . Fatty liver   . Ganglion of knee    left  . GERD (gastroesophageal reflux disease)    rare  . Glucose intolerance (impaired glucose tolerance)   . Hepatic steatosis   . HLA B27 (HLA B27 positive)   . Hx of pancreatitis   . Hyperlipidemia   . Hypertension   . IBS (irritable bowel syndrome)   . IBS (irritable bowel syndrome)   . Ischemic colitis ([place])   . Joint pain   . Kyphosis   . Lateral epicondylitis of right elbow   . Leg edema   . Lymphadenopathy   . Migraine headache    history of  . Osteoarthritis   . PAC (premature atrial contraction)    PAC, PJC, managed well with BB  . Paronychia   . Paroxysmal atrial fibrillation ([place])    a. 01/2016 - brief episode during adm for severe sepsis.  Marland Kitchen PMS (premenstrual syndrome)   . PONV (postoperative nausea and vomiting)   . Pyelonephritis 01/2016  . Sacroiliitis ([place])   . Seasonal allergies   . Sepsis ([place]) 01/2016  . Sleep hypopnea   . SVT (supraventricular tachycardia) ([place])    a. 01/2016 - SVT, wide complex tachycardia, and brief afib in setting of severe sepsis.  . Trigger ring finger of left hand   . Vitamin D deficiency     Past Surgical History:  Procedure Laterality Date  . BLADDER SURGERY    . BREAST BIOPSY Left   . CERVICAL CONE BIOPSY    . COLONOSCOPY W/ BIOPSIES  multiple  . DIAGNOSTIC LAPAROSCOPY    . DILATION AND CURETTAGE OF UTERUS  07,11  . ESOPHAGOGASTRODUODENOSCOPY  multiple  . KNEE ARTHROSCOPY Left 11/2008  . LEFT FINGER SURGERY      Current Outpatient Medications  Medication Sig Dispense Refill  . aspirin  325 MG tablet Take 325 mg by mouth daily.    . Blood Glucose Monitoring Suppl (FREESTYLE LITE) DEVI Check blood sugar 2-3 times per day.  Dx:E11.21 (Patient not taking:  Reported on 03/31/2017) 1 each 0  . buPROPion (WELLBUTRIN SR) 150 MG 12 hr tablet Take 1 tablet (150 mg total) by mouth daily. (Patient not taking: Reported on 03/31/2017) 30 tablet 0  . cholecalciferol (VITAMIN D) 1000 UNITS tablet Take 1,000 Units by mouth daily.    . cyclobenzaprine (FLEXERIL) 5 MG tablet Take 1 tablet (5 mg total) by mouth at bedtime as needed for muscle spasms. (Patient not taking: Reported on 03/31/2017) 30 tablet 2  . diclofenac sodium (VOLTAREN) 1 % GEL APPLY 2 GRAMS TOPICALLY 2 TIMES DAILY AS NEEDED. NOTE: 2 GRAMS IS EQUIVALENT TO A NICKEL SIZED AMOUNT.    . DOCOSAHEXAENOIC ACID PO Take by mouth.    Marland Kitchen glucose blood (FREESTYLE LITE) test strip Check blood sugar 2-3 times per day.  Dx:E11.21. (Patient not taking: Reported on 03/31/2017) 100 each 12  . ibuprofen (ADVIL,MOTRIN) 200 MG tablet Take 200 mg by mouth every 6 (six) hours as needed.    . Lancets (FREESTYLE) lancets Check blood sugar 2-3 times per day.  Dx:E11.21 (Patient not taking: Reported on 03/31/2017) 100 each 12  . loratadine (CLARITIN) 10 MG tablet Take by mouth.    . losartan (COZAAR) 50 MG tablet Take 1 tablet (50 mg total) by mouth daily. (Patient not taking: Reported on 03/31/2017) 30 tablet 5  . metFORMIN (GLUCOPHAGE XR) 750 MG 24 hr tablet Take two daily before breakfast. (Patient not taking: Reported on 03/31/2017) 180 tablet 2  . metFORMIN (GLUCOPHAGE) 500 MG tablet Take 1,000 mg by mouth 2 (two) times daily with a meal.    . metoprolol succinate (TOPROL-XL) 25 MG 24 hr tablet Take 1 tablet (25 mg total) by mouth daily. (Patient not taking: Reported on 03/31/2017) 90 tablet 2  . metoprolol succinate (TOPROL-XL) 50 MG 24 hr tablet Take 1 tablet (50 mg total) by mouth daily. Take with or immediately following a meal. (Patient not taking: Reported on 03/31/2017) 30 tablet 5  . Omega-3 Fatty Acids (FISH OIL PO) Take 1 tablet by mouth daily.    Marland Kitchen saccharomyces boulardii (FLORASTOR) 250 MG capsule Take 1  capsule (250 mg total) by mouth 2 (two) times daily. (Patient not taking: Reported on 03/31/2017) 60 capsule 0  . simvastatin (ZOCOR) 40 MG tablet Take 1 tablet (40 mg total) by mouth every evening. (Patient not taking: Reported on 03/31/2017) 30 tablet 5  . Turmeric 450 MG CAPS Take 900 mg by mouth daily.     No current facility-administered medications for this visit.     Allergies as of 03/31/2017 - Review Complete 03/31/2017  Allergen Reaction Noted  . Ace inhibitors Other (See Comments) 09/05/2015  . Oxycodone-acetaminophen Nausea And Vomiting 09/19/2011  . Percocet [oxycodone-acetaminophen] Nausea And Vomiting 09/19/2011  . Vicodin [hydrocodone-acetaminophen] Nausea And Vomiting 09/19/2011  . Victoza [liraglutide] Other (See Comments) 09/01/2014    Vitals: BP 138/80   Pulse (!) 58   Ht '5\' 6"'  (1.676 m)   Wt 231 lb (104.8 kg)   LMP 05/20/2008   BMI 37.28 kg/m  Last Weight:  Wt Readings from Last 1 Encounters:  03/31/17 231 lb (104.8 kg)   UEK:CMKL mass index is 37.28 kg/m.     Last Height:   Ht Readings from Last 1 Encounters:  03/31/17 '5\' 6"'  (1.676 m)    Physical exam:  General: The patient is awake, alert and appears not in acute distress. The patient is well groomed. Head: Normocephalic, atraumatic. Neck is supple. Mallampati 4  neck circumference: 15.25 . Nasal airflow restricted, TMJ is evident. Retrognathia is not seen.  Bruxism marks ! . Still wears mouth guard, which fits unde nasal pillows/ nasal  ask.  Cardiovascular:  Regular rate and rhythm, 53 bpm.  Respiratory: Lungs are clear to auscultation. Skin:  Ankle  Edema, mild  Trunk: BMI is 38.9 . The patient's posture is erect-   Neurologic exam :The patient is awake and alert, oriented to place and time.   Mood and affect are appropriate, she is happy.  Cranial nerves:Pupils are equal and briskly reactive to light. Extraocular movements  in vertical and horizontal planes intact and without nystagmus. Visual  fields by finger perimetry are intact.Hearing to finger rub intact. Facial sensation intact to fine touch. Facial motor strength is symmetric and tongue and uvula move midline. Tongue protrusion into cheeks is strong.  Shoulder shrug was symmetrical.   The patient was advised of the nature of the diagnosed sleep disorder, the treatment options and risks for general a health and wellness arising from not treating the condition.  I spent more than 25  minutes of face to face time with the patient.  Greater than 50% of time was spent in counseling and coordination of care. We have discussed the diagnosis and differential and I answered the patient's questions.     Assessment:  After physical and neurologic examination, review of laboratory studies,  Personal review of imaging studies, reports of other /same  Imaging studies ,  Results of polysomnography/ neurophysiology testing and pre-existing records as far as provided in visit., my assessment is   1) OSA- Patients with REM dependent sleep apnea also do not benefit from ENT surgery. She uses CPAP at 8 cm with 3 cm EPR , highly compliant . Nasal mask better than nasal pillow. ONO was negative . Dental device did not help as much.   2)Obesity She lost 40 pounds since October 2017 .  Dr Dennard Nip  Follows her for weight loss.  impaired fasting glucose, fatty liver, morbid obesity. Dr Orland Dec follows her at [facility]. These conditions are also promoted by shift work and poor sleep quality , too.   3) shift work Pharmacologist,  This is her second sleep disorder. Wellbutrin XR seems not to hinder her to go to sleep.  She will start a night shift rotation 2 days and 2 nights alternating.     Rv in 12 month with me.   Asencion Partridge Anjel Pardo MD  03/31/2017   CC: Dennard Nip, MD         Alexandra Deutscher, DO, MS.  [place].

## 2017-04-01 MED FILL — SIMVASTATIN 40 MG TABLET: 40 | 90 days supply | Qty: 90 | Fill #1

## 2017-04-01 MED FILL — LOSARTAN POTASSIUM 50 MG TA: 50 | 90 days supply | Qty: 90 | Fill #1

## 2017-04-01 MED FILL — FREESTYLE LITE TEST STRIP: 30 days supply | Qty: 100 | Fill #1

## 2017-04-02 ENCOUNTER — Encounter: Payer: Self-pay | Admitting: Physician Assistant

## 2017-04-02 ENCOUNTER — Ambulatory Visit (INDEPENDENT_AMBULATORY_CARE_PROVIDER_SITE_OTHER): Payer: 59 | Admitting: Physician Assistant

## 2017-04-02 ENCOUNTER — Ambulatory Visit: Payer: 59 | Admitting: Family Medicine

## 2017-04-02 VITALS — BP 118/72 | HR 60 | Temp 98.6°F | Ht 66.0 in | Wt 228.0 lb

## 2017-04-02 DIAGNOSIS — H6123 Impacted cerumen, bilateral: Secondary | ICD-10-CM | POA: Diagnosis not present

## 2017-04-02 DIAGNOSIS — M7712 Lateral epicondylitis, left elbow: Secondary | ICD-10-CM | POA: Diagnosis not present

## 2017-04-02 NOTE — Progress Notes (Signed)
Alexandra Walker is a 61 y.o. female here for a new problem.  History of Present Illness:   Chief Complaint  Patient presents with  . Ear Fullness    HPI  Patient presents with a week-long history of ear fullness. She states that approximately once a year she requires an ear lavage. She denies: recent URI, recent air travel, dizziness, decreased hearing, fever, changes in balance. She does not use anything to clean her ears at home.   Past Medical History:  Diagnosis Date  . 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 Arthritis   . Back pain   . Chronic depressive disorder   . Chronic edema    bilateral ankles  . Circadian rhythm sleep disorder   . Diabetes ([place])   . Fatty liver   . Ganglion of knee    left  . GERD (gastroesophageal reflux disease)    rare  . Glucose intolerance (impaired glucose tolerance)   . Hepatic steatosis   . HLA B27 (HLA B27 positive)   . Hx of pancreatitis   . Hyperlipidemia   . Hypertension   . IBS (irritable bowel syndrome)   . IBS (irritable bowel syndrome)   . Ischemic colitis ([place])   . Joint pain   . Kyphosis   . Lateral epicondylitis of right elbow   . Leg edema   . Lymphadenopathy   . Migraine headache    history of  . Osteoarthritis   . PAC (premature atrial contraction)    PAC, PJC, managed well with BB  . Paronychia   . Paroxysmal atrial fibrillation ([place])    a. 01/2016 - brief episode during adm for severe sepsis.  Marland Kitchen PMS (premenstrual syndrome)   . PONV (postoperative nausea and vomiting)   . Pyelonephritis 01/2016  . Sacroiliitis ([place])   . Seasonal allergies   . Sepsis ([place]) 01/2016  . Sleep hypopnea   . SVT (supraventricular tachycardia) ([place])    a. 01/2016 - SVT, wide complex tachycardia, and brief afib in setting of severe sepsis.  . Trigger ring finger of left hand   . Vitamin D deficiency      Social History   Socioeconomic History  . Marital status: Divorced    Spouse name: Not on file  . Number of children: 1  .  Years of education: Not on file  . Highest education level: Not on file  Social Needs  . Financial resource strain: Not on file  . Food insecurity - worry: Not on file  . Food insecurity - inability: Not on file  . Transportation needs - medical: Not on file  . Transportation needs - non-medical: Not on file  Occupational History  . Occupation: [employer]    Employer: [employer]  Tobacco Use  . Smoking status: Never Smoker  . Smokeless tobacco: Never Used  Substance and Sexual Activity  . Alcohol use: Yes    Alcohol/week: 0.0 oz    Comment: social  . Drug use: No  . Sexual activity: Not on file  Other Topics Concern  . 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    Past Surgical History:  Procedure Laterality Date  . BLADDER SURGERY    . BREAST BIOPSY Left   . CERVICAL CONE BIOPSY    . COLONOSCOPY W/ BIOPSIES  multiple  . DIAGNOSTIC LAPAROSCOPY    . DILATION AND CURETTAGE OF UTERUS  07,11  . ESOPHAGOGASTRODUODENOSCOPY  multiple  .  KNEE ARTHROSCOPY Left 11/2008  . LEFT FINGER SURGERY      Family History  Problem Relation Age of Onset  . Heart attack Father   . Heart disease Father   . Coronary artery disease Mother   . Hypertension Mother   . Heart disease Mother   . Heart attack Mother   . Diabetes Mother   . Hyperlipidemia Mother   . Kidney disease Mother   . Depression Mother   . Sleep apnea Mother   . Obesity Mother   . Hypertension Sister   . Hypertension Brother   . Colon cancer Neg Hx     Allergies  Allergen Reactions  . Ace Inhibitors Other (See Comments)    coughing  . Oxycodone-Acetaminophen Nausea And Vomiting  . Percocet [Oxycodone-Acetaminophen] Nausea And Vomiting  . Vicodin [Hydrocodone-Acetaminophen] Nausea And Vomiting  . Victoza [Liraglutide] Other (See Comments)    pancreatitis    Current Medications:   Current Outpatient Medications:  .  aspirin 325 MG tablet, Take 325 mg  by mouth daily., Disp: , Rfl:  .  Blood Glucose Monitoring Suppl (FREESTYLE LITE) DEVI, Check blood sugar 2-3 times per day.  Dx:E11.21, Disp: 1 each, Rfl: 0 .  buPROPion (WELLBUTRIN SR) 150 MG 12 hr tablet, Take 1 tablet (150 mg total) by mouth daily., Disp: 30 tablet, Rfl: 0 .  cholecalciferol (VITAMIN D) 1000 UNITS tablet, Take 1,000 Units by mouth daily., Disp: , Rfl:  .  cyclobenzaprine (FLEXERIL) 5 MG tablet, Take 1 tablet (5 mg total) by mouth at bedtime as needed for muscle spasms., Disp: 30 tablet, Rfl: 2 .  diclofenac sodium (VOLTAREN) 1 % GEL, APPLY 2 GRAMS TOPICALLY 2 TIMES DAILY AS NEEDED. NOTE: 2 GRAMS IS EQUIVALENT TO A NICKEL SIZED AMOUNT., Disp: , Rfl:  .  DOCOSAHEXAENOIC ACID PO, Take by mouth., Disp: , Rfl:  .  glucose blood (FREESTYLE LITE) test strip, Check blood sugar 2-3 times per day.  Dx:E11.21., Disp: 100 each, Rfl: 12 .  ibuprofen (ADVIL,MOTRIN) 200 MG tablet, Take 200 mg by mouth every 6 (six) hours as needed., Disp: , Rfl:  .  Lancets (FREESTYLE) lancets, Check blood sugar 2-3 times per day.  Dx:E11.21, Disp: 100 each, Rfl: 12 .  loratadine (CLARITIN) 10 MG tablet, Take by mouth., Disp: , Rfl:  .  losartan (COZAAR) 50 MG tablet, Take 1 tablet (50 mg total) by mouth daily., Disp: 30 tablet, Rfl: 5 .  metFORMIN (GLUCOPHAGE XR) 750 MG 24 hr tablet, Take two daily before breakfast., Disp: 180 tablet, Rfl: 2 .  metFORMIN (GLUCOPHAGE) 500 MG tablet, Take 1,000 mg by mouth 2 (two) times daily with a meal., Disp: , Rfl:  .  metoprolol succinate (TOPROL-XL) 25 MG 24 hr tablet, Take 1 tablet (25 mg total) by mouth daily., Disp: 90 tablet, Rfl: 2 .  metoprolol succinate (TOPROL-XL) 50 MG 24 hr tablet, Take 1 tablet (50 mg total) by mouth daily. Take with or immediately following a meal., Disp: 30 tablet, Rfl: 5 .  Omega-3 Fatty Acids (FISH OIL PO), Take 1 tablet by mouth daily., Disp: , Rfl:  .  saccharomyces boulardii (FLORASTOR) 250 MG capsule, Take 1 capsule (250 mg total) by  mouth 2 (two) times daily., Disp: 60 capsule, Rfl: 0 .  simvastatin (ZOCOR) 40 MG tablet, Take 1 tablet (40 mg total) by mouth every evening., Disp: 30 tablet, Rfl: 5 .  Turmeric 450 MG CAPS, Take 900 mg by mouth daily., Disp: , Rfl:    Review  of Systems:   ROS Negative unless otherwise specified per HPI.  Vitals:   Vitals:   04/02/17 0749  BP: 118/72  Pulse: 60  Temp: 98.6 F (37 C)  TempSrc: Oral  SpO2: 96%  Weight: 228 lb (103.4 kg)  Height: '5\' 6"'  (1.676 m)     Body mass index is 36.8 kg/m.  Physical Exam:   Physical Exam  Constitutional: She appears well-developed. She is cooperative.  Non-toxic appearance. She does not have a sickly appearance. She does not appear ill. No distress.  HENT:  Head: Normocephalic and atraumatic.  Right Ear: External ear normal.  Left Ear: External ear normal.  Nose: Nose normal. Right sinus exhibits no maxillary sinus tenderness and no frontal sinus tenderness. Left sinus exhibits no maxillary sinus tenderness and no frontal sinus tenderness.  Mouth/Throat: Uvula is midline. No posterior oropharyngeal edema or posterior oropharyngeal erythema.  Cerumen impaction bilaterally, unable to visualize TM prior to ear lavage.  Eyes: Conjunctivae and lids are normal.  Neck: Trachea normal.  Cardiovascular: Normal rate, regular rhythm, S1 normal, S2 normal and normal heart sounds.  Pulmonary/Chest: Effort normal and breath sounds normal. She has no decreased breath sounds. She has no wheezes. She has no rhonchi. She has no rales.  Lymphadenopathy:    She has no cervical adenopathy.  Neurological: She is alert.  Skin: Skin is warm, dry and intact.  Psychiatric: She has a normal mood and affect. Her speech is normal and behavior is normal.  Nursing note and vitals reviewed.   Procedure: Cerumen Disimpaction Warm saline water applied and gentle ear lavage attempted on the right side however patient was not able to tolerate 2/2 dizziness. Left ear  wax removed with curette. After lying down for a few minutes, patient's dizziness resolved on own. The tympanic membrane was visible on the left but not on the right.  L auditory canal is normal.  The patient reported relief of symptoms after removal of cerumen.    Assessment and Plan:    Kourtnee was seen today for ear fullness.  Diagnoses and all orders for this visit:  Impacted cerumen of both ears  Patient was only able to tolerate L ear cerumen disimpaction with curette. Unable to tolerate R ear cerumen disimpaction 2/2 dizziness. Dizziness resolved shortly after procedure was stopped. She reports relief after L ear cleaned. She reports that she has seen an ENT in [facility] in the past and she will try to get an appointment with them for an ear lavage attempt by them, if not, she will reach out to [name] for a referral. Patient is agreeable to plan.  . Reviewed expectations re: course of current medical issues. . Discussed self-management of symptoms. . Outlined signs and symptoms indicating need for more acute intervention. . Patient verbalized understanding and all questions were answered. . See orders for this visit as documented in the electronic medical record. . Patient received an After-Visit Summary.  CMA or LPN served as scribe during this visit. History, Physical, and Plan performed by medical provider. Documentation and orders reviewed and attested to.  Inda Coke, PA-C

## 2017-04-02 NOTE — Patient Instructions (Signed)
It was great to meet you!  Follow-up if symptoms persist.

## 2017-04-09 DIAGNOSIS — M7712 Lateral epicondylitis, left elbow: Secondary | ICD-10-CM | POA: Diagnosis not present

## 2017-04-10 ENCOUNTER — Other Ambulatory Visit (INDEPENDENT_AMBULATORY_CARE_PROVIDER_SITE_OTHER): Payer: Self-pay | Admitting: Family Medicine

## 2017-04-10 ENCOUNTER — Encounter: Payer: Self-pay | Admitting: Family Medicine

## 2017-04-10 ENCOUNTER — Encounter (INDEPENDENT_AMBULATORY_CARE_PROVIDER_SITE_OTHER): Payer: Self-pay | Admitting: Family Medicine

## 2017-04-10 DIAGNOSIS — F3289 Other specified depressive episodes: Secondary | ICD-10-CM

## 2017-04-14 ENCOUNTER — Other Ambulatory Visit: Payer: Self-pay | Admitting: Surgical

## 2017-04-14 DIAGNOSIS — F3289 Other specified depressive episodes: Secondary | ICD-10-CM

## 2017-04-14 MED ORDER — BUPROPION HCL ER (SR) 150 MG PO TB12: 150.0000 mg | ORAL_TABLET | Freq: Every day | ORAL | 10 refills | Status: DC

## 2017-04-14 MED FILL — BUPROPION SR 150 MG TABLET: 150 | 30 days supply | Qty: 30 | Fill #0

## 2017-04-16 DIAGNOSIS — M7712 Lateral epicondylitis, left elbow: Secondary | ICD-10-CM | POA: Diagnosis not present

## 2017-04-17 ENCOUNTER — Ambulatory Visit (INDEPENDENT_AMBULATORY_CARE_PROVIDER_SITE_OTHER): Payer: 59 | Admitting: Family Medicine

## 2017-04-17 VITALS — BP 122/75 | HR 61 | Temp 99.1°F | Ht 66.0 in | Wt 223.0 lb

## 2017-04-17 DIAGNOSIS — E118 Type 2 diabetes mellitus with unspecified complications: Secondary | ICD-10-CM | POA: Diagnosis not present

## 2017-04-17 DIAGNOSIS — Z6836 Body mass index (BMI) 36.0-36.9, adult: Secondary | ICD-10-CM

## 2017-04-17 NOTE — Progress Notes (Signed)
Office: [phone]  /  Fax: [phone]   HPI:   Chief Complaint: OBESITY Alexandra Walker is here to discuss her progress with her obesity treatment plan. She is on the  follow the Category 2 plan and is following her eating plan approximately 90 % of the time. She states she is walking for 30 minutes 1 times per week. [name] continues to do well with weight loss, even over Thanksgiving. [name] sometimes skips breakfast and sometimes skips lunch. Her weight is 223 lb (101.2 kg) today and has had a weight loss of 2 pounds over a period of 5 weeks since her last visit. She has lost 11 lbs since starting treatment with [name].  Diabetes II [name] has a diagnosis of diabetes type II. She is on Metformin XR 750 currently and denies GI upset. [name] is not checking BGs at home and denies any hypoglycemic episodes. She has been working on intensive lifestyle modifications including diet, exercise, and weight loss to help control her blood glucose levels.  ALLERGIES: Allergies  Allergen Reactions  . Ace Inhibitors Other (See Comments)    coughing  . Oxycodone-Acetaminophen Nausea And Vomiting  . Percocet [Oxycodone-Acetaminophen] Nausea And Vomiting  . Vicodin [Hydrocodone-Acetaminophen] Nausea And Vomiting  . Victoza [Liraglutide] Other (See Comments)    pancreatitis    MEDICATIONS: Current Outpatient Medications on File Prior to Visit  Medication Sig Dispense Refill  . aspirin 325 MG tablet Take 325 mg by mouth daily.    . Blood Glucose Monitoring Suppl (FREESTYLE LITE) DEVI Check blood sugar 2-3 times per day.  Dx:E11.21 1 each 0  . buPROPion (WELLBUTRIN SR) 150 MG 12 hr tablet Take 1 tablet (150 mg total) by mouth daily. 30 tablet 10  . cholecalciferol (VITAMIN D) 1000 UNITS tablet Take 1,000 Units by mouth daily.    . cyclobenzaprine (FLEXERIL) 5 MG tablet Take 1 tablet (5 mg total) by mouth at bedtime as needed for muscle spasms. 30 tablet 2  . diclofenac sodium (VOLTAREN) 1 % GEL APPLY 2 GRAMS  TOPICALLY 2 TIMES DAILY AS NEEDED. NOTE: 2 GRAMS IS EQUIVALENT TO A NICKEL SIZED AMOUNT.    . DOCOSAHEXAENOIC ACID PO Take by mouth.    Marland Kitchen glucose blood (FREESTYLE LITE) test strip Check blood sugar 2-3 times per day.  Dx:E11.21. 100 each 12  . ibuprofen (ADVIL,MOTRIN) 200 MG tablet Take 200 mg by mouth every 6 (six) hours as needed.    . Lancets (FREESTYLE) lancets Check blood sugar 2-3 times per day.  Dx:E11.21 100 each 12  . loratadine (CLARITIN) 10 MG tablet Take by mouth.    . losartan (COZAAR) 50 MG tablet Take 1 tablet (50 mg total) by mouth daily. 30 tablet 5  . metFORMIN (GLUCOPHAGE XR) 750 MG 24 hr tablet Take two daily before breakfast. 180 tablet 2  . metFORMIN (GLUCOPHAGE) 500 MG tablet Take 1,000 mg by mouth 2 (two) times daily with a meal.    . metoprolol succinate (TOPROL-XL) 25 MG 24 hr tablet Take 1 tablet (25 mg total) by mouth daily. 90 tablet 2  . metoprolol succinate (TOPROL-XL) 50 MG 24 hr tablet Take 1 tablet (50 mg total) by mouth daily. Take with or immediately following a meal. 30 tablet 5  . Omega-3 Fatty Acids (FISH OIL PO) Take 1 tablet by mouth daily.    Marland Kitchen saccharomyces boulardii (FLORASTOR) 250 MG capsule Take 1 capsule (250 mg total) by mouth 2 (two) times daily. 60 capsule 0  . simvastatin (ZOCOR) 40 MG tablet  Take 1 tablet (40 mg total) by mouth every evening. 30 tablet 5  . Turmeric 450 MG CAPS Take 900 mg by mouth daily.     No current facility-administered medications on file prior to visit.     PAST MEDICAL HISTORY: Past Medical History:  Diagnosis Date  . 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 Arthritis   . Back pain   . Chronic depressive disorder   . Chronic edema    bilateral ankles  . Circadian rhythm sleep disorder   . Diabetes ([place])   . Fatty liver   . Ganglion of knee    left  . GERD (gastroesophageal reflux disease)    rare  . Glucose intolerance (impaired glucose tolerance)   . Hepatic steatosis   . HLA B27 (HLA B27 positive)   . Hx of  pancreatitis   . Hyperlipidemia   . Hypertension   . IBS (irritable bowel syndrome)   . IBS (irritable bowel syndrome)   . Ischemic colitis ([place])   . Joint pain   . Kyphosis   . Lateral epicondylitis of right elbow   . Leg edema   . Lymphadenopathy   . Migraine headache    history of  . Osteoarthritis   . PAC (premature atrial contraction)    PAC, PJC, managed well with BB  . Paronychia   . Paroxysmal atrial fibrillation ([place])    a. 01/2016 - brief episode during adm for severe sepsis.  Marland Kitchen PMS (premenstrual syndrome)   . PONV (postoperative nausea and vomiting)   . Pyelonephritis 01/2016  . Sacroiliitis ([place])   . Seasonal allergies   . Sepsis ([place]) 01/2016  . Sleep hypopnea   . SVT (supraventricular tachycardia) ([place])    a. 01/2016 - SVT, wide complex tachycardia, and brief afib in setting of severe sepsis.  . Trigger ring finger of left hand   . Vitamin D deficiency     PAST SURGICAL HISTORY: Past Surgical History:  Procedure Laterality Date  . BLADDER SURGERY    . BREAST BIOPSY Left   . CERVICAL CONE BIOPSY    . CHOLECYSTECTOMY N/A 10/26/2014   Procedure: LAPAROSCOPIC CHOLECYSTECTOMY;  Surgeon: Coralie Keens, MD;  Location: [facility];  Service: General;  Laterality: N/A;  . COLONOSCOPY W/ BIOPSIES  multiple  . DIAGNOSTIC LAPAROSCOPY    . DILATION AND CURETTAGE OF UTERUS  07,11  . ESOPHAGOGASTRODUODENOSCOPY  multiple  . KNEE ARTHROSCOPY Left 11/2008  . LAPAROSCOPIC VAGINAL HYSTERECTOMY WITH SALPINGO OOPHORECTOMY Bilateral 10/03/2015   Procedure: LAPAROSCOPIC ASSISTED VAGINAL HYSTERECTOMY WITH SALPINGECTOMY;  Surgeon: Eldred Manges, MD;  Location: [facility];  Service: Gynecology;  Laterality: Bilateral;  . LEFT FINGER SURGERY    . MASS EXCISION Left 12/24/2012   Procedure: LEFT EXCISION OF PALMAR MASS X TWO;  Surgeon: Roseanne Kaufman, MD;  Location: [facility];  Service: Orthopedics;  Laterality: Left;    SOCIAL HISTORY: Social History    Tobacco Use  . Smoking status: Never Smoker  . Smokeless tobacco: Never Used  Substance Use Topics  . Alcohol use: Yes    Alcohol/week: 0.0 oz    Comment: social  . Drug use: No    FAMILY HISTORY: Family History  Problem Relation Age of Onset  . Heart attack Father   . Heart disease Father   . Coronary artery disease Mother   . Hypertension Mother   . Heart disease Mother   . Heart attack Mother   . Diabetes Mother   . Hyperlipidemia  Mother   . Kidney disease Mother   . Depression Mother   . Sleep apnea Mother   . Obesity Mother   . Hypertension Sister   . Hypertension Brother   . Colon cancer Neg Hx     ROS: Review of Systems  Constitutional: Positive for weight loss.  Gastrointestinal: Negative for nausea and vomiting.  Endo/Heme/Allergies:       Negative hypoglycemia    PHYSICAL EXAM: Blood pressure 122/75, pulse 61, temperature 99.1 F (37.3 C), temperature source Oral, height _0  (1.676 m), weight 223 lb (101.2 kg), last menstrual period 05/20/2008, SpO2 98 %. Body mass index is 35.99 kg/m. Physical Exam  Constitutional: She is oriented to person, place, and time. She appears well-developed and well-nourished.  Cardiovascular: Normal rate.  Pulmonary/Chest: Effort normal.  Musculoskeletal: Normal range of motion.  Neurological: She is oriented to person, place, and time.  Skin: Skin is warm and dry.  Psychiatric: She has a normal mood and affect. Her behavior is normal.  Vitals reviewed.   RECENT LABS AND TESTS: BMET    Component Value Date/Time   NA 140 03/13/2017 0813   K 4.5 03/13/2017 0813   CL 103 03/13/2017 0813   CO2 25 03/13/2017 0813   GLUCOSE 112 (H) 03/13/2017 0813   GLUCOSE 112 (H) 02/02/2016 0357   BUN 22 03/13/2017 0813   CREATININE 0.88 03/13/2017 0813   CREATININE 0.69 01/11/2016 1537   CALCIUM 9.3 03/13/2017 0813   GFRNONAA 72 03/13/2017 0813   GFRAA 83 03/13/2017 0813   Lab Results  Component Value Date   HGBA1C 5.5  03/13/2017   HGBA1C 5.6 10/28/2016   Lab Results  Component Value Date   INSULIN 12.5 03/13/2017   INSULIN 10.0 10/28/2016   CBC    Component Value Date/Time   WBC 6.6 11/06/2016 1623   RBC 4.99 11/06/2016 1623   HGB 13.7 11/06/2016 1623   HGB 13.7 10/28/2016 1031   HCT 41.9 11/06/2016 1623   HCT 42.6 10/28/2016 1031   PLT 179 11/06/2016 1623   MCV 84.0 11/06/2016 1623   MCV 84 10/28/2016 1031   MCH 27.5 11/06/2016 1623   MCHC 32.7 11/06/2016 1623   RDW 13.2 11/06/2016 1623   RDW 13.0 10/28/2016 1031   LYMPHSABS 594 (L) 11/06/2016 1623   LYMPHSABS 1.2 10/28/2016 1031   MONOABS 330 11/06/2016 1623   EOSABS 0 (L) 11/06/2016 1623   EOSABS 0.2 10/28/2016 1031   BASOSABS 0 11/06/2016 1623   BASOSABS 0.0 10/28/2016 1031   Iron/TIBC/Ferritin/ %Sat No results found for: IRON, TIBC, FERRITIN, IRONPCTSAT Lipid Panel     Component Value Date/Time   CHOL 144 03/13/2017 0813   TRIG 125 03/13/2017 0813   HDL 41 03/13/2017 0813   LDLCALC 78 03/13/2017 0813   Hepatic Function Panel     Component Value Date/Time   PROT 6.3 03/13/2017 0813   ALBUMIN 4.0 03/13/2017 0813   AST 10 03/13/2017 0813   ALT 12 03/13/2017 0813   ALKPHOS 77 03/13/2017 0813   BILITOT 0.3 03/13/2017 0813      Component Value Date/Time   TSH 2.190 10/28/2016 1031    ASSESSMENT AND PLAN: Controlled type 2 diabetes mellitus with complication, without long-term current use of insulin (HCC)  Class 2 severe obesity with serious comorbidity and body mass index (BMI) of 36.0 to 36.9 in adult, unspecified obesity type ([place])  PLAN:  Diabetes II Delana has been given extensive diabetes education by myself today including ideal fasting and  post-prandial blood glucose readings, individual ideal Hgb A1c goals and hypoglycemia prevention. We discussed the importance of good blood sugar control to decrease the likelihood of diabetic complications such as nephropathy, neuropathy, limb loss, blindness, coronary  artery disease, and death. We discussed the importance of intensive lifestyle modification including diet, exercise and weight loss as the first line treatment for diabetes. Mechille agrees to continue with diet, exercise and weight loss. She agrees to continue metformin and will follow up at the agreed upon time.  We spent > than 50% of the 15 minute visit on the counseling as documented in the note.  Obesity Trang is currently in the action stage of change. As such, her goal is to continue with weight loss efforts She has agreed to follow the Category 2 plan Prestyn has been instructed to work up to a goal of 150 minutes of combined cardio and strengthening exercise per week for weight loss and overall health benefits. We discussed the following Behavioral Modification Strategies today: dealing with family or coworker sabotage, holiday eating strategies and travel eating strategies  Chelsae has agreed to follow up with our clinic in 3 weeks. She was informed of the importance of frequent follow up visits to maximize her success with intensive lifestyle modifications for her multiple health conditions.  I, Doreene Nest, am acting as transcriptionist for Dennard Nip, MD  I have reviewed the above documentation for accuracy and completeness, and I agree with the above. -Dennard Nip, MD    OBESITY BEHAVIORAL INTERVENTION VISIT  Today's visit was # 8 out of 22.  Starting weight: 234 lbs Starting date: 10/28/16 Today's weight : 223 lbs Today's date: 04/17/2017 Total lbs lost to date: 11 (Patients must lose 7 lbs in the first 6 months to continue with counseling)   ASK: We discussed the diagnosis of obesity with [name] today and Lelan Pons agreed to give [name] permission to discuss obesity behavioral modification therapy today.  ASSESS: Zaliyah has the diagnosis of obesity and her BMI today is 36.01 Charae is in the action stage of change   ADVISE: Idara was educated on the multiple  health risks of obesity as well as the benefit of weight loss to improve her health. She was advised of the need for long term treatment and the importance of lifestyle modifications.  AGREE: Multiple dietary modification options and treatment options were discussed and  Yaileen agreed to follow the Category 2 plan We discussed the following Behavioral Modification Strategies today: dealing with family or coworker sabotage, holiday eating strategies and travel eating strategies

## 2017-04-25 DIAGNOSIS — M7712 Lateral epicondylitis, left elbow: Secondary | ICD-10-CM | POA: Diagnosis not present

## 2017-04-30 DIAGNOSIS — M25562 Pain in left knee: Secondary | ICD-10-CM | POA: Diagnosis not present

## 2017-04-30 DIAGNOSIS — M25571 Pain in right ankle and joints of right foot: Secondary | ICD-10-CM | POA: Diagnosis not present

## 2017-05-07 ENCOUNTER — Ambulatory Visit (INDEPENDENT_AMBULATORY_CARE_PROVIDER_SITE_OTHER): Payer: 59 | Admitting: Family Medicine

## 2017-05-07 VITALS — BP 133/78 | HR 56 | Temp 98.7°F | Ht 66.0 in | Wt 233.0 lb

## 2017-05-07 DIAGNOSIS — Z6836 Body mass index (BMI) 36.0-36.9, adult: Secondary | ICD-10-CM

## 2017-05-07 DIAGNOSIS — R001 Bradycardia, unspecified: Secondary | ICD-10-CM | POA: Diagnosis not present

## 2017-05-07 DIAGNOSIS — M7712 Lateral epicondylitis, left elbow: Secondary | ICD-10-CM | POA: Diagnosis not present

## 2017-05-07 NOTE — Progress Notes (Signed)
Office: [phone]  /  Fax: [phone]   HPI:   Chief Complaint: Alexandra Alexandra Walker is here to discuss her progress with her Alexandra treatment plan. She is on the Category 2 plan and is following her eating plan approximately 90 % of the time. She states she is exercising 0 minutes 0 times per week. Alexandra Walker has done well maintaining weight over the last few weeks. She has had increased temptations and some increased eating out. Her weight is 233 lb (105.7 kg) today and has maintained weight over a period of 3 weeks since her last visit. She has lost 1 lb since starting treatment with [name].  Alexandra Walker decreased the dose of her beta blocker from 50 to 25, but heart rate is still in the 50's. She denies any syncopal episodes and she is still able to exercise.no chest pain  ALLERGIES: Allergies  Allergen Reactions  . Ace Inhibitors Other (See Comments)    coughing  . Oxycodone-Acetaminophen Nausea And Vomiting  . Percocet [Oxycodone-Acetaminophen] Nausea And Vomiting  . Vicodin [Hydrocodone-Acetaminophen] Nausea And Vomiting  . Victoza [Liraglutide] Other (See Comments)    pancreatitis    MEDICATIONS: Current Outpatient Medications on File Prior to Visit  Medication Sig Dispense Refill  . aspirin 325 MG tablet Take 325 mg by mouth daily.    . Blood Glucose Monitoring Suppl (FREESTYLE LITE) DEVI Check blood sugar 2-3 times per day.  Dx:E11.21 1 each 0  . buPROPion (WELLBUTRIN SR) 150 MG 12 hr tablet Take 1 tablet (150 mg total) by mouth daily. 30 tablet 10  . cholecalciferol (VITAMIN D) 1000 UNITS tablet Take 1,000 Units by mouth daily.    . cyclobenzaprine (FLEXERIL) 5 MG tablet Take 1 tablet (5 mg total) by mouth at bedtime as needed for muscle spasms. 30 tablet 2  . diclofenac sodium (VOLTAREN) 1 % GEL APPLY 2 GRAMS TOPICALLY 2 TIMES DAILY AS NEEDED. NOTE: 2 GRAMS IS EQUIVALENT TO A NICKEL SIZED AMOUNT.    . DOCOSAHEXAENOIC ACID PO Take by mouth.    Marland Kitchen glucose blood (FREESTYLE  LITE) test strip Check blood sugar 2-3 times per day.  Dx:E11.21. 100 each 12  . ibuprofen (ADVIL,MOTRIN) 200 MG tablet Take 200 mg by mouth every 6 (six) hours as needed.    . Lancets (FREESTYLE) lancets Check blood sugar 2-3 times per day.  Dx:E11.21 100 each 12  . loratadine (CLARITIN) 10 MG tablet Take by mouth.    . losartan (COZAAR) 50 MG tablet Take 1 tablet (50 mg total) by mouth daily. 30 tablet 5  . metFORMIN (GLUCOPHAGE XR) 750 MG 24 hr tablet Take two daily before breakfast. 180 tablet 2  . metFORMIN (GLUCOPHAGE) 500 MG tablet Take 1,000 mg by mouth 2 (two) times daily with a meal.    . metoprolol succinate (TOPROL-XL) 25 MG 24 hr tablet Take 1 tablet (25 mg total) by mouth daily. 90 tablet 2  . Omega-3 Fatty Acids (FISH OIL PO) Take 1 tablet by mouth daily.    Marland Kitchen saccharomyces boulardii (FLORASTOR) 250 MG capsule Take 1 capsule (250 mg total) by mouth 2 (two) times daily. 60 capsule 0  . simvastatin (ZOCOR) 40 MG tablet Take 1 tablet (40 mg total) by mouth every evening. 30 tablet 5  . Turmeric 450 MG CAPS Take 900 mg by mouth daily.     No current facility-administered medications on file prior to visit.     PAST MEDICAL HISTORY: Past Medical History:  Diagnosis Date  . 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 Arthritis   . Back pain   . Chronic depressive disorder   . Chronic edema    bilateral ankles  . Circadian rhythm sleep disorder   . Diabetes ([place])   . Fatty liver   . Ganglion of knee    left  . GERD (gastroesophageal reflux disease)    rare  . Glucose intolerance (impaired glucose tolerance)   . Hepatic steatosis   . HLA B27 (HLA B27 positive)   . Hx of pancreatitis   . Hyperlipidemia   . Hypertension   . IBS (irritable bowel syndrome)   . IBS (irritable bowel syndrome)   . Ischemic colitis ([place])   . Joint pain   . Kyphosis   . Lateral epicondylitis of right elbow   . Leg edema   . Lymphadenopathy   . Migraine headache    history of  . Osteoarthritis     . PAC (premature atrial contraction)    PAC, PJC, managed well with BB  . Paronychia   . Paroxysmal atrial fibrillation ([place])    a. 01/2016 - brief episode during adm for severe sepsis.  Marland Kitchen PMS (premenstrual syndrome)   . PONV (postoperative nausea and vomiting)   . Pyelonephritis 01/2016  . Sacroiliitis ([place])   . Seasonal allergies   . Sepsis ([place]) 01/2016  . Sleep hypopnea   . SVT (supraventricular tachycardia) ([place])    a. 01/2016 - SVT, wide complex tachycardia, and brief afib in setting of severe sepsis.  . Trigger ring finger of left hand   . Vitamin D deficiency     PAST SURGICAL HISTORY: Past Surgical History:  Procedure Laterality Date  . BLADDER SURGERY    . BREAST BIOPSY Left   . CERVICAL CONE BIOPSY    . CHOLECYSTECTOMY N/A 10/26/2014   Procedure: LAPAROSCOPIC CHOLECYSTECTOMY;  Surgeon: Coralie Keens, MD;  Location: [facility];  Service: General;  Laterality: N/A;  . COLONOSCOPY W/ BIOPSIES  multiple  . DIAGNOSTIC LAPAROSCOPY    . DILATION AND CURETTAGE OF UTERUS  07,11  . ESOPHAGOGASTRODUODENOSCOPY  multiple  . KNEE ARTHROSCOPY Left 11/2008  . LAPAROSCOPIC VAGINAL HYSTERECTOMY WITH SALPINGO OOPHORECTOMY Bilateral 10/03/2015   Procedure: LAPAROSCOPIC ASSISTED VAGINAL HYSTERECTOMY WITH SALPINGECTOMY;  Surgeon: Eldred Manges, MD;  Location: [facility];  Service: Gynecology;  Laterality: Bilateral;  . LEFT FINGER SURGERY    . MASS EXCISION Left 12/24/2012   Procedure: LEFT EXCISION OF PALMAR MASS X TWO;  Surgeon: Roseanne Kaufman, MD;  Location: [facility];  Service: Orthopedics;  Laterality: Left;    SOCIAL HISTORY: Social History   Tobacco Use  . Smoking status: Never Smoker  . Smokeless tobacco: Never Used  Substance Use Topics  . Alcohol use: Yes    Alcohol/week: 0.0 oz    Comment: social  . Drug use: No    FAMILY HISTORY: Family History  Problem Relation Age of Onset  . Heart attack Father   . Heart disease Father   .  Coronary artery disease Mother   . Hypertension Mother   . Heart disease Mother   . Heart attack Mother   . Diabetes Mother   . Hyperlipidemia Mother   . Kidney disease Mother   . Depression Mother   . Sleep apnea Mother   . Alexandra Mother   . Hypertension Sister   . Hypertension Brother   . Colon cancer Neg Hx     ROS: Review of Systems  Constitutional: Negative for weight loss.  Neurological:       Negative syncope    PHYSICAL EXAM: Blood pressure 133/78, pulse (!) 56, temperature 98.7 F (37.1 C), temperature source Oral, height '5\' 6"'  (1.676 m), weight 233 lb (105.7 kg), last menstrual period 05/20/2008, SpO2 98 %. Body mass index is 37.61 kg/m. Physical Exam  Constitutional: She is oriented to person, place, and time. She appears well-developed and well-nourished.  Cardiovascular: Alexandra present.  Pulmonary/Chest: Effort normal.  Musculoskeletal: Normal range of motion.  Neurological: She is oriented to person, place, and time.  Skin: Skin is warm and dry.  Psychiatric: She has a normal mood and affect. Her behavior is normal.  Vitals reviewed.   RECENT LABS AND TESTS: BMET    Component Value Date/Time   NA 140 03/13/2017 0813   K 4.5 03/13/2017 0813   CL 103 03/13/2017 0813   CO2 25 03/13/2017 0813   GLUCOSE 112 (H) 03/13/2017 0813   GLUCOSE 112 (H) 02/02/2016 0357   BUN 22 03/13/2017 0813   CREATININE 0.88 03/13/2017 0813   CREATININE 0.69 01/11/2016 1537   CALCIUM 9.3 03/13/2017 0813   GFRNONAA 72 03/13/2017 0813   GFRAA 83 03/13/2017 0813   Lab Results  Component Value Date   HGBA1C 5.5 03/13/2017   HGBA1C 5.6 10/28/2016   Lab Results  Component Value Date   INSULIN 12.5 03/13/2017   INSULIN 10.0 10/28/2016   CBC    Component Value Date/Time   WBC 6.6 11/06/2016 1623   RBC 4.99 11/06/2016 1623   HGB 13.7 11/06/2016 1623   HGB 13.7 10/28/2016 1031   HCT 41.9 11/06/2016 1623   HCT 42.6 10/28/2016 1031   PLT 179 11/06/2016 1623    MCV 84.0 11/06/2016 1623   MCV 84 10/28/2016 1031   MCH 27.5 11/06/2016 1623   MCHC 32.7 11/06/2016 1623   RDW 13.2 11/06/2016 1623   RDW 13.0 10/28/2016 1031   LYMPHSABS 594 (L) 11/06/2016 1623   LYMPHSABS 1.2 10/28/2016 1031   MONOABS 330 11/06/2016 1623   EOSABS 0 (L) 11/06/2016 1623   EOSABS 0.2 10/28/2016 1031   BASOSABS 0 11/06/2016 1623   BASOSABS 0.0 10/28/2016 1031   Iron/TIBC/Ferritin/ %Sat No results found for: IRON, TIBC, FERRITIN, IRONPCTSAT Lipid Panel     Component Value Date/Time   CHOL 144 03/13/2017 0813   TRIG 125 03/13/2017 0813   HDL 41 03/13/2017 0813   LDLCALC 78 03/13/2017 0813   Hepatic Function Panel     Component Value Date/Time   PROT 6.3 03/13/2017 0813   ALBUMIN 4.0 03/13/2017 0813   AST 10 03/13/2017 0813   ALT 12 03/13/2017 0813   ALKPHOS 77 03/13/2017 0813   BILITOT 0.3 03/13/2017 0813      Component Value Date/Time   TSH 2.190 10/28/2016 1031    ASSESSMENT AND PLAN: Alexandra  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 Alexandra Walker will watch for signs of lightheadedness and will follow up as directed.  We spent > than 50% of the 15 minute visit on the counseling as documented in the note.  Alexandra Walker is currently in the action stage of change. As such, her goal is to continue with weight loss efforts She has agreed to follow the Category 2 plan Alexandra Walker has been instructed to work up to a goal of 150 minutes  of combined cardio and strengthening exercise per week for weight loss and overall health benefits. We discussed the following Behavioral Modification Strategies today: increasing lean protein intake and holiday eating strategies   Goal is to maintain over the holidays  Alexandra Walker has agreed to follow up with our clinic in 3 weeks.  She was informed of the importance of frequent follow up visits to maximize her success with intensive lifestyle modifications for her multiple health conditions.  I, Doreene Nest, am acting as transcriptionist for Dennard Nip, MD  I have reviewed the above documentation for accuracy and completeness, and I agree with the above. -Dennard Nip, MD    Alexandra BEHAVIORAL INTERVENTION VISIT  Today's visit was # 9 out of 22.  Starting weight: 234 lbs Starting date: 10/28/16 Today's weight : 233 lbs Today's date: 05/07/2017 Total lbs lost to date: 1 (Patients must lose 7 lbs in the first 6 months to continue with counseling)   ASK: We discussed the diagnosis of Alexandra with Alexandra Walker today and Alexandra Walker agreed to give [name] permission to discuss Alexandra behavioral modification therapy today.  ASSESS: Alexandra Walker has the diagnosis of Alexandra and her BMI today is 24.63 Alexandra Walker is in the action stage of change   ADVISE: Alexandra Walker was educated on the multiple health risks of Alexandra as well as the benefit of weight loss to improve her health. She was advised of the need for long term treatment and the importance of lifestyle modifications.  AGREE: Multiple dietary modification options and treatment options were discussed and  Alexandra Walker agreed to follow the Category 2 plan We discussed the following Behavioral Modification Strategies today: increasing lean protein intake and holiday eating strategies

## 2017-05-13 ENCOUNTER — Telehealth: Payer: 59 | Admitting: Nurse Practitioner

## 2017-05-13 DIAGNOSIS — N3000 Acute cystitis without hematuria: Secondary | ICD-10-CM

## 2017-05-13 MED ORDER — SULFAMETHOXAZOLE-TRIMETHOPRIM 800-160 MG PO TABS: 1.0000 | ORAL_TABLET | Freq: Two times a day (BID) | ORAL | 0 refills | Status: DC

## 2017-05-13 NOTE — Progress Notes (Signed)
We are sorry that you are not feeling well.  Here is how we plan to help!  Based on what you shared with me it looks like you most likely have a simple urinary tract infection.  A UTI (Urinary Tract Infection) is a bacterial infection of the bladder.  Most cases of urinary tract infections are simple to treat but a key part of your care is to encourage you to drink plenty of fluids and watch your symptoms carefully.  I have prescribed  Bactrim ds 1 po bid #14 .  Your symptoms should gradually improve. Call us if the burning in your urine worsens, you develop worsening fever, back pain or pelvic pain or if your symptoms do not resolve after completing the antibiotic.  Urinary tract infections can be prevented by drinking plenty of water to keep your body hydrated.  Also be sure when you wipe, wipe from front to back and don't hold it in!  If possible, empty your bladder every 4 hours.  Your e-visit answers were reviewed by a board certified advanced clinical practitioner to complete your personal care plan.  Depending on the condition, your plan could have included both over the counter or prescription medications.  If there is a problem please reply  once you have received a response from your provider.  Your safety is important to [name].  If you have drug allergies check your prescription carefully.    You can use MyChart to ask questions about today's visit, request a non-urgent call back, or ask for a work or school excuse for 24 hours related to this e-Visit. If it has been greater than 24 hours you will need to follow up with your provider, or enter a new e-Visit to address those concerns.   You will get an e-mail in the next two days asking about your experience.  I hope that your e-visit has been valuable and will speed your recovery. Thank you for using e-visits.

## 2017-05-14 ENCOUNTER — Telehealth: Payer: Self-pay | Admitting: Family Medicine

## 2017-05-14 ENCOUNTER — Other Ambulatory Visit (INDEPENDENT_AMBULATORY_CARE_PROVIDER_SITE_OTHER): Payer: 59

## 2017-05-14 DIAGNOSIS — R300 Dysuria: Secondary | ICD-10-CM

## 2017-05-14 DIAGNOSIS — M7712 Lateral epicondylitis, left elbow: Secondary | ICD-10-CM | POA: Diagnosis not present

## 2017-05-14 LAB — URINALYSIS, ROUTINE W REFLEX MICROSCOPIC
Bilirubin Urine: NEGATIVE
Hgb urine dipstick: NEGATIVE
Ketones, ur: NEGATIVE
Leukocytes, UA: NEGATIVE
Nitrite: NEGATIVE
Specific Gravity, Urine: <=1.005 — AB (ref 1.000–1.030)
Total Protein, Urine: NEGATIVE
Urine Glucose: NEGATIVE
Urobilinogen, UA: 0.2 (ref 0.0–1.0)
pH: 6.5 (ref 5.0–8.0)

## 2017-05-14 MED FILL — BUPROPION SR 150 MG TABLET: 150 | 30 days supply | Qty: 30 | Fill #1

## 2017-05-14 NOTE — Telephone Encounter (Signed)
Patient called app made order put in patient informed.

## 2017-05-14 NOTE — Telephone Encounter (Signed)
Yes - already receiving treatment so possible it will be clear. Okay UA with micro and UCx. No visit needed.

## 2017-05-14 NOTE — Telephone Encounter (Signed)
Please advise 

## 2017-05-14 NOTE — Telephone Encounter (Signed)
Copied from Home. Topic: General - Other >> May 14, 2017  8:39 AM Lolita Rieger, RMA wrote: Reason for CRM: pt called and would like a called and advice on if she should come in and leave a urine sample to ensure that she is on the correct antibiotic Please call pt at [phone]

## 2017-05-15 LAB — URINE CULTURE
MICRO NUMBER:: 81448493
Result:: NO GROWTH
SPECIMEN QUALITY:: ADEQUATE

## 2017-06-03 ENCOUNTER — Ambulatory Visit (INDEPENDENT_AMBULATORY_CARE_PROVIDER_SITE_OTHER): Payer: 59 | Admitting: Family Medicine

## 2017-06-03 VITALS — BP 143/83 | HR 61 | Temp 98.3°F | Ht 66.0 in | Wt 223.0 lb

## 2017-06-03 DIAGNOSIS — Z9189 Other specified personal risk factors, not elsewhere classified: Secondary | ICD-10-CM

## 2017-06-03 DIAGNOSIS — F3289 Other specified depressive episodes: Secondary | ICD-10-CM | POA: Diagnosis not present

## 2017-06-03 DIAGNOSIS — Z6836 Body mass index (BMI) 36.0-36.9, adult: Secondary | ICD-10-CM | POA: Diagnosis not present

## 2017-06-03 DIAGNOSIS — E119 Type 2 diabetes mellitus without complications: Secondary | ICD-10-CM

## 2017-06-03 MED ORDER — BUPROPION HCL ER (SR) 150 MG PO TB12: 150.0000 mg | ORAL_TABLET | Freq: Every day | ORAL | 10 refills | Status: DC

## 2017-06-03 NOTE — Progress Notes (Signed)
Office: [phone]  /  Fax: [phone]   HPI:   Chief Complaint: OBESITY Alexandra Walker is here to discuss her progress with her obesity treatment plan. She is on the Category 2 plan and is following her eating plan approximately 90 % of the time. She states she is doing strength training and row machine for 10 minutes 3 to 4 times per week. Alexandra Walker did well maintaining weight over the holidays. She is exercising, per her OT, but is not up to her previous level. She is not following her category 2 plan closely. She is mostly controlling her portions and making smarter food choices. Her weight is 223 lb (101.2 kg) today and has maintained weight over a period of 4 weeks since her last visit. She has lost 1 lb since starting treatment with [name].  Diabetes II Alexandra Walker has a diagnosis of diabetes type II. Alexandra Walker is on Metformin and her last A1c was at 5.6. She denies any hypoglycemic episodes or polyphagia. She has been working on intensive lifestyle modifications including diet, exercise, and weight loss to help control her blood glucose levels.  At risk for cardiovascular disease Alexandra Walker is at a higher than average risk for cardiovascular disease due to obesity and diabetes. She currently denies any chest pain.  Depression with emotional eating behaviors Alexandra Walker is on wellbutrin and is not sure if it is helping. It isn't worsening anxiety or irritability. Alexandra Walker struggles with emotional eating and using food for comfort to the extent that it is negatively impacting her health. She often snacks when she is not hungry. Alexandra Walker sometimes feels she is out of control and then feels guilty that she made poor food choices. She has been working on behavior modification techniques to help reduce her emotional eating and has been somewhat successful. She shows no sign of suicidal or homicidal ideations.  Depression screen PHQ 2/9 10/28/2016  Decreased Interest 1  Down, Depressed, Hopeless 3  PHQ - 2 Score 4  Altered  sleeping 2  Tired, decreased energy 2  Change in appetite 2  Feeling bad or failure about yourself  2  Trouble concentrating 3  Moving slowly or fidgety/restless 0  Suicidal thoughts 0  PHQ-9 Score 15      ALLERGIES: Allergies  Allergen Reactions  . Ace Inhibitors Other (See Comments)    coughing  . Oxycodone-Acetaminophen Nausea And Vomiting  . Percocet [Oxycodone-Acetaminophen] Nausea And Vomiting  . Vicodin [Hydrocodone-Acetaminophen] Nausea And Vomiting  . Victoza [Liraglutide] Other (See Comments)    pancreatitis    MEDICATIONS: Current Outpatient Medications on File Prior to Visit  Medication Sig Dispense Refill  . aspirin 325 MG tablet Take 325 mg by mouth daily.    . Blood Glucose Monitoring Suppl (FREESTYLE LITE) DEVI Check blood sugar 2-3 times per day.  Dx:E11.21 1 each 0  . buPROPion (WELLBUTRIN SR) 150 MG 12 hr tablet Take 1 tablet (150 mg total) by mouth daily. 30 tablet 10  . cholecalciferol (VITAMIN D) 1000 UNITS tablet Take 1,000 Units by mouth daily.    . cyclobenzaprine (FLEXERIL) 5 MG tablet Take 1 tablet (5 mg total) by mouth at bedtime as needed for muscle spasms. 30 tablet 2  . diclofenac sodium (VOLTAREN) 1 % GEL APPLY 2 GRAMS TOPICALLY 2 TIMES DAILY AS NEEDED. NOTE: 2 GRAMS IS EQUIVALENT TO A NICKEL SIZED AMOUNT.    Marland Kitchen glucose blood (FREESTYLE LITE) test strip Check blood sugar 2-3 times per day.  Dx:E11.21. 100 each 12  . ibuprofen (ADVIL,MOTRIN) 200  MG tablet Take 200 mg by mouth every 6 (six) hours as needed.    . Lancets (FREESTYLE) lancets Check blood sugar 2-3 times per day.  Dx:E11.21 100 each 12  . loratadine (CLARITIN) 10 MG tablet Take by mouth.    . losartan (COZAAR) 50 MG tablet Take 1 tablet (50 mg total) by mouth daily. 30 tablet 5  . metFORMIN (GLUCOPHAGE XR) 750 MG 24 hr tablet Take two daily before breakfast. 180 tablet 2  . metoprolol succinate (TOPROL-XL) 25 MG 24 hr tablet Take 1 tablet (25 mg total) by mouth daily. 90 tablet 2  .  Omega-3 Fatty Acids (FISH OIL PO) Take 1 tablet by mouth daily.    Marland Kitchen saccharomyces boulardii (FLORASTOR) 250 MG capsule Take 1 capsule (250 mg total) by mouth 2 (two) times daily. 60 capsule 0  . simvastatin (ZOCOR) 40 MG tablet Take 1 tablet (40 mg total) by mouth every evening. 30 tablet 5  . sulfamethoxazole-trimethoprim (BACTRIM DS) 800-160 MG tablet Take 1 tablet by mouth 2 (two) times daily. 14 tablet 0  . Turmeric 450 MG CAPS Take 900 mg by mouth daily.     No current facility-administered medications on file prior to visit.     PAST MEDICAL HISTORY: Past Medical History:  Diagnosis Date  . 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 Arthritis   . Back pain   . Chronic depressive disorder   . Chronic edema    bilateral ankles  . Circadian rhythm sleep disorder   . Diabetes ([place])   . Fatty liver   . Ganglion of knee    left  . GERD (gastroesophageal reflux disease)    rare  . Glucose intolerance (impaired glucose tolerance)   . Hepatic steatosis   . HLA B27 (HLA B27 positive)   . Hx of pancreatitis   . Hyperlipidemia   . Hypertension   . IBS (irritable bowel syndrome)   . IBS (irritable bowel syndrome)   . Ischemic colitis ([place])   . Joint pain   . Kyphosis   . Lateral epicondylitis of right elbow   . Leg edema   . Lymphadenopathy   . Migraine headache    history of  . Osteoarthritis   . PAC (premature atrial contraction)    PAC, PJC, managed well with BB  . Paronychia   . Paroxysmal atrial fibrillation ([place])    a. 01/2016 - brief episode during adm for severe sepsis.  Marland Kitchen PMS (premenstrual syndrome)   . PONV (postoperative nausea and vomiting)   . Pyelonephritis 01/2016  . Sacroiliitis ([place])   . Seasonal allergies   . Sepsis ([place]) 01/2016  . Sleep hypopnea   . SVT (supraventricular tachycardia) ([place])    a. 01/2016 - SVT, wide complex tachycardia, and brief afib in setting of severe sepsis.  . Trigger ring finger of left hand   . Vitamin D deficiency     PAST  SURGICAL HISTORY: Past Surgical History:  Procedure Laterality Date  . BLADDER SURGERY    . BREAST BIOPSY Left   . CERVICAL CONE BIOPSY    . CHOLECYSTECTOMY N/A 10/26/2014   Procedure: LAPAROSCOPIC CHOLECYSTECTOMY;  Surgeon: Coralie Keens, MD;  Location: [facility];  Service: General;  Laterality: N/A;  . COLONOSCOPY W/ BIOPSIES  multiple  . DIAGNOSTIC LAPAROSCOPY    . DILATION AND CURETTAGE OF UTERUS  07,11  . ESOPHAGOGASTRODUODENOSCOPY  multiple  . KNEE ARTHROSCOPY Left 11/2008  . LAPAROSCOPIC VAGINAL HYSTERECTOMY WITH SALPINGO OOPHORECTOMY Bilateral 10/03/2015  Procedure: LAPAROSCOPIC ASSISTED VAGINAL HYSTERECTOMY WITH SALPINGECTOMY;  Surgeon: Eldred Manges, MD;  Location: [facility];  Service: Gynecology;  Laterality: Bilateral;  . LEFT FINGER SURGERY    . MASS EXCISION Left 12/24/2012   Procedure: LEFT EXCISION OF PALMAR MASS X TWO;  Surgeon: Roseanne Kaufman, MD;  Location: [facility];  Service: Orthopedics;  Laterality: Left;    SOCIAL HISTORY: Social History   Tobacco Use  . Smoking status: Never Smoker  . Smokeless tobacco: Never Used  Substance Use Topics  . Alcohol use: Yes    Alcohol/week: 0.0 oz    Comment: social  . Drug use: No    FAMILY HISTORY: Family History  Problem Relation Age of Onset  . Heart attack Father   . Heart disease Father   . Coronary artery disease Mother   . Hypertension Mother   . Heart disease Mother   . Heart attack Mother   . Diabetes Mother   . Hyperlipidemia Mother   . Kidney disease Mother   . Depression Mother   . Sleep apnea Mother   . Obesity Mother   . Hypertension Sister   . Hypertension Brother   . Colon cancer Neg Hx     ROS: Review of Systems  Constitutional: Negative for weight loss.  Cardiovascular: Negative for chest pain.  Endo/Heme/Allergies:       Negative hypoglycemia Negative polyphagia  Psychiatric/Behavioral: Positive for depression. Negative for suicidal ideas. The  patient is nervous/anxious (anxiety).        Positive irritability    PHYSICAL EXAM: Blood pressure (!) 143/83, pulse 61, temperature 98.3 F (36.8 C), temperature source Oral, height '5\' 6"'  (1.676 m), weight 223 lb (101.2 kg), last menstrual period 05/20/2008, SpO2 96 %. Body mass index is 35.99 kg/m. Physical Exam  Constitutional: She is oriented to person, place, and time. She appears well-developed and well-nourished.  Cardiovascular: Normal rate.  Pulmonary/Chest: Effort normal.  Musculoskeletal: Normal range of motion.  Neurological: She is oriented to person, place, and time.  Skin: Skin is warm and dry.  Psychiatric: She has a normal mood and affect. Her behavior is normal.  Vitals reviewed.   RECENT LABS AND TESTS: BMET    Component Value Date/Time   NA 140 03/13/2017 0813   K 4.5 03/13/2017 0813   CL 103 03/13/2017 0813   CO2 25 03/13/2017 0813   GLUCOSE 112 (H) 03/13/2017 0813   GLUCOSE 112 (H) 02/02/2016 0357   BUN 22 03/13/2017 0813   CREATININE 0.88 03/13/2017 0813   CREATININE 0.69 01/11/2016 1537   CALCIUM 9.3 03/13/2017 0813   GFRNONAA 72 03/13/2017 0813   GFRAA 83 03/13/2017 0813   Lab Results  Component Value Date   HGBA1C 5.5 03/13/2017   HGBA1C 5.6 10/28/2016   Lab Results  Component Value Date   INSULIN 12.5 03/13/2017   INSULIN 10.0 10/28/2016   CBC    Component Value Date/Time   WBC 6.6 11/06/2016 1623   RBC 4.99 11/06/2016 1623   HGB 13.7 11/06/2016 1623   HGB 13.7 10/28/2016 1031   HCT 41.9 11/06/2016 1623   HCT 42.6 10/28/2016 1031   PLT 179 11/06/2016 1623   MCV 84.0 11/06/2016 1623   MCV 84 10/28/2016 1031   MCH 27.5 11/06/2016 1623   MCHC 32.7 11/06/2016 1623   RDW 13.2 11/06/2016 1623   RDW 13.0 10/28/2016 1031   LYMPHSABS 594 (L) 11/06/2016 1623   LYMPHSABS 1.2 10/28/2016 1031   MONOABS 330 11/06/2016 1623  EOSABS 0 (L) 11/06/2016 1623   EOSABS 0.2 10/28/2016 1031   BASOSABS 0 11/06/2016 1623   BASOSABS 0.0  10/28/2016 1031   Iron/TIBC/Ferritin/ %Sat No results found for: IRON, TIBC, FERRITIN, IRONPCTSAT Lipid Panel     Component Value Date/Time   CHOL 144 03/13/2017 0813   TRIG 125 03/13/2017 0813   HDL 41 03/13/2017 0813   LDLCALC 78 03/13/2017 0813   Hepatic Function Panel     Component Value Date/Time   PROT 6.3 03/13/2017 0813   ALBUMIN 4.0 03/13/2017 0813   AST 10 03/13/2017 0813   ALT 12 03/13/2017 0813   ALKPHOS 77 03/13/2017 0813   BILITOT 0.3 03/13/2017 0813      Component Value Date/Time   TSH 2.190 10/28/2016 1031    ASSESSMENT AND PLAN: Type 2 diabetes mellitus without complication, without long-term current use of insulin (HCC)  Other depression - with emotional eating - Plan: buPROPion (WELLBUTRIN SR) 150 MG 12 hr tablet  At risk for heart disease  Class 2 severe obesity with serious comorbidity and body mass index (BMI) of 36.0 to 36.9 in adult, unspecified obesity type ([place])  PLAN:  Diabetes II [place]y has been given extensive diabetes education by myself today including ideal fasting and post-prandial blood glucose readings, individual ideal Hgb A1c goals and hypoglycemia prevention. We discussed the importance of good blood sugar control to decrease the likelihood of diabetic complications such as nephropathy, neuropathy, limb loss, blindness, coronary artery disease, and death. We discussed the importance of intensive lifestyle modification including diet, exercise and weight loss as the first line treatment for diabetes. Alexandra Walker agrees to continue her metformin and decrease sugar in her diet. Alexandra Walker agrees to continue with weight loss and follow up at the agreed upon time.  Cardiovascular risk counseling Alexandra Walker was given extended (15 minutes) coronary artery disease prevention counseling today. She is 62 y.o. female and has risk factors for heart disease including obesity and diabetes. We discussed intensive lifestyle modifications today with an emphasis on  specific weight loss instructions and strategies. Pt was also informed of the importance of increasing exercise and decreasing saturated fats to help prevent heart disease.  Depression with Emotional Eating Behaviors We discussed behavior modification techniques today to help Alexandra Walker deal with her emotional eating and depression. Alexandra Walker agrees to continue to work on [employer]. She has agreed to continue Wellbutrin SR 150 mg qd #30 with no refills and follow up as directed.  Obesity Alexandra Walker is currently in the action stage of change. As such, her goal is to continue with weight loss efforts She has agreed to portion control better and make smarter food choices, such as increase vegetables and decrease simple carbohydrates  Alexandra Walker has been instructed to work up to a goal of 150 minutes of combined cardio and strengthening exercise per week for weight loss and overall health benefits. We discussed the following Behavioral Modification Strategies today: increasing lean protein intake and decreasing simple carbohydrates   Alexandra Walker has agreed to follow up with our clinic in 4 weeks. She was informed of the importance of frequent follow up visits to maximize her success with intensive lifestyle modifications for her multiple health conditions.   OBESITY BEHAVIORAL INTERVENTION VISIT  Today's visit was # 10 out of 22.  Starting weight: 234 lbs Starting date: 10/28/16 Today's weight : 233 lbs Today's date: 06/03/2017 Total lbs lost to date: 1 (Patients must lose 7 lbs in the first 6 months to continue with counseling)  ASK: We discussed the diagnosis of obesity with Alexandra Walker today and Alexandra Walker agreed to give [name] permission to discuss obesity behavioral modification therapy today.  ASSESS: Alexandra Walker has the diagnosis of obesity and her BMI today is 25.63 Alexandra Walker is in the action stage of change   ADVISE: Alexandra Walker was educated on the multiple health risks of obesity as well as the benefit  of weight loss to improve her health. She was advised of the need for long term treatment and the importance of lifestyle modifications.  AGREE: Multiple dietary modification options and treatment options were discussed and  Alexandra Walker agreed to the above obesity treatment plan.  I, Doreene Nest, am acting as transcriptionist for Dennard Nip, MD  I have reviewed the above documentation for accuracy and completeness, and I agree with the above. -Dennard Nip, MD

## 2017-06-04 ENCOUNTER — Encounter: Payer: Self-pay | Admitting: Neurology

## 2017-06-04 ENCOUNTER — Encounter: Payer: Self-pay | Admitting: Family Medicine

## 2017-06-09 MED FILL — BUPROPION SR 150 MG TABLET: 150 | 30 days supply | Qty: 30 | Fill #0

## 2017-06-10 MED FILL — METOPROLOL SUCC ER 25 MG TA: 25 | 90 days supply | Qty: 90 | Fill #1

## 2017-06-10 MED FILL — METFORMIN HCL ER 750 MG TAB: 750 | 90 days supply | Qty: 180 | Fill #1

## 2017-06-11 ENCOUNTER — Encounter (HOSPITAL_BASED_OUTPATIENT_CLINIC_OR_DEPARTMENT_OTHER): Payer: Self-pay

## 2017-06-11 ENCOUNTER — Emergency Department (HOSPITAL_BASED_OUTPATIENT_CLINIC_OR_DEPARTMENT_OTHER)
Admission: EM | Admit: 2017-06-11 | Discharge: 2017-06-11 | Disposition: A | Discharge status: Home / Self Care | Payer: 59 | Attending: Physician Assistant | Admitting: Physician Assistant

## 2017-06-11 ENCOUNTER — Other Ambulatory Visit: Payer: Self-pay

## 2017-06-11 ENCOUNTER — Emergency Department (HOSPITAL_BASED_OUTPATIENT_CLINIC_OR_DEPARTMENT_OTHER): Payer: 59

## 2017-06-11 DIAGNOSIS — Y929 Unspecified place or not applicable: Secondary | ICD-10-CM | POA: Diagnosis not present

## 2017-06-11 DIAGNOSIS — W010XXA Fall on same level from slipping, tripping and stumbling without subsequent striking against object, initial encounter: Secondary | ICD-10-CM | POA: Insufficient documentation

## 2017-06-11 DIAGNOSIS — Z7982 Long term (current) use of aspirin: Secondary | ICD-10-CM | POA: Insufficient documentation

## 2017-06-11 DIAGNOSIS — S8991XA Unspecified injury of right lower leg, initial encounter: Secondary | ICD-10-CM | POA: Diagnosis not present

## 2017-06-11 DIAGNOSIS — W19XXXA Unspecified fall, initial encounter: Secondary | ICD-10-CM

## 2017-06-11 DIAGNOSIS — I10 Essential (primary) hypertension: Secondary | ICD-10-CM | POA: Insufficient documentation

## 2017-06-11 DIAGNOSIS — E119 Type 2 diabetes mellitus without complications: Secondary | ICD-10-CM | POA: Diagnosis not present

## 2017-06-11 DIAGNOSIS — Z79899 Other long term (current) drug therapy: Secondary | ICD-10-CM | POA: Insufficient documentation

## 2017-06-11 DIAGNOSIS — Y999 Unspecified external cause status: Secondary | ICD-10-CM | POA: Insufficient documentation

## 2017-06-11 DIAGNOSIS — M25561 Pain in right knee: Secondary | ICD-10-CM | POA: Diagnosis not present

## 2017-06-11 DIAGNOSIS — Z7984 Long term (current) use of oral hypoglycemic drugs: Secondary | ICD-10-CM | POA: Insufficient documentation

## 2017-06-11 DIAGNOSIS — Y939 Activity, unspecified: Secondary | ICD-10-CM | POA: Diagnosis not present

## 2017-06-11 DIAGNOSIS — S80911A Unspecified superficial injury of right knee, initial encounter: Secondary | ICD-10-CM | POA: Insufficient documentation

## 2017-06-11 NOTE — ED Provider Notes (Signed)
[facility] EMERGENCY DEPARTMENT Provider Note   CSN: [id number] Arrival date & time: 06/11/17  1930     History   Chief Complaint Chief Complaint  Patient presents with  . Fall    HPI Alexandra Walker is a 62 y.o. female.  HPI   Alexandra Walker is a 62 y.o. female, with a history of DM and HTN, presenting to the ED with right knee pain following a mechanical fall that occurred around 4 PM today.  Patient tripped and landed on her right knee.  Endorses 4/10, throbbing pain to the anterior right knee and increased pain with ambulation.  Accompanied by swelling.  Took ibuprofen prior to arrival with improvement.  Denies numbness, weakness, head injury, neck/back pain, or any other complaints.    Past Medical History:  Diagnosis Date  . 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 Arthritis   . Back pain   . Chronic depressive disorder   . Chronic edema    bilateral ankles  . Circadian rhythm sleep disorder   . Diabetes ([place])   . Fatty liver   . Ganglion of knee    left  . GERD (gastroesophageal reflux disease)    rare  . Glucose intolerance (impaired glucose tolerance)   . Hepatic steatosis   . HLA B27 (HLA B27 positive)   . Hx of pancreatitis   . Hyperlipidemia   . Hypertension   . IBS (irritable bowel syndrome)   . IBS (irritable bowel syndrome)   . Ischemic colitis ([place])   . Joint pain   . Kyphosis   . Lateral epicondylitis of right elbow   . Leg edema   . Lymphadenopathy   . Migraine headache    history of  . Osteoarthritis   . PAC (premature atrial contraction)    PAC, PJC, managed well with BB  . Paronychia   . Paroxysmal atrial fibrillation ([place])    a. 01/2016 - brief episode during adm for severe sepsis.  Marland Kitchen PMS (premenstrual syndrome)   . PONV (postoperative nausea and vomiting)   . Pyelonephritis 01/2016  . Sacroiliitis ([place])   . Seasonal allergies   . Sepsis ([place]) 01/2016  . Sleep hypopnea   . SVT (supraventricular tachycardia) ([place])    a.  01/2016 - SVT, wide complex tachycardia, and brief afib in setting of severe sepsis.  . Trigger ring finger of left hand   . Vitamin D deficiency     Patient Active Problem List   Diagnosis Date Noted  . Recurrent circadian rhythm sleep disorder, shift work type 03/31/2017  . Morbid obesity ([place]) 03/31/2017  . Cervical intraepithelial neoplasia grade 1 03/17/2017  . Female bladder prolapse 03/17/2017  . Irritable bowel syndrome 03/17/2017  . Class 2 obesity with serious comorbidity and body mass index (BMI) of 35.0 to 35.9 in adult 12/30/2016  . Circadian rhythm sleep disorder, shift work type 09/26/2016  . Fatty infiltration of liver 09/26/2016  . OSA on CPAP 09/26/2016  . PAF (paroxysmal atrial fibrillation) ([place]) 02/01/2016  . SVT (supraventricular tachycardia) ([place]) 01/31/2016  . Essential hypertension 01/31/2016  . Postmenopausal bleeding 10/03/2015  . LGSIL (low grade squamous intraepithelial dysplasia) 08/05/2015  . PMB (postmenopausal bleeding) 08/05/2015  . Dupuytren contracture 05/31/2015  . Elevated LFTs 05/31/2015  . Kyphosis, acquired 05/31/2015  . Premenstrual tension syndrome 05/31/2015  . Tenosynovitis of right foot 05/31/2015  . Allergic rhinitis 05/10/2015  . Cardiac dysrhythmia 05/10/2015  . Chronic depressive disorder 05/10/2015  . Edema 05/10/2015  .  Gastroesophageal reflux disease without esophagitis 05/10/2015  . Hyperlipidemia associated with type 2 diabetes mellitus ([place]) 05/10/2015  . Microalbuminuria 05/10/2015  . Migraine headache without aura 05/10/2015  . Mixed hyperlipidemia 05/10/2015  . Vitamin D deficiency 05/10/2015  . PAC (premature atrial contraction) 09/19/2011    Past Surgical History:  Procedure Laterality Date  . ABDOMINAL HYSTERECTOMY    . BLADDER SURGERY    . BREAST BIOPSY Left   . CERVICAL CONE BIOPSY    . CHOLECYSTECTOMY N/A 10/26/2014   Procedure: LAPAROSCOPIC CHOLECYSTECTOMY;  Surgeon: Coralie Keens, MD;  Location: [facility];  Service: General;  Laterality: N/A;  . COLONOSCOPY W/ BIOPSIES  multiple  . DIAGNOSTIC LAPAROSCOPY    . DILATION AND CURETTAGE OF UTERUS  07,11  . ESOPHAGOGASTRODUODENOSCOPY  multiple  . KNEE ARTHROSCOPY Left 11/2008  . LAPAROSCOPIC VAGINAL HYSTERECTOMY WITH SALPINGO OOPHORECTOMY Bilateral 10/03/2015   Procedure: LAPAROSCOPIC ASSISTED VAGINAL HYSTERECTOMY WITH SALPINGECTOMY;  Surgeon: Eldred Manges, MD;  Location: [facility];  Service: Gynecology;  Laterality: Bilateral;  . LEFT FINGER SURGERY    . MASS EXCISION Left 12/24/2012   Procedure: LEFT EXCISION OF PALMAR MASS X TWO;  Surgeon: Roseanne Kaufman, MD;  Location: [facility];  Service: Orthopedics;  Laterality: Left;    OB History    Gravida Para Term Preterm AB Living   1       1     SAB TAB Ectopic Multiple Live Births   1               Home Medications    Prior to Admission medications   Medication Sig Start Date End Date Taking? Authorizing Provider  aspirin 325 MG tablet Take 325 mg by mouth daily.    [provider]  Blood Glucose Monitoring Suppl (FREESTYLE LITE) DEVI Check blood sugar 2-3 times per day.  Dx:E11.21 10/21/16   Shelda Pal, DO  buPROPion (WELLBUTRIN SR) 150 MG 12 hr tablet Take 1 tablet (150 mg total) by mouth daily. 06/03/17   Dennard Nip D, MD  cholecalciferol (VITAMIN D) 1000 UNITS tablet Take 1,000 Units by mouth daily.    [provider]  cyclobenzaprine (FLEXERIL) 5 MG tablet Take 1 tablet (5 mg total) by mouth at bedtime as needed for muscle spasms. 07/25/14   Hudnall, Sharyn Lull, MD  diclofenac sodium (VOLTAREN) 1 % GEL APPLY 2 GRAMS TOPICALLY 2 TIMES DAILY AS NEEDED. NOTE: 2 GRAMS IS EQUIVALENT TO A NICKEL SIZED AMOUNT. 05/30/16   [provider]  glucose blood (FREESTYLE LITE) test strip Check blood sugar 2-3 times per day.  Dx:E11.21. 10/21/16   Shelda Pal, DO  ibuprofen (ADVIL,MOTRIN) 200 MG tablet Take 200 mg by mouth  every 6 (six) hours as needed.    [provider]  Lancets (FREESTYLE) lancets Check blood sugar 2-3 times per day.  Dx:E11.21 10/21/16   Shelda Pal, DO  loratadine (CLARITIN) 10 MG tablet Take by mouth.    [provider]  losartan (COZAAR) 50 MG tablet Take 1 tablet (50 mg total) by mouth daily. 12/04/16   Shelda Pal, DO  metFORMIN (GLUCOPHAGE XR) 750 MG 24 hr tablet Take two daily before breakfast. 03/17/17   Briscoe Deutscher, DO  metoprolol succinate (TOPROL-XL) 25 MG 24 hr tablet Take 1 tablet (25 mg total) by mouth daily. 03/17/17   Briscoe Deutscher, DO  Omega-3 Fatty Acids (FISH OIL PO) Take 1 tablet by mouth daily.    [provider]  saccharomyces boulardii (FLORASTOR) 250 MG capsule Take 1 capsule (250 mg total) by mouth 2 (two) times daily. 02/02/16   Regalado, Belkys A, MD  simvastatin (ZOCOR) 40 MG tablet Take 1 tablet (40 mg total) by mouth every evening. 12/04/16   Shelda Pal, DO  sulfamethoxazole-trimethoprim (BACTRIM DS) 800-160 MG tablet Take 1 tablet by mouth 2 (two) times daily. 05/13/17   Hassell Done, Mary-Margaret, FNP  Turmeric 450 MG CAPS Take 900 mg by mouth daily.    [provider]    Family History Family History  Problem Relation Age of Onset  . Heart attack Father   . Heart disease Father   . Coronary artery disease Mother   . Hypertension Mother   . Heart disease Mother   . Heart attack Mother   . Diabetes Mother   . Hyperlipidemia Mother   . Kidney disease Mother   . Depression Mother   . Sleep apnea Mother   . Obesity Mother   . Hypertension Sister   . Hypertension Brother   . Colon cancer Neg Hx     Social History Social History   Tobacco Use  . Smoking status: Never Smoker  . Smokeless tobacco: Never Used  Substance Use Topics  . Alcohol use: Yes    Alcohol/week: 0.0 oz    Comment: social  . Drug use: No     Allergies   Ace inhibitors; Oxycodone-acetaminophen; Percocet  [oxycodone-acetaminophen]; Vicodin [hydrocodone-acetaminophen]; and Victoza [liraglutide]   Review of Systems Review of Systems  Musculoskeletal: Positive for arthralgias and joint swelling. Negative for back pain and neck pain.  Neurological: Negative for weakness and numbness.     Physical Exam Updated Vital Signs BP (!) 140/94 (BP Location: Left Arm)   Pulse 80   Temp 98.7 F (37.1 C) (Oral)   Resp 16   Ht '5\' 6"'  (1.676 m)   Wt 103.6 kg (228 lb 6.3 oz)   LMP 05/20/2008   SpO2 99%   BMI 36.86 kg/m   Physical Exam  Constitutional: She appears well-developed and well-nourished. No distress.  HENT:  Head: Normocephalic and atraumatic.  Eyes: Conjunctivae are normal.  Neck: Neck supple.  Cardiovascular: Normal rate, regular rhythm and intact distal pulses.  Pulmonary/Chest: Effort normal.  Musculoskeletal: She exhibits edema and tenderness. She exhibits no deformity.  Tenderness, edema, and ecchymosis to anterior, proximal tibia without noted instability, deformity, laxity, or crepitus.  Patella appears to be in anatomical position. Full range of motion in the right knee.   Neurological: She is alert.  No noted sensory deficits. Strength 5/5 with flexion and extension of the right knee and ankle. Patient ambulatory without assistance.  Skin: Skin is warm and dry. Capillary refill takes less than 2 seconds. She is not diaphoretic. No pallor.  Psychiatric: She has a normal mood and affect. Her behavior is normal.  Nursing note and vitals reviewed.    ED Treatments / Results  Labs (all labs ordered are listed, but only abnormal results are displayed) Labs Reviewed - No data to display  EKG  EKG Interpretation None       Radiology Dg Knee Complete 4 Views Right  Result Date: 06/11/2017 CLINICAL DATA:  Fall with knee pain. EXAM: RIGHT KNEE - COMPLETE 4+ VIEW COMPARISON:  Right knee radiograph 04/01/2010 FINDINGS: No evidence of fracture, dislocation, or joint  effusion. No evidence of arthropathy or other focal bone abnormality. Soft tissues are unremarkable. IMPRESSION: Normal right knee. Electronically Signed   By: Cletus Gash.D.  On: 06/11/2017 20:03    Procedures Procedures (including critical care time)  Medications Ordered in ED Medications - No data to display   Initial Impression / Assessment and Plan / ED Course  I have reviewed the triage vital signs and the nursing notes.  Pertinent labs & imaging results that were available during my care of the patient were reviewed by me and considered in my medical decision making (see chart for details).     Patient presents with a right knee injury.  Right lower extremity neurovascularly intact.  No acute abnormality on x-ray.  Knee sleeve, crutches, and orthopedic follow-up. The patient was given instructions for home care as well as return precautions. Patient voices understanding of these instructions, accepts the plan, and is comfortable with discharge.   Final Clinical Impressions(s) / ED Diagnoses   Final diagnoses:  Fall, initial encounter  Injury of right knee, initial encounter    ED Discharge Orders    None       Layla Maw 06/12/17 0150    Macarthur Critchley, MD 06/12/17 1622

## 2017-06-11 NOTE — Discharge Instructions (Signed)
You have been seen today for a knee injury. There were no acute abnormalities on the x-rays, including no sign of fracture or dislocation, however, there could be injuries to the soft tissues, such as the ligaments or tendons that are not seen on xrays. There could also be what are called occult fractures that are small fractures not seen on xray. Pain: Take 600 mg of ibuprofen every 6 hours or 440 mg (over the counter dose) to 500 mg (prescription dose) of naproxen every 12 hours for the next 3 days. After this time, these medications may be used as needed for pain. Take these medications with food to avoid upset stomach. Choose only one of these medications, do not take them together.  Tylenol: Should you continue to have additional pain while taking the ibuprofen or naproxen, you may add in tylenol as needed. Your daily total maximum amount of tylenol from all sources should be limited to 4000mg /day for persons without liver problems, or 2000mg /day for those with liver problems. Ice: May apply ice to the area over the next 24 hours for 15 minutes at a time to reduce swelling. Elevation: Keep the extremity elevated as often as possible to reduce pain and inflammation. Support: Wear the knee sleeve for support and comfort. Wear this until pain resolves. You will be weight-bearing as tolerated, which means you can slowly start to put weight on the extremity and increase amount and frequency as pain allows. Exercises: Start by performing these exercises a few times a week, increasing the frequency until you are performing them twice daily.  Follow up: If symptoms are improving, you may follow up with your primary care provider for any continued management. If symptoms are not improving, you may follow up with the orthopedic specialist.

## 2017-06-11 NOTE — ED Triage Notes (Signed)
Pt tripped on mat at movies approx 4pm-c/o pain, swelling to right knee-NAD-limping gait-ice pack in place upon arrival

## 2017-06-12 ENCOUNTER — Encounter: Payer: Self-pay | Admitting: Family Medicine

## 2017-06-17 ENCOUNTER — Ambulatory Visit (INDEPENDENT_AMBULATORY_CARE_PROVIDER_SITE_OTHER): Payer: 59 | Admitting: Family Medicine

## 2017-06-17 ENCOUNTER — Encounter: Payer: Self-pay | Admitting: Family Medicine

## 2017-06-17 VITALS — BP 128/84 | HR 57 | Temp 98.3°F | Ht 66.0 in | Wt 226.8 lb

## 2017-06-17 DIAGNOSIS — E119 Type 2 diabetes mellitus without complications: Secondary | ICD-10-CM | POA: Diagnosis not present

## 2017-06-17 DIAGNOSIS — I87303 Chronic venous hypertension (idiopathic) without complications of bilateral lower extremity: Secondary | ICD-10-CM | POA: Diagnosis not present

## 2017-06-17 DIAGNOSIS — N390 Urinary tract infection, site not specified: Secondary | ICD-10-CM

## 2017-06-17 MED ORDER — SCD SOFT SLEEVES/KNEE LENGTH MISC: 1 refills | Status: DC

## 2017-06-17 MED ORDER — SULFAMETHOXAZOLE-TRIMETHOPRIM 400-80 MG PO TABS: 1.0000 | ORAL_TABLET | Freq: Two times a day (BID) | ORAL | 0 refills | Status: DC

## 2017-06-17 NOTE — Progress Notes (Addendum)
Alexandra Walker is a 62 y.o. female is here for follow up.  History of Present Illness:   HPI: See Assessment and Plan section for Problem Based Charting of issues discussed today.   Current symptoms: no polyuria or polydipsia, no chest pain, dyspnea or TIA's, no numbness, tingling or pain in extremities.  Taking medication compliantly without noted sided effects [x]   YES  []   NO  Episodes of hypoglycemia? []   YES  [x]   NO Maintaining a diabetic diet? [x]   YES  []   NO Trying to exercise on a regular basis? [x]   YES  []   NO   On ACE inhibitor or angiotensin II receptor blocker? [x]   YES  []   NO On Aspirin? [x]   YES  []   NO  Lab Results  Component Value Date   HGBA1C 5.5 03/13/2017    No results found for: Derl Barrow  Lab Results  Component Value Date   CHOL 144 03/13/2017   HDL 41 03/13/2017   LDLCALC 78 03/13/2017   TRIG 125 03/13/2017     Wt Readings from Last 3 Encounters:  06/17/17 226 lb 12.8 oz (102.9 kg)  06/11/17 228 lb 6.3 oz (103.6 kg)  06/03/17 223 lb (101.2 kg)   BP Readings from Last 3 Encounters:  06/17/17 128/84  06/11/17 (!) 140/94  06/03/17 (!) 143/83   Lab Results  Component Value Date   CREATININE 0.88 03/13/2017   The 10-year ASCVD risk score Mikey Bussing DC Jr., et al., 2013) is: 7.2%   Values used to calculate the score:     Age: 74 years     Sex: Female     Is Non-Hispanic African American: No     Diabetic: Yes     Tobacco smoker: No     Systolic Blood Pressure: 540 mmHg     Is BP treated: Yes     HDL Cholesterol: 41 mg/dL     Total Cholesterol: 144 mg/dL  Health Maintenance Due  Topic Date Due  . OPHTHALMOLOGY EXAM  01/18/2017  . TETANUS/TDAP  06/07/2017   Depression screen PHQ 2/9 10/28/2016  Decreased Interest 1  Down, Depressed, Hopeless 3  PHQ - 2 Score 4  Altered sleeping 2  Tired, decreased energy 2  Change in appetite 2  Feeling bad or failure about yourself  2  Trouble concentrating 3  Moving slowly or  fidgety/restless 0  Suicidal thoughts 0  PHQ-9 Score 15   PMHx, SurgHx, SocialHx, FamHx, Medications, and Allergies were reviewed in the Visit Navigator and updated as appropriate.   Patient Active Problem List   Diagnosis Date Noted  . Recurrent circadian rhythm sleep disorder, shift work type 03/31/2017  . Morbid obesity ([place]) 03/31/2017  . Cervical intraepithelial neoplasia grade 1 03/17/2017  . Female bladder prolapse 03/17/2017  . Irritable bowel syndrome 03/17/2017  . Class 2 obesity with serious comorbidity and body mass index (BMI) of 35.0 to 35.9 in adult 12/30/2016  . Circadian rhythm sleep disorder, shift work type 09/26/2016  . Fatty infiltration of liver 09/26/2016  . OSA on CPAP 09/26/2016  . PAF (paroxysmal atrial fibrillation) ([place]) 02/01/2016  . SVT (supraventricular tachycardia) ([place]) 01/31/2016  . Essential hypertension 01/31/2016  . Postmenopausal bleeding 10/03/2015  . LGSIL (low grade squamous intraepithelial dysplasia) 08/05/2015  . PMB (postmenopausal bleeding) 08/05/2015  . Dupuytren contracture 05/31/2015  . Elevated LFTs 05/31/2015  . Kyphosis, acquired 05/31/2015  . Premenstrual tension syndrome 05/31/2015  . Tenosynovitis of right foot 05/31/2015  . Allergic  rhinitis 05/10/2015  . Cardiac dysrhythmia 05/10/2015  . Chronic depressive disorder 05/10/2015  . Edema 05/10/2015  . Gastroesophageal reflux disease without esophagitis 05/10/2015  . Hyperlipidemia associated with type 2 diabetes mellitus ([place]) 05/10/2015  . Microalbuminuria 05/10/2015  . Migraine headache without aura 05/10/2015  . Mixed hyperlipidemia 05/10/2015  . Vitamin D deficiency 05/10/2015  . PAC (premature atrial contraction) 09/19/2011   Social History   Tobacco Use  . Smoking status: Never Smoker  . Smokeless tobacco: Never Used  Substance Use Topics  . Alcohol use: Yes    Alcohol/week: 0.0 oz    Comment: social  . Drug use: No   Current Medications and Allergies:   .   aspirin 325 MG tablet, Take 325 mg by mouth daily., Disp: , Rfl:  .  buPROPion (WELLBUTRIN SR) 150 MG 12 hr tablet, Take 1 tablet (150 mg total) by mouth daily., Disp: 30 tablet, Rfl: 10 .  cholecalciferol (VITAMIN D) 1000 UNITS tablet, Take 1,000 Units by mouth daily., Disp: , Rfl:  .  cyclobenzaprine (FLEXERIL) 5 MG tablet, Take 1 tablet (5 mg total) by mouth at bedtime as needed for muscle spasms., Disp: 30 tablet, Rfl: 2 .  diclofenac sodium (VOLTAREN) 1 % GEL, APPLY 2 GRAMS TOPICALLY 2 TIMES DAILY AS NEEDED.  Marland Kitchen  loratadine (CLARITIN) 10 MG tablet, Take by mouth., Disp: , Rfl:  .  losartan (COZAAR) 50 MG tablet, Take 1 tablet (50 mg total) by mouth daily., Disp: 30 tablet, Rfl: 5 .  metFORMIN (GLUCOPHAGE XR) 750 MG 24 hr tablet, Take two daily before breakfast., Disp: 180 tablet, Rfl: 2 .  metoprolol succinate (TOPROL-XL) 25 MG 24 hr tablet, Take 1 tablet (25 mg total) by mouth daily., Disp: 90 tablet, Rfl: 2 .  Omega-3 Fatty Acids (FISH OIL PO), Take 1 tablet by mouth daily., Disp: , Rfl:  .  saccharomyces boulardii (FLORASTOR) 250 MG capsule, Take 1 capsule (250 mg total) by mouth 2 (two) times daily., Disp: 60 capsule, Rfl: 0 .  simvastatin (ZOCOR) 40 MG tablet, Take 1 tablet (40 mg total) by mouth every evening., Disp: 30 tablet, Rfl: 5 .  Turmeric 450 MG CAPS, Take 900 mg by mouth daily., Disp: , Rfl:   Allergies  Allergen Reactions  . Ace Inhibitors Other (See Comments)    coughing  . Oxycodone-Acetaminophen Nausea And Vomiting  . Vicodin [Hydrocodone-Acetaminophen] Nausea And Vomiting  . Victoza [Liraglutide] Other (See Comments)    pancreatitis   Review of Systems   Pertinent items are noted in the HPI. Otherwise, ROS is negative.  Vitals:   Vitals:   06/17/17 1049  BP: 128/84  Pulse: (!) 57  Temp: 98.3 F (36.8 C)  TempSrc: Oral  SpO2: 98%  Weight: 226 lb 12.8 oz (102.9 kg)  Height: 5\' 6"  (1.676 m)     Body mass index is 36.61 kg/m.   Physical Exam:    Physical Exam  Constitutional: She appears well-nourished.  HENT:  Head: Normocephalic and atraumatic.  Eyes: EOM are normal. Pupils are equal, round, and reactive to light.  Neck: Normal range of motion. Neck supple.  Cardiovascular: Normal rate, regular rhythm, normal heart sounds and intact distal pulses.  Pulmonary/Chest: Effort normal.  Abdominal: Soft.  Skin: Skin is warm.  Psychiatric: She has a normal mood and affect. Her behavior is normal.  Nursing note and vitals reviewed.   Diabetic Foot Exam - Simple   Simple Foot Form Diabetic Foot exam was performed with the following findings:  Yes 06/20/2017  7:54 AM  Visual Inspection No deformities, no ulcerations, no other skin breakdown bilaterally:  Yes Sensation Testing Intact to touch and monofilament testing bilaterally:  Yes Pulse Check Posterior Tibialis and Dorsalis pulse intact bilaterally:  Yes Comments     Assessment and Plan:   Aireona was seen today for follow-up.  Diagnoses and all orders for this visit:  Type 2 diabetes mellitus without complication, without long-term current use of insulin ([place]) Comments: Well controlled.  No signs of complications, medication side effects, or red flags.  Continue current regimen.    Stasis edema of both lower extremities Comments: DME ordered per request.  Orders: -     Misc. Devices (SCD SOFT SLEEVES/KNEE LENGTH) MISC; To be used at home.  Morbid obesity ([place]) Comments: Followed by [facility].   Chronic UTI Comments: Recent UTI. History of frequent UTIs. Provided safety net Rx. Okay for patient to drop off urine sample prior to starting in the future.  Orders: -     sulfamethoxazole-trimethoprim (BACTRIM) 400-80 MG tablet; Take 1 tablet by mouth 2 (two) times daily.   . Reviewed expectations re: course of current medical issues. . Discussed self-management of symptoms. . Outlined signs and symptoms indicating need for more acute intervention. . Patient verbalized  understanding and all questions were answered. Marland Kitchen Health Maintenance issues including appropriate healthy diet, exercise, and smoking avoidance were discussed with patient. . See orders for this visit as documented in the electronic medical record. . Patient received an After Visit Summary.  Briscoe Deutscher, DO [facility] 06/20/2017  Future Appointments  Date Time Provider [place]  07/01/2017  1:30 PM Waldon Merl, [place] [facility] None  12/16/2017  1:00 PM Briscoe Deutscher, DO [facility]  04/01/2018 10:30 AM [place], Asencion Partridge, MD [id number] None

## 2017-06-25 DIAGNOSIS — M238X1 Other internal derangements of right knee: Secondary | ICD-10-CM | POA: Diagnosis not present

## 2017-06-25 DIAGNOSIS — M222X2 Patellofemoral disorders, left knee: Secondary | ICD-10-CM | POA: Diagnosis not present

## 2017-06-25 DIAGNOSIS — M7041 Prepatellar bursitis, right knee: Secondary | ICD-10-CM | POA: Diagnosis not present

## 2017-06-25 DIAGNOSIS — M238X2 Other internal derangements of left knee: Secondary | ICD-10-CM | POA: Diagnosis not present

## 2017-07-01 ENCOUNTER — Ambulatory Visit (INDEPENDENT_AMBULATORY_CARE_PROVIDER_SITE_OTHER): Payer: 59 | Admitting: Physician Assistant

## 2017-07-01 VITALS — BP 109/70 | HR 58 | Temp 98.4°F | Ht 66.0 in | Wt 224.0 lb

## 2017-07-01 DIAGNOSIS — Z6836 Body mass index (BMI) 36.0-36.9, adult: Secondary | ICD-10-CM

## 2017-07-01 DIAGNOSIS — E119 Type 2 diabetes mellitus without complications: Secondary | ICD-10-CM | POA: Diagnosis not present

## 2017-07-01 NOTE — Progress Notes (Signed)
Office: [phone]  /  Fax: [phone]   HPI:   Chief Complaint: OBESITY Alexandra Walker is here to discuss her progress with her obesity treatment plan. She is on the Category 2 plan and is following her eating plan approximately 80 % of the time. She states she is exercising 0 minutes 0 times per week. Alexandra Walker is frustrated that she has not been losing more weight. She states that due to working at night, she skips some meals and does not get all the protein she is advised to have. Also states she has not been exercising and is not motivated to exercise. She would like her metabolism test repeated. She states she is thinking about dropping out of the program as she is not getting the results she wants. Her weight is 224 lb (101.6 kg) today and has gained 1 pound since her last visit. She has lost 10 lbs since starting treatment with [name].  Diabetes II Alexandra Walker has a diagnosis of diabetes type II. [name] states she is not checking BGs at home. She is on metformin and denies any hypoglycemic episodes. Last A1c was 5.5 on 03/13/17. She has been working on intensive lifestyle modifications including diet, exercise, and weight loss to help control her blood glucose levels.  ALLERGIES: Allergies  Allergen Reactions  . Ace Inhibitors Other (See Comments)    coughing  . Oxycodone-Acetaminophen Nausea And Vomiting  . Vicodin [Hydrocodone-Acetaminophen] Nausea And Vomiting  . Victoza [Liraglutide] Other (See Comments)    pancreatitis    MEDICATIONS: Current Outpatient Medications on File Prior to Visit  Medication Sig Dispense Refill  . aspirin 325 MG tablet Take 325 mg by mouth daily.    . Blood Glucose Monitoring Suppl (FREESTYLE LITE) DEVI Check blood sugar 2-3 times per day.  Dx:E11.21 1 each 0  . buPROPion (WELLBUTRIN SR) 150 MG 12 hr tablet Take 1 tablet (150 mg total) by mouth daily. 30 tablet 10  . cholecalciferol (VITAMIN D) 1000 UNITS tablet Take 1,000 Units by mouth daily.    .  cyclobenzaprine (FLEXERIL) 5 MG tablet Take 1 tablet (5 mg total) by mouth at bedtime as needed for muscle spasms. 30 tablet 2  . diclofenac sodium (VOLTAREN) 1 % GEL APPLY 2 GRAMS TOPICALLY 2 TIMES DAILY AS NEEDED. NOTE: 2 GRAMS IS EQUIVALENT TO A NICKEL SIZED AMOUNT.    Marland Kitchen glucose blood (FREESTYLE LITE) test strip Check blood sugar 2-3 times per day.  Dx:E11.21. 100 each 12  . ibuprofen (ADVIL,MOTRIN) 200 MG tablet Take 200 mg by mouth every 6 (six) hours as needed.    . Lancets (FREESTYLE) lancets Check blood sugar 2-3 times per day.  Dx:E11.21 100 each 12  . loratadine (CLARITIN) 10 MG tablet Take by mouth.    . losartan (COZAAR) 50 MG tablet Take 1 tablet (50 mg total) by mouth daily. 30 tablet 5  . metFORMIN (GLUCOPHAGE XR) 750 MG 24 hr tablet Take two daily before breakfast. 180 tablet 2  . metoprolol succinate (TOPROL-XL) 25 MG 24 hr tablet Take 1 tablet (25 mg total) by mouth daily. 90 tablet 2  . Misc. Devices (SCD SOFT SLEEVES/KNEE LENGTH) MISC To be used at home. 2 each 1  . Omega-3 Fatty Acids (FISH OIL PO) Take 1 tablet by mouth daily.    Marland Kitchen saccharomyces boulardii (FLORASTOR) 250 MG capsule Take 1 capsule (250 mg total) by mouth 2 (two) times daily. 60 capsule 0  . simvastatin (ZOCOR) 40 MG tablet Take 1 tablet (40 mg total) by  mouth every evening. 30 tablet 5  . sulfamethoxazole-trimethoprim (BACTRIM) 400-80 MG tablet Take 1 tablet by mouth 2 (two) times daily. 20 tablet 0  . Turmeric 450 MG CAPS Take 900 mg by mouth daily.     No current facility-administered medications on file prior to visit.     PAST MEDICAL HISTORY: Past Medical History:  Diagnosis Date  . 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 Arthritis   . Back pain   . Chronic depressive disorder   . Chronic edema    bilateral ankles  . Circadian rhythm sleep disorder   . Diabetes ([place])   . Fatty liver   . Ganglion of knee    left  . GERD (gastroesophageal reflux disease)    rare  . Glucose intolerance (impaired  glucose tolerance)   . Hepatic steatosis   . HLA B27 (HLA B27 positive)   . Hx of pancreatitis   . Hyperlipidemia   . Hypertension   . IBS (irritable bowel syndrome)   . IBS (irritable bowel syndrome)   . Ischemic colitis ([place])   . Joint pain   . Kyphosis   . Lateral epicondylitis of right elbow   . Leg edema   . Lymphadenopathy   . Migraine headache    history of  . Osteoarthritis   . PAC (premature atrial contraction)    PAC, PJC, managed well with BB  . Paronychia   . Paroxysmal atrial fibrillation ([place])    a. 01/2016 - brief episode during adm for severe sepsis.  Marland Kitchen PMS (premenstrual syndrome)   . PONV (postoperative nausea and vomiting)   . Pyelonephritis 01/2016  . Sacroiliitis ([place])   . Seasonal allergies   . Sepsis ([place]) 01/2016  . Sleep hypopnea   . SVT (supraventricular tachycardia) ([place])    a. 01/2016 - SVT, wide complex tachycardia, and brief afib in setting of severe sepsis.  . Trigger ring finger of left hand   . Vitamin D deficiency     PAST SURGICAL HISTORY: Past Surgical History:  Procedure Laterality Date  . ABDOMINAL HYSTERECTOMY    . BLADDER SURGERY    . BREAST BIOPSY Left   . CERVICAL CONE BIOPSY    . CHOLECYSTECTOMY N/A 10/26/2014   Procedure: LAPAROSCOPIC CHOLECYSTECTOMY;  Surgeon: Coralie Keens, MD;  Location: [facility];  Service: General;  Laterality: N/A;  . COLONOSCOPY W/ BIOPSIES  multiple  . DIAGNOSTIC LAPAROSCOPY    . DILATION AND CURETTAGE OF UTERUS  07,11  . ESOPHAGOGASTRODUODENOSCOPY  multiple  . KNEE ARTHROSCOPY Left 11/2008  . LAPAROSCOPIC VAGINAL HYSTERECTOMY WITH SALPINGO OOPHORECTOMY Bilateral 10/03/2015   Procedure: LAPAROSCOPIC ASSISTED VAGINAL HYSTERECTOMY WITH SALPINGECTOMY;  Surgeon: Eldred Manges, MD;  Location: [facility];  Service: Gynecology;  Laterality: Bilateral;  . LEFT FINGER SURGERY    . MASS EXCISION Left 12/24/2012   Procedure: LEFT EXCISION OF PALMAR MASS X TWO;  Surgeon: Roseanne Kaufman, MD;   Location: [facility];  Service: Orthopedics;  Laterality: Left;    SOCIAL HISTORY: Social History   Tobacco Use  . Smoking status: Never Smoker  . Smokeless tobacco: Never Used  Substance Use Topics  . Alcohol use: Yes    Alcohol/week: 0.0 oz    Comment: social  . Drug use: No    FAMILY HISTORY: Family History  Problem Relation Age of Onset  . Heart attack Father   . Heart disease Father   . Coronary artery disease Mother   . Hypertension Mother   .  Heart disease Mother   . Heart attack Mother   . Diabetes Mother   . Hyperlipidemia Mother   . Kidney disease Mother   . Depression Mother   . Sleep apnea Mother   . Obesity Mother   . Hypertension Sister   . Hypertension Brother   . Colon cancer Neg Hx     ROS: Review of Systems  Constitutional: Negative for weight loss.  Endo/Heme/Allergies:       Negative hypoglycemia    PHYSICAL EXAM: Blood pressure 109/70, pulse (!) 58, temperature 98.4 F (36.9 C), temperature source Oral, height '5\' 6"'  (1.676 m), weight 224 lb (101.6 kg), last menstrual period 05/20/2008, SpO2 99 %. Body mass index is 36.15 kg/m. Physical Exam  Constitutional: She is oriented to person, place, and time. She appears well-developed and well-nourished.  Cardiovascular: Bradycardia present.  Pulmonary/Chest: Effort normal.  Musculoskeletal: Normal range of motion.  Neurological: She is oriented to person, place, and time.  Skin: Skin is warm and dry.  Psychiatric: She has a normal mood and affect. Her behavior is normal.  Vitals reviewed.   RECENT LABS AND TESTS: BMET    Component Value Date/Time   NA 140 03/13/2017 0813   K 4.5 03/13/2017 0813   CL 103 03/13/2017 0813   CO2 25 03/13/2017 0813   GLUCOSE 112 (H) 03/13/2017 0813   GLUCOSE 112 (H) 02/02/2016 0357   BUN 22 03/13/2017 0813   CREATININE 0.88 03/13/2017 0813   CREATININE 0.69 01/11/2016 1537   CALCIUM 9.3 03/13/2017 0813   GFRNONAA 72 03/13/2017 0813    GFRAA 83 03/13/2017 0813   Lab Results  Component Value Date   HGBA1C 5.5 03/13/2017   HGBA1C 5.6 10/28/2016   Lab Results  Component Value Date   INSULIN 12.5 03/13/2017   INSULIN 10.0 10/28/2016   CBC    Component Value Date/Time   WBC 6.6 11/06/2016 1623   RBC 4.99 11/06/2016 1623   HGB 13.7 11/06/2016 1623   HGB 13.7 10/28/2016 1031   HCT 41.9 11/06/2016 1623   HCT 42.6 10/28/2016 1031   PLT 179 11/06/2016 1623   MCV 84.0 11/06/2016 1623   MCV 84 10/28/2016 1031   MCH 27.5 11/06/2016 1623   MCHC 32.7 11/06/2016 1623   RDW 13.2 11/06/2016 1623   RDW 13.0 10/28/2016 1031   LYMPHSABS 594 (L) 11/06/2016 1623   LYMPHSABS 1.2 10/28/2016 1031   MONOABS 330 11/06/2016 1623   EOSABS 0 (L) 11/06/2016 1623   EOSABS 0.2 10/28/2016 1031   BASOSABS 0 11/06/2016 1623   BASOSABS 0.0 10/28/2016 1031   Iron/TIBC/Ferritin/ %Sat No results found for: IRON, TIBC, FERRITIN, IRONPCTSAT Lipid Panel     Component Value Date/Time   CHOL 144 03/13/2017 0813   TRIG 125 03/13/2017 0813   HDL 41 03/13/2017 0813   LDLCALC 78 03/13/2017 0813   Hepatic Function Panel     Component Value Date/Time   PROT 6.3 03/13/2017 0813   ALBUMIN 4.0 03/13/2017 0813   AST 10 03/13/2017 0813   ALT 12 03/13/2017 0813   ALKPHOS 77 03/13/2017 0813   BILITOT 0.3 03/13/2017 0813      Component Value Date/Time   TSH 2.190 10/28/2016 1031    ASSESSMENT AND PLAN: Type 2 diabetes mellitus without complication, without long-term current use of insulin (HCC)  Class 2 severe obesity with serious comorbidity and body mass index (BMI) of 36.0 to 36.9 in adult, unspecified obesity type ([place])  PLAN:  Diabetes II Charmon has been  given extensive diabetes education by myself today including ideal fasting and post-prandial blood glucose readings, individual ideal Hgb A1c goals and hypoglycemia prevention. We discussed the importance of good blood sugar control to decrease the likelihood of diabetic complications  such as nephropathy, neuropathy, limb loss, blindness, coronary artery disease, and death. We discussed the importance of intensive lifestyle modification including diet, exercise and weight loss as the first line treatment for diabetes. Coral agrees to continue her diabetes medications as prescribed and she agrees to follow up with our clinic in 3 weeks.  We spent > than 50% of the 15 minute visit on the counseling as documented in the note.  Obesity Artesia is currently in the action stage of change. As such, her goal is to continue with weight loss efforts She has agreed to follow the Category 2 plan Tarnisha has been instructed to work up to a goal of 150 minutes of combined cardio and strengthening exercise per week for weight loss and overall health benefits. We discussed the following Behavioral Modification Strategies today: increasing lean protein intake and work on meal planning and easy cooking plans   Alyissa has agreed to follow up with our clinic in 3 weeks. She was informed of the importance of frequent follow up visits to maximize her success with intensive lifestyle modifications for her multiple health conditions.   OBESITY BEHAVIORAL INTERVENTION VISIT  Today's visit was # 11 out of 22.  Starting weight: 234 lbs Starting date: 10/28/16 Today's weight : 224 lbs Today's date: 07/01/2017 Total lbs lost to date: 10 (Patients must lose 7 lbs in the first 6 months to continue with counseling)   ASK: We discussed the diagnosis of obesity with [name] today and Lelan Pons agreed to give [name] permission to discuss obesity behavioral modification therapy today.  ASSESS: Lavina has the diagnosis of obesity and her BMI today is 36.17 Phaedra is in the action stage of change   ADVISE: Ethelean was educated on the multiple health risks of obesity as well as the benefit of weight loss to improve her health. She was advised of the need for long term treatment and the importance of lifestyle  modifications.  AGREE: Multiple dietary modification options and treatment options were discussed and  [place] agreed to the above obesity treatment plan.   Wilhemena Durie, am acting as transcriptionist for Lacy Duverney, PA-C I, Lacy Duverney [facility], have reviewed this note and agree with its content.

## 2017-07-15 DIAGNOSIS — G4733 Obstructive sleep apnea (adult) (pediatric): Secondary | ICD-10-CM | POA: Diagnosis not present

## 2017-07-15 MED FILL — BUPROPION SR 150 MG TABLET: 150 | 30 days supply | Qty: 30 | Fill #2

## 2017-07-15 MED FILL — METOPROLOL SUCCINATE ER 50: 50 | 90 days supply | Qty: 90 | Fill #1

## 2017-07-16 ENCOUNTER — Other Ambulatory Visit: Payer: Self-pay | Admitting: Surgical

## 2017-07-16 MED ORDER — LOSARTAN POTASSIUM 50 MG PO TABS: 50.0000 mg | ORAL_TABLET | Freq: Every day | ORAL | 5 refills | Status: DC

## 2017-07-16 MED ORDER — SIMVASTATIN 40 MG PO TABS: 40.0000 mg | ORAL_TABLET | Freq: Every evening | ORAL | 5 refills | Status: DC

## 2017-07-16 MED FILL — LOSARTAN POTASSIUM 50 MG TA: 50 | 90 days supply | Qty: 90 | Fill #0

## 2017-07-16 MED FILL — SIMVASTATIN 40 MG TABLET: 40 | 90 days supply | Qty: 90 | Fill #0

## 2017-07-16 NOTE — Progress Notes (Signed)
OK to refill per Dr. Juleen China

## 2017-07-17 DIAGNOSIS — M79644 Pain in right finger(s): Secondary | ICD-10-CM | POA: Diagnosis not present

## 2017-07-21 ENCOUNTER — Ambulatory Visit (INDEPENDENT_AMBULATORY_CARE_PROVIDER_SITE_OTHER): Payer: 59 | Admitting: Physician Assistant

## 2017-07-21 VITALS — BP 142/72 | HR 57 | Temp 98.2°F | Ht 66.0 in | Wt 224.0 lb

## 2017-07-21 DIAGNOSIS — Z6836 Body mass index (BMI) 36.0-36.9, adult: Secondary | ICD-10-CM | POA: Diagnosis not present

## 2017-07-21 DIAGNOSIS — I10 Essential (primary) hypertension: Secondary | ICD-10-CM

## 2017-07-21 NOTE — Progress Notes (Signed)
Office: [phone]  /  Fax: [phone]   HPI:   Chief Complaint: OBESITY Alexandra Walker is here to discuss her progress with her obesity treatment plan. She is on the Category 2 plan and is following her eating plan approximately 95 % of the time. She states she is walking (treadmill) for 60 minutes 2 times per week. Bridgette states she is frustrated that she has not lost more weight since start of program, and states will stop the program.  She is encouraged to think about whether or not she would like to continue following up with our clinic. The importance of shorter follow up period to maximize her success was discussed.  She is advised to call back or use MyChart to schedule her future appointment. She is aware that she needs to return within 6 months to avoid new patient fees.   Her weight is 224 lb (101.6 kg) today and has not lost weight since her last visit. She has lost 10 lbs since starting treatment with [name].  Hypertension Alexandra Walker is a 62 y.o. female with hypertension. BP in the office is elevated today.   Alexandra Walker denies chest pain or shortness of breath on exertion. She is working weight loss to help control her blood pressure with the goal of decreasing her risk of heart attack and stroke. Alexandra Walker blood pressure is not currently controlled.   ALLERGIES: Allergies  Allergen Reactions  . Ace Inhibitors Other (See Comments)    coughing  . Oxycodone-Acetaminophen Nausea And Vomiting  . Vicodin [Hydrocodone-Acetaminophen] Nausea And Vomiting  . Victoza [Liraglutide] Other (See Comments)    pancreatitis    MEDICATIONS: Current Outpatient Medications on File Prior to Visit  Medication Sig Dispense Refill  . aspirin 325 MG tablet Take 325 mg by mouth daily.    . Blood Glucose Monitoring Suppl (FREESTYLE LITE) DEVI Check blood sugar 2-3 times per day.  Dx:E11.21 1 each 0  . buPROPion (WELLBUTRIN SR) 150 MG 12 hr tablet Take 1 tablet (150 mg total) by mouth daily. 30  tablet 10  . cholecalciferol (VITAMIN D) 1000 UNITS tablet Take 1,000 Units by mouth daily.    . cyclobenzaprine (FLEXERIL) 5 MG tablet Take 1 tablet (5 mg total) by mouth at bedtime as needed for muscle spasms. 30 tablet 2  . diclofenac sodium (VOLTAREN) 1 % GEL APPLY 2 GRAMS TOPICALLY 2 TIMES DAILY AS NEEDED. NOTE: 2 GRAMS IS EQUIVALENT TO A NICKEL SIZED AMOUNT.    Marland Kitchen glucose blood (FREESTYLE LITE) test strip Check blood sugar 2-3 times per day.  Dx:E11.21. 100 each 12  . ibuprofen (ADVIL,MOTRIN) 200 MG tablet Take 200 mg by mouth every 6 (six) hours as needed.    . Lancets (FREESTYLE) lancets Check blood sugar 2-3 times per day.  Dx:E11.21 100 each 12  . loratadine (CLARITIN) 10 MG tablet Take by mouth.    . losartan (COZAAR) 50 MG tablet Take 1 tablet (50 mg total) by mouth daily. 30 tablet 5  . metFORMIN (GLUCOPHAGE XR) 750 MG 24 hr tablet Take two daily before breakfast. 180 tablet 2  . metoprolol succinate (TOPROL-XL) 25 MG 24 hr tablet Take 1 tablet (25 mg total) by mouth daily. 90 tablet 2  . Misc. Devices (SCD SOFT SLEEVES/KNEE LENGTH) MISC To be used at home. 2 each 1  . Omega-3 Fatty Acids (FISH OIL PO) Take 1 tablet by mouth daily.    Marland Kitchen saccharomyces boulardii (FLORASTOR) 250 MG capsule Take 1 capsule (250 mg total)  by mouth 2 (two) times daily. 60 capsule 0  . simvastatin (ZOCOR) 40 MG tablet Take 1 tablet (40 mg total) by mouth every evening. 30 tablet 5  . sulfamethoxazole-trimethoprim (BACTRIM) 400-80 MG tablet Take 1 tablet by mouth 2 (two) times daily. 20 tablet 0  . Turmeric 450 MG CAPS Take 900 mg by mouth daily.     No current facility-administered medications on file prior to visit.     PAST MEDICAL HISTORY: Past Medical History:  Diagnosis Date  . 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 Arthritis   . Back pain   . Chronic depressive disorder   . Chronic edema    bilateral ankles  . Circadian rhythm sleep disorder   . Diabetes ([name])   . Fatty liver   . Ganglion of  knee    left  . GERD (gastroesophageal reflux disease)    rare  . Glucose intolerance (impaired glucose tolerance)   . Hepatic steatosis   . HLA B27 (HLA B27 positive)   . Hx of pancreatitis   . Hyperlipidemia   . Hypertension   . IBS (irritable bowel syndrome)   . IBS (irritable bowel syndrome)   . Ischemic colitis ([place])   . Joint pain   . Kyphosis   . Lateral epicondylitis of right elbow   . Leg edema   . Lymphadenopathy   . Migraine headache    history of  . Osteoarthritis   . PAC (premature atrial contraction)    PAC, PJC, managed well with BB  . Paronychia   . Paroxysmal atrial fibrillation ([name])    a. 01/2016 - brief episode during adm for severe sepsis.  Marland Kitchen PMS (premenstrual syndrome)   . PONV (postoperative nausea and vomiting)   . Pyelonephritis 01/2016  . Sacroiliitis ([place])   . Seasonal allergies   . Sepsis ([place]) 01/2016  . Sleep hypopnea   . SVT (supraventricular tachycardia) ([name])    a. 01/2016 - SVT, wide complex tachycardia, and brief afib in setting of severe sepsis.  . Trigger ring finger of left hand   . Vitamin D deficiency     PAST SURGICAL HISTORY: Past Surgical History:  Procedure Laterality Date  . ABDOMINAL HYSTERECTOMY    . BLADDER SURGERY    . BREAST BIOPSY Left   . CERVICAL CONE BIOPSY    . CHOLECYSTECTOMY N/A 10/26/2014   Procedure: LAPAROSCOPIC CHOLECYSTECTOMY;  Surgeon: Coralie Keens, MD;  Location: [facility];  Service: General;  Laterality: N/A;  . COLONOSCOPY W/ BIOPSIES  multiple  . DIAGNOSTIC LAPAROSCOPY    . DILATION AND CURETTAGE OF UTERUS  07,11  . ESOPHAGOGASTRODUODENOSCOPY  multiple  . KNEE ARTHROSCOPY Left 11/2008  . LAPAROSCOPIC VAGINAL HYSTERECTOMY WITH SALPINGO OOPHORECTOMY Bilateral 10/03/2015   Procedure: LAPAROSCOPIC ASSISTED VAGINAL HYSTERECTOMY WITH SALPINGECTOMY;  Surgeon: Eldred Manges, MD;  Location: [facility];  Service: Gynecology;  Laterality: Bilateral;  . LEFT FINGER SURGERY    . MASS EXCISION  Left 12/24/2012   Procedure: LEFT EXCISION OF PALMAR MASS X TWO;  Surgeon: Roseanne Kaufman, MD;  Location: [facility];  Service: Orthopedics;  Laterality: Left;    SOCIAL HISTORY: Social History   Tobacco Use  . Smoking status: Never Smoker  . Smokeless tobacco: Never Used  Substance Use Topics  . Alcohol use: Yes    Alcohol/week: 0.0 oz    Comment: social  . Drug use: No    FAMILY HISTORY: Family History  Problem Relation Age of Onset  . Heart  attack Father   . Heart disease Father   . Coronary artery disease Mother   . Hypertension Mother   . Heart disease Mother   . Heart attack Mother   . Diabetes Mother   . Hyperlipidemia Mother   . Kidney disease Mother   . Depression Mother   . Sleep apnea Mother   . Obesity Mother   . Hypertension Sister   . Hypertension Brother   . Colon cancer Neg Hx     ROS: Review of Systems  Constitutional: Negative for weight loss.  Respiratory: Negative for shortness of breath.   Cardiovascular: Negative for chest pain.    PHYSICAL EXAM: Blood pressure (!) 142/72, pulse (!) 57, temperature 98.2 F (36.8 C), temperature source Oral, height '5\' 6"'  (1.676 m), weight 224 lb (101.6 kg), last menstrual period 05/20/2008, SpO2 98 %. Body mass index is 36.15 kg/m. Physical Exam  Constitutional: She is oriented to person, place, and time. She appears well-developed and well-nourished.  Cardiovascular: Bradycardia present.  Pulmonary/Chest: Effort normal.  Musculoskeletal: Normal range of motion.  Neurological: She is oriented to person, place, and time.  Skin: Skin is warm and dry.  Psychiatric: She has a normal mood and affect. Her behavior is normal.  Vitals reviewed.   RECENT LABS AND TESTS: BMET    Component Value Date/Time   NA 140 03/13/2017 0813   K 4.5 03/13/2017 0813   CL 103 03/13/2017 0813   CO2 25 03/13/2017 0813   GLUCOSE 112 (H) 03/13/2017 0813   GLUCOSE 112 (H) 02/02/2016 0357   BUN 22 03/13/2017  0813   CREATININE 0.88 03/13/2017 0813   CREATININE 0.69 01/11/2016 1537   CALCIUM 9.3 03/13/2017 0813   GFRNONAA 72 03/13/2017 0813   GFRAA 83 03/13/2017 0813   Lab Results  Component Value Date   HGBA1C 5.5 03/13/2017   HGBA1C 5.6 10/28/2016   Lab Results  Component Value Date   INSULIN 12.5 03/13/2017   INSULIN 10.0 10/28/2016   CBC    Component Value Date/Time   WBC 6.6 11/06/2016 1623   RBC 4.99 11/06/2016 1623   HGB 13.7 11/06/2016 1623   HGB 13.7 10/28/2016 1031   HCT 41.9 11/06/2016 1623   HCT 42.6 10/28/2016 1031   PLT 179 11/06/2016 1623   MCV 84.0 11/06/2016 1623   MCV 84 10/28/2016 1031   MCH 27.5 11/06/2016 1623   MCHC 32.7 11/06/2016 1623   RDW 13.2 11/06/2016 1623   RDW 13.0 10/28/2016 1031   LYMPHSABS 594 (L) 11/06/2016 1623   LYMPHSABS 1.2 10/28/2016 1031   MONOABS 330 11/06/2016 1623   EOSABS 0 (L) 11/06/2016 1623   EOSABS 0.2 10/28/2016 1031   BASOSABS 0 11/06/2016 1623   BASOSABS 0.0 10/28/2016 1031   Iron/TIBC/Ferritin/ %Sat No results found for: IRON, TIBC, FERRITIN, IRONPCTSAT Lipid Panel     Component Value Date/Time   CHOL 144 03/13/2017 0813   TRIG 125 03/13/2017 0813   HDL 41 03/13/2017 0813   LDLCALC 78 03/13/2017 0813   Hepatic Function Panel     Component Value Date/Time   PROT 6.3 03/13/2017 0813   ALBUMIN 4.0 03/13/2017 0813   AST 10 03/13/2017 0813   ALT 12 03/13/2017 0813   ALKPHOS 77 03/13/2017 0813   BILITOT 0.3 03/13/2017 0813      Component Value Date/Time   TSH 2.190 10/28/2016 1031    ASSESSMENT AND PLAN: Essential hypertension  Class 2 severe obesity with serious comorbidity and body mass index (BMI) of  36.0 to 36.9 in adult, unspecified obesity type ([place])  PLAN:  Hypertension We discussed sodium restriction, working on healthy weight loss, and a regular exercise program as the means to achieve improved blood pressure control. Kenzly agreed with this plan and agreed to follow up as directed. We will  continue to monitor her blood pressure as well as her progress with the above lifestyle modifications. She will continue her medications as prescribed and will watch for signs of hypotension as she continues her lifestyle modifications. Jeniya will follow up with our clinic as needed.  We spent > than 50% of the 15 minute visit on the counseling as documented in the note.  Obesity Elzada is not on the action stage of change. She is considering whether or not she will continue to follow up with [name]. She has agreed to call us back or use MyChart to reschedule her furture appointment with [name] (within 6 months). Gracy states she will incorporate her own exercise regimen.  We discussed the following Behavioral Modification Strategies today: following our nutritional advise, shorter follow up period to maximize her success,  increasing lean protein intake and work on meal planning and easy cooking plans, and not skipping meals. Eliette has agreed to follow up with our clinic as needed. She was informed of the importance of frequent follow up visits to maximize her success with intensive lifestyle modifications for her multiple health conditions.   OBESITY BEHAVIORAL INTERVENTION VISIT  Today's visit was # 12 out of 22.  Starting weight: 234 lbs Starting date: 10/28/16 Today's weight : 224 lbs  Today's date: 07/21/2017 Total lbs lost to date: 10 (Patients must lose 7 lbs in the first 6 months to continue with counseling)   ASK: We discussed the diagnosis of obesity with Alexandra Walker today and Lelan Pons agreed to give [name] permission to discuss obesity behavioral modification therapy today.  ASSESS: Georgette has the diagnosis of obesity and her BMI today is 36.17 Ottilie is not in the action stage of change   ADVISE: Gordie was educated on the multiple health risks of obesity as well as the benefit of weight loss to improve her health. She was advised of the need for long term treatment and the importance of  lifestyle modifications.  AGREE: Multiple dietary modification options and treatment options were discussed and  Demika did not agreed to the above obesity treatment plan.   Wilhemena Durie, am acting as transcriptionist for Lacy Duverney, PA-C I, Lacy Duverney [facility], have reviewed this note and agree with its content.

## 2017-08-12 MED FILL — BUPROPION SR 150 MG TABLET: 150 | 30 days supply | Qty: 30 | Fill #3

## 2017-08-29 DIAGNOSIS — S63636A Sprain of interphalangeal joint of right little finger, initial encounter: Secondary | ICD-10-CM | POA: Diagnosis not present

## 2017-08-29 DIAGNOSIS — M79644 Pain in right finger(s): Secondary | ICD-10-CM | POA: Diagnosis not present

## 2017-09-16 ENCOUNTER — Encounter: Payer: Self-pay | Admitting: Family Medicine

## 2017-09-18 ENCOUNTER — Telehealth: Payer: Self-pay

## 2017-09-18 MED FILL — METOPROLOL SUCCINATE ER 25: 25 | 90 days supply | Qty: 90 | Fill #2

## 2017-09-18 MED FILL — METFORMIN HCL ER 750 MG TAB: 750 | 90 days supply | Qty: 180 | Fill #2

## 2017-09-19 ENCOUNTER — Ambulatory Visit (INDEPENDENT_AMBULATORY_CARE_PROVIDER_SITE_OTHER): Payer: 59 | Admitting: Family Medicine

## 2017-09-19 ENCOUNTER — Encounter: Payer: Self-pay | Admitting: Family Medicine

## 2017-09-19 VITALS — BP 130/70 | HR 57 | Temp 97.8°F | Ht 66.0 in | Wt 228.8 lb

## 2017-09-19 DIAGNOSIS — F902 Attention-deficit hyperactivity disorder, combined type: Secondary | ICD-10-CM | POA: Diagnosis not present

## 2017-09-19 DIAGNOSIS — E119 Type 2 diabetes mellitus without complications: Secondary | ICD-10-CM | POA: Diagnosis not present

## 2017-09-19 DIAGNOSIS — K760 Fatty (change of) liver, not elsewhere classified: Secondary | ICD-10-CM

## 2017-09-19 DIAGNOSIS — R7989 Other specified abnormal findings of blood chemistry: Secondary | ICD-10-CM

## 2017-09-19 DIAGNOSIS — E785 Hyperlipidemia, unspecified: Secondary | ICD-10-CM

## 2017-09-19 DIAGNOSIS — E1169 Type 2 diabetes mellitus with other specified complication: Secondary | ICD-10-CM

## 2017-09-19 DIAGNOSIS — R945 Abnormal results of liver function studies: Secondary | ICD-10-CM | POA: Diagnosis not present

## 2017-09-19 LAB — CBC WITH DIFFERENTIAL/PLATELET
Basophils Absolute: 0 10*3/uL (ref 0.0–0.1)
Basophils Relative: 0.5 % (ref 0.0–3.0)
Eosinophils Absolute: 0.2 10*3/uL (ref 0.0–0.7)
Eosinophils Relative: 3.4 % (ref 0.0–5.0)
HCT: 38.8 % (ref 36.0–46.0)
Hemoglobin: 12.9 g/dL (ref 12.0–15.0)
Lymphocytes Relative: 28.3 % (ref 12.0–46.0)
Lymphs Abs: 1.4 10*3/uL (ref 0.7–4.0)
MCHC: 33.2 g/dL (ref 30.0–36.0)
MCV: 84 fl (ref 78.0–100.0)
Monocytes Absolute: 0.4 10*3/uL (ref 0.1–1.0)
Monocytes Relative: 8.2 % (ref 3.0–12.0)
Neutro Abs: 2.9 10*3/uL (ref 1.4–7.7)
Neutrophils Relative %: 59.6 % (ref 43.0–77.0)
Platelets: 196 10*3/uL (ref 150.0–400.0)
RBC: 4.62 Mil/uL (ref 3.87–5.11)
RDW: 13.2 % (ref 11.5–15.5)
WBC: 4.9 10*3/uL (ref 4.0–10.5)

## 2017-09-19 LAB — COMPREHENSIVE METABOLIC PANEL
ALT: 10 U/L (ref 0–35)
AST: 12 U/L (ref 0–37)
Albumin: 4.1 g/dL (ref 3.5–5.2)
Alkaline Phosphatase: 71 U/L (ref 39–117)
BUN: 17 mg/dL (ref 6–23)
CO2: 29 mEq/L (ref 19–32)
Calcium: 9.5 mg/dL (ref 8.4–10.5)
Chloride: 103 mEq/L (ref 96–112)
Creatinine, Ser: 0.74 mg/dL (ref 0.40–1.20)
GFR: 85.22 mL/min (ref >60.00)
Glucose, Bld: 100 mg/dL — ABNORMAL HIGH (ref 70–99)
Potassium: 4.2 mEq/L (ref 3.5–5.1)
Sodium: 139 mEq/L (ref 135–145)
Total Bilirubin: 0.5 mg/dL (ref 0.2–1.2)
Total Protein: 6.8 g/dL (ref 6.0–8.3)

## 2017-09-19 LAB — LIPID PANEL
Cholesterol: 137 mg/dL (ref 0–200)
HDL: 44.9 mg/dL (ref >39.00)
LDL Cholesterol: 72 mg/dL (ref 0–99)
NonHDL: 91.92
Total CHOL/HDL Ratio: 3
Triglycerides: 99 mg/dL (ref 0.0–149.0)
VLDL: 19.8 mg/dL (ref 0.0–40.0)

## 2017-09-19 LAB — HEMOGLOBIN A1C: Hgb A1c MFr Bld: 5.7 % (ref 4.6–6.5)

## 2017-09-19 LAB — TSH: TSH: 1.76 u[IU]/mL (ref 0.35–4.50)

## 2017-09-19 MED ORDER — LISDEXAMFETAMINE DIMESYLATE 10 MG PO CAPS: 10.0000 mg | ORAL_CAPSULE | Freq: Every day | ORAL | 0 refills | Status: DC

## 2017-09-19 MED FILL — VYVANSE 10 MG CAPSULE: 10 | 30 days supply | Qty: 30 | Fill #0

## 2017-09-19 NOTE — Progress Notes (Signed)
Alexandra Walker is a 62 y.o. female is here for follow up.  History of Present Illness:   HPI: Patient with history of elevated LFTs and fatty liver.  Being followed by hematology at [facility].  Worried about NASH.  Was included and a study previously but was unable to.  No history of biopsy.  Patient request labs today in prep for her visit at [facility].  She also asks about medication for ADHD and weight loss.  She has been a patient at the medical [facility] center for several months and was doing well.  Unfortunately, she has had a plateau.  She feels that her diet is being followed.  She admits that she does not exercise regularly.  She does work nights.  See below for ADHD questionnaire.  History of palpitations.  Controlled with beta-blocker.  Health Maintenance Due  Topic Date Due  . OPHTHALMOLOGY EXAM  01/18/2017   Depression screen PHQ 2/9 10/28/2016  Decreased Interest 1  Down, Depressed, Hopeless 3  PHQ - 2 Score 4  Altered sleeping 2  Tired, decreased energy 2  Change in appetite 2  Feeling bad or failure about yourself  2  Trouble concentrating 3  Moving slowly or fidgety/restless 0  Suicidal thoughts 0  PHQ-9 Score 15   PMHx, SurgHx, SocialHx, FamHx, Medications, and Allergies were reviewed in the Visit Navigator and updated as appropriate.   Patient Active Problem List   Diagnosis Date Noted  . Recurrent circadian rhythm sleep disorder, shift work type 03/31/2017  . Morbid obesity ([place]) 03/31/2017  . Cervical intraepithelial neoplasia grade 1 03/17/2017  . Female bladder prolapse 03/17/2017  . Irritable bowel syndrome 03/17/2017  . Class 2 obesity with serious comorbidity and body mass index (BMI) of 35.0 to 35.9 in adult 12/30/2016  . Circadian rhythm sleep disorder, shift work type 09/26/2016  . Fatty infiltration of liver 09/26/2016  . OSA on CPAP 09/26/2016  . PAF (paroxysmal atrial fibrillation) ([place]) 02/01/2016  . SVT (supraventricular tachycardia) ([place])  01/31/2016  . Essential hypertension 01/31/2016  . Postmenopausal bleeding 10/03/2015  . LGSIL (low grade squamous intraepithelial dysplasia) 08/05/2015  . PMB (postmenopausal bleeding) 08/05/2015  . Dupuytren contracture 05/31/2015  . Elevated LFTs 05/31/2015  . Kyphosis, acquired 05/31/2015  . Premenstrual tension syndrome 05/31/2015  . Tenosynovitis of right foot 05/31/2015  . Allergic rhinitis 05/10/2015  . Cardiac dysrhythmia 05/10/2015  . Chronic depressive disorder 05/10/2015  . Edema 05/10/2015  . Gastroesophageal reflux disease without esophagitis 05/10/2015  . Hyperlipidemia associated with type 2 diabetes mellitus ([place]) 05/10/2015  . Microalbuminuria 05/10/2015  . Migraine headache without aura 05/10/2015  . Mixed hyperlipidemia 05/10/2015  . Vitamin D deficiency 05/10/2015  . PAC (premature atrial contraction) 09/19/2011   Social History   Tobacco Use  . Smoking status: Never Smoker  . Smokeless tobacco: Never Used  Substance Use Topics  . Alcohol use: Yes    Alcohol/week: 0.0 oz    Comment: social  . Drug use: No   Current Medications and Allergies:   .  aspirin 325 MG tablet, Take 325 mg by mouth daily., Disp: , Rfl:  .  Blood Glucose Monitoring Suppl (FREESTYLE LITE) DEVI, Check blood sugar 2-3 times per day.  Dx:E11.21, Disp: 1 each, Rfl: 0 .  buPROPion (WELLBUTRIN SR) 150 MG 12 hr tablet, Take 1 tablet (150 mg total) by mouth daily., Disp: 30 tablet, Rfl: 10 .  cholecalciferol (VITAMIN D) 1000 UNITS tablet, Take 1,000 Units by mouth daily., Disp: ,  Rfl:  .  cyclobenzaprine (FLEXERIL) 5 MG tablet, Take 1 tablet (5 mg total) by mouth at bedtime as needed for muscle spasms., Disp: 30 tablet, Rfl: 2 .  diclofenac sodium (VOLTAREN) 1 % GEL, APPLY 2 GRAMS TOPICALLY 2 TIMES DAILY AS NEEDED. NOTE: 2 GRAMS IS EQUIVALENT TO A NICKEL SIZED AMOUNT., Disp: , Rfl:  .  glucose blood (FREESTYLE LITE) test strip, Check blood sugar 2-3 times per day.  Dx:E11.21., Disp: 100  each, Rfl: 12 .  ibuprofen (ADVIL,MOTRIN) 200 MG tablet, Take 200 mg by mouth every 6 (six) hours as needed., Disp: , Rfl:  .  Lancets (FREESTYLE) lancets, Check blood sugar 2-3 times per day.  Dx:E11.21, Disp: 100 each, Rfl: 12 .  loratadine (CLARITIN) 10 MG tablet, Take by mouth., Disp: , Rfl:  .  losartan (COZAAR) 50 MG tablet, Take 1 tablet (50 mg total) by mouth daily., Disp: 30 tablet, Rfl: 5 .  metFORMIN (GLUCOPHAGE XR) 750 MG 24 hr tablet, Take two daily before breakfast., Disp: 180 tablet, Rfl: 2 .  metoprolol succinate (TOPROL-XL) 25 MG 24 hr tablet, Take 1 tablet (25 mg total) by mouth daily., Disp: 90 tablet, Rfl: 2 .  Misc. Devices (SCD SOFT SLEEVES/KNEE LENGTH) MISC, To be used at home., Disp: 2 each, Rfl: 1 .  Omega-3 Fatty Acids (FISH OIL PO), Take 1 tablet by mouth daily., Disp: , Rfl:  .  saccharomyces boulardii (FLORASTOR) 250 MG capsule, Take 1 capsule (250 mg total) by mouth 2 (two) times daily., Disp: 60 capsule, Rfl: 0 .  simvastatin (ZOCOR) 40 MG tablet, Take 1 tablet (40 mg total) by mouth every evening., Disp: 30 tablet, Rfl: 5 .  Turmeric 450 MG CAPS, Take 900 mg by mouth daily., Disp: , Rfl:    Allergies  Allergen Reactions  . Ace Inhibitors Other (See Comments)    coughing  . Oxycodone-Acetaminophen Nausea And Vomiting  . Vicodin [Hydrocodone-Acetaminophen] Nausea And Vomiting  . Victoza [Liraglutide] Other (See Comments)    pancreatitis   Review of Systems   Pertinent items are noted in the HPI. Otherwise, ROS is negative.  Vitals:   Vitals:   09/19/17 0946  BP: 130/70  Pulse: (!) 57  Temp: 97.8 F (36.6 C)  TempSrc: Oral  SpO2: 97%  Weight: 228 lb 12.8 oz (103.8 kg)  Height: 5\' 6"  (1.676 m)     Body mass index is 36.93 kg/m.   Physical Exam:   Physical Exam  Constitutional: She is oriented to person, place, and time. She appears well-developed and well-nourished. No distress.  HENT:  Head: Normocephalic and atraumatic.  Right Ear:  External ear normal.  Left Ear: External ear normal.  Nose: Nose normal.  Mouth/Throat: Oropharynx is clear and moist.  Eyes: Pupils are equal, round, and reactive to light. Conjunctivae and EOM are normal.  Neck: Normal range of motion. Neck supple. No thyromegaly present.  Cardiovascular: Normal rate, regular rhythm, normal heart sounds and intact distal pulses.  Pulmonary/Chest: Effort normal and breath sounds normal.  Abdominal: Soft. Bowel sounds are normal.  Musculoskeletal: Normal range of motion.  Lymphadenopathy:    She has no cervical adenopathy.  Neurological: She is alert and oriented to person, place, and time.  Skin: Skin is warm and dry. Capillary refill takes less than 2 seconds.  Psychiatric: She has a normal mood and affect. Her behavior is normal.  Nursing note and vitals reviewed.     Adult ADHD Self Report Scale (most recent)    Adult  ADHD Self-Report Scale (ASRS-v1.1) Symptom Checklist - 09/22/17 0906      Part A   1. How often do you have trouble wrapping up the final details of a project, once the challenging parts have been done?  Often  (Abnormal)   2. How often do you have difficulty getting things done in order when you have to do a task that requires organization?  Often  (Abnormal)     3. How often do you have problems remembering appointments or obligations?  Sometimes  (Abnormal)   4. When you have a task that requires a lot of thought, how often do you avoid or delay getting started?  Sometimes    5. How often do you fidget or squirm with your hands or feet when you have to sit down for a long time?  Often  (Abnormal)   6. How often do you feel overly active and compelled to do things, like you were driven by a motor?  Rarely      Part B   7. How often do you make careless mistakes when you have to work on a boring or difficult project?  Sometimes  8. How often do you have difficulty keeping your attention when you are doing boring or repetitive work?   Often  (Abnormal)     9. How often do you have difficulty concentrating on what people say to you, even when they are speaking to you directly?  Often  (Abnormal)   10. How often do you misplace or have difficulty finding things at home or at work?  Sometimes    12. How often do you leave your seat in meetings or other situations in which you are expected to remain seated?  Rarely  13. How often do you feel restless or fidgety?  Sometimes    14. How often do you have difficulty unwinding and relaxing when you have time to yourself?  Often  (Abnormal)   15. How often do you find yourself talking too much when you are in social situations?  Sometimes    16. When you are in a conversation, how often do you find yourself finishing the sentences of the people you are talking to, before they can finish them themselves?  Often  (Abnormal)   17. How often do you have difficulty waiting your turn in situations when turn taking is required?  Sometimes    18. How often do you interrupt others when they are busy?  Sometimes  (Abnormal)         Assessment and Plan:   Alexandra Walker was seen today for follow-up.  Diagnoses and all orders for this visit:  Labs today.  We discussed multiple options for medications for weight loss.  We discussed the importance of sleep.  Okay trial of very low-dose Vyvanse.  Okay to get clearance from cardiology first.  Fatty infiltration of liver -     CBC with Differential/Platelet -     Comprehensive metabolic panel  Morbid obesity (HCC) -     CBC with Differential/Platelet -     TSH -     Lipid panel -     Comprehensive metabolic panel  Hyperlipidemia associated with type 2 diabetes mellitus (HCC) -     Lipid panel  Elevated LFTs  Attention deficit hyperactivity disorder (ADHD), combined type -     lisdexamfetamine (VYVANSE) 10 MG capsule; Take 1 capsule (10 mg total) by mouth daily.  Type 2 diabetes mellitus without complication, without  long-term current use of insulin  (HCC) -     CBC with Differential/Platelet -     Hemoglobin A1c -     Lipid panel -     Comprehensive metabolic panel   . Reviewed expectations re: course of current medical issues. . Discussed self-management of symptoms. . Outlined signs and symptoms indicating need for more acute intervention. . Patient verbalized understanding and all questions were answered. Marland Kitchen Health Maintenance issues including appropriate healthy diet, exercise, and smoking avoidance were discussed with patient. . See orders for this visit as documented in the electronic medical record. . Patient received an After Visit Summary.  Briscoe Deutscher, DO [place] [place] [facility] 09/24/2017  Future Appointments  Date Time Provider [place]  10/17/2017  2:20 PM Briscoe Deutscher, DO [facility]  12/16/2017  1:00 PM Briscoe Deutscher, DO [facility]  04/01/2018 10:30 AM Dohmeier, Asencion Partridge, MD [facility] None

## 2017-09-22 ENCOUNTER — Encounter: Payer: Self-pay | Admitting: Family Medicine

## 2017-09-23 DIAGNOSIS — K760 Fatty (change of) liver, not elsewhere classified: Secondary | ICD-10-CM | POA: Diagnosis not present

## 2017-09-23 DIAGNOSIS — E669 Obesity, unspecified: Secondary | ICD-10-CM | POA: Diagnosis not present

## 2017-09-24 ENCOUNTER — Encounter: Payer: Self-pay | Admitting: Family Medicine

## 2017-10-02 DIAGNOSIS — H43393 Other vitreous opacities, bilateral: Secondary | ICD-10-CM | POA: Diagnosis not present

## 2017-10-03 NOTE — Telephone Encounter (Signed)
Error

## 2017-10-06 DIAGNOSIS — M79644 Pain in right finger(s): Secondary | ICD-10-CM | POA: Diagnosis not present

## 2017-10-06 DIAGNOSIS — S63636D Sprain of interphalangeal joint of right little finger, subsequent encounter: Secondary | ICD-10-CM | POA: Diagnosis not present

## 2017-10-10 ENCOUNTER — Encounter: Payer: Self-pay | Admitting: Family Medicine

## 2017-10-10 ENCOUNTER — Other Ambulatory Visit: Payer: Self-pay

## 2017-10-10 MED ORDER — METHYLPREDNISOLONE 4 MG PO TBPK: ORAL_TABLET | ORAL | 0 refills | Status: DC

## 2017-10-10 MED FILL — METHYLPREDNISOLONE 4 MG TAB: 4 | 6 days supply | Qty: 21 | Fill #0

## 2017-10-10 NOTE — Progress Notes (Unsigned)
med

## 2017-10-11 ENCOUNTER — Encounter: Payer: Self-pay | Admitting: Family Medicine

## 2017-10-12 NOTE — Progress Notes (Signed)
Alexandra Walker is a 62 y.o. female is here for follow up.  History of Present Illness:   Lonell Grandchild, CMA acting as scribe for Dr. Briscoe Deutscher.   HPI: Patient in office today for office visit. She has been having some flashes in both eyes and headache for over two weeks. She was seen by ophthalmology the day after it started. Flashes continued that and she started having occipital headaches daily with pain level of 4/10. Flashes stopped two days ago and was assured by eye doctor informed her this morning that this is normal. She had started Vyvanse right before she started having symptoms. She stopped about 10 days ago and symptoms did not change. She is still having the headache. She did go on trip and noticed that the symptoms started after trip. She has been having some neck pain and tension in neck with head ache. She would like to have referral to have second opnion by retina specialist. Vision: flashes have gone away and no changes in vision. She does have new floaters but was told that it would be normal for 3-6 months due to the vitreous detachment.  Head ache: Restriction is noticed with movement on right side of neck. She is interested in trying some alternative therapy. She will hold the Vyvanse until after she has been seen by retina specialist.   Health Maintenance Due  Topic Date Due  . OPHTHALMOLOGY EXAM  01/18/2017   Depression screen PHQ 2/9 10/28/2016  Decreased Interest 1  Down, Depressed, Hopeless 3  PHQ - 2 Score 4  Altered sleeping 2  Tired, decreased energy 2  Change in appetite 2  Feeling bad or failure about yourself  2  Trouble concentrating 3  Moving slowly or fidgety/restless 0  Suicidal thoughts 0  PHQ-9 Score 15   PMHx, SurgHx, SocialHx, FamHx, Medications, and Allergies were reviewed in the Visit Navigator and updated as appropriate.   Patient Active Problem List   Diagnosis Date Noted  . Vitreous floaters of both eyes 10/02/2017  . Recurrent  circadian rhythm sleep disorder, shift work type 03/31/2017  . Morbid obesity ([place]) 03/31/2017  . Cervical intraepithelial neoplasia grade 1 03/17/2017  . Female bladder prolapse 03/17/2017  . Irritable bowel syndrome 03/17/2017  . Class 2 obesity with serious comorbidity and body mass index (BMI) of 35.0 to 35.9 in adult 12/30/2016  . Circadian rhythm sleep disorder, shift work type 09/26/2016  . Fatty infiltration of liver 09/26/2016  . OSA on CPAP 09/26/2016  . PAF (paroxysmal atrial fibrillation) ([place]) 02/01/2016  . SVT (supraventricular tachycardia) ([place]) 01/31/2016  . Essential hypertension 01/31/2016  . Postmenopausal bleeding 10/03/2015  . LGSIL (low grade squamous intraepithelial dysplasia) 08/05/2015  . PMB (postmenopausal bleeding) 08/05/2015  . Dupuytren contracture 05/31/2015  . Elevated LFTs 05/31/2015  . Kyphosis, acquired 05/31/2015  . Premenstrual tension syndrome 05/31/2015  . Tenosynovitis of right foot 05/31/2015  . Allergic rhinitis 05/10/2015  . Cardiac dysrhythmia 05/10/2015  . Chronic depressive disorder 05/10/2015  . Edema 05/10/2015  . Gastroesophageal reflux disease without esophagitis 05/10/2015  . Hyperlipidemia associated with type 2 diabetes mellitus ([place]) 05/10/2015  . Microalbuminuria 05/10/2015  . Migraine headache without aura 05/10/2015  . Mixed hyperlipidemia 05/10/2015  . Vitamin D deficiency 05/10/2015  . PAC (premature atrial contraction) 09/19/2011   Social History   Tobacco Use  . Smoking status: Never Smoker  . Smokeless tobacco: Never Used  Substance Use Topics  . Alcohol use: Yes    Alcohol/week: 0.0  oz    Comment: social  . Drug use: No   Current Medications and Allergies:   Current Outpatient Medications:  .  aspirin 325 MG tablet, Take 325 mg by mouth daily., Disp: , Rfl:  .  Blood Glucose Monitoring Suppl (FREESTYLE LITE) DEVI, Check blood sugar 2-3 times per day.  Dx:E11.21, Disp: 1 each, Rfl: 0 .  buPROPion (WELLBUTRIN  SR) 150 MG 12 hr tablet, Take 1 tablet (150 mg total) by mouth daily., Disp: 30 tablet, Rfl: 10 .  cholecalciferol (VITAMIN D) 1000 UNITS tablet, Take 1,000 Units by mouth daily., Disp: , Rfl:  .  cyclobenzaprine (FLEXERIL) 5 MG tablet, Take 1 tablet (5 mg total) by mouth at bedtime as needed for muscle spasms., Disp: 30 tablet, Rfl: 2 .  diclofenac sodium (VOLTAREN) 1 % GEL, APPLY 2 GRAMS TOPICALLY 2 TIMES DAILY AS NEEDED. NOTE: 2 GRAMS IS EQUIVALENT TO A NICKEL SIZED AMOUNT., Disp: , Rfl:  .  glucose blood (FREESTYLE LITE) test strip, Check blood sugar 2-3 times per day.  Dx:E11.21., Disp: 100 each, Rfl: 12 .  ibuprofen (ADVIL,MOTRIN) 200 MG tablet, Take 200 mg by mouth every 6 (six) hours as needed., Disp: , Rfl:  .  Lancets (FREESTYLE) lancets, Check blood sugar 2-3 times per day.  Dx:E11.21, Disp: 100 each, Rfl: 12 .  lisdexamfetamine (VYVANSE) 10 MG capsule, Take 1 capsule (10 mg total) by mouth daily., Disp: 30 capsule, Rfl: 0 .  loratadine (CLARITIN) 10 MG tablet, Take by mouth., Disp: , Rfl:  .  losartan (COZAAR) 50 MG tablet, Take 1 tablet (50 mg total) by mouth daily., Disp: 30 tablet, Rfl: 5 .  metFORMIN (GLUCOPHAGE XR) 750 MG 24 hr tablet, Take two daily before breakfast., Disp: 180 tablet, Rfl: 2 .  methylPREDNISolone (MEDROL DOSEPAK) 4 MG TBPK tablet, Take by mouth as directed. Take 6 tablets on the first day prescribed then as directed., Disp: 21 tablet, Rfl: 0 .  metoprolol succinate (TOPROL-XL) 25 MG 24 hr tablet, Take 1 tablet (25 mg total) by mouth daily., Disp: 90 tablet, Rfl: 2 .  Misc. Devices (SCD SOFT SLEEVES/KNEE LENGTH) MISC, To be used at home., Disp: 2 each, Rfl: 1 .  Omega-3 Fatty Acids (FISH OIL PO), Take 1 tablet by mouth daily., Disp: , Rfl:  .  saccharomyces boulardii (FLORASTOR) 250 MG capsule, Take 1 capsule (250 mg total) by mouth 2 (two) times daily., Disp: 60 capsule, Rfl: 0 .  simvastatin (ZOCOR) 40 MG tablet, Take 1 tablet (40 mg total) by mouth every  evening., Disp: 30 tablet, Rfl: 5 .  sulfamethoxazole-trimethoprim (BACTRIM) 400-80 MG tablet, Take 1 tablet by mouth 2 (two) times daily., Disp: 20 tablet, Rfl: 0 .  Turmeric 450 MG CAPS, Take 900 mg by mouth daily., Disp: , Rfl:    Allergies  Allergen Reactions  . Ace Inhibitors Other (See Comments)    coughing  . Oxycodone-Acetaminophen Nausea And Vomiting  . Vicodin [Hydrocodone-Acetaminophen] Nausea And Vomiting  . Victoza [Liraglutide] Other (See Comments)    pancreatitis   Review of Systems   Pertinent items are noted in the HPI. Otherwise, ROS is negative.  Vitals:   Vitals:   10/14/17 0900  BP: 136/68  Pulse: 61  Temp: 98.6 F (37 C)  TempSrc: Oral  SpO2: 96%  Weight: 226 lb 6.4 oz (102.7 kg)  Height: 5\' 6"  (1.676 m)     Body mass index is 36.54 kg/m.  Physical Exam:   Physical Exam  Constitutional: She is oriented  to person, place, and time. She appears well-developed and well-nourished. No distress.  HENT:  Head: Normocephalic and atraumatic.  Right Ear: External ear normal.  Left Ear: External ear normal.  Nose: Nose normal.  Mouth/Throat: Oropharynx is clear and moist.  Eyes: Pupils are equal, round, and reactive to light. Conjunctivae and EOM are normal.  Neck: Normal range of motion. Neck supple. No thyromegaly present.  Cardiovascular: Normal rate, regular rhythm, normal heart sounds and intact distal pulses.  Pulmonary/Chest: Effort normal and breath sounds normal.  Abdominal: Soft. Bowel sounds are normal.  Musculoskeletal: Normal range of motion.  Lymphadenopathy:    She has no cervical adenopathy.  Neurological: She is alert and oriented to person, place, and time.  Skin: Skin is warm and dry. Capillary refill takes less than 2 seconds.  Psychiatric: She has a normal mood and affect. Her behavior is normal.  Nursing note and vitals reviewed.   Results for orders placed or performed in visit on 09/19/17  CBC with Differential/Platelet    Result Value Ref Range   WBC 4.9 4.0 - 10.5 K/uL   RBC 4.62 3.87 - 5.11 Mil/uL   Hemoglobin 12.9 12.0 - 15.0 g/dL   HCT 38.8 36.0 - 46.0 %   MCV 84.0 78.0 - 100.0 fl   MCHC 33.2 30.0 - 36.0 g/dL   RDW 13.2 11.5 - 15.5 %   Platelets 196.0 150.0 - 400.0 K/uL   Neutrophils Relative % 59.6 43.0 - 77.0 %   Lymphocytes Relative 28.3 12.0 - 46.0 %   Monocytes Relative 8.2 3.0 - 12.0 %   Eosinophils Relative 3.4 0.0 - 5.0 %   Basophils Relative 0.5 0.0 - 3.0 %   Neutro Abs 2.9 1.4 - 7.7 K/uL   Lymphs Abs 1.4 0.7 - 4.0 K/uL   Monocytes Absolute 0.4 0.1 - 1.0 K/uL   Eosinophils Absolute 0.2 0.0 - 0.7 K/uL   Basophils Absolute 0.0 0.0 - 0.1 K/uL  TSH  Result Value Ref Range   TSH 1.76 0.35 - 4.50 uIU/mL  Hemoglobin A1c  Result Value Ref Range   Hgb A1c MFr Bld 5.7 4.6 - 6.5 %  Lipid panel  Result Value Ref Range   Cholesterol 137 0 - 200 mg/dL   Triglycerides 99.0 0.0 - 149.0 mg/dL   HDL 44.90 >39.00 mg/dL   VLDL 19.8 0.0 - 40.0 mg/dL   LDL Cholesterol 72 0 - 99 mg/dL   Total CHOL/HDL Ratio 3    NonHDL 91.92   Comprehensive metabolic panel  Result Value Ref Range   Sodium 139 135 - 145 mEq/L   Potassium 4.2 3.5 - 5.1 mEq/L   Chloride 103 96 - 112 mEq/L   CO2 29 19 - 32 mEq/L   Glucose, Bld 100 (H) 70 - 99 mg/dL   BUN 17 6 - 23 mg/dL   Creatinine, Ser 0.74 0.40 - 1.20 mg/dL   Total Bilirubin 0.5 0.2 - 1.2 mg/dL   Alkaline Phosphatase 71 39 - 117 U/L   AST 12 0 - 37 U/L   ALT 10 0 - 35 U/L   Total Protein 6.8 6.0 - 8.3 g/dL   Albumin 4.1 3.5 - 5.2 g/dL   Calcium 9.5 8.4 - 10.5 mg/dL   GFR 85.22 >60.00 mL/min    Assessment and Plan:   Imaya was seen today for headache.  Diagnoses and all orders for this visit:  Vitreous floaters of both eyes -     Ambulatory referral to Ophthalmology  Cervicalgia -  Ambulatory referral to Physical Therapy   . Reviewed expectations re: course of current medical issues. . Discussed self-management of symptoms. . Outlined signs  and symptoms indicating need for more acute intervention. . Patient verbalized understanding and all questions were answered. Marland Kitchen Health Maintenance issues including appropriate healthy diet, exercise, and smoking avoidance were discussed with patient. . See orders for this visit as documented in the electronic medical record. . Patient received an After Visit Summary.  Briscoe Deutscher, DO [place] 10/14/2017  Future Appointments  Date Time Provider [place]  10/17/2017  2:20 PM Briscoe Deutscher, DO [facility]  11/05/2017  9:00 AM Imogene Burn, PA-C [facility]  12/16/2017  1:00 PM Briscoe Deutscher, DO [facility]  04/01/2018 10:30 AM Dohmeier, Asencion Partridge, MD [facility] None   CMA served as scribe during this visit. History, Physical, and Plan performed by medical provider. The above documentation has been reviewed and is accurate and complete. Briscoe Deutscher, D.O.

## 2017-10-14 ENCOUNTER — Telehealth: Payer: Self-pay | Admitting: Family Medicine

## 2017-10-14 ENCOUNTER — Encounter: Payer: Self-pay | Admitting: Family Medicine

## 2017-10-14 ENCOUNTER — Ambulatory Visit (INDEPENDENT_AMBULATORY_CARE_PROVIDER_SITE_OTHER): Payer: 59 | Admitting: Family Medicine

## 2017-10-14 VITALS — BP 136/68 | HR 61 | Temp 98.6°F | Ht 66.0 in | Wt 226.4 lb

## 2017-10-14 DIAGNOSIS — M542 Cervicalgia: Secondary | ICD-10-CM

## 2017-10-14 DIAGNOSIS — H43393 Other vitreous opacities, bilateral: Secondary | ICD-10-CM

## 2017-10-14 NOTE — Telephone Encounter (Signed)
FYI

## 2017-10-14 NOTE — Telephone Encounter (Signed)
"  Caller states she is seeing flashes of light and having headache for past 7-10 days. Every time she moves she see bright light." Per [facility]   Triage completed.

## 2017-10-16 ENCOUNTER — Encounter: Payer: Self-pay | Admitting: Physical Therapy

## 2017-10-16 ENCOUNTER — Encounter: Payer: Self-pay | Admitting: Family Medicine

## 2017-10-16 ENCOUNTER — Ambulatory Visit: Payer: 59 | Attending: Family Medicine | Admitting: Physical Therapy

## 2017-10-16 ENCOUNTER — Other Ambulatory Visit: Payer: Self-pay

## 2017-10-16 DIAGNOSIS — R519 Headache, unspecified: Secondary | ICD-10-CM

## 2017-10-16 DIAGNOSIS — R51 Headache: Secondary | ICD-10-CM | POA: Diagnosis not present

## 2017-10-16 DIAGNOSIS — R293 Abnormal posture: Secondary | ICD-10-CM | POA: Diagnosis not present

## 2017-10-16 DIAGNOSIS — M542 Cervicalgia: Secondary | ICD-10-CM | POA: Insufficient documentation

## 2017-10-16 NOTE — Therapy (Signed)
[facility] [address] [place], [place], [address] Phone: [phone]   Fax:  [phone]  Physical Therapy Evaluation  Patient Details  Name: Alexandra Walker MRN: [id number] Date of Birth: [birth year] Referring Provider: Dr. Briscoe Deutscher   Encounter Date: 10/16/2017  PT End of Session - 10/16/17 2129    Visit Number  1    Date for PT Re-Evaluation  12/11/17    Authorization Type  [facility]    PT Start Time  1145    PT Stop Time  1230    PT Time Calculation (min)  45 min    Activity Tolerance  Patient tolerated treatment well       Past Medical History:  Diagnosis Date  . 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 Arthritis   . Back pain   . Chronic depressive disorder   . Chronic edema    bilateral ankles  . Circadian rhythm sleep disorder   . Diabetes ([place])   . Fatty liver   . Ganglion of knee    left  . GERD (gastroesophageal reflux disease)    rare  . Glucose intolerance (impaired glucose tolerance)   . Hepatic steatosis   . HLA B27 (HLA B27 positive)   . Hx of pancreatitis   . Hyperlipidemia   . Hypertension   . IBS (irritable bowel syndrome)   . IBS (irritable bowel syndrome)   . Ischemic colitis ([place])   . Joint pain   . Kyphosis   . Lateral epicondylitis of right elbow   . Leg edema   . Lymphadenopathy   . Migraine headache    history of  . Osteoarthritis   . PAC (premature atrial contraction)    PAC, PJC, managed well with BB  . Paronychia   . Paroxysmal atrial fibrillation ([place])    a. 01/2016 - brief episode during adm for severe sepsis.  Marland Kitchen PMS (premenstrual syndrome)   . PONV (postoperative nausea and vomiting)   . Pyelonephritis 01/2016  . Sacroiliitis ([place])   . Seasonal allergies   . Sepsis ([place]) 01/2016  . Sleep hypopnea   . SVT (supraventricular tachycardia) ([place])    a. 01/2016 - SVT, wide complex tachycardia, and brief afib in setting of severe sepsis.  . Trigger ring finger of  left hand   . Vitamin D deficiency     Past Surgical History:  Procedure Laterality Date  . ABDOMINAL HYSTERECTOMY    . BLADDER SURGERY    . BREAST BIOPSY Left   . CERVICAL CONE BIOPSY    . CHOLECYSTECTOMY N/A 10/26/2014   Procedure: LAPAROSCOPIC CHOLECYSTECTOMY;  Surgeon: Coralie Keens, MD;  Location: [facility];  Service: General;  Laterality: N/A;  . COLONOSCOPY W/ BIOPSIES  multiple  . DIAGNOSTIC LAPAROSCOPY    . DILATION AND CURETTAGE OF UTERUS  07,11  . ESOPHAGOGASTRODUODENOSCOPY  multiple  . KNEE ARTHROSCOPY Left 11/2008  . LAPAROSCOPIC VAGINAL HYSTERECTOMY WITH SALPINGO OOPHORECTOMY Bilateral 10/03/2015   Procedure: LAPAROSCOPIC ASSISTED VAGINAL HYSTERECTOMY WITH SALPINGECTOMY;  Surgeon: Eldred Manges, MD;  Location: [facility];  Service: Gynecology;  Laterality: Bilateral;  . LEFT FINGER SURGERY    . MASS EXCISION Left 12/24/2012   Procedure: LEFT EXCISION OF PALMAR MASS X TWO;  Surgeon: Roseanne Kaufman, MD;  Location: [facility];  Service: Orthopedics;  Laterality: Left;    There were no vitals filed for this visit.   Subjective Assessment - 10/16/17 1154    Subjective  Visual issues  3 weeks ago along with headache;  no mechanism of injury;  dull ache in suboccipital region;  flashes stopped 3 days but HA continued;  has seen retinal doctor.  Computer neck when charting for a long time.  2-3 whiplashes but had PT, no long lasting pain.  Sleeping better with new feather pillows.  CPAP at night.      Pertinent History  hx of migraines before menopause; congenital kyphosis    Limitations  House hold activities    Diagnostic tests  none    Patient Stated Goals  get HA to go away    Currently in Pain?  Yes    Pain Score  2     Pain Location  Neck    Pain Type  Chronic pain    Pain Onset  1 to 4 weeks ago    Pain Frequency  Intermittent    Aggravating Factors   out of the blue; no patterns    Pain Relieving Factors  Ibuprofen         [facility]  PT Assessment - 10/16/17 0001      Assessment   Medical Diagnosis  cervicalgia    Referring Provider  Dr. Briscoe Deutscher    Onset Date/Surgical Date  -- 3 weeks    Hand Dominance  Right    Next MD Visit  tomorrow    Prior Therapy  2-3 times for neck;  had acupunture and cupping      Precautions   Precautions  None      Restrictions   Weight Bearing Restrictions  No      Balance Screen   Has the patient fallen in the past 6 months  Yes    How many times?  tripped 3x this year    Has the patient had a decrease in activity level because of a fear of falling?   No    Is the patient reluctant to leave their home because of a fear of falling?   No      Home Environment   Living Environment  Private residence    Living Arrangements  -- college age child home for the summer      Prior Function   Vocation  Full time employment    Vocation Requirements  [employer]       Observation/Other Assessments   Focus on Therapeutic Outcomes (FOTO)   30% limitation       Posture/Postural Control   Posture/Postural Control  Postural limitations    Postural Limitations  Rounded Shoulders;Forward head;Increased thoracic kyphosis      AROM   AROM Assessment Site  -- full UE ROM    Cervical Flexion  65    Cervical Extension  60    Cervical - Right Side Bend  36    Cervical - Left Side Bend  43    Cervical - Right Rotation  40    Cervical - Left Rotation  35      Strength   Strength Assessment Site  -- strength grossly 4+/5    Cervical Flexion  4/5    Cervical Extension  4/5      Palpation   Palpation comment  tender points in bil suboccipitals, cervical paraspinals and upper traps      Distraction Test   Findngs  Positive                Objective measurements completed on examination: See above findings.  PT Education - 10/16/17 2129    Education Details  cervical retractions seated and supine and DN info    Person(s) Educated  Patient     Methods  Explanation;Handout;Demonstration    Comprehension  Returned demonstration;Verbalized understanding       PT Short Term Goals - 10/16/17 2139      PT SHORT TERM GOAL #1   Title  The patient will demonstrate awareness of postural correction to decrease head forward posture at work and home    Time  4    Period  Weeks    Status  New      PT SHORT TERM GOAL #2   Title  The patient will report a 30% improvement in headaches    Time  4    Period  Weeks    Status  New      PT SHORT TERM GOAL #3   Title  The patient will have improved right sidebending ROM to 40 degrees and left rotation to 40 degrees needed for driving and work duties    Time  4    Period  Weeks    Status  New        PT Long Term Goals - 10/16/17 2142      PT LONG TERM GOAL #1   Title  The patient will be independent in safe self progression of HEP and self care strategies    Time  8    Period  Weeks    Status  New    Target Date  12/11/17      PT LONG TERM GOAL #2   Title  The patient will report a 60% improvement in headaches     Time  8    Period  Weeks    Status  New      PT LONG TERM GOAL #3   Title  The patient will have cervical and thoracic muscle strength grossly 4+/5 to 5-/5 needed improved postural alignment     Time  8    Period  Weeks    Status  New      PT LONG TERM GOAL #4   Title  FOTO functional outcome score improved from 30% limitation to 28% limitation     Time  8    Period  Weeks    Status  New             Plan - 10/16/17 2130    Clinical Impression Statement  The patient reports a 3 week history of visual changes and posterior headache.  She has seen an eye doctor and it was felt that these changes could be coming from her cervical region.  She has a history of off/on neck pain over the years.  She has moderate head forward and rounded shoulders.  Decreased sidebending to the right and decreased left rotation ROM.  Marked tender points in suboccipitals,  paraspinals and upper traps.  Decreased cervical and upper thoracic strength especially extensor muscles.  Functional limitations with using the computer for work tasks.      History and Personal Factors relevant to plan of care:  numerous co-morbidities including autoimmune disorder, LBP with sciatica, hx of chronic neck pain, HTN, depression, sleep dysfunction; limited home support    Clinical Presentation  Evolving    Clinical Presentation due to:  worsening of symptoms over time    Clinical Decision Making  Moderate    Rehab Potential  Good    Clinical Impairments Affecting Rehab  Potential  see co-morbidities above    PT Frequency  2x / week    PT Duration  8 weeks    PT Treatment/Interventions  ADLs/Self Care Home Management;Cryotherapy;Electrical Stimulation;Ultrasound;Traction;Moist Heat;Neuromuscular re-education;Therapeutic activities;Therapeutic exercise;Patient/family education;Manual techniques;Dry needling;Taping    PT Next Visit Plan  DN suboccipitals, cervical multifidi, upper traps;  manual techniques;  progress HEP;  give headache ex handout;  manual traction;  modalities as needed    Consulted and Agree with Plan of Care  Patient       Patient will benefit from skilled therapeutic intervention in order to improve the following deficits and impairments:  Pain, Postural dysfunction, Increased muscle spasms, Increased fascial restricitons, Decreased range of motion, Decreased strength  Visit Diagnosis: Acute nonintractable headache, unspecified headache type - Plan: PT plan of care cert/re-cert  Cervicalgia - Plan: PT plan of care cert/re-cert  Abnormal posture - Plan: PT plan of care cert/re-cert     Problem List Patient Active Problem List   Diagnosis Date Noted  . Vitreous floaters of both eyes 10/02/2017  . Recurrent circadian rhythm sleep disorder, shift work type 03/31/2017  . Morbid obesity ([place]) 03/31/2017  . Cervical intraepithelial neoplasia grade 1  03/17/2017  . Female bladder prolapse 03/17/2017  . Irritable bowel syndrome 03/17/2017  . Class 2 obesity with serious comorbidity and body mass index (BMI) of 35.0 to 35.9 in adult 12/30/2016  . Circadian rhythm sleep disorder, shift work type 09/26/2016  . Fatty infiltration of liver 09/26/2016  . OSA on CPAP 09/26/2016  . PAF (paroxysmal atrial fibrillation) ([place]) 02/01/2016  . SVT (supraventricular tachycardia) ([place]) 01/31/2016  . Essential hypertension 01/31/2016  . Postmenopausal bleeding 10/03/2015  . LGSIL (low grade squamous intraepithelial dysplasia) 08/05/2015  . PMB (postmenopausal bleeding) 08/05/2015  . Dupuytren contracture 05/31/2015  . Elevated LFTs 05/31/2015  . Kyphosis, acquired 05/31/2015  . Premenstrual tension syndrome 05/31/2015  . Tenosynovitis of right foot 05/31/2015  . Allergic rhinitis 05/10/2015  . Cardiac dysrhythmia 05/10/2015  . Chronic depressive disorder 05/10/2015  . Edema 05/10/2015  . Gastroesophageal reflux disease without esophagitis 05/10/2015  . Hyperlipidemia associated with type 2 diabetes mellitus ([place]) 05/10/2015  . Microalbuminuria 05/10/2015  . Migraine headache without aura 05/10/2015  . Mixed hyperlipidemia 05/10/2015  . Vitamin D deficiency 05/10/2015  . PAC (premature atrial contraction) 09/19/2011   Ruben Im, PT 10/16/17 9:48 PM Phone: [phone] Fax: [phone]  Alvera Singh 10/16/2017, 9:48 PM  [facility] [address] [place], [place], [address] Phone: [phone]   Fax:  [phone]  Name: MARIJA CALAMARI MRN: [id number] Date of Birth: [birth year]

## 2017-10-16 NOTE — Patient Instructions (Addendum)
  Extensors, Supine   Lie supine, head on small, rolled towel. Gently tuck chin and bring toward chest. Hold _2__ seconds. Repeat _8__ times per session. Do __1_ sessions per day.  Copyright  VHI. All rights reserved.  Flexibility: Neck Retraction   Pull head straight back, keeping eyes and jaw level. Repeat __8__ times per set. Do ___1_ sets per session. Do _4___ sessions per day.  http://orth.exer.us/344   Copyright  VHI. All rights reserved.      Ruben Im PT [facility] [address] [place], [place] [address] Phone # [phone] Fax [phone]

## 2017-10-17 ENCOUNTER — Telehealth: Payer: Self-pay

## 2017-10-17 ENCOUNTER — Encounter: Payer: Self-pay | Admitting: Family Medicine

## 2017-10-17 ENCOUNTER — Ambulatory Visit: Payer: 59 | Admitting: Family Medicine

## 2017-10-17 ENCOUNTER — Ambulatory Visit (INDEPENDENT_AMBULATORY_CARE_PROVIDER_SITE_OTHER): Payer: 59 | Admitting: Family Medicine

## 2017-10-17 VITALS — BP 132/84 | HR 61 | Temp 97.9°F | Ht 66.0 in | Wt 230.8 lb

## 2017-10-17 DIAGNOSIS — F902 Attention-deficit hyperactivity disorder, combined type: Secondary | ICD-10-CM

## 2017-10-17 DIAGNOSIS — I480 Paroxysmal atrial fibrillation: Secondary | ICD-10-CM

## 2017-10-17 DIAGNOSIS — R300 Dysuria: Secondary | ICD-10-CM

## 2017-10-17 DIAGNOSIS — Z0000 Encounter for general adult medical examination without abnormal findings: Secondary | ICD-10-CM

## 2017-10-17 LAB — POCT URINALYSIS DIPSTICK
Bilirubin, UA: NEGATIVE
Blood, UA: NEGATIVE
Glucose, UA: NEGATIVE
Ketones, UA: NEGATIVE
Leukocytes, UA: NEGATIVE
Nitrite, UA: POSITIVE
Protein, UA: NEGATIVE
Spec Grav, UA: >=1.03 — AB (ref 1.010–1.025)
Urobilinogen, UA: 0.2 E.U./dL
pH, UA: 5.5 (ref 5.0–8.0)

## 2017-10-17 MED ORDER — LISDEXAMFETAMINE DIMESYLATE 20 MG PO CAPS: 20.0000 mg | ORAL_CAPSULE | Freq: Every day | ORAL | 0 refills | Status: DC

## 2017-10-17 NOTE — Telephone Encounter (Signed)
Copied from [place] [id number]. Topic: General - Other >> Oct 17, 2017  9:38 AM Self, Lillette Boxer wrote: Called patient because she has an appointment with Dr. Juleen China today, however she was just seen on Tuesday. I called to see if she still wants to see Dr. Juleen China today. If she does, we still have her appointment, and if she wants to cancel, there will  NOT be a fee.  >> Oct 17, 2017 10:46 AM Scherrie Gerlach wrote: Pt calling back to advise she thought this was her annual cpe, and needs for her [employer] insurance. Pt would like a call back to let her know for sure if she to keep this appt.or if her Dec appt will be enough to satisfy her requirement

## 2017-10-17 NOTE — Telephone Encounter (Signed)
Spoke to pt and advised to keep appt today for annual cpe.

## 2017-10-17 NOTE — Progress Notes (Signed)
Subjective:    Alexandra Walker is a 62 y.o. female and is here for a comprehensive physical exam.  Health Maintenance Due  Topic Date Due  . OPHTHALMOLOGY EXAM  01/18/2017   PMHx, SurgHx, SocialHx, Medications, and Allergies were reviewed in the Visit Navigator and updated as appropriate.   Past Medical History:  Diagnosis Date  . 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[place])   . Arthritis   . Back pain   . Chronic depressive disorder   . Chronic edema    bilateral ankles  . Circadian rhythm sleep disorder   . Diabetes ([place])   . Fatty liver   . Ganglion of knee    left  . GERD (gastroesophageal reflux disease)    rare  . Glucose intolerance (impaired glucose tolerance)   . Hepatic steatosis   . HLA B27 (HLA B27 positive)   . Hx of pancreatitis   . Hyperlipidemia   . Hypertension   . IBS (irritable bowel syndrome)   . IBS (irritable bowel syndrome)   . Ischemic colitis ([place])   . Joint pain   . Kyphosis   . Lateral epicondylitis of right elbow   . Leg edema   . Lymphadenopathy   . Migraine headache    history of  . Osteoarthritis   . PAC (premature atrial contraction)    PAC, PJC, managed well with BB  . Paronychia   . Paroxysmal atrial fibrillation ([place])    a. 01/2016 - brief episode during adm for severe sepsis.  Marland Kitchen PMS (premenstrual syndrome)   . PONV (postoperative nausea and vomiting)   . Pyelonephritis 01/2016  . Sacroiliitis ([place])   . Seasonal allergies   . Sepsis ([place]) 01/2016  . Sleep hypopnea   . SVT (supraventricular tachycardia) ([place])    a. 01/2016 - SVT, wide complex tachycardia, and brief afib in setting of severe sepsis.  . Trigger ring finger of left hand   . Vitamin D deficiency     Past Surgical History:  Procedure Laterality Date  . ABDOMINAL HYSTERECTOMY    . BLADDER SURGERY    . BREAST BIOPSY Left   . CERVICAL CONE BIOPSY    . CHOLECYSTECTOMY N/A 10/26/2014   Procedure: LAPAROSCOPIC CHOLECYSTECTOMY;  Surgeon: Coralie Keens, MD;  Location:  [facility];  Service: General;  Laterality: N/A;  . COLONOSCOPY W/ BIOPSIES  multiple  . DIAGNOSTIC LAPAROSCOPY    . DILATION AND CURETTAGE OF UTERUS  07,11  . ESOPHAGOGASTRODUODENOSCOPY  multiple  . KNEE ARTHROSCOPY Left 11/2008  . LAPAROSCOPIC VAGINAL HYSTERECTOMY WITH SALPINGO OOPHORECTOMY Bilateral 10/03/2015   Procedure: LAPAROSCOPIC ASSISTED VAGINAL HYSTERECTOMY WITH SALPINGECTOMY;  Surgeon: Eldred Manges, MD;  Location: [facility];  Service: Gynecology;  Laterality: Bilateral;  . LEFT FINGER SURGERY    . MASS EXCISION Left 12/24/2012   Procedure: LEFT EXCISION OF PALMAR MASS X TWO;  Surgeon: Roseanne Kaufman, MD;  Location: [facility];  Service: Orthopedics;  Laterality: Left;    Family History  Problem Relation Age of Onset  . Heart attack Father   . Heart disease Father   . Coronary artery disease Mother   . Hypertension Mother   . Heart disease Mother   . Heart attack Mother   . Diabetes Mother   . Hyperlipidemia Mother   . Kidney disease Mother   . Depression Mother   . Sleep apnea Mother   . Obesity Mother   . Hypertension Sister   . Hypertension Brother   . Colon cancer  Neg Hx    Social History   Tobacco Use  . Smoking status: Never Smoker  . Smokeless tobacco: Never Used  Substance Use Topics  . Alcohol use: Yes    Alcohol/week: 0.0 oz    Comment: social  . Drug use: No    Review of Systems:   Pertinent items are noted in the HPI. Otherwise, ROS is negative.  Objective:   BP 132/84   Pulse 61   Temp 97.9 F (36.6 C) (Oral)   Ht _0  (1.676 m)   Wt 230 lb 12.8 oz (104.7 kg)   LMP 05/20/2008   SpO2 98%   BMI 37.25 kg/m   Wt Readings from Last 3 Encounters:  10/17/17 230 lb 12.8 oz (104.7 kg)  10/14/17 226 lb 6.4 oz (102.7 kg)  09/19/17 228 lb 12.8 oz (103.8 kg)     Ht Readings from Last 3 Encounters:  10/17/17 _1  (1.676 m)  10/14/17 _2  (1.676 m)  09/19/17 _3  (1.676 m)    General appearance: alert,  cooperative and appears stated age. Head: normocephalic, without obvious abnormality, atraumatic. Neck: no adenopathy, supple, symmetrical, trachea midline; thyroid not enlarged, symmetric, no tenderness/mass/nodules. Lungs: clear to auscultation bilaterally. Heart: regular rate and rhythm Abdomen: soft, non-tender; no masses,  no organomegaly. Extremities: extremities normal, atraumatic, no cyanosis or edema. Skin: skin color, texture, turgor normal, no rashes or lesions. Lymph: cervical, supraclavicular, and axillary nodes normal; no abnormal inguinal nodes palpated. Neurologic: grossly normal.  Assessment/Plan:   Alexandra Walker was seen today for annual exam.  Diagnoses and all orders for this visit:  Routine physical examination  Dysuria -     POCT urinalysis dipstick  Attention deficit hyperactivity disorder (ADHD), combined type -     lisdexamfetamine (VYVANSE) 20 MG capsule; Take 1 capsule (20 mg total) by mouth daily before breakfast. -     lisdexamfetamine (VYVANSE) 20 MG capsule; Take 1 capsule (20 mg total) by mouth daily before breakfast. -     lisdexamfetamine (VYVANSE) 20 MG capsule; Take 1 capsule (20 mg total) by mouth daily before breakfast.  Paroxysmal atrial fibrillation (HCC) Comments: No concerns in years.    Patient Counseling: _4    Nutrition: Stressed importance of moderation in sodium/caffeine intake, saturated fat and cholesterol, caloric balance, sufficient intake of fresh fruits, vegetables, fiber, calcium, iron, and 1 mg of folate supplement per day (for females capable of pregnancy).  _5    Stressed the importance of regular exercise.   _6    Substance Abuse: Discussed cessation/primary prevention of tobacco, alcohol, or other drug use; driving or other dangerous activities under the influence; availability of treatment for abuse.   _7    Injury prevention: Discussed safety belts, safety helmets, smoke detector, smoking near bedding or upholstery.   _8     Sexuality: Discussed sexually transmitted diseases, partner selection, use of condoms, avoidance of unintended pregnancy  and contraceptive alternatives.  _9    Dental health: Discussed importance of regular tooth brushing, flossing, and dental visits.  _10    Health maintenance and immunizations reviewed. Please refer to Health maintenance section.   Briscoe Deutscher, DO [place]

## 2017-10-17 NOTE — Telephone Encounter (Signed)
They should be contacting the patient, it was sent to them on 10/14/17.

## 2017-10-19 ENCOUNTER — Encounter: Payer: Self-pay | Admitting: Family Medicine

## 2017-10-19 DIAGNOSIS — F902 Attention-deficit hyperactivity disorder, combined type: Secondary | ICD-10-CM | POA: Insufficient documentation

## 2017-10-21 ENCOUNTER — Encounter: Payer: Self-pay | Admitting: Physical Therapy

## 2017-10-21 ENCOUNTER — Ambulatory Visit: Payer: 59 | Attending: Family Medicine | Admitting: Physical Therapy

## 2017-10-21 DIAGNOSIS — R51 Headache: Secondary | ICD-10-CM | POA: Diagnosis not present

## 2017-10-21 DIAGNOSIS — M542 Cervicalgia: Secondary | ICD-10-CM | POA: Diagnosis not present

## 2017-10-21 DIAGNOSIS — R293 Abnormal posture: Secondary | ICD-10-CM | POA: Diagnosis not present

## 2017-10-21 DIAGNOSIS — R519 Headache, unspecified: Secondary | ICD-10-CM

## 2017-10-21 NOTE — Therapy (Signed)
[facility] [address] [place], [place], [address] Phone: [phone]   Fax:  [phone]  Physical Therapy Treatment  Patient Details  Name: Alexandra Walker MRN: [id number] Date of Birth: 1955-10-11 Referring Provider: Dr. Briscoe Deutscher   Encounter Date: 10/21/2017  PT End of Session - 10/21/17 2113    Visit Number  2    Date for PT Re-Evaluation  12/11/17    Authorization Type  UMR    PT Start Time  1400    PT Stop Time  1450    PT Time Calculation (min)  50 min    Activity Tolerance  Patient tolerated treatment well       Past Medical History:  Diagnosis Date  . 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 Arthritis   . Back pain   . Chronic depressive disorder   . Chronic edema    bilateral ankles  . Circadian rhythm sleep disorder   . Diabetes ([place])   . Fatty liver   . Ganglion of knee    left  . GERD (gastroesophageal reflux disease)    rare  . Glucose intolerance (impaired glucose tolerance)   . Hepatic steatosis   . HLA B27 (HLA B27 positive)   . Hx of pancreatitis   . Hyperlipidemia   . Hypertension   . IBS (irritable bowel syndrome)   . IBS (irritable bowel syndrome)   . Ischemic colitis ([place])   . Joint pain   . Kyphosis   . Lateral epicondylitis of right elbow   . Leg edema   . Lymphadenopathy   . Migraine headache    history of  . Osteoarthritis   . PAC (premature atrial contraction)    PAC, PJC, managed well with BB  . Paronychia   . Paroxysmal atrial fibrillation ([place])    a. 01/2016 - brief episode during adm for severe sepsis.  Marland Kitchen PMS (premenstrual syndrome)   . PONV (postoperative nausea and vomiting)   . Pyelonephritis 01/2016  . Sacroiliitis ([place])   . Seasonal allergies   . Sepsis ([place]) 01/2016  . Sleep hypopnea   . SVT (supraventricular tachycardia) ([place])    a. 01/2016 - SVT, wide complex tachycardia, and brief afib in setting of severe sepsis.  . Trigger ring finger of left  hand   . Vitamin D deficiency     Past Surgical History:  Procedure Laterality Date  . ABDOMINAL HYSTERECTOMY    . BLADDER SURGERY    . BREAST BIOPSY Left   . CERVICAL CONE BIOPSY    . CHOLECYSTECTOMY N/A 10/26/2014   Procedure: LAPAROSCOPIC CHOLECYSTECTOMY;  Surgeon: Coralie Keens, MD;  Location: [facility];  Service: General;  Laterality: N/A;  . COLONOSCOPY W/ BIOPSIES  multiple  . DIAGNOSTIC LAPAROSCOPY    . DILATION AND CURETTAGE OF UTERUS  07,11  . ESOPHAGOGASTRODUODENOSCOPY  multiple  . KNEE ARTHROSCOPY Left 11/2008  . LAPAROSCOPIC VAGINAL HYSTERECTOMY WITH SALPINGO OOPHORECTOMY Bilateral 10/03/2015   Procedure: LAPAROSCOPIC ASSISTED VAGINAL HYSTERECTOMY WITH SALPINGECTOMY;  Surgeon: Eldred Manges, MD;  Location: [facility];  Service: Gynecology;  Laterality: Bilateral;  . LEFT FINGER SURGERY    . MASS EXCISION Left 12/24/2012   Procedure: LEFT EXCISION OF PALMAR MASS X TWO;  Surgeon: Roseanne Kaufman, MD;  Location: [facility];  Service: Orthopedics;  Laterality: Left;    There were no vitals filed for this visit.  Subjective Assessment - 10/21/17 1403    Subjective  No changes.  Waiting to get into seeing the retina doctor.  Mild posterior Headaches.  Still with eye flashes .      Currently in Pain?  Yes    Pain Score  3     Pain Location  Head    Pain Orientation  Right;Left right > left    Pain Type  Chronic pain                       [facility] Adult PT Treatment/Exercise - 10/21/17 0001      Self-Care   Self-Care  ADL's    ADL's  use of small cervical roll for sleep       Moist Heat Therapy   Number Minutes Moist Heat  15 Minutes    Moist Heat Location  Cervical      Electrical Stimulation   Electrical Stimulation Location  cervical     Electrical Stimulation Action  IFC    Electrical Stimulation Parameters  8 ma 15 min supine    Electrical Stimulation Goals  Pain      Manual Therapy   Manual Therapy  Manual  Traction;Muscle Energy Technique    Soft tissue mobilization  cervical paraspinals, suboccipitals    Manual Traction  3x 30 sec    Muscle Energy Technique  upper trap contract/relax 3x 5 sec holds bil      Neck Exercises: Stretches   Other Neck Stretches  "headache" SNAG ex's per HEP        Trigger Point Dry Needling - 10/21/17 2112    Consent Given?  Yes    Education Handout Provided  Yes    Muscles Treated Upper Body  Upper trapezius;Suboccipitals muscle group;Levator scapulae    Upper Trapezius Response  Twitch reponse elicited;Palpable increased muscle length    SubOccipitals Response  Twitch response elicited;Palpable increased muscle length    Levator Scapulae Response  Twitch response elicited;Palpable increased muscle length       Performed bilaterally    PT Education - 10/21/17 1442    Education Details  headache exercises;  home TENS info;  cervical towel roll for sleeping    Person(s) Educated  Patient    Methods  Explanation;Handout;Demonstration    Comprehension  Verbalized understanding;Returned demonstration       PT Short Term Goals - 10/16/17 2139      PT SHORT TERM GOAL #1   Title  The patient will demonstrate awareness of postural correction to decrease head forward posture at work and home    Time  4    Period  Weeks    Status  New      PT SHORT TERM GOAL #2   Title  The patient will report a 30% improvement in headaches    Time  4    Period  Weeks    Status  New      PT SHORT TERM GOAL #3   Title  The patient will have improved right sidebending ROM to 40 degrees and left rotation to 40 degrees needed for driving and work duties    Time  4    Period  Weeks    Status  New        PT Long Term Goals - 10/16/17 2142      PT LONG TERM GOAL #1   Title  The patient will be independent in safe self progression of HEP and self care strategies    Time  8    Period  Weeks  Status  New    Target Date  12/11/17      PT LONG TERM GOAL #2    Title  The patient will report a 60% improvement in headaches     Time  8    Period  Weeks    Status  New      PT LONG TERM GOAL #3   Title  The patient will have cervical and thoracic muscle strength grossly 4+/5 to 5-/5 needed improved postural alignment     Time  8    Period  Weeks    Status  New      PT LONG TERM GOAL #4   Title  FOTO functional outcome score improved from 30% limitation to 28% limitation     Time  8    Period  Weeks    Status  New            Plan - 10/21/17 2113    Clinical Impression Statement  The patient is very interested in trying dry needling for her recent exacerbation of posterior headaches.  She has multiple tender points in right > left suboccipitals and right > left upper trapezius. Numerous twitch responses elicited which is a good prognostic indicator.  Following DN and manual therapy much improve soft tissue lengths of extensibility noted.  Encouraged continuation of cervical retractions and instructed in self stretches for upper cervical spine specifically helpful for headaches.  Good relief with ES/heat and patient expresses interest in getting a home unit.      Rehab Potential  Good    Clinical Impairments Affecting Rehab Potential  see co-morbidities above    PT Frequency  2x / week    PT Duration  8 weeks    PT Treatment/Interventions  ADLs/Self Care Home Management;Cryotherapy;Electrical Stimulation;Ultrasound;Traction;Moist Heat;Neuromuscular re-education;Therapeutic activities;Therapeutic exercise;Patient/family education;Manual techniques;Dry needling;Taping    PT Next Visit Plan  assess response to DN suboccipitals, cervical multifidi, upper traps #1;  manual techniques;  progress HEP including isometrics or deep cervical flexor strengthening for "head heaviness";    manual traction;  modalities as needed       Patient will benefit from skilled therapeutic intervention in order to improve the following deficits and impairments:  Pain,  Postural dysfunction, Increased muscle spasms, Increased fascial restricitons, Decreased range of motion, Decreased strength  Visit Diagnosis: Acute nonintractable headache, unspecified headache type  Cervicalgia  Abnormal posture     Problem List Patient Active Problem List   Diagnosis Date Noted  . Attention deficit hyperactivity disorder (ADHD), combined type 10/19/2017  . Vitreous floaters of both eyes 10/02/2017  . Recurrent circadian rhythm sleep disorder, shift work type 03/31/2017  . Morbid obesity ([place]) 03/31/2017  . Cervical intraepithelial neoplasia grade 1 03/17/2017  . Female bladder prolapse 03/17/2017  . Irritable bowel syndrome 03/17/2017  . Class 2 obesity with serious comorbidity and body mass index (BMI) of 35.0 to 35.9 in adult 12/30/2016  . Circadian rhythm sleep disorder, shift work type 09/26/2016  . Fatty infiltration of liver 09/26/2016  . OSA on CPAP 09/26/2016  . PAF (paroxysmal atrial fibrillation) ([place]) 02/01/2016  . SVT (supraventricular tachycardia) ([place]) 01/31/2016  . Essential hypertension 01/31/2016  . Postmenopausal bleeding 10/03/2015  . LGSIL (low grade squamous intraepithelial dysplasia) 08/05/2015  . PMB (postmenopausal bleeding) 08/05/2015  . Dupuytren contracture 05/31/2015  . Elevated LFTs 05/31/2015  . Kyphosis, acquired 05/31/2015  . Premenstrual tension syndrome 05/31/2015  . Tenosynovitis of right foot 05/31/2015  . Allergic rhinitis 05/10/2015  . Cardiac  dysrhythmia 05/10/2015  . Chronic depressive disorder 05/10/2015  . Edema 05/10/2015  . Gastroesophageal reflux disease without esophagitis 05/10/2015  . Hyperlipidemia associated with type 2 diabetes mellitus ([place]) 05/10/2015  . Microalbuminuria 05/10/2015  . Migraine headache without aura 05/10/2015  . Mixed hyperlipidemia 05/10/2015  . Vitamin D deficiency 05/10/2015  . PAC (premature atrial contraction) 09/19/2011   Ruben Im, PT 10/21/17 9:22 PM Phone:  [phone] Fax: [phone]  Alvera Singh 10/21/2017, Sandi Mariscal PM  [facility] [address], [place], [place], [address] Phone: [phone]   Fax:  [phone]  Name: JAMISHA HOESCHEN MRN: [id number] Date of Birth: 1955/10/10

## 2017-10-21 NOTE — Patient Instructions (Addendum)
         Trigger Point Dry Needling  . What is Trigger Point Dry Needling (DN)? o DN is a physical therapy technique used to treat muscle pain and dysfunction. Specifically, DN helps deactivate muscle trigger points (muscle knots).  o A thin filiform needle is used to penetrate the skin and stimulate the underlying trigger point. The goal is for a local twitch response (LTR) to occur and for the trigger point to relax. No medication of any kind is injected during the procedure.   . What Does Trigger Point Dry Needling Feel Like?  o The procedure feels different for each individual patient. Some patients report that they do not actually feel the needle enter the skin and overall the process is not painful. Very mild bleeding may occur. However, many patients feel a deep cramping in the muscle in which the needle was inserted. This is the local twitch response.   Marland Kitchen How Will I feel after the treatment? o Soreness is normal, and the onset of soreness may not occur for a few hours. Typically this soreness does not last longer than two days.  o Bruising is uncommon, however; ice can be used to decrease any possible bruising.  o In rare cases feeling tired or nauseous after the treatment is normal. In addition, your symptoms may get worse before they get better, this period will typically not last longer than 24 hours.   . What Can I do After My Treatment? o Increase your hydration by drinking more water for the next 24 hours. o You may place ice or heat on the areas treated that have become sore, however, do not use heat on inflamed or bruised areas. Heat often brings more relief post needling. o You can continue your regular activities, but vigorous activity is not recommended initially after the treatment for 24 hours. o DN is best combined with other physical therapy such as strengthening, stretching, and other therapies.     TENS UNIT  This is helpful for muscle pain and spasm.    Search and Purchase a TENS 7000 2nd edition at www.tenspros.com or www.amazon.com  (It should be less than $30)     TENS unit instructions:   Do not shower or bathe with the unit on  Turn the unit off before removing electrodes or batteries  If the electrodes lose stickiness add a drop of water to the electrodes after they are disconnected from the unit and place on plastic sheet. If you continued to have difficulty, call the TENS unit company to purchase more electrodes.  Do not apply lotion on the skin area prior to use. Make sure the skin is clean and dry as this will help prolong the life of the electrodes.  After use, always check skin for unusual red areas, rash or other skin difficulties. If there are any skin problems, does not apply electrodes to the same area.  Never remove the electrodes from the unit by pulling the wires.  Do not use the TENS unit or electrodes other than as directed.  Do not change electrode placement without consulting your therapist or physician.  Keep 2 fingers with between each electrode.      Ruben Im PT [facility] [address], [place], [place] [address] Phone # [phone] Fax [phone]

## 2017-10-23 ENCOUNTER — Encounter: Payer: Self-pay | Admitting: Physical Therapy

## 2017-10-23 ENCOUNTER — Ambulatory Visit: Payer: 59 | Admitting: Physical Therapy

## 2017-10-23 DIAGNOSIS — M542 Cervicalgia: Secondary | ICD-10-CM | POA: Diagnosis not present

## 2017-10-23 DIAGNOSIS — R519 Headache, unspecified: Secondary | ICD-10-CM

## 2017-10-23 DIAGNOSIS — R293 Abnormal posture: Secondary | ICD-10-CM

## 2017-10-23 DIAGNOSIS — R51 Headache: Secondary | ICD-10-CM | POA: Diagnosis not present

## 2017-10-23 NOTE — Therapy (Signed)
[facility] [address], [place], [place], [address] Phone: [phone]   Fax:  [phone]  Physical Therapy Treatment  Patient Details  Name: Alexandra Walker MRN: [id number] Date of Birth: 09-08-1955 Referring Provider: Dr. Briscoe Deutscher   Encounter Date: 10/23/2017  PT End of Session - 10/23/17 1547    Visit Number  3    Date for PT Re-Evaluation  12/11/17    Authorization Type  UMR    PT Start Time  1530    PT Stop Time  1615    PT Time Calculation (min)  45 min    Activity Tolerance  Patient tolerated treatment well       Past Medical History:  Diagnosis Date  . 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[place])   . Arthritis   . Back pain   . Chronic depressive disorder   . Chronic edema    bilateral ankles  . Circadian rhythm sleep disorder   . Diabetes ([place])   . Fatty liver   . Ganglion of knee    left  . GERD (gastroesophageal reflux disease)    rare  . Glucose intolerance (impaired glucose tolerance)   . Hepatic steatosis   . HLA B27 (HLA B27 positive)   . Hx of pancreatitis   . Hyperlipidemia   . Hypertension   . IBS (irritable bowel syndrome)   . IBS (irritable bowel syndrome)   . Ischemic colitis ([place])   . Joint pain   . Kyphosis   . Lateral epicondylitis of right elbow   . Leg edema   . Lymphadenopathy   . Migraine headache    history of  . Osteoarthritis   . PAC (premature atrial contraction)    PAC, PJC, managed well with BB  . Paronychia   . Paroxysmal atrial fibrillation ([place])    a. 01/2016 - brief episode during adm for severe sepsis.  Marland Kitchen PMS (premenstrual syndrome)   . PONV (postoperative nausea and vomiting)   . Pyelonephritis 01/2016  . Sacroiliitis ([place])   . Seasonal allergies   . Sepsis ([place]) 01/2016  . Sleep hypopnea   . SVT (supraventricular tachycardia) ([place])    a. 01/2016 - SVT, wide complex tachycardia, and brief afib in setting of severe sepsis.  . Trigger ring finger of left  hand   . Vitamin D deficiency     Past Surgical History:  Procedure Laterality Date  . ABDOMINAL HYSTERECTOMY    . BLADDER SURGERY    . BREAST BIOPSY Left   . CERVICAL CONE BIOPSY    . CHOLECYSTECTOMY N/A 10/26/2014   Procedure: LAPAROSCOPIC CHOLECYSTECTOMY;  Surgeon: Coralie Keens, MD;  Location: [facility];  Service: General;  Laterality: N/A;  . COLONOSCOPY W/ BIOPSIES  multiple  . DIAGNOSTIC LAPAROSCOPY    . DILATION AND CURETTAGE OF UTERUS  07,11  . ESOPHAGOGASTRODUODENOSCOPY  multiple  . KNEE ARTHROSCOPY Left 11/2008  . LAPAROSCOPIC VAGINAL HYSTERECTOMY WITH SALPINGO OOPHORECTOMY Bilateral 10/03/2015   Procedure: LAPAROSCOPIC ASSISTED VAGINAL HYSTERECTOMY WITH SALPINGECTOMY;  Surgeon: Eldred Manges, MD;  Location: [facility];  Service: Gynecology;  Laterality: Bilateral;  . LEFT FINGER SURGERY    . MASS EXCISION Left 12/24/2012   Procedure: LEFT EXCISION OF PALMAR MASS X TWO;  Surgeon: Roseanne Kaufman, MD;  Location: [facility];  Service: Orthopedics;  Laterality: Left;    There were no vitals filed for this visit.  Subjective Assessment - 10/23/17 1531    Subjective  I liked the  dry needling.  Denies excessive soreness .  Posterior neck pain and pain between shoulder blades.      Currently in Pain?  Yes    Pain Score  2     Pain Location  Neck    Pain Type  Chronic pain    Aggravating Factors   between shoulder blade                        [facility] Adult PT Treatment/Exercise - 10/23/17 0001      Neck Exercises: Supine   Capital Flexion  15 reps    Other Supine Exercise  red band scapular strengthening series 10-15 reps each      Moist Heat Therapy   Number Minutes Moist Heat  15 Minutes    Moist Heat Location  Cervical      Electrical Stimulation   Electrical Stimulation Location  thoracic     Electrical Stimulation Action  IFC    Electrical Stimulation Parameters  10 ma     Electrical Stimulation Goals  Pain       Manual Therapy   Soft tissue mobilization  bil rhomboids       Trigger Point Dry Needling - 10/23/17 1619    Consent Given?  Yes    Muscles Treated Upper Body  Rhomboids    Rhomboids Response  Twitch response elicited;Palpable increased muscle length           PT Education - 10/23/17 1546    Education Details  supine scapular ex's red band     Person(s) Educated  Patient    Methods  Explanation;Demonstration;Handout    Comprehension  Verbalized understanding;Returned demonstration       PT Short Term Goals - 10/16/17 2139      PT SHORT TERM GOAL #1   Title  The patient will demonstrate awareness of postural correction to decrease head forward posture at work and home    Time  4    Period  Weeks    Status  New      PT SHORT TERM GOAL #2   Title  The patient will report a 30% improvement in headaches    Time  4    Period  Weeks    Status  New      PT SHORT TERM GOAL #3   Title  The patient will have improved right sidebending ROM to 40 degrees and left rotation to 40 degrees needed for driving and work duties    Time  4    Period  Weeks    Status  New        PT Long Term Goals - 10/16/17 2142      PT LONG TERM GOAL #1   Title  The patient will be independent in safe self progression of HEP and self care strategies    Time  8    Period  Weeks    Status  New    Target Date  12/11/17      PT LONG TERM GOAL #2   Title  The patient will report a 60% improvement in headaches     Time  8    Period  Weeks    Status  New      PT LONG TERM GOAL #3   Title  The patient will have cervical and thoracic muscle strength grossly 4+/5 to 5-/5 needed improved postural alignment     Time  8    Period  Weeks    Status  New      PT LONG TERM GOAL #4   Title  FOTO functional outcome score improved from 30% limitation to 28% limitation     Time  8    Period  Weeks    Status  New            Plan - 10/23/17 1619    Clinical Impression Statement  The patient's  primary complaint today is bil interscapular pain.  She has several tender points right > left.  Improved soft tissue mobility following DN and manual therapy.  Able to perform scapular strengthening ex's without an increase in pain.  Difficulty activation deep cervical flexors.  She will need additional instruction and practice to avoid compensating with anterior cervical muscles.  Good relief with ES/heat.      Rehab Potential  Good    Clinical Impairments Affecting Rehab Potential  see co-morbidities above    PT Frequency  2x / week    PT Duration  8 weeks    PT Treatment/Interventions  ADLs/Self Care Home Management;Cryotherapy;Electrical Stimulation;Ultrasound;Traction;Moist Heat;Neuromuscular re-education;Therapeutic activities;Therapeutic exercise;Patient/family education;Manual techniques;Dry needling;Taping    PT Next Visit Plan   DN suboccipitals, cervical multifidi, upper traps;  manual techniques;  progress HEP including isometrics or deep cervical flexor strengthening for "head heaviness";    manual traction;  modalities as needed       Patient will benefit from skilled therapeutic intervention in order to improve the following deficits and impairments:  Pain, Postural dysfunction, Increased muscle spasms, Increased fascial restricitons, Decreased range of motion, Decreased strength  Visit Diagnosis: Acute nonintractable headache, unspecified headache type  Cervicalgia  Abnormal posture     Problem List Patient Active Problem List   Diagnosis Date Noted  . Attention deficit hyperactivity disorder (ADHD), combined type 10/19/2017  . Vitreous floaters of both eyes 10/02/2017  . Recurrent circadian rhythm sleep disorder, shift work type 03/31/2017  . Morbid obesity ([place]) 03/31/2017  . Cervical intraepithelial neoplasia grade 1 03/17/2017  . Female bladder prolapse 03/17/2017  . Irritable bowel syndrome 03/17/2017  . Class 2 obesity with serious comorbidity and body mass  index (BMI) of 35.0 to 35.9 in adult 12/30/2016  . Circadian rhythm sleep disorder, shift work type 09/26/2016  . Fatty infiltration of liver 09/26/2016  . OSA on CPAP 09/26/2016  . PAF (paroxysmal atrial fibrillation) ([place]) 02/01/2016  . SVT (supraventricular tachycardia) ([place]) 01/31/2016  . Essential hypertension 01/31/2016  . Postmenopausal bleeding 10/03/2015  . LGSIL (low grade squamous intraepithelial dysplasia) 08/05/2015  . PMB (postmenopausal bleeding) 08/05/2015  . Dupuytren contracture 05/31/2015  . Elevated LFTs 05/31/2015  . Kyphosis, acquired 05/31/2015  . Premenstrual tension syndrome 05/31/2015  . Tenosynovitis of right foot 05/31/2015  . Allergic rhinitis 05/10/2015  . Cardiac dysrhythmia 05/10/2015  . Chronic depressive disorder 05/10/2015  . Edema 05/10/2015  . Gastroesophageal reflux disease without esophagitis 05/10/2015  . Hyperlipidemia associated with type 2 diabetes mellitus ([place]) 05/10/2015  . Microalbuminuria 05/10/2015  . Migraine headache without aura 05/10/2015  . Mixed hyperlipidemia 05/10/2015  . Vitamin D deficiency 05/10/2015  . PAC (premature atrial contraction) 09/19/2011   Ruben Im, PT 10/23/17 4:26 PM Phone: [phone] Fax: [phone]  Alvera Singh 10/23/2017, 4:24 PM  [facility] [address] [place], [place], [address] Phone: [phone]   Fax:  [phone]  Name: Alexandra Walker MRN: [id number] Date of Birth: 1955-11-11

## 2017-10-23 NOTE — Patient Instructions (Signed)
   Over Head Pull: Narrow Grip       On back, knees bent, feet flat, band across thighs, elbows straight but relaxed. Pull hands apart (start). Keeping elbows straight, bring arms up and over head, hands toward floor. Keep pull steady on band. Hold momentarily. Return slowly, keeping pull steady, back to start. Repeat __10_ times. Band color ___red___   Side Pull: Double Arm and Single arms   On back, knees bent, feet flat. Arms perpendicular to body, shoulder level, elbows straight but relaxed. Pull arms out to sides, elbows straight. Resistance band comes across collarbones, hands toward floor. Hold momentarily. Slowly return to starting position. Repeat __10_ times. Band color __red___   Elmer Picker   On back, knees bent, feet flat, left hand on left hip, right hand above left. Pull right arm DIAGONALLY (hip to shoulder) across chest. Bring right arm along head toward floor. Hold momentarily. Slowly return to starting position. Repeat _10__ times. Do with left arm. Band color ___red___   Shoulder Rotation: Double Arm   On back, knees bent, feet flat, elbows tucked at sides, bent 90, hands palms up. Pull hands apart and down toward floor, keeping elbows near sides. Hold momentarily. Slowly return to starting position. Repeat __10_ times. Band color __red____      [facility] [address] [place], [place] [address] Phone # [phone] Fax [phone]

## 2017-10-24 NOTE — Telephone Encounter (Signed)
Referral was originally sent to one office, then sent to internal [facility] office. I cannot speak for any delay on the receiving office end, but patient is scheduled 10/30/17 at 0915.

## 2017-10-27 DIAGNOSIS — H5203 Hypermetropia, bilateral: Secondary | ICD-10-CM | POA: Diagnosis not present

## 2017-10-27 DIAGNOSIS — H16223 Keratoconjunctivitis sicca, not specified as Sjogren's, bilateral: Secondary | ICD-10-CM | POA: Diagnosis not present

## 2017-10-27 DIAGNOSIS — E119 Type 2 diabetes mellitus without complications: Secondary | ICD-10-CM | POA: Diagnosis not present

## 2017-10-27 DIAGNOSIS — H2513 Age-related nuclear cataract, bilateral: Secondary | ICD-10-CM | POA: Insufficient documentation

## 2017-10-27 DIAGNOSIS — H43393 Other vitreous opacities, bilateral: Secondary | ICD-10-CM | POA: Diagnosis not present

## 2017-10-27 DIAGNOSIS — H524 Presbyopia: Secondary | ICD-10-CM | POA: Insufficient documentation

## 2017-10-27 DIAGNOSIS — H52203 Unspecified astigmatism, bilateral: Secondary | ICD-10-CM | POA: Diagnosis not present

## 2017-10-27 DIAGNOSIS — Z83511 Family history of glaucoma: Secondary | ICD-10-CM | POA: Diagnosis not present

## 2017-10-27 DIAGNOSIS — Z7984 Long term (current) use of oral hypoglycemic drugs: Secondary | ICD-10-CM | POA: Diagnosis not present

## 2017-10-28 ENCOUNTER — Encounter: Payer: Self-pay | Admitting: Physical Therapy

## 2017-10-28 ENCOUNTER — Ambulatory Visit: Payer: 59 | Admitting: Physical Therapy

## 2017-10-28 DIAGNOSIS — M542 Cervicalgia: Secondary | ICD-10-CM

## 2017-10-28 DIAGNOSIS — R51 Headache: Secondary | ICD-10-CM | POA: Diagnosis not present

## 2017-10-28 DIAGNOSIS — R293 Abnormal posture: Secondary | ICD-10-CM | POA: Diagnosis not present

## 2017-10-28 DIAGNOSIS — R519 Headache, unspecified: Secondary | ICD-10-CM

## 2017-10-28 NOTE — Patient Instructions (Signed)
    Isometric Rotation   Put left index finger on left temple. Gently try to turn head to right, pushing against finger. Hold _5___ seconds. Repeat on the left. Push and release slowly. Repeat _5___ times. Do __2__ sessions per day.  http://gt2.exer.us/24   Copyright  VHI. All rights reserved.  Isometric Lateral Flexion   Put right index finger on right temple. Gently try to move right ear toward shoulder, pushing against finger. Hold __5__ seconds. Repeat on other side. Push and release slowly. Repeat __5__ times. Do ___2_ sessions per day.  http://gt2.exer.us/22   Copyright  VHI. All rights reserved.  Isometric Flexion   Put the tips of both index fingers lightly on forehead. Gently press into fingers as if looking toward ground. Resist for _5 seconds 5x. http://gt2.exer.us/18   Copyright  VHI. All rights reserved.  Isometric Extension   Put index fingers gently on back of head. Slowly try to look toward ceiling. Push head into fingers for ___5_ seconds. Push and release slowly. Repeat _5___ times. Do _2___ sessions per day.  http://gt2.exer.us/20   Copyright  VHI. All rights reserved.    Ruben Im PT [facility] [address], [place], [place] [address] Phone # [phone] Fax [phone]

## 2017-10-28 NOTE — Therapy (Signed)
[facility] [address] [place], [place], [address] Phone: [phone]   Fax:  [phone]  Physical Therapy Treatment  Patient Details  Name: Alexandra Walker MRN: [id number] Date of Birth: 1955-06-11 Referring Provider: Dr. Briscoe Deutscher   Encounter Date: 10/28/2017  PT End of Session - 10/28/17 1853    Visit Number  4    Date for PT Re-Evaluation  12/11/17    Authorization Type  [facility]    PT Start Time  0930    PT Stop Time  1021    PT Time Calculation (min)  51 min    Activity Tolerance  Patient tolerated treatment well       Past Medical History:  Diagnosis Date  . 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[place])   . Arthritis   . Back pain   . Chronic depressive disorder   . Chronic edema    bilateral ankles  . Circadian rhythm sleep disorder   . Diabetes ([place])   . Fatty liver   . Ganglion of knee    left  . GERD (gastroesophageal reflux disease)    rare  . Glucose intolerance (impaired glucose tolerance)   . Hepatic steatosis   . HLA B27 (HLA B27 positive)   . Hx of pancreatitis   . Hyperlipidemia   . Hypertension   . IBS (irritable bowel syndrome)   . IBS (irritable bowel syndrome)   . Ischemic colitis ([place])   . Joint pain   . Kyphosis   . Lateral epicondylitis of right elbow   . Leg edema   . Lymphadenopathy   . Migraine headache    history of  . Osteoarthritis   . PAC (premature atrial contraction)    PAC, PJC, managed well with BB  . Paronychia   . Paroxysmal atrial fibrillation ([place])    a. 01/2016 - brief episode during adm for severe sepsis.  Marland Kitchen PMS (premenstrual syndrome)   . PONV (postoperative nausea and vomiting)   . Pyelonephritis 01/2016  . Sacroiliitis ([place])   . Seasonal allergies   . Sepsis ([place]) 01/2016  . Sleep hypopnea   . SVT (supraventricular tachycardia) ([place])    a. 01/2016 - SVT, wide complex tachycardia, and brief afib in setting of severe sepsis.  . Trigger ring finger of left  hand   . Vitamin D deficiency     Past Surgical History:  Procedure Laterality Date  . ABDOMINAL HYSTERECTOMY    . BLADDER SURGERY    . BREAST BIOPSY Left   . CERVICAL CONE BIOPSY    . CHOLECYSTECTOMY N/A 10/26/2014   Procedure: LAPAROSCOPIC CHOLECYSTECTOMY;  Surgeon: Coralie Keens, MD;  Location: [facility];  Service: General;  Laterality: N/A;  . COLONOSCOPY W/ BIOPSIES  multiple  . DIAGNOSTIC LAPAROSCOPY    . DILATION AND CURETTAGE OF UTERUS  07,11  . ESOPHAGOGASTRODUODENOSCOPY  multiple  . KNEE ARTHROSCOPY Left 11/2008  . LAPAROSCOPIC VAGINAL HYSTERECTOMY WITH SALPINGO OOPHORECTOMY Bilateral 10/03/2015   Procedure: LAPAROSCOPIC ASSISTED VAGINAL HYSTERECTOMY WITH SALPINGECTOMY;  Surgeon: Eldred Manges, MD;  Location: [facility];  Service: Gynecology;  Laterality: Bilateral;  . LEFT FINGER SURGERY    . MASS EXCISION Left 12/24/2012   Procedure: LEFT EXCISION OF PALMAR MASS X TWO;  Surgeon: Roseanne Kaufman, MD;  Location: [facility];  Service: Orthopedics;  Laterality: Left;    There were no vitals filed for this visit.  Subjective Assessment - 10/28/17 0929    Subjective  I'm alright.  The pain comes and goes.  I got my home TENs unit for home.  I have some questions about it.  Rhomboid better.  Now I feel it in the upper neck.  I think it is the way I sleep.      Currently in Pain?  Yes    Pain Score  4     Pain Location  Neck    Pain Orientation  Upper    Pain Type  Chronic pain    Aggravating Factors   sleep                        [facility] Adult PT Treatment/Exercise - 10/28/17 0001      Self-Care   ADL's  instruction on home TENS use/wear time, how to prolong electrode use      Neck Exercises: Seated   Cervical Isometrics  Flexion;Extension;Right lateral flexion;Left lateral flexion;Right rotation;Left rotation;5 secs;5 reps      Moist Heat Therapy   Number Minutes Moist Heat  10 Minutes    Moist Heat Location  Cervical       Manual Therapy   Soft tissue mobilization  cervical paraspinals, suboccipitals    Manual Traction  3x 30 sec    Muscle Energy Technique  --       Trigger Point Dry Needling - 10/28/17 1853    Consent Given?  Yes    Upper Trapezius Response  Twitch reponse elicited;Palpable increased muscle length    SubOccipitals Response  Twitch response elicited;Palpable increased muscle length    Levator Scapulae Response  Twitch response elicited;Palpable increased muscle length       Performed bilaterally    PT Education - 10/28/17 1853    Education Details  cervical isometrics    Person(s) Educated  Patient    Methods  Explanation;Handout;Demonstration    Comprehension  Returned demonstration;Verbalized understanding       PT Short Term Goals - 10/16/17 2139      PT SHORT TERM GOAL #1   Title  The patient will demonstrate awareness of postural correction to decrease head forward posture at work and home    Time  4    Period  Weeks    Status  New      PT SHORT TERM GOAL #2   Title  The patient will report a 30% improvement in headaches    Time  4    Period  Weeks    Status  New      PT SHORT TERM GOAL #3   Title  The patient will have improved right sidebending ROM to 40 degrees and left rotation to 40 degrees needed for driving and work duties    Time  4    Period  Weeks    Status  New        PT Long Term Goals - 10/16/17 2142      PT LONG TERM GOAL #1   Title  The patient will be independent in safe self progression of HEP and self care strategies    Time  8    Period  Weeks    Status  New    Target Date  12/11/17      PT LONG TERM GOAL #2   Title  The patient will report a 60% improvement in headaches     Time  8    Period  Weeks    Status  New      PT LONG  TERM GOAL #3   Title  The patient will have cervical and thoracic muscle strength grossly 4+/5 to 5-/5 needed improved postural alignment     Time  8    Period  Weeks    Status  New      PT LONG  TERM GOAL #4   Title  FOTO functional outcome score improved from 30% limitation to 28% limitation     Time  8    Period  Weeks    Status  New            Plan - 10/28/17 1854    Clinical Impression Statement  The patient reports a positive response to DN and manual therapy with improved soft tissue length and decreased number of tender points noted.  Extensive discussion of home TENS unit use for pain control.  Discussed decrease in treatment frequency per patient request.      Rehab Potential  Good    Clinical Impairments Affecting Rehab Potential  see co-morbidities above    PT Frequency  2x / week    PT Duration  8 weeks    PT Treatment/Interventions  ADLs/Self Care Home Management;Cryotherapy;Electrical Stimulation;Ultrasound;Traction;Moist Heat;Neuromuscular re-education;Therapeutic activities;Therapeutic exercise;Patient/family education;Manual techniques;Dry needling;Taping    PT Next Visit Plan   recheck ROM;  try foam roll melt method and decompression ex;  assess response to DN suboccipitals, cervical multifidi, upper traps 6/11;  manual techniques;   deep cervical flexor strengthening for "head heaviness";    manual traction;  modalities as needed       Patient will benefit from skilled therapeutic intervention in order to improve the following deficits and impairments:  Pain, Postural dysfunction, Increased muscle spasms, Increased fascial restricitons, Decreased range of motion, Decreased strength  Visit Diagnosis: Acute nonintractable headache, unspecified headache type  Cervicalgia  Abnormal posture     Problem List Patient Active Problem List   Diagnosis Date Noted  . Attention deficit hyperactivity disorder (ADHD), combined type 10/19/2017  . Vitreous floaters of both eyes 10/02/2017  . Recurrent circadian rhythm sleep disorder, shift work type 03/31/2017  . Morbid obesity ([place]) 03/31/2017  . Cervical intraepithelial neoplasia grade 1 03/17/2017  . Female  bladder prolapse 03/17/2017  . Irritable bowel syndrome 03/17/2017  . Class 2 obesity with serious comorbidity and body mass index (BMI) of 35.0 to 35.9 in adult 12/30/2016  . Circadian rhythm sleep disorder, shift work type 09/26/2016  . Fatty infiltration of liver 09/26/2016  . OSA on CPAP 09/26/2016  . PAF (paroxysmal atrial fibrillation) ([place]) 02/01/2016  . SVT (supraventricular tachycardia) ([place]) 01/31/2016  . Essential hypertension 01/31/2016  . Postmenopausal bleeding 10/03/2015  . LGSIL (low grade squamous intraepithelial dysplasia) 08/05/2015  . PMB (postmenopausal bleeding) 08/05/2015  . Dupuytren contracture 05/31/2015  . Elevated LFTs 05/31/2015  . Kyphosis, acquired 05/31/2015  . Premenstrual tension syndrome 05/31/2015  . Tenosynovitis of right foot 05/31/2015  . Allergic rhinitis 05/10/2015  . Cardiac dysrhythmia 05/10/2015  . Chronic depressive disorder 05/10/2015  . Edema 05/10/2015  . Gastroesophageal reflux disease without esophagitis 05/10/2015  . Hyperlipidemia associated with type 2 diabetes mellitus ([place]) 05/10/2015  . Microalbuminuria 05/10/2015  . Migraine headache without aura 05/10/2015  . Mixed hyperlipidemia 05/10/2015  . Vitamin D deficiency 05/10/2015  . PAC (premature atrial contraction) 09/19/2011   Ruben Im, PT 10/28/17 7:01 PM Phone: [phone] Fax: [phone]  Alvera Singh 10/28/2017, 7:00 PM  [facility] [address] [place], [place], [address] Phone: [phone]  Fax:  [phone]  Name: RHANDA LEMIRE MRN: [id number] Date of Birth: October 29, 1955

## 2017-10-29 NOTE — Progress Notes (Signed)
[facility] Note  10/30/2017     CHIEF COMPLAINT Patient presents for Retina Evaluation and Diabetic Eye Exam   HISTORY OF PRESENT ILLNESS: Alexandra Walker is a 62 y.o. female who presents to the clinic today for:   HPI    Retina Evaluation    In both eyes.  This started 3 weeks ago.  Associated Symptoms Floaters and Photophobia.  Negative for Blind Spot, Glare, Shoulder/Hip pain, Distortion, Jaw Claudication, Fatigue, Flashes, Pain, Trauma, Fever, Weight Loss, Scalp Tenderness and Redness.  Context:  distance vision, mid-range vision, near vision and watching TV.  Treatments tried include surgery.  Response to treatment was significant improvement.  I, the attending physician,  performed the HPI with the patient and updated documentation appropriately.          Diabetic Eye Exam    Vision is stable.  Associated Symptoms Negative for Blind Spot, Glare, Shoulder/Hip pain, Fatigue, Jaw Claudication, Photophobia, Distortion, Floaters, Redness, Scalp Tenderness, Weight Loss, Fever, Trauma, Pain and Flashes.  Diabetes characteristics include taking oral medications and Type 2.  This started 13 years ago.  Blood sugar level is controlled.  Last Blood Glucose 110.  Last A1C 5.6.  I, the attending physician,  performed the HPI with the patient and updated documentation appropriately.          Comments    Referral of Dr. Juleen China for retina eval. Patient states for last three weeks she has had flashes in her peripheral vision OU,the flashes went away for one day then came back, she has constant floaters ou ,when she is watching T.v or at the movies she see blood vessels in her vision. Pt is DM, BS 110 (10/20/17) A1C 5.6( a month ago) BS are stable per patient. Pt had RK sx 1990's with significant improvement, cornea erosion 1980's. Pt reports she is mainly here to get  Established with Dr. Coralyn Pear.       Last edited by Bernarda Caffey, MD on 10/30/2017 10:42 AM.  (History)    Pt states she had a new on-set of flashes x 3 weeks ago; Pt reports the flashes have been constant x 2 weeks after manifesting; Pt states after 2 weeks the flashes decreased; Pt states she now sees flashes off and on; Pt reports she is highly myopic (-6.00 sph OU); Pt states she had RK in mid 73's; Pt states she sees Dr. Trinna Post at [facility]; Pt states she requested a second opinion;   Referring physician: Briscoe Deutscher, [address] [place], [place] [address]  HISTORICAL INFORMATION:   Selected notes from the MEDICAL RECORD NUMBER Referred by Briscoe Deutscher, DO for concern of floaters and DM exam LEE:  Ocular Hx- PMH-Arthritis, DM (taking Metformin), High Cholesterol, HTN    CURRENT MEDICATIONS: No current outpatient medications on file. (Ophthalmic Drugs)   No current facility-administered medications for this visit.  (Ophthalmic Drugs)   Current Outpatient Medications (Other)  Medication Sig  . aspirin 325 MG tablet Take 325 mg by mouth daily.  . Blood Glucose Monitoring Suppl (FREESTYLE LITE) DEVI Check blood sugar 2-3 times per day.  Dx:E11.21  . buPROPion (WELLBUTRIN SR) 150 MG 12 hr tablet Take 1 tablet (150 mg total) by mouth daily.  . cholecalciferol (VITAMIN D) 1000 UNITS tablet Take 1,000 Units by mouth daily.  . cyclobenzaprine (FLEXERIL) 5 MG tablet Take 1 tablet (5 mg total) by mouth at bedtime as needed for muscle spasms.  . diclofenac sodium (VOLTAREN) 1 %  GEL APPLY 2 GRAMS TOPICALLY 2 TIMES DAILY AS NEEDED. NOTE: 2 GRAMS IS EQUIVALENT TO A NICKEL SIZED AMOUNT.  Marland Kitchen glucose blood (FREESTYLE LITE) test strip Check blood sugar 2-3 times per day.  Dx:E11.21.  . ibuprofen (ADVIL,MOTRIN) 200 MG tablet Take 200 mg by mouth every 6 (six) hours as needed.  . Lancets (FREESTYLE) lancets Check blood sugar 2-3 times per day.  Dx:E11.21  . lisdexamfetamine (VYVANSE) 20 MG capsule Take 1 capsule (20 mg total) by mouth daily before breakfast.  . [START ON 11/17/2017]  lisdexamfetamine (VYVANSE) 20 MG capsule Take 1 capsule (20 mg total) by mouth daily before breakfast.  . [START ON 12/18/2017] lisdexamfetamine (VYVANSE) 20 MG capsule Take 1 capsule (20 mg total) by mouth daily before breakfast.  . loratadine (CLARITIN) 10 MG tablet Take by mouth.  . losartan (COZAAR) 50 MG tablet Take 1 tablet (50 mg total) by mouth daily.  . metFORMIN (GLUCOPHAGE XR) 750 MG 24 hr tablet Take two daily before breakfast.  . metoprolol succinate (TOPROL-XL) 25 MG 24 hr tablet Take 1 tablet (25 mg total) by mouth daily.  . Misc. Devices (SCD SOFT SLEEVES/KNEE LENGTH) MISC To be used at home.  . Omega-3 Fatty Acids (FISH OIL PO) Take 1 tablet by mouth daily.  Marland Kitchen saccharomyces boulardii (FLORASTOR) 250 MG capsule Take 1 capsule (250 mg total) by mouth 2 (two) times daily.  . simvastatin (ZOCOR) 40 MG tablet Take 1 tablet (40 mg total) by mouth every evening.  . sulfamethoxazole-trimethoprim (BACTRIM) 400-80 MG tablet Take 1 tablet by mouth 2 (two) times daily.  . Turmeric 450 MG CAPS Take 900 mg by mouth daily.   No current facility-administered medications for this visit.  (Other)      REVIEW OF SYSTEMS: ROS    Positive for: Endocrine, Eyes   Negative for: Constitutional, Gastrointestinal, Neurological, Skin, Genitourinary, Musculoskeletal, HENT, Cardiovascular, Respiratory, Psychiatric, Allergic/Imm, Heme/Lymph   Last edited by Zenovia Jordan, LPN on 08/29/8784  7:67 AM. (History)       ALLERGIES Allergies  Allergen Reactions  . Ace Inhibitors Other (See Comments)    coughing  . Oxycodone-Acetaminophen Nausea And Vomiting  . Vicodin [Hydrocodone-Acetaminophen] Nausea And Vomiting  . Victoza [Liraglutide] Other (See Comments)    pancreatitis    PAST MEDICAL HISTORY Past Medical History:  Diagnosis Date  . 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 Arthritis   . Back pain   . Chronic depressive disorder   . Chronic edema    bilateral ankles  . Circadian rhythm  sleep disorder   . Diabetes ([place])   . Fatty liver   . Ganglion of knee    left  . GERD (gastroesophageal reflux disease)    rare  . Glucose intolerance (impaired glucose tolerance)   . Hepatic steatosis   . HLA B27 (HLA B27 positive)   . Hx of pancreatitis   . Hyperlipidemia   . Hypertension   . IBS (irritable bowel syndrome)   . IBS (irritable bowel syndrome)   . Ischemic colitis ([place])   . Joint pain   . Kyphosis   . Lateral epicondylitis of right elbow   . Leg edema   . Lymphadenopathy   . Migraine headache    history of  . Osteoarthritis   . PAC (premature atrial contraction)    PAC, PJC, managed well with BB  . Paronychia   . Paroxysmal atrial fibrillation ([place])    a. 01/2016 - brief episode during adm for severe sepsis.  Marland Kitchen PMS (  premenstrual syndrome)   . PONV (postoperative nausea and vomiting)   . Pyelonephritis 01/2016  . Sacroiliitis ([place])   . Seasonal allergies   . Sepsis ([place]) 01/2016  . Sleep hypopnea   . SVT (supraventricular tachycardia) ([place])    a. 01/2016 - SVT, wide complex tachycardia, and brief afib in setting of severe sepsis.  . Trigger ring finger of left hand   . Vitamin D deficiency    Past Surgical History:  Procedure Laterality Date  . ABDOMINAL HYSTERECTOMY    . BLADDER SURGERY    . BREAST BIOPSY Left   . CERVICAL CONE BIOPSY    . CHOLECYSTECTOMY N/A 10/26/2014   Procedure: LAPAROSCOPIC CHOLECYSTECTOMY;  Surgeon: Coralie Keens, MD;  Location: [facility];  Service: General;  Laterality: N/A;  . COLONOSCOPY W/ BIOPSIES  multiple  . DIAGNOSTIC LAPAROSCOPY    . DILATION AND CURETTAGE OF UTERUS  07,11  . ESOPHAGOGASTRODUODENOSCOPY  multiple  . EYE SURGERY    . KNEE ARTHROSCOPY Left 11/2008  . LAPAROSCOPIC VAGINAL HYSTERECTOMY WITH SALPINGO OOPHORECTOMY Bilateral 10/03/2015   Procedure: LAPAROSCOPIC ASSISTED VAGINAL HYSTERECTOMY WITH SALPINGECTOMY;  Surgeon: Eldred Manges, MD;  Location: [facility];  Service: Gynecology;   Laterality: Bilateral;  . LASIK  1990   RK  . LEFT FINGER SURGERY    . MASS EXCISION Left 12/24/2012   Procedure: LEFT EXCISION OF PALMAR MASS X TWO;  Surgeon: Roseanne Kaufman, MD;  Location: [facility];  Service: Orthopedics;  Laterality: Left;    FAMILY HISTORY Family History  Problem Relation Age of Onset  . Heart attack Father   . Heart disease Father   . Coronary artery disease Mother   . Hypertension Mother   . Heart disease Mother   . Heart attack Mother   . Diabetes Mother   . Hyperlipidemia Mother   . Kidney disease Mother   . Depression Mother   . Sleep apnea Mother   . Obesity Mother   . Hypertension Sister   . Hypertension Brother   . Colon cancer Neg Hx     SOCIAL HISTORY Social History   Tobacco Use  . Smoking status: Never Smoker  . Smokeless tobacco: Never Used  Substance Use Topics  . Alcohol use: Yes    Alcohol/week: 0.0 oz    Comment: social  . Drug use: No         OPHTHALMIC EXAM:  Base Eye Exam    Visual Acuity (Snellen - Linear)      Right Left   Dist [place] 20/40 +2 20/60 -1   Dist ph [place] 20/25 -1 20/25 -2       Tonometry (Tonopen, 9:48 AM)      Right Left   Pressure 21 20       Pupils      Dark Light Shape React APD   Right 5 3 Round Brisk None   Left 5 3 Round Brisk None       Visual Fields (Counting fingers)      Left Right    Full Full       Extraocular Movement      Right Left    Full, Ortho Full, Ortho       Neuro/Psych    Oriented x3:  Yes   Mood/Affect:  Normal       Dilation    Both eyes:  1.0% Mydriacyl, 2.5% Phenylephrine @ 9:48 AM        Slit Lamp and Fundus Exam  Slit Lamp Exam      Right Left   Lids/Lashes Dermatochalasis - upper lid, Meibomian gland dysfunction Dermatochalasis - upper lid, Meibomian gland dysfunction   Conjunctiva/Sclera White and quiet White and quiet   Cornea 18 cut RK, sub-epi haze and scarring inferior half 16 cut RK with scattered patches of K haze   Anterior  Chamber Deep and quiet Deep and quiet   Iris Round and dilated Round and dilated   Lens 2+ Nuclear sclerosis, 2+ Cortical cataract, 1+ pigment on anterior capsule 2+ Nuclear sclerosis, 2+ Cortical cataract   Vitreous Vitreous syneresis, Posterior vitreous detachment Vitreous syneresis, Posterior vitreous detachment, Weiss ring       Fundus Exam      Right Left   Disc Pink and Sharp Pink and Sharp   C/D Ratio 0.45 0.45   Macula Blunted foveal reflex, mild Retinal pigment epithelial mottling, No heme or edema Blunted foveal reflex, Retinal pigment epithelial mottling, No heme or edema   Vessels Vascular attenuation, AV crossing changes Vascular attenuation, AV crossing changes   Periphery Attached, inferior cobblestoning, peripheral cystoid degeneration Attached, inferior cobblestoning, pigmented cystiod degeneration          IMAGING AND PROCEDURES  Imaging and Procedures for '@TODAY' @  OCT, Retina - OU - Both Eyes       Right Eye Quality was good. Central Foveal Thickness: 286. Progression has no prior data. Findings include normal foveal contour, no IRF, no SRF.   Left Eye Quality was good. Central Foveal Thickness: 286. Progression has no prior data. Findings include normal foveal contour, no IRF, no SRF.   Notes *Images captured and stored on drive  Diagnosis / Impression:  NFP, No IRF/SRF, No DME OU  Clinical management:  See below  Abbreviations: NFP - Normal foveal profile. CME - cystoid macular edema. PED - pigment epithelial detachment. IRF - intraretinal fluid. SRF - subretinal fluid. EZ - ellipsoid zone. ERM - epiretinal membrane. ORA - outer retinal atrophy. ORT - outer retinal tubulation. SRHM - subretinal hyper-reflective material                  ASSESSMENT/PLAN:    ICD-10-CM   1. Posterior vitreous detachment of both eyes H43.813   2. Diabetes mellitus type 2 without retinopathy ([place]) E11.9   3. Essential hypertension I10   4. Hypertensive  retinopathy of both eyes H35.033   5. Retinal edema H35.81 OCT, Retina - OU - Both Eyes  6. High myopia, bilateral H52.13   7. History of radial keratotomy Z98.890   8. Combined forms of age-related cataract of both eyes H25.813     1. PVD / vitreous syneresis OU  Discussed findings and prognosis  No RT or RD on 360 scleral depressed exam  Reviewed s/s of RT/RD  Strict return precautions for any such RT/RD signs/symptoms  F/U 4-6 weeks for recheck  2. Diabetes mellitus, type 2 without retinopathy - The incidence, risk factors for progression, natural history and treatment options for diabetic retinopathy  were discussed with patient.   - The need for close monitoring of blood glucose, blood pressure, and serum lipids, avoiding cigarette or any type of tobacco, and the need for long term follow up was also discussed with patient. - f/u in 1 year, sooner prn  3,4. Hypertensive retinopathy OU - discussed importance of tight BP control - monitor  5. No retinal edema on exam or OCT  6,7. High myopia OU w/ history RK with multiple enhancements/adjustments OU -  some chronic corneal scarring and haze present OU  8. Combined form age-related cataract OU-  - The symptoms of cataract, surgical options, and treatments and risks were discussed with patient. - discussed diagnosis and progression - pt under the expert management of Dr. Trinna Post at [facility] Ordered this visit:  No orders of the defined types were placed in this encounter.      Return in about 6 weeks (around 12/11/2017) for F/U PVD OU, DFE, OCT.  There are no Patient Instructions on file for this visit.   Explained the diagnoses, plan, and follow up with the patient and they expressed understanding.  Patient expressed understanding of the importance of proper follow up care.   This document serves as a record of services personally performed by Gardiner Sleeper, MD, PhD. It was created on their behalf  by [name] Mallick, OA, an ophthalmic assistant. The creation of this record is the provider's dictation and/or activities during the visit.    Electronically signed by: [name] Mallick, OA  06.12.2019 12:56 AM   This document serves as a record of services personally performed by Gardiner Sleeper, MD, PhD. It was created on their behalf by Catha Brow, [place], a certified ophthalmic assistant. The creation of this record is the provider's dictation and/or activities during the visit.  Electronically signed by: Catha Brow, [place]  06.13.19 12:56 AM   Gardiner Sleeper, M.D., Ph.D. Diseases & Surgery of the Retina and Vitreous [facility]  I have reviewed the above documentation for accuracy and completeness, and I agree with the above. Gardiner Sleeper, M.D., Ph.D. 11/03/17 1:07 AM   Abbreviations: M myopia (nearsighted); A astigmatism; H hyperopia (farsighted); P presbyopia; Mrx spectacle prescription;  CTL contact lenses; OD right eye; OS left eye; OU both eyes  XT exotropia; ET esotropia; PEK punctate epithelial keratitis; PEE punctate epithelial erosions; DES dry eye syndrome; MGD meibomian gland dysfunction; ATs artificial tears; PFAT's preservative free artificial tears; [place] nuclear sclerotic cataract; PSC posterior subcapsular cataract; ERM epi-retinal membrane; PVD posterior vitreous detachment; RD retinal detachment; DM diabetes mellitus; DR diabetic retinopathy; NPDR non-proliferative diabetic retinopathy; PDR proliferative diabetic retinopathy; CSME clinically significant macular edema; DME diabetic macular edema; dbh dot blot hemorrhages; CWS cotton wool spot; POAG primary open angle glaucoma; C/D cup-to-disc ratio; HVF humphrey visual field; GVF goldmann visual field; OCT optical coherence tomography; IOP intraocular pressure; BRVO Branch retinal vein occlusion; CRVO central retinal vein occlusion; CRAO central retinal artery occlusion; BRAO branch retinal artery  occlusion; RT retinal tear; SB scleral buckle; PPV pars plana vitrectomy; VH Vitreous hemorrhage; PRP panretinal laser photocoagulation; IVK intravitreal kenalog; VMT vitreomacular traction; MH Macular hole;  NVD neovascularization of the disc; NVE neovascularization elsewhere; AREDS age related eye disease study; ARMD age related macular degeneration; POAG primary open angle glaucoma; EBMD epithelial/anterior basement membrane dystrophy; ACIOL anterior chamber intraocular lens; IOL intraocular lens; PCIOL posterior chamber intraocular lens; Phaco/IOL phacoemulsification with intraocular lens placement; [place] photorefractive keratectomy; LASIK laser assisted in situ keratomileusis; HTN hypertension; DM diabetes mellitus; COPD chronic obstructive pulmonary disease

## 2017-10-30 ENCOUNTER — Ambulatory Visit (INDEPENDENT_AMBULATORY_CARE_PROVIDER_SITE_OTHER): Payer: 59 | Admitting: Ophthalmology

## 2017-10-30 ENCOUNTER — Encounter (INDEPENDENT_AMBULATORY_CARE_PROVIDER_SITE_OTHER): Payer: Self-pay | Admitting: Ophthalmology

## 2017-10-30 DIAGNOSIS — H5213 Myopia, bilateral: Secondary | ICD-10-CM

## 2017-10-30 DIAGNOSIS — H25813 Combined forms of age-related cataract, bilateral: Secondary | ICD-10-CM

## 2017-10-30 DIAGNOSIS — H35033 Hypertensive retinopathy, bilateral: Secondary | ICD-10-CM

## 2017-10-30 DIAGNOSIS — Z9889 Other specified postprocedural states: Secondary | ICD-10-CM

## 2017-10-30 DIAGNOSIS — I10 Essential (primary) hypertension: Secondary | ICD-10-CM | POA: Diagnosis not present

## 2017-10-30 DIAGNOSIS — E119 Type 2 diabetes mellitus without complications: Secondary | ICD-10-CM

## 2017-10-30 DIAGNOSIS — H3581 Retinal edema: Secondary | ICD-10-CM

## 2017-10-30 DIAGNOSIS — H43813 Vitreous degeneration, bilateral: Secondary | ICD-10-CM

## 2017-10-31 ENCOUNTER — Encounter: Payer: 59 | Admitting: Physical Therapy

## 2017-11-03 ENCOUNTER — Encounter (INDEPENDENT_AMBULATORY_CARE_PROVIDER_SITE_OTHER): Payer: Self-pay | Admitting: Ophthalmology

## 2017-11-04 ENCOUNTER — Encounter: Payer: Self-pay | Admitting: Family Medicine

## 2017-11-04 NOTE — Progress Notes (Signed)
Cardiology Office Note    Date:  11/05/2017   ID:  Alexandra Walker, DOB 1956-01-22, MRN [id number]  PCP:  Briscoe Deutscher, DO  Cardiologist: Lauree Chandler, MD  Chief Complaint  Patient presents with  . Follow-up    History of Present Illness:  Alexandra Walker is a 62 y.o. 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  She had a normal stress Myoview in 2009 for chronic chest pain.  Also has IBS and history of esophagitis with dilatation in 2014.  She did have SVT with wide complex tachycardia while in the hospital with severe sepsis most likely secondary to UTI and pyelonephritis.  Question of A. fib when she converted but there was no indication for anticoagulation.  Last 2D echo 09/2015 normal LVEF 60 to 65%.  Coronary CT 02/12/2016 showed a calcium score of 0, normal coronary origin and right dominance with no evidence of CAD.  Patient last seen in our office 01/2016 by Melina Copa, PA-C and was doing well. Holter monitor 02/22/16 showed sinus bradycardia lowest heart rates of 50 bpm no ventricular tachycardia or atrial arrhythmias or high degree AV block.  Patient has recently had trouble with seeing flashes in her peripheral vision and was referred to a retinal specialist.  Most recent labs reviewed and LDL was 72 on 09/19/2017 C met and CBC were stable, hemoglobin A1c 5.7 TSH 1.76.  Patient comes in today for yearly follow-up.  Her biggest concern is she is on Vyvanse for weight loss which is a CNS stimulant.  So far she has tolerated a 20 mg dose but they plan to titrate up to possibly 70 mg daily.  She was concerned about tachycardia and increasing PACs.  She has lost 35 pounds and PCP tried to decrease her Toprol to 25 mg daily but she had a increase in PACs and went back to the 50 mg.  She is off HCTZ and blood pressure has been stable.  She has no cardiac symptoms other than the PACs on occasion but  well-controlled on Toprol 50 mg.  Not exercising for the past 8 months but plans to start.   Past Medical History:  Diagnosis Date  . 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 Arthritis   . Back pain   . Chronic depressive disorder   . Chronic edema    bilateral ankles  . Circadian rhythm sleep disorder   . Diabetes ([place])   . Fatty liver   . Ganglion of knee    left  . GERD (gastroesophageal reflux disease)    rare  . Glucose intolerance (impaired glucose tolerance)   . Hepatic steatosis   . HLA B27 (HLA B27 positive)   . Hx of pancreatitis   . Hyperlipidemia   . Hypertension   . IBS (irritable bowel syndrome)   . IBS (irritable bowel syndrome)   . Ischemic colitis ([place])   . Joint pain   . Kyphosis   . Lateral epicondylitis of right elbow   . Leg edema   . Lymphadenopathy   . Migraine headache    history of  . Osteoarthritis   . PAC (premature atrial contraction)    PAC, PJC, managed well with BB  . Paronychia   . Paroxysmal atrial fibrillation ([place])    a. 01/2016 - brief episode during adm for severe sepsis.  Marland Kitchen PMS (premenstrual syndrome)   . PONV (postoperative nausea and vomiting)   .  Pyelonephritis 01/2016  . Sacroiliitis ([place])   . Seasonal allergies   . Sepsis ([place]) 01/2016  . Sleep hypopnea   . SVT (supraventricular tachycardia) ([place])    a. 01/2016 - SVT, wide complex tachycardia, and brief afib in setting of severe sepsis.  . Trigger ring finger of left hand   . Vitamin D deficiency     Past Surgical History:  Procedure Laterality Date  . ABDOMINAL HYSTERECTOMY    . BLADDER SURGERY    . BREAST BIOPSY Left   . CERVICAL CONE BIOPSY    . CHOLECYSTECTOMY N/A 10/26/2014   Procedure: LAPAROSCOPIC CHOLECYSTECTOMY;  Surgeon: Coralie Keens, MD;  Location: [facility];  Service: General;  Laterality: N/A;  . COLONOSCOPY W/ BIOPSIES  multiple  . DIAGNOSTIC LAPAROSCOPY    . DILATION AND CURETTAGE OF UTERUS  07,11  . ESOPHAGOGASTRODUODENOSCOPY  multiple   . EYE SURGERY    . KNEE ARTHROSCOPY Left 11/2008  . LAPAROSCOPIC VAGINAL HYSTERECTOMY WITH SALPINGO OOPHORECTOMY Bilateral 10/03/2015   Procedure: LAPAROSCOPIC ASSISTED VAGINAL HYSTERECTOMY WITH SALPINGECTOMY;  Surgeon: Eldred Manges, MD;  Location: [facility];  Service: Gynecology;  Laterality: Bilateral;  . LASIK  1990   RK  . LEFT FINGER SURGERY    . MASS EXCISION Left 12/24/2012   Procedure: LEFT EXCISION OF PALMAR MASS X TWO;  Surgeon: Roseanne Kaufman, MD;  Location: [facility];  Service: Orthopedics;  Laterality: Left;    Current Medications: Current Meds  Medication Sig  . aspirin 325 MG tablet Take 325 mg by mouth daily.  . Blood Glucose Monitoring Suppl (FREESTYLE LITE) DEVI Check blood sugar 2-3 times per day.  Dx:E11.21  . cholecalciferol (VITAMIN D) 1000 UNITS tablet Take 1,000 Units by mouth daily.  . cyclobenzaprine (FLEXERIL) 5 MG tablet Take 1 tablet (5 mg total) by mouth at bedtime as needed for muscle spasms.  . diclofenac sodium (VOLTAREN) 1 % GEL APPLY 2 GRAMS TOPICALLY 2 TIMES DAILY AS NEEDED. NOTE: 2 GRAMS IS EQUIVALENT TO A NICKEL SIZED AMOUNT.  Marland Kitchen glucose blood (FREESTYLE LITE) test strip Check blood sugar 2-3 times per day.  Dx:E11.21.  . ibuprofen (ADVIL,MOTRIN) 200 MG tablet Take 200 mg by mouth every 6 (six) hours as needed.  . Lancets (FREESTYLE) lancets Check blood sugar 2-3 times per day.  Dx:E11.21  . [START ON 12/18/2017] lisdexamfetamine (VYVANSE) 20 MG capsule Take 1 capsule (20 mg total) by mouth daily before breakfast.  . loratadine (CLARITIN) 10 MG tablet Take by mouth.  . losartan (COZAAR) 50 MG tablet Take 1 tablet (50 mg total) by mouth daily.  . metFORMIN (GLUCOPHAGE XR) 750 MG 24 hr tablet Take two daily before breakfast.  . metoprolol succinate (TOPROL-XL) 25 MG 24 hr tablet Take 1 tablet (25 mg total) by mouth daily.  . Misc. Devices (SCD SOFT SLEEVES/KNEE LENGTH) MISC To be used at home.  . Omega-3 Fatty Acids (FISH OIL PO) Take 1  tablet by mouth daily.  Marland Kitchen saccharomyces boulardii (FLORASTOR) 250 MG capsule Take 1 capsule (250 mg total) by mouth 2 (two) times daily.  . simvastatin (ZOCOR) 40 MG tablet Take 1 tablet (40 mg total) by mouth every evening.  . sulfamethoxazole-trimethoprim (BACTRIM) 400-80 MG tablet Take 1 tablet by mouth 2 (two) times daily.  . Turmeric 450 MG CAPS Take 900 mg by mouth daily.     Allergies:   Ace inhibitors; Oxycodone-acetaminophen; Vicodin [hydrocodone-acetaminophen]; and Victoza [liraglutide]   Social History   Socioeconomic History  . Marital status:  Divorced    Spouse name: Not on file  . Number of children: 1  . Years of education: Not on file  . Highest education level: Not on file  Occupational History  . Occupation: [employer]    Employer: [employer]  Social Needs  . Financial resource strain: Not on file  . Food insecurity:    Worry: Not on file    Inability: Not on file  . Transportation needs:    Medical: Not on file    Non-medical: Not on file  Tobacco Use  . Smoking status: Never Smoker  . Smokeless tobacco: Never Used  Substance and Sexual Activity  . Alcohol use: Yes    Alcohol/week: 0.0 oz    Comment: social  . Drug use: No  . Sexual activity: Not on file  Lifestyle  . Physical activity:    Days per week: Not on file    Minutes per session: Not on file  . Stress: Not on file  Relationships  . Social connections:    Talks on phone: Not on file    Gets together: Not on file    Attends religious service: Not on file    Active member of club or organization: Not on file    Attends meetings of clubs or organizations: Not on file    Relationship status: Not on file  Other Topics Concern  . 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     Family History:  The patient's family history includes Coronary artery disease in her mother; Depression in her mother; Diabetes in her mother; Heart attack in  her father and mother; Heart disease in her father and mother; Hyperlipidemia in her mother; Hypertension in her brother, mother, and sister; Kidney disease in her mother; Obesity in her mother; Sleep apnea in her mother.   ROS:   Please see the history of present illness.    Review of Systems  Constitution: Negative.  HENT: Negative.   Eyes: Positive for visual disturbance.  Cardiovascular: Positive for leg swelling and palpitations.  Respiratory: Negative.   Hematologic/Lymphatic: Negative.   Musculoskeletal: Negative.  Negative for joint pain.  Gastrointestinal: Negative.   Genitourinary: Negative.   Neurological: Negative.    All other systems reviewed and are negative.   PHYSICAL EXAM:   VS:  BP 120/68   Pulse (!) 59   Ht '5\' 6"'  (1.676 m)   Wt 228 lb (103.4 kg)   LMP 05/20/2008   SpO2 98%   BMI 36.80 kg/m   Physical Exam  GEN: Well nourished, well developed, in no acute distress  Neck: no JVD, carotid bruits, or masses Cardiac:RRR; no murmurs, rubs, or gallops  Respiratory:  clear to auscultation bilaterally, normal work of breathing GI: soft, nontender, nondistended, + BS Ext: without cyanosis, clubbing, or edema, Good distal pulses bilaterally Neuro:  Alert and Oriented x 3 Psych: euthymic mood, full affect  Wt Readings from Last 3 Encounters:  11/05/17 228 lb (103.4 kg)  10/17/17 230 lb 12.8 oz (104.7 kg)  10/14/17 226 lb 6.4 oz (102.7 kg)      Studies/Labs Reviewed:   EKG:  EKG is  ordered today.  The ekg ordered today demonstrates sinus bradycardia at 59 bpm with first-degree AV block poor R wave progression anteriorly and T wave inversion laterally, unchanged from prior EKG  Recent Labs: 09/19/2017: ALT 10; BUN 17; Creatinine, Ser 0.74; Hemoglobin 12.9; Platelets 196.0; Potassium 4.2; Sodium  139; TSH 1.76   Lipid Panel    Component Value Date/Time   CHOL 137 09/19/2017 1041   CHOL 144 03/13/2017 0813   TRIG 99.0 09/19/2017 1041   HDL 44.90 09/19/2017  1041   HDL 41 03/13/2017 0813   CHOLHDL 3 09/19/2017 1041   VLDL 19.8 09/19/2017 1041   LDLCALC 72 09/19/2017 1041   LDLCALC 78 03/13/2017 0813    Additional studies/ records that were reviewed today include:  Holter monitor 10/2017Study Highlights   Sinus rhythm with sinus bradycardia, lowest heart rate 50 beats per minute.  No ventricular tachycardia, atrial arrhythmias.  No high grade atrioventricular block.    Echo May 2017: Left ventricle: The cavity size was normal. Systolic function was   normal. The estimated ejection fraction was in the range of 60%   to 65%. Wall motion was normal; there were no regional wall   motion abnormalities. Left ventricular diastolic function   parameters were normal. - Left atrium: The atrium was mildly dilated     ASSESSMENT:    1. PAC (premature atrial contraction)   2. Essential hypertension   3. Hyperlipidemia associated with type 2 diabetes mellitus ([place])   4. Morbid obesity ([place])   5. Family history of early CAD      PLAN:  In order of problems listed above:  PACs with brief SVT/Afib in the setting of severe sepsis.  No recurrence, Holter monitor 02/2016 stable.  Will need to monitor closely on Vyvanse as it is a CNS stimulant.  She will let us know if she has any increase in palpitations or arrhythmias.  Otherwise follow-up with Dr. Angelena Form in 1 year.  Essential hypertension blood pressure well controlled and actually she was able to stop HCTZ with 35 pound weight loss.  If she has low blood pressures would recommend dropping losartan down to 25 mg.  Hyperlipidemia recent LDL excellent 09/2017  Obesity with 35 pound weight loss and on Vyvanse.  Recommend 20 to 30 minutes of exercise daily.    Medication Adjustments/Labs and Tests Ordered: Current medicines are reviewed at length with the patient today.  Concerns regarding medicines are outlined above.  Medication changes, Labs and Tests ordered today are listed in the  Patient Instructions below. Patient Instructions  Medication Instructions:  Your physician recommends that you continue on your current medications as directed. Please refer to the Current Medication list given to you today.   Labwork: None ordered  Testing/Procedures: None ordered  Follow-Up: Your physician wants you to follow-up in: [place] will receive a reminder letter in the mail two months in advance. If you don't receive a letter, please call our office to schedule the follow-up appointment.   Any Other Special Instructions Will Be Listed Below (If Applicable).     If you need a refill on your cardiac medications before your next appointment, please call your pharmacy.      Sumner Boast, PA-C  11/05/2017 9:44 AM    [facility] [place], [place], [place]  [address] Phone: [phone]; Fax: [phone]

## 2017-11-05 ENCOUNTER — Ambulatory Visit (INDEPENDENT_AMBULATORY_CARE_PROVIDER_SITE_OTHER): Payer: 59 | Admitting: Physician Assistant

## 2017-11-05 ENCOUNTER — Encounter: Payer: Self-pay | Admitting: Family Medicine

## 2017-11-05 ENCOUNTER — Encounter: Payer: Self-pay | Admitting: Physician Assistant

## 2017-11-05 ENCOUNTER — Ambulatory Visit: Payer: 59 | Admitting: Physical Therapy

## 2017-11-05 VITALS — BP 120/68 | HR 59 | Ht 66.0 in | Wt 228.0 lb

## 2017-11-05 DIAGNOSIS — R51 Headache: Principal | ICD-10-CM

## 2017-11-05 DIAGNOSIS — I10 Essential (primary) hypertension: Secondary | ICD-10-CM | POA: Diagnosis not present

## 2017-11-05 DIAGNOSIS — M542 Cervicalgia: Secondary | ICD-10-CM

## 2017-11-05 DIAGNOSIS — I491 Atrial premature depolarization: Secondary | ICD-10-CM

## 2017-11-05 DIAGNOSIS — E1169 Type 2 diabetes mellitus with other specified complication: Secondary | ICD-10-CM | POA: Diagnosis not present

## 2017-11-05 DIAGNOSIS — E785 Hyperlipidemia, unspecified: Secondary | ICD-10-CM

## 2017-11-05 DIAGNOSIS — R293 Abnormal posture: Secondary | ICD-10-CM

## 2017-11-05 DIAGNOSIS — Z8249 Family history of ischemic heart disease and other diseases of the circulatory system: Secondary | ICD-10-CM

## 2017-11-05 DIAGNOSIS — R519 Headache, unspecified: Secondary | ICD-10-CM

## 2017-11-05 NOTE — Patient Instructions (Addendum)
Medication Instructions:  Your physician recommends that you continue on your current medications as directed. Please refer to the Current Medication list given to you today.   Labwork: None ordered  Testing/Procedures: None ordered  Follow-Up: Your physician wants you to follow-up in: 1 YEAR WITH DR. MCALHANY   You will receive a reminder letter in the mail two months in advance. If you don't receive a letter, please call our office to schedule the follow-up appointment.   Any Other Special Instructions Will Be Listed Below (If Applicable).     If you need a refill on your cardiac medications before your next appointment, please call your pharmacy.   

## 2017-11-05 NOTE — Therapy (Signed)
[facility] [address] [place], [place], [address] Phone: [phone]   Fax:  [phone]  Physical Therapy Treatment  Patient Details  Name: Alexandra Walker MRN: [id number] Date of Birth: 24-Oct-1955 Referring Provider: Dr. Briscoe Deutscher   Encounter Date: 11/05/2017  PT End of Session - 11/05/17 1458    Visit Number  5    Date for PT Re-Evaluation  12/11/17    Authorization Type  [facility]    PT Start Time  1402    PT Stop Time  7619    PT Time Calculation (min)  43 min    Activity Tolerance  Patient tolerated treatment well;No increased pain    Behavior During Therapy  WFL for tasks assessed/performed       Past Medical History:  Diagnosis Date  . 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 Arthritis   . Back pain   . Chronic depressive disorder   . Chronic edema    bilateral ankles  . Circadian rhythm sleep disorder   . Diabetes ([place])   . Fatty liver   . Ganglion of knee    left  . GERD (gastroesophageal reflux disease)    rare  . Glucose intolerance (impaired glucose tolerance)   . Hepatic steatosis   . HLA B27 (HLA B27 positive)   . Hx of pancreatitis   . Hyperlipidemia   . Hypertension   . IBS (irritable bowel syndrome)   . IBS (irritable bowel syndrome)   . Ischemic colitis ([place])   . Joint pain   . Kyphosis   . Lateral epicondylitis of right elbow   . Leg edema   . Lymphadenopathy   . Migraine headache    history of  . Osteoarthritis   . PAC (premature atrial contraction)    PAC, PJC, managed well with BB  . Paronychia   . Paroxysmal atrial fibrillation ([place])    a. 01/2016 - brief episode during adm for severe sepsis.  Marland Kitchen PMS (premenstrual syndrome)   . PONV (postoperative nausea and vomiting)   . Pyelonephritis 01/2016  . Sacroiliitis ([place])   . Seasonal allergies   . Sepsis ([place]) 01/2016  . Sleep hypopnea   . SVT (supraventricular tachycardia) ([place])    a. 01/2016 - SVT, wide complex  tachycardia, and brief afib in setting of severe sepsis.  . Trigger ring finger of left hand   . Vitamin D deficiency     Past Surgical History:  Procedure Laterality Date  . ABDOMINAL HYSTERECTOMY    . BLADDER SURGERY    . BREAST BIOPSY Left   . CERVICAL CONE BIOPSY    . CHOLECYSTECTOMY N/A 10/26/2014   Procedure: LAPAROSCOPIC CHOLECYSTECTOMY;  Surgeon: Coralie Keens, MD;  Location: [facility];  Service: General;  Laterality: N/A;  . COLONOSCOPY W/ BIOPSIES  multiple  . DIAGNOSTIC LAPAROSCOPY    . DILATION AND CURETTAGE OF UTERUS  07,11  . ESOPHAGOGASTRODUODENOSCOPY  multiple  . EYE SURGERY    . KNEE ARTHROSCOPY Left 11/2008  . LAPAROSCOPIC VAGINAL HYSTERECTOMY WITH SALPINGO OOPHORECTOMY Bilateral 10/03/2015   Procedure: LAPAROSCOPIC ASSISTED VAGINAL HYSTERECTOMY WITH SALPINGECTOMY;  Surgeon: Eldred Manges, MD;  Location: [facility];  Service: Gynecology;  Laterality: Bilateral;  . LASIK  1990   RK  . LEFT FINGER SURGERY    . MASS EXCISION Left 12/24/2012   Procedure: LEFT EXCISION OF PALMAR MASS X TWO;  Surgeon: Roseanne Kaufman, MD;  Location: [facility];  Service: Orthopedics;  Laterality: Left;    There were no vitals filed for this visit.  Subjective Assessment - 11/05/17 1451    Subjective  Pt notes that things are going well.     Currently in Pain?  Yes    Pain Score  4     Pain Location  Neck    Pain Orientation  Upper    Pain Descriptors / Indicators  Aching    Pain Type  Chronic pain    Pain Onset  1 to 4 weeks ago    Pain Frequency  Intermittent    Aggravating Factors   sleep, alot of work at home     Pain Relieving Factors  ibuprofen                       [facility] Adult PT Treatment/Exercise - 11/05/17 0001      Self-Care   Self-Care  Posture;Other Self-Care Comments    Posture  desk set up at work    Other Self-Care Comments   self massage and sub-occipital release at home       Exercises   Exercises  Neck       Neck Exercises: Seated   Other Seated Exercise  low rows with green TB x15 reps, high rows with green TB x15 reps       Neck Exercises: Supine   Other Supine Exercise  melt method over soft foam roll       Neck Exercises: Sidelying   Other Sidelying Exercise  thoracic rotation Lt and Rt x15 reps each direction      Neck Exercises: Stretches   Upper Trapezius Stretch  1 rep;Left;Right;30 seconds    Chest Stretch  1 rep;30 seconds;Limitations    Chest Stretch Limitations  in doorway              PT Education - 11/05/17 1456    Education Details  exercises for foam roll    Person(s) Educated  Patient    Methods  Explanation;Demonstration;Verbal cues;Handout    Comprehension  Verbalized understanding       PT Short Term Goals - 11/05/17 1506      PT SHORT TERM GOAL #1   Title  The patient will demonstrate awareness of postural correction to decrease head forward posture at work and home    Time  4    Period  Weeks    Status  On-going      PT SHORT TERM GOAL #2   Title  The patient will report a 30% improvement in headaches    Time  4    Period  Weeks    Status  On-going      PT SHORT TERM GOAL #3   Title  The patient will have improved right sidebending ROM to 40 degrees and left rotation to 40 degrees needed for driving and work duties    Time  4    Period  Weeks    Status  On-going        PT Long Term Goals - 10/16/17 2142      PT LONG TERM GOAL #1   Title  The patient will be independent in safe self progression of HEP and self care strategies    Time  8    Period  Weeks    Status  New    Target Date  12/11/17      PT LONG TERM GOAL #2   Title  The patient will report  a 60% improvement in headaches     Time  8    Period  Weeks    Status  New      PT LONG TERM GOAL #3   Title  The patient will have cervical and thoracic muscle strength grossly 4+/5 to 5-/5 needed improved postural alignment     Time  8    Period  Weeks    Status  New      PT  LONG TERM GOAL #4   Title  FOTO functional outcome score improved from 30% limitation to 28% limitation     Time  8    Period  Weeks    Status  New            Plan - 11/05/17 1502    Clinical Impression Statement  Pt continues to have tension and discomfort throughout the cervical spine and upper trap region. Pt was able to purchase a foam roller at home and therapist reviewed several exercises for this. Therapist also answered pt questions regarding sub-occipital massage and exercise technique on her HEP. Provided education regarding proper desk mechanics at work to decrease strain on the neck and pt verbalized understanding. Ended session without increase in pain.     Rehab Potential  Good    Clinical Impairments Affecting Rehab Potential  see co-morbidities above    PT Frequency  2x / week    PT Duration  8 weeks    PT Treatment/Interventions  ADLs/Self Care Home Management;Cryotherapy;Electrical Stimulation;Ultrasound;Traction;Moist Heat;Neuromuscular re-education;Therapeutic activities;Therapeutic exercise;Patient/family education;Manual techniques;Dry needling;Taping    PT Next Visit Plan  recheck ROM; possible DN to cervical multifidi, upper traps;  manual techniques; deep cervical flexor strengthening for "head heaviness";    manual traction;  modalities as needed    PT Home Exercise Plan  [id number]    Consulted and Agree with Plan of Care  Patient       Patient will benefit from skilled therapeutic intervention in order to improve the following deficits and impairments:  Pain, Postural dysfunction, Increased muscle spasms, Increased fascial restricitons, Decreased range of motion, Decreased strength  Visit Diagnosis: Acute nonintractable headache, unspecified headache type  Cervicalgia  Abnormal posture     Problem List Patient Active Problem List   Diagnosis Date Noted  . Family history of early CAD 11/05/2017  . Attention deficit hyperactivity disorder (ADHD),  combined type 10/19/2017  . Vitreous floaters of both eyes 10/02/2017  . Recurrent circadian rhythm sleep disorder, shift work type 03/31/2017  . Morbid obesity ([place]) 03/31/2017  . Cervical intraepithelial neoplasia grade 1 03/17/2017  . Female bladder prolapse 03/17/2017  . Irritable bowel syndrome 03/17/2017  . Class 2 obesity with serious comorbidity and body mass index (BMI) of 35.0 to 35.9 in adult 12/30/2016  . Circadian rhythm sleep disorder, shift work type 09/26/2016  . Fatty infiltration of liver 09/26/2016  . OSA on CPAP 09/26/2016  . PAF (paroxysmal atrial fibrillation) ([place]) 02/01/2016  . SVT (supraventricular tachycardia) ([place]) 01/31/2016  . Essential hypertension 01/31/2016  . Postmenopausal bleeding 10/03/2015  . LGSIL (low grade squamous intraepithelial dysplasia) 08/05/2015  . PMB (postmenopausal bleeding) 08/05/2015  . Dupuytren contracture 05/31/2015  . Elevated LFTs 05/31/2015  . Kyphosis, acquired 05/31/2015  . Premenstrual tension syndrome 05/31/2015  . Tenosynovitis of right foot 05/31/2015  . Allergic rhinitis 05/10/2015  . Cardiac dysrhythmia 05/10/2015  . Chronic depressive disorder 05/10/2015  . Edema 05/10/2015  . Gastroesophageal reflux disease without esophagitis 05/10/2015  . Hyperlipidemia associated with type 2  diabetes mellitus ([place]) 05/10/2015  . Microalbuminuria 05/10/2015  . Migraine headache without aura 05/10/2015  . Mixed hyperlipidemia 05/10/2015  . Vitamin D deficiency 05/10/2015  . PAC (premature atrial contraction) 09/19/2011    3:07 PM,11/05/17 Alexandra Walker PT, DPT [facility] [address], [place], [place], [address] Phone: [phone]   Fax:  [phone]  Name: Alexandra Walker MRN: [id number] Date of Birth: [birth year]

## 2017-11-05 NOTE — Progress Notes (Signed)
I spoke with patient and told her Dr. Angelena Form agreed that she could titrate the Vyvanse for weight loss as indicated by her PCP.  She thanked me for the call.

## 2017-11-09 ENCOUNTER — Other Ambulatory Visit: Payer: Self-pay | Admitting: Family Medicine

## 2017-11-09 MED ORDER — LISDEXAMFETAMINE DIMESYLATE 30 MG PO CAPS: 30.0000 mg | ORAL_CAPSULE | Freq: Every day | ORAL | 0 refills | Status: DC

## 2017-11-10 ENCOUNTER — Encounter: Payer: Self-pay | Admitting: Family Medicine

## 2017-11-11 ENCOUNTER — Ambulatory Visit: Payer: 59 | Admitting: Physical Therapy

## 2017-11-11 ENCOUNTER — Encounter: Payer: Self-pay | Admitting: Family Medicine

## 2017-11-11 ENCOUNTER — Encounter: Payer: Self-pay | Admitting: Physical Therapy

## 2017-11-11 DIAGNOSIS — R51 Headache: Principal | ICD-10-CM

## 2017-11-11 DIAGNOSIS — R293 Abnormal posture: Secondary | ICD-10-CM | POA: Diagnosis not present

## 2017-11-11 DIAGNOSIS — M542 Cervicalgia: Secondary | ICD-10-CM | POA: Diagnosis not present

## 2017-11-11 DIAGNOSIS — R519 Headache, unspecified: Secondary | ICD-10-CM

## 2017-11-11 NOTE — Therapy (Signed)
[facility] [address], [place], [place], [address] Phone: [phone]   Fax:  [phone]  Physical Therapy Treatment/Discharge Summary  Patient Details  Name: Alexandra Walker MRN: [id number] Date of Birth: 04-26-1956 Referring Provider: Dr. Briscoe Deutscher   Encounter Date: 11/11/2017  PT End of Session - 11/11/17 1554    Visit Number  6    Date for PT Re-Evaluation  12/11/17    Authorization Type  [facility]    PT Start Time  0930    PT Stop Time  1015    PT Time Calculation (min)  45 min    Activity Tolerance  Patient tolerated treatment well       Past Medical History:  Diagnosis Date  . 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 Arthritis   . Back pain   . Chronic depressive disorder   . Chronic edema    bilateral ankles  . Circadian rhythm sleep disorder   . Diabetes ([place])   . Fatty liver   . Ganglion of knee    left  . GERD (gastroesophageal reflux disease)    rare  . Glucose intolerance (impaired glucose tolerance)   . Hepatic steatosis   . HLA B27 (HLA B27 positive)   . Hx of pancreatitis   . Hyperlipidemia   . Hypertension   . IBS (irritable bowel syndrome)   . IBS (irritable bowel syndrome)   . Ischemic colitis ([place])   . Joint pain   . Kyphosis   . Lateral epicondylitis of right elbow   . Leg edema   . Lymphadenopathy   . Migraine headache    history of  . Osteoarthritis   . PAC (premature atrial contraction)    PAC, PJC, managed well with BB  . Paronychia   . Paroxysmal atrial fibrillation ([place])    a. 01/2016 - brief episode during adm for severe sepsis.  Marland Kitchen PMS (premenstrual syndrome)   . PONV (postoperative nausea and vomiting)   . Pyelonephritis 01/2016  . Sacroiliitis ([place])   . Seasonal allergies   . Sepsis ([place]) 01/2016  . Sleep hypopnea   . SVT (supraventricular tachycardia) ([place])    a. 01/2016 - SVT, wide complex tachycardia, and brief afib in setting of severe sepsis.  . Trigger  ring finger of left hand   . Vitamin D deficiency     Past Surgical History:  Procedure Laterality Date  . ABDOMINAL HYSTERECTOMY    . BLADDER SURGERY    . BREAST BIOPSY Left   . CERVICAL CONE BIOPSY    . CHOLECYSTECTOMY N/A 10/26/2014   Procedure: LAPAROSCOPIC CHOLECYSTECTOMY;  Surgeon: Coralie Keens, MD;  Location: [facility];  Service: General;  Laterality: N/A;  . COLONOSCOPY W/ BIOPSIES  multiple  . DIAGNOSTIC LAPAROSCOPY    . DILATION AND CURETTAGE OF UTERUS  07,11  . ESOPHAGOGASTRODUODENOSCOPY  multiple  . EYE SURGERY    . KNEE ARTHROSCOPY Left 11/2008  . LAPAROSCOPIC VAGINAL HYSTERECTOMY WITH SALPINGO OOPHORECTOMY Bilateral 10/03/2015   Procedure: LAPAROSCOPIC ASSISTED VAGINAL HYSTERECTOMY WITH SALPINGECTOMY;  Surgeon: Eldred Manges, MD;  Location: [facility];  Service: Gynecology;  Laterality: Bilateral;  . LASIK  1990   RK  . LEFT FINGER SURGERY    . MASS EXCISION Left 12/24/2012   Procedure: LEFT EXCISION OF PALMAR MASS X TWO;  Surgeon: Roseanne Kaufman, MD;  Location: [facility];  Service: Orthopedics;  Laterality: Left;    There were no vitals filed for this  visit.  Subjective Assessment - 11/11/17 0933    Subjective  Feeling better.  No headaches in a few days.  Just some tightness in left upper trap and upper neck.  Still have some flashes in vision but I don't think it's related to my neck.  I have a foam roll but I think it's too hard.  I would like to learn what else do with the roll.  I think the needling is helpful.      Currently in Pain?  Yes    Pain Score  2     Pain Location  Neck    Pain Orientation  Left    Pain Type  Chronic pain    Aggravating Factors   computer work          [facility] PT Assessment - 11/11/17 0001      Observation/Other Assessments   Focus on Therapeutic Outcomes (FOTO)   37% limitation       AROM   Cervical Flexion  65    Cervical Extension  60    Cervical - Right Side Bend  43    Cervical - Left  Side Bend  43    Cervical - Right Rotation  50    Cervical - Left Rotation  40      Strength   Strength Assessment Site  -- UE grossly 5-/5    Cervical Flexion  4+/5    Cervical Extension  4+/5                   [facility] Adult PT Treatment/Exercise - 11/11/17 0001      Neck Exercises: Theraband   Other Theraband Exercises  review of scapular band series       Neck Exercises: Standing   Other Standing Exercises  lacrosse ball on levator scap muscle with side bend and rotate head away 5x bil       Neck Exercises: Supine   Capital Flexion  10 reps deep cervical flexor nods     Other Supine Exercise  melt method with foam roll nods, rotations and figure 8s    Other Supine Exercise  lying on foam roll vertically with palms up       Neck Exercises: Stabilization   Stabilization  quadruped head lift to neutral with chin tuck 5x               PT Short Term Goals - 11/11/17 0160      PT SHORT TERM GOAL #1   Title  The patient will demonstrate awareness of postural correction to decrease head forward posture at work and home    Status  Achieved      PT SHORT TERM GOAL #2   Title  The patient will report a 30% improvement in headaches    Baseline  70% better    Status  Achieved      PT SHORT TERM GOAL #3   Title  The patient will have improved right sidebending ROM to 40 degrees and left rotation to 40 degrees needed for driving and work duties    Status  Achieved        PT Long Term Goals - 11/11/17 0944      PT LONG TERM GOAL #1   Title  The patient will be independent in safe self progression of HEP and self care strategies    Status  Achieved      PT LONG TERM GOAL #2   Title  The  patient will report a 60% improvement in headaches     Status  Achieved      PT LONG TERM GOAL #3   Title  The patient will have cervical and thoracic muscle strength grossly 4+/5 to 5-/5 needed improved postural alignment     Status  Achieved      PT LONG TERM GOAL #4    Title  FOTO functional outcome score improved from 30% limitation to 28% limitation     Time  8    Period  Weeks    Status  Not Met            Plan - 11/11/17 1554    Clinical Impression Statement  The patient reports a 70% improvement in headaches since the start of therapy.  She is pleased with her response in ROM and pain relief for this chronic issue which she attributes to dry needling and manual therapy.  Encouraged a continuation of a HEP for maintenance of ROM and cervical stabilization for best long term results.  Her cervical right sidebending and left rotation ROM is much improved.  Her cervical and upper quarter strength has improved to 4+/5 to 5-/5 strength.  Her FOTO functional outcome score did not improve for unknown reasons.  Will discharge patient from PT at this time with the majority of goals met and her satisfaction in her current functional level.      Rehab Potential  Good    Clinical Impairments Affecting Rehab Potential  see co-morbidities above    PT Frequency  2x / week    PT Duration  8 weeks    PT Treatment/Interventions  ADLs/Self Care Home Management;Cryotherapy;Electrical Stimulation;Ultrasound;Traction;Moist Heat;Neuromuscular re-education;Therapeutic activities;Therapeutic exercise;Patient/family education;Manual techniques;Dry needling;Taping        PHYSICAL THERAPY DISCHARGE SUMMARY  Visits from Start of Care: 6  Current functional level related to goals / functional outcomes: See clinical impressions above   Remaining deficits: As above   Education / Equipment: Comprehensive HEP Plan: Patient agrees to discharge.  Patient goals were partially met. Patient is being discharged due to being pleased with the current functional level.  ?????         Patient will benefit from skilled therapeutic intervention in order to improve the following deficits and impairments:  Pain, Postural dysfunction, Increased muscle spasms, Increased fascial  restricitons, Decreased range of motion, Decreased strength  Visit Diagnosis: Acute nonintractable headache, unspecified headache type  Cervicalgia  Abnormal posture     Problem List Patient Active Problem List   Diagnosis Date Noted  . Family history of early CAD 11/05/2017  . Attention deficit hyperactivity disorder (ADHD), combined type 10/19/2017  . Vitreous floaters of both eyes 10/02/2017  . Recurrent circadian rhythm sleep disorder, shift work type 03/31/2017  . Morbid obesity ([place]) 03/31/2017  . Cervical intraepithelial neoplasia grade 1 03/17/2017  . Female bladder prolapse 03/17/2017  . Irritable bowel syndrome 03/17/2017  . Class 2 obesity with serious comorbidity and body mass index (BMI) of 35.0 to 35.9 in adult 12/30/2016  . Circadian rhythm sleep disorder, shift work type 09/26/2016  . Fatty infiltration of liver 09/26/2016  . OSA on CPAP 09/26/2016  . PAF (paroxysmal atrial fibrillation) ([place]) 02/01/2016  . SVT (supraventricular tachycardia) ([place]) 01/31/2016  . Essential hypertension 01/31/2016  . Postmenopausal bleeding 10/03/2015  . LGSIL (low grade squamous intraepithelial dysplasia) 08/05/2015  . PMB (postmenopausal bleeding) 08/05/2015  . Dupuytren contracture 05/31/2015  . Elevated LFTs 05/31/2015  . Kyphosis, acquired 05/31/2015  . Premenstrual  tension syndrome 05/31/2015  . Tenosynovitis of right foot 05/31/2015  . Allergic rhinitis 05/10/2015  . Cardiac dysrhythmia 05/10/2015  . Chronic depressive disorder 05/10/2015  . Edema 05/10/2015  . Gastroesophageal reflux disease without esophagitis 05/10/2015  . Hyperlipidemia associated with type 2 diabetes mellitus ([place]) 05/10/2015  . Microalbuminuria 05/10/2015  . Migraine headache without aura 05/10/2015  . Mixed hyperlipidemia 05/10/2015  . Vitamin D deficiency 05/10/2015  . PAC (premature atrial contraction) 09/19/2011   Ruben Im, PT 11/11/17 4:03 PM Phone: [phone] Fax:  [phone]  Alvera Singh 11/11/2017, 4:02 PM  [facility] [address], [place], [place], [address] Phone: [phone]   Fax:  [phone]  Name: Alexandra Walker MRN: [id number] Date of Birth: [birth year]

## 2017-11-11 NOTE — Patient Instructions (Signed)
   Deep cervical flexor nods  "YES" motion   1) Try in lying first then try adding into daily functions   See if you can hold the nod while you wash dishes, brush your teeth etc.         See if you can do the nod while you do you rows in kneeling   Can lie on foam roll to do band exercises.     Melt Method on You Tube    Tennis Ball behind levator scap muscle against the wall, rotate head away and look down toward armpit       [facility] [address] [place], [place] [address] Phone # [phone] Fax [phone]

## 2017-11-13 ENCOUNTER — Telehealth: Payer: Self-pay

## 2017-11-13 NOTE — Telephone Encounter (Signed)
Alexandra Walker Key: [id number] Need help? Call us at [phone]  Status  New(Not sent to plan)  Patient does not have active coverage with the payer.  DrugVyvanse 30MG  capsules  FormMedImpact ePA Form  ? There was an error with your request  insurance telling me that patient not covered will need to call in am.

## 2017-11-14 ENCOUNTER — Encounter: Payer: 59 | Admitting: Physical Therapy

## 2017-11-17 ENCOUNTER — Encounter: Payer: 59 | Admitting: Physical Therapy

## 2017-11-17 MED FILL — SIMVASTATIN 40 MG TABLET: 40 | 90 days supply | Qty: 90 | Fill #1

## 2017-11-17 MED FILL — LOSARTAN POTASSIUM 50 MG TA: 50 | 90 days supply | Qty: 90 | Fill #1

## 2017-11-26 ENCOUNTER — Encounter: Payer: Self-pay | Admitting: Family Medicine

## 2017-11-29 ENCOUNTER — Other Ambulatory Visit: Payer: Self-pay | Admitting: Family Medicine

## 2017-11-29 MED ORDER — AMPHETAMINE-DEXTROAMPHETAMINE 20 MG PO TABS: 20.0000 mg | ORAL_TABLET | Freq: Two times a day (BID) | ORAL | 0 refills | Status: DC

## 2017-11-29 NOTE — Progress Notes (Signed)
Vyvanse needing prior authorization. Will trial Adderall. Briscoe Deutscher, DO

## 2017-12-01 MED FILL — AMPHETAMINE SALTS 20 MG TAB: 20 | 30 days supply | Qty: 60 | Fill #0

## 2017-12-01 NOTE — Progress Notes (Signed)
[facility] - Clinic Note  12/02/2017     CHIEF COMPLAINT Patient presents for Retina Follow Up   HISTORY OF PRESENT ILLNESS: Alexandra Walker is a 61 y.o. female who presents to the clinic today for:   HPI    Retina Follow Up    Patient presents with  Other.  In both eyes.  This started 2 weeks ago.  Severity is mild.  Since onset it is stable.  I, the attending physician,  performed the HPI with the patient and updated documentation appropriately.          Comments    FD/U PVD OU. Patient states she has "less flashes" and they are mostly noticed at night and they are in the lower outer corner. Pt reports she has developed a stye oS. Denies new visual onsets.       Last edited by Zamora, Brian, MD on 12/02/2017  9:57 AM. (History)       Referring physician: Wallace, Erica, DO [address] [place], [place] [address]  HISTORICAL INFORMATION:   Selected notes from the medical record:  Referred by Erica Wallace, DO for concern of floaters and DM exam LEE:  Ocular Hx- PMH-Arthritis, DM (taking Metformin), High Cholesterol, HTN    CURRENT MEDICATIONS: No current outpatient medications on file. (Ophthalmic Drugs)   No current facility-administered medications for this visit.  (Ophthalmic Drugs)   Current Outpatient Medications (Other)  Medication Sig  . amphetamine-dextroamphetamine (ADDERALL) 20 MG tablet Take 1 tablet (20 mg total) by mouth 2 (two) times daily.  . aspirin 325 MG tablet Take 325 mg by mouth daily.  . Blood Glucose Monitoring Suppl (FREESTYLE LITE) DEVI Check blood sugar 2-3 times per day.  Dx:E11.21  . cholecalciferol (VITAMIN D) 1000 UNITS tablet Take 1,000 Units by mouth daily.  . cyclobenzaprine (FLEXERIL) 5 MG tablet Take 1 tablet (5 mg total) by mouth at bedtime as needed for muscle spasms.  . diclofenac sodium (VOLTAREN) 1 % GEL APPLY 2 GRAMS TOPICALLY 2 TIMES DAILY AS NEEDED. NOTE: 2 GRAMS IS EQUIVALENT TO A NICKEL SIZED  AMOUNT.  . glucose blood (FREESTYLE LITE) test strip Check blood sugar 2-3 times per day.  Dx:E11.21.  . ibuprofen (ADVIL,MOTRIN) 200 MG tablet Take 200 mg by mouth every 6 (six) hours as needed.  . Lancets (FREESTYLE) lancets Check blood sugar 2-3 times per day.  Dx:E11.21  . lisdexamfetamine (VYVANSE) 30 MG capsule Take 1 capsule (30 mg total) by mouth daily.  . loratadine (CLARITIN) 10 MG tablet Take by mouth.  . losartan (COZAAR) 50 MG tablet Take 1 tablet (50 mg total) by mouth daily.  . metFORMIN (GLUCOPHAGE XR) 750 MG 24 hr tablet Take two daily before breakfast.  . metoprolol succinate (TOPROL-XL) 25 MG 24 hr tablet Take 1 tablet (25 mg total) by mouth daily.  . Misc. Devices (SCD SOFT SLEEVES/KNEE LENGTH) MISC To be used at home.  . Omega-3 Fatty Acids (FISH OIL PO) Take 1 tablet by mouth daily.  . saccharomyces boulardii (FLORASTOR) 250 MG capsule Take 1 capsule (250 mg total) by mouth 2 (two) times daily.  . simvastatin (ZOCOR) 40 MG tablet Take 1 tablet (40 mg total) by mouth every evening.  . sulfamethoxazole-trimethoprim (BACTRIM) 400-80 MG tablet Take 1 tablet by mouth 2 (two) times daily.  . Turmeric 450 MG CAPS Take 900 mg by mouth daily.   No current facility-administered medications for this visit.  (Other)      REVIEW OF   SYSTEMS: ROS    Positive for: Eyes   Negative for: Constitutional, Gastrointestinal, Neurological, Skin, Genitourinary, Musculoskeletal, HENT, Endocrine, Cardiovascular, Respiratory, Psychiatric, Allergic/Imm, Heme/Lymph   Last edited by Zenovia Jordan, LPN on 0/93/2355  7:32 AM. (History)       ALLERGIES Allergies  Allergen Reactions  . Ace Inhibitors Other (See Comments)    coughing  . Oxycodone-Acetaminophen Nausea And Vomiting  . Vicodin [Hydrocodone-Acetaminophen] Nausea And Vomiting  . Victoza [Liraglutide] Other (See Comments)    pancreatitis    PAST MEDICAL HISTORY Past Medical History:  Diagnosis Date  . 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 Arthritis   . Back pain   . Chronic depressive disorder   . Chronic edema    bilateral ankles  . Circadian rhythm sleep disorder   . Diabetes ([place])   . Fatty liver   . Ganglion of knee    left  . GERD (gastroesophageal reflux disease)    rare  . Glucose intolerance (impaired glucose tolerance)   . Hepatic steatosis   . HLA B27 (HLA B27 positive)   . Hx of pancreatitis   . Hyperlipidemia   . Hypertension   . IBS (irritable bowel syndrome)   . IBS (irritable bowel syndrome)   . Ischemic colitis ([place])   . Joint pain   . Kyphosis   . Lateral epicondylitis of right elbow   . Leg edema   . Lymphadenopathy   . Migraine headache    history of  . Osteoarthritis   . PAC (premature atrial contraction)    PAC, PJC, managed well with BB  . Paronychia   . Paroxysmal atrial fibrillation ([place])    a. 01/2016 - brief episode during adm for severe sepsis.  Marland Kitchen PMS (premenstrual syndrome)   . PONV (postoperative nausea and vomiting)   . Pyelonephritis 01/2016  . Sacroiliitis ([place])   . Seasonal allergies   . Sepsis ([place]) 01/2016  . Sleep hypopnea   . SVT (supraventricular tachycardia) ([place])    a. 01/2016 - SVT, wide complex tachycardia, and brief afib in setting of severe sepsis.  . Trigger ring finger of left hand   . Vitamin D deficiency    Past Surgical History:  Procedure Laterality Date  . ABDOMINAL HYSTERECTOMY    . BLADDER SURGERY    . BREAST BIOPSY Left   . CERVICAL CONE BIOPSY    . CHOLECYSTECTOMY N/A 10/26/2014   Procedure: LAPAROSCOPIC CHOLECYSTECTOMY;  Surgeon: Coralie Keens, MD;  Location: [facility];  Service: General;  Laterality: N/A;  . COLONOSCOPY W/ BIOPSIES  multiple  . DIAGNOSTIC LAPAROSCOPY    . DILATION AND CURETTAGE OF UTERUS  07,11  . ESOPHAGOGASTRODUODENOSCOPY  multiple  . EYE SURGERY    . KNEE ARTHROSCOPY Left 11/2008  . LAPAROSCOPIC VAGINAL HYSTERECTOMY WITH SALPINGO OOPHORECTOMY Bilateral 10/03/2015   Procedure:  LAPAROSCOPIC ASSISTED VAGINAL HYSTERECTOMY WITH SALPINGECTOMY;  Surgeon: Eldred Manges, MD;  Location: [facility];  Service: Gynecology;  Laterality: Bilateral;  . LASIK  1990   RK  . LEFT FINGER SURGERY    . MASS EXCISION Left 12/24/2012   Procedure: LEFT EXCISION OF PALMAR MASS X TWO;  Surgeon: Roseanne Kaufman, MD;  Location: [facility];  Service: Orthopedics;  Laterality: Left;    FAMILY HISTORY Family History  Problem Relation Age of Onset  . Heart attack Father   . Heart disease Father   . Coronary artery disease Mother   . Hypertension Mother   . Heart disease Mother   .  Heart attack Mother   . Diabetes Mother   . Hyperlipidemia Mother   . Kidney disease Mother   . Depression Mother   . Sleep apnea Mother   . Obesity Mother   . Hypertension Sister   . Hypertension Brother   . Colon cancer Neg Hx     SOCIAL HISTORY Social History   Tobacco Use  . Smoking status: Never Smoker  . Smokeless tobacco: Never Used  Substance Use Topics  . Alcohol use: Yes    Alcohol/week: 0.0 oz    Comment: social  . Drug use: No         OPHTHALMIC EXAM:  Base Eye Exam    Visual Acuity (Snellen - Linear)      Right Left   Dist [place] 20/40 20/60   Dist ph [place] 20/30 20/25       Tonometry (Tonopen, 9:32 AM)      Right Left   Pressure 16 16       Pupils      Dark Light Shape React APD   Right 4 3 Round Brisk None   Left 4 3 Round Brisk None       Visual Fields (Counting fingers)      Left Right    Full Full       Extraocular Movement      Right Left    Full, Ortho Full, Ortho       Neuro/Psych    Oriented x3:  Yes   Mood/Affect:  Normal       Dilation    Both eyes:  1.0% Mydriacyl, 2.5% Phenylephrine @ 9:32 AM        Slit Lamp and Fundus Exam    Slit Lamp Exam      Right Left   Lids/Lashes Dermatochalasis - upper lid, Meibomian gland dysfunction Dermatochalasis - upper lid, Meibomian gland dysfunction   Conjunctiva/Sclera White and quiet White  and quiet   Cornea 18 cut RK, sub-epi haze and scarring inferior half 16 cut RK with scattered patches of K haze   Anterior Chamber Deep and quiet Deep and quiet   Iris Round and dilated Round and dilated   Lens 2+ Nuclear sclerosis, 2+ Cortical cataract, 1+ pigment on anterior capsule 2+ Nuclear sclerosis, 2+ Cortical cataract   Vitreous Vitreous syneresis, Posterior vitreous detachment Vitreous syneresis, Posterior vitreous detachment, Weiss ring       Fundus Exam      Right Left   Disc Pink and Sharp Pink and Sharp, mild tilt   C/D Ratio 0.45 0.5   Macula Blunted foveal reflex, mild Retinal pigment epithelial mottling, No heme or edema Blunted foveal reflex, Retinal pigment epithelial mottling, No heme or edema   Vessels Vascular attenuation, AV crossing changes Vascular attenuation, AV crossing changes   Periphery Attached, inferior cobblestoning, peripheral cystoid degeneration Attached, inferior pigmented cobblestoning from 0430 to 0700, scattered peripheral cystiod degeneration          IMAGING AND PROCEDURES  Imaging and Procedures for @TODAY@  OCT, Retina - OU - Both Eyes       Right Eye Quality was good. Central Foveal Thickness: 278. Progression has been stable. Findings include normal foveal contour, no IRF, no SRF.   Left Eye Quality was good. Central Foveal Thickness: 277. Progression has been stable. Findings include normal foveal contour, no IRF, no SRF.   Notes *Images captured and stored on drive  Diagnosis / Impression:  NFP, No IRF/SRF, No DME OU  Clinical   management:  See below  Abbreviations: NFP - Normal foveal profile. CME - cystoid macular edema. PED - pigment epithelial detachment. IRF - intraretinal fluid. SRF - subretinal fluid. EZ - ellipsoid zone. ERM - epiretinal membrane. ORA - outer retinal atrophy. ORT - outer retinal tubulation. SRHM - subretinal hyper-reflective material                  ASSESSMENT/PLAN:    ICD-10-CM   1.  Posterior vitreous detachment of both eyes H43.813 OCT, Retina - OU - Both Eyes  2. Diabetes mellitus type 2 without retinopathy (HCC) E11.9   3. Essential hypertension I10   4. Hypertensive retinopathy of both eyes H35.033   5. Retinal edema H35.81 OCT, Retina - OU - Both Eyes  6. High myopia, bilateral H52.13   7. History of radial keratotomy Z98.890   8. Combined forms of age-related cataract of both eyes H25.813     1. PVD / vitreous syneresis OU  Stable  Discussed findings and prognosis  No RT or RD on 360 scleral depressed exam  Reviewed s/s of RT/RD  Strict return precautions for any such RT/RD signs/symptoms  F/U 3-4 months for recheck  2. Diabetes mellitus, type 2 without retinopathy - The incidence, risk factors for progression, natural history and treatment options for diabetic retinopathy  were discussed with patient.   - The need for close monitoring of blood glucose, blood pressure, and serum lipids, avoiding cigarette or any type of tobacco, and the need for long term follow up was also discussed with patient. - f/u in 1 year, sooner prn  3,4. Hypertensive retinopathy OU - discussed importance of tight BP control - monitor  5. No retinal edema on exam or OCT  6,7. High myopia OU w/ history RK with multiple enhancements/adjustments OU - some chronic corneal scarring and haze present OU  8. Combined form age-related cataract OU-  - The symptoms of cataract, surgical options, and treatments and risks were discussed with patient. - discussed diagnosis and progression - pt under the expert management of Dr. Forsey at [facility]   Ophthalmic Meds Ordered this visit:  No orders of the defined types were placed in this encounter.      Return in about 4 months (around 04/04/2018) for F/U PVD OU, DFE, OCT.  There are no Patient Instructions on file for this visit.   Explained the diagnoses, plan, and follow up with the patient and they expressed understanding.   Patient expressed understanding of the importance of proper follow up care.   This document serves as a record of services personally performed by Brian G. Zamora, MD, PhD. It was created on their behalf by Meredith Fabian, COA, a certified ophthalmic assistant. The creation of this record is the provider's dictation and/or activities during the visit.  Electronically signed by: Meredith Fabian, COA  07.15.19 2:46 PM   Brian G. Zamora, M.D., Ph.D. Diseases & Surgery of the Retina and Vitreous [facility]  I have reviewed the above documentation for accuracy and completeness, and I agree with the above. Brian G. Zamora, M.D., Ph.D. 12/02/17 2:47 PM     Abbreviations: M myopia (nearsighted); A astigmatism; H hyperopia (farsighted); P presbyopia; Mrx spectacle prescription;  CTL contact lenses; OD right eye; OS left eye; OU both eyes  XT exotropia; ET esotropia; PEK punctate epithelial keratitis; PEE punctate epithelial erosions; DES dry eye syndrome; MGD meibomian gland dysfunction; ATs artificial tears; PFAT's preservative free artificial tears; NSC nuclear sclerotic   cataract; PSC posterior subcapsular cataract; ERM epi-retinal membrane; PVD posterior vitreous detachment; RD retinal detachment; DM diabetes mellitus; DR diabetic retinopathy; NPDR non-proliferative diabetic retinopathy; PDR proliferative diabetic retinopathy; CSME clinically significant macular edema; DME diabetic macular edema; dbh dot blot hemorrhages; CWS cotton wool spot; POAG primary open angle glaucoma; C/D cup-to-disc ratio; HVF humphrey visual field; GVF goldmann visual field; OCT optical coherence tomography; IOP intraocular pressure; BRVO Branch retinal vein occlusion; CRVO central retinal vein occlusion; CRAO central retinal artery occlusion; BRAO branch retinal artery occlusion; RT retinal tear; SB scleral buckle; PPV pars plana vitrectomy; VH Vitreous hemorrhage; PRP panretinal laser  photocoagulation; IVK intravitreal kenalog; VMT vitreomacular traction; MH Macular hole;  NVD neovascularization of the disc; NVE neovascularization elsewhere; AREDS age related eye disease study; ARMD age related macular degeneration; POAG primary open angle glaucoma; EBMD epithelial/anterior basement membrane dystrophy; ACIOL anterior chamber intraocular lens; IOL intraocular lens; PCIOL posterior chamber intraocular lens; Phaco/IOL phacoemulsification with intraocular lens placement; PRK photorefractive keratectomy; LASIK laser assisted in situ keratomileusis; HTN hypertension; DM diabetes mellitus; COPD chronic obstructive pulmonary disease  

## 2017-12-02 ENCOUNTER — Ambulatory Visit (INDEPENDENT_AMBULATORY_CARE_PROVIDER_SITE_OTHER): Payer: 59 | Admitting: Ophthalmology

## 2017-12-02 ENCOUNTER — Encounter (INDEPENDENT_AMBULATORY_CARE_PROVIDER_SITE_OTHER): Payer: Self-pay | Admitting: Ophthalmology

## 2017-12-02 DIAGNOSIS — H25813 Combined forms of age-related cataract, bilateral: Secondary | ICD-10-CM

## 2017-12-02 DIAGNOSIS — H3581 Retinal edema: Secondary | ICD-10-CM

## 2017-12-02 DIAGNOSIS — H5213 Myopia, bilateral: Secondary | ICD-10-CM

## 2017-12-02 DIAGNOSIS — E119 Type 2 diabetes mellitus without complications: Secondary | ICD-10-CM | POA: Diagnosis not present

## 2017-12-02 DIAGNOSIS — H35033 Hypertensive retinopathy, bilateral: Secondary | ICD-10-CM | POA: Diagnosis not present

## 2017-12-02 DIAGNOSIS — I10 Essential (primary) hypertension: Secondary | ICD-10-CM | POA: Diagnosis not present

## 2017-12-02 DIAGNOSIS — Z9889 Other specified postprocedural states: Secondary | ICD-10-CM

## 2017-12-02 DIAGNOSIS — H43813 Vitreous degeneration, bilateral: Secondary | ICD-10-CM

## 2017-12-09 ENCOUNTER — Ambulatory Visit: Payer: 59 | Admitting: Family Medicine

## 2017-12-09 NOTE — Progress Notes (Deleted)
Alexandra Walker is a 62 y.o. female is here for follow up.  History of Present Illness:   HPI: Health Maintenance Due  Topic Date Due  . OPHTHALMOLOGY EXAM  01/18/2017   Depression screen PHQ 2/9 10/28/2016  Decreased Interest 1  Down, Depressed, Hopeless 3  PHQ - 2 Score 4  Altered sleeping 2  Tired, decreased energy 2  Change in appetite 2  Feeling bad or failure about yourself  2  Trouble concentrating 3  Moving slowly or fidgety/restless 0  Suicidal thoughts 0  PHQ-9 Score 15   PMHx, SurgHx, SocialHx, FamHx, Medications, and Allergies were reviewed in the Visit Navigator and updated as appropriate.   Patient Active Problem List   Diagnosis Date Noted  . Family history of early CAD 11/05/2017  . Attention deficit hyperactivity disorder (ADHD), combined type 10/19/2017  . Vitreous floaters of both eyes 10/02/2017  . Recurrent circadian rhythm sleep disorder, shift work type 03/31/2017  . Morbid obesity ([place]) 03/31/2017  . Cervical intraepithelial neoplasia grade 1 03/17/2017  . Female bladder prolapse 03/17/2017  . Irritable bowel syndrome 03/17/2017  . Class 2 obesity with serious comorbidity and body mass index (BMI) of 35.0 to 35.9 in adult 12/30/2016  . Circadian rhythm sleep disorder, shift work type 09/26/2016  . Fatty infiltration of liver 09/26/2016  . OSA on CPAP 09/26/2016  . PAF (paroxysmal atrial fibrillation) ([place]) 02/01/2016  . SVT (supraventricular tachycardia) ([place]) 01/31/2016  . Essential hypertension 01/31/2016  . Postmenopausal bleeding 10/03/2015  . LGSIL (low grade squamous intraepithelial dysplasia) 08/05/2015  . PMB (postmenopausal bleeding) 08/05/2015  . Dupuytren contracture 05/31/2015  . Elevated LFTs 05/31/2015  . Kyphosis, acquired 05/31/2015  . Premenstrual tension syndrome 05/31/2015  . Tenosynovitis of right foot 05/31/2015  . Allergic rhinitis 05/10/2015  . Cardiac dysrhythmia 05/10/2015  . Chronic depressive disorder 05/10/2015   . Edema 05/10/2015  . Gastroesophageal reflux disease without esophagitis 05/10/2015  . Hyperlipidemia associated with type 2 diabetes mellitus ([place]) 05/10/2015  . Microalbuminuria 05/10/2015  . Migraine headache without aura 05/10/2015  . Mixed hyperlipidemia 05/10/2015  . Vitamin D deficiency 05/10/2015  . PAC (premature atrial contraction) 09/19/2011   Social History   Tobacco Use  . Smoking status: Never Smoker  . Smokeless tobacco: Never Used  Substance Use Topics  . Alcohol use: Yes    Alcohol/week: 0.0 oz    Comment: social  . Drug use: No   Current Medications and Allergies:   Current Outpatient Medications:  .  amphetamine-dextroamphetamine (ADDERALL) 20 MG tablet, Take 1 tablet (20 mg total) by mouth 2 (two) times daily., Disp: 60 tablet, Rfl: 0 .  aspirin 325 MG tablet, Take 325 mg by mouth daily., Disp: , Rfl:  .  Blood Glucose Monitoring Suppl (FREESTYLE LITE) DEVI, Check blood sugar 2-3 times per day.  Dx:E11.21, Disp: 1 each, Rfl: 0 .  cholecalciferol (VITAMIN D) 1000 UNITS tablet, Take 1,000 Units by mouth daily., Disp: , Rfl:  .  cyclobenzaprine (FLEXERIL) 5 MG tablet, Take 1 tablet (5 mg total) by mouth at bedtime as needed for muscle spasms., Disp: 30 tablet, Rfl: 2 .  diclofenac sodium (VOLTAREN) 1 % GEL, APPLY 2 GRAMS TOPICALLY 2 TIMES DAILY AS NEEDED. NOTE: 2 GRAMS IS EQUIVALENT TO A NICKEL SIZED AMOUNT., Disp: , Rfl:  .  glucose blood (FREESTYLE LITE) test strip, Check blood sugar 2-3 times per day.  Dx:E11.21., Disp: 100 each, Rfl: 12 .  ibuprofen (ADVIL,MOTRIN) 200 MG tablet, Take 200 mg by  mouth every 6 (six) hours as needed., Disp: , Rfl:  .  Lancets (FREESTYLE) lancets, Check blood sugar 2-3 times per day.  Dx:E11.21, Disp: 100 each, Rfl: 12 .  lisdexamfetamine (VYVANSE) 30 MG capsule, Take 1 capsule (30 mg total) by mouth daily., Disp: 30 capsule, Rfl: 0 .  loratadine (CLARITIN) 10 MG tablet, Take by mouth., Disp: , Rfl:  .  losartan (COZAAR) 50 MG  tablet, Take 1 tablet (50 mg total) by mouth daily., Disp: 30 tablet, Rfl: 5 .  metFORMIN (GLUCOPHAGE XR) 750 MG 24 hr tablet, Take two daily before breakfast., Disp: 180 tablet, Rfl: 2 .  metoprolol succinate (TOPROL-XL) 25 MG 24 hr tablet, Take 1 tablet (25 mg total) by mouth daily., Disp: 90 tablet, Rfl: 2 .  Misc. Devices (SCD SOFT SLEEVES/KNEE LENGTH) MISC, To be used at home., Disp: 2 each, Rfl: 1 .  Omega-3 Fatty Acids (FISH OIL PO), Take 1 tablet by mouth daily., Disp: , Rfl:  .  saccharomyces boulardii (FLORASTOR) 250 MG capsule, Take 1 capsule (250 mg total) by mouth 2 (two) times daily., Disp: 60 capsule, Rfl: 0 .  simvastatin (ZOCOR) 40 MG tablet, Take 1 tablet (40 mg total) by mouth every evening., Disp: 30 tablet, Rfl: 5 .  sulfamethoxazole-trimethoprim (BACTRIM) 400-80 MG tablet, Take 1 tablet by mouth 2 (two) times daily., Disp: 20 tablet, Rfl: 0 .  Turmeric 450 MG CAPS, Take 900 mg by mouth daily., Disp: , Rfl:   Allergies  Allergen Reactions  . Ace Inhibitors Other (See Comments)    coughing  . Oxycodone-Acetaminophen Nausea And Vomiting  . Vicodin [Hydrocodone-Acetaminophen] Nausea And Vomiting  . Victoza [Liraglutide] Other (See Comments)    pancreatitis   Review of Systems   Pertinent items are noted in the HPI. Otherwise, ROS is negative.  Vitals:  There were no vitals filed for this visit.   There is no height or weight on file to calculate BMI.  Physical Exam:   Physical Exam Results for orders placed or performed in visit on 10/17/17  POCT urinalysis dipstick  Result Value Ref Range   Color, UA Yellow    Clarity, UA Clear    Glucose, UA Negative Negative   Bilirubin, UA Negative    Ketones, UA Negative    Spec Grav, UA >=1.030 (A) 1.010 - 1.025   Blood, UA Negative    pH, UA 5.5 5.0 - 8.0   Protein, UA Negative Negative   Urobilinogen, UA 0.2 0.2 or 1.0 E.U./dL   Nitrite, UA Positive    Leukocytes, UA Negative Negative   Appearance     Odor       Assessment and Plan:   There are no diagnoses linked to this encounter.  . Reviewed expectations re: course of current medical issues. . Discussed self-management of symptoms. . Outlined signs and symptoms indicating need for more acute intervention. . Patient verbalized understanding and all questions were answered. Marland Kitchen Health Maintenance issues including appropriate healthy diet, exercise, and smoking avoidance were discussed with patient. . See orders for this visit as documented in the electronic medical record. . Patient received an After Visit Summary.  Briscoe Deutscher, DO [place] 12/09/2017  Future Appointments  Date Time Provider [place]  12/09/2017  1:40 PM Briscoe Deutscher, DO [facility]  12/16/2017  1:00 PM Briscoe Deutscher, DO [facility]  04/01/2018 10:30 AM Dohmeier, Asencion Partridge, MD [facility] None  04/07/2018  1:30 PM Bernarda Caffey, MD [facility] None    ***  CMA served as Education administrator during this visit. History, Physical, and Plan performed by medical provider. The above documentation has been reviewed and is accurate and complete. Briscoe Deutscher, D.O.

## 2017-12-16 ENCOUNTER — Ambulatory Visit: Payer: 59 | Admitting: Family Medicine

## 2017-12-23 NOTE — Telephone Encounter (Signed)
Called insurance spoke with Alexandra Walker gave all information reference number [id number] should have response in 72 HRS

## 2017-12-25 NOTE — Progress Notes (Signed)
Alexandra Walker is a 62 y.o. female is here for follow up.  History of Present Illness:   Alexandra Walker, CMA acting as scribe for Dr. Briscoe Deutscher.   HPI: Patient in for follow up for medication evaluation. She has been taking the Adderall and has not noticed any big change in her blood pressure or HR. She does fell that it has been helpful for ADD. Sleeping well. Continues low carb diet. Not exercising regularly.   Health Maintenance Due  Topic Date Due  . INFLUENZA VACCINE  12/18/2017   Depression screen PHQ 2/9 10/28/2016  Decreased Interest 1  Down, Depressed, Hopeless 3  PHQ - 2 Score 4  Altered sleeping 2  Tired, decreased energy 2  Change in appetite 2  Feeling bad or failure about yourself  2  Trouble concentrating 3  Moving slowly or fidgety/restless 0  Suicidal thoughts 0  PHQ-9 Score 15   PMHx, SurgHx, SocialHx, FamHx, Medications, and Allergies were reviewed in the Visit Navigator and updated as appropriate.   Patient Active Problem List   Diagnosis Date Noted  . Family history of early CAD 11/05/2017  . Family history of glaucoma 10/27/2017  . Hyperopia with astigmatism and presbyopia, bilateral 10/27/2017  . Keratoconjunctivitis sicca of both eyes not specified as Sjogren's 10/27/2017  . Long term current use of oral hypoglycemic drug 10/27/2017  . Nuclear sclerotic cataract of both eyes 10/27/2017  . Attention deficit hyperactivity disorder (ADHD), combined type 10/19/2017  . Vitreous floaters of both eyes 10/02/2017  . Recurrent circadian rhythm sleep disorder, shift work type 03/31/2017  . Morbid obesity ([place]) 03/31/2017  . Cervical intraepithelial neoplasia grade 1 03/17/2017  . Female bladder prolapse 03/17/2017  . Irritable bowel syndrome 03/17/2017  . Class 2 obesity with serious comorbidity and body mass index (BMI) of 35.0 to 35.9 in adult 12/30/2016  . Fatty infiltration of liver 09/26/2016  . OSA on CPAP 09/26/2016  . PAF (paroxysmal  atrial fibrillation) ([place]) 02/01/2016  . SVT (supraventricular tachycardia) ([place]) 01/31/2016  . Essential hypertension 01/31/2016  . Type 2 diabetes mellitus without complication, without long-term current use of insulin ([place]) 01/31/2016  . Postmenopausal bleeding 10/03/2015  . LGSIL (low grade squamous intraepithelial dysplasia) 08/05/2015  . PMB (postmenopausal bleeding) 08/05/2015  . Dupuytren contracture 05/31/2015  . Elevated LFTs 05/31/2015  . Kyphosis, acquired 05/31/2015  . Premenstrual tension syndrome 05/31/2015  . Tenosynovitis of right foot 05/31/2015  . Allergic rhinitis 05/10/2015  . Cardiac dysrhythmia 05/10/2015  . Chronic depressive disorder 05/10/2015  . Edema 05/10/2015  . Gastroesophageal reflux disease without esophagitis 05/10/2015  . Hyperlipidemia associated with type 2 diabetes mellitus ([place]) 05/10/2015  . Microalbuminuria 05/10/2015  . Migraine headache without aura 05/10/2015  . Mixed hyperlipidemia 05/10/2015  . Vitamin D deficiency 05/10/2015  . PAC (premature atrial contraction) 09/19/2011   Social History   Tobacco Use  . Smoking status: Never Smoker  . Smokeless tobacco: Never Used  Substance Use Topics  . Alcohol use: Yes    Alcohol/week: 0.0 standard drinks    Comment: social  . Drug use: No   Current Medications and Allergies:   .  amphetamine-dextroamphetamine (ADDERALL) 20 MG tablet, Take 1 tablet (20 mg total) by mouth 2 (two) times daily., Disp: 60 tablet, Rfl: 0 .  aspirin 325 MG tablet, Take 325 mg by mouth daily., Disp: , Rfl:  .  cholecalciferol (VITAMIN D) 1000 UNITS tablet, Take 1,000 Units by mouth daily., Disp: , Rfl:  .  cyclobenzaprine (  FLEXERIL) 5 MG tablet, Take 1 tablet (5 mg total) by mouth at bedtime as needed for muscle spasms., Disp: 30 tablet, Rfl: 2 .  diclofenac sodium (VOLTAREN) 1 % GEL, APPLY 2 GRAMS TOPICALLY 2 TIMES DAILY AS NEEDED. NOTE: 2 GRAMS IS EQUIVALENT TO A NICKEL SIZED AMOUNT., Disp: , Rfl:  .   loratadine (CLARITIN) 10 MG tablet, Take by mouth., Disp: , Rfl:  .  losartan (COZAAR) 50 MG tablet, Take 1 tablet (50 mg total) by mouth daily., Disp: 30 tablet, Rfl: 5 .  metFORMIN (GLUCOPHAGE XR) 750 MG 24 hr tablet, Take two daily before breakfast., Disp: 180 tablet, Rfl: 2 .  metoprolol succinate (TOPROL-XL) 25 MG 24 hr tablet, Take 1 tablet (25 mg total) by mouth daily., Disp: 90 tablet, Rfl: 2 .  Omega-3 Fatty Acids (FISH OIL PO), Take 1 tablet by mouth daily., Disp: , Rfl:  .  saccharomyces boulardii (FLORASTOR) 250 MG capsule, Take 1 capsule (250 mg total) by mouth 2 (two) times daily., Disp: 60 capsule, Rfl: 0 .  simvastatin (ZOCOR) 40 MG tablet, Take 1 tablet (40 mg total) by mouth every evening., Disp: 30 tablet, Rfl: 5 .  Turmeric 450 MG CAPS, Take 900 mg by mouth daily., Disp: , Rfl:    Allergies  Allergen Reactions  . Ace Inhibitors Other (See Comments)    coughing  . Oxycodone-Acetaminophen Nausea And Vomiting  . Vicodin [Hydrocodone-Acetaminophen] Nausea And Vomiting  . Victoza [Liraglutide] Other (See Comments)    pancreatitis   Review of Systems   Pertinent items are noted in the HPI. Otherwise, ROS is negative.  Vitals:   Vitals:   12/26/17 1530  BP: 130/80  Pulse: 66  Temp: 98.9 F (37.2 C)  TempSrc: Oral  SpO2: 96%  Weight: 230 lb 3.2 oz (104.4 kg)  Height: 5\' 6"  (1.676 m)     Body mass index is 37.16 kg/m.  Physical Exam:   Physical Exam  Constitutional: She appears well-nourished.  HENT:  Head: Normocephalic and atraumatic.  Eyes: Pupils are equal, round, and reactive to light. EOM are normal.  Neck: Normal range of motion. Neck supple.  Cardiovascular: Normal rate, regular rhythm, normal heart sounds and intact distal pulses.  Pulmonary/Chest: Effort normal.  Abdominal: Soft.  Skin: Skin is warm.  Psychiatric: She has a normal mood and affect. Her behavior is normal.  Nursing note and vitals reviewed.  Assessment and Plan:   Alexandra Walker was  seen today for follow-up.  Diagnoses and all orders for this visit:  Attention deficit hyperactivity disorder (ADHD), combined type Comments: Okay trial Vyvanse 20 mg daily with intent to increase to 30 mg daily next month.  Orders: -     lisdexamfetamine (VYVANSE) 20 MG capsule; Take 1 capsule (20 mg total) by mouth daily.  Morbid obesity ([place]) Comments: Discussed RD specializing in Intuitive Eating. She will look into it.     . Reviewed expectations re: course of current medical issues. . Discussed self-management of symptoms. . Outlined signs and symptoms indicating need for more acute intervention. . Patient verbalized understanding and all questions were answered. Marland Kitchen Health Maintenance issues including appropriate healthy diet, exercise, and smoking avoidance were discussed with patient. . See orders for this visit as documented in the electronic medical record. . Patient received an After Visit Summary.  Briscoe Deutscher, DO [place] 12/28/2017  Future Appointments  Date Time Provider [place]  03/30/2018  1:00 PM Briscoe Deutscher, DO [facility]  04/01/2018 10:30 AM [place],  Asencion Partridge, MD [facility] None  04/07/2018  1:30 PM Bernarda Caffey, MD [facility] None   CMA served as scribe during this visit. History, Physical, and Plan performed by medical provider. The above documentation has been reviewed and is accurate and complete. Briscoe Deutscher, D.O.

## 2017-12-25 NOTE — Telephone Encounter (Signed)
Finally have received Thea Silversmith number [id number] is good for a max of 12 refills from 11/27/17 to 11/27/18

## 2017-12-26 ENCOUNTER — Ambulatory Visit (INDEPENDENT_AMBULATORY_CARE_PROVIDER_SITE_OTHER): Payer: 59 | Admitting: Family Medicine

## 2017-12-26 ENCOUNTER — Encounter: Payer: Self-pay | Admitting: Family Medicine

## 2017-12-26 VITALS — BP 130/80 | HR 66 | Temp 98.9°F | Ht 66.0 in | Wt 230.2 lb

## 2017-12-26 DIAGNOSIS — F902 Attention-deficit hyperactivity disorder, combined type: Secondary | ICD-10-CM | POA: Diagnosis not present

## 2017-12-26 MED ORDER — LISDEXAMFETAMINE DIMESYLATE 20 MG PO CAPS: 20.0000 mg | ORAL_CAPSULE | Freq: Every day | ORAL | 0 refills | Status: DC

## 2017-12-26 MED FILL — VYVANSE 20 MG CAPSULE: 20 | 30 days supply | Qty: 30 | Fill #0

## 2018-01-10 IMAGING — CT CT ABD-PELV W/ CM
2 of 5 series · 10 of 46 positions shown, 11 images · IV contrast (Iodine)
Comparison: CT 04/19/2013

CLINICAL DATA: RIGHT lower quadrant pain.  Hysterectomy October 2015.

EXAM:
CT ABDOMEN AND PELVIS WITH CONTRAST
TECHNIQUE: Multidetector CT imaging of the abdomen and pelvis was performed
using the standard protocol following bolus administration of
intravenous contrast.
CONTRAST:  100mL A3T587-RHH IOPAMIDOL (A3T587-RHH) INJECTION 61%

[Series 201: routine, idose (2) · axial · 0.77mm/px · z∈[+86,+471]mm · 7 of 99 slices shown, 8 images]
[im 11/99  soft-tissue]
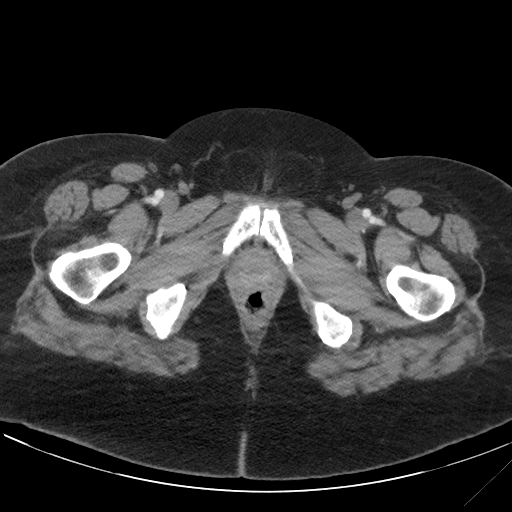
[im 11/99  bone]
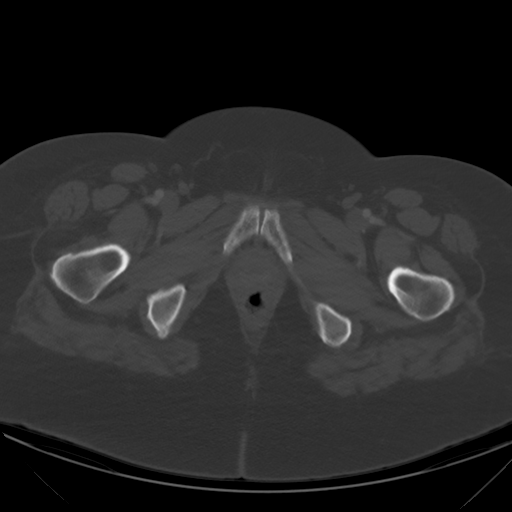
[im 21/99  soft-tissue]
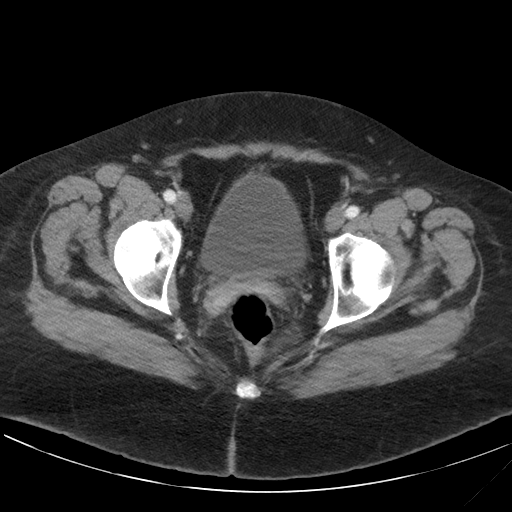
[im 37/99  soft-tissue]
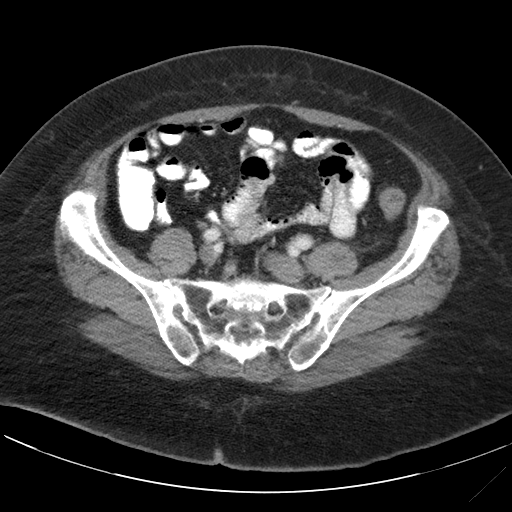
[im 52/99  soft-tissue]
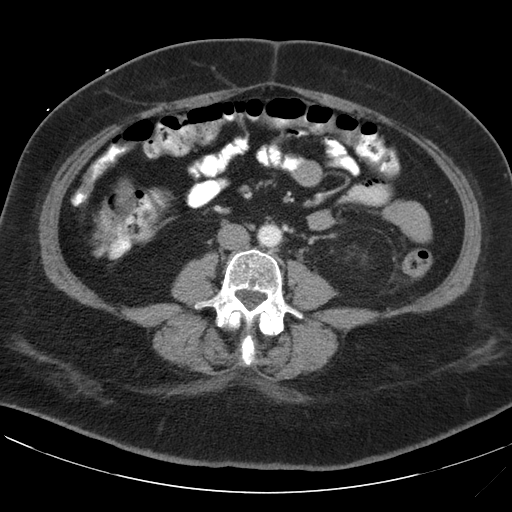
[im 62/99  soft-tissue]
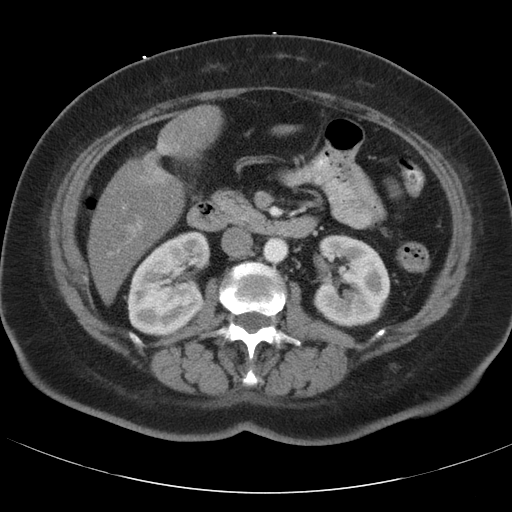
[im 78/99  soft-tissue]
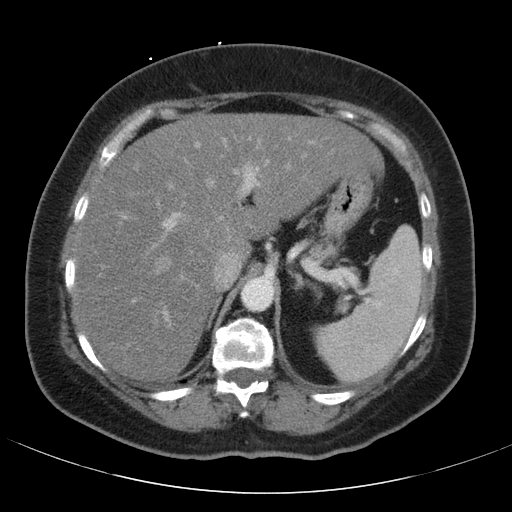
[im 88/99  soft-tissue]
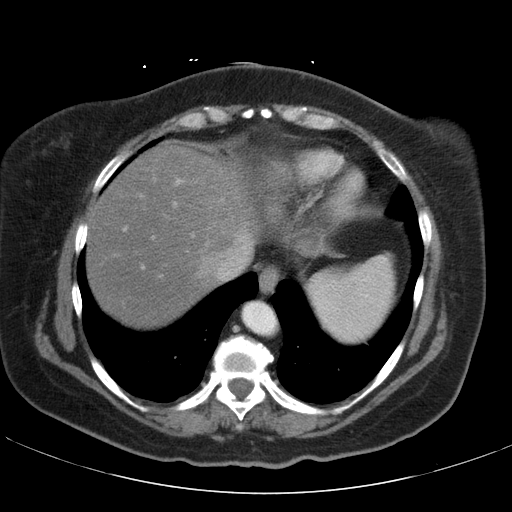

[Series 203: coronals, idose (2) · coronal · 0.45mm/px · 3 of 149 slices shown]
[im 50/149  soft-tissue]
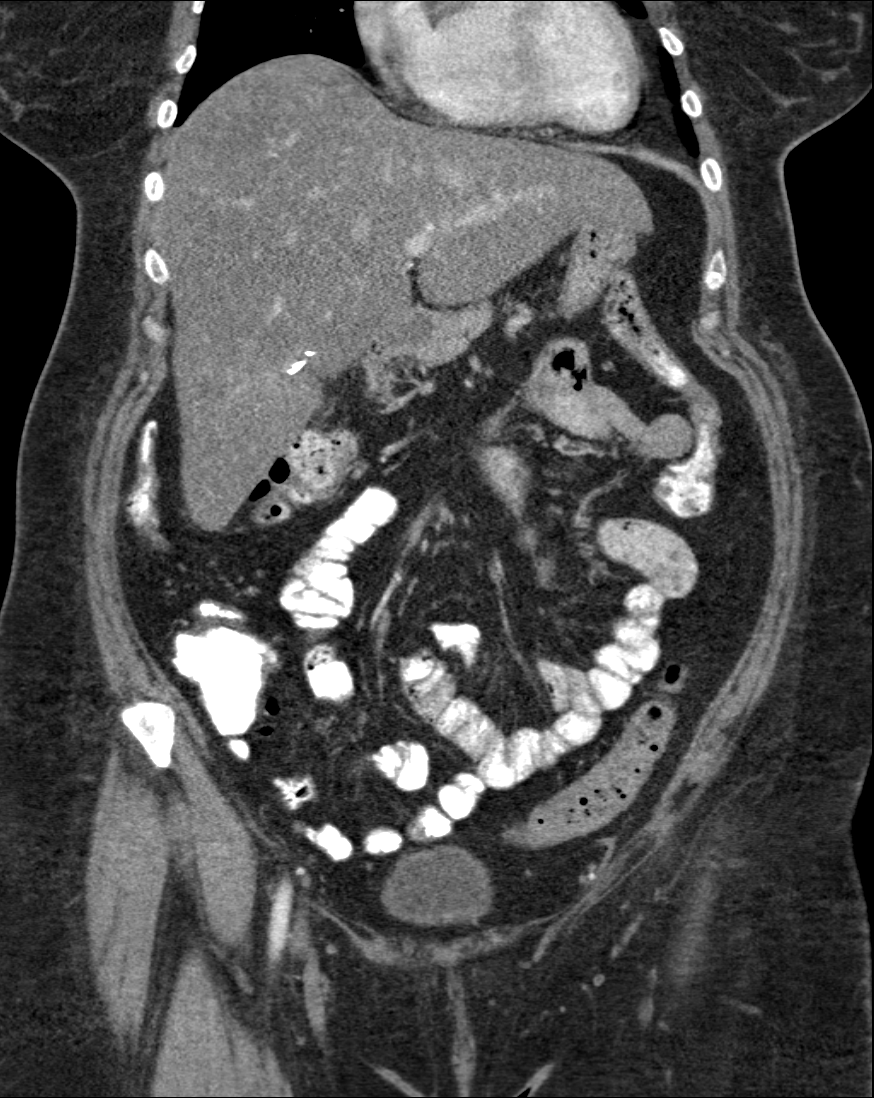
[im 66/149  soft-tissue]
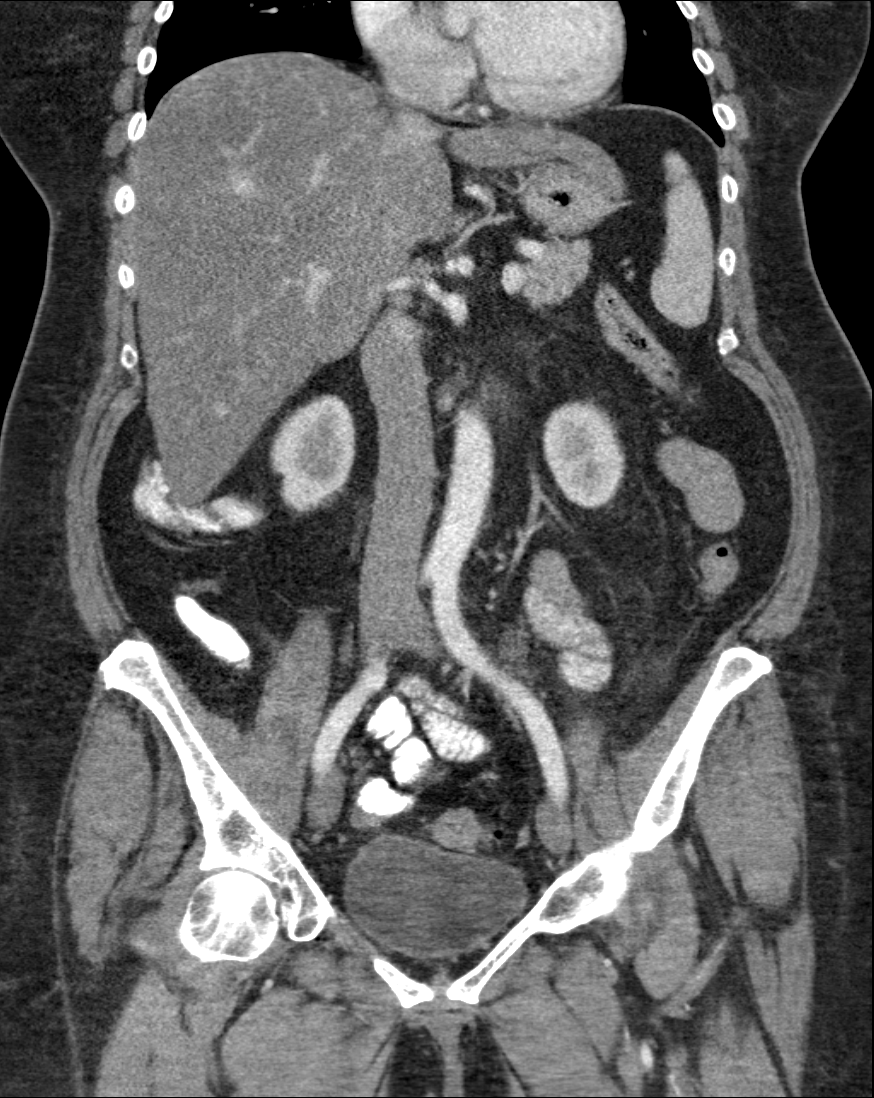
[im 83/149  soft-tissue]
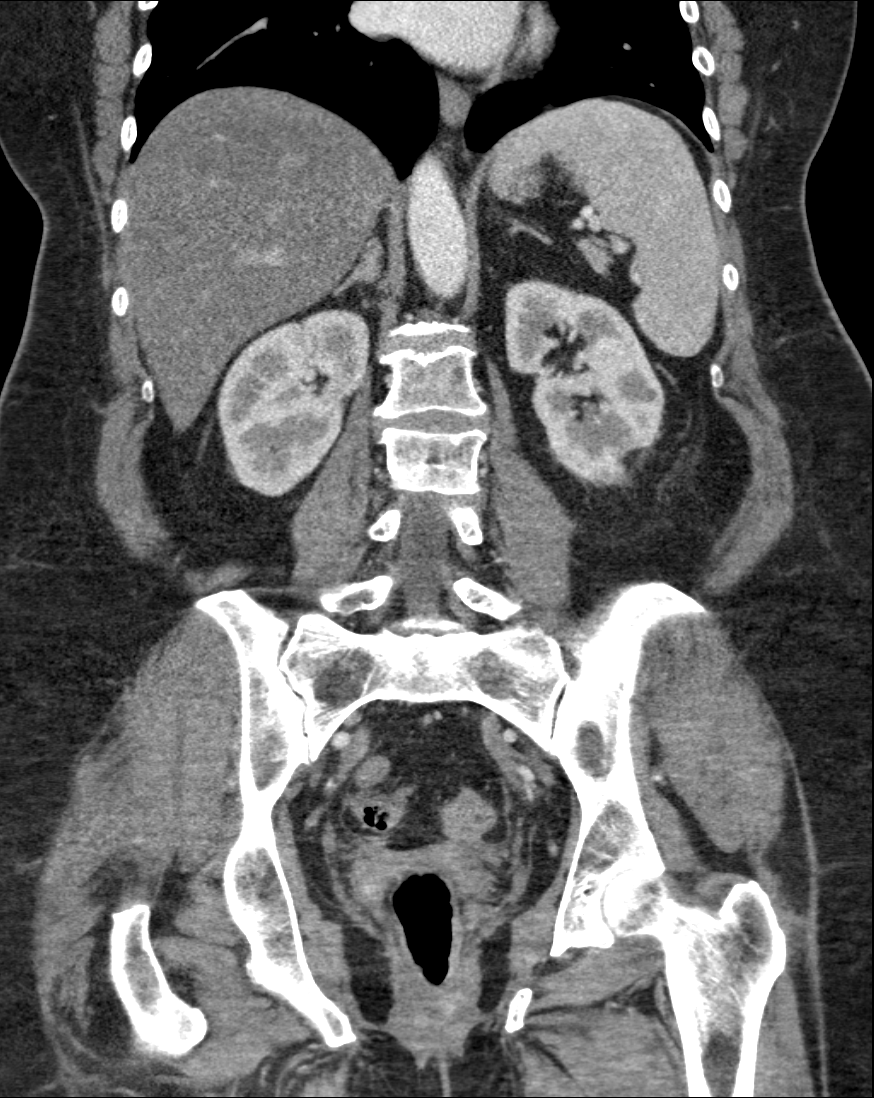

[10 of 46 positions shown; findings below may reference images not displayed]

FINDINGS: Lower chest: Lung bases are clear.

Hepatobiliary: Low-attenuation liver. No duct dilatation. Post
cholecystectomy. Common bile duct normal caliber.

Pancreas: Pancreas is normal. No ductal dilatation. No pancreatic
inflammation.

Spleen: Normal spleen

Adrenals/urinary tract: Adrenal glands normal. Nonobstructing 2 mm
calculus mid LEFT kidney. Larger 5 mm calculus lower pole LEFT
kidney. No ureterolithiasis or obstructive uropathy.

Small focus of gas within the bladder (image 80, series 201).

Stomach/Bowel: Stomach, small-bowel and cecum normal. Appendix not
identified. No periappendiceal inflammation. The colon and
rectosigmoid colon normal.

Vascular/Lymphatic: Abdominal aorta is normal caliber. There is no
retroperitoneal or periportal lymphadenopathy. No pelvic
lymphadenopathy.

Reproductive: Post hysterectomy anatomy. No pelvic abscess basilar
free fluid the pelvis. Again tiny amount of gas the bladder.

Other: No inguinal hernia or ventral hernia

Musculoskeletal: No aggressive osseous lesion.
IMPRESSION: 1. No pelvic abscess. Small free fluid the pelvis is favored
physiologic.
2. Tiny focus of gas in the bladder. Recommend correlation with
recent instrumentation.
3. Hepatic steatosis.
4. Nonobstructing LEFT renal calculi.

## 2018-02-10 ENCOUNTER — Other Ambulatory Visit: Payer: Self-pay | Admitting: Family Medicine

## 2018-02-10 MED FILL — SIMVASTATIN 40 MG TABLET: 40 | 30 days supply | Qty: 30 | Fill #0

## 2018-02-10 MED FILL — VYVANSE 20 MG CAPSULE: 20 | 30 days supply | Qty: 30 | Fill #0

## 2018-02-10 MED FILL — METFORMIN HCL ER 750 MG TAB: 750 | 90 days supply | Qty: 180 | Fill #0

## 2018-02-10 MED FILL — LOSARTAN POTASSIUM 50 MG TA: 50 | 30 days supply | Qty: 30 | Fill #0

## 2018-02-10 MED FILL — METOPROLOL SUCCINATE ER 25: 25 | 90 days supply | Qty: 90 | Fill #0

## 2018-02-26 NOTE — Progress Notes (Signed)
Alexandra Walker is a 62 y.o. female here for an acute visit.  History of Present Illness:   HPI:   Abdominal and flank pain. The pain is located in the RUQ, CVA right without radiation.  Pain is described as colicky, cramping, dull, pressure-like and sharp, and measures 6/10 in intensity.  Onset of pain was 1 day ago. Aggravating factors include eating. Alleviating factors include none. Associated symptoms include none.  Hx of Klebsiella pyelonephritis.   PMHx, SurgHx, SocialHx, Medications, and Allergies were reviewed in the Visit Navigator and updated as appropriate.  Current Medications:   .  aspirin 325 MG tablet, Take 325 mg by mouth daily., Disp: , Rfl:  .  Blood Glucose Monitoring Suppl (FREESTYLE LITE) DEVI, Check blood sugar 2-3 times per day.  Dx:E11.21, Disp: 1 each, Rfl: 0 .  cholecalciferol (VITAMIN D) 1000 UNITS tablet, Take 1,000 Units by mouth daily., Disp: , Rfl:  .  cyclobenzaprine (FLEXERIL) 5 MG tablet, Take 1 tablet (5 mg total) by mouth at bedtime as needed for muscle spasms., Disp: 30 tablet, Rfl: 2 .  diclofenac sodium (VOLTAREN) 1 % GEL, APPLY 2 GRAMS TOPICALLY 2 TIMES DAILY AS NEEDED. NOTE: 2 GRAMS IS EQUIVALENT TO A NICKEL SIZED AMOUNT., Disp: , Rfl:  .  glucose blood (FREESTYLE LITE) test strip, Check blood sugar 2-3 times per day.  Dx:E11.21., Disp: 100 each, Rfl: 12 .  ibuprofen (ADVIL,MOTRIN) 200 MG tablet, Take 200 mg by mouth every 6 (six) hours as needed., Disp: , Rfl:  .  Lancets (FREESTYLE) lancets, Check blood sugar 2-3 times per day.  Dx:E11.21, Disp: 100 each, Rfl: 12 .  lisdexamfetamine (VYVANSE) 20 MG capsule, Take 1 capsule (20 mg total) by mouth daily., Disp: 30 capsule, Rfl: 0 .  loratadine (CLARITIN) 10 MG tablet, Take by mouth., Disp: , Rfl:  .  losartan (COZAAR) 50 MG tablet, TAKE 1 TABLET (50 MG TOTAL) BY MOUTH DAILY., Disp: 30 tablet, Rfl: 5 .  metFORMIN (GLUCOPHAGE-XR) 750 MG 24 hr tablet, TAKE TWO TABLETS BY MOUTH DAILY BEFORE  BREAKFAST, Disp: 180 tablet, Rfl: 2 .  metoprolol succinate (TOPROL-XL) 25 MG 24 hr tablet, TAKE 1 TABLET (25 MG TOTAL) BY MOUTH DAILY., Disp: 90 tablet, Rfl: 2 .  Misc. Devices (SCD SOFT SLEEVES/KNEE LENGTH) MISC, To be used at home., Disp: 2 each, Rfl: 1 .  Omega-3 1000 MG CAPS, Take 1 capsule by mouth 4 (four) times daily., Disp: , Rfl:  .  saccharomyces boulardii (FLORASTOR) 250 MG capsule, Take 1 capsule (250 mg total) by mouth 2 (two) times daily., Disp: 60 capsule, Rfl: 0 .  simvastatin (ZOCOR) 40 MG tablet, TAKE 1 TABLET (40 MG TOTAL) BY MOUTH EVERY EVENING., Disp: 30 tablet, Rfl: 5 .  sulfamethoxazole-trimethoprim (BACTRIM) 400-80 MG tablet, Take 1 tablet by mouth 2 (two) times daily., Disp: 20 tablet, Rfl: 0 .  Turmeric 450 MG CAPS, Take 900 mg by mouth daily., Disp: , Rfl:    Allergies  Allergen Reactions  . Ace Inhibitors Other (See Comments)    coughing  . Oxycodone-Acetaminophen Nausea And Vomiting  . Vicodin [Hydrocodone-Acetaminophen] Nausea And Vomiting  . Victoza [Liraglutide] Other (See Comments)    pancreatitis   Review of Systems:   Pertinent items are noted in the HPI. Otherwise, ROS is negative.  Vitals:   Vitals:   02/27/18 1044  BP: 136/82  Pulse: 65  Temp: 98.1 F (36.7 C)  TempSrc: Oral  SpO2: 98%  Weight: 230 lb (104.3 kg)  Height:  5\' 6"  (1.676 m)     Body mass index is 37.12 kg/m.  Physical Exam:   Physical Exam  Constitutional: She appears well-nourished.  HENT:  Head: Normocephalic and atraumatic.  Eyes: Pupils are equal, round, and reactive to light. EOM are normal.  Neck: Normal range of motion. Neck supple.  Cardiovascular: Normal rate, regular rhythm, normal heart sounds and intact distal pulses.  Pulmonary/Chest: Effort normal.  Abdominal: Soft. There is tenderness in the right upper quadrant. There is no rigidity and no guarding.  Skin: Skin is warm.  Psychiatric: She has a normal mood and affect. Her behavior is normal.  Nursing  note and vitals reviewed.    Results for orders placed or performed in visit on 02/27/18  POCT Urinalysis Dip Manual  Result Value Ref Range   Spec Grav, UA 1.015 1.010 - 1.025   pH, UA 6.5 5.0 - 8.0   Leukocytes, UA Negative Negative   Nitrite, UA Negative Negative   Poct Protein Negative Negative, trace mg/dL   Poct Glucose Normal Normal mg/dL   Poct Ketones Negative Negative   Poct Urobilinogen Normal Normal mg/dL   Poct Bilirubin Negative Negative   Poct Blood Negative Negative, trace   Assessment and Plan:   Diagnoses and all orders for this visit:  Flank pain Comments: Hx of pyelonephritis with similar symptoms. Hx of moderate to severe fatty liver. Workup as below. CT today if labs not helpful.  Orders: -     POCT Urinalysis Dip Manual -     CBC with Differential/Platelet -     Comprehensive metabolic panel -     Urinalysis, Routine w reflex microscopic -     Lipase  Chronic UTI Comments: Recent UTI. History of frequent UTIs. Provided safety net Rx. Okay for patient to drop off urine sample prior to starting in the future.  Orders: -     sulfamethoxazole-trimethoprim (BACTRIM) 400-80 MG tablet; Take 1 tablet by mouth 2 (two) times daily. -     Urine Culture   . Reviewed expectations re: course of current medical issues. . Discussed self-management of symptoms. . Outlined signs and symptoms indicating need for more acute intervention. . Patient verbalized understanding and all questions were answered. Marland Kitchen Health Maintenance issues including appropriate healthy diet, exercise, and smoking avoidance were discussed with patient. . See orders for this visit as documented in the electronic medical record. . Patient received an After Visit Summary.  Briscoe Deutscher, DO [place] 02/27/2018

## 2018-02-27 ENCOUNTER — Encounter: Payer: Self-pay | Admitting: Family Medicine

## 2018-02-27 ENCOUNTER — Ambulatory Visit (INDEPENDENT_AMBULATORY_CARE_PROVIDER_SITE_OTHER): Payer: 59 | Admitting: Family Medicine

## 2018-02-27 VITALS — BP 136/82 | HR 65 | Temp 98.1°F | Ht 66.0 in | Wt 230.0 lb

## 2018-02-27 DIAGNOSIS — N390 Urinary tract infection, site not specified: Secondary | ICD-10-CM | POA: Diagnosis not present

## 2018-02-27 DIAGNOSIS — R109 Unspecified abdominal pain: Secondary | ICD-10-CM | POA: Diagnosis not present

## 2018-02-27 LAB — CBC WITH DIFFERENTIAL/PLATELET
Basophils Absolute: 0 10*3/uL (ref 0.0–0.1)
Basophils Relative: 0.6 % (ref 0.0–3.0)
Eosinophils Absolute: 0.2 10*3/uL (ref 0.0–0.7)
Eosinophils Relative: 3.5 % (ref 0.0–5.0)
HCT: 40.3 % (ref 36.0–46.0)
Hemoglobin: 13.2 g/dL (ref 12.0–15.0)
Lymphocytes Relative: 27.5 % (ref 12.0–46.0)
Lymphs Abs: 1.5 10*3/uL (ref 0.7–4.0)
MCHC: 32.7 g/dL (ref 30.0–36.0)
MCV: 84.6 fl (ref 78.0–100.0)
Monocytes Absolute: 0.4 10*3/uL (ref 0.1–1.0)
Monocytes Relative: 7.5 % (ref 3.0–12.0)
Neutro Abs: 3.4 10*3/uL (ref 1.4–7.7)
Neutrophils Relative %: 60.9 % (ref 43.0–77.0)
Platelets: 230 10*3/uL (ref 150.0–400.0)
RBC: 4.76 Mil/uL (ref 3.87–5.11)
RDW: 12.9 % (ref 11.5–15.5)
WBC: 5.5 10*3/uL (ref 4.0–10.5)

## 2018-02-27 LAB — POCT URINALYSIS DIPSTICK (MANUAL)
Leukocytes, UA: NEGATIVE
Nitrite, UA: NEGATIVE
Poct Bilirubin: NEGATIVE
Poct Blood: NEGATIVE
Poct Glucose: NORMAL mg/dL
Poct Ketones: NEGATIVE
Poct Protein: NEGATIVE mg/dL
Poct Urobilinogen: NORMAL mg/dL
Spec Grav, UA: 1.015 (ref 1.010–1.025)
pH, UA: 6.5 (ref 5.0–8.0)

## 2018-02-27 LAB — URINALYSIS, ROUTINE W REFLEX MICROSCOPIC
Bilirubin Urine: NEGATIVE
Hgb urine dipstick: NEGATIVE
Ketones, ur: NEGATIVE
Nitrite: NEGATIVE
RBC / HPF: NONE SEEN (ref 0–?)
Specific Gravity, Urine: 1.01 (ref 1.000–1.030)
Total Protein, Urine: NEGATIVE
Urine Glucose: NEGATIVE
Urobilinogen, UA: 0.2 (ref 0.0–1.0)
pH: 6.5 (ref 5.0–8.0)

## 2018-02-27 LAB — COMPREHENSIVE METABOLIC PANEL
ALT: 12 U/L (ref 0–35)
AST: 13 U/L (ref 0–37)
Albumin: 4.2 g/dL (ref 3.5–5.2)
Alkaline Phosphatase: 81 U/L (ref 39–117)
BUN: 21 mg/dL (ref 6–23)
CO2: 31 mEq/L (ref 19–32)
Calcium: 9.7 mg/dL (ref 8.4–10.5)
Chloride: 102 mEq/L (ref 96–112)
Creatinine, Ser: 0.67 mg/dL (ref 0.40–1.20)
GFR: 95.43 mL/min (ref >60.00)
Glucose, Bld: 147 mg/dL — ABNORMAL HIGH (ref 70–99)
Potassium: 4.1 mEq/L (ref 3.5–5.1)
Sodium: 139 mEq/L (ref 135–145)
Total Bilirubin: 0.3 mg/dL (ref 0.2–1.2)
Total Protein: 6.8 g/dL (ref 6.0–8.3)

## 2018-02-27 LAB — LIPASE: Lipase: 57 U/L (ref 11.0–59.0)

## 2018-02-27 MED ORDER — SULFAMETHOXAZOLE-TRIMETHOPRIM 800-160 MG PO TABS: 1.0000 | ORAL_TABLET | Freq: Two times a day (BID) | ORAL | 1 refills | Status: DC

## 2018-02-27 MED ORDER — SULFAMETHOXAZOLE-TRIMETHOPRIM 400-80 MG PO TABS: 1.0000 | ORAL_TABLET | Freq: Two times a day (BID) | ORAL | 0 refills | Status: DC

## 2018-02-27 MED FILL — SULFAMETHOXAZOLE-TMP SS TAB: 400-80 | 10 days supply | Qty: 20 | Fill #0

## 2018-02-27 MED FILL — SULFAMETHOXAZOLE-TMP DS TAB: 800-160 | 10 days supply | Qty: 20 | Fill #0

## 2018-02-27 NOTE — Addendum Note (Signed)
Addended by: Briscoe Deutscher R on: 02/27/2018 12:21 PM   Modules accepted: Orders

## 2018-02-28 LAB — URINE CULTURE
MICRO NUMBER:: 91225715
SPECIMEN QUALITY:: ADEQUATE

## 2018-03-01 ENCOUNTER — Encounter: Payer: Self-pay | Admitting: Family Medicine

## 2018-03-03 NOTE — Progress Notes (Signed)
Answered staff message. Hold on CT for now.

## 2018-03-06 ENCOUNTER — Other Ambulatory Visit: Payer: Self-pay | Admitting: Surgical

## 2018-03-06 ENCOUNTER — Encounter: Payer: Self-pay | Admitting: Surgical

## 2018-03-06 MED ORDER — CYCLOBENZAPRINE HCL 10 MG PO TABS: 10.0000 mg | ORAL_TABLET | Freq: Three times a day (TID) | ORAL | 0 refills | Status: DC | PRN

## 2018-03-09 ENCOUNTER — Ambulatory Visit (INDEPENDENT_AMBULATORY_CARE_PROVIDER_SITE_OTHER): Payer: 59 | Admitting: General Practice

## 2018-03-09 DIAGNOSIS — Z23 Encounter for immunization: Secondary | ICD-10-CM | POA: Diagnosis not present

## 2018-03-09 NOTE — Progress Notes (Signed)
Alexandra Walker here for flu  Injection.  Injection administered without complication.   Derinda Late, RN 03/09/2018  1:37 PM

## 2018-03-12 ENCOUNTER — Encounter: Payer: Self-pay | Admitting: Family Medicine

## 2018-03-16 ENCOUNTER — Encounter: Payer: Self-pay | Admitting: Family Medicine

## 2018-03-17 NOTE — Telephone Encounter (Signed)
Do you want me to tell her no need for fasting?

## 2018-03-18 DIAGNOSIS — G4733 Obstructive sleep apnea (adult) (pediatric): Secondary | ICD-10-CM | POA: Diagnosis not present

## 2018-03-23 MED FILL — LOSARTAN POTASSIUM 50 MG TA: 50 | 90 days supply | Qty: 90 | Fill #1

## 2018-03-23 MED FILL — SIMVASTATIN 40 MG TABLET: 40 | 90 days supply | Qty: 90 | Fill #1

## 2018-03-29 ENCOUNTER — Encounter: Payer: Self-pay | Admitting: Family Medicine

## 2018-03-29 DIAGNOSIS — K853 Drug induced acute pancreatitis without necrosis or infection: Secondary | ICD-10-CM | POA: Insufficient documentation

## 2018-03-29 DIAGNOSIS — Z9049 Acquired absence of other specified parts of digestive tract: Secondary | ICD-10-CM | POA: Insufficient documentation

## 2018-03-29 DIAGNOSIS — Z90710 Acquired absence of both cervix and uterus: Secondary | ICD-10-CM | POA: Insufficient documentation

## 2018-03-29 DIAGNOSIS — N200 Calculus of kidney: Secondary | ICD-10-CM | POA: Insufficient documentation

## 2018-03-29 DIAGNOSIS — Z1589 Genetic susceptibility to other disease: Secondary | ICD-10-CM | POA: Insufficient documentation

## 2018-03-29 DIAGNOSIS — Q159 Congenital malformation of eye, unspecified: Secondary | ICD-10-CM | POA: Insufficient documentation

## 2018-03-29 DIAGNOSIS — D6861 Antiphospholipid syndrome: Secondary | ICD-10-CM | POA: Insufficient documentation

## 2018-03-29 NOTE — Progress Notes (Signed)
Subjective:    Alexandra Walker is a 62 y.o. female and is here for a comprehensive physical exam  Health Maintenance Due  Topic Date Due  . HEMOGLOBIN A1C  03/22/2018   Current Outpatient Medications:  .  aspirin 325 MG tablet, Take 325 mg by mouth daily., Disp: , Rfl:  .  cholecalciferol (VITAMIN D) 1000 UNITS tablet, Take 1,000 Units by mouth daily., Disp: , Rfl:  .  cyclobenzaprine (FLEXERIL) 5 MG tablet, Take 1 tablet (5 mg total) by mouth at bedtime as needed for muscle spasms., Disp: 30 tablet, Rfl: 2 .  diclofenac sodium (VOLTAREN) 1 % GEL, APPLY 2 GRAMS TOPICALLY 2 TIMES DAILY AS NEEDED. NOTE: 2 GRAMS IS EQUIVALENT TO A NICKEL SIZED AMOUNT .  lisdexamfetamine (VYVANSE) 20 MG capsule, Take 1 capsule (20 mg total) by mouth daily., Disp: 30 capsule, Rfl: 0 .  loratadine (CLARITIN) 10 MG tablet, Take by mouth., Disp: , Rfl:  .  losartan (COZAAR) 50 MG tablet, TAKE 1 TABLET (50 MG TOTAL) BY MOUTH DAILY., Disp: 30 tablet, Rfl: 5 .  metFORMIN (GLUCOPHAGE-XR) 750 MG 24 hr tablet, TAKE TWO TABLETS BY MOUTH DAILY BEFORE BREAKFAST, Disp: 180 tablet, Rfl: 2 .  metoprolol succinate (TOPROL-XL) 25 MG 24 hr tablet, TAKE 1 TABLET (25 MG TOTAL) BY MOUTH DAILY., Disp: 90 tablet, Rfl: 2 .  Omega-3 1000 MG CAPS, Take 1 capsule by mouth 4 (four) times daily., Disp: , Rfl:  .  saccharomyces boulardii (FLORASTOR) 250 MG capsule, Take 1 capsule (250 mg total) by mouth 2 (two) times daily., Disp: 60 capsule, Rfl: 0 .  simvastatin (ZOCOR) 40 MG tablet, TAKE 1 TABLET (40 MG TOTAL) BY MOUTH EVERY EVENING., Disp: 30 tablet, Rfl: 5 .  Turmeric 450 MG CAPS, Take 900 mg by mouth daily., Disp: , Rfl:   PMHx, SurgHx, SocialHx, Medications, and Allergies were reviewed in the Visit Navigator and updated as appropriate.   Past Medical History:  Diagnosis Date  . 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 Cervical intraepithelial neoplasia grade 1 03/17/2017  . Chronic ankle edema   . Chronic depressive disorder   .  Circadian rhythm sleep disorder, shift work   . Diabetes ([place])   . Female bladder prolapse 03/17/2017  . GERD (gastroesophageal reflux disease)   . Glucose intolerance (impaired glucose tolerance)   . Hepatic steatosis   . HLA B27 (HLA B27 positive)   . HLA-B27 positive 03/29/2018  . Hx of pancreatitis   . Hyperlipidemia   . Hyperopia with astigmatism and presbyopia, bilateral 10/27/2017  . Hypertension   . IBS (irritable bowel syndrome)   . Ischemic colitis ([place])   . Keratoconjunctivitis sicca of both eyes not specified as Sjogren's 10/27/2017  . Kyphosis   . Lateral epicondylitis of right elbow   . LGSIL (low grade squamous intraepithelial dysplasia) 08/05/2015  . Migraine headache   . Migraine headache without aura 05/10/2015  . Osteoarthritis   . PAC (premature atrial contraction)   . Paronychia   . Paroxysmal atrial fibrillation (HCC)   . PMS (premenstrual syndrome)   . PONV (postoperative nausea and vomiting)   . Pyelonephritis 01/2016  . Sacroiliitis ([place])   . Seasonal allergies   . Sepsis ([place]) 01/2016  . SVT (supraventricular tachycardia) ([place])    a. 01/2016 - SVT, wide complex tachycardia, and brief afib in setting of severe sepsis.  . Trigger ring finger of left hand   . Vitamin D deficiency      Past Surgical History:  Procedure Laterality Date  . ABDOMINAL HYSTERECTOMY    . BLADDER SURGERY    . BREAST BIOPSY Left   . CERVICAL CONE BIOPSY    . CHOLECYSTECTOMY N/A 10/26/2014   Procedure: LAPAROSCOPIC CHOLECYSTECTOMY;  Surgeon: Coralie Keens, MD;  Location: [facility];  Service: General;  Laterality: N/A;  . COLONOSCOPY W/ BIOPSIES  multiple  . DIAGNOSTIC LAPAROSCOPY    . DILATION AND CURETTAGE OF UTERUS  07,11  . ESOPHAGOGASTRODUODENOSCOPY  multiple  . EYE SURGERY    . KNEE ARTHROSCOPY Left 11/2008  . LAPAROSCOPIC VAGINAL HYSTERECTOMY WITH SALPINGO OOPHORECTOMY Bilateral 10/03/2015   Procedure: LAPAROSCOPIC ASSISTED VAGINAL HYSTERECTOMY WITH  SALPINGECTOMY;  Surgeon: Eldred Manges, MD;  Location: [facility];  Service: Gynecology;  Laterality: Bilateral;  . LASIK  1990   RK  . LEFT FINGER SURGERY    . MASS EXCISION Left 12/24/2012   Procedure: LEFT EXCISION OF PALMAR MASS X TWO;  Surgeon: Roseanne Kaufman, MD;  Location: [facility];  Service: Orthopedics;  Laterality: Left;     Family History  Problem Relation Age of Onset  . Heart attack Father   . Heart disease Father   . Coronary artery disease Mother   . Hypertension Mother   . Heart disease Mother   . Heart attack Mother   . Diabetes Mother   . Hyperlipidemia Mother   . Kidney disease Mother   . Depression Mother   . Sleep apnea Mother   . Obesity Mother   . Hypertension Sister   . Hypertension Brother   . Colon cancer Neg Hx     Social History   Tobacco Use  . Smoking status: Never Smoker  . Smokeless tobacco: Never Used  Substance Use Topics  . Alcohol use: Yes    Alcohol/week: 0.0 standard drinks    Comment: social  . Drug use: No   Review of Systems:   Pertinent items are noted in the HPI. Otherwise, ROS is negative.  Objective:   BP 140/82   Pulse 61   Temp 98.6 F (37 C) (Oral)   Ht _0  (1.676 m)   Wt 231 lb 9.6 oz (105.1 kg)   LMP 05/20/2008   SpO2 97%   BMI 37.38 kg/m   General appearance: alert, cooperative and appears stated age. Head: normocephalic, without obvious abnormality, atraumatic. Neck: no adenopathy, supple, symmetrical, trachea midline; thyroid not enlarged, symmetric, no tenderness/mass/nodules. Lungs: clear to auscultation bilaterally. Heart: regular rate and rhythm. Abdomen: soft, non-tender; no masses,  no organomegaly. Extremities: extremities normal, atraumatic, no cyanosis or edema. Skin: skin color, texture, turgor normal, no rashes or lesions. Lymph: cervical, supraclavicular, and axillary nodes normal; no abnormal inguinal nodes palpated. Neurologic: grossly normal.                                       Assessment/Plan:   Alexandra Walker was seen today for annual exam.  Diagnoses and all orders for this visit:  Routine physical examination  Hypertension associated with diabetes ([place]) -     metoprolol succinate (TOPROL-XL) 50 MG 24 hr tablet; Take 1 tablet (50 mg total) by mouth daily. Take with or immediately following a meal.  PAC (premature atrial contraction) -     metoprolol succinate (TOPROL-XL) 50 MG 24 hr tablet; Take 1 tablet (50 mg total) by mouth daily. Take with or immediately following a meal.  Nonalcoholic fatty liver  disease -     Hemoglobin A1c -     Lipid panel -     Comprehensive metabolic panel -     Amb Referral to Hepatology  HLA-B27 positive  Drug-induced acute pancreatitis without infection or necrosis  OSA on CPAP, followed by Neurology/Sleep  Afib and SVT during an episode of sepsis secondary to pyelonephritis in October 2017 requiring hospitalization  History of hysterectomy in 2017  Left kidney, 12 mm nonobstructing calculus, on [name] 2017   Eye abnormalities  History of cholecystectomy  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HCC)  Vitamin D deficiency -     VITAMIN D 25 Hydroxy (Vit-D Deficiency, Fractures)  Vitamin B 12 deficiency -     Vitamin B12  Morbid obesity (HCC) -     buPROPion (WELLBUTRIN XL) 150 MG 24 hr tablet; Take 1 tablet (150 mg total) by mouth daily.  Primary insomnia -     ALPRAZolam (XANAX) 0.5 MG tablet; Take 1 tablet (0.5 mg total) by mouth at bedtime as needed for anxiety.   Patient Counseling: _0    Nutrition: Stressed importance of moderation in sodium/caffeine intake, saturated fat and cholesterol, caloric balance, sufficient intake of fresh fruits, vegetables, fiber, calcium, iron, and 1 mg of folate supplement per day (for females capable of pregnancy).  _1    Stressed the importance of regular exercise.   _2    Substance Abuse: Discussed cessation/primary prevention of tobacco, alcohol, or other drug use; driving or other  dangerous activities under the influence; availability of treatment for abuse.   _3    Injury prevention: Discussed safety belts, safety helmets, smoke detector, smoking near bedding or upholstery.   _4    Sexuality: Discussed sexually transmitted diseases, partner selection, use of condoms, avoidance of unintended pregnancy  and contraceptive alternatives.  _5    Dental health: Discussed importance of regular tooth brushing, flossing, and dental visits.  _6    Health maintenance and immunizations reviewed. Please refer to Health maintenance section.   Briscoe Deutscher, DO [place]

## 2018-03-30 ENCOUNTER — Ambulatory Visit: Payer: 59 | Admitting: Family Medicine

## 2018-03-30 ENCOUNTER — Encounter: Payer: Self-pay | Admitting: Family Medicine

## 2018-03-30 ENCOUNTER — Ambulatory Visit (INDEPENDENT_AMBULATORY_CARE_PROVIDER_SITE_OTHER): Payer: 59 | Admitting: Family Medicine

## 2018-03-30 VITALS — BP 140/82 | HR 61 | Temp 98.6°F | Ht 66.0 in | Wt 231.6 lb

## 2018-03-30 DIAGNOSIS — E559 Vitamin D deficiency, unspecified: Secondary | ICD-10-CM

## 2018-03-30 DIAGNOSIS — I491 Atrial premature depolarization: Secondary | ICD-10-CM | POA: Diagnosis not present

## 2018-03-30 DIAGNOSIS — K853 Drug induced acute pancreatitis without necrosis or infection: Secondary | ICD-10-CM | POA: Diagnosis not present

## 2018-03-30 DIAGNOSIS — I152 Hypertension secondary to endocrine disorders: Secondary | ICD-10-CM

## 2018-03-30 DIAGNOSIS — Z0000 Encounter for general adult medical examination without abnormal findings: Secondary | ICD-10-CM

## 2018-03-30 DIAGNOSIS — E538 Deficiency of other specified B group vitamins: Secondary | ICD-10-CM

## 2018-03-30 DIAGNOSIS — I10 Essential (primary) hypertension: Secondary | ICD-10-CM

## 2018-03-30 DIAGNOSIS — G4733 Obstructive sleep apnea (adult) (pediatric): Secondary | ICD-10-CM

## 2018-03-30 DIAGNOSIS — Z1589 Genetic susceptibility to other disease: Secondary | ICD-10-CM

## 2018-03-30 DIAGNOSIS — E1159 Type 2 diabetes mellitus with other circulatory complications: Secondary | ICD-10-CM | POA: Diagnosis not present

## 2018-03-30 DIAGNOSIS — Q159 Congenital malformation of eye, unspecified: Secondary | ICD-10-CM

## 2018-03-30 DIAGNOSIS — K760 Fatty (change of) liver, not elsewhere classified: Secondary | ICD-10-CM | POA: Diagnosis not present

## 2018-03-30 DIAGNOSIS — Z9989 Dependence on other enabling machines and devices: Secondary | ICD-10-CM

## 2018-03-30 DIAGNOSIS — N200 Calculus of kidney: Secondary | ICD-10-CM

## 2018-03-30 DIAGNOSIS — Z9049 Acquired absence of other specified parts of digestive tract: Secondary | ICD-10-CM

## 2018-03-30 DIAGNOSIS — F5101 Primary insomnia: Secondary | ICD-10-CM

## 2018-03-30 DIAGNOSIS — Z90710 Acquired absence of both cervix and uterus: Secondary | ICD-10-CM | POA: Diagnosis not present

## 2018-03-30 DIAGNOSIS — D6861 Antiphospholipid syndrome: Secondary | ICD-10-CM

## 2018-03-30 DIAGNOSIS — I480 Paroxysmal atrial fibrillation: Secondary | ICD-10-CM | POA: Diagnosis not present

## 2018-03-30 MED ORDER — BUPROPION HCL ER (XL) 150 MG PO TB24: 150.0000 mg | ORAL_TABLET | Freq: Every day | ORAL | 3 refills | Status: DC

## 2018-03-30 MED ORDER — ALPRAZOLAM 0.5 MG PO TABS: 0.5000 mg | ORAL_TABLET | Freq: Every evening | ORAL | 0 refills | Status: DC | PRN

## 2018-03-30 MED FILL — ALPRAZolam 0.5 MG TABS: 0.5 | 20 days supply | Qty: 20 | Fill #0

## 2018-03-30 MED FILL — buPROPion HCL ER (XL) 150 M: 150 | 90 days supply | Qty: 90 | Fill #0

## 2018-03-31 ENCOUNTER — Encounter: Payer: Self-pay | Admitting: Family Medicine

## 2018-03-31 LAB — LIPID PANEL
Cholesterol: 137 mg/dL (ref 0–200)
HDL: 49.8 mg/dL (ref >39.00)
LDL Cholesterol: 69 mg/dL (ref 0–99)
NonHDL: 86.72
Total CHOL/HDL Ratio: 3
Triglycerides: 87 mg/dL (ref 0.0–149.0)
VLDL: 17.4 mg/dL (ref 0.0–40.0)

## 2018-03-31 LAB — COMPREHENSIVE METABOLIC PANEL
ALT: 15 U/L (ref 0–35)
AST: 16 U/L (ref 0–37)
Albumin: 4.4 g/dL (ref 3.5–5.2)
Alkaline Phosphatase: 90 U/L (ref 39–117)
BUN: 14 mg/dL (ref 6–23)
CO2: 27 mEq/L (ref 19–32)
Calcium: 9.6 mg/dL (ref 8.4–10.5)
Chloride: 103 mEq/L (ref 96–112)
Creatinine, Ser: 0.65 mg/dL (ref 0.40–1.20)
GFR: 98.8 mL/min (ref >60.00)
Glucose, Bld: 120 mg/dL — ABNORMAL HIGH (ref 70–99)
Potassium: 4.5 mEq/L (ref 3.5–5.1)
Sodium: 138 mEq/L (ref 135–145)
Total Bilirubin: 0.6 mg/dL (ref 0.2–1.2)
Total Protein: 7 g/dL (ref 6.0–8.3)

## 2018-03-31 LAB — VITAMIN B12: Vitamin B-12: 373 pg/mL (ref 211–911)

## 2018-03-31 LAB — VITAMIN D 25 HYDROXY (VIT D DEFICIENCY, FRACTURES): VITD: 59.05 ng/mL (ref 30.00–100.00)

## 2018-03-31 LAB — HEMOGLOBIN A1C: Hgb A1c MFr Bld: 5.8 % (ref 4.6–6.5)

## 2018-03-31 MED ORDER — METOPROLOL SUCCINATE ER 50 MG PO TB24: 50.0000 mg | ORAL_TABLET | Freq: Every day | ORAL | 2 refills | Status: DC

## 2018-03-31 NOTE — Assessment & Plan Note (Signed)
Worsened. Will increase Toprol to 50 mg daily again.

## 2018-03-31 NOTE — Assessment & Plan Note (Signed)
Patient would like to start going to a local Hepatologist.

## 2018-04-01 ENCOUNTER — Encounter: Payer: Self-pay | Admitting: Family Medicine

## 2018-04-01 ENCOUNTER — Ambulatory Visit (INDEPENDENT_AMBULATORY_CARE_PROVIDER_SITE_OTHER): Payer: 59 | Admitting: Family Medicine

## 2018-04-01 ENCOUNTER — Ambulatory Visit: Payer: 59 | Admitting: Neurology

## 2018-04-01 VITALS — BP 138/84 | HR 89 | Temp 100.2°F | Ht 66.0 in | Wt 231.0 lb

## 2018-04-01 DIAGNOSIS — R319 Hematuria, unspecified: Secondary | ICD-10-CM

## 2018-04-01 DIAGNOSIS — R509 Fever, unspecified: Secondary | ICD-10-CM | POA: Diagnosis not present

## 2018-04-01 DIAGNOSIS — R52 Pain, unspecified: Secondary | ICD-10-CM | POA: Diagnosis not present

## 2018-04-01 LAB — POCT URINALYSIS DIPSTICK
BILIRUBIN UA: NEGATIVE
Glucose, UA: NEGATIVE
Ketones, UA: NEGATIVE
LEUKOCYTES UA: NEGATIVE
Nitrite, UA: NEGATIVE
PH UA: 6 (ref 5.0–8.0)
Protein, UA: NEGATIVE
Spec Grav, UA: 1.015 (ref 1.010–1.025)
UROBILINOGEN UA: 0.2 U/dL

## 2018-04-01 LAB — POC INFLUENZA A&B (BINAX/QUICKVUE)
INFLUENZA A, POC: NEGATIVE
INFLUENZA B, POC: NEGATIVE

## 2018-04-01 NOTE — Progress Notes (Signed)
   Subjective:  Alexandra Walker is a 62 y.o. female who presents today for same-day appointment with a chief complaint of fever.   HPI:  Fever, Acute problem Symptoms started last night.  Stable over that time.  Associated with headache, body aches, and fever.  No other respiratory symptoms.  No cough or sneeze.  No sore throat.  No urinary symptoms.  Patient has a history of pyelonephritis with sepsis and is concerned that she could potentially have a UTI.  No specific treatments tried.  No obvious alleviating or aggravating factors.  ROS: Per HPI  PMH: She reports that she has never smoked. She has never used smokeless tobacco. She reports that she drinks alcohol. She reports that she does not use drugs.  Objective:  Physical Exam: BP 138/84 (BP Location: Left Arm, Patient Position: Sitting, Cuff Size: Normal)   Pulse 89   Temp 100.2 F (37.9 C) (Oral)   Ht 5\' 6"  (1.676 m)   Wt 231 lb (104.8 kg)   LMP 05/20/2008   SpO2 99%   BMI 37.28 kg/m   Gen: NAD, resting comfortably CV: RRR with no murmurs appreciated Pulm: NWOB, CTAB with no crackles, wheezes, or rhonchi GI: Normal bowel sounds present. Soft, Nontender, Nondistended. MSK: No edema, cyanosis, or clubbing noted Skin: Warm, dry  Results for orders placed or performed in visit on 04/01/18 (from the past 24 hour(s))  POCT urinalysis dipstick     Status: None   Collection Time: 04/01/18  9:56 AM  Result Value Ref Range   Color, UA Yellow    Clarity, UA Clear    Glucose, UA Negative Negative   Bilirubin, UA Negative    Ketones, UA Negative    Spec Grav, UA 1.015 1.010 - 1.025   Blood, UA 2+    pH, UA 6.0 5.0 - 8.0   Protein, UA Negative Negative   Urobilinogen, UA 0.2 0.2 or 1.0 E.U./dL   Nitrite, UA Negative    Leukocytes, UA Negative Negative   Appearance     Odor    POC Influenza A&B(BINAX/QUICKVUE)     Status: None   Collection Time: 04/01/18 10:15 AM  Result Value Ref Range   Influenza A, POC Negative  Negative   Influenza B, POC Negative Negative     Assessment/Plan:  Fever Unclear etiology.  No obvious source on her physical exam.  Rapid flu negative.  She does have 2+ blood on her point-of-care UA.  This possibly could represent early cystitis or could possibly be due to her her known nephrolithiasis.  Given her history of fever, muscle aches, and history of sepsis due to pyelonephritis in the past, will initiate antibiotic therapy today until culture results return.  Patient has Bactrim at home that she will start.  Encouraged good oral hydration.  Recommended Tylenol and/or Motrin as needed.  Algis Greenhouse. Jerline Pain, MD 04/01/2018 11:04 AM

## 2018-04-02 ENCOUNTER — Encounter: Payer: Self-pay | Admitting: Neurology

## 2018-04-04 ENCOUNTER — Other Ambulatory Visit: Payer: Self-pay | Admitting: Family Medicine

## 2018-04-04 LAB — URINE CULTURE
MICRO NUMBER:: 91367496
SPECIMEN QUALITY:: ADEQUATE

## 2018-04-04 MED ORDER — CIPROFLOXACIN HCL 500 MG PO TABS: 500.0000 mg | ORAL_TABLET | Freq: Two times a day (BID) | ORAL | 0 refills | Status: DC

## 2018-04-04 NOTE — Progress Notes (Signed)
Hi Randye,  Your urine culture grew ecoli and klebsiella. Looks like the ecoli is resistant to bactrim. I sent in 7 days of cipro 500mg  bid to your pharmacy.  Hope you are feeling better! Please let me or Danae Chen know if the cipro doesn't take care of your symptoms.  -Hetty Blend

## 2018-04-06 ENCOUNTER — Encounter: Payer: Self-pay | Admitting: Family Medicine

## 2018-04-06 NOTE — Progress Notes (Signed)
[facility] Note  04/07/2018     CHIEF COMPLAINT Patient presents for Retina Follow Up   HISTORY OF PRESENT ILLNESS: Alexandra Walker is a 62 y.o. female who presents to the clinic today for:   HPI    Retina Follow Up    In both eyes.  This started 4 months ago.  Since onset it is stable.  I, the attending physician,  performed the HPI with the patient and updated documentation appropriately.          Comments    F/U PVD OU. Patient states she has occasional flashes mostly noticed at "night", denies ocular pain. Pt is using ReFresh gtt's, Naphcon eye gtt's PRN.Bs WINL per patient A1C 5.8 (2 mos ago).       Last edited by Bernarda Caffey, MD on 04/07/2018  2:27 PM. (History)     pt states vision is the same, she states she sees flashes only at night, she also states that she sees a few more floaters and one is particularly fuzzy and makes her vision fuzzy when it floaters by  Referring physician: Briscoe Deutscher, [address], [place] [address]  HISTORICAL INFORMATION:   Selected notes from the MEDICAL RECORD NUMBER Referred by Briscoe Deutscher, DO for concern of floaters and DM exam LEE:  Ocular Hx- PMH-Arthritis, DM (taking Metformin), High Cholesterol, HTN    CURRENT MEDICATIONS: No current outpatient medications on file. (Ophthalmic Drugs)   No current facility-administered medications for this visit.  (Ophthalmic Drugs)   Current Outpatient Medications (Other)  Medication Sig  . ALPRAZolam (XANAX) 0.5 MG tablet Take 1 tablet (0.5 mg total) by mouth at bedtime as needed for anxiety.  Marland Kitchen aspirin 325 MG tablet Take 325 mg by mouth daily.  . Blood Glucose Monitoring Suppl (FREESTYLE LITE) DEVI Check blood sugar 2-3 times per day.  Dx:E11.21  . buPROPion (WELLBUTRIN XL) 150 MG 24 hr tablet Take 1 tablet (150 mg total) by mouth daily.  . cholecalciferol (VITAMIN D) 1000 UNITS tablet Take 1,000 Units by mouth daily.  . ciprofloxacin  (CIPRO) 500 MG tablet Take 1 tablet (500 mg total) by mouth 2 (two) times daily.  . cyclobenzaprine (FLEXERIL) 5 MG tablet Take 1 tablet (5 mg total) by mouth at bedtime as needed for muscle spasms.  . diclofenac sodium (VOLTAREN) 1 % GEL APPLY 2 GRAMS TOPICALLY 2 TIMES DAILY AS NEEDED. NOTE: 2 GRAMS IS EQUIVALENT TO A NICKEL SIZED AMOUNT.  Marland Kitchen glucose blood (FREESTYLE LITE) test strip Check blood sugar 2-3 times per day.  Dx:E11.21.  . ibuprofen (ADVIL,MOTRIN) 200 MG tablet Take 200 mg by mouth every 6 (six) hours as needed.  . Lancets (FREESTYLE) lancets Check blood sugar 2-3 times per day.  Dx:E11.21  . lisdexamfetamine (VYVANSE) 20 MG capsule Take 1 capsule (20 mg total) by mouth daily.  Marland Kitchen losartan (COZAAR) 50 MG tablet TAKE 1 TABLET (50 MG TOTAL) BY MOUTH DAILY.  . metFORMIN (GLUCOPHAGE-XR) 750 MG 24 hr tablet TAKE TWO TABLETS BY MOUTH DAILY BEFORE BREAKFAST  . metoprolol succinate (TOPROL-XL) 50 MG 24 hr tablet Take 1 tablet (50 mg total) by mouth daily. Take with or immediately following a meal.  . Misc. Devices (SCD SOFT SLEEVES/KNEE LENGTH) MISC To be used at home.  . Omega-3 1000 MG CAPS Take 1 capsule by mouth 4 (four) times daily.  Marland Kitchen saccharomyces boulardii (FLORASTOR) 250 MG capsule Take 1 capsule (250 mg total) by mouth 2 (two) times  daily.  . simvastatin (ZOCOR) 40 MG tablet TAKE 1 TABLET (40 MG TOTAL) BY MOUTH EVERY EVENING.  . Turmeric 450 MG CAPS Take 900 mg by mouth daily.  Marland Kitchen amoxicillin-clavulanate (AUGMENTIN) 875-125 MG tablet Take 1 tablet by mouth 2 (two) times daily.   No current facility-administered medications for this visit.  (Other)      REVIEW OF SYSTEMS: ROS    Positive for: Endocrine, Eyes   Negative for: Constitutional, Gastrointestinal, Neurological, Skin, Genitourinary, Musculoskeletal, HENT, Cardiovascular, Respiratory, Psychiatric, Allergic/Imm, Heme/Lymph   Last edited by Zenovia Jordan, LPN on 70/17/7939  0:30 PM. (History)        ALLERGIES Allergies  Allergen Reactions  . Ace Inhibitors Other (See Comments)    coughing  . Oxycodone-Acetaminophen Nausea And Vomiting  . Vicodin [Hydrocodone-Acetaminophen] Nausea And Vomiting  . Victoza [Liraglutide] Other (See Comments)    pancreatitis    PAST MEDICAL HISTORY Past Medical History:  Diagnosis Date  . 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 Cervical intraepithelial neoplasia grade 1 03/17/2017  . Chronic ankle edema   . Chronic depressive disorder   . Circadian rhythm sleep disorder, shift work   . Diabetes ([place])   . Female bladder prolapse 03/17/2017  . GERD (gastroesophageal reflux disease)   . Glucose intolerance (impaired glucose tolerance)   . Hepatic steatosis   . HLA B27 (HLA B27 positive)   . HLA-B27 positive 03/29/2018  . Hx of pancreatitis   . Hyperlipidemia   . Hyperopia with astigmatism and presbyopia, bilateral 10/27/2017  . Hypertension   . IBS (irritable bowel syndrome)   . Ischemic colitis ([place])   . Keratoconjunctivitis sicca of both eyes not specified as Sjogren's 10/27/2017  . Kyphosis   . Lateral epicondylitis of right elbow   . LGSIL (low grade squamous intraepithelial dysplasia) 08/05/2015  . Migraine headache   . Migraine headache without aura 05/10/2015  . Osteoarthritis   . PAC (premature atrial contraction)   . Paronychia   . Paroxysmal atrial fibrillation (H[facility])   . PMS (premenstrual syndrome)   . PONV (postoperative nausea and vomiting)   . Pyelonephritis 01/2016  . Sacroiliitis ([place])   . Seasonal allergies   . Sepsis ([place]) 01/2016  . SVT (supraventricular tachycardia) ([place])    a. 01/2016 - SVT, wide complex tachycardia, and brief afib in setting of severe sepsis.  . Trigger ring finger of left hand   . Vitamin D deficiency    Past Surgical History:  Procedure Laterality Date  . ABDOMINAL HYSTERECTOMY    . BLADDER SURGERY    . BREAST BIOPSY Left   . CERVICAL CONE BIOPSY    . CHOLECYSTECTOMY N/A 10/26/2014    Procedure: LAPAROSCOPIC CHOLECYSTECTOMY;  Surgeon: Coralie Keens, MD;  Location: [facility];  Service: General;  Laterality: N/A;  . COLONOSCOPY W/ BIOPSIES  multiple  . DIAGNOSTIC LAPAROSCOPY    . DILATION AND CURETTAGE OF UTERUS  07,11  . ESOPHAGOGASTRODUODENOSCOPY  multiple  . EYE SURGERY    . KNEE ARTHROSCOPY Left 11/2008  . LAPAROSCOPIC VAGINAL HYSTERECTOMY WITH SALPINGO OOPHORECTOMY Bilateral 10/03/2015   Procedure: LAPAROSCOPIC ASSISTED VAGINAL HYSTERECTOMY WITH SALPINGECTOMY;  Surgeon: Eldred Manges, MD;  Location: [facility];  Service: Gynecology;  Laterality: Bilateral;  . LASIK  1990   RK  . LEFT FINGER SURGERY    . MASS EXCISION Left 12/24/2012   Procedure: LEFT EXCISION OF PALMAR MASS X TWO;  Surgeon: Roseanne Kaufman, MD;  Location: [facility];  Service: Orthopedics;  Laterality: Left;  FAMILY HISTORY Family History  Problem Relation Age of Onset  . Heart attack Father   . Heart disease Father   . Coronary artery disease Mother   . Hypertension Mother   . Heart disease Mother   . Heart attack Mother   . Diabetes Mother   . Hyperlipidemia Mother   . Kidney disease Mother   . Depression Mother   . Sleep apnea Mother   . Obesity Mother   . Hypertension Sister   . Hypertension Brother   . Colon cancer Neg Hx     SOCIAL HISTORY Social History   Tobacco Use  . Smoking status: Never Smoker  . Smokeless tobacco: Never Used  Substance Use Topics  . Alcohol use: Yes    Alcohol/week: 0.0 standard drinks    Comment: social  . Drug use: No         OPHTHALMIC EXAM:  Base Eye Exam    Visual Acuity (Snellen - Linear)      Right Left   Dist cc 20/40 20/30 +2   Dist ph cc 20/30 +1 20/25   Correction:  Glasses       Tonometry (Tonopen, 1:33 PM)      Right Left   Pressure 15 14       Pupils      Dark Light Shape React APD   Right 4 3 Round Brisk None   Left 4 3 Round Brisk None       Visual Fields (Counting fingers)       Left Right    Full Full       Extraocular Movement      Right Left    Full, Ortho Full, Ortho       Neuro/Psych    Oriented x3:  Yes   Mood/Affect:  Normal       Dilation    Both eyes:  1.0% Mydriacyl, 2.5% Phenylephrine @ 1:25 PM        Slit Lamp and Fundus Exam    Slit Lamp Exam      Right Left   Lids/Lashes Dermatochalasis - upper lid, Meibomian gland dysfunction Dermatochalasis - upper lid, Meibomian gland dysfunction   Conjunctiva/Sclera White and quiet White and quiet   Cornea 18 cut RK, sub-epi haze and scarring inferior half 16 cut RK with scattered patches of K haze   Anterior Chamber Deep and quiet Deep and quiet   Iris Round and dilated Round and dilated   Lens 2+ Nuclear sclerosis, 2+ Cortical cataract, 1+ pigment on anterior capsule 2+ Nuclear sclerosis, 2+ Cortical cataract   Vitreous Vitreous syneresis, Posterior vitreous detachment Vitreous syneresis, Posterior vitreous detachment, Weiss ring       Fundus Exam      Right Left   Disc Pink and Sharp Pink and Sharp, mild tilt   C/D Ratio 0.45 0.4   Macula Blunted foveal reflex, +ERM, mild Retinal pigment epithelial mottling, No heme or edema Blunted foveal reflex, Retinal pigment epithelial mottling, No heme or edema   Vessels Vascular attenuation, AV crossing changes Vascular attenuation   Periphery Attached, inferior cobblestoning, peripheral cystoid degeneration Attached, inferior pigmented cobblestoning from 0430 to 0700, scattered peripheral cystiod degeneration          IMAGING AND PROCEDURES  Imaging and Procedures for '@TODAY' @  OCT, Retina - OU - Both Eyes       Right Eye Quality was good. Central Foveal Thickness: 304. Progression has been stable. Findings include normal foveal contour, no IRF,  no SRF, epiretinal membrane (Interval progression of ERM and blunting of foveal contour).   Left Eye Quality was good. Central Foveal Thickness: 281. Progression has been stable. Findings include  normal foveal contour, no IRF, no SRF.   Notes *Images captured and stored on drive  Diagnosis / Impression:  NFP, No IRF/SRF, No DME OU  Clinical management:  See below  Abbreviations: NFP - Normal foveal profile. CME - cystoid macular edema. PED - pigment epithelial detachment. IRF - intraretinal fluid. SRF - subretinal fluid. EZ - ellipsoid zone. ERM - epiretinal membrane. ORA - outer retinal atrophy. ORT - outer retinal tubulation. SRHM - subretinal hyper-reflective material                  ASSESSMENT/PLAN:    ICD-10-CM   1. Posterior vitreous detachment of both eyes H43.813   2. Diabetes mellitus type 2 without retinopathy ([place]) E11.9   3. Essential hypertension I10   4. Hypertensive retinopathy of both eyes H35.033   5. Retinal edema H35.81 OCT, Retina - OU - Both Eyes  6. High myopia, bilateral H52.13   7. History of radial keratotomy Z98.890   8. Combined forms of age-related cataract of both eyes H25.813     1. PVD / vitreous syneresis OU  Stable  Discussed findings and prognosis  No RT or RD on 360 scleral depressed exam  Reviewed s/s of RT/RD  Strict return precautions for any such RT/RD signs/symptoms  F/U 6 months   2. Diabetes mellitus, type 2 without retinopathy - The incidence, risk factors for progression, natural history and treatment options for diabetic retinopathy  were discussed with patient.   - The need for close monitoring of blood glucose, blood pressure, and serum lipids, avoiding cigarette or any type of tobacco, and the need for long term follow up was also discussed with patient. - f/u in 1 year, sooner prn  3. Epiretinal membrane, OD - The natural history, anatomy, potential for loss of vision, and treatment options including vitrectomy techniques and the complications of endophthalmitis, retinal detachment, vitreous hemorrhage, cataract progression and permanent vision loss discussed with the patient. - Interval development of ERM  and blunting of foveal contour - asymptomatic, no metamorphopsia - no indication for surgery at this time - monitor for now - f/u 6 mos  4,5. Hypertensive retinopathy OU - discussed importance of tight BP control - monitor  6. No retinal edema on exam or OCT  7,8. High myopia OU w/ history RK with multiple enhancements/adjustments OU - some chronic corneal scarring and haze present OU - stable from prior  9. Combined form age-related cataract OU-  - The symptoms of cataract, surgical options, and treatments and risks were discussed with patient. - discussed diagnosis and progression - pt under the expert management of Dr. Trinna Post at [facility] Ordered this visit:  No orders of the defined types were placed in this encounter.      Return in about 6 months (around 10/06/2018) for F/U, PVD OU, ERM OD.  There are no Patient Instructions on file for this visit.   Explained the diagnoses, plan, and follow up with the patient and they expressed understanding.  Patient expressed understanding of the importance of proper follow up care.   This document serves as a record of services personally performed by Gardiner Sleeper, MD, PhD. It was created on their behalf by [name] Mallick, OA, an ophthalmic assistant. The creation of this record is the provider's dictation  and/or activities during the visit.    Electronically signed by: [name] Mallick, OA  11.18.19 2:38 PM    Gardiner Sleeper, M.D., Ph.D. Diseases & Surgery of the Retina and Vitreous [facility]   I have reviewed the above documentation for accuracy and completeness, and I agree with the above. Gardiner Sleeper, M.D., Ph.D. 04/09/18 2:42 PM     Abbreviations: M myopia (nearsighted); A astigmatism; H hyperopia (farsighted); P presbyopia; Mrx spectacle prescription;  CTL contact lenses; OD right eye; OS left eye; OU both eyes  XT exotropia; ET esotropia; PEK punctate epithelial  keratitis; PEE punctate epithelial erosions; DES dry eye syndrome; MGD meibomian gland dysfunction; ATs artificial tears; PFAT's preservative free artificial tears; [place] nuclear sclerotic cataract; PSC posterior subcapsular cataract; ERM epi-retinal membrane; PVD posterior vitreous detachment; RD retinal detachment; DM diabetes mellitus; DR diabetic retinopathy; NPDR non-proliferative diabetic retinopathy; PDR proliferative diabetic retinopathy; CSME clinically significant macular edema; DME diabetic macular edema; dbh dot blot hemorrhages; CWS cotton wool spot; POAG primary open angle glaucoma; C/D cup-to-disc ratio; HVF humphrey visual field; GVF goldmann visual field; OCT optical coherence tomography; IOP intraocular pressure; BRVO Branch retinal vein occlusion; CRVO central retinal vein occlusion; CRAO central retinal artery occlusion; BRAO branch retinal artery occlusion; RT retinal tear; SB scleral buckle; PPV pars plana vitrectomy; VH Vitreous hemorrhage; PRP panretinal laser photocoagulation; IVK intravitreal kenalog; VMT vitreomacular traction; MH Macular hole;  NVD neovascularization of the disc; NVE neovascularization elsewhere; AREDS age related eye disease study; ARMD age related macular degeneration; POAG primary open angle glaucoma; EBMD epithelial/anterior basement membrane dystrophy; ACIOL anterior chamber intraocular lens; IOL intraocular lens; PCIOL posterior chamber intraocular lens; Phaco/IOL phacoemulsification with intraocular lens placement; [place] photorefractive keratectomy; LASIK laser assisted in situ keratomileusis; HTN hypertension; DM diabetes mellitus; COPD chronic obstructive pulmonary disease

## 2018-04-07 ENCOUNTER — Ambulatory Visit (INDEPENDENT_AMBULATORY_CARE_PROVIDER_SITE_OTHER): Payer: 59 | Admitting: Ophthalmology

## 2018-04-07 ENCOUNTER — Telehealth: Payer: Self-pay | Admitting: Family Medicine

## 2018-04-07 ENCOUNTER — Encounter (INDEPENDENT_AMBULATORY_CARE_PROVIDER_SITE_OTHER): Payer: Self-pay | Admitting: Ophthalmology

## 2018-04-07 ENCOUNTER — Other Ambulatory Visit: Payer: Self-pay

## 2018-04-07 ENCOUNTER — Ambulatory Visit: Payer: Self-pay

## 2018-04-07 ENCOUNTER — Encounter: Payer: Self-pay | Admitting: Family Medicine

## 2018-04-07 DIAGNOSIS — H3581 Retinal edema: Secondary | ICD-10-CM | POA: Diagnosis not present

## 2018-04-07 DIAGNOSIS — H35371 Puckering of macula, right eye: Secondary | ICD-10-CM

## 2018-04-07 DIAGNOSIS — H5213 Myopia, bilateral: Secondary | ICD-10-CM | POA: Diagnosis not present

## 2018-04-07 DIAGNOSIS — H43813 Vitreous degeneration, bilateral: Secondary | ICD-10-CM | POA: Diagnosis not present

## 2018-04-07 DIAGNOSIS — H25813 Combined forms of age-related cataract, bilateral: Secondary | ICD-10-CM

## 2018-04-07 DIAGNOSIS — Z9889 Other specified postprocedural states: Secondary | ICD-10-CM

## 2018-04-07 DIAGNOSIS — H35033 Hypertensive retinopathy, bilateral: Secondary | ICD-10-CM | POA: Diagnosis not present

## 2018-04-07 DIAGNOSIS — I10 Essential (primary) hypertension: Secondary | ICD-10-CM

## 2018-04-07 DIAGNOSIS — E119 Type 2 diabetes mellitus without complications: Secondary | ICD-10-CM

## 2018-04-07 MED ORDER — AMOXICILLIN-POT CLAVULANATE 875-125 MG PO TABS: 1.0000 | ORAL_TABLET | Freq: Two times a day (BID) | ORAL | 0 refills | Status: DC

## 2018-04-07 MED ORDER — CIPROFLOXACIN HCL 500 MG PO TABS: 500.0000 mg | ORAL_TABLET | Freq: Two times a day (BID) | ORAL | 0 refills | Status: DC

## 2018-04-07 MED FILL — CIPROFLOXACIN HCL 500 MG TA: 500 | 7 days supply | Qty: 14 | Fill #0

## 2018-04-07 MED FILL — AMOX-CLAV 875-125 MG TABLET: 875-125 | 10 days supply | Qty: 20 | Fill #0

## 2018-04-07 NOTE — Telephone Encounter (Signed)
Left message on voicemail to call office.  

## 2018-04-07 NOTE — Telephone Encounter (Signed)
Please see message. °

## 2018-04-07 NOTE — Telephone Encounter (Signed)
Called in patient informed.  

## 2018-04-07 NOTE — Telephone Encounter (Signed)
Medication sent.

## 2018-04-07 NOTE — Addendum Note (Signed)
Addended by: Briscoe Deutscher R on: 04/07/2018 02:19 PM   Modules accepted: Orders

## 2018-04-07 NOTE — Telephone Encounter (Signed)
Copied from [place] [id number]. Topic: General - Other >> Apr 07, 2018  2:57 PM Alexandra Walker wrote: Reason for CRM: Pt called in, she says we sent the medication amoxicillin-clavulanate (AUGMENTIN) 875-125 MG tablet to the wrong pharmacy, and she would like for [name] to transfer the medication over to the preferred pharmacy on file.   [place], [place] - [place]  [phone] (Phone) [phone] (Fax)

## 2018-04-07 NOTE — Telephone Encounter (Signed)
I went ahead and sent in Augmentin. Another would be CTX if she wants [name] to do that instead.

## 2018-04-07 NOTE — Telephone Encounter (Signed)
Pt called stating that she does not want to take Cipro because of the risk of tendon rupture. She is requesting another antibiotic. She states her symptoms have improved but understands that with E coli she need to take something  Reason for Disposition . Caller has NON-URGENT medication question about med that PCP prescribed and triager unable to answer question  Answer Assessment - Initial Assessment Questions 1. SYMPTOMS: "Do you have any symptoms?"     Has e coli in urine 2. SEVERITY: If symptoms are present, ask "Are they mild, moderate or severe?"     mild  Protocols used: MEDICATION QUESTION CALL-A-AH

## 2018-04-08 NOTE — Telephone Encounter (Signed)
Spoke to pt told her Dr. Juleen China sent Rx for Augmentin to the pharmacy for her. Pt verbalized understanding and said she did get was at the pharmacy when sent in. Pt also said she is feeling better fever is gone. Told her okay, if symptoms persist after completing antibiotic needs to be seen. Pt verbalized understanding.

## 2018-04-09 ENCOUNTER — Encounter (INDEPENDENT_AMBULATORY_CARE_PROVIDER_SITE_OTHER): Payer: Self-pay | Admitting: Ophthalmology

## 2018-04-16 ENCOUNTER — Encounter: Payer: Self-pay | Admitting: Family Medicine

## 2018-04-19 ENCOUNTER — Emergency Department (INDEPENDENT_AMBULATORY_CARE_PROVIDER_SITE_OTHER)
Admission: EM | Admit: 2018-04-19 | Discharge: 2018-04-19 | Disposition: A | Discharge status: Home / Self Care | Payer: 59 | Source: Home / Self Care | Attending: Family Medicine | Admitting: Family Medicine

## 2018-04-19 ENCOUNTER — Encounter: Payer: Self-pay | Admitting: Emergency Medicine

## 2018-04-19 DIAGNOSIS — R509 Fever, unspecified: Secondary | ICD-10-CM | POA: Diagnosis not present

## 2018-04-19 DIAGNOSIS — N3000 Acute cystitis without hematuria: Secondary | ICD-10-CM

## 2018-04-19 LAB — POCT URINALYSIS DIP (MANUAL ENTRY)
Bilirubin, UA: NEGATIVE
Blood, UA: NEGATIVE
Glucose, UA: NEGATIVE mg/dL
Ketones, POC UA: NEGATIVE mg/dL
Nitrite, UA: NEGATIVE
Spec Grav, UA: 1.02 (ref 1.010–1.025)
Urobilinogen, UA: 0.2 E.U./dL
pH, UA: 6.5 (ref 5.0–8.0)

## 2018-04-19 MED ORDER — CEPHALEXIN 500 MG PO CAPS: 500.0000 mg | ORAL_CAPSULE | Freq: Two times a day (BID) | ORAL | 0 refills | Status: DC

## 2018-04-19 NOTE — Discharge Instructions (Signed)
°  Please take your antibiotic as prescribed. A urine culture has been sent to check the severity of your urinary infection and to determine if you are on the most appropriate antibiotic. The results should come back within 2-3 days and you will be notified even if no medication change is needed.  Please stay well hydrated and follow up with your family doctor in 1 week if not improving, sooner if worsening.  

## 2018-04-19 NOTE — ED Triage Notes (Signed)
Patient c/o pylo, back pain, fever x 1 day.

## 2018-04-19 NOTE — ED Provider Notes (Signed)
[facility]    CSN: [id number] Arrival date & time: 04/19/18  1319     History   Chief Complaint Chief Complaint  Patient presents with  . Urinary Tract Infection    HPI Alexandra Walker is a 62 y.o. female.   HPI  Alexandra Walker is a 62 y.o. female presenting to UC with hx of recurrent UTIs including pyelonephritis. She was admitted with sepsis in 2017 due to pyelonephritis.  She was dx with a UTI about 2 weeks ago with symptoms of back pain, fever, and chills. She was started on bactrim but was switched 4 days into her treatment to Augmentin.  She was prescribed 10 days but felt better after 8 days so she stopped taking her antibiotic about 1 week ago.  She did take 1 tab last night due to recurrence of back pain and chills.  Denies n/v/d.  Denies URI symptoms to explain her symptoms and she tested negative for the flu 2 weeks ago.     Past Medical History:  Diagnosis Date  . 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HCC)   . Cervical intraepithelial neoplasia grade 1 03/17/2017  . Chronic ankle edema   . Chronic depressive disorder   . Circadian rhythm sleep disorder, shift work   . Diabetes (HCC)   . Female bladder prolapse 03/17/2017  . GERD (gastroesophageal reflux disease)   . Glucose intolerance (impaired glucose tolerance)   . Hepatic steatosis   . HLA B27 (HLA B27 positive)   . HLA-B27 positive 03/29/2018  . Hx of pancreatitis   . Hyperlipidemia   . Hyperopia with astigmatism and presbyopia, bilateral 10/27/2017  . Hypertension   . IBS (irritable bowel syndrome)   . Ischemic colitis (HCC)   . Keratoconjunctivitis sicca of both eyes not specified as Sjogren's 10/27/2017  . Kyphosis   . Lateral epicondylitis of right elbow   . LGSIL (low grade squamous intraepithelial dysplasia) 08/05/2015  . Migraine headache   . Migraine headache without aura 05/10/2015  . Osteoarthritis   . PAC (premature atrial contraction)   . Paronychia   . Paroxysmal atrial fibrillation  (HCC)   . PMS (premenstrual syndrome)   . PONV (postoperative nausea and vomiting)   . Pyelonephritis 01/2016  . Sacroiliitis (HCC)   . Seasonal allergies   . Sepsis (HCC) 01/2016  . SVT (supraventricular tachycardia) (HCC)    a. 01/2016 - SVT, wide complex tachycardia, and brief afib in setting of severe sepsis.  . Trigger ring finger of left hand   . Vitamin D deficiency     Patient Active Problem List   Diagnosis Date Noted  . Drug-induced pancreatitis, 2015, thought to be due to VIctoza 03/29/2018  . History of hysterectomy in 2017 03/29/2018  . Left kidney, 12 mm nonobstructing calculus, on US 2017  03/29/2018  . History of vitreous detachment OU, HTN retinopathy OU, High myopia OU, Cataracts OU, Vitreous floaters OU, Keratoconjunctivitis sicca OU not specified as Sjogren's 03/29/2018  . History of cholecystectomy 03/29/2018  . 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HCC)   . Attention deficit hyperactivity disorder (ADHD), combined type, on Vyvanse 10/19/2017  . Recurrent circadian rhythm sleep disorder, shift work type 03/31/2017  . Morbid obesity (HCC) 03/31/2017  . IBS (irritable bowel syndrome), diarrhea predominant 03/17/2017  . Nonalcoholic fatty liver disease, followed by [facility] 09/26/2016  . OSA on CPAP, followed by Neurology/Sleep 09/26/2016  . Afib and SVT during an episode of sepsis secondary to pyelonephritis in October 2017 requiring hospitalization 02/01/2016  .   Hypertension associated with diabetes ([place]), on Losartan and Metoprolol 01/31/2016  . Type 2 diabetes mellitus without complication, without long-term current use of insulin ([place]), on Metformin 01/31/2016  . Hyperlipidemia associated with type 2 diabetes mellitus ([place]), on Zocor 05/10/2015  . Vitamin D deficiency, on Vit D 2000 IU daily 05/10/2015  . PAC (premature atrial contraction), controlled with Metoprolol 50 mg daily - failed 25 mg daily 09/19/2011    Past Surgical History:  Procedure Laterality Date   . ABDOMINAL HYSTERECTOMY    . BLADDER SURGERY    . BREAST BIOPSY Left   . CERVICAL CONE BIOPSY    . CHOLECYSTECTOMY N/A 10/26/2014   Procedure: LAPAROSCOPIC CHOLECYSTECTOMY;  Surgeon: Coralie Keens, MD;  Location: [place];  Service: General;  Laterality: N/A;  . COLONOSCOPY W/ BIOPSIES  multiple  . DIAGNOSTIC LAPAROSCOPY    . DILATION AND CURETTAGE OF UTERUS  07,11  . ESOPHAGOGASTRODUODENOSCOPY  multiple  . EYE SURGERY    . KNEE ARTHROSCOPY Left 11/2008  . LAPAROSCOPIC VAGINAL HYSTERECTOMY WITH SALPINGO OOPHORECTOMY Bilateral 10/03/2015   Procedure: LAPAROSCOPIC ASSISTED VAGINAL HYSTERECTOMY WITH SALPINGECTOMY;  Surgeon: Eldred Manges, MD;  Location: [facility];  Service: Gynecology;  Laterality: Bilateral;  . LASIK  1990   RK  . LEFT FINGER SURGERY    . MASS EXCISION Left 12/24/2012   Procedure: LEFT EXCISION OF PALMAR MASS X TWO;  Surgeon: Roseanne Kaufman, MD;  Location: [facility];  Service: Orthopedics;  Laterality: Left;    OB History    Gravida  1   Para      Term      Preterm      AB  1   Living        SAB  1   TAB      Ectopic      Multiple      Live Births               Home Medications    Prior to Admission medications   Medication Sig Start Date End Date Taking? Authorizing Provider  ALPRAZolam Duanne Moron) 0.5 MG tablet Take 1 tablet (0.5 mg total) by mouth at bedtime as needed for anxiety. 03/30/18   Briscoe Deutscher, DO  aspirin 325 MG tablet Take 325 mg by mouth daily.    [provider]  Blood Glucose Monitoring Suppl (FREESTYLE LITE) DEVI Check blood sugar 2-3 times per day.  Dx:E11.21 10/21/16   Shelda Pal, DO  buPROPion (WELLBUTRIN XL) 150 MG 24 hr tablet Take 1 tablet (150 mg total) by mouth daily. 03/30/18   Briscoe Deutscher, DO  cephALEXin (KEFLEX) 500 MG capsule Take 1 capsule (500 mg total) by mouth 2 (two) times daily. 04/19/18   Noe Gens, PA-C  cholecalciferol (VITAMIN D) 1000 UNITS  tablet Take 1,000 Units by mouth daily.    [provider]  cyclobenzaprine (FLEXERIL) 5 MG tablet Take 1 tablet (5 mg total) by mouth at bedtime as needed for muscle spasms. 07/25/14   Hudnall, Sharyn Lull, MD  diclofenac sodium (VOLTAREN) 1 % GEL APPLY 2 GRAMS TOPICALLY 2 TIMES DAILY AS NEEDED. NOTE: 2 GRAMS IS EQUIVALENT TO A NICKEL SIZED AMOUNT. 05/30/16   [provider]  glucose blood (FREESTYLE LITE) test strip Check blood sugar 2-3 times per day.  Dx:E11.21. 10/21/16   Shelda Pal, DO  ibuprofen (ADVIL,MOTRIN) 200 MG tablet Take 200 mg by mouth every 6 (six) hours as needed.    [provider]  Lancets (FREESTYLE) lancets Check blood sugar 2-3 times per day.  Dx:E11.21 10/21/16   Wendling, Nicholas Paul, DO  losartan (COZAAR) 50 MG tablet TAKE 1 TABLET (50 MG TOTAL) BY MOUTH DAILY. 02/10/18   Wallace, Erica, DO  metFORMIN (GLUCOPHAGE-XR) 750 MG 24 hr tablet TAKE TWO TABLETS BY MOUTH DAILY BEFORE BREAKFAST 02/10/18   Wallace, Erica, DO  metoprolol succinate (TOPROL-XL) 50 MG 24 hr tablet Take 1 tablet (50 mg total) by mouth daily. Take with or immediately following a meal. 03/31/18   Wallace, Erica, DO  Misc. Devices (SCD SOFT SLEEVES/KNEE LENGTH) MISC To be used at home. 06/17/17   Wallace, Erica, DO  Omega-3 1000 MG CAPS Take 1 capsule by mouth 4 (four) times daily.    [provider]  saccharomyces boulardii (FLORASTOR) 250 MG capsule Take 1 capsule (250 mg total) by mouth 2 (two) times daily. 02/02/16   Regalado, Belkys A, MD  simvastatin (ZOCOR) 40 MG tablet TAKE 1 TABLET (40 MG TOTAL) BY MOUTH EVERY EVENING. 02/10/18   Wallace, Erica, DO  Turmeric 450 MG CAPS Take 900 mg by mouth daily.    [provider]    Family History Family History  Problem Relation Age of Onset  . Heart attack Father   . Heart disease Father   . Coronary artery disease Mother   . Hypertension Mother   . Heart disease Mother   . Heart attack Mother   . Diabetes  Mother   . Hyperlipidemia Mother   . Kidney disease Mother   . Depression Mother   . Sleep apnea Mother   . Obesity Mother   . Hypertension Sister   . Hypertension Brother   . Colon cancer Neg Hx     Social History Social History   Tobacco Use  . Smoking status: Never Smoker  . Smokeless tobacco: Never Used  Substance Use Topics  . Alcohol use: Yes    Alcohol/week: 0.0 standard drinks    Comment: social  . Drug use: No     Allergies   Ace inhibitors; Oxycodone-acetaminophen; Vicodin [hydrocodone-acetaminophen]; and Victoza [liraglutide]   Review of Systems Review of Systems  Constitutional: Positive for chills and fever.  HENT: Negative for congestion.   Respiratory: Negative for cough and chest tightness.   Genitourinary: Negative for dysuria, flank pain, frequency, hematuria and urgency.  Musculoskeletal: Positive for back pain and myalgias.  Neurological: Negative for dizziness, light-headedness and headaches.     Physical Exam Triage Vital Signs ED Triage Vitals  Enc Vitals Group     BP 04/19/18 1408 118/74     Pulse Rate 04/19/18 1408 88     Resp --      Temp 04/19/18 1408 99.3 F (37.4 C)     Temp Source 04/19/18 1408 Oral     SpO2 04/19/18 1408 97 %     Weight 04/19/18 1409 234 lb (106.1 kg)     Height 04/19/18 1409 5' 6" (1.676 m)     Head Circumference --      Peak Flow --      Pain Score 04/19/18 1409 0     Pain Loc --      Pain Edu? --      Excl. in GC? --    No data found.  Updated Vital Signs BP 118/74 (BP Location: Right Arm)   Pulse 88   Temp 99.3 F (37.4 C) (Oral)   Ht 5' 6" (1.676 m)   Wt 234 lb (106.1   kg)   LMP 05/20/2008   SpO2 97%   BMI 37.77 kg/m   Visual Acuity Right Eye Distance:   Left Eye Distance:   Bilateral Distance:    Right Eye Near:   Left Eye Near:    Bilateral Near:     Physical Exam  Constitutional: She is oriented to person, place, and time. She appears well-developed and well-nourished. No  distress.  HENT:  Head: Normocephalic and atraumatic.  Eyes: EOM are normal.  Neck: Normal range of motion.  Cardiovascular: Normal rate and regular rhythm.  Pulmonary/Chest: Effort normal and breath sounds normal. No stridor. No respiratory distress. She has no wheezes. She has no rales.  Abdominal: Soft. She exhibits no distension. There is no tenderness. There is CVA tenderness (bilateral, mild).  Musculoskeletal: Normal range of motion.  Neurological: She is alert and oriented to person, place, and time.  Skin: Skin is warm and dry. She is not diaphoretic.  Psychiatric: She has a normal mood and affect. Her behavior is normal.  Nursing note and vitals reviewed.    UC Treatments / Results  Labs (all labs ordered are listed, but only abnormal results are displayed) Labs Reviewed  POCT URINALYSIS DIP (MANUAL ENTRY) - Abnormal; Notable for the following components:      Result Value   Protein Ur, POC trace (*)    Leukocytes, UA Small (1+) (*)    All other components within normal limits  URINE CULTURE    EKG None  Radiology No results found.  Procedures Procedures (including critical care time)  Medications Ordered in UC Medications - No data to display  Initial Impression / Assessment and Plan / UC Course  I have reviewed the triage vital signs and the nursing notes.  Pertinent labs & imaging results that were available during my care of the patient were reviewed by me and considered in my medical decision making (see chart for details).     Low grade temp: 99.3*F  Small amount of leukocytes. Due to pt's hx, will start pt on Keflex while culture pending. Pt wants to hold off on cipro unless absolutely necessary due fear of tendon rupture side effects. Pt already reports achilles tendonitis.  Final Clinical Impressions(s) / UC Diagnoses   Final diagnoses:  Fever, unspecified fever cause  Acute cystitis without hematuria     Discharge Instructions       Please take your antibiotic as prescribed. A urine culture has been sent to check the severity of your urinary infection and to determine if you are on the most appropriate antibiotic. The results should come back within 2-3 days and you will be notified even if no medication change is needed.  Please stay well hydrated and follow up with your family doctor in 1 week if not improving, sooner if worsening.     ED Prescriptions    Medication Sig Dispense Auth. Provider   cephALEXin (KEFLEX) 500 MG capsule Take 1 capsule (500 mg total) by mouth 2 (two) times daily. 14 capsule Noe Gens, PA-C     Controlled Substance Prescriptions [place] Controlled Substance Registry consulted? Not Applicable   Tyrell Antonio 04/19/18 1503

## 2018-04-20 ENCOUNTER — Encounter (HOSPITAL_COMMUNITY): Payer: Self-pay

## 2018-04-20 ENCOUNTER — Other Ambulatory Visit (HOSPITAL_COMMUNITY)
Admission: RE | Admit: 2018-04-20 | Discharge: 2018-04-20 | Disposition: A | Discharge status: Home / Self Care | Payer: 59 | Source: Other Acute Inpatient Hospital | Attending: Family Medicine | Admitting: Family Medicine

## 2018-04-20 ENCOUNTER — Ambulatory Visit (INDEPENDENT_AMBULATORY_CARE_PROVIDER_SITE_OTHER): Payer: 59 | Admitting: Family Medicine

## 2018-04-20 ENCOUNTER — Ambulatory Visit (HOSPITAL_COMMUNITY)
Admission: RE | Admit: 2018-04-20 | Discharge: 2018-04-20 | Disposition: A | Discharge status: Home / Self Care | Payer: 59 | Source: Ambulatory Visit | Attending: Family Medicine | Admitting: Family Medicine

## 2018-04-20 ENCOUNTER — Encounter: Payer: Self-pay | Admitting: Family Medicine

## 2018-04-20 VITALS — BP 140/86 | HR 90 | Temp 100.5°F | Ht 66.0 in | Wt 234.0 lb

## 2018-04-20 DIAGNOSIS — R509 Fever, unspecified: Secondary | ICD-10-CM | POA: Insufficient documentation

## 2018-04-20 DIAGNOSIS — I152 Hypertension secondary to endocrine disorders: Secondary | ICD-10-CM

## 2018-04-20 DIAGNOSIS — E1159 Type 2 diabetes mellitus with other circulatory complications: Secondary | ICD-10-CM | POA: Diagnosis not present

## 2018-04-20 DIAGNOSIS — I10 Essential (primary) hypertension: Secondary | ICD-10-CM

## 2018-04-20 DIAGNOSIS — N119 Chronic tubulo-interstitial nephritis, unspecified: Secondary | ICD-10-CM | POA: Diagnosis not present

## 2018-04-20 DIAGNOSIS — N200 Calculus of kidney: Secondary | ICD-10-CM | POA: Diagnosis not present

## 2018-04-20 LAB — COMPREHENSIVE METABOLIC PANEL
ALT: 15 U/L (ref 0–44)
AST: 15 U/L (ref 15–41)
Albumin: 4 g/dL (ref 3.5–5.0)
Alkaline Phosphatase: 73 U/L (ref 38–126)
Anion gap: 10 (ref 5–15)
BUN: 15 mg/dL (ref 6–20)
CO2: 27 mmol/L (ref 22–32)
Calcium: 9.2 mg/dL (ref 8.9–10.3)
Chloride: 101 mmol/L (ref 98–111)
Creatinine, Ser: 0.74 mg/dL (ref 0.44–1.00)
GFR calc Af Amer: >60 mL/min (ref >60)
GFR calc non Af Amer: >60 mL/min (ref >60)
Glucose, Bld: 108 mg/dL — ABNORMAL HIGH (ref 70–99)
Potassium: 3.9 mmol/L (ref 3.5–5.1)
Sodium: 138 mmol/L (ref 135–145)
Total Bilirubin: 0.5 mg/dL (ref 0.3–1.2)
Total Protein: 7.6 g/dL (ref 6.5–8.1)

## 2018-04-20 LAB — CBC WITH DIFFERENTIAL/PLATELET
Abs Immature Granulocytes: 0.03 10*3/uL (ref 0.00–0.07)
Basophils Absolute: 0.1 10*3/uL (ref 0.0–0.1)
Basophils Relative: 1 %
Eosinophils Absolute: 0.1 10*3/uL (ref 0.0–0.5)
Eosinophils Relative: 1 %
HCT: 43.3 % (ref 36.0–46.0)
Hemoglobin: 13.1 g/dL (ref 12.0–15.0)
Immature Granulocytes: 0 %
Lymphocytes Relative: 20 %
Lymphs Abs: 1.9 10*3/uL (ref 0.7–4.0)
MCH: 27.2 pg (ref 26.0–34.0)
MCHC: 30.3 g/dL (ref 30.0–36.0)
MCV: 90 fL (ref 80.0–100.0)
Monocytes Absolute: 1.1 10*3/uL — ABNORMAL HIGH (ref 0.1–1.0)
Monocytes Relative: 12 %
Neutro Abs: 6.4 10*3/uL (ref 1.7–7.7)
Neutrophils Relative %: 66 %
Platelets: 327 10*3/uL (ref 150–400)
RBC: 4.81 MIL/uL (ref 3.87–5.11)
RDW: 12.5 % (ref 11.5–15.5)
WBC: 9.5 10*3/uL (ref 4.0–10.5)
nRBC: 0 % (ref 0.0–0.2)

## 2018-04-20 LAB — POC INFLUENZA A&B (BINAX/QUICKVUE)
Influenza A, POC: NEGATIVE
Influenza B, POC: NEGATIVE

## 2018-04-20 LAB — URINE CULTURE
MICRO NUMBER:: 91438630
SPECIMEN QUALITY:: ADEQUATE

## 2018-04-20 MED ORDER — IOHEXOL 300 MG/ML  SOLN
30.0000 mL | Freq: Once | INTRAMUSCULAR | Status: AC | PRN
  Administered 2018-04-20: 30 mL via ORAL

## 2018-04-20 MED ORDER — CEFTRIAXONE SODIUM 1 G IJ SOLR
1.0000 g | Freq: Once | INTRAMUSCULAR | Status: AC
  Administered 2018-04-20: 1 g via INTRAMUSCULAR

## 2018-04-20 MED ORDER — METOPROLOL SUCCINATE ER 25 MG PO TB24: 25.0000 mg | ORAL_TABLET | Freq: Every day | ORAL | 3 refills | Status: DC

## 2018-04-20 MED FILL — CEPHALEXIN 500 MG CAPSULE: 500 | 7 days supply | Qty: 14 | Fill #0

## 2018-04-20 NOTE — Progress Notes (Signed)
Alexandra Walker is a 62 y.o. female here for an acute visit.  History of Present Illness:   Alexandra Walker, CMA acting as scribe for Dr. Briscoe Deutscher.   HPI: Patient in office for evaluation. Patient started with fever 3 days ago. She has been taking Ibuprofen for fever. She last had at midnight. She has had low back pain with off and on right upper quadrant and right lower quadrant pain. She did have urine checked and set for culture. She would like flu test today.   PMHx, SurgHx, SocialHx, Medications, and Allergies were reviewed in the Visit Navigator and updated as appropriate.  Current Medications:   .  ALPRAZolam (XANAX) 0.5 MG tablet, Take 1 tablet (0.5 mg total) by mouth at bedtime as needed for anxiety., Disp: 20 tablet, Rfl: 0 .  aspirin 325 MG tablet, Take 325 mg by mouth daily., Disp: , Rfl:  .  buPROPion (WELLBUTRIN XL) 150 MG 24 hr tablet, Take 1 tablet (150 mg total) by mouth daily., Disp: 90 tablet, Rfl: 3 .  cephALEXin (KEFLEX) 500 MG capsule, Take 1 capsule (500 mg total) by mouth 2 (two) times daily., Disp: 14 capsule, Rfl: 0 .  cholecalciferol (VITAMIN D) 1000 UNITS tablet, Take 1,000 Units by mouth daily., Disp: , Rfl:  .  cyclobenzaprine (FLEXERIL) 5 MG tablet, Take 1 tablet (5 mg total) by mouth at bedtime as needed for muscle spasms., Disp: 30 tablet, Rfl: 2 .  diclofenac sodium (VOLTAREN) 1 % GEL, APPLY 2 GRAMS TOPICALLY 2 TIMES DAILY AS NEEDED. NOTE: 2 GRAMS IS EQUIVALENT TO A NICKEL SIZED AMOUNT., Disp: , Rfl:  .  ibuprofen (ADVIL,MOTRIN) 200 MG tablet, Take 200 mg by mouth every 6 (six) hours as needed., Disp: , Rfl:  .  losartan (COZAAR) 50 MG tablet, TAKE 1 TABLET (50 MG TOTAL) BY MOUTH DAILY., Disp: 30 tablet, Rfl: 5 .  metFORMIN (GLUCOPHAGE-XR) 750 MG 24 hr tablet, TAKE TWO TABLETS BY MOUTH DAILY BEFORE BREAKFAST, Disp: 180 tablet, Rfl: 2 .  metoprolol succinate (TOPROL-XL) 50 MG 24 hr tablet, Take 1 tablet (50 mg total) by mouth daily. Take with or  immediately following a meal., Disp: 90 tablet, Rfl: 2 .  Misc. Devices (SCD SOFT SLEEVES/KNEE LENGTH) MISC, To be used at home., Disp: 2 each, Rfl: 1 .  Omega-3 1000 MG CAPS, Take 1 capsule by mouth 4 (four) times daily., Disp: , Rfl:  .  saccharomyces boulardii (FLORASTOR) 250 MG capsule, Take 1 capsule (250 mg total) by mouth 2 (two) times daily., Disp: 60 capsule, Rfl: 0 .  simvastatin (ZOCOR) 40 MG tablet, TAKE 1 TABLET (40 MG TOTAL) BY MOUTH EVERY EVENING., Disp: 30 tablet, Rfl: 5 .  Turmeric 450 MG CAPS, Take 900 mg by mouth daily., Disp: , Rfl:    Allergies  Allergen Reactions  . Ace Inhibitors Other (See Comments)    coughing  . Oxycodone-Acetaminophen Nausea And Vomiting  . Vicodin [Hydrocodone-Acetaminophen] Nausea And Vomiting  . Victoza [Liraglutide] Other (See Comments)    pancreatitis   Review of Systems:   Pertinent items are noted in the HPI. Otherwise, ROS is negative.  Vitals:   Vitals:   04/20/18 1042  BP: 140/86  Pulse: 90  Temp: (!) 100.5 F (38.1 C)  TempSrc: Oral  SpO2: 97%  Weight: 234 lb (106.1 kg)  Height: 5\' 6"  (1.676 m)     Body mass index is 37.77 kg/m.  Physical Exam:   Physical Exam  Constitutional: She appears well-nourished.  HENT:  Head: Normocephalic and atraumatic.  Eyes: Pupils are equal, round, and reactive to light. EOM are normal.  Neck: Normal range of motion. Neck supple.  Cardiovascular: Normal rate, regular rhythm, normal heart sounds and intact distal pulses.  Pulmonary/Chest: Effort normal.  Abdominal: Soft.  Skin: Skin is warm.  Psychiatric: She has a normal mood and affect. Her behavior is normal.  Nursing note and vitals reviewed.    Results for orders placed or performed in visit on 04/20/18  POC Influenza A&B(BINAX/QUICKVUE)  Result Value Ref Range   Influenza A, POC Negative Negative   Influenza B, POC Negative Negative   Assessment and Plan:   Alexandra Walker was seen today for fever.  Diagnoses and all orders  for this visit:  Fever, unspecified fever cause Comments: Concern for pyelonephritis, with history of sepsis. Orders: -     POC Influenza A&B(BINAX/QUICKVUE) -     CT Abdomen Pelvis Wo Contrast -     cefTRIAXone (ROCEPHIN) injection 1 g -     Blood culture (routine single); Future -     Blood culture (routine single); Future -     CBC with Differential/Platelet; Future -     Comprehensive metabolic panel; Future  Pyelonephritis, chronic Comments: Patient with similar symptoms over the past months. A few different positive UCx and finished treatments. Still with LE+. Newest UCx pending from yesterday. Orders: -     CT Abdomen Pelvis Wo Contrast -     cefTRIAXone (ROCEPHIN) injection 1 g -     Blood culture (routine single); Future -     Blood culture (routine single); Future -     CBC with Differential/Platelet; Future -     Comprehensive metabolic panel; Future  Hypertension associated with diabetes ([place]), on Losartan and Metoprolol Comments: Stopped Vyvanse due to palpiations. Wants to go back to prior Toprol dose.  Orders: -     metoprolol succinate (TOPROL XL) 25 MG 24 hr tablet; Take 1 tablet (25 mg total) by mouth daily.  . Reviewed expectations re: course of current medical issues. . Discussed self-management of symptoms. . Outlined signs and symptoms indicating need for more acute intervention. . Patient verbalized understanding and all questions were answered. Marland Kitchen Health Maintenance issues including appropriate healthy diet, exercise, and smoking avoidance were discussed with patient. . See orders for this visit as documented in the electronic medical record. . Patient received an After Visit Summary.  CMA served as Education administrator during this visit. History, Physical, and Plan performed by medical provider. The above documentation has been reviewed and is accurate and complete. Briscoe Deutscher, D.O.  Briscoe Deutscher, DO [place] 04/21/2018

## 2018-04-21 ENCOUNTER — Encounter: Payer: Self-pay | Admitting: Family Medicine

## 2018-04-21 ENCOUNTER — Telehealth: Payer: Self-pay | Admitting: *Deleted

## 2018-04-21 NOTE — Telephone Encounter (Signed)
Callback: LMOM following up from visit. I see you have seen your result and been in touch with PCP. Please call back as needed.

## 2018-04-22 ENCOUNTER — Other Ambulatory Visit: Payer: Self-pay

## 2018-04-22 MED ORDER — CEPHALEXIN 500 MG PO CAPS: 500.0000 mg | ORAL_CAPSULE | Freq: Three times a day (TID) | ORAL | 0 refills | Status: DC

## 2018-04-22 NOTE — Telephone Encounter (Signed)
Called patient no fever per Dr. Juleen China new script called in for abx to take tid for 5 days. Patient ok with this.

## 2018-04-23 MED FILL — METOPROLOL SUCCINATE ER 25: 25 | 30 days supply | Qty: 30 | Fill #0

## 2018-04-24 ENCOUNTER — Encounter: Payer: Self-pay | Admitting: Family Medicine

## 2018-04-25 LAB — CULTURE, BLOOD (ROUTINE X 2)
Culture: NO GROWTH
Culture: NO GROWTH
Special Requests: ADEQUATE
Special Requests: ADEQUATE

## 2018-04-26 ENCOUNTER — Other Ambulatory Visit: Payer: Self-pay | Admitting: Family Medicine

## 2018-04-26 DIAGNOSIS — N200 Calculus of kidney: Secondary | ICD-10-CM

## 2018-04-26 DIAGNOSIS — N119 Chronic tubulo-interstitial nephritis, unspecified: Secondary | ICD-10-CM

## 2018-04-28 MED FILL — CEPHALEXIN 500 MG CAPSULE: 500 | 5 days supply | Qty: 15 | Fill #0

## 2018-05-04 ENCOUNTER — Encounter: Payer: Self-pay | Admitting: Family Medicine

## 2018-05-15 ENCOUNTER — Encounter: Payer: Self-pay | Admitting: Family Medicine

## 2018-05-16 ENCOUNTER — Encounter: Payer: Self-pay | Admitting: Family Medicine

## 2018-05-16 DIAGNOSIS — K5730 Diverticulosis of large intestine without perforation or abscess without bleeding: Secondary | ICD-10-CM | POA: Insufficient documentation

## 2018-05-21 ENCOUNTER — Other Ambulatory Visit: Payer: Self-pay

## 2018-05-21 ENCOUNTER — Emergency Department (INDEPENDENT_AMBULATORY_CARE_PROVIDER_SITE_OTHER)
Admission: EM | Admit: 2018-05-21 | Discharge: 2018-05-21 | Disposition: A | Discharge status: Home / Self Care | Payer: 59 | Source: Home / Self Care | Attending: Family Medicine | Admitting: Family Medicine

## 2018-05-21 ENCOUNTER — Encounter: Payer: Self-pay | Admitting: *Deleted

## 2018-05-21 DIAGNOSIS — H6692 Otitis media, unspecified, left ear: Secondary | ICD-10-CM

## 2018-05-21 DIAGNOSIS — R109 Unspecified abdominal pain: Secondary | ICD-10-CM

## 2018-05-21 DIAGNOSIS — N3001 Acute cystitis with hematuria: Secondary | ICD-10-CM | POA: Diagnosis not present

## 2018-05-21 DIAGNOSIS — H7292 Unspecified perforation of tympanic membrane, left ear: Secondary | ICD-10-CM

## 2018-05-21 LAB — POCT URINALYSIS DIP (MANUAL ENTRY)
Bilirubin, UA: NEGATIVE
Glucose, UA: NEGATIVE mg/dL
Nitrite, UA: POSITIVE — AB
Protein Ur, POC: NEGATIVE mg/dL
Spec Grav, UA: 1.015 (ref 1.010–1.025)
Urobilinogen, UA: 0.2 E.U./dL
pH, UA: 6 (ref 5.0–8.0)

## 2018-05-21 MED ORDER — CEFDINIR 300 MG PO CAPS: 300.0000 mg | ORAL_CAPSULE | Freq: Two times a day (BID) | ORAL | 0 refills | Status: AC

## 2018-05-21 NOTE — ED Provider Notes (Signed)
Alexandra Walker CARE    CSN: [id number] Arrival date & time: 05/21/18  1758     History   Chief Complaint Chief Complaint  Patient presents with  . Sore Throat  . Nasal Congestion  . Otalgia    HPI Alexandra Walker is a 63 y.o. female.   HPI Alexandra Walker is a 63 y.o. female presenting to UC with c/o 5 days of nasal congestion, mild sore throat, and minimal cough that developed into Left ear pain and drainage today. Fever of 100.7*F today.  Denies n/v/d.  Pt also requesting UA be run due to hx of recurrent kidney infections and being asymptomatic in the past. She has a known stone on her Left side. F/u scheduled with [facility] in a few weeks.    Past Medical History:  Diagnosis Date  . 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 Cervical intraepithelial neoplasia grade 1 03/17/2017  . Chronic ankle edema   . Chronic depressive disorder   . Circadian rhythm sleep disorder, shift work   . Diabetes ([place])   . Female bladder prolapse 03/17/2017  . GERD (gastroesophageal reflux disease)   . Glucose intolerance (impaired glucose tolerance)   . Hepatic steatosis   . HLA B27 (HLA B27 positive)   . HLA-B27 positive 03/29/2018  . Hx of pancreatitis   . Hyperlipidemia   . Hyperopia with astigmatism and presbyopia, bilateral 10/27/2017  . Hypertension   . IBS (irritable bowel syndrome)   . Ischemic colitis ([place])   . Keratoconjunctivitis sicca of both eyes not specified as Sjogren's 10/27/2017  . Kyphosis   . Lateral epicondylitis of right elbow   . LGSIL (low grade squamous intraepithelial dysplasia) 08/05/2015  . Migraine headache   . Migraine headache without aura 05/10/2015  . Osteoarthritis   . PAC (premature atrial contraction)   . Paronychia   . Paroxysmal atrial fibrillation (HCC)   . PMS (premenstrual syndrome)   . PONV (postoperative nausea and vomiting)   . Pyelonephritis 01/2016  . Sacroiliitis ([place])   . Seasonal allergies   . Sepsis ([place]) 01/2016  . SVT  (supraventricular tachycardia) ([place])    a. 01/2016 - SVT, wide complex tachycardia, and brief afib in setting of severe sepsis.  . Trigger ring finger of left hand   . Vitamin D deficiency     Patient Active Problem List   Diagnosis Date Noted  . Diverticulosis of colon 05/16/2018  . Drug-induced pancreatitis, 2015, thought to be due to [facility] 03/29/2018  . History of hysterectomy in 2017 03/29/2018  . Left kidney, 12 mm nonobstructing calculus, on [name] 2017  03/29/2018  . History of vitreous detachment OU, HTN retinopathy OU, High myopia OU, Cataracts OU, Vitreous floaters OU, Keratoconjunctivitis sicca OU not specified as Sjogren's 03/29/2018  . History of cholecystectomy 03/29/2018  . 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 Attention deficit hyperactivity disorder (ADHD), combined type, on Vyvanse 10/19/2017  . Recurrent circadian rhythm sleep disorder, shift work type 03/31/2017  . Morbid obesity ([place]) 03/31/2017  . IBS (irritable bowel syndrome), diarrhea predominant 03/17/2017  . Nonalcoholic fatty liver disease, followed by [facility] 09/26/2016  . OSA on CPAP, followed by Neurology/Sleep 09/26/2016  . Afib and SVT during an episode of sepsis secondary to pyelonephritis in October 2017 requiring hospitalization 02/01/2016  . Hypertension associated with diabetes ([place]), on Losartan and Metoprolol 01/31/2016  . Type 2 diabetes mellitus without complication, without long-term current use of insulin ([place]), on Metformin 01/31/2016  . Hyperlipidemia associated with  type 2 diabetes mellitus ([place]), on Zocor 05/10/2015  . Vitamin D deficiency, on Vit D 2000 IU daily 05/10/2015  . PAC (premature atrial contraction), controlled with Metoprolol 50 mg daily - failed 25 mg daily 09/19/2011    Past Surgical History:  Procedure Laterality Date  . ABDOMINAL HYSTERECTOMY    . BLADDER SURGERY    . BREAST BIOPSY Left   . CERVICAL CONE BIOPSY    . CHOLECYSTECTOMY N/A 10/26/2014   Procedure:  LAPAROSCOPIC CHOLECYSTECTOMY;  Surgeon: Coralie Keens, MD;  Location: [place];  Service: General;  Laterality: N/A;  . COLONOSCOPY W/ BIOPSIES  multiple  . DIAGNOSTIC LAPAROSCOPY    . DILATION AND CURETTAGE OF UTERUS  07,11  . ESOPHAGOGASTRODUODENOSCOPY  multiple  . EYE SURGERY    . KNEE ARTHROSCOPY Left 11/2008  . LAPAROSCOPIC VAGINAL HYSTERECTOMY WITH SALPINGO OOPHORECTOMY Bilateral 10/03/2015   Procedure: LAPAROSCOPIC ASSISTED VAGINAL HYSTERECTOMY WITH SALPINGECTOMY;  Surgeon: Eldred Manges, MD;  Location: [facility];  Service: Gynecology;  Laterality: Bilateral;  . LASIK  1990   RK  . LEFT FINGER SURGERY    . MASS EXCISION Left 12/24/2012   Procedure: LEFT EXCISION OF PALMAR MASS X TWO;  Surgeon: Roseanne Kaufman, MD;  Location: [facility];  Service: Orthopedics;  Laterality: Left;    OB History    Gravida  1   Para      Term      Preterm      AB  1   Living        SAB  1   TAB      Ectopic      Multiple      Live Births               Home Medications    Prior to Admission medications   Medication Sig Start Date End Date Taking? Authorizing Provider  ALPRAZolam Duanne Moron) 0.5 MG tablet Take 1 tablet (0.5 mg total) by mouth at bedtime as needed for anxiety. 03/30/18   Briscoe Deutscher, DO  aspirin 325 MG tablet Take 325 mg by mouth daily.    [provider]  Blood Glucose Monitoring Suppl (FREESTYLE LITE) DEVI Check blood sugar 2-3 times per day.  Dx:E11.21 10/21/16   Shelda Pal, DO  buPROPion (WELLBUTRIN XL) 150 MG 24 hr tablet Take 1 tablet (150 mg total) by mouth daily. 03/30/18   Briscoe Deutscher, DO  cefdinir (OMNICEF) 300 MG capsule Take 1 capsule (300 mg total) by mouth 2 (two) times daily for 10 days. 05/21/18 05/31/18  Noe Gens, PA-C  cholecalciferol (VITAMIN D) 1000 UNITS tablet Take 1,000 Units by mouth daily.    [provider]  cyclobenzaprine (FLEXERIL) 5 MG tablet Take 1 tablet (5 mg  total) by mouth at bedtime as needed for muscle spasms. 07/25/14   Hudnall, Sharyn Lull, MD  diclofenac sodium (VOLTAREN) 1 % GEL APPLY 2 GRAMS TOPICALLY 2 TIMES DAILY AS NEEDED. NOTE: 2 GRAMS IS EQUIVALENT TO A NICKEL SIZED AMOUNT. 05/30/16   [provider]  glucose blood (FREESTYLE LITE) test strip Check blood sugar 2-3 times per day.  Dx:E11.21. 10/21/16   Shelda Pal, DO  ibuprofen (ADVIL,MOTRIN) 200 MG tablet Take 200 mg by mouth every 6 (six) hours as needed.    [provider]  Lancets (FREESTYLE) lancets Check blood sugar 2-3 times per day.  Dx:E11.21 10/21/16   Shelda Pal, DO  losartan (COZAAR) 50 MG tablet TAKE 1 TABLET (  50 MG TOTAL) BY MOUTH DAILY. 02/10/18   Briscoe Deutscher, DO  metFORMIN (GLUCOPHAGE-XR) 750 MG 24 hr tablet TAKE TWO TABLETS BY MOUTH DAILY BEFORE BREAKFAST 02/10/18   Briscoe Deutscher, DO  metoprolol succinate (TOPROL XL) 25 MG 24 hr tablet Take 1 tablet (25 mg total) by mouth daily. 04/20/18   Briscoe Deutscher, DO  metoprolol succinate (TOPROL-XL) 50 MG 24 hr tablet Take 1 tablet (50 mg total) by mouth daily. Take with or immediately following a meal. 03/31/18   Briscoe Deutscher, DO  Misc. Devices (SCD SOFT SLEEVES/KNEE LENGTH) MISC To be used at home. 06/17/17   Briscoe Deutscher, DO  Omega-3 1000 MG CAPS Take 1 capsule by mouth 4 (four) times daily.    [provider]  saccharomyces boulardii (FLORASTOR) 250 MG capsule Take 1 capsule (250 mg total) by mouth 2 (two) times daily. 02/02/16   Regalado, Belkys A, MD  simvastatin (ZOCOR) 40 MG tablet TAKE 1 TABLET (40 MG TOTAL) BY MOUTH EVERY EVENING. 02/10/18   Briscoe Deutscher, DO  Turmeric 450 MG CAPS Take 900 mg by mouth daily.    [provider]    Family History Family History  Problem Relation Age of Onset  . Heart attack Father   . Heart disease Father   . Coronary artery disease Mother   . Hypertension Mother   . Heart disease Mother   . Heart attack Mother   . Diabetes Mother    . Hyperlipidemia Mother   . Kidney disease Mother   . Depression Mother   . Sleep apnea Mother   . Obesity Mother   . Hypertension Sister   . Hypertension Brother   . Colon cancer Neg Hx     Social History Social History   Tobacco Use  . Smoking status: Never Smoker  . Smokeless tobacco: Never Used  Substance Use Topics  . Alcohol use: Yes    Alcohol/week: 0.0 standard drinks    Comment: social  . Drug use: No     Allergies   Ace inhibitors; Oxycodone-acetaminophen; Vicodin [hydrocodone-acetaminophen]; and Victoza [liraglutide]   Review of Systems Review of Systems  Constitutional: Positive for fever. Negative for chills.  HENT: Positive for congestion, ear discharge, ear pain and sore throat. Negative for trouble swallowing and voice change.   Respiratory: Positive for cough. Negative for shortness of breath.   Cardiovascular: Negative for chest pain and palpitations.  Gastrointestinal: Negative for abdominal pain, diarrhea, nausea and vomiting.  Genitourinary: Negative for dysuria, frequency and hematuria.  Musculoskeletal: Negative for arthralgias, back pain and myalgias.  Skin: Negative for rash.     Physical Exam Triage Vital Signs ED Triage Vitals  Enc Vitals Group     BP 05/21/18 1839 130/81     Pulse Rate 05/21/18 1839 99     Resp --      Temp 05/21/18 1839 99.1 F (37.3 C)     Temp Source 05/21/18 1839 Oral     SpO2 05/21/18 1839 97 %     Weight 05/21/18 1840 232 lb (105.2 kg)     Height 05/21/18 1840 '5\' 6"'  (1.676 m)     Head Circumference --      Peak Flow --      Pain Score 05/21/18 1840 5     Pain Loc --      Pain Edu? --      Excl. in [place]? --    No data found.  Updated Vital Signs BP 130/81 (BP Location: Right Arm)  Pulse 99   Temp 99.1 F (37.3 C) (Oral)   Ht '5\' 6"'  (1.676 m)   Wt 232 lb (105.2 kg)   LMP 05/20/2008   SpO2 97%   BMI 37.45 kg/m   Visual Acuity Right Eye Distance:   Left Eye Distance:   Bilateral Distance:     Right Eye Near:   Left Eye Near:    Bilateral Near:     Physical Exam Vitals signs and nursing note reviewed.  Constitutional:      Appearance: She is well-developed.  HENT:     Head: Normocephalic and atraumatic.     Right Ear: Tympanic membrane normal.     Left Ear: Drainage ( scant dried yellow-bloody discharge in canal) present. Tympanic membrane is erythematous and retracted.     Nose: Nose normal.     Right Sinus: No maxillary sinus tenderness or frontal sinus tenderness.     Left Sinus: No maxillary sinus tenderness or frontal sinus tenderness.     Mouth/Throat:     Lips: Pink.     Mouth: Mucous membranes are moist.     Pharynx: Oropharynx is clear. Uvula midline.  Neck:     Musculoskeletal: Normal range of motion and neck supple.  Cardiovascular:     Rate and Rhythm: Normal rate and regular rhythm.  Pulmonary:     Effort: Pulmonary effort is normal. No respiratory distress.     Breath sounds: Normal breath sounds. No stridor. No wheezing or rhonchi.  Musculoskeletal: Normal range of motion.  Lymphadenopathy:     Cervical: No cervical adenopathy.  Skin:    General: Skin is warm and dry.  Neurological:     Mental Status: She is alert and oriented to person, place, and time.  Psychiatric:        Behavior: Behavior normal.      UC Treatments / Results  Labs (all labs ordered are listed, but only abnormal results are displayed) Labs Reviewed  POCT URINALYSIS DIP (MANUAL ENTRY) - Abnormal; Notable for the following components:      Result Value   Ketones, POC UA trace (5) (*)    Blood, UA trace-lysed (*)    Nitrite, UA Positive (*)    Leukocytes, UA Small (1+) (*)    All other components within normal limits    EKG None  Radiology No results found.  Procedures Procedures (including critical care time)  Medications Ordered in UC Medications - No data to display  Initial Impression / Assessment and Plan / UC Course  I have reviewed the triage vital  signs and the nursing notes.  Pertinent labs & imaging results that were available during my care of the patient were reviewed by me and considered in my medical decision making (see chart for details).     Hx and exam c/w Left AOM secondary to URI UA c/w UTI Culture sent  Final Clinical Impressions(s) / UC Diagnoses   Final diagnoses:  Flank pain  Acute otitis media with perforation, left  Acute cystitis with hematuria     Discharge Instructions      Please take antibiotics as prescribed and be sure to complete entire course even if you start to feel better to ensure infection does not come back.  You may take 580m acetaminophen every 4-6 hours or in combination with ibuprofen 400-6069mevery 6-8 hours as needed for pain, inflammation, and fever.  Be sure to drink at least eight 8oz glasses of water to stay well hydrated and get  at least 8 hours of sleep at night, preferably more while sick.      ED Prescriptions    Medication Sig Dispense Auth. Provider   cefdinir (OMNICEF) 300 MG capsule Take 1 capsule (300 mg total) by mouth 2 (two) times daily for 10 days. 20 capsule Noe Gens, PA-C     Controlled Substance Prescriptions [place] Controlled Substance Registry consulted? Not Applicable   Tyrell Antonio 05/21/18 1919

## 2018-05-21 NOTE — ED Triage Notes (Signed)
Patient c/o 5 days of sore throat and sinus congestion. Fever today. Left ear pain and drainage x today. Would like UA done, has chronic kidney infections and often not symptomatic.

## 2018-05-21 NOTE — Discharge Instructions (Signed)
°  Please take antibiotics as prescribed and be sure to complete entire course even if you start to feel better to ensure infection does not come back.  You may take 500mg  acetaminophen every 4-6 hours or in combination with ibuprofen 400-600mg  every 6-8 hours as needed for pain, inflammation, and fever.  Be sure to drink at least eight 8oz glasses of water to stay well hydrated and get at least 8 hours of sleep at night, preferably more while sick.

## 2018-05-22 ENCOUNTER — Telehealth: Payer: Self-pay

## 2018-05-22 ENCOUNTER — Ambulatory Visit (INDEPENDENT_AMBULATORY_CARE_PROVIDER_SITE_OTHER): Payer: 59 | Admitting: Family Medicine

## 2018-05-22 ENCOUNTER — Encounter: Payer: Self-pay | Admitting: Family Medicine

## 2018-05-22 VITALS — BP 106/78 | HR 66 | Temp 98.3°F | Ht 66.0 in | Wt 231.2 lb

## 2018-05-22 DIAGNOSIS — G4733 Obstructive sleep apnea (adult) (pediatric): Secondary | ICD-10-CM | POA: Diagnosis not present

## 2018-05-22 DIAGNOSIS — H66011 Acute suppurative otitis media with spontaneous rupture of ear drum, right ear: Secondary | ICD-10-CM | POA: Diagnosis not present

## 2018-05-22 MED ORDER — CIPROFLOXACIN-DEXAMETHASONE 0.3-0.1 % OT SUSP: 4.0000 [drp] | Freq: Two times a day (BID) | OTIC | 0 refills | Status: DC

## 2018-05-22 MED ORDER — IPRATROPIUM BROMIDE 0.06 % NA SOLN: 2.0000 | Freq: Four times a day (QID) | NASAL | 0 refills | Status: DC

## 2018-05-22 MED ORDER — DEXAMETHASONE 0.1 % OP SUSP: 2.0000 [drp] | Freq: Two times a day (BID) | OPHTHALMIC | 0 refills | Status: DC

## 2018-05-22 MED ORDER — CIPROFLOXACIN HCL 0.2 % OT SOLN: 0.2000 mL | Freq: Two times a day (BID) | OTIC | 0 refills | Status: DC

## 2018-05-22 MED FILL — METOPROLOL SUCCINATE ER 25: 25 | 90 days supply | Qty: 90 | Fill #1

## 2018-05-22 MED FILL — IPRATROPIUM 0.06% SPRAY: 0.06 | 11 days supply | Qty: 15 | Fill #0

## 2018-05-22 MED FILL — CEFDINIR 300 MG CAPSULE: 300 | 10 days supply | Qty: 20 | Fill #0

## 2018-05-22 MED FILL — DEXAMETHASONE 0.1% EYE DROP: 0.1 | 25 days supply | Qty: 5 | Fill #0

## 2018-05-22 MED FILL — metFORMIN HCL ER 750 MG TB2: 750 | 90 days supply | Qty: 180 | Fill #1

## 2018-05-22 MED FILL — OFLOXACIN 0.3% EAR DROPS: 0.3 | 50 days supply | Qty: 5 | Fill #0

## 2018-05-22 MED FILL — BUPROPION SR 150 MG TABLET: 150 | 30 days supply | Qty: 30 | Fill #1

## 2018-05-22 NOTE — Patient Instructions (Signed)
It was very nice to see you today!  Start the ciprodex drops and the atrovent nasal spray for the next 5-7 days.   Come back in a few weeks to make sure the perforation has healed.  Please make sure you avoid getting water in your ear.   Let us know if your symptoms worsen or do not improve in the next few days.   Take care, Dr Jerline Pain

## 2018-05-22 NOTE — Addendum Note (Signed)
Addended by: Vivi Barrack on: 05/22/2018 04:41 PM   Modules accepted: Orders

## 2018-05-22 NOTE — Telephone Encounter (Signed)
Patient would like alternative for ear drops sent to her pharmacy [facility]).  States the ones that were sent in cost $200.  Please advise.

## 2018-05-22 NOTE — Telephone Encounter (Signed)
Sent in split drops.  Algis Greenhouse. Jerline Pain, MD 05/22/2018 4:41 PM

## 2018-05-22 NOTE — Telephone Encounter (Signed)
Rx Sent  

## 2018-05-22 NOTE — Progress Notes (Signed)
   Subjective:  Alexandra Walker is a 63 y.o. female who presents today for same-day appointment with a chief complaint of ear pain.   HPI:  Ear Pain, Acute problem Patient symptoms started a few days ago with upper respiratory tract infection symptoms including nasal congestion, sore throat, and cough.  Starting yesterday started having severe left pain.  She had a sudden pop with fluid draining out of her left ear.  She was then evaluated at an urgent care where she was found to have a perforated eardrum.  She was found to have possible UTI as well and started on Omnicef.  Symptoms have improved somewhat today.  No fevers or chills.  Some muffled hearing in her left ear.  No other obvious alleviating or aggravating factors.   ROS: Per HPI  PMH: She reports that she has never smoked. She has never used smokeless tobacco. She reports current alcohol use. She reports that she does not use drugs.  Objective:  Physical Exam: BP 106/78 (BP Location: Left Arm, Patient Position: Sitting, Cuff Size: Large)   Pulse 66   Temp 98.3 F (36.8 C) (Oral)   Ht 5\' 6"  (1.676 m)   Wt 231 lb 4 oz (104.9 kg)   LMP 05/20/2008   SpO2 99%   BMI 37.32 kg/m   Gen: NAD, resting comfortably HEENT: Right TM with clear effusion.  Left TM erythematous with purulent effusion.  No clear perforation observed.  Nasal mucosa with edema and discharge bilaterally.  Assessment/Plan:  Otitis media with perforated eardrum No red flags.  She unfortunately suffered spontaneous rupture of her left TM due to her otitis media.  She will continue taking Omnicef which should cover most pathogens of her otitis media.  We will additionally start Atrovent nasal spray to help open up eustachian tubes to help with drainage.  Also start topical eardrops with ofloxacin and dexamethasone (insurance would not cover Ciprodex drops.).  Advised patient to avoid getting water in her ear.  Recommended that she follow-up in a few weeks to ensure  that the perforation is healed completely.  Patient voiced understanding.  Discussed reasons to return to care.  Follow-up as needed.  Algis Greenhouse. Jerline Pain, MD 05/22/2018 5:28 PM

## 2018-05-23 LAB — URINE CULTURE
MICRO NUMBER:: 3493
SPECIMEN QUALITY:: ADEQUATE

## 2018-05-24 ENCOUNTER — Telehealth: Payer: Self-pay

## 2018-05-24 NOTE — Telephone Encounter (Signed)
Spoke with patient, followed up with PCP regarding ear, and given lab results for UCX.

## 2018-05-29 ENCOUNTER — Encounter: Payer: Self-pay | Admitting: Family Medicine

## 2018-05-29 DIAGNOSIS — H9202 Otalgia, left ear: Secondary | ICD-10-CM

## 2018-06-04 DIAGNOSIS — H6122 Impacted cerumen, left ear: Secondary | ICD-10-CM | POA: Diagnosis not present

## 2018-06-04 DIAGNOSIS — H9042 Sensorineural hearing loss, unilateral, left ear, with unrestricted hearing on the contralateral side: Secondary | ICD-10-CM | POA: Diagnosis not present

## 2018-06-04 DIAGNOSIS — H902 Conductive hearing loss, unspecified: Secondary | ICD-10-CM | POA: Diagnosis not present

## 2018-06-09 DIAGNOSIS — N3020 Other chronic cystitis without hematuria: Secondary | ICD-10-CM | POA: Diagnosis not present

## 2018-06-09 DIAGNOSIS — N200 Calculus of kidney: Secondary | ICD-10-CM | POA: Diagnosis not present

## 2018-06-09 DIAGNOSIS — N952 Postmenopausal atrophic vaginitis: Secondary | ICD-10-CM | POA: Diagnosis not present

## 2018-06-09 MED FILL — CIPROFLOXACIN HCL 500 MG TA: 500 | 3 days supply | Qty: 6 | Fill #0

## 2018-06-11 ENCOUNTER — Other Ambulatory Visit: Payer: Self-pay | Admitting: Urology

## 2018-06-12 ENCOUNTER — Encounter: Payer: Self-pay | Admitting: Family Medicine

## 2018-06-12 ENCOUNTER — Other Ambulatory Visit: Payer: Self-pay

## 2018-06-12 ENCOUNTER — Ambulatory Visit: Payer: Self-pay

## 2018-06-12 ENCOUNTER — Telehealth: Payer: Self-pay | Admitting: Family Medicine

## 2018-06-12 MED ORDER — FLUCONAZOLE 150 MG PO TABS: 150.0000 mg | ORAL_TABLET | Freq: Once | ORAL | 0 refills | Status: AC

## 2018-06-12 MED ORDER — NYSTATIN 100000 UNIT/ML MT SUSP: OROMUCOSAL | 0 refills | Status: DC

## 2018-06-12 NOTE — Telephone Encounter (Signed)
Pt called to state that she has been on multiple antibiotics since 05/19/18. She is scheduled to have a surgery soon for a procedure to have a large stone removed. She is experiencing vaginal itching and sore mouth with white areas on her tongue and roof of mouth. She has called to request medication sent in for theses symptoms.  Call was placed to office. Pt was transferred to [facility] for further evaluation and care.  Reason for Disposition . White patches that stick to tongue or inner cheek  Answer Assessment - Initial Assessment Questions 1. SYMPTOM: "What's the main symptom you're concerned about?" (e.g., dry mouth. chapped lips, lump)     Sore throat 2. ONSET: "When did the  Sore throat  start?"     05/19/18 3. PAIN: "Is there any pain?" If so, ask: "How bad is it?" (Scale: 1-10; mild, moderate, severe)     5 4. CAUSE: "What do you think is causing the symptoms?"   Multiple antibiotics 5. OTHER SYMPTOMS: "Do you have any other symptoms?" (e.g., fever, sore throat, toothache, swelling)     White patches tongue roof of mouth 6. PREGNANCY: "Is there any chance you are pregnant?" "When was your last menstrual period?"     N/A  Protocols used: MOUTH [facility]

## 2018-06-12 NOTE — Telephone Encounter (Signed)
Copied from [place] [id number]. Topic: General - Other >> Jun 12, 2018  4:46 PM Yvette Rack wrote: Reason for CRM: Pt stated she is experiencing yeast from the antibiotics. Pt requests a Rx for diflucan. Pt asked that the Rx be sent to [facility], [place] - [address] AT [address] [phone] (Phone)  [phone] (Fax)

## 2018-06-16 ENCOUNTER — Encounter: Payer: Self-pay | Admitting: Family Medicine

## 2018-06-17 ENCOUNTER — Other Ambulatory Visit: Payer: Self-pay

## 2018-06-17 MED ORDER — FLUCONAZOLE 150 MG PO TABS: ORAL_TABLET | ORAL | 0 refills | Status: DC

## 2018-06-17 MED FILL — FLUCONAZOLE 150 MG TABS: 150 | 2 days supply | Qty: 2 | Fill #0

## 2018-06-18 ENCOUNTER — Other Ambulatory Visit: Payer: Self-pay

## 2018-06-18 ENCOUNTER — Encounter (HOSPITAL_BASED_OUTPATIENT_CLINIC_OR_DEPARTMENT_OTHER): Payer: Self-pay

## 2018-06-18 NOTE — Progress Notes (Signed)
Spoke with:  Alexandra Walker NPO:  No food after midnight/Clear liquids until 8:00AM DOS Arrival time: 1215 Labs: CBC, BMP AM medications: Bupropion, Metoprolol, Nasal spray if needed Pre op orders: Yes Ride home:  Kadijah Shamoon (sister) [phone] Instructed to bring CPAP machine day of surgery

## 2018-06-20 ENCOUNTER — Encounter: Payer: Self-pay | Admitting: Family Medicine

## 2018-06-23 MED FILL — SIMVASTATIN 40 MG TABLET: 40 | 60 days supply | Qty: 60 | Fill #2

## 2018-06-23 MED FILL — LOSARTAN POTASSIUM 50 MG TA: 50 | 60 days supply | Qty: 60 | Fill #2

## 2018-06-25 MED ORDER — GENTAMICIN SULFATE 40 MG/ML IJ SOLN
360.0000 mg | INTRAVENOUS | Status: DC
  Filled 2018-06-25: qty 9

## 2018-06-26 ENCOUNTER — Ambulatory Visit (HOSPITAL_BASED_OUTPATIENT_CLINIC_OR_DEPARTMENT_OTHER)
Admission: RE | Admit: 2018-06-26 | Discharge: 2018-06-26 | Disposition: A | Discharge status: Home / Self Care | Payer: 59 | Attending: Urology | Admitting: Urology

## 2018-06-26 ENCOUNTER — Ambulatory Visit (HOSPITAL_BASED_OUTPATIENT_CLINIC_OR_DEPARTMENT_OTHER): Payer: 59 | Admitting: Anesthesiology

## 2018-06-26 ENCOUNTER — Encounter (HOSPITAL_BASED_OUTPATIENT_CLINIC_OR_DEPARTMENT_OTHER)
Admission: RE | Disposition: A | Discharge status: Home / Self Care | Payer: Self-pay | Source: Home / Self Care | Attending: Urology

## 2018-06-26 ENCOUNTER — Encounter (HOSPITAL_BASED_OUTPATIENT_CLINIC_OR_DEPARTMENT_OTHER): Payer: Self-pay | Admitting: *Deleted

## 2018-06-26 DIAGNOSIS — I480 Paroxysmal atrial fibrillation: Secondary | ICD-10-CM | POA: Diagnosis not present

## 2018-06-26 DIAGNOSIS — N132 Hydronephrosis with renal and ureteral calculous obstruction: Secondary | ICD-10-CM | POA: Insufficient documentation

## 2018-06-26 DIAGNOSIS — Z87442 Personal history of urinary calculi: Secondary | ICD-10-CM | POA: Diagnosis not present

## 2018-06-26 DIAGNOSIS — Z7982 Long term (current) use of aspirin: Secondary | ICD-10-CM | POA: Diagnosis not present

## 2018-06-26 DIAGNOSIS — F329 Major depressive disorder, single episode, unspecified: Secondary | ICD-10-CM | POA: Insufficient documentation

## 2018-06-26 DIAGNOSIS — E785 Hyperlipidemia, unspecified: Secondary | ICD-10-CM | POA: Diagnosis not present

## 2018-06-26 DIAGNOSIS — Z7984 Long term (current) use of oral hypoglycemic drugs: Secondary | ICD-10-CM | POA: Diagnosis not present

## 2018-06-26 DIAGNOSIS — Z888 Allergy status to other drugs, medicaments and biological substances status: Secondary | ICD-10-CM | POA: Diagnosis not present

## 2018-06-26 DIAGNOSIS — Z6838 Body mass index (BMI) 38.0-38.9, adult: Secondary | ICD-10-CM | POA: Diagnosis not present

## 2018-06-26 DIAGNOSIS — N200 Calculus of kidney: Secondary | ICD-10-CM | POA: Diagnosis not present

## 2018-06-26 DIAGNOSIS — G4730 Sleep apnea, unspecified: Secondary | ICD-10-CM | POA: Diagnosis not present

## 2018-06-26 DIAGNOSIS — Z885 Allergy status to narcotic agent status: Secondary | ICD-10-CM | POA: Insufficient documentation

## 2018-06-26 DIAGNOSIS — E119 Type 2 diabetes mellitus without complications: Secondary | ICD-10-CM | POA: Diagnosis not present

## 2018-06-26 DIAGNOSIS — Z79899 Other long term (current) drug therapy: Secondary | ICD-10-CM | POA: Insufficient documentation

## 2018-06-26 DIAGNOSIS — E669 Obesity, unspecified: Secondary | ICD-10-CM | POA: Insufficient documentation

## 2018-06-26 DIAGNOSIS — F419 Anxiety disorder, unspecified: Secondary | ICD-10-CM | POA: Insufficient documentation

## 2018-06-26 DIAGNOSIS — I10 Essential (primary) hypertension: Secondary | ICD-10-CM | POA: Insufficient documentation

## 2018-06-26 HISTORY — PX: HOLMIUM LASER APPLICATION: SHX5852

## 2018-06-26 HISTORY — DX: Liver disease, unspecified: K76.9

## 2018-06-26 HISTORY — DX: Unspecified right bundle-branch block: I45.10

## 2018-06-26 HISTORY — DX: Scoliosis, unspecified: M41.9

## 2018-06-26 HISTORY — DX: Atrioventricular block, first degree: I44.0

## 2018-06-26 HISTORY — DX: Hypoxemia: R09.02

## 2018-06-26 HISTORY — DX: Chronic salpingitis: N70.11

## 2018-06-26 HISTORY — DX: Localized edema: R60.0

## 2018-06-26 HISTORY — DX: Personal history of other diseases of the digestive system: Z87.19

## 2018-06-26 HISTORY — DX: Calculus of kidney: N20.0

## 2018-06-26 HISTORY — DX: Unspecified perforation of tympanic membrane, left ear: H72.92

## 2018-06-26 HISTORY — DX: Other specified behavioral and emotional disorders with onset usually occurring in childhood and adolescence: F98.8

## 2018-06-26 HISTORY — DX: Diverticulosis of intestine, part unspecified, without perforation or abscess without bleeding: K57.90

## 2018-06-26 HISTORY — DX: Obesity, unspecified: E66.9

## 2018-06-26 HISTORY — DX: Fatty (change of) liver, not elsewhere classified: K76.0

## 2018-06-26 HISTORY — DX: Thrombocytopenia, unspecified: D69.6

## 2018-06-26 HISTORY — DX: Unspecified Escherichia coli (E. coli) as the cause of diseases classified elsewhere: B96.20

## 2018-06-26 HISTORY — DX: Hematuria, unspecified: R31.9

## 2018-06-26 HISTORY — PX: CYSTOSCOPY WITH RETROGRADE PYELOGRAM, URETEROSCOPY AND STENT PLACEMENT: SHX5789

## 2018-06-26 HISTORY — DX: Personal history of other diseases of the female genital tract: Z87.42

## 2018-06-26 HISTORY — DX: Benign neoplasm of extrahepatic bile ducts: D13.5

## 2018-06-26 HISTORY — DX: Other intervertebral disc degeneration, lumbosacral region: M51.37

## 2018-06-26 HISTORY — DX: Personal history of urinary calculi: Z87.442

## 2018-06-26 HISTORY — DX: Urinary tract infection, site not specified: N39.0

## 2018-06-26 HISTORY — DX: Benign neoplasm of connective and other soft tissue, unspecified: D21.9

## 2018-06-26 LAB — POCT HEMOGLOBIN-HEMACUE: Hemoglobin: 12.5 g/dL (ref 12.0–15.0)

## 2018-06-26 LAB — POCT I-STAT 4, (NA,K, GLUC, HGB,HCT)
Glucose, Bld: 108 mg/dL — ABNORMAL HIGH (ref 70–99)
HEMATOCRIT: 36 % (ref 36.0–46.0)
Hemoglobin: 12.2 g/dL (ref 12.0–15.0)
Potassium: 4.9 mmol/L (ref 3.5–5.1)
Sodium: 139 mmol/L (ref 135–145)

## 2018-06-26 LAB — GLUCOSE, CAPILLARY
Glucose-Capillary: 103 mg/dL — ABNORMAL HIGH (ref 70–99)
Glucose-Capillary: 137 mg/dL — ABNORMAL HIGH (ref 70–99)

## 2018-06-26 LAB — POCT I-STAT CREATININE: Creatinine, Ser: 0.7 mg/dL (ref 0.44–1.00)

## 2018-06-26 SURGERY — CYSTOURETEROSCOPY, WITH RETROGRADE PYELOGRAM AND STENT INSERTION
Anesthesia: General | Site: Renal | Laterality: Left

## 2018-06-26 MED ORDER — DIPHENHYDRAMINE HCL 50 MG/ML IJ SOLN
INTRAMUSCULAR | Status: AC
  Filled 2018-06-26: qty 1

## 2018-06-26 MED ORDER — OXYCODONE HCL 5 MG/5ML PO SOLN
5.0000 mg | Freq: Once | ORAL | Status: DC | PRN
  Filled 2018-06-26: qty 5

## 2018-06-26 MED ORDER — PROPOFOL 10 MG/ML IV BOLUS
INTRAVENOUS | Status: DC | PRN
  Administered 2018-06-26: 30 mg via INTRAVENOUS
  Administered 2018-06-26: 150 mg via INTRAVENOUS

## 2018-06-26 MED ORDER — KETOROLAC TROMETHAMINE 10 MG PO TABS: 10.0000 mg | ORAL_TABLET | Freq: Three times a day (TID) | ORAL | 0 refills | Status: DC | PRN

## 2018-06-26 MED ORDER — CEFAZOLIN SODIUM-DEXTROSE 2-4 GM/100ML-% IV SOLN
INTRAVENOUS | Status: AC
  Filled 2018-06-26: qty 100

## 2018-06-26 MED ORDER — DIPHENHYDRAMINE HCL 50 MG/ML IJ SOLN
INTRAMUSCULAR | Status: DC | PRN
  Administered 2018-06-26: 12.5 mg via INTRAVENOUS

## 2018-06-26 MED ORDER — SCOPOLAMINE 1 MG/3DAYS TD PT72
MEDICATED_PATCH | TRANSDERMAL | Status: AC
  Filled 2018-06-26: qty 1

## 2018-06-26 MED ORDER — MIDAZOLAM HCL 2 MG/2ML IJ SOLN
INTRAMUSCULAR | Status: AC
  Filled 2018-06-26: qty 2

## 2018-06-26 MED ORDER — MIDAZOLAM HCL 5 MG/5ML IJ SOLN
INTRAMUSCULAR | Status: DC | PRN
  Administered 2018-06-26: 2 mg via INTRAVENOUS

## 2018-06-26 MED ORDER — GABAPENTIN 300 MG PO CAPS
ORAL_CAPSULE | ORAL | Status: AC
  Filled 2018-06-26: qty 1

## 2018-06-26 MED ORDER — FENTANYL CITRATE (PF) 100 MCG/2ML IJ SOLN
INTRAMUSCULAR | Status: AC
  Filled 2018-06-26: qty 2

## 2018-06-26 MED ORDER — EPHEDRINE SULFATE-NACL 50-0.9 MG/10ML-% IV SOSY
PREFILLED_SYRINGE | INTRAVENOUS | Status: DC | PRN
  Administered 2018-06-26 (×2): 10 mg via INTRAVENOUS
  Administered 2018-06-26 (×2): 15 mg via INTRAVENOUS

## 2018-06-26 MED ORDER — KETOROLAC TROMETHAMINE 30 MG/ML IJ SOLN
30.0000 mg | Freq: Once | INTRAMUSCULAR | Status: DC | PRN
  Filled 2018-06-26: qty 1

## 2018-06-26 MED ORDER — FENTANYL CITRATE (PF) 100 MCG/2ML IJ SOLN
25.0000 ug | INTRAMUSCULAR | Status: DC | PRN
  Filled 2018-06-26: qty 1

## 2018-06-26 MED ORDER — ONDANSETRON HCL 4 MG/2ML IJ SOLN
4.0000 mg | Freq: Once | INTRAMUSCULAR | Status: DC | PRN
  Filled 2018-06-26: qty 2

## 2018-06-26 MED ORDER — ACETAMINOPHEN 500 MG PO TABS
1000.0000 mg | ORAL_TABLET | Freq: Once | ORAL | Status: AC
  Administered 2018-06-26: 1000 mg via ORAL
  Filled 2018-06-26: qty 2

## 2018-06-26 MED ORDER — GENTAMICIN SULFATE 40 MG/ML IJ SOLN
5.0000 mg/kg | INTRAVENOUS | Status: AC
  Administered 2018-06-26: 390 mg via INTRAVENOUS
  Filled 2018-06-26 (×2): qty 9.75

## 2018-06-26 MED ORDER — KETOROLAC TROMETHAMINE 30 MG/ML IJ SOLN
INTRAMUSCULAR | Status: AC
  Filled 2018-06-26: qty 1

## 2018-06-26 MED ORDER — KETOROLAC TROMETHAMINE 30 MG/ML IJ SOLN
INTRAMUSCULAR | Status: DC | PRN
  Administered 2018-06-26: 30 mg via INTRAVENOUS

## 2018-06-26 MED ORDER — CIPROFLOXACIN HCL 500 MG PO TABS: 500.0000 mg | ORAL_TABLET | Freq: Two times a day (BID) | ORAL | 0 refills | Status: AC

## 2018-06-26 MED ORDER — LACTATED RINGERS IV SOLN
INTRAVENOUS | Status: DC
  Administered 2018-06-26 (×2): via INTRAVENOUS
  Filled 2018-06-26: qty 1000

## 2018-06-26 MED ORDER — GABAPENTIN 300 MG PO CAPS
300.0000 mg | ORAL_CAPSULE | Freq: Once | ORAL | Status: DC
  Filled 2018-06-26: qty 1

## 2018-06-26 MED ORDER — SCOPOLAMINE 1 MG/3DAYS TD PT72
1.0000 | MEDICATED_PATCH | TRANSDERMAL | Status: DC
  Administered 2018-06-26: 1.5 mg via TRANSDERMAL
  Filled 2018-06-26: qty 1

## 2018-06-26 MED ORDER — GLYCOPYRROLATE PF 0.2 MG/ML IJ SOSY
PREFILLED_SYRINGE | INTRAMUSCULAR | Status: AC
  Filled 2018-06-26: qty 1

## 2018-06-26 MED ORDER — METOCLOPRAMIDE HCL 5 MG/ML IJ SOLN
INTRAMUSCULAR | Status: DC | PRN
  Administered 2018-06-26: 10 mg via INTRAVENOUS

## 2018-06-26 MED ORDER — GLYCOPYRROLATE PF 0.2 MG/ML IJ SOSY
PREFILLED_SYRINGE | INTRAMUSCULAR | Status: DC | PRN
  Administered 2018-06-26: .1 mg via INTRAVENOUS

## 2018-06-26 MED ORDER — ONDANSETRON HCL 4 MG/2ML IJ SOLN
INTRAMUSCULAR | Status: DC | PRN
  Administered 2018-06-26: 4 mg via INTRAVENOUS

## 2018-06-26 MED ORDER — DEXAMETHASONE SODIUM PHOSPHATE 10 MG/ML IJ SOLN
INTRAMUSCULAR | Status: AC
  Filled 2018-06-26: qty 1

## 2018-06-26 MED ORDER — SODIUM CHLORIDE 0.9 % IR SOLN
Status: DC | PRN
  Administered 2018-06-26: 3000 mL

## 2018-06-26 MED ORDER — ACETAMINOPHEN 500 MG PO TABS
ORAL_TABLET | ORAL | Status: AC
  Filled 2018-06-26: qty 2

## 2018-06-26 MED ORDER — ONDANSETRON HCL 4 MG/2ML IJ SOLN
INTRAMUSCULAR | Status: AC
  Filled 2018-06-26: qty 2

## 2018-06-26 MED ORDER — FENTANYL CITRATE (PF) 100 MCG/2ML IJ SOLN
INTRAMUSCULAR | Status: DC | PRN
  Administered 2018-06-26 (×2): 50 ug via INTRAVENOUS

## 2018-06-26 MED ORDER — DEXAMETHASONE SODIUM PHOSPHATE 4 MG/ML IJ SOLN
INTRAMUSCULAR | Status: DC | PRN
  Administered 2018-06-26: 10 mg via INTRAVENOUS

## 2018-06-26 MED ORDER — TRAMADOL HCL 50 MG PO TABS: 50.0000 mg | ORAL_TABLET | Freq: Four times a day (QID) | ORAL | 0 refills | Status: DC | PRN

## 2018-06-26 MED ORDER — LIDOCAINE 2% (20 MG/ML) 5 ML SYRINGE
INTRAMUSCULAR | Status: DC | PRN
  Administered 2018-06-26: 100 mg via INTRAVENOUS

## 2018-06-26 MED ORDER — METOCLOPRAMIDE HCL 5 MG/ML IJ SOLN
INTRAMUSCULAR | Status: AC
  Filled 2018-06-26: qty 2

## 2018-06-26 MED ORDER — EPHEDRINE 5 MG/ML INJ
INTRAVENOUS | Status: AC
  Filled 2018-06-26: qty 10

## 2018-06-26 MED ORDER — OXYCODONE HCL 5 MG PO TABS
5.0000 mg | ORAL_TABLET | Freq: Once | ORAL | Status: DC | PRN
  Filled 2018-06-26: qty 1

## 2018-06-26 MED ORDER — PROPOFOL 10 MG/ML IV BOLUS
INTRAVENOUS | Status: AC
  Filled 2018-06-26: qty 20

## 2018-06-26 MED ORDER — LIDOCAINE 2% (20 MG/ML) 5 ML SYRINGE
INTRAMUSCULAR | Status: AC
  Filled 2018-06-26: qty 5

## 2018-06-26 MED ORDER — IOHEXOL 300 MG/ML  SOLN
INTRAMUSCULAR | Status: DC | PRN
  Administered 2018-06-26: 10 mL

## 2018-06-26 MED ORDER — SENNOSIDES-DOCUSATE SODIUM 8.6-50 MG PO TABS: 1.0000 | ORAL_TABLET | Freq: Two times a day (BID) | ORAL | 0 refills | Status: DC

## 2018-06-26 MED FILL — SENNA PLUS 8.6-50 MG TABS: 8.6-50 | 50 days supply | Qty: 100 | Fill #0

## 2018-06-26 MED FILL — CIPROFLOXACIN HCL 500 MG TA: 500 | 3 days supply | Qty: 6 | Fill #0

## 2018-06-26 MED FILL — traMADol HCL 50 MG TABS: 50 | 3 days supply | Qty: 20 | Fill #0

## 2018-06-26 MED FILL — KETOROLAC 10 MG TABLET: 10 | 6 days supply | Qty: 20 | Fill #0

## 2018-06-26 SURGICAL SUPPLY — 26 items
BAG DRAIN URO-CYSTO SKYTR STRL (DRAIN) ×2 IMPLANT
BAG DRN UROCATH (DRAIN) ×1
BASKET LASER NITINOL 1.9FR (BASKET) ×1 IMPLANT
BSKT STON RTRVL 120 1.9FR (BASKET) ×1
CATH INTERMIT  6FR 70CM (CATHETERS) ×1 IMPLANT
CLOTH BEACON ORANGE TIMEOUT ST (SAFETY) ×2 IMPLANT
FIBER LASER FLEXIVA 365 (UROLOGICAL SUPPLIES) IMPLANT
FIBER LASER TRAC TIP (UROLOGICAL SUPPLIES) ×1 IMPLANT
GLOVE BIO SURGEON STRL SZ7.5 (GLOVE) ×2 IMPLANT
GOWN STRL REUS W/TWL LRG LVL3 (GOWN DISPOSABLE) ×2 IMPLANT
GUIDEWIRE ANG ZIPWIRE 038X150 (WIRE) ×2 IMPLANT
GUIDEWIRE STR DUAL SENSOR (WIRE) ×2 IMPLANT
INFUSOR MANOMETER BAG 3000ML (MISCELLANEOUS) ×1 IMPLANT
IV NS 1000ML (IV SOLUTION)
IV NS 1000ML BAXH (IV SOLUTION) ×1 IMPLANT
IV NS IRRIG 3000ML ARTHROMATIC (IV SOLUTION) ×2 IMPLANT
KIT TURNOVER CYSTO (KITS) ×2 IMPLANT
MANIFOLD NEPTUNE II (INSTRUMENTS) ×2 IMPLANT
NS IRRIG 500ML POUR BTL (IV SOLUTION) ×3 IMPLANT
PACK CYSTO (CUSTOM PROCEDURE TRAY) ×2 IMPLANT
SHEATH URET ACCESS 12FR/35CM (UROLOGICAL SUPPLIES) ×1 IMPLANT
STENT POLARIS 5FRX22 (STENTS) ×1 IMPLANT
SYR 10ML LL (SYRINGE) ×2 IMPLANT
TUBE CONNECTING 12X1/4 (SUCTIONS) IMPLANT
TUBE FEEDING 8FR 16IN STR KANG (MISCELLANEOUS) ×1 IMPLANT
TUBING UROLOGY SET (TUBING) ×1 IMPLANT

## 2018-06-26 NOTE — Op Note (Signed)
NAME: Alexandra Walker, Alexandra Walker MEDICAL RECORD [id number] ACCOUNT [id number] DATE OF BIRTH:1956/01/23 FACILITY: [facility] LOCATION: [facility] PHYSICIAN:Sarahmarie Leavey, MD  OPERATIVE REPORT  DATE OF PROCEDURE:  06/26/2018  PREOPERATIVE DIAGNOSIS:  Left renal stones, history of recurrent infections.  PROCEDURE: 1.  Cystoscopy, left retrograde pyelogram, interpretation. 2.  Left ureteroscopy with laser lithotripsy. 3.  Insertion of left ureteral stent 5 x 22 Polaris, no tether.  ESTIMATED BLOOD LOSS:  Nil.  COMPLICATIONS:  None.  SPECIMENS:  Left renal UPJ stone fragments for analysis.  FINDINGS: 1.  Left ureteropelvic junction stone with intermittent obstruction.  This was somewhat impacted. 2.  Small papillary tip left mid renal stone. 3.  Complete resolution of all accessible stone fragments larger than one-third mm following laser lithotripsy and basket extraction. 4.  Successful placement of left ureteral stent proximal end renal pelvis, distal end in urinary bladder.  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  She was found on evaluation of this to have a significant volume left renal stones.  Given her history, it was felt that  these very well may be colonized and contributing to her infection risk.  Options were discussed including observation versus laser treatment of her stones as her stones are multifocal and the goal of this is stone free.  We discussed ureteroscopy, and  she wished to proceed with this.  Informed consent was obtained and placed in medical record.  Notably, her most recent urine culture is negative.  She has been on culture specific antibiotics according to most recent prior positive.  DESCRIPTION OF PROCEDURE:  The patient was identified.  Procedure being left ureteroscopic stimulation was confirmed.  Procedure timeout performed.  Intravenous antibiotics administered.   General anesthesia induced.  The patient was placed into a low  lithotomy position, sterile field was created prepping and draping the vagina, introitus, and proximal thighs using iodine.  Cystourethroscopy was performed with a 22-French rigid cystoscope with offset lens.  Inspection of the urinary bladder revealed  no diverticula, calcifications, papillary lesions.  The left ureteral orifice was cannulated with 6-French renal catheter and left retrograde pyelogram was obtained.  Left retrograde pyelogram demonstrated a single left ureter single system left kidney.  There was a filling defect in the area of the UPJ with mild hydronephrosis above this consistent with likely ball-valving and UPJ position of known renal stone.  A  0.03 ZIPwire was advanced to lower pole and set aside as a safety wire.  An 8-French feeding tube was placed in the urinary bladder for pressure release, and semirigid ureteroscopy was performed of the distal orifice of the left ureter alongside a  separate sensor working wire.  As expected, there was a stone at the UPJ that was easily and retrograde positioned to the area of the kidney and a semirigid scope was exchanged for a 12/14 short length ureteral access sheath to the level of the proximal  ureter using continuous fluoroscopic guidance over the sensor working wire and flexible digital ureteroscopy performed the left proximal ureter and systematic inspection of the left kidney, including all calices x3.  The previous UPJ stone was now free  floating within the renal pelvis.  It appeared to be much too large for simple basketing.  There was also another mid pole papillary tip calcification.  The UPJ stone was wedged into an upper pole infundibulum and holmium laser energy applied 70 degrees  using settings of 0.2 joules and 20  Hz.  Using a dusting technique, approximately 70% of stone volume was completely ablated.  The remaining 30% was then fragmented into pieces 1-2 mm that  were then grasped with an escape basket on the long axis, removed  and set aside for analysis.  The papillary tip calcification was similarly ablated following laser energy.  Following this, we had excellent hemostasis.  No evidence of renal perforation.  There was complete resolution of all accessible stone fragments  larger than one-third mm and there was again significant mucosal edema at the prior site of stone impaction at the area of the UPJ.  It was felt that interval stenting would be warranted without tether.  The access sheath was removed under continuous  vision.  No additional findings were encountered and a new 5 x 22 Polaris-type stent was placed remaining safety wire using fluoroscopic guidance.  Good proximal and distal plane were noted, and the procedure terminated.  The patient tolerated the  procedure well.  No immediate perioperative complications.  The patient was taken to postanesthesia care in stable condition.  TN/NUANCE  D:06/26/2018 T:06/26/2018 JOB:[id number]

## 2018-06-26 NOTE — H&P (Signed)
Alexandra Walker is an 63 y.o. female.    Chief Complaint: Pre-op LEFT Ureteroscopic Stone Manipulation  HPI:   1 - Recurrent Urinary Infections - several episode per year of UTI, few episodes prior pyelo and even sepsis. Most recetn UCX 05/2018 e. coli sens nitro / cipro / augmentin, RES others. CT 04/2018 with LLP sotne as per below. PVR 05/2018 " 0 mL" (normal). Pelvic 05/2018 with age appropriate atrophy. She is diabetic but well controlle.d   2 - Urolithiasis - LLP 33m stoens and 410mmid stones w/o hydro by CT 2019. SSD 11cm, 850HU.   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 No h/o estrogen sensitive malignancy.   4 - Stress Urinary Incontinence - s/p TOT mid 2000s with relief of stress leakage.   PMH sig for LAVH (benign), diagnostic laparoscopy x several, obesity, DM2 (A1c 6s), Anxiety/SNRI/Benzos. SHe is RNChief of Staffn HIClintonHer PCP is ErBriscoe DeutscherO.   Today " MaLonettais seen to proceed with LEFT ureteroscopic stone manipulation with goal of stone free to reduce possible nidus for recurrent infections. Most recent UA without infectious parameters. She has been on Cipro x few days pre-op as instructed based on most recent CX data.    Past Medical History:  Diagnosis Date  . ADD (attention deficit disorder)   . Adenomyomatosis of gallbladder   . 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H[place]  . Bilateral lower extremity edema   . Cervical intraepithelial neoplasia grade 1 03/17/2017  . Chronic ankle edema   . Chronic depressive disorder   . Circadian rhythm sleep disorder, shift work   . 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 Diabetes (HCG[place]ena  . Diverticulosis    Minimal  . E. coli urinary tract infection 05/2018  . Eardrum rupture, left 05/2018  . Fatty liver disease, nonalcoholic   . Female bladder prolapse 03/17/2017  . First degree heart block by electrocardiogram 11/05/2017  . GERD (gastroesophageal reflux disease)   . Glucose intolerance  (impaired glucose tolerance)   . Hematuria 2014  . Hepatic steatosis   . History of hiatal hernia 2017   Small  . History of kidney stones   . History of ovarian cyst 10/1999   Right, ruptured  . HLA B27 (HLA B27 positive)   . HLA-B27 positive 03/29/2018  . Hx of pancreatitis   . Hydrosalpinx 10/1999   Right  . Hyperlipidemia   . Hyperopia with astigmatism and presbyopia, bilateral 10/27/2017  . Hypertension   . Hypoxemia    uses CPAP at night  . IBS (irritable bowel syndrome)   . Ischemic colitis ([place]CTanquecitos South [place]  . Keratoconjunctivitis sicca of both eyes not specified as Sjogren's 10/27/2017  . Kyphosis   . Lateral epicondylitis of right elbow   . Leiomyoma 2008   Retroflexed retroverted uterus containing a single leiomyoma 1.7 cm greatest  . LGSIL (low grade squamous intraepithelial dysplasia) 08/05/2015  . Liver lesion 1999   Focal nodular hyperplasia  . Lumbar scoliosis 2010   Mild  . Migraine headache    per menopause  . Migraine headache without aura 05/10/2015  . Nephrolithiasis 2014   Left, minimal  . Obese   . Osteoarthritis   . PAC (premature atrial contraction)   . Paronychia   . Paroxysmal atrial fibrillation (HCC)   . PMS (premenstrual syndrome)   . PONV (postoperative nausea and vomiting)    had no vomitting with anesthesia from women's  . Pyelonephritis 01/2016  . Pyelonephritis 01/2016  .  Right bundle branch block   . Sacroiliitis ([place])   . Seasonal allergies   . Sepsis ([place]) 01/2016  . SVT (supraventricular tachycardia) ([place])    a. 01/2016 - SVT, wide complex tachycardia, and brief afib in setting of severe sepsis.  . Thrombocytopenia ([place]) 01/2016   during sepsis  . Trigger ring finger of left hand   . UTI (urinary tract infection) 2014  . Vitamin D deficiency     Past Surgical History:  Procedure Laterality Date  . BLADDER SURGERY    . BREAST BIOPSY Left   . CERVICAL CONE BIOPSY    . CHOLECYSTECTOMY N/A 10/26/2014   Procedure: LAPAROSCOPIC  CHOLECYSTECTOMY;  Surgeon: Coralie Keens, MD;  Location: [facility];  Service: General;  Laterality: N/A;  . COLONOSCOPY W/ BIOPSIES  multiple  . DIAGNOSTIC LAPAROSCOPY    . DILATION AND CURETTAGE OF UTERUS  07,11  . DUPUYTREN CONTRACTURE RELEASE    . ESOPHAGOGASTRODUODENOSCOPY  multiple  . EYE SURGERY    . KNEE ARTHROSCOPY Left 11/2008  . LAPAROSCOPIC VAGINAL HYSTERECTOMY WITH SALPINGO OOPHORECTOMY Bilateral 10/03/2015   Procedure: LAPAROSCOPIC ASSISTED VAGINAL HYSTERECTOMY WITH SALPINGECTOMY;  Surgeon: Eldred Manges, MD;  Location: [facility];  Service: Gynecology;  Laterality: Bilateral;  . LASIK  1990   RK  . LEFT FINGER SURGERY    . LIVER BIOPSY    . MASS EXCISION Left 12/24/2012   Procedure: LEFT EXCISION OF PALMAR MASS X TWO;  Surgeon: Roseanne Kaufman, MD;  Location: [facility];  Service: Orthopedics;  Laterality: Left;  . PHOTOREFRACTIVE KERATOTOMY  88    Family History  Problem Relation Age of Onset  . Heart attack Father   . Heart disease Father   . Coronary artery disease Mother   . Hypertension Mother   . Heart disease Mother   . Heart attack Mother   . Diabetes Mother   . Hyperlipidemia Mother   . Kidney disease Mother   . Depression Mother   . Sleep apnea Mother   . Obesity Mother   . Hypertension Sister   . Hypertension Brother   . Colon cancer Neg Hx    Social History:  reports that she has never smoked. She has never used smokeless tobacco. She reports current alcohol use. She reports that she does not use drugs.  Allergies:  Allergies  Allergen Reactions  . Ace Inhibitors Other (See Comments)    coughing  . Oxycodone-Acetaminophen Nausea And Vomiting  . Vicodin [Hydrocodone-Acetaminophen] Nausea And Vomiting  . Victoza [Liraglutide] Other (See Comments)    pancreatitis  . Adhesive [Tape] Rash    Not on stomach    No medications prior to admission.    No results found for this or any previous visit (from the past  48 hour(s)). No results found.  Review of Systems  Constitutional: Negative.  Negative for chills and fever.  HENT: Negative.   Eyes: Negative.   Respiratory: Negative.   Cardiovascular: Negative.   Gastrointestinal: Negative.   Genitourinary: Negative.   Musculoskeletal: Negative.   Skin: Negative.   Neurological: Negative.   Endo/Heme/Allergies: Negative.   Psychiatric/Behavioral: Negative.     Height '5\' 6"'  (1.676 m), weight 103.4 kg, last menstrual period 05/20/2008. Physical Exam  Constitutional: She appears well-developed.  HENT:  Head: Normocephalic.  Eyes: Pupils are equal, round, and reactive to light.  Neck: Normal range of motion.  Cardiovascular: Normal rate.  Respiratory: Effort normal.  GI: Soft.  Genitourinary:    Genitourinary Comments: NO  CVAT at present.    Musculoskeletal: Normal range of motion.  Neurological: She is alert.  Skin: Skin is warm.  Psychiatric: She has a normal mood and affect.     Assessment/Plan  Proceed as planned with LEFT ureteroscopic stone manipulation. Risks, benefits, alternatives, expected peri-op course discussed previously and reiterated today.   Alexis Frock, MD 06/26/2018, 9:16 AM

## 2018-06-26 NOTE — Transfer of Care (Signed)
  Last Vitals:  Vitals Value Taken Time  BP 166/77 06/26/2018  2:39 PM  Temp 36.6 C 06/26/2018  2:39 PM  Pulse 64 06/26/2018  2:41 PM  Resp 22 06/26/2018  2:41 PM  SpO2 95 % 06/26/2018  2:41 PM  Vitals shown include unvalidated device data.  Last Pain:  Vitals:   06/26/18 1025  TempSrc: Oral         Immediate Anesthesia Transfer of Care Note  Patient: Alexandra Walker  Procedure(s) Performed: Procedure(s) (LRB): CYSTOSCOPY WITH RETROGRADE PYELOGRAM, URETEROSCOPY AND STENT PLACEMENT (Left) HOLMIUM LASER APPLICATION (Left)  Patient Location: PACU  Anesthesia Type: General  Level of Consciousness: awake, alert  and oriented  Airway & Oxygen Therapy: Patient Spontanous Breathing and Patient connected to nasal cannula oxygen  Post-op Assessment: Report given to PACU RN and Post -op Vital signs reviewed and stable  Post vital signs: Reviewed and stable  Complications: No apparent anesthesia complications

## 2018-06-26 NOTE — Brief Op Note (Signed)
06/26/2018  2:25 PM  PATIENT:  Alexandra Walker  63 y.o. female  PRE-OPERATIVE DIAGNOSIS:  LEFT RENAL STONES  POST-OPERATIVE DIAGNOSIS:  LEFT RENAL STONES  PROCEDURE:  Procedure(s): CYSTOSCOPY WITH RETROGRADE PYELOGRAM, URETEROSCOPY AND STENT PLACEMENT (Left) HOLMIUM LASER APPLICATION (Left)  SURGEON:  Surgeon(s) and Role:    Alexis Frock, MD - Primary  PHYSICIAN ASSISTANT:   ASSISTANTS: none   ANESTHESIA:   general  EBL:  10 mL   BLOOD ADMINISTERED:none  DRAINS: none   LOCAL MEDICATIONS USED:  NONE  SPECIMEN:  Source of Specimen:  Left renal / UPJ stone fragments  DISPOSITION OF SPECIMEN:  [facility] for compositional analysis  COUNTS:  YES  TOURNIQUET:  * No tourniquets in log *  DICTATION: .Other Dictation: Dictation Number [id number]  PLAN OF CARE: Discharge to home after PACU  PATIENT DISPOSITION:  PACU - hemodynamically stable.   Delay start of Pharmacological VTE agent (>24hrs) due to surgical blood loss or risk of bleeding: yes

## 2018-06-26 NOTE — Discharge Instructions (Signed)
1 - You may have urinary urgency (bladder spasms) and bloody urine on / off with stent in place. This is normal. ° °2 - Call MD or go to ER for fever >102, severe pain / nausea / vomiting not relieved by medications, or acute change in medical status °[facility] °[phone] °Post Ureteroscopy With or Without Stent Instructions ° °Definitions: ° °Ureter: The duct that transports urine from the kidney to the bladder. °Stent:   A plastic hollow tube that is placed into the ureter, from the kidney to the                 bladder to prevent the ureter from swelling shut. ° °GENERAL INSTRUCTIONS: ° °Despite the fact that no skin incisions were used, the area around the ureter and bladder is raw and irritated. The stent is a foreign body which will further irritate the bladder wall. This irritation is manifested by increased frequency of urination, both day and night, and by an increase in the urge to urinate. In some, the urge to urinate is present almost always. Sometimes the urge is strong enough that you may not be able to stop yourself from urinating. The only real cure is to remove the stent and then give time for the bladder wall to heal which can't be done until the danger of the ureter swelling shut has passed, which varies. ° °You may see some blood in your urine while the stent is in place and a few days afterwards. Do not be alarmed, even if the urine was clear for a while. Get off your feet and drink lots of fluids until clearing occurs. If you start to pass clots or don't improve, call us. ° °DIET: °You may return to your normal diet immediately. Because of the raw surface of your bladder, alcohol, spicy foods, acid type foods and drinks with caffeine may cause irritation or frequency and should be used in moderation. To keep your urine flowing freely and to avoid constipation, drink plenty of fluids during the day ( 8-10 glasses ). °Tip: Avoid cranberry juice because it is very  acidic. ° °ACTIVITY: °Your physical activity doesn't need to be restricted. However, if you are very active, you may see some blood in your urine. We suggest that you reduce your activity under these circumstances until the bleeding has stopped. ° °BOWELS: °It is important to keep your bowels regular during the postoperative period. Straining with bowel movements can cause bleeding. A bowel movement every other day is reasonable. Use a mild laxative if needed, such as Milk of Magnesia 2-3 tablespoons, or 2 Dulcolax tablets. Call if you continue to have problems. If you have been taking narcotics for pain, before, during or after your surgery, you may be constipated. Take a laxative if necessary. ° ° °MEDICATION: °You should resume your pre-surgery medications unless told not to. In addition you will often be given an antibiotic to prevent infection. These should be taken as prescribed until the bottles are finished unless you are having an unusual reaction to one of the drugs. ° °PROBLEMS YOU SHOULD REPORT TO US: °· Fevers over 100.5 Fahrenheit. °· Heavy bleeding, or clots ( See above notes about blood in urine ). °· Inability to urinate. °· Drug reactions ( hives, rash, nausea, vomiting, diarrhea ). °· Severe burning or pain with urination that is not improving. ° °FOLLOW-UP: °You will need a follow-up appointment to monitor your progress. Call for this appointment at the number listed above.   Usually the first appointment will be about three to fourteen days after your surgery. ° ° ° ° ° °Post Anesthesia Home Care Instructions ° °Activity: °Get plenty of rest for the remainder of the day. A responsible adult should stay with you for 24 hours following the procedure.  °For the next 24 hours, DO NOT: °-Drive a car °-Operate machinery °-Drink alcoholic beverages °-Take any medication unless instructed by your physician °-Make any legal decisions or sign important papers. ° °Meals: °Start with liquid foods such as  gelatin or soup. Progress to regular foods as tolerated. Avoid greasy, spicy, heavy foods. If nausea and/or vomiting occur, drink only clear liquids until the nausea and/or vomiting subsides. Call your physician if vomiting continues. ° °Special Instructions/Symptoms: °Your throat may feel dry or sore from the anesthesia or the breathing tube placed in your throat during surgery. If this causes discomfort, gargle with warm salt water. The discomfort should disappear within 24 hours. ° °If you had a scopolamine patch placed behind your ear for the management of post- operative nausea and/or vomiting: ° °1. The medication in the patch is effective for 72 hours, after which it should be removed.  Wrap patch in a tissue and discard in the trash. Wash hands thoroughly with soap and water. °2. You may remove the patch earlier than 72 hours if you experience unpleasant side effects which may include dry mouth, dizziness or visual disturbances. °3. Avoid touching the patch. Wash your hands with soap and water after contact with the patch. °  ° °

## 2018-06-26 NOTE — Anesthesia Procedure Notes (Signed)
Procedure Name: LMA Insertion Date/Time: 06/26/2018 1:19 PM Performed by: Barnet Glasgow, MD Pre-anesthesia Checklist: Patient identified, Emergency Drugs available, Suction available and Patient being monitored Patient Re-evaluated:Patient Re-evaluated prior to induction Oxygen Delivery Method: Circle system utilized Preoxygenation: Pre-oxygenation with 100% oxygen Induction Type: IV induction Ventilation: Mask ventilation without difficulty LMA: LMA inserted LMA Size: 4.0 Number of attempts: 1 Airway Equipment and Method: Bite block Placement Confirmation: positive ETCO2 Tube secured with: Tape Dental Injury: Teeth and Oropharynx as per pre-operative assessment

## 2018-06-26 NOTE — Anesthesia Preprocedure Evaluation (Addendum)
Anesthesia Evaluation  Patient identified by MRN, date of birth, ID band Patient awake    Reviewed: Allergy & Precautions, NPO status , Patient's Chart, lab work & pertinent test results  History of Anesthesia Complications (+) PONV  Airway Mallampati: I  TM Distance: >3 FB Neck ROM: Full    Dental no notable dental hx. (+) Teeth Intact, Dental Advisory Given   Pulmonary sleep apnea and Continuous Positive Airway Pressure Ventilation ,    Pulmonary exam normal breath sounds clear to auscultation       Cardiovascular Exercise Tolerance: Good hypertension, Pt. on medications Normal cardiovascular exam+ dysrhythmias Supra Ventricular Tachycardia  Rhythm:Regular Rate:Normal  Hx of SVT w Sepsis   Neuro/Psych PSYCHIATRIC DISORDERS Depression    GI/Hepatic hiatal hernia, GERD  ,  Endo/Other  diabetesHx of pancreatitis  Renal/GU      Musculoskeletal  (+) Arthritis ,   Abdominal (+) + obese,   Peds  Hematology   Anesthesia Other Findings   Reproductive/Obstetrics                            Anesthesia Physical Anesthesia Plan  ASA: III  Anesthesia Plan: General   Post-op Pain Management:    Induction: Intravenous  PONV Risk Score and Plan: Scopolamine patch - Pre-op, Midazolam, Dexamethasone, Ondansetron and Treatment may vary due to age or medical condition  Airway Management Planned: LMA  Additional Equipment:   Intra-op Plan:   Post-operative Plan: Extubation in OR  Informed Consent: I have reviewed the patients History and Physical, chart, labs and discussed the procedure including the risks, benefits and alternatives for the proposed anesthesia with the patient or authorized representative who has indicated his/her understanding and acceptance.     Dental advisory given  Plan Discussed with: CRNA  Anesthesia Plan Comments:         Anesthesia Quick Evaluation

## 2018-06-28 ENCOUNTER — Other Ambulatory Visit: Payer: Self-pay | Admitting: Urology

## 2018-06-29 ENCOUNTER — Encounter (HOSPITAL_BASED_OUTPATIENT_CLINIC_OR_DEPARTMENT_OTHER): Payer: Self-pay | Admitting: Urology

## 2018-06-29 MED FILL — FREESTYLE LITE TEST STRIP: 30 days supply | Qty: 100 | Fill #0

## 2018-06-29 NOTE — Anesthesia Postprocedure Evaluation (Signed)
Anesthesia Post Note  Patient: Alexandra Walker  Procedure(s) Performed: CYSTOSCOPY WITH RETROGRADE PYELOGRAM, URETEROSCOPY AND STENT PLACEMENT (Left Renal) HOLMIUM LASER APPLICATION (Left Renal)     Patient location during evaluation: Endoscopy Anesthesia Type: General Level of consciousness: awake Pain management: pain level controlled Vital Signs Assessment: post-procedure vital signs reviewed and stable Respiratory status: spontaneous breathing Cardiovascular status: stable Postop Assessment: no apparent nausea or vomiting Anesthetic complications: no    Last Vitals:  Vitals:   06/26/18 1530 06/26/18 1551  BP: (!) 153/76 (!) 156/81  Pulse: 63 (!) 59  Resp: (!) 22 16  Temp:  36.6 C  SpO2: 90% 95%    Last Pain:  Vitals:   06/26/18 1551  TempSrc:   PainSc: 4    Pain Goal:                   Huston Foley

## 2018-07-13 DIAGNOSIS — N200 Calculus of kidney: Secondary | ICD-10-CM | POA: Diagnosis not present

## 2018-07-19 DEATH — deceased

## 2018-07-23 ENCOUNTER — Encounter: Payer: Self-pay | Admitting: Family Medicine

## 2018-07-23 MED FILL — buPROPion HCL ER (XL) 150 M: 150 | 90 days supply | Qty: 90 | Fill #1

## 2018-07-23 MED FILL — METOPROLOL SUCCINATE ER 50: 50 | 90 days supply | Qty: 90 | Fill #0

## 2018-07-24 MED ORDER — CYCLOBENZAPRINE HCL 10 MG PO TABS: 10.0000 mg | ORAL_TABLET | Freq: Three times a day (TID) | ORAL | 0 refills | Status: DC | PRN

## 2018-07-24 MED FILL — CYCLOBENZAPRINE HCL 10 MG T: 10 | 6 days supply | Qty: 20 | Fill #0

## 2018-08-02 ENCOUNTER — Encounter: Payer: Self-pay | Admitting: Neurology

## 2018-08-05 ENCOUNTER — Encounter: Payer: Self-pay | Admitting: Neurology

## 2018-08-05 ENCOUNTER — Other Ambulatory Visit: Payer: Self-pay

## 2018-08-05 ENCOUNTER — Ambulatory Visit (INDEPENDENT_AMBULATORY_CARE_PROVIDER_SITE_OTHER): Payer: 59 | Admitting: Neurology

## 2018-08-05 DIAGNOSIS — D6861 Antiphospholipid syndrome: Secondary | ICD-10-CM

## 2018-08-05 DIAGNOSIS — I480 Paroxysmal atrial fibrillation: Secondary | ICD-10-CM | POA: Diagnosis not present

## 2018-08-05 DIAGNOSIS — E1159 Type 2 diabetes mellitus with other circulatory complications: Secondary | ICD-10-CM

## 2018-08-05 DIAGNOSIS — I10 Essential (primary) hypertension: Secondary | ICD-10-CM

## 2018-08-05 DIAGNOSIS — I152 Hypertension secondary to endocrine disorders: Secondary | ICD-10-CM

## 2018-08-05 NOTE — Progress Notes (Signed)
SLEEP MEDICINE CLINIC   Provider:  Larey Seat, MD   Referring Provider: Briscoe Deutscher, DO Primary Care Physician:  Briscoe Deutscher, DO  Chief Complaint  Patient presents with   Follow-up    pt alone, rm 11. pt states that everything is well. machine working well. DME [employer]    HPI: 08-05-2018. 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  It was a busy night again she reports.   Patient with anticardiolipid ab syndrome, atrial fib, DM 2,   Alexandra Walker is a 63 y.o. female [employer] employee, she just returned form a conference on [place]. 100 people. She came home on Sunday the 15th.  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  The patient has an overall compliance of 27 out of 30 days, she used the machine over 4 hours on 63% of these days.  This is also due to her restricted daytime ability to sleep.  For a night shift worker this is a good compliance.  The machine has a set pressure of 8 cmH2O with 3 cm EPR and her residual AHI is 0.2 excellent resolution no air leaks, I am very happy with his compliance.  Her depression score was endorsed at 3 out of 15 points, the Epworth sleepiness score was endorsed at 5 points fatigue severity score at 34 points. I did renew Alexandra Walker CPAP supply prescription for the coming year.  mallampatti 3, neck circumference 15 ".     Interval history from 09/26/2016. Alexandra Walker returned after a ordered a CPAP titration for her based on a recent referral by Shelda Pal, D.O.The sleep study was ordered based on a baseline polysomnography performed at the [facility] in December 2017, which had diagnosed the patient with sleep apnea, hypopnea at an AHI of 21 per hour, moderate hypoxemia of sleep, mean  saturation was only 87% SPO2 raising the question of an underlying cardiopulmonary disease but probably hypoventilation due to obesity. The patient was titrated to 8 cm water pressure the AHI was 0.9 per hour, she did not produce any periodic limb movements oxygen nadir rose to 85% of the time spent at 88% saturation or below equal at only 25 minutes. She was fitted with a nasal pillow while using a mouthguard.  As the pleasure to look at the patient's compliance record today, which is excellent she has used to machine 28 out of 30 days with a 77% compliance but hours. 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 Average user time is 5 hours 21 minutes, residual apnea index is 0.3 the 95th percentile pressure has been met, she does not have air leaks. I would like for her to continue with the current machine and settings as well as the interface. The patient also reported that her hepatologist at [facility] voiced approval of CPAP therapy for all patients with fatty liver disease. I hope that the CPAP will help her to reduce body weight, promote sleep before midnight allowing her to have more balanced insulin household, and continues to allow her be less daytime sleepy. She endorsed today the Epworth sleepiness score at only 5 points and fatigue severity score was 36 points. She has visibly lost weight,  her face and neck are smaller and I adjusted the measurements in my physical exam note. I will order a ONO for her  After she mentioned that her dentist felt continuous clenching represents hypoxemia- or a reaction to low oxygen.   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 She was exhausted. In the meantime she has made attempts to lose some weight, she joined tae kwon do classes, and has lost another 14 pounds since joining the gym in October.  She is also with a new primary care physician [name]. Until recently she was followed by [name], whose office is now affiliated with the [facility] system and out of network. She is taking metformin 1000 g twice a day right now, still considered prediabetic ( ?).  Her after meal the glucometer readings have been in normal range but her fasting morning glucose is elevated constantly. She reports measuring 130s 140s.  She was also diagnosed with a fatty liver, non-hepatitic. She has joined [facility], a biopsy was recently scheduled. Negative fibrosis scan.  She also has chronic lower extremity edema, monopolar depressive disorder, circadian rhythm sleep disorder, diabetes, Baker's cyst and ganglion of the left knee, GERD, anticardiolipin syndrome, hepatic steatosis, hyperlipidemia, hypertension, hypokalemia, hypomagnesemia, sacroiliitis. She was admitted with sepsis in September 2017, was very sick and developed supraventricular tachycardia in the setting as well as a brief run of atrial fibrillation. Her follow-up cardiac studies returned normal. There is a concern that the patient with his morning glucose levels lacks slow wave sleep or delta sleep, thereby is not generating enough insulin or human growth hormone. [name] referred her to [name] for sleep study AHI during REM sleep was 26.1 per hour supine AHI 81.2 per hour REM latency was very long 273 minutes. Sleep efficiency was 89% oxygen nadir 77% mean oxygen saturation was 87.8. No PLMS noted.  Overall AHI was 21.0 per hour. Given these data are not strongly recommend CPAP titration.  She got no results of this sleep study, and no appointment  Within 3 or 4 weeks after sleep test, she called our office to have an outside sleep study interpreted and explained. Sleep habits are as follows: bedtime is 11 PM , asleep by 11.  30 minutes - sleeping through for 6 hours, and when not  working ,may have fragmented , dream rich  sleep until 10 AM. Sleep in a cool, quiet and dark room, alone. One pillow, sleeping on the side. Neck hurts, uses special pillows. Has a stuffy nose, uses afrin. . Social history:   Single, mother on BiPAP-  Never used CPAP. Midwife, losing weight, going to the gym. No tobacco use , no ETOH use. Low carb. Caffeine - 1 cup a day. 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    Review of Systems: Out of a complete 14 system review, the patient complains of only the following symptoms, and all other reviewed systems are negative.  Given that she has a history of rhinitis and stuffy nose I would like for her to have heated humidification and try and nasal mask, nasal pillow and full face mask for comfort. She should feel free to choose her interface. She endorsed the Epworth at  5 from 8, fatigue severity at 36 points.   Social History   Socioeconomic History   Marital status: Divorced    Spouse name: Not on file   Number of children: 1   Years of education: Not on file   Highest education level: Not on file  Occupational History   Occupation: [employer]    Employer: [employer]   Financial resource strain: Not on file   Food insecurity:    Worry: Not on file    Inability: Not on file   Transportation needs:    Medical: Not on file    Non-medical: Not on file  Tobacco Use   Smoking status: Never Smoker   Smokeless tobacco: Never Used  Substance and Sexual Activity   Alcohol use: Yes    Alcohol/week: 0.0 standard drinks    Comment: social   Drug use: No   Sexual activity: Not on file  Lifestyle   Physical activity:    Days per week: Not on file    Minutes per session: Not on file   Stress: Not on file  Relationships   Social connections:    Talks on phone: Not on file    Gets together: Not on file    Attends religious service: Not on  file    Active member of club or organization: Not on file    Attends meetings of clubs or organizations: Not on file    Relationship status: Not on file   Intimate partner violence:    Fear of current or ex partner: Not on file    Emotionally abused: Not on file    Physically abused: Not on file    Forced sexual activity: Not on file  Other Topics Concern   Not on file  Social History Narrative   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    Family History  Problem Relation Age of Onset   Heart attack Father    Heart disease Father    Coronary artery disease Mother    Hypertension Mother    Heart disease Mother    Heart attack Mother    Diabetes Mother    Hyperlipidemia Mother    Kidney disease Mother    Depression Mother    Sleep apnea Mother    Obesity Mother    Hypertension Sister    Hypertension Brother    Colon cancer Neg Hx     Past Medical History:  Diagnosis Date   ADD (attention deficit disorder)    Adenomyomatosis of gallbladder    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[place])    Bilateral lower extremity edema    Cervical intraepithelial neoplasia grade 1 03/17/2017   Chronic ankle edema    Chronic depressive disorder    Circadian rhythm sleep disorder, shift work    DDD (degenerative disc disease), 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   Diabetes ([place])    Diverticulosis    Minimal   E. coli urinary tract infection 05/2018   Eardrum rupture, left 05/2018   Fatty liver disease, nonalcoholic    Female bladder prolapse 03/17/2017   First degree heart block by electrocardiogram 11/05/2017   GERD (gastroesophageal reflux disease)    Glucose intolerance (impaired glucose tolerance)    Hematuria 2014   Hepatic steatosis    History of hiatal hernia 2017   Small   History of kidney stones    History of ovarian cyst 10/1999   Right, ruptured   HLA B27 (HLA B27 positive)    HLA-B27  positive 03/29/2018   Hx of pancreatitis     Hydrosalpinx 10/1999   Right   Hyperlipidemia    Hyperopia with astigmatism and presbyopia, bilateral 10/27/2017   Hypertension    Hypoxemia    uses CPAP at night   IBS (irritable bowel syndrome)    Ischemic colitis ([place])    Keratoconjunctivitis sicca of both eyes not specified as Sjogren's 10/27/2017   Kyphosis    Lateral epicondylitis of right elbow    Leiomyoma 2008   Retroflexed retroverted uterus containing a single leiomyoma 1.7 cm greatest   LGSIL (low grade squamous intraepithelial dysplasia) 08/05/2015   Liver lesion 1999   Focal nodular hyperplasia   Lumbar scoliosis 2010   Mild   Migraine headache    per menopause   Migraine headache without aura 05/10/2015   Nephrolithiasis 2014   Left, minimal   Obese    Osteoarthritis    PAC (premature atrial contraction)    Paronychia    Paroxysmal atrial fibrillation (HCC)    PMS (premenstrual syndrome)    PONV (postoperative nausea and vomiting)    had no vomitting with anesthesia from women's   Pyelonephritis 01/2016   Pyelonephritis 01/2016   Right bundle branch block    Sacroiliitis ([place])    Seasonal allergies    Sepsis ([place]) 01/2016   SVT (supraventricular tachycardia) ([place])    a. 01/2016 - SVT, wide complex tachycardia, and brief afib in setting of severe sepsis.   Thrombocytopenia ([place]) 01/2016   during sepsis   Trigger ring finger of left hand    UTI (urinary tract infection) 2014   Vitamin D deficiency     Past Surgical History:  Procedure Laterality Date   BLADDER SURGERY     BREAST BIOPSY Left    CERVICAL CONE BIOPSY     CHOLECYSTECTOMY N/A 10/26/2014   Procedure: LAPAROSCOPIC CHOLECYSTECTOMY;  Surgeon: Coralie Keens, MD;  Location: [place];  Service: General;  Laterality: N/A;   COLONOSCOPY W/ BIOPSIES  multiple   CYSTOSCOPY WITH RETROGRADE PYELOGRAM, URETEROSCOPY AND STENT PLACEMENT Left 06/26/2018   Procedure: CYSTOSCOPY WITH RETROGRADE  PYELOGRAM, URETEROSCOPY AND STENT PLACEMENT;  Surgeon: Alexis Frock, MD;  Location: [facility];  Service: Urology;  Laterality: Left;   DIAGNOSTIC LAPAROSCOPY     DILATION AND CURETTAGE OF UTERUS  07,11   DUPUYTREN CONTRACTURE RELEASE     ESOPHAGOGASTRODUODENOSCOPY  multiple   EYE SURGERY     HOLMIUM LASER APPLICATION Left 07/22/5972   Procedure: HOLMIUM LASER APPLICATION;  Surgeon: Alexis Frock, MD;  [facility];  Service: Urology;  Laterality: Left;   KNEE ARTHROSCOPY Left 11/2008   LAPAROSCOPIC VAGINAL HYSTERECTOMY WITH SALPINGO OOPHORECTOMY Bilateral 10/03/2015   Procedure: LAPAROSCOPIC ASSISTED VAGINAL HYSTERECTOMY WITH SALPINGECTOMY;  Surgeon: Eldred Manges, MD;  Location: [facility];  Service: Gynecology;  Laterality: Bilateral;   LASIK  1990   RK   LEFT FINGER SURGERY     LIVER BIOPSY     MASS EXCISION Left 12/24/2012   Procedure: LEFT EXCISION OF PALMAR MASS X TWO;  Surgeon: Roseanne Kaufman, MD;  Location: [place];  Service: Orthopedics;  Laterality: Left;   PHOTOREFRACTIVE KERATOTOMY  1994    Current Outpatient Medications  Medication Sig Dispense Refill   ALPRAZolam (XANAX) 0.5 MG tablet Take 1 tablet (0.5 mg total) by mouth at bedtime as needed for anxiety. 20 tablet 0   aspirin 325 MG tablet Take 325 mg by mouth daily.  Blood Glucose Monitoring Suppl (FREESTYLE LITE) DEVI Check blood sugar 2-3 times per day.  Dx:E11.21 1 each 0   buPROPion (WELLBUTRIN XL) 150 MG 24 hr tablet Take 1 tablet (150 mg total) by mouth daily. 90 tablet 3   cholecalciferol (VITAMIN D) 1000 UNITS tablet Take 1,000 Units by mouth daily.     cyclobenzaprine (FLEXERIL) 10 MG tablet Take 1 tablet (10 mg total) by mouth 3 (three) times daily as needed for muscle spasms. 20 tablet 0   diclofenac sodium (VOLTAREN) 1 % GEL APPLY 2 GRAMS TOPICALLY 2 TIMES DAILY AS NEEDED. NOTE: 2 GRAMS IS EQUIVALENT TO A NICKEL SIZED AMOUNT.       fluconazole (DIFLUCAN) 150 MG tablet Take one tablet if no improvement retreat in 3 days 2 tablet 0   glucose blood (FREESTYLE LITE) test strip Check blood sugar 2-3 times per day.  Dx:E11.21. 100 each 12   ketorolac (TORADOL) 10 MG tablet Take 1 tablet (10 mg total) by mouth every 8 (eight) hours as needed for moderate pain. Or stent discomfort post-operatively 20 tablet 0   Lancets (FREESTYLE) lancets Check blood sugar 2-3 times per day.  Dx:E11.21 100 each 12   losartan (COZAAR) 50 MG tablet TAKE 1 TABLET (50 MG TOTAL) BY MOUTH DAILY. 30 tablet 5   metFORMIN (GLUCOPHAGE-XR) 750 MG 24 hr tablet TAKE TWO TABLETS BY MOUTH DAILY BEFORE BREAKFAST 180 tablet 2   metoprolol succinate (TOPROL XL) 25 MG 24 hr tablet Take 1 tablet (25 mg total) by mouth daily. 30 tablet 3   metoprolol succinate (TOPROL-XL) 50 MG 24 hr tablet Take 1 tablet (50 mg total) by mouth daily. Take with or immediately following a meal. 90 tablet 2   Misc. Devices (SCD SOFT SLEEVES/KNEE LENGTH) MISC To be used at home. 2 each 1   nystatin (MYCOSTATIN) 100000 UNIT/ML suspension As directed 4 times a day 60 mL 0   Omega-3 1000 MG CAPS Take 1 capsule by mouth daily.      saccharomyces boulardii (FLORASTOR) 250 MG capsule Take 1 capsule (250 mg total) by mouth 2 (two) times daily. 60 capsule 0   senna-docusate (SENOKOT-S) 8.6-50 MG tablet Take 1 tablet by mouth 2 (two) times daily. While taking stronger pain meds to prevent contipation. 20 tablet 0   simvastatin (ZOCOR) 40 MG tablet TAKE 1 TABLET (40 MG TOTAL) BY MOUTH EVERY EVENING. (Patient taking differently: Take 40 mg by mouth every morning. ) 30 tablet 5   traMADol (ULTRAM) 50 MG tablet Take 1-2 tablets (50-100 mg total) by mouth every 6 (six) hours as needed for severe pain. Post-operatively 20 tablet 0   Turmeric 450 MG CAPS Take 900 mg by mouth daily.     No current facility-administered medications for this visit.     Allergies as of 08/05/2018 - Review  Complete 08/05/2018  Allergen Reaction Noted   Ace inhibitors Other (See Comments) 09/05/2015   Oxycodone-acetaminophen Nausea And Vomiting 09/19/2011   Vicodin [hydrocodone-acetaminophen] Nausea And Vomiting 09/19/2011   Victoza [liraglutide] Other (See Comments) 09/01/2014   Adhesive [tape] Rash 06/18/2018    Vitals: BP (!) 132/98    Pulse 62    Temp 97.9 F (36.6 C)    Ht 5' 6" (1.676 m)    Wt 239 lb (108.4 kg)    LMP 05/20/2008    BMI 38.58 kg/m  Last Weight:  Wt Readings from Last 1 Encounters:  08/05/18 239 lb (108.4 kg)   FOY:DXAJ mass index is 38.58 kg/m.  Last Height:   Ht Readings from Last 1 Encounters:  08/05/18 5' 6" (1.676 m)    Physical exam:  General: The patient is awake, alert and appears not in acute distress. The patient is well groomed. Head: Normocephalic, atraumatic. Neck is supple. Mallampati 4  neck circumference: 15.25 . Nasal airflow restricted, TMJ is evident. Retrognathia is not seen.  Bruxism marks ! . Still wears mouth guard, which fits unde nasal pillows/ nasal  ask.  Cardiovascular:  Regular rate and rhythm, 53 bpm.  Respiratory: Lungs are clear to auscultation. Skin:  Ankle edema, moderate.Trunk: BMI is 38.9 kg/m2.  The patient's posture is erect-   Neurologic exam :The patient is awake and alert, oriented to place and time.  Mood and affect are appropriate, she is happy. Cranial nerves:Pupils are equal and briskly reactive to light. Extraocular movements  in vertical and horizontal planes intact and without nystagmus. Visual fields by finger perimetry are intact.Hearing to finger rub intact. Facial sensation intact to fine touch. Facial motor strength is symmetric and tongue and uvula move midline. Tongue protrusion into cheeks is strong.  Shoulder shrug was symmetrical.   The patient was advised of the nature of the diagnosed sleep disorder, the treatment options and risks for general a health and wellness arising from not treating the  condition.  I spent more than 25  minutes of face to face time with the patient.  Greater than 50% of time was spent in counseling and coordination of care. We have discussed the diagnosis and differential and I answered the patient's questions.     Assessment:  After physical and neurologic examination, review of laboratory studies,  Personal review of imaging studies, reports of other /same  Imaging studies ,  Results of polysomnography/ neurophysiology testing and pre-existing records as far as provided in visit., my assessment is   1) OSA- Patients with REM dependent sleep apnea also do not benefit from ENT surgery. She uses CPAP at 8 cm with 3 cm EPR , highly compliant . Nasal mask better than nasal pillow. (ONO was negative in 2019) . Dental device did not help as much.  2) Xanax prescribed by PCP Dr Juleen China. She mentioned use for PTSD ( ?).    3) Wellbutrin used for appetite control- compulsive eating reduction.  Obesity She lost 40 pounds since October 2017 .  Dr Dennard Nip follows her for weight loss.  impaired fasting glucose, fatty liver, morbid obesity. Dr Orland Dec follows her at [facility]. These conditions are also promoted by shift work and poor sleep quality , too.   4) shift work Pharmacologist,  This is her second sleep disorder. Wellbutrin XR seems not to hinder her to go to sleep.  She will start a night shift rotation 2 days and 2 nights alternating.     Rv in 12 month with me.   Asencion Partridge Jacai Kipp MD  08/05/2018   CC: Dennard Nip, MD         Briscoe Deutscher, DO, MS.  [place].

## 2018-08-11 ENCOUNTER — Encounter: Payer: Self-pay | Admitting: Family Medicine

## 2018-08-15 ENCOUNTER — Encounter: Payer: Self-pay | Admitting: Family Medicine

## 2018-08-16 ENCOUNTER — Other Ambulatory Visit: Payer: Self-pay

## 2018-08-16 DIAGNOSIS — R300 Dysuria: Secondary | ICD-10-CM

## 2018-08-18 ENCOUNTER — Other Ambulatory Visit (INDEPENDENT_AMBULATORY_CARE_PROVIDER_SITE_OTHER): Payer: 59

## 2018-08-18 ENCOUNTER — Other Ambulatory Visit: Payer: Self-pay

## 2018-08-18 DIAGNOSIS — R300 Dysuria: Secondary | ICD-10-CM

## 2018-08-18 LAB — URINALYSIS, ROUTINE W REFLEX MICROSCOPIC
Bilirubin Urine: NEGATIVE
Hgb urine dipstick: NEGATIVE
Leukocytes,Ua: NEGATIVE
Nitrite: POSITIVE — AB
RBC / HPF: NONE SEEN (ref 0–?)
Specific Gravity, Urine: >=1.03 — AB (ref 1.000–1.030)
Total Protein, Urine: NEGATIVE
Urine Glucose: NEGATIVE
Urobilinogen, UA: 0.2 (ref 0.0–1.0)
pH: 5.5 (ref 5.0–8.0)

## 2018-08-19 ENCOUNTER — Other Ambulatory Visit: Payer: Self-pay

## 2018-08-19 LAB — URINE CULTURE
MICRO NUMBER:: 365913
SPECIMEN QUALITY:: ADEQUATE

## 2018-08-19 MED ORDER — SULFAMETHOXAZOLE-TRIMETHOPRIM 800-160 MG PO TABS: 1.0000 | ORAL_TABLET | Freq: Two times a day (BID) | ORAL | 0 refills | Status: DC

## 2018-08-19 MED ORDER — FLUCONAZOLE 150 MG PO TABS: ORAL_TABLET | ORAL | 0 refills | Status: DC

## 2018-08-19 MED FILL — FLUCONAZOLE 150 MG TABS: 150 | 1 days supply | Qty: 1 | Fill #0

## 2018-08-19 MED FILL — SULFAMETHOXAZOLE-TMP DS TAB: 800-160 | 5 days supply | Qty: 10 | Fill #0

## 2018-08-19 NOTE — Progress Notes (Signed)
ba

## 2018-08-29 MED FILL — METOPROLOL SUCCINATE ER 25: 25 | 90 days supply | Qty: 90 | Fill #2

## 2018-09-30 DIAGNOSIS — K760 Fatty (change of) liver, not elsewhere classified: Secondary | ICD-10-CM | POA: Diagnosis not present

## 2018-10-05 ENCOUNTER — Encounter: Payer: Self-pay | Admitting: Family Medicine

## 2018-10-14 ENCOUNTER — Other Ambulatory Visit: Payer: Self-pay | Admitting: Family Medicine

## 2018-10-14 MED FILL — metFORMIN HCL ER 750 MG TB2: 750 | 90 days supply | Qty: 180 | Fill #2

## 2018-10-14 MED FILL — LOSARTAN POTASSIUM 50 MG TA: 50 | 90 days supply | Qty: 90 | Fill #0

## 2018-10-20 ENCOUNTER — Other Ambulatory Visit: Payer: Self-pay

## 2018-10-20 ENCOUNTER — Encounter: Payer: Self-pay | Admitting: Family Medicine

## 2018-10-20 ENCOUNTER — Ambulatory Visit (INDEPENDENT_AMBULATORY_CARE_PROVIDER_SITE_OTHER): Payer: 59 | Admitting: Family Medicine

## 2018-10-20 VITALS — Ht 66.0 in | Wt 235.0 lb

## 2018-10-20 DIAGNOSIS — R5383 Other fatigue: Secondary | ICD-10-CM | POA: Diagnosis not present

## 2018-10-20 DIAGNOSIS — E119 Type 2 diabetes mellitus without complications: Secondary | ICD-10-CM | POA: Diagnosis not present

## 2018-10-20 DIAGNOSIS — Z79899 Other long term (current) drug therapy: Secondary | ICD-10-CM | POA: Diagnosis not present

## 2018-10-20 DIAGNOSIS — F902 Attention-deficit hyperactivity disorder, combined type: Secondary | ICD-10-CM

## 2018-10-20 DIAGNOSIS — E559 Vitamin D deficiency, unspecified: Secondary | ICD-10-CM

## 2018-10-20 MED ORDER — DULAGLUTIDE 0.75 MG/0.5ML ~~LOC~~ SOAJ: SUBCUTANEOUS | 3 refills | Status: DC

## 2018-10-20 MED ORDER — ONDANSETRON 4 MG PO TBDP: 4.0000 mg | ORAL_TABLET | Freq: Three times a day (TID) | ORAL | 0 refills | Status: DC | PRN

## 2018-10-20 MED FILL — TRULICITY 0.75 MG/0.5 ML PE: 0.75 | 28 days supply | Qty: 2 | Fill #0

## 2018-10-20 MED FILL — ONDANSETRON ODT 4 MG TABLET: 4 | 6 days supply | Qty: 20 | Fill #0

## 2018-10-20 NOTE — Progress Notes (Signed)
Virtual Visit via Video   Due to the COVID-19 pandemic, this visit was completed with telemedicine (audio/video) technology to reduce patient and provider exposure as well as to preserve personal protective equipment.   I connected with Alexandra Walker by a video enabled telemedicine application and verified that I am speaking with the correct person using two identifiers. Location patient: Home Location provider: [facility], Office Persons participating in the virtual visit: Pearson, Reasons, DO Lonell Grandchild, CMA acting as scribe for Dr. Briscoe Deutscher.   I discussed the limitations of evaluation and management by telemedicine and the availability of in person appointments. The patient expressed understanding and agreed to proceed.  Care Team   Patient Care Team: Briscoe Deutscher, DO as PCP - General (Family Medicine) Burnell Blanks, MD as PCP - Cardiology (Cardiology)  Subjective:   HPI: Per phone message:  Was feeling like Wellbutrin is not helping me w cravings. And I still get some anxiety at night. Have been eating sugar and carbs too much. Not super anxious just restless and hungry. Haven't been on treadmill but hope to restart this week.  Review of Systems  Constitutional: Negative for chills and fever.  HENT: Negative for hearing loss and tinnitus.   Eyes: Negative for blurred vision and double vision.  Cardiovascular: Negative for chest pain and palpitations.  Gastrointestinal: Negative for nausea and vomiting.  Genitourinary: Negative for dysuria and urgency.  Musculoskeletal: Negative for myalgias.  Neurological: Negative for dizziness and headaches.  Psychiatric/Behavioral: Negative for depression and suicidal ideas.    Patient Active Problem List   Diagnosis Date Noted  . Diverticulosis of colon 05/16/2018  . Drug-induced pancreatitis, 2015, thought to be due to [facility] 03/29/2018  . History of hysterectomy in 2017 03/29/2018  .  Left kidney, 12 mm nonobstructing calculus, on [name] 2017  03/29/2018  . History of vitreous detachment OU, HTN retinopathy OU, High myopia OU, Cataracts OU, Vitreous floaters OU, Keratoconjunctivitis sicca OU not specified as Sjogren's 03/29/2018  . History of cholecystectomy 03/29/2018  . 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[place])   . Attention deficit hyperactivity disorder (ADHD), combined type, tolerates low-dose Vyvanse 10/19/2017  . Recurrent circadian rhythm sleep disorder, shift work type 03/31/2017  . Morbid obesity ([place]) 03/31/2017  . IBS (irritable bowel syndrome), diarrhea predominant 03/17/2017  . Nonalcoholic fatty liver disease, followed by [facility] 09/26/2016  . OSA on CPAP, followed by Neurology/Sleep 09/26/2016  . Afib and SVT during an episode of sepsis secondary to pyelonephritis in October 2017 requiring hospitalization 02/01/2016  . Hypertension associated with diabetes ([place]), on Losartan and Metoprolol 01/31/2016  . Type 2 diabetes mellitus without complication, without long-term current use of insulin ([place]), on Metformin 01/31/2016  . Hyperlipidemia associated with type 2 diabetes mellitus ([place]), on Zocor 05/10/2015  . Vitamin D deficiency, on Vit D 2000 IU daily 05/10/2015  . PAC (premature atrial contraction), controlled with Metoprolol 50 mg daily - failed 25 mg daily 09/19/2011    Social History   Tobacco Use  . Smoking status: Never Smoker  . Smokeless tobacco: Never Used  Substance Use Topics  . Alcohol use: Yes    Alcohol/week: 0.0 standard drinks    Comment: social    Current Outpatient Medications:  .  ALPRAZolam (XANAX) 0.5 MG tablet, Take 1 tablet (0.5 mg total) by mouth at bedtime as needed for anxiety., Disp: 20 tablet, Rfl: 0 .  aspirin 325 MG tablet, Take 325 mg by mouth daily., Disp: , Rfl:  .  Blood Glucose Monitoring Suppl (FREESTYLE LITE) DEVI, Check blood sugar 2-3 times per day.  Dx:E11.21, Disp: 1 each, Rfl: 0 .  buPROPion (WELLBUTRIN XL) 150  MG 24 hr tablet, Take 1 tablet (150 mg total) by mouth daily., Disp: 90 tablet, Rfl: 3 .  cholecalciferol (VITAMIN D) 1000 UNITS tablet, Take 1,000 Units by mouth daily., Disp: , Rfl:  .  cyclobenzaprine (FLEXERIL) 10 MG tablet, Take 1 tablet (10 mg total) by mouth 3 (three) times daily as needed for muscle spasms., Disp: 20 tablet, Rfl: 0 .  diclofenac sodium (VOLTAREN) 1 % GEL, APPLY 2 GRAMS TOPICALLY 2 TIMES DAILY AS NEEDED. NOTE: 2 GRAMS IS EQUIVALENT TO A NICKEL SIZED AMOUNT., Disp: , Rfl:  .  fluconazole (DIFLUCAN) 150 MG tablet, Take one tablet if no improvement retreat in 3 days, Disp: 2 tablet, Rfl: 0 .  fluconazole (DIFLUCAN) 150 MG tablet, Take one tab and retreat in three days if still symptoms., Disp: 1 tablet, Rfl: 0 .  glucose blood (FREESTYLE LITE) test strip, Check blood sugar 2-3 times per day.  Dx:E11.21., Disp: 100 each, Rfl: 12 .  Lancets (FREESTYLE) lancets, Check blood sugar 2-3 times per day.  Dx:E11.21, Disp: 100 each, Rfl: 12 .  losartan (COZAAR) 50 MG tablet, TAKE 1 TABLET (50 MG TOTAL) BY MOUTH DAILY., Disp: 30 tablet, Rfl: 5 .  metFORMIN (GLUCOPHAGE-XR) 750 MG 24 hr tablet, TAKE TWO TABLETS BY MOUTH DAILY BEFORE BREAKFAST, Disp: 180 tablet, Rfl: 2 .  metoprolol succinate (TOPROL XL) 25 MG 24 hr tablet, Take 1 tablet (25 mg total) by mouth daily., Disp: 30 tablet, Rfl: 3 .  metoprolol succinate (TOPROL-XL) 50 MG 24 hr tablet, Take 1 tablet (50 mg total) by mouth daily. Take with or immediately following a meal., Disp: 90 tablet, Rfl: 2 .  Misc. Devices (SCD SOFT SLEEVES/KNEE LENGTH) MISC, To be used at home., Disp: 2 each, Rfl: 1 .  Omega-3 1000 MG CAPS, Take 1 capsule by mouth daily. , Disp: , Rfl:  .  saccharomyces boulardii (FLORASTOR) 250 MG capsule, Take 1 capsule (250 mg total) by mouth 2 (two) times daily., Disp: 60 capsule, Rfl: 0 .  simvastatin (ZOCOR) 40 MG tablet, TAKE 1 TABLET (40 MG TOTAL) BY MOUTH EVERY EVENING. (Patient taking differently: Take 40 mg by mouth  every morning. ), Disp: 30 tablet, Rfl: 5 .  sulfamethoxazole-trimethoprim (BACTRIM DS) 800-160 MG tablet, Take 1 tablet by mouth 2 (two) times daily., Disp: 10 tablet, Rfl: 0 .  Turmeric 450 MG CAPS, Take 900 mg by mouth daily., Disp: , Rfl:   Allergies  Allergen Reactions  . Ace Inhibitors Other (See Comments)    coughing  . Oxycodone-Acetaminophen Nausea And Vomiting  . Vicodin [Hydrocodone-Acetaminophen] Nausea And Vomiting  . Victoza [Liraglutide] Other (See Comments)    pancreatitis  . Adhesive [Tape] Rash    Not on stomach    Objective:   VITALS: Per patient if applicable, see vitals. GENERAL: Alert, appears well and in no acute distress. HEENT: Atraumatic, conjunctiva clear, no obvious abnormalities on inspection of external nose and ears. NECK: Normal movements of the head and neck. CARDIOPULMONARY: No increased WOB. Speaking in clear sentences. I:E ratio WNL.  MS: Moves all visible extremities without noticeable abnormality. PSYCH: Pleasant and cooperative, well-groomed. Speech normal rate and rhythm. Affect is appropriate. Insight and judgement are appropriate. Attention is focused, linear, and appropriate.  NEURO: CN grossly intact. Oriented as arrived to appointment on time with no prompting. Moves both UE equally.  SKIN: No obvious lesions, wounds, erythema, or cyanosis noted on face or hands.  Depression screen PHQ 2/9 10/28/2016  Decreased Interest 1  Down, Depressed, Hopeless 3  PHQ - 2 Score 4  Altered sleeping 2  Tired, decreased energy 2  Change in appetite 2  Feeling bad or failure about yourself  2  Trouble concentrating 3  Moving slowly or fidgety/restless 0  Suicidal thoughts 0  PHQ-9 Score 15    Assessment and Plan:   [place] was seen today for follow-up.  Diagnoses and all orders for this visit:  Vitamin D deficiency -     VITAMIN D 25 Hydroxy (Vit-D Deficiency, Fractures); Future  Other fatigue  Attention deficit hyperactivity disorder  (ADHD), combined type, tolerates low-dose Vyvanse  Type 2 diabetes mellitus without complication, without long-term current use of insulin ([place]), on Metformin -     Dulaglutide (TRULICITY) 7.06 CB/7.6EG SOPN; 0.75 mg [place] q wk x 2 wks , then increase to 1.5 mg [place] if tolerating -     ondansetron (ZOFRAN ODT) 4 MG disintegrating tablet; Take 1 tablet (4 mg total) by mouth every 8 (eight) hours as needed for nausea or vomiting. -     Hemoglobin A1c; Future  Medication management -     CBC with Differential/Platelet; Future -     Comprehensive metabolic panel; Future -     Hemoglobin A1c; Future -     Lipase; Future    . COVID-19 Education: The signs and symptoms of COVID-19 were discussed with the patient and how to seek care for testing if needed. The importance of social distancing was discussed today. . Reviewed expectations re: course of current medical issues. . Discussed self-management of symptoms. . Outlined signs and symptoms indicating need for more acute intervention. . Patient verbalized understanding and all questions were answered. Marland Kitchen Health Maintenance issues including appropriate healthy diet, exercise, and smoking avoidance were discussed with patient. . See orders for this visit as documented in the electronic medical record.  Briscoe Deutscher, DO  Records requested if needed. Time spent: 25 minutes, of which >50% was spent in obtaining information about her symptoms, reviewing her previous labs, evaluations, and treatments, counseling her about her condition (please see the discussed topics above), and developing a plan to further investigate it; she had a number of questions which I addressed.

## 2018-10-22 ENCOUNTER — Other Ambulatory Visit (INDEPENDENT_AMBULATORY_CARE_PROVIDER_SITE_OTHER): Payer: 59

## 2018-10-22 ENCOUNTER — Other Ambulatory Visit: Payer: Self-pay

## 2018-10-22 DIAGNOSIS — R509 Fever, unspecified: Secondary | ICD-10-CM | POA: Diagnosis not present

## 2018-10-22 DIAGNOSIS — N119 Chronic tubulo-interstitial nephritis, unspecified: Secondary | ICD-10-CM | POA: Diagnosis not present

## 2018-10-22 DIAGNOSIS — R5383 Other fatigue: Secondary | ICD-10-CM

## 2018-10-22 DIAGNOSIS — E559 Vitamin D deficiency, unspecified: Secondary | ICD-10-CM

## 2018-10-22 LAB — HEMOGLOBIN A1C: Hgb A1c MFr Bld: 5.9 % (ref 4.6–6.5)

## 2018-10-22 LAB — COMPREHENSIVE METABOLIC PANEL
ALT: 14 U/L (ref 0–35)
AST: 13 U/L (ref 0–37)
Albumin: 4 g/dL (ref 3.5–5.2)
Alkaline Phosphatase: 71 U/L (ref 39–117)
BUN: 22 mg/dL (ref 6–23)
CO2: 25 mEq/L (ref 19–32)
Calcium: 9.2 mg/dL (ref 8.4–10.5)
Chloride: 102 mEq/L (ref 96–112)
Creatinine, Ser: 0.66 mg/dL (ref 0.40–1.20)
GFR: 91.16 mL/min (ref >60.00)
Glucose, Bld: 99 mg/dL (ref 70–99)
Potassium: 4.1 mEq/L (ref 3.5–5.1)
Sodium: 135 mEq/L (ref 135–145)
Total Bilirubin: 0.4 mg/dL (ref 0.2–1.2)
Total Protein: 6.4 g/dL (ref 6.0–8.3)

## 2018-10-22 LAB — CBC WITH DIFFERENTIAL/PLATELET
Basophils Absolute: 0 10*3/uL (ref 0.0–0.1)
Basophils Relative: 0.8 % (ref 0.0–3.0)
Eosinophils Absolute: 0.2 10*3/uL (ref 0.0–0.7)
Eosinophils Relative: 2.8 % (ref 0.0–5.0)
HCT: 39.1 % (ref 36.0–46.0)
Hemoglobin: 12.9 g/dL (ref 12.0–15.0)
Lymphocytes Relative: 29.3 % (ref 12.0–46.0)
Lymphs Abs: 1.7 10*3/uL (ref 0.7–4.0)
MCHC: 33.1 g/dL (ref 30.0–36.0)
MCV: 85.1 fl (ref 78.0–100.0)
Monocytes Absolute: 0.4 10*3/uL (ref 0.1–1.0)
Monocytes Relative: 7.7 % (ref 3.0–12.0)
Neutro Abs: 3.4 10*3/uL (ref 1.4–7.7)
Neutrophils Relative %: 59.4 % (ref 43.0–77.0)
Platelets: 241 10*3/uL (ref 150.0–400.0)
RBC: 4.59 Mil/uL (ref 3.87–5.11)
RDW: 13.7 % (ref 11.5–15.5)
WBC: 5.7 10*3/uL (ref 4.0–10.5)

## 2018-10-22 LAB — LIPASE: Lipase: 44 U/L (ref 11.0–59.0)

## 2018-10-22 LAB — VITAMIN D 25 HYDROXY (VIT D DEFICIENCY, FRACTURES): VITD: 44.27 ng/mL (ref 30.00–100.00)

## 2018-10-24 NOTE — Progress Notes (Signed)
Subjective:    Alexandra Walker is a 63 y.o. female and is here for a comprehensive physical exam.  Within the past 3 months... Rarely Sometimes Often Always  During the past 3 months, have you had any episodes of excessive overeating?  x    Do you feel distressed about your excessive overeating?    x  During your episodes of excessive overeating, how often did you feel like you had no control over your eating? x     During your episodes of excessive eating, how often did you continue to eat even though you were not hungry?  x    During your episodes of excessive overeating, how often were you embarrassed by how much you ate?   x   During your episodes of excessive overeating, how often did you feel disgusted by yourself or guilty afterwards?   x   During the last 3 months, how often did you make yourself vomit as a means to control your weight or shape? Never       Health Maintenance Due  Topic Date Due  . TETANUS/TDAP  06/07/2017  . MAMMOGRAM  04/18/2018  . OPHTHALMOLOGY EXAM  10/03/2018   Current Outpatient Medications:  .  ALPRAZolam (XANAX) 0.5 MG tablet, Take 1 tablet (0.5 mg total) by mouth at bedtime as needed for anxiety., Disp: 20 tablet, Rfl: 0 .  aspirin 325 MG tablet, Take 325 mg by mouth daily., Disp: , Rfl:  .  Blood Glucose Monitoring Suppl (FREESTYLE LITE) DEVI, Check blood sugar 2-3 times per day.  Dx:E11.21, Disp: 1 each, Rfl: 0 .  buPROPion (WELLBUTRIN XL) 150 MG 24 hr tablet, Take 1 tablet (150 mg total) by mouth daily., Disp: 90 tablet, Rfl: 3 .  cholecalciferol (VITAMIN D) 1000 UNITS tablet, Take 1,000 Units by mouth daily., Disp: , Rfl:  .  cyclobenzaprine (FLEXERIL) 10 MG tablet, Take 1 tablet (10 mg total) by mouth 3 (three) times daily as needed for muscle spasms., Disp: 20 tablet, Rfl: 0 .  diclofenac sodium (VOLTAREN) 1 % GEL, APPLY 2 GRAMS TOPICALLY 2 TIMES DAILY AS NEEDED. NOTE: 2 GRAMS IS EQUIVALENT TO A NICKEL SIZED AMOUNT., Disp: , Rfl:  .  Dulaglutide  (TRULICITY) 7.04 UG/8.9VQ SOPN, 0.75 mg [place] q wk x 2 wks , then increase to 1.5 mg [place] if tolerating, Disp: 4 pen, Rfl: 3 .  losartan (COZAAR) 50 MG tablet, TAKE 1 TABLET (50 MG TOTAL) BY MOUTH DAILY., Disp: 30 tablet, Rfl: 5 .  metFORMIN (GLUCOPHAGE-XR) 750 MG 24 hr tablet, TAKE TWO TABLETS BY MOUTH DAILY BEFORE BREAKFAST, Disp: 180 tablet, Rfl: 2 .  metoprolol succinate (TOPROL XL) 25 MG 24 hr tablet, Take 1 tablet (25 mg total) by mouth daily., Disp: 30 tablet, Rfl: 3 .  metoprolol succinate (TOPROL-XL) 50 MG 24 hr tablet, Take 1 tablet (50 mg total) by mouth daily. Take with or immediately following a meal., Disp: 90 tablet, Rfl: 2 .  Omega-3 1000 MG CAPS, Take 1 capsule by mouth daily. , Disp: , Rfl:  .  ondansetron (ZOFRAN ODT) 4 MG disintegrating tablet, Take 1 tablet (4 mg total) by mouth every 8 (eight) hours as needed for nausea or vomiting., Disp: 20 tablet, Rfl: 0 .  saccharomyces boulardii (FLORASTOR) 250 MG capsule, Take 1 capsule (250 mg total) by mouth 2 (two) times daily., Disp: 60 capsule, Rfl: 0 .  simvastatin (ZOCOR) 40 MG tablet, TAKE 1 TABLET (40 MG TOTAL) BY MOUTH EVERY EVENING. (Patient taking  differently: Take 40 mg by mouth every morning. ), Disp: 30 tablet, Rfl: 5 .  Turmeric 450 MG CAPS, Take 900 mg by mouth daily., Disp: , Rfl:   PMHx, SurgHx, SocialHx, Medications, and Allergies were reviewed in the Visit Navigator and updated as appropriate.   Past Medical History:  Diagnosis Date  . ADD (attention deficit disorder)   . Adenomyomatosis of gallbladder   . 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[place])   . Bilateral lower extremity edema   . Cervical intraepithelial neoplasia grade 1 03/17/2017  . Chronic ankle edema   . Chronic depressive disorder   . Circadian rhythm sleep disorder, shift work   . 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 Diabetes ([place])   . Diverticulosis    Minimal  . E. coli urinary tract infection 05/2018  . Eardrum rupture, left 05/2018  .  Fatty liver disease, nonalcoholic   . Female bladder prolapse 03/17/2017  . First degree heart block by electrocardiogram 11/05/2017  . GERD (gastroesophageal reflux disease)   . Glucose intolerance (impaired glucose tolerance)   . Hematuria 2014  . Hepatic steatosis   . History of hiatal hernia 2017   Small  . History of kidney stones   . History of ovarian cyst 10/1999   Right, ruptured  . HLA B27 (HLA B27 positive)   . HLA-B27 positive 03/29/2018  . Hx of pancreatitis   . Hydrosalpinx 10/1999   Right  . Hyperlipidemia   . Hyperopia with astigmatism and presbyopia, bilateral 10/27/2017  . Hypertension   . Hypoxemia    uses CPAP at night  . IBS (irritable bowel syndrome)   . Ischemic colitis ([place])   . Keratoconjunctivitis sicca of both eyes not specified as Sjogren's 10/27/2017  . Kyphosis   . Lateral epicondylitis of right elbow   . Leiomyoma 2008   Retroflexed retroverted uterus containing a single leiomyoma 1.7 cm greatest  . LGSIL (low grade squamous intraepithelial dysplasia) 08/05/2015  . Liver lesion 1999   Focal nodular hyperplasia  . Lumbar scoliosis 2010   Mild  . Migraine headache    per menopause  . Migraine headache without aura 05/10/2015  . Nephrolithiasis 2014   Left, minimal  . Obese   . Osteoarthritis   . PAC (premature atrial contraction)   . Paronychia   . Paroxysmal atrial fibrillation (HCC)   . PMS (premenstrual syndrome)   . PONV (postoperative nausea and vomiting)    had no vomitting with anesthesia from women's  . Pyelonephritis 01/2016  . Pyelonephritis 01/2016  . Right bundle branch block   . Sacroiliitis ([place])   . Seasonal allergies   . Sepsis ([place]) 01/2016  . SVT (supraventricular tachycardia) ([place])    a. 01/2016 - SVT, wide complex tachycardia, and brief afib in setting of severe sepsis.  . Thrombocytopenia ([place]) 01/2016   during sepsis  . Trigger ring finger of left hand   . UTI (urinary tract infection) 2014  . Vitamin D  deficiency      Past Surgical History:  Procedure Laterality Date  . BLADDER SURGERY    . BREAST BIOPSY Left   . CERVICAL CONE BIOPSY    . CHOLECYSTECTOMY N/A 10/26/2014   Procedure: LAPAROSCOPIC CHOLECYSTECTOMY;  Surgeon: Coralie Keens, MD;  Location: [facility];  Service: General;  Laterality: N/A;  . COLONOSCOPY W/ BIOPSIES  multiple  . CYSTOSCOPY WITH RETROGRADE PYELOGRAM, URETEROSCOPY AND STENT PLACEMENT Left 06/26/2018   Procedure: CYSTOSCOPY WITH RETROGRADE PYELOGRAM, URETEROSCOPY AND STENT PLACEMENT;  Surgeon:  Alexis Frock, MD;  Location[facility];  Service: Urology;  Laterality: Left;  . DIAGNOSTIC LAPAROSCOPY    . DILATION AND CURETTAGE OF UTERUS  07,11  . DUPUYTREN CONTRACTURE RELEASE    . ESOPHAGOGASTRODUODENOSCOPY  multiple  . EYE SURGERY    . HOLMIUM LASER APPLICATION Left 07/22/1935   Procedure: HOLMIUM LASER APPLICATION;  Surgeon: Alexis Frock, MD;  Location: [facility];  Service: Urology;  Laterality: Left;  . KNEE ARTHROSCOPY Left 11/2008  . LAPAROSCOPIC VAGINAL HYSTERECTOMY WITH SALPINGO OOPHORECTOMY Bilateral 10/03/2015   Procedure: LAPAROSCOPIC ASSISTED VAGINAL HYSTERECTOMY WITH SALPINGECTOMY;  Surgeon: Eldred Manges, MD;  Location: [facility];  Service: Gynecology;  Laterality: Bilateral;  . LASIK  1990   RK  . LEFT FINGER SURGERY    . LIVER BIOPSY    . MASS EXCISION Left 12/24/2012   Procedure: LEFT EXCISION OF PALMAR MASS X TWO;  Surgeon: Roseanne Kaufman, MD;  Location: [facility];  Service: Orthopedics;  Laterality: Left;  . PHOTOREFRACTIVE KERATOTOMY  40     Family History  Problem Relation Age of Onset  . Heart attack Father   . Heart disease Father   . Coronary artery disease Mother   . Hypertension Mother   . Heart disease Mother   . Heart attack Mother   . Diabetes Mother   . Hyperlipidemia Mother   . Kidney disease Mother   . Depression Mother   . Sleep apnea Mother   .  Obesity Mother   . Hypertension Sister   . Hypertension Brother   . Colon cancer Neg Hx     Social History   Tobacco Use  . Smoking status: Never Smoker  . Smokeless tobacco: Never Used  Substance Use Topics  . Alcohol use: Yes    Alcohol/week: 0.0 standard drinks    Comment: social  . Drug use: No    Review of Systems:   Pertinent items are noted in the HPI. Otherwise, ROS is negative.  Objective:   BP (!) 146/88 Comment: did not take meds today  Pulse 68   Temp 98.7 F (37.1 C) (Oral)   Ht '5\' 6"'  (1.676 m)   Wt 239 lb (108.4 kg)   LMP 05/20/2008   SpO2 98%   BMI 38.58 kg/m   General appearance: alert, cooperative and appears stated age. Head: normocephalic, without obvious abnormality, atraumatic. Neck: no adenopathy, supple, symmetrical, trachea midline; thyroid not enlarged, symmetric, no tenderness/mass/nodules. Lungs: clear to auscultation bilaterally. Heart: regular rate and rhythm Abdomen: soft, non-tender; no masses,  no organomegaly. Extremities: extremities normal, atraumatic, no cyanosis or edema. Skin: skin color, texture, turgor normal, no rashes or lesions. Lymph: cervical, supraclavicular, and axillary nodes normal; no abnormal inguinal nodes palpated. Neurologic: grossly normal.  Diabetic Foot Exam - Simple   Simple Foot Form Diabetic Foot exam was performed with the following findings:  Yes 10/28/2018  9:44 AM  Visual Inspection No deformities, no ulcerations, no other skin breakdown bilaterally:  Yes Sensation Testing Intact to touch and monofilament testing bilaterally:  Yes Pulse Check Posterior Tibialis and Dorsalis pulse intact bilaterally:  Yes Comments     Assessment/Plan:   Savi was seen today for annual exam.  Diagnoses and all orders for this visit:  Routine physical examination  Hypertension associated with diabetes ([place]), on Losartan and Metoprolol -     Lipid panel  Nonalcoholic fatty liver disease, followed by [facility]  Type 2 diabetes mellitus without complication, without long-term  current use of insulin ([place]), on Trulicity  Hyperlipidemia associated with type 2 diabetes mellitus ([place]), on Zocor  Morbid obesity ([place])    Patient Counseling: '[x]'    Nutrition: Stressed importance of moderation in sodium/caffeine intake, saturated fat and cholesterol, caloric balance, sufficient intake of fresh fruits, vegetables, fiber, calcium, iron, and 1 mg of folate supplement per day (for females capable of pregnancy).  '[x]'    Stressed the importance of regular exercise.   '[x]'    Substance Abuse: Discussed cessation/primary prevention of tobacco, alcohol, or other drug use; driving or other dangerous activities under the influence; availability of treatment for abuse.   '[x]'    Injury prevention: Discussed safety belts, safety helmets, smoke detector, smoking near bedding or upholstery.   '[x]'    Sexuality: Discussed sexually transmitted diseases, partner selection, use of condoms, avoidance of unintended pregnancy  and contraceptive alternatives.  '[x]'    Dental health: Discussed importance of regular tooth brushing, flossing, and dental visits.  '[x]'    Health maintenance and immunizations reviewed. Please refer to Health maintenance section.   Briscoe Deutscher, DO [place]

## 2018-10-25 ENCOUNTER — Encounter: Payer: Self-pay | Admitting: Family Medicine

## 2018-10-26 ENCOUNTER — Ambulatory Visit (INDEPENDENT_AMBULATORY_CARE_PROVIDER_SITE_OTHER): Payer: 59 | Admitting: Family Medicine

## 2018-10-26 ENCOUNTER — Encounter: Payer: Self-pay | Admitting: Family Medicine

## 2018-10-26 ENCOUNTER — Other Ambulatory Visit: Payer: Self-pay

## 2018-10-26 VITALS — BP 146/88 | HR 68 | Temp 98.7°F | Ht 66.0 in | Wt 239.0 lb

## 2018-10-26 DIAGNOSIS — I10 Essential (primary) hypertension: Secondary | ICD-10-CM | POA: Diagnosis not present

## 2018-10-26 DIAGNOSIS — E1169 Type 2 diabetes mellitus with other specified complication: Secondary | ICD-10-CM | POA: Diagnosis not present

## 2018-10-26 DIAGNOSIS — E1159 Type 2 diabetes mellitus with other circulatory complications: Secondary | ICD-10-CM

## 2018-10-26 DIAGNOSIS — K760 Fatty (change of) liver, not elsewhere classified: Secondary | ICD-10-CM | POA: Diagnosis not present

## 2018-10-26 DIAGNOSIS — E119 Type 2 diabetes mellitus without complications: Secondary | ICD-10-CM | POA: Diagnosis not present

## 2018-10-26 DIAGNOSIS — I152 Hypertension secondary to endocrine disorders: Secondary | ICD-10-CM

## 2018-10-26 DIAGNOSIS — E785 Hyperlipidemia, unspecified: Secondary | ICD-10-CM | POA: Diagnosis not present

## 2018-10-26 DIAGNOSIS — Z0000 Encounter for general adult medical examination without abnormal findings: Secondary | ICD-10-CM | POA: Diagnosis not present

## 2018-10-26 LAB — CULTURE, BLOOD (SINGLE)

## 2018-10-27 LAB — LIPID PANEL
Cholesterol: 223 mg/dL — ABNORMAL HIGH (ref 0–200)
HDL: 48.2 mg/dL (ref >39.00)
LDL Cholesterol: 139 mg/dL — ABNORMAL HIGH (ref 0–99)
NonHDL: 175.24
Total CHOL/HDL Ratio: 5
Triglycerides: 183 mg/dL — ABNORMAL HIGH (ref 0.0–149.0)
VLDL: 36.6 mg/dL (ref 0.0–40.0)

## 2018-10-28 ENCOUNTER — Other Ambulatory Visit: Payer: Self-pay | Admitting: Family Medicine

## 2018-10-28 ENCOUNTER — Encounter: Payer: Self-pay | Admitting: Family Medicine

## 2018-10-29 MED FILL — SIMVASTATIN 40 MG TABLET: 40 | 90 days supply | Qty: 90 | Fill #0

## 2018-10-30 LAB — CULTURE BLOOD MANUAL
Micro Number: 541372
Result: NO GROWTH
Specimen Quality: ADEQUATE

## 2018-11-02 DIAGNOSIS — Z83511 Family history of glaucoma: Secondary | ICD-10-CM | POA: Diagnosis not present

## 2018-11-02 DIAGNOSIS — H524 Presbyopia: Secondary | ICD-10-CM | POA: Diagnosis not present

## 2018-11-02 DIAGNOSIS — H2513 Age-related nuclear cataract, bilateral: Secondary | ICD-10-CM | POA: Diagnosis not present

## 2018-11-02 DIAGNOSIS — Z7984 Long term (current) use of oral hypoglycemic drugs: Secondary | ICD-10-CM | POA: Diagnosis not present

## 2018-11-02 DIAGNOSIS — H5203 Hypermetropia, bilateral: Secondary | ICD-10-CM | POA: Diagnosis not present

## 2018-11-02 DIAGNOSIS — H43393 Other vitreous opacities, bilateral: Secondary | ICD-10-CM | POA: Diagnosis not present

## 2018-11-02 DIAGNOSIS — H16223 Keratoconjunctivitis sicca, not specified as Sjogren's, bilateral: Secondary | ICD-10-CM | POA: Diagnosis not present

## 2018-11-02 DIAGNOSIS — E119 Type 2 diabetes mellitus without complications: Secondary | ICD-10-CM | POA: Diagnosis not present

## 2018-11-02 DIAGNOSIS — H52203 Unspecified astigmatism, bilateral: Secondary | ICD-10-CM | POA: Diagnosis not present

## 2018-11-13 MED FILL — METOPROLOL SUCCINATE ER 50: 50 | 90 days supply | Qty: 90 | Fill #1

## 2018-11-13 MED FILL — FREESTYLE LITE TEST STRIP: 30 days supply | Qty: 100 | Fill #1

## 2018-11-16 ENCOUNTER — Encounter: Payer: Self-pay | Admitting: Family Medicine

## 2018-11-16 MED FILL — TRULICITY 0.75 MG/0.5 ML PE: 0.75 | 28 days supply | Qty: 2 | Fill #1

## 2018-11-17 ENCOUNTER — Encounter: Payer: Self-pay | Admitting: Family Medicine

## 2018-11-17 ENCOUNTER — Ambulatory Visit: Payer: Self-pay

## 2018-11-17 ENCOUNTER — Other Ambulatory Visit: Payer: Self-pay

## 2018-11-17 DIAGNOSIS — R5383 Other fatigue: Secondary | ICD-10-CM

## 2018-11-17 NOTE — Telephone Encounter (Signed)
    Alexandra Walker Female, 63 y.o., 1955-11-30 MRN:  [id number] Phone:  [phone] (M) ... PCP:  Briscoe Deutscher, DO Primary Cvg:  [employer] [employer] Next Appt With Cardiology 12/07/2018 at 9:40 AM Message from Rayann Heman sent at 11/17/2018 3:30 PM EDT  Summary: irregular heart beats    Pt called and stated that she has been having irregular heart beats and would like a call back from the nurse. Please advise         Call History   Type Contact  11/17/2018 03:29 PM EDT Phone (Incoming) [name] (Self)  Phone: [phone] (H)  User: Rayann Heman

## 2018-11-17 NOTE — Telephone Encounter (Signed)
Returned call to pt who states that she is no longer experiencing palpitations. Pt states that when she first called the office she was experiencing the palpitations for approximately 5-10 min while reclining in a chair watching tv. Pt states that she did feel lightheaded at that time as well. Pt states that when she sat up from the chair the palpitations resolved. Pt denies any dizziness or feeling lightheaded at the time of call. Pt states that she did send information from her apple watch to provider as well. Pt states she is currently on Toprol. Pt has an appt with cardiologist on 12/07/18. Pt advised to return call and to seek treatment in the ED if she feels like her heart skips a beat more than 4 times per minute, if her heart is beating very rapidly or if she feels lightheaded again. Pt verbalized understanding.   Reason for Disposition . [1] Skipped or extra beat(s) AND [2] occurs < 4 times / minute  Answer Assessment - Initial Assessment Questions 1. DESCRIPTION: "Please describe your heart rate or heart beat that you are having" (e.g., fast/slow, regular/irregular, skipped or extra beats, "palpitations")     Did feel like it skipped a beat but not present currently.  2. ONSET: "When did it start?" (Minutes, hours or days)      Is not present at this time but occurred around 3 this afternoon 3. DURATION: "How long does it last" (e.g., seconds, minutes, hours)     5-10 min 4. PATTERN "Does it come and go, or has it been constant since it started?"  "Does it get worse with exertion?"   "Are you feeling it now?"     Feels ok now, symptoms are not present 5. TAP: "Using your hand, can you tap out what you are feeling on a chair or table in front of you, so that I can hear?" (Note: not all patients can do this)       n/a 6. HEART RATE: "Can you tell me your heart rate?" "How many beats in 15 seconds?"  (Note: not all patients can do this)       n/a 7. RECURRENT SYMPTOM: "Have you ever had this  before?" If so, ask: "When was the last time?" and "What happened that time?"      Yes  8. CAUSE: "What do you think is causing the palpitations?"     Hx of palpitations, pt states she had one cup of coffee earlier today which is what she usually has in the morning.  9. CARDIAC HISTORY: "Do you have any history of heart disease?" (e.g., heart attack, angina, bypass surgery, angioplasty, arrhythmia)      HTN 10. OTHER SYMPTOMS: "Do you have any other symptoms?" (e.g., dizziness, chest pain, sweating, difficulty breathing)      No symptoms present currently  11. PREGNANCY: "Is there any chance you are pregnant?" "When was your last menstrual period?"      n/a  Protocols used: HEART RATE AND HEARTBEAT QUESTIONS-A-AH

## 2018-11-18 NOTE — Telephone Encounter (Signed)
FYI

## 2018-11-18 NOTE — Telephone Encounter (Signed)
See note

## 2018-11-19 ENCOUNTER — Other Ambulatory Visit (INDEPENDENT_AMBULATORY_CARE_PROVIDER_SITE_OTHER): Payer: 59

## 2018-11-19 ENCOUNTER — Other Ambulatory Visit: Payer: Self-pay

## 2018-11-19 DIAGNOSIS — R5383 Other fatigue: Secondary | ICD-10-CM | POA: Diagnosis not present

## 2018-11-19 LAB — COMPREHENSIVE METABOLIC PANEL
ALT: 13 U/L (ref 0–35)
AST: 13 U/L (ref 0–37)
Albumin: 4.1 g/dL (ref 3.5–5.2)
Alkaline Phosphatase: 70 U/L (ref 39–117)
BUN: 19 mg/dL (ref 6–23)
CO2: 27 mEq/L (ref 19–32)
Calcium: 9.3 mg/dL (ref 8.4–10.5)
Chloride: 104 mEq/L (ref 96–112)
Creatinine, Ser: 0.68 mg/dL (ref 0.40–1.20)
GFR: 88.05 mL/min (ref >60.00)
Glucose, Bld: 84 mg/dL (ref 70–99)
Potassium: 4.2 mEq/L (ref 3.5–5.1)
Sodium: 139 mEq/L (ref 135–145)
Total Bilirubin: 0.3 mg/dL (ref 0.2–1.2)
Total Protein: 6.3 g/dL (ref 6.0–8.3)

## 2018-11-19 LAB — LIPASE: Lipase: 58 U/L (ref 11.0–59.0)

## 2018-11-19 NOTE — Addendum Note (Signed)
Addended by: Francis Dowse T on: 11/19/2018 09:38 AM   Modules accepted: Orders

## 2018-11-20 ENCOUNTER — Encounter: Payer: Self-pay | Admitting: Family Medicine

## 2018-11-24 ENCOUNTER — Encounter: Payer: Self-pay | Admitting: Family Medicine

## 2018-11-30 ENCOUNTER — Other Ambulatory Visit: Payer: Self-pay

## 2018-11-30 DIAGNOSIS — R899 Unspecified abnormal finding in specimens from other organs, systems and tissues: Secondary | ICD-10-CM

## 2018-12-04 ENCOUNTER — Telehealth: Payer: Self-pay | Admitting: Cardiovascular Disease

## 2018-12-04 NOTE — Telephone Encounter (Signed)
New Message ° ° ° °Left message to confirm appt and answer covid questions  °

## 2018-12-07 ENCOUNTER — Other Ambulatory Visit: Payer: Self-pay

## 2018-12-07 ENCOUNTER — Encounter: Payer: Self-pay | Admitting: Cardiovascular Disease

## 2018-12-07 ENCOUNTER — Ambulatory Visit (INDEPENDENT_AMBULATORY_CARE_PROVIDER_SITE_OTHER): Payer: 59 | Admitting: Cardiovascular Disease

## 2018-12-07 VITALS — BP 148/92 | HR 58 | Ht 66.0 in | Wt 244.1 lb

## 2018-12-07 DIAGNOSIS — R0789 Other chest pain: Secondary | ICD-10-CM | POA: Diagnosis not present

## 2018-12-07 DIAGNOSIS — R079 Chest pain, unspecified: Secondary | ICD-10-CM

## 2018-12-07 NOTE — Patient Instructions (Signed)

## 2018-12-07 NOTE — Progress Notes (Signed)
Chief Complaint  Patient presents with  . Follow-up    PACs     History of Present Illness: 63 yo female with history of HTN, HLD, PACs and irritable bowel syndrome who is here today for cardiac follow up. 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 I saw her as a new patient May 2013 to establish cardiology care. She has been followed in [facility] with Dr. Linus Salmons. She has had several stress tests that were normal with negative stress echo in 2003, 2005 and a normal stress myoview in 2009. She has had chronic chest pain as well as irritable bowel syndrome. She has history of PACs that are controlled with Toprol. She also has esophagitis and had an esophageal dilatation 2014. She was noted to have atrial fib/SVT when admitted with urosepsis in 2017. Cardiac event monitor in 2017 with no evidence of atrial fibrillation or SVT. Coronary CTA in 2017 showed no evidence of CAD with calcium score of zero. Echo May 2017 with normal LV size and function. No valve disease.   She is here today for follow up. The patient denies any chest pain, dyspnea, lower extremity edema, orthopnea, PND, dizziness, near syncope or syncope. Rare palpitations.   Primary Care Physician: Briscoe Deutscher, DO   Past Medical History:  Diagnosis Date  . ADD (attention deficit disorder)   . Adenomyomatosis of gallbladder   . 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 Bilateral lower extremity edema   . Cervical intraepithelial neoplasia grade 1 03/17/2017  . Chronic ankle edema   . Chronic depressive disorder   . Circadian rhythm sleep disorder, shift work   . 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 Diabetes ([place])   . Diverticulosis    Minimal  . E. coli urinary tract infection 05/2018  . Eardrum rupture, left 05/2018  . Fatty liver disease, nonalcoholic   . Female bladder prolapse 03/17/2017  . First degree heart block by electrocardiogram 11/05/2017  . GERD (gastroesophageal reflux disease)   .  Glucose intolerance (impaired glucose tolerance)   . Hematuria 2014  . Hepatic steatosis   . History of hiatal hernia 2017   Small  . History of kidney stones   . History of ovarian cyst 10/1999   Right, ruptured  . HLA B27 (HLA B27 positive)   . HLA-B27 positive 03/29/2018  . Hx of pancreatitis   . Hydrosalpinx 10/1999   Right  . Hyperlipidemia   . Hyperopia with astigmatism and presbyopia, bilateral 10/27/2017  . Hypertension   . Hypoxemia    uses CPAP at night  . IBS (irritable bowel syndrome)   . Ischemic colitis ([place])   . Keratoconjunctivitis sicca of both eyes not specified as Sjogren's 10/27/2017  . Kyphosis   . Lateral epicondylitis of right elbow   . Leiomyoma 2008   Retroflexed retroverted uterus containing a single leiomyoma 1.7 cm greatest  . LGSIL (low grade squamous intraepithelial dysplasia) 08/05/2015  . Liver lesion 1999   Focal nodular hyperplasia  . Lumbar scoliosis 2010   Mild  . Migraine headache    per menopause  . Migraine headache without aura 05/10/2015  . Nephrolithiasis 2014   Left, minimal  . Obese   . Osteoarthritis   . PAC (premature atrial contraction)   . Paronychia   . Paroxysmal atrial fibrillation (HCC)   . PMS (premenstrual syndrome)   . PONV (postoperative nausea and vomiting)    had no vomitting with anesthesia from women's  . Pyelonephritis  01/2016  . Pyelonephritis 01/2016  . Right bundle branch block   . Sacroiliitis ([place])   . Seasonal allergies   . Sepsis ([place]) 01/2016  . SVT (supraventricular tachycardia) ([place])    a. 01/2016 - SVT, wide complex tachycardia, and brief afib in setting of severe sepsis.  . Thrombocytopenia ([place]) 01/2016   during sepsis  . Trigger ring finger of left hand   . UTI (urinary tract infection) 2014  . Vitamin D deficiency     Past Surgical History:  Procedure Laterality Date  . BLADDER SURGERY    . BREAST BIOPSY Left   . CERVICAL CONE BIOPSY    . CHOLECYSTECTOMY N/A 10/26/2014   Procedure:  LAPAROSCOPIC CHOLECYSTECTOMY;  Surgeon: Coralie Keens, MD;  Location: [facility];  Service: General;  Laterality: N/A;  . COLONOSCOPY W/ BIOPSIES  multiple  . CYSTOSCOPY WITH RETROGRADE PYELOGRAM, URETEROSCOPY AND STENT PLACEMENT Left 06/26/2018   Procedure: CYSTOSCOPY WITH RETROGRADE PYELOGRAM, URETEROSCOPY AND STENT PLACEMENT;  Surgeon: Alexis Frock, MD;  Location[facility];  Service: Urology;  Laterality: Left;  . DIAGNOSTIC LAPAROSCOPY    . DILATION AND CURETTAGE OF UTERUS  07,11  . DUPUYTREN CONTRACTURE RELEASE    . ESOPHAGOGASTRODUODENOSCOPY  multiple  . EYE SURGERY    . HOLMIUM LASER APPLICATION Left 5/0/3888   Procedure: HOLMIUM LASER APPLICATION;  Surgeon: Alexis Frock, MD;  Location: [facility];  Service: Urology;  Laterality: Left;  . KNEE ARTHROSCOPY Left 11/2008  . LAPAROSCOPIC VAGINAL HYSTERECTOMY WITH SALPINGO OOPHORECTOMY Bilateral 10/03/2015   Procedure: LAPAROSCOPIC ASSISTED VAGINAL HYSTERECTOMY WITH SALPINGECTOMY;  Surgeon: Eldred Manges, MD;  Location: [facility];  Service: Gynecology;  Laterality: Bilateral;  . LASIK  1990   RK  . LEFT FINGER SURGERY    . LIVER BIOPSY    . MASS EXCISION Left 12/24/2012   Procedure: LEFT EXCISION OF PALMAR MASS X TWO;  Surgeon: Roseanne Kaufman, MD;  Location: [facility];  Service: Orthopedics;  Laterality: Left;  . PHOTOREFRACTIVE KERATOTOMY  1994    Current Outpatient Medications  Medication Sig Dispense Refill  . ALPRAZolam (XANAX) 0.5 MG tablet Take 1 tablet (0.5 mg total) by mouth at bedtime as needed for anxiety. 20 tablet 0  . aspirin 325 MG tablet Take 325 mg by mouth daily.    . Blood Glucose Monitoring Suppl (FREESTYLE LITE) DEVI Check blood sugar 2-3 times per day.  Dx:E11.21 1 each 0  . cholecalciferol (VITAMIN D) 1000 UNITS tablet Take 2,000 Units by mouth daily.     . cyclobenzaprine (FLEXERIL) 10 MG tablet Take 1 tablet (10 mg total) by mouth 3 (three)  times daily as needed for muscle spasms. 20 tablet 0  . diclofenac sodium (VOLTAREN) 1 % GEL APPLY 2 GRAMS TOPICALLY 2 TIMES DAILY AS NEEDED. NOTE: 2 GRAMS IS EQUIVALENT TO A NICKEL SIZED AMOUNT.    . Dulaglutide (TRULICITY) 2.80 KL/4.9ZP SOPN 0.75 mg [place] q wk x 2 wks , then increase to 1.5 mg [place] if tolerating 4 pen 3  . losartan (COZAAR) 50 MG tablet TAKE 1 TABLET (50 MG TOTAL) BY MOUTH DAILY. 30 tablet 5  . metoprolol succinate (TOPROL-XL) 50 MG 24 hr tablet Take 1 tablet (50 mg total) by mouth daily. Take with or immediately following a meal. 90 tablet 2  . Omega-3 1000 MG CAPS Take 1 capsule by mouth daily.     . ondansetron (ZOFRAN ODT) 4 MG disintegrating tablet Take 1 tablet (4 mg total) by  mouth every 8 (eight) hours as needed for nausea or vomiting. 20 tablet 0  . saccharomyces boulardii (FLORASTOR) 250 MG capsule Take 1 capsule (250 mg total) by mouth 2 (two) times daily. 60 capsule 0  . simvastatin (ZOCOR) 40 MG tablet Take 1 tablet (40 mg total) by mouth every morning. 90 tablet 1  . Turmeric 450 MG CAPS Take 450 mg by mouth daily.      No current facility-administered medications for this visit.     Allergies  Allergen Reactions  . Ace Inhibitors Other (See Comments)    coughing  . Oxycodone-Acetaminophen Nausea And Vomiting  . Vicodin [Hydrocodone-Acetaminophen] Nausea And Vomiting  . Victoza [Liraglutide] Other (See Comments)    pancreatitis  . Adhesive [Tape] Rash    Not on stomach    Social History   Socioeconomic History  . Marital status: Divorced    Spouse name: Not on file  . Number of children: 1  . Years of education: Not on file  . Highest education level: Not on file  Occupational History  . Occupation: [employer]    Employer: [employer]  Social Needs  . Financial resource strain: Not on file  . Food insecurity    Worry: Not on file    Inability: Not on file  . Transportation needs    Medical: Not on file    Non-medical: Not on file  Tobacco  Use  . Smoking status: Never Smoker  . Smokeless tobacco: Never Used  Substance and Sexual Activity  . Alcohol use: Yes    Alcohol/week: 0.0 standard drinks    Comment: social  . Drug use: No  . Sexual activity: Not on file  Lifestyle  . Physical activity    Days per week: Not on file    Minutes per session: Not on file  . Stress: Not on file  Relationships  . Social Herbalist on phone: Not on file    Gets together: Not on file    Attends religious service: Not on file    Active member of club or organization: Not on file    Attends meetings of clubs or organizations: Not on file    Relationship status: Not on file  . Intimate partner violence    Fear of current or ex partner: Not on file    Emotionally abused: Not on file    Physically abused: Not on file    Forced sexual activity: Not on file  Other Topics Concern  . 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    Family History  Problem Relation Age of Onset  . Heart attack Father   . Heart disease Father   . Coronary artery disease Mother   . Hypertension Mother   . Heart disease Mother   . Heart attack Mother   . Diabetes Mother   . Hyperlipidemia Mother   . Kidney disease Mother   . Depression Mother   . Sleep apnea Mother   . Obesity Mother   . Hypertension Sister   . Hypertension Brother   . Colon cancer Neg Hx     Review of Systems:  As stated in the HPI and otherwise negative.   BP (!) 148/92   Pulse (!) 58   Ht _0  (1.676 m)   Wt 244 lb 1.9 oz (110.7 kg)   LMP 05/20/2008   SpO2 98%   BMI 39.40 kg/m  Physical Examination: General: Well developed, well nourished, NAD  HEENT: OP clear, mucus membranes moist  SKIN: warm, dry. No rashes. Neuro: No focal deficits  Musculoskeletal: Muscle strength 5/5 all ext  Psychiatric: Mood and affect normal  Neck: No JVD, no carotid bruits, no thyromegaly, no lymphadenopathy.  Lungs:Clear  bilaterally, no wheezes, rhonci, crackles Cardiovascular: Regular rate and rhythm. No murmurs, gallops or rubs. Abdomen:Soft. Bowel sounds present. Non-tender.  Extremities: No lower extremity edema. Pulses are 2 + in the bilateral DP/PT.  Echo May 2017: Left ventricle: The cavity size was normal. Systolic function was   normal. The estimated ejection fraction was in the range of 60%   to 65%. Wall motion was normal; there were no regional wall   motion abnormalities. Left ventricular diastolic function   parameters were normal. - Left atrium: The atrium was mildly dilated.  EKG:  EKG is ordered today. The ekg ordered today demonstrates Sinus brady, rate 58 bpm. 1st degree AV block. Poor R wave progression  Recent Labs: 10/22/2018: Hemoglobin 12.9; Platelets 241.0 11/19/2018: ALT 13; BUN 19; Creatinine, Ser 0.68; Potassium 4.2; Sodium 139   Lipid Panel    Component Value Date/Time   CHOL 223 (H) 10/26/2018 1537   CHOL 144 03/13/2017 0813   TRIG 183.0 (H) 10/26/2018 1537   HDL 48.20 10/26/2018 1537   HDL 41 03/13/2017 0813   CHOLHDL 5 10/26/2018 1537   VLDL 36.6 10/26/2018 1537   LDLCALC 139 (H) 10/26/2018 1537   LDLCALC 78 03/13/2017 0813     Wt Readings from Last 3 Encounters:  12/07/18 244 lb 1.9 oz (110.7 kg)  10/26/18 239 lb (108.4 kg)  10/20/18 235 lb (106.6 kg)     Other studies Reviewed: Additional studies/ records that were reviewed today include: . Review of the above records demonstrates:   Assessment and Plan:   1. Chest pain: Non cardiac chest pain. No recent chest pain. Coronary CTA in 2017 with no evidence of CAD.   2. SVT/PACs: Rare palpitations. Continue Toprol.    Current medicines are reviewed at length with the patient today.  The patient does not have concerns regarding medicines.  The following changes have been made:  no change  Labs/ tests ordered today include:   Orders Placed This Encounter  Procedures  . EKG 12-Lead    Disposition:    FU with me in 12 months   Signed, Lauree Chandler, MD 12/07/2018 11:38 AM    [facility] [address], [place], [place]  [address] Phone: [phone]; Fax: [phone]

## 2018-12-08 ENCOUNTER — Encounter: Payer: Self-pay | Admitting: Family Medicine

## 2018-12-09 ENCOUNTER — Encounter: Payer: Self-pay | Admitting: Family Medicine

## 2018-12-09 MED FILL — FREESTYLE LITE TEST STRIP: 30 days supply | Qty: 100 | Fill #2

## 2018-12-14 ENCOUNTER — Other Ambulatory Visit: Payer: Self-pay

## 2018-12-14 DIAGNOSIS — R5383 Other fatigue: Secondary | ICD-10-CM

## 2018-12-14 MED FILL — TRULICITY 1.5 MG/0.5 ML PEN: 1.5 | 28 days supply | Qty: 2 | Fill #0

## 2018-12-14 NOTE — Telephone Encounter (Signed)
Call in the 1.5 mg dose. Yes, could increase nausea again. Per, Dr. Veva Holes.  Med called in for patient.  Patient informed.

## 2018-12-14 NOTE — Progress Notes (Signed)
cmp

## 2018-12-16 DIAGNOSIS — M65331 Trigger finger, right middle finger: Secondary | ICD-10-CM | POA: Diagnosis not present

## 2018-12-16 DIAGNOSIS — M65332 Trigger finger, left middle finger: Secondary | ICD-10-CM | POA: Diagnosis not present

## 2018-12-21 DIAGNOSIS — L918 Other hypertrophic disorders of the skin: Secondary | ICD-10-CM | POA: Diagnosis not present

## 2018-12-21 DIAGNOSIS — L821 Other seborrheic keratosis: Secondary | ICD-10-CM | POA: Diagnosis not present

## 2018-12-21 DIAGNOSIS — D485 Neoplasm of uncertain behavior of skin: Secondary | ICD-10-CM | POA: Diagnosis not present

## 2018-12-21 DIAGNOSIS — D1801 Hemangioma of skin and subcutaneous tissue: Secondary | ICD-10-CM | POA: Diagnosis not present

## 2019-01-04 ENCOUNTER — Encounter: Payer: Self-pay | Admitting: Family Medicine

## 2019-01-04 DIAGNOSIS — M65341 Trigger finger, right ring finger: Secondary | ICD-10-CM | POA: Diagnosis not present

## 2019-01-04 DIAGNOSIS — M6530 Trigger finger, unspecified finger: Secondary | ICD-10-CM | POA: Insufficient documentation

## 2019-01-04 DIAGNOSIS — M65332 Trigger finger, left middle finger: Secondary | ICD-10-CM | POA: Diagnosis not present

## 2019-01-04 DIAGNOSIS — M79644 Pain in right finger(s): Secondary | ICD-10-CM | POA: Diagnosis not present

## 2019-01-04 DIAGNOSIS — M65331 Trigger finger, right middle finger: Secondary | ICD-10-CM | POA: Diagnosis not present

## 2019-01-04 NOTE — Telephone Encounter (Signed)
I have called patient she will have [place] done [place] if any issues she will let me know.

## 2019-01-05 ENCOUNTER — Other Ambulatory Visit: Payer: Self-pay

## 2019-01-05 ENCOUNTER — Other Ambulatory Visit (INDEPENDENT_AMBULATORY_CARE_PROVIDER_SITE_OTHER): Payer: 59

## 2019-01-05 ENCOUNTER — Encounter: Payer: Self-pay | Admitting: Family Medicine

## 2019-01-05 DIAGNOSIS — R899 Unspecified abnormal finding in specimens from other organs, systems and tissues: Secondary | ICD-10-CM | POA: Diagnosis not present

## 2019-01-05 DIAGNOSIS — R5383 Other fatigue: Secondary | ICD-10-CM

## 2019-01-05 LAB — LIPASE: Lipase: 23 U/L (ref 11.0–59.0)

## 2019-01-05 LAB — COMPREHENSIVE METABOLIC PANEL
ALT: 14 U/L (ref 0–35)
AST: 12 U/L (ref 0–37)
Albumin: 4.5 g/dL (ref 3.5–5.2)
Alkaline Phosphatase: 73 U/L (ref 39–117)
BUN: 17 mg/dL (ref 6–23)
CO2: 26 mEq/L (ref 19–32)
Calcium: 9.7 mg/dL (ref 8.4–10.5)
Chloride: 104 mEq/L (ref 96–112)
Creatinine, Ser: 0.66 mg/dL (ref 0.40–1.20)
GFR: 91.1 mL/min (ref >60.00)
Glucose, Bld: 130 mg/dL — ABNORMAL HIGH (ref 70–99)
Potassium: 4.5 mEq/L (ref 3.5–5.1)
Sodium: 137 mEq/L (ref 135–145)
Total Bilirubin: 0.4 mg/dL (ref 0.2–1.2)
Total Protein: 7 g/dL (ref 6.0–8.3)

## 2019-01-06 NOTE — Telephone Encounter (Signed)
Please see other encounter.

## 2019-01-11 MED FILL — LOSARTAN POTASSIUM 50 MG TA: 50 | 90 days supply | Qty: 90 | Fill #1

## 2019-01-11 MED FILL — TRULICITY 1.5 MG/0.5 ML PEN: 1.5 | 28 days supply | Qty: 2 | Fill #1

## 2019-01-21 DIAGNOSIS — M65341 Trigger finger, right ring finger: Secondary | ICD-10-CM | POA: Diagnosis not present

## 2019-01-21 DIAGNOSIS — M79644 Pain in right finger(s): Secondary | ICD-10-CM | POA: Diagnosis not present

## 2019-01-21 DIAGNOSIS — M65331 Trigger finger, right middle finger: Secondary | ICD-10-CM | POA: Diagnosis not present

## 2019-01-22 DIAGNOSIS — G4733 Obstructive sleep apnea (adult) (pediatric): Secondary | ICD-10-CM | POA: Diagnosis not present

## 2019-01-26 MED FILL — SIMVASTATIN 40 MG TABLET: 40 | 90 days supply | Qty: 90 | Fill #1

## 2019-02-08 ENCOUNTER — Encounter: Payer: Self-pay | Admitting: Family Medicine

## 2019-02-09 MED FILL — METOPROLOL SUCCINATE ER 50: 50 | 90 days supply | Qty: 90 | Fill #2

## 2019-02-11 ENCOUNTER — Encounter: Payer: Self-pay | Admitting: Family Medicine

## 2019-02-12 MED FILL — TRULICITY 1.5 MG/0.5 ML PEN: 1.5 | 28 days supply | Qty: 2 | Fill #2

## 2019-03-04 DIAGNOSIS — M25551 Pain in right hip: Secondary | ICD-10-CM | POA: Diagnosis not present

## 2019-03-04 DIAGNOSIS — M25552 Pain in left hip: Secondary | ICD-10-CM | POA: Diagnosis not present

## 2019-03-04 DIAGNOSIS — M7062 Trochanteric bursitis, left hip: Secondary | ICD-10-CM | POA: Diagnosis not present

## 2019-03-04 DIAGNOSIS — M7061 Trochanteric bursitis, right hip: Secondary | ICD-10-CM | POA: Diagnosis not present

## 2019-03-16 MED FILL — TRULICITY 1.5 MG/0.5 ML PEN: 1.5 | 28 days supply | Qty: 2 | Fill #3

## 2019-03-26 ENCOUNTER — Other Ambulatory Visit: Payer: Self-pay

## 2019-03-26 ENCOUNTER — Ambulatory Visit (INDEPENDENT_AMBULATORY_CARE_PROVIDER_SITE_OTHER): Payer: 59 | Admitting: Physician Assistant

## 2019-03-26 ENCOUNTER — Encounter: Payer: Self-pay | Admitting: Physician Assistant

## 2019-03-26 DIAGNOSIS — J020 Streptococcal pharyngitis: Secondary | ICD-10-CM | POA: Diagnosis not present

## 2019-03-26 LAB — POCT RAPID STREP A (OFFICE): Rapid Strep A Screen: POSITIVE — AB

## 2019-03-26 MED ORDER — METHYLPREDNISOLONE 4 MG PO TBPK: ORAL_TABLET | ORAL | 0 refills | Status: DC

## 2019-03-26 MED ORDER — AMOXICILLIN-POT CLAVULANATE 875-125 MG PO TABS: 1.0000 | ORAL_TABLET | Freq: Two times a day (BID) | ORAL | 0 refills | Status: DC

## 2019-03-26 MED ORDER — NYSTATIN 100000 UNIT/ML MT SUSP: 5.0000 mL | Freq: Four times a day (QID) | OROMUCOSAL | 0 refills | Status: DC

## 2019-03-26 MED ORDER — FLUCONAZOLE 150 MG PO TABS: 150.0000 mg | ORAL_TABLET | Freq: Once | ORAL | 3 refills | Status: AC

## 2019-03-26 MED FILL — METHYLPREDNISOLONE 4 MG TBP: 4 | 6 days supply | Qty: 21 | Fill #0

## 2019-03-26 MED FILL — FLUCONAZOLE 150 MG TABS: 150 | 1 days supply | Qty: 1 | Fill #0

## 2019-03-26 MED FILL — AMOX-CLAV 875-125 MG TABLET: 875-125 | 10 days supply | Qty: 20 | Fill #0

## 2019-03-26 MED FILL — NYSTATIN 100,000 UNITS/ML S: 100000 | 3 days supply | Qty: 60 | Fill #0

## 2019-03-26 NOTE — Progress Notes (Signed)
Alexandra Walker is a 63 y.o. female here for a new problem.   History of Present Illness:   Chief Complaint  Patient presents with  . Sore Throat    HPI  Sore throat Pt c/o sore throat and Left ear pressure x 1 week. Pt said she had a cold few days before was pulled out of work and tested for [place] on 10/26 rapid test was Negative. Pt also said had same symptoms yr ago and was thrush so pt treated her self with Magic Mouthwash and Diflucan but did not help. Pt now thinking she may have strep. Denies fever or chills. Does have white patches back of throat. Pt also concerned about Lt ear pressure due to yr ago it ruptured. Pt is taking Sudafed, Ibuprofen.  Denies: Neck pain, stridor, drooling, pain worse on 1 side of throat, strep exposure  Past Medical History:  Diagnosis Date  . ADD (attention deficit disorder)   . Adenomyomatosis of gallbladder   . 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 Bilateral lower extremity edema   . Cervical intraepithelial neoplasia grade 1 03/17/2017  . Chronic ankle edema   . Chronic depressive disorder   . Circadian rhythm sleep disorder, shift work   . 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 Diabetes ([place])   . Diverticulosis    Minimal  . E. coli urinary tract infection 05/2018  . Eardrum rupture, left 05/2018  . Fatty liver disease, nonalcoholic   . Female bladder prolapse 03/17/2017  . First degree heart block by electrocardiogram 11/05/2017  . GERD (gastroesophageal reflux disease)   . Glucose intolerance (impaired glucose tolerance)   . Hematuria 2014  . Hepatic steatosis   . History of hiatal hernia 2017   Small  . History of kidney stones   . History of ovarian cyst 10/1999   Right, ruptured  . HLA B27 (HLA B27 positive)   . HLA-B27 positive 03/29/2018  . Hx of pancreatitis   . Hydrosalpinx 10/1999   Right  . Hyperlipidemia   . Hyperopia with astigmatism and presbyopia, bilateral 10/27/2017  . Hypertension   .  Hypoxemia    uses CPAP at night  . IBS (irritable bowel syndrome)   . Ischemic colitis ([place])   . Keratoconjunctivitis sicca of both eyes not specified as Sjogren's 10/27/2017  . Kyphosis   . Lateral epicondylitis of right elbow   . Leiomyoma 2008   Retroflexed retroverted uterus containing a single leiomyoma 1.7 cm greatest  . LGSIL (low grade squamous intraepithelial dysplasia) 08/05/2015  . Liver lesion 1999   Focal nodular hyperplasia  . Lumbar scoliosis 2010   Mild  . Migraine headache    per menopause  . Migraine headache without aura 05/10/2015  . Nephrolithiasis 2014   Left, minimal  . Obese   . Osteoarthritis   . PAC (premature atrial contraction)   . Paronychia   . Paroxysmal atrial fibrillation (HCC)   . PMS (premenstrual syndrome)   . PONV (postoperative nausea and vomiting)    had no vomitting with anesthesia from women's  . Pyelonephritis 01/2016  . Pyelonephritis 01/2016  . Right bundle branch block   . Sacroiliitis ([place])   . Seasonal allergies   . Sepsis ([place]) 01/2016  . SVT (supraventricular tachycardia) ([place])    a. 01/2016 - SVT, wide complex tachycardia, and brief afib in setting of severe sepsis.  . Thrombocytopenia ([place]) 01/2016   during sepsis  . Trigger ring finger of left hand   .  UTI (urinary tract infection) 2014  . Vitamin D deficiency      Social History   Socioeconomic History  . Marital status: Divorced    Spouse name: Not on file  . Number of children: 1  . Years of education: Not on file  . Highest education level: Not on file  Occupational History  . Occupation: [employer]    Employer: [employer]  Social Needs  . Financial resource strain: Not on file  . Food insecurity    Worry: Not on file    Inability: Not on file  . Transportation needs    Medical: Not on file    Non-medical: Not on file  Tobacco Use  . Smoking status: Never Smoker  . Smokeless tobacco: Never Used  Substance and Sexual Activity  . Alcohol use:  Yes    Alcohol/week: 0.0 standard drinks    Comment: social  . Drug use: No  . Sexual activity: Not on file  Lifestyle  . Physical activity    Days per week: Not on file    Minutes per session: Not on file  . Stress: Not on file  Relationships  . Social Herbalist on phone: Not on file    Gets together: Not on file    Attends religious service: Not on file    Active member of club or organization: Not on file    Attends meetings of clubs or organizations: Not on file    Relationship status: Not on file  . Intimate partner violence    Fear of current or ex partner: Not on file    Emotionally abused: Not on file    Physically abused: Not on file    Forced sexual activity: Not on file  Other Topics Concern  . 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    Past Surgical History:  Procedure Laterality Date  . BLADDER SURGERY    . BREAST BIOPSY Left   . CERVICAL CONE BIOPSY    . CHOLECYSTECTOMY N/A 10/26/2014   Procedure: LAPAROSCOPIC CHOLECYSTECTOMY;  Surgeon: Coralie Keens, MD;  Location: [place];  Service: General;  Laterality: N/A;  . COLONOSCOPY W/ BIOPSIES  multiple  . CYSTOSCOPY WITH RETROGRADE PYELOGRAM, URETEROSCOPY AND STENT PLACEMENT Left 06/26/2018   Procedure: CYSTOSCOPY WITH RETROGRADE PYELOGRAM, URETEROSCOPY AND STENT PLACEMENT;  Surgeon: [place]is Frock, MD;  Location[facility];  Service: Urology;  Laterality: Left;  . DIAGNOSTIC LAPAROSCOPY    . DILATION AND CURETTAGE OF UTERUS  07,11  . DUPUYTREN CONTRACTURE RELEASE    . ESOPHAGOGASTRODUODENOSCOPY  multiple  . EYE SURGERY    . HOLMIUM LASER APPLICATION Left 11/26/238   Procedure: HOLMIUM LASER APPLICATION;  Surgeon: [place]is Frock, MD;  Location: [facility];  Service: Urology;  Laterality: Left;  . KNEE ARTHROSCOPY Left 11/2008  . LAPAROSCOPIC VAGINAL HYSTERECTOMY WITH SALPINGO OOPHORECTOMY Bilateral  10/03/2015   Procedure: LAPAROSCOPIC ASSISTED VAGINAL HYSTERECTOMY WITH SALPINGECTOMY;  Surgeon: Eldred Manges, MD;  Location: [facility];  Service: Gynecology;  Laterality: Bilateral;  . LASIK  1990   RK  . LEFT FINGER SURGERY    . LIVER BIOPSY    . MASS EXCISION Left 12/24/2012   Procedure: LEFT EXCISION OF PALMAR MASS X TWO;  Surgeon: Roseanne Kaufman, MD;  Location: [place];  Service: Orthopedics;  Laterality: Left;  . [place]    Family History  Problem Relation Age of Onset  . Heart attack Father   . Heart disease Father   . Coronary artery disease Mother   . Hypertension Mother   . Heart disease Mother   . Heart attack Mother   . Diabetes Mother   . Hyperlipidemia Mother   . Kidney disease Mother   . Depression Mother   . Sleep apnea Mother   . Obesity Mother   . Hypertension Sister   . Hypertension Brother   . Colon cancer Neg Hx     Allergies  Allergen Reactions  . Ace Inhibitors Other (See Comments)    coughing  . Oxycodone-Acetaminophen Nausea And Vomiting  . Vicodin [Hydrocodone-Acetaminophen] Nausea And Vomiting  . Victoza [Liraglutide] Other (See Comments)    pancreatitis  . Adhesive [Tape] Rash    Not on stomach    Current Medications:   Current Outpatient Medications:  .  ALPRAZolam (XANAX) 0.5 MG tablet, Take 1 tablet (0.5 mg total) by mouth at bedtime as needed for anxiety., Disp: 20 tablet, Rfl: 0 .  aspirin 325 MG tablet, Take 325 mg by mouth daily., Disp: , Rfl:  .  Blood Glucose Monitoring Suppl (FREESTYLE LITE) DEVI, Check blood sugar 2-3 times per day.  Dx:E11.21, Disp: 1 each, Rfl: 0 .  cholecalciferol (VITAMIN D) 1000 UNITS tablet, Take 2,000 Units by mouth daily. , Disp: , Rfl:  .  cyclobenzaprine (FLEXERIL) 10 MG tablet, Take 1 tablet (10 mg total) by mouth 3 (three) times daily as needed for muscle spasms., Disp: 20 tablet, Rfl: 0 .  diclofenac sodium (VOLTAREN) 1 % GEL, APPLY 2 GRAMS TOPICALLY 2 TIMES  DAILY AS NEEDED. NOTE: 2 GRAMS IS EQUIVALENT TO A NICKEL SIZED AMOUNT., Disp: , Rfl:  .  Dulaglutide (TRULICITY) 1.42 LT/5.3UY SOPN, 0.75 mg [place] q wk x 2 wks , then increase to 1.5 mg [place] if tolerating, Disp: 4 pen, Rfl: 3 .  losartan (COZAAR) 50 MG tablet, TAKE 1 TABLET (50 MG TOTAL) BY MOUTH DAILY., Disp: 30 tablet, Rfl: 5 .  metoprolol succinate (TOPROL-XL) 50 MG 24 hr tablet, Take 1 tablet (50 mg total) by mouth daily. Take with or immediately following a meal., Disp: 90 tablet, Rfl: 2 .  Omega-3 1000 MG CAPS, Take 1 capsule by mouth daily. , Disp: , Rfl:  .  ondansetron (ZOFRAN ODT) 4 MG disintegrating tablet, Take 1 tablet (4 mg total) by mouth every 8 (eight) hours as needed for nausea or vomiting., Disp: 20 tablet, Rfl: 0 .  saccharomyces boulardii (FLORASTOR) 250 MG capsule, Take 1 capsule (250 mg total) by mouth 2 (two) times daily., Disp: 60 capsule, Rfl: 0 .  simvastatin (ZOCOR) 40 MG tablet, Take 1 tablet (40 mg total) by mouth every morning., Disp: 90 tablet, Rfl: 1 .  Turmeric 450 MG CAPS, Take 450 mg by mouth daily. , Disp: , Rfl:  .  amoxicillin-clavulanate (AUGMENTIN) 875-125 MG tablet, Take 1 tablet by mouth 2 (two) times daily., Disp: 20 tablet, Rfl: 0 .  fluconazole (DIFLUCAN) 150 MG tablet, Take 1 tablet (150 mg total) by mouth once for 1 dose., Disp: 1 tablet, Rfl: 3 .  methylPREDNISolone (MEDROL DOSEPAK) 4 MG TBPK tablet, As directed 6, 5 , 4, 3, 2, 1, Disp: 21 tablet, Rfl: 0 .  nystatin (MYCOSTATIN) 100000 UNIT/ML suspension, Take 5 mLs (500,000 Units total) by mouth 4 (four) times daily., Disp: 60 mL, Rfl: 0   Review of Systems:   ROS  Negative unless otherwise specified per HPI.  Vitals:   There were no vitals filed for this visit.   There is no height or weight on file to calculate BMI.  Physical Exam:   Physical Exam Vitals signs and nursing note reviewed.  Constitutional:      General: She is not in acute distress.    Appearance: She is well-developed. She  is not ill-appearing or toxic-appearing.  HENT:     Head: Normocephalic and atraumatic.     Right Ear: Ear canal and external ear normal. A middle ear effusion (clear fluid) is present. Tympanic membrane is not erythematous, retracted or bulging.     Left Ear: Tympanic membrane, ear canal and external ear normal. Tympanic membrane is not erythematous, retracted or bulging.     Nose: Nose normal.     Right Sinus: No maxillary sinus tenderness or frontal sinus tenderness.     Left Sinus: No maxillary sinus tenderness or frontal sinus tenderness.     Mouth/Throat:     Mouth: Mucous membranes are moist.     Tongue: No lesions.     Pharynx: Uvula midline. No oropharyngeal exudate or posterior oropharyngeal erythema.  Eyes:     General: Lids are normal.     Conjunctiva/sclera: Conjunctivae normal.  Neck:     Trachea: Trachea normal.  Cardiovascular:     Rate and Rhythm: Normal rate and regular rhythm.     Heart sounds: Normal heart sounds, S1 normal and S2 normal.  Pulmonary:     Effort: Pulmonary effort is normal.     Breath sounds: Normal breath sounds. No decreased breath sounds, wheezing, rhonchi or rales.  Lymphadenopathy:     Cervical: No cervical adenopathy.  Skin:    General: Skin is warm and dry.  Neurological:     Mental Status: She is alert.  Psychiatric:        Speech: Speech normal.        Behavior: Behavior normal. Behavior is cooperative.      Assessment and Plan:   Larkyn was seen today for sore throat.  Diagnoses and all orders for this visit:  Strep pharyngitis Strep test positive.  Her eardrum is intact, she was concerned that this was possibly ruptured as it has been in the past.  With her to start Augmentin and oral prednisone.  I have also refilled her nystatin and Diflucan as she does have a history of thrush after taking antibiotics.  Worsening precautions advised. -     POCT rapid strep A  Other orders -     amoxicillin-clavulanate (AUGMENTIN) 875-125  MG tablet; Take 1 tablet by mouth 2 (two) times daily. -     methylPREDNISolone (MEDROL DOSEPAK) 4 MG TBPK tablet; As directed 6, 5 , 4, 3, 2, 1 -     nystatin (MYCOSTATIN) 100000 UNIT/ML suspension; Take 5 mLs (500,000 Units total) by mouth 4 (four) times daily. -     fluconazole (DIFLUCAN) 150 MG tablet; Take 1 tablet (150 mg total) by mouth once for 1 dose.    . Reviewed expectations re: course of current medical issues. . Discussed self-management of symptoms. . Outlined signs and symptoms indicating need for more acute intervention. . Patient verbalized understanding and all questions were answered. . See orders for this visit as documented in the electronic medical record. . Patient received an After-Visit Summary.  CMA or LPN served as scribe during this visit. History, Physical, and Plan performed by medical provider. The above documentation has been reviewed and is accurate and complete.  Inda Coke, PA-C

## 2019-03-31 ENCOUNTER — Encounter: Payer: Self-pay | Admitting: Physician Assistant

## 2019-03-31 DIAGNOSIS — H9190 Unspecified hearing loss, unspecified ear: Secondary | ICD-10-CM

## 2019-04-05 ENCOUNTER — Other Ambulatory Visit (INDEPENDENT_AMBULATORY_CARE_PROVIDER_SITE_OTHER): Payer: Self-pay | Admitting: Family Medicine

## 2019-04-05 DIAGNOSIS — E119 Type 2 diabetes mellitus without complications: Secondary | ICD-10-CM

## 2019-04-05 MED ORDER — TRULICITY 0.75 MG/0.5ML ~~LOC~~ SOAJ: SUBCUTANEOUS | 3 refills | Status: DC

## 2019-04-05 NOTE — Telephone Encounter (Signed)
Alexandra Walker can you look into this please. Pt wants to be called on her cell number.

## 2019-04-05 NOTE — Telephone Encounter (Signed)
Medication Refill - Medication: Dulaglutide (TRULICITY) [id number] SOPN [id number]   Has the patient contacted their pharmacy? Yes.   (Agent: If no, request that the patient contact the pharmacy for the refill.) (Agent: If yes, when and what did the pharmacy advise?)  Preferred Pharmacy (with phone number or street name): [facility]   Agent: Please be advised that RX refills may take up to 3 business days. We ask that you follow-up with your pharmacy.

## 2019-04-07 ENCOUNTER — Other Ambulatory Visit: Payer: Self-pay

## 2019-04-07 ENCOUNTER — Ambulatory Visit (INDEPENDENT_AMBULATORY_CARE_PROVIDER_SITE_OTHER): Payer: 59 | Admitting: Otolaryngology

## 2019-04-07 ENCOUNTER — Encounter (INDEPENDENT_AMBULATORY_CARE_PROVIDER_SITE_OTHER): Payer: Self-pay | Admitting: Otolaryngology

## 2019-04-07 VITALS — Temp 97.5°F

## 2019-04-07 DIAGNOSIS — H6982 Other specified disorders of Eustachian tube, left ear: Secondary | ICD-10-CM

## 2019-04-07 NOTE — Progress Notes (Signed)
HPI: Alexandra Walker is a 62 y.o. female who presents for evaluation of left ear complaints.  A couple years ago she had a bad left earache and had a ruptured left TM with drainage.  A couple weeks ago she developed blockage of her left ear with left ear pain and took over-the-counter decongestant medication.  The pain is much better she still has mild congestion which is gradually improving.  Her hearing is gradually getting better as is the discomfort on the decongestant therapy.  She is not having ear pain today. 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  She worked at [employer] from 83-93. Past Medical History:  Diagnosis Date  . ADD (attention deficit disorder)   . Adenomyomatosis of gallbladder   . 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 Bilateral lower extremity edema   . Cervical intraepithelial neoplasia grade 1 03/17/2017  . Chronic ankle edema   . Chronic depressive disorder   . Circadian rhythm sleep disorder, shift work   . 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 Diabetes ([place])   . Diverticulosis    Minimal  . E. coli urinary tract infection 05/2018  . Eardrum rupture, left 05/2018  . Fatty liver disease, nonalcoholic   . Female bladder prolapse 03/17/2017  . First degree heart block by electrocardiogram 11/05/2017  . GERD (gastroesophageal reflux disease)   . Glucose intolerance (impaired glucose tolerance)   . Hematuria 2014  . Hepatic steatosis   . History of hiatal hernia 2017   Small  . History of kidney stones   . History of ovarian cyst 10/1999   Right, ruptured  . HLA B27 (HLA B27 positive)   . HLA-B27 positive 03/29/2018  . Hx of pancreatitis   . Hydrosalpinx 10/1999   Right  . Hyperlipidemia   . Hyperopia with astigmatism and presbyopia, bilateral 10/27/2017  . Hypertension   . Hypoxemia    uses CPAP at night  . IBS (irritable bowel syndrome)   . Ischemic colitis ([place])   . Keratoconjunctivitis sicca of  both eyes not specified as Sjogren's 10/27/2017  . Kyphosis   . Lateral epicondylitis of right elbow   . Leiomyoma 2008   Retroflexed retroverted uterus containing a single leiomyoma 1.7 cm greatest  . LGSIL (low grade squamous intraepithelial dysplasia) 08/05/2015  . Liver lesion 1999   Focal nodular hyperplasia  . Lumbar scoliosis 2010   Mild  . Migraine headache    per menopause  . Migraine headache without aura 05/10/2015  . Nephrolithiasis 2014   Left, minimal  . Obese   . Osteoarthritis   . PAC (premature atrial contraction)   . Paronychia   . Paroxysmal atrial fibrillation (HCC)   . PMS (premenstrual syndrome)   . PONV (postoperative nausea and vomiting)    had no vomitting with anesthesia from women's  . Pyelonephritis 01/2016  . Pyelonephritis 01/2016  . Right bundle branch block   . Sacroiliitis ([place])   . Seasonal allergies   . Sepsis ([place]) 01/2016  . SVT (supraventricular tachycardia) ([place])    a. 01/2016 - SVT, wide complex tachycardia, and brief afib in setting of severe sepsis.  . Thrombocytopenia ([place]) 01/2016   during sepsis  . Trigger ring finger of left hand   . UTI (urinary tract infection) 2014  . Vitamin D deficiency    Past Surgical History:  Procedure Laterality Date  . BLADDER SURGERY    . BREAST BIOPSY Left   .  CERVICAL CONE BIOPSY    . CHOLECYSTECTOMY N/A 10/26/2014   Procedure: LAPAROSCOPIC CHOLECYSTECTOMY;  Surgeon: Coralie Keens, MD;  Location: [place];  Service: General;  Laterality: N/A;  . COLONOSCOPY W/ BIOPSIES  multiple  . CYSTOSCOPY WITH RETROGRADE PYELOGRAM, URETEROSCOPY AND STENT PLACEMENT Left 06/26/2018   Procedure: CYSTOSCOPY WITH RETROGRADE PYELOGRAM, URETEROSCOPY AND STENT PLACEMENT;  Surgeon: Alexis Frock, MD;  Location[facility];  Service: Urology;  Laterality: Left;  . DIAGNOSTIC LAPAROSCOPY    . DILATION AND CURETTAGE OF UTERUS  07,11  . DUPUYTREN CONTRACTURE RELEASE    .  ESOPHAGOGASTRODUODENOSCOPY  multiple  . EYE SURGERY    . HOLMIUM LASER APPLICATION Left 12/23/275   Procedure: HOLMIUM LASER APPLICATION;  Surgeon: Alexis Frock, MD;  Location: [facility];  Service: Urology;  Laterality: Left;  . KNEE ARTHROSCOPY Left 11/2008  . LAPAROSCOPIC VAGINAL HYSTERECTOMY WITH SALPINGO OOPHORECTOMY Bilateral 10/03/2015   Procedure: LAPAROSCOPIC ASSISTED VAGINAL HYSTERECTOMY WITH SALPINGECTOMY;  Surgeon: Eldred Manges, MD;  Location: [facility];  Service: Gynecology;  Laterality: Bilateral;  . LASIK  1990   RK  . LEFT FINGER SURGERY    . LIVER BIOPSY    . MASS EXCISION Left 12/24/2012   Procedure: LEFT EXCISION OF PALMAR MASS X TWO;  Surgeon: Roseanne Kaufman, MD;  Location: [place];  Service: Orthopedics;  Laterality: Left;  . [place]   Social History   Socioeconomic History  . Marital status: Divorced    Spouse name: Not on file  . Number of children: 1  . Years of education: Not on file  . Highest education level: Not on file  Occupational History  . Occupation: [place]    Employer: [employer]  Social Needs  . Financial resource strain: Not on file  . Food insecurity    Worry: Not on file    Inability: Not on file  . Transportation needs    Medical: Not on file    Non-medical: Not on file  Tobacco Use  . Smoking status: Never Smoker  . Smokeless tobacco: Never Used  Substance and Sexual Activity  . Alcohol use: Yes    Alcohol/week: 0.0 standard drinks    Comment: social  . Drug use: No  . Sexual activity: Not on file  Lifestyle  . Physical activity    Days per week: Not on file    Minutes per session: Not on file  . Stress: Not on file  Relationships  . Social Herbalist on phone: Not on file    Gets together: Not on file    Attends religious service: Not on file    Active member of club or organization: Not on file    Attends meetings of clubs or organizations:  Not on file    Relationship status: Not on file  Other Topics Concern  . 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   Family History  Problem Relation Age of Onset  . Heart attack Father   . Heart disease Father   . Coronary artery disease Mother   . Hypertension Mother   . Heart disease Mother   . Heart attack Mother   . Diabetes Mother   . Hyperlipidemia Mother   . Kidney disease Mother   . Depression Mother   . Sleep apnea Mother   . Obesity Mother   . Hypertension Sister   .  Hypertension Brother   . Colon cancer Neg Hx    Allergies  Allergen Reactions  . Ace Inhibitors Other (See Comments)    coughing  . Oxycodone-Acetaminophen Nausea And Vomiting  . Vicodin [Hydrocodone-Acetaminophen] Nausea And Vomiting  . Victoza [Liraglutide] Other (See Comments)    pancreatitis  . Adhesive [Tape] Rash    Not on stomach   Prior to Admission medications   Medication Sig Start Date End Date Taking? Authorizing Provider  ALPRAZolam Duanne Moron) 0.5 MG tablet Take 1 tablet (0.5 mg total) by mouth at bedtime as needed for anxiety. 03/30/18  Yes Briscoe Deutscher, DO  amoxicillin-clavulanate (AUGMENTIN) 875-125 MG tablet Take 1 tablet by mouth 2 (two) times daily. 03/26/19  Yes Inda Coke, PA  aspirin 325 MG tablet Take 325 mg by mouth daily.   Yes [provider]  Blood Glucose Monitoring Suppl (FREESTYLE LITE) DEVI Check blood sugar 2-3 times per day.  Dx:E11.21 10/21/16  Yes Shelda Pal, DO  cholecalciferol (VITAMIN D) 1000 UNITS tablet Take 2,000 Units by mouth daily.    Yes [provider]  cyclobenzaprine (FLEXERIL) 10 MG tablet Take 1 tablet (10 mg total) by mouth 3 (three) times daily as needed for muscle spasms. 07/24/18  Yes Briscoe Deutscher, DO  diclofenac sodium (VOLTAREN) 1 % GEL APPLY 2 GRAMS TOPICALLY 2 TIMES DAILY AS NEEDED. NOTE: 2 GRAMS IS EQUIVALENT TO A NICKEL SIZED AMOUNT. 05/30/16  Yes [provider]  Dulaglutide (TRULICITY) 5.17 OH/6.0VP SOPN 0.75 mg [place] q wk x 2 wks , then increase to 1.5 mg  if tolerating 04/05/19  Yes Briscoe Deutscher, DO  losartan (COZAAR) 50 MG tablet TAKE 1 TABLET (50 MG TOTAL) BY MOUTH DAILY. 10/14/18  Yes Briscoe Deutscher, DO  methylPREDNISolone (MEDROL DOSEPAK) 4 MG TBPK tablet As directed 6, 5 , 4, 3, 2, 1 03/26/19  Yes Morene Rankins, [place], PA  metoprolol succinate (TOPROL-XL) 50 MG 24 hr tablet Take 1 tablet (50 mg total) by mouth daily. Take with or immediately following a meal. 03/31/18  Yes Briscoe Deutscher, DO  nystatin (MYCOSTATIN) 100000 UNIT/ML suspension Take 5 mLs (500,000 Units total) by mouth 4 (four) times daily. 03/26/19  Yes Worley, Aldona Bar, PA  Omega-3 1000 MG CAPS Take 1 capsule by mouth daily.    Yes [provider]  ondansetron (ZOFRAN ODT) 4 MG disintegrating tablet Take 1 tablet (4 mg total) by mouth every 8 (eight) hours as needed for nausea or vomiting. 10/20/18  Yes Briscoe Deutscher, DO  saccharomyces boulardii (FLORASTOR) 250 MG capsule Take 1 capsule (250 mg total) by mouth 2 (two) times daily. 02/02/16  Yes Regalado, Belkys A, MD  simvastatin (ZOCOR) 40 MG tablet Take 1 tablet (40 mg total) by mouth every morning. 10/29/18  Yes Briscoe Deutscher, DO  Turmeric 450 MG CAPS Take 450 mg by mouth daily.    Yes [provider]     Positive ROS: Otherwise negative  All other systems have been reviewed and were otherwise negative with the exception of those mentioned in the HPI and as above.  Physical Exam: Constitutional: Alert, well-appearing, no acute distress Ears: External ears without lesions or tenderness. Ear canals reveal small amount of cerumen in both ear canals that was cleaned with a suction.  TMs were clear bilaterally.  No middle ear effusion noted on the left side.  On tuning fork testing AC was greater than BC bilaterally.  Subjectively she had a mild bilateral SNHL using the 1024 tuning fork.  Hearing was  symmetric. Nasal: External nose without lesions. Septum slight septal deviation to the left.. Clear nasal passages Oral: Lips and gums without lesions. Tongue and palate mucosa without lesions. Posterior oropharynx clear.  Tonsils are symmetric and small. Neck: No palpable adenopathy or masses Respiratory: Breathing comfortably  Skin: No facial/neck lesions or rash noted.  Procedures  Assessment: Eustachian tube dysfunction Mild SNHL Resolved otitis media.  Plan: Prescribed Nasacort 2 sprays each nostril to use if she has any persistent nasal congestion or ear problems. Would recommend use of decongestant therapy in the acute setting. She will follow up here as needed any further ear pain or hearing problems. Also discussed possible audiogram to evaluate probable early presbycusis.  Radene Journey, MD

## 2019-04-14 ENCOUNTER — Other Ambulatory Visit (HOSPITAL_BASED_OUTPATIENT_CLINIC_OR_DEPARTMENT_OTHER): Payer: Self-pay | Admitting: Family Medicine

## 2019-04-14 ENCOUNTER — Other Ambulatory Visit: Payer: Self-pay | Admitting: Family Medicine

## 2019-04-14 DIAGNOSIS — M7061 Trochanteric bursitis, right hip: Secondary | ICD-10-CM | POA: Diagnosis not present

## 2019-04-14 DIAGNOSIS — Z1231 Encounter for screening mammogram for malignant neoplasm of breast: Secondary | ICD-10-CM

## 2019-04-14 DIAGNOSIS — M7062 Trochanteric bursitis, left hip: Secondary | ICD-10-CM | POA: Diagnosis not present

## 2019-04-14 DIAGNOSIS — M25552 Pain in left hip: Secondary | ICD-10-CM | POA: Diagnosis not present

## 2019-04-14 DIAGNOSIS — M25551 Pain in right hip: Secondary | ICD-10-CM | POA: Diagnosis not present

## 2019-04-14 MED FILL — TRULICITY 1.5 MG/0.5 ML PEN: 1.5 | 84 days supply | Qty: 6 | Fill #0

## 2019-05-03 ENCOUNTER — Ambulatory Visit (HOSPITAL_BASED_OUTPATIENT_CLINIC_OR_DEPARTMENT_OTHER)
Admission: RE | Admit: 2019-05-03 | Discharge: 2019-05-03 | Disposition: A | Discharge status: Home / Self Care | Payer: 59 | Source: Ambulatory Visit | Attending: Family Medicine | Admitting: Family Medicine

## 2019-05-03 ENCOUNTER — Encounter (HOSPITAL_BASED_OUTPATIENT_CLINIC_OR_DEPARTMENT_OTHER): Payer: Self-pay

## 2019-05-03 ENCOUNTER — Ambulatory Visit (HOSPITAL_BASED_OUTPATIENT_CLINIC_OR_DEPARTMENT_OTHER): Payer: 59

## 2019-05-03 ENCOUNTER — Other Ambulatory Visit: Payer: Self-pay

## 2019-05-03 DIAGNOSIS — Z1231 Encounter for screening mammogram for malignant neoplasm of breast: Secondary | ICD-10-CM | POA: Insufficient documentation

## 2019-05-06 DIAGNOSIS — M7071 Other bursitis of hip, right hip: Secondary | ICD-10-CM | POA: Insufficient documentation

## 2019-05-11 MED FILL — LOSARTAN POTASSIUM 50 MG TA: 50 | 30 days supply | Qty: 30 | Fill #1

## 2019-05-11 MED FILL — FLUCONAZOLE 150 MG TABS: 150 | 1 days supply | Qty: 1 | Fill #1

## 2019-05-12 ENCOUNTER — Encounter: Payer: Self-pay | Admitting: Family Medicine

## 2019-05-12 NOTE — Telephone Encounter (Signed)
See note  Copied from [place] [phone]. Topic: Quick Communication - See Telephone Encounter >> May 12, 2019  3:46 PM Loma Boston wrote: CRM for notification. See Telephone encounter for: 05/12/19. 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

## 2019-05-13 ENCOUNTER — Other Ambulatory Visit (INDEPENDENT_AMBULATORY_CARE_PROVIDER_SITE_OTHER): Payer: 59

## 2019-05-13 ENCOUNTER — Other Ambulatory Visit: Payer: Self-pay

## 2019-05-13 ENCOUNTER — Encounter: Payer: Self-pay | Admitting: Family Medicine

## 2019-05-13 DIAGNOSIS — Z20828 Contact with and (suspected) exposure to other viral communicable diseases: Secondary | ICD-10-CM

## 2019-05-13 NOTE — Telephone Encounter (Signed)
Called and spoke with patient and notified her that Dr. Jonni Sanger ok'd the covid antibodies screening. Patient scheduled a lab appointment to come in the office today to have bloodwork done.

## 2019-05-14 LAB — SAR COV2 SEROLOGY (COVID19)AB(IGG),IA: SARS CoV2 AB IGG: NEGATIVE

## 2019-05-18 ENCOUNTER — Encounter: Payer: Self-pay | Admitting: Family Medicine

## 2019-05-18 ENCOUNTER — Other Ambulatory Visit: Payer: Self-pay | Admitting: Family Medicine

## 2019-05-24 ENCOUNTER — Encounter: Payer: Self-pay | Admitting: Family Medicine

## 2019-05-24 ENCOUNTER — Ambulatory Visit (INDEPENDENT_AMBULATORY_CARE_PROVIDER_SITE_OTHER): Payer: 59 | Admitting: Family Medicine

## 2019-05-24 ENCOUNTER — Other Ambulatory Visit: Payer: Self-pay

## 2019-05-24 ENCOUNTER — Other Ambulatory Visit: Payer: Self-pay | Admitting: Family Medicine

## 2019-05-24 VITALS — BP 122/86 | HR 62 | Temp 98.2°F | Ht 66.0 in | Wt 239.0 lb

## 2019-05-24 DIAGNOSIS — F902 Attention-deficit hyperactivity disorder, combined type: Secondary | ICD-10-CM | POA: Diagnosis not present

## 2019-05-24 DIAGNOSIS — G4733 Obstructive sleep apnea (adult) (pediatric): Secondary | ICD-10-CM | POA: Diagnosis not present

## 2019-05-24 DIAGNOSIS — E1159 Type 2 diabetes mellitus with other circulatory complications: Secondary | ICD-10-CM

## 2019-05-24 DIAGNOSIS — I152 Hypertension secondary to endocrine disorders: Secondary | ICD-10-CM

## 2019-05-24 DIAGNOSIS — I491 Atrial premature depolarization: Secondary | ICD-10-CM

## 2019-05-24 DIAGNOSIS — N200 Calculus of kidney: Secondary | ICD-10-CM | POA: Diagnosis not present

## 2019-05-24 DIAGNOSIS — I10 Essential (primary) hypertension: Secondary | ICD-10-CM | POA: Diagnosis not present

## 2019-05-24 DIAGNOSIS — E785 Hyperlipidemia, unspecified: Secondary | ICD-10-CM | POA: Diagnosis not present

## 2019-05-24 DIAGNOSIS — R002 Palpitations: Secondary | ICD-10-CM

## 2019-05-24 DIAGNOSIS — E1169 Type 2 diabetes mellitus with other specified complication: Secondary | ICD-10-CM | POA: Diagnosis not present

## 2019-05-24 DIAGNOSIS — K760 Fatty (change of) liver, not elsewhere classified: Secondary | ICD-10-CM

## 2019-05-24 DIAGNOSIS — E6609 Other obesity due to excess calories: Secondary | ICD-10-CM | POA: Insufficient documentation

## 2019-05-24 DIAGNOSIS — Z8619 Personal history of other infectious and parasitic diseases: Secondary | ICD-10-CM | POA: Diagnosis not present

## 2019-05-24 DIAGNOSIS — E119 Type 2 diabetes mellitus without complications: Secondary | ICD-10-CM | POA: Diagnosis not present

## 2019-05-24 DIAGNOSIS — Z9989 Dependence on other enabling machines and devices: Secondary | ICD-10-CM

## 2019-05-24 HISTORY — DX: Other obesity due to excess calories: E66.09

## 2019-05-24 LAB — POCT GLYCOSYLATED HEMOGLOBIN (HGB A1C): Hemoglobin A1C: 5.2 % (ref 4.0–5.6)

## 2019-05-24 MED ORDER — LISDEXAMFETAMINE DIMESYLATE 10 MG PO CAPS: 10.0000 mg | ORAL_CAPSULE | Freq: Every day | ORAL | 0 refills | Status: DC

## 2019-05-24 NOTE — Patient Instructions (Signed)
Please return in 3 months for diabetes follow up  I have refilled your vyvanse to use as needed.  Good luck with healthy eating and exercise!! Here's to 2021! I will release your lab results to you on your MyChart account with further instructions. Please reply with any questions.    It was a pleasure meeting you today! Thank you for choosing [name] to meet your healthcare needs! I truly look forward to working with you. If you have any questions or concerns, please send me a message via Mychart or call the office at [phone].

## 2019-05-24 NOTE — Progress Notes (Signed)
Subjective  CC:  Chief Complaint  Patient presents with  . Transitions Of Care    transitioning care fro Dr. Juleen China  . Hypertension  . Diabetes  . Hyperlipidemia    HPI: Alexandra Walker is a 64 y.o. female who presents to [facility] at [place] today to establish care with me as a new patient.  I have reviewed chart: multiple old records, labs, reports from specialists and updated PL accordingly. We discussed her PMH in detail She has the following concerns or needs:  Prediabetes: treated as diabetes; initially with metformin. Now on trulicity (in part for weight loss) and ARB. Has h/o pancreatitis in part possibly due to victoza but also had obstructive gallstone at that time as well. Doing well on meds: feels it helps with appetite suppression. dxd with prediabetes in 2007 and has been on meds ever since. a1cs are all < 6.0; pt reports highest ever was 6.1 but has IFG with fastings up to 126 in the past. She needs to restart exercise and weight loss. She has been to healthy weight and wellness. Does best with weight watchers. No neuropathy sxs. No foot sores. No retinopathy or renal disease.   HTN and palpitations (sx pacs) does well on BB. Evaluated by cards.   HLD on statin with most recent levels above goal. nonfasting today. Compliant with statin and no side effects. On low intensity statin.   Hepatic steatosis: reviewed notes from [place]. No elevated lfts. Working on weight loss.   ADD: does better on vyvanse. bp was not affected. Helps at work.   Obesity: trulicity is helpful: ? Worth risk vs benefit given h/o pancreatitis on glp-1 in past and prediabetes only.  Nephrolithiasis: s/p urospesis in 2017. Since has had large stone removed; had had recurrent pyelo. Doing well now.   OSA on cpap. Reviewed neuro notes. Stable on cpap and feels rested. 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   Assessment  1. Type 2 diabetes mellitus without  complication, without long-term current use of insulin ([place]), on Metformin   2. Hypertension associated with diabetes ([place]), on Losartan and Metoprolol   3. Hyperlipidemia associated with type 2 diabetes mellitus ([place]), on Zocor   4. Nonalcoholic fatty liver disease, followed by [facility]   5. Attention deficit hyperactivity disorder (ADHD), combined type   6. OSA on CPAP, followed by Neurology/Sleep   7. Palpitations   8. Nephrolithiasis   9. History of sepsis   10. Class 2 severe obesity due to excess calories with serious comorbidity in adult, unspecified BMI ([place])      Plan   Prediabetes/obesity: discussed possible continuation of trulicity; I favor healthy diet and exercise. Recheck 3 months. Will need urine MAC ratio. She is on an arb already. If loses weight, could likely resolve many of these issues and use less meds. She seems motivated. Flu shot up to date.   HTN is controlled as are palpitations  HLD: recheck today. Change to higher intensity statin if not at goal. Consider fish oil if trigs very high (nonfasting state today).   Fatty liver: weight loss   osa is stable.   ADD: restart low dose vyvanse for sx control while at work.   HM is up to date.   Follow up:  3 months for weight and dm f/u Orders Placed This Encounter  Procedures  . CBC w/Diff  . CMP  . Lipids  . TSH  . POCT HgB  A1C   Meds ordered this encounter  Medications  . lisdexamfetamine (VYVANSE) 10 MG capsule    Sig: Take 1 capsule (10 mg total) by mouth daily.    Dispense:  30 capsule    Refill:  0     Depression screen [facility] 2/9 10/26/2018 10/28/2016  Decreased Interest 0 1  Down, Depressed, Hopeless 0 3  PHQ - 2 Score 0 4  Altered sleeping 0 2  Tired, decreased energy 1 2  Change in appetite 3 2  Feeling bad or failure about yourself  1 2  Trouble concentrating 0 3  Moving slowly or fidgety/restless 0 0  Suicidal thoughts 0 0  PHQ-9 Score 5 15  Difficult doing work/chores Not  difficult at all -  Some recent data might be hidden    We updated and reviewed the patient's past history in detail and it is documented below.  Patient Active Problem List   Diagnosis Date Noted  . Obesity due to excess calories with serious comorbidity 05/24/2019    Priority: High  . Attention deficit hyperactivity disorder (ADHD), combined type 10/19/2017    Priority: High  . OSA on CPAP, followed by Neurology/Sleep 09/26/2016    Priority: High  . Essential hypertension 01/31/2016    Priority: High  . Type 2 diabetes mellitus without complication, without long-term current use of inslin ([place]) 01/31/2016    Priority: High  . Mixed hyperlipidemia 05/10/2015    Priority: High  . Palpitations 09/19/2011    Priority: High    PACs controlled on BB   . Bilateral hip bursitis 05/06/2019    Priority: Medium  . Nephrolithiasis 03/29/2018    Priority: Medium    H/o recurrent pyelo/urosepsis; s/p stone retrieval and stenting.  Has nonobstructive stone that persists.  Dr. Tresa Moore   . 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HCC)     Priority: Medium  . Recurrent circadian rhythm sleep disorder, shift work type 03/31/2017    Priority: Medium  . IBS (irritable bowel syndrome), diarrhea predominant 03/17/2017    Priority: Medium  . Hepatic steatosis 09/26/2016    Priority: Medium  . Diverticulosis of colon 05/16/2018    Priority: Low  . Drug-induced pancreatitis, 2015, thought to be due to [facility] 03/29/2018    Priority: Low  . History of vitreous detachment OU, HTN retinopathy OU, High myopia OU, Cataracts OU, Vitreous floaters OU, Keratoconjunctivitis sicca OU not specified as Sjogren's 03/29/2018    Priority: Low  . History of sepsis 02/01/2016    Priority: Low    Due to urosepsis: had acute short lived afib/psvt. Neg cardiac eval since.    . Vitamin D deficiency, on Vit D 2000 IU daily 05/10/2015    Priority: [place] Maintenance  Topic Date Due  . TETANUS/TDAP  06/07/2017  . FOOT  EXAM  10/26/2019  . HEMOGLOBIN A1C  11/21/2019  . OPHTHALMOLOGY EXAM  01/17/2020  . MAMMOGRAM  05/02/2020  . COLONOSCOPY  05/18/2022  . INFLUENZA VACCINE  Completed  . Hepatitis C Screening  Completed  . HIV Screening  Completed   Immunization History  Administered Date(s) Administered  . Influenza, High Dose Seasonal PF 01/21/2013  . Influenza,inj,Quad PF,6+ Mos 01/21/2013, 03/09/2018, 03/19/2019  . Influenza,inj,quad, With Preservative 02/18/2015  . Influenza-Unspecified 02/18/2015, 02/23/2016, 03/13/2017  . Pneumococcal Polysaccharide-23 08/01/2008  . Rabies, IM 10/14/2011, 10/17/2011, 10/23/2011, 10/31/2011  . Rabies, intradermal 10/14/2011  . Tdap 06/08/2007   Current Meds  Medication Sig  . aspirin 325 MG tablet Take 325 mg by mouth  daily.  . Blood Glucose Monitoring Suppl (FREESTYLE LITE) DEVI Check blood sugar 2-3 times per day.  Dx:E11.21  . cholecalciferol (VITAMIN D) 1000 UNITS tablet Take 2,000 Units by mouth daily.   . cyclobenzaprine (FLEXERIL) 10 MG tablet Take 1 tablet (10 mg total) by mouth 3 (three) times daily as needed for muscle spasms.  . diclofenac sodium (VOLTAREN) 1 % GEL APPLY 2 GRAMS TOPICALLY 2 TIMES DAILY AS NEEDED. NOTE: 2 GRAMS IS EQUIVALENT TO A NICKEL SIZED AMOUNT.  . Dulaglutide (TRULICITY) 4.85 IO/2.7OJ SOPN 0.75 mg [place] q wk x 2 wks , then increase to 1.5 mg [place] if tolerating  . losartan (COZAAR) 50 MG tablet TAKE 1 TABLET (50 MG TOTAL) BY MOUTH DAILY.  . metoprolol succinate (TOPROL-XL) 50 MG 24 hr tablet Take 1 tablet (50 mg total) by mouth daily. Take with or immediately following a meal.  . Omega-3 1000 MG CAPS Take 1 capsule by mouth daily.   . ondansetron (ZOFRAN ODT) 4 MG disintegrating tablet Take 1 tablet (4 mg total) by mouth every 8 (eight) hours as needed for nausea or vomiting.  . simvastatin (ZOCOR) 40 MG tablet Take 1 tablet (40 mg total) by mouth every morning.  . Turmeric 450 MG CAPS Take 450 mg by mouth daily.      Allergies: Patient is allergic to ace inhibitors; oxycodone-acetaminophen; vicodin [hydrocodone-acetaminophen]; victoza [liraglutide]; and adhesive [tape]. Past Medical History Patient  has a past medical history of ADD (attention deficit disorder), Allergy, 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[place]), Chronic depressive disorder, Circadian rhythm sleep disorder, shift work, DDD (degenerative disc disease), lumbosacral (05/2012), Diabetes ([place]), Diverticulosis, GERD (gastroesophageal reflux disease), Hepatic steatosis, History of hiatal hernia (2017), HLA B27 (HLA B27 positive), pancreatitis, Hyperlipidemia, Hypertension, IBS (irritable bowel syndrome), Lumbar scoliosis (2010), Migraine headache without aura (05/10/2015), Nephrolithiasis (2014), Obesity due to excess calories with serious comorbidity (05/24/2019), Right bundle branch block, Sepsis ([place]) (01/2016), and Vitamin D deficiency. Past Surgical History Patient  has a past surgical history that includes Colonoscopy w/ biopsies (multiple); Esophagogastroduodenoscopy (multiple); Knee arthroscopy (Left, 11/2008); Dilation and curettage of uterus (07,11); Cervical cone biopsy; Mass excision (Left, 12/24/2012); Diagnostic laparoscopy; Cholecystectomy (N/A, 10/26/2014); Bladder surgery; Laparoscopic vaginal hysterectomy with salpingo oophorectomy (Bilateral, 10/03/2015); LEFT FINGER SURGERY; Breast biopsy (Left); Eye surgery; LASIK (1990); Dupuytren contracture release; Liver biopsy; Photorefractive keratotomy (1994); Cystoscopy with retrograde pyelogram, ureteroscopy and stent placement (Left, 06/26/2018); and Holmium laser application (Left, 5/0/0938). Family History: Patient family history includes Coronary artery disease in her mother; Depression in her mother; Diabetes in her mother; Heart attack in her father and mother; Heart disease in her father and mother; Hyperlipidemia in her mother; Hypertension in her brother, mother, and sister; Kidney disease in her mother;  Obesity in her mother; Sleep apnea in her mother. Social History:  Patient  reports that she has never smoked. She has never used smokeless tobacco. She reports current alcohol use. She reports that she does not use drugs.  Review of Systems: Constitutional: negative for fever or malaise Ophthalmic: negative for photophobia, double vision or loss of vision Cardiovascular: negative for chest pain, dyspnea on exertion, or new LE swelling Respiratory: negative for SOB or persistent cough Gastrointestinal: negative for abdominal pain, change in bowel habits or melena Genitourinary: negative for dysuria or gross hematuria Musculoskeletal: negative for new gait disturbance or muscular weakness Integumentary: negative for new or persistent rashes Neurological: negative for TIA or stroke symptoms Psychiatric: negative for SI or delusions Allergic/Immunologic: negative for hives  Patient Care Team  Relationship Specialty Notifications Start End  Leamon Arnt, MD PCP - General Family Medicine  04/07/19   Roseanne Kaufman, MD Consulting Physician Orthopedic Surgery  02/11/19   Burnell Blanks, MD Consulting Physician Cardiology  05/24/19   Cornelius Moras, NP Referring Physician Gastroenterology  05/24/19   Dohmeier, Asencion Partridge, MD Consulting Physician Neurology  05/24/19   Alexis Frock, MD Consulting Physician Urology  05/24/19   Gatha Mayer, MD Consulting Physician Gastroenterology  05/24/19     Objective  Vitals: BP 122/86 (BP Location: Right Arm, Patient Position: Sitting, Cuff Size: Large)   Pulse 62   Temp 98.2 F (36.8 C) (Temporal)   Ht _0  (1.676 m)   Wt 239 lb (108.4 kg)   LMP 05/20/2008   SpO2 95%   BMI 38.58 kg/m  General:  Well developed, well nourished, no acute distress , obese Psych:  Alert and oriented,normal mood and affect Cardiovascular:  RRR without gallop, rub or murmur, no edema Respiratory:  Good breath sounds bilaterally, CTAB with normal respiratory  effort Skin:  Warm, no rashes or suspicious lesions noted Neurologic:    Mental status is normal. No tremor Normal gait   Commons side effects, risks, benefits, and alternatives for medications and treatment plan prescribed today were discussed, and the patient expressed understanding of the given instructions. Patient is instructed to call or message via MyChart if he/she has any questions or concerns regarding our treatment plan. No barriers to understanding were identified. We discussed Red Flag symptoms and signs in detail. Patient expressed understanding regarding what to do in case of urgent or emergency type symptoms.   Medication list was reconciled, printed and provided to the patient in AVS. Patient instructions and summary information was reviewed with the patient as documented in the AVS. This note was prepared with assistance of Dragon voice recognition software. Occasional wrong-word or sound-a-like substitutions may have occurred due to the inherent limitations of voice recognition software  This visit occurred during the SARS-CoV-2 public health emergency.  Safety protocols were in place, including screening questions prior to the visit, additional usage of staff PPE, and extensive cleaning of exam room while observing appropriate contact time as indicated for disinfecting solutions.

## 2019-05-25 LAB — COMPREHENSIVE METABOLIC PANEL
ALT: 14 U/L (ref 0–35)
AST: 15 U/L (ref 0–37)
Albumin: 4 g/dL (ref 3.5–5.2)
Alkaline Phosphatase: 67 U/L (ref 39–117)
BUN: 16 mg/dL (ref 6–23)
CO2: 29 mEq/L (ref 19–32)
Calcium: 9.4 mg/dL (ref 8.4–10.5)
Chloride: 104 mEq/L (ref 96–112)
Creatinine, Ser: 0.7 mg/dL (ref 0.40–1.20)
GFR: 85.01 mL/min (ref >60.00)
Glucose, Bld: 102 mg/dL — ABNORMAL HIGH (ref 70–99)
Potassium: 4.3 mEq/L (ref 3.5–5.1)
Sodium: 139 mEq/L (ref 135–145)
Total Bilirubin: 0.3 mg/dL (ref 0.2–1.2)
Total Protein: 6.4 g/dL (ref 6.0–8.3)

## 2019-05-25 LAB — LIPID PANEL
Cholesterol: 197 mg/dL (ref 0–200)
HDL: 39.2 mg/dL (ref >39.00)
NonHDL: 157.56
Total CHOL/HDL Ratio: 5
Triglycerides: 248 mg/dL — ABNORMAL HIGH (ref 0.0–149.0)
VLDL: 49.6 mg/dL — ABNORMAL HIGH (ref 0.0–40.0)

## 2019-05-25 LAB — CBC WITH DIFFERENTIAL/PLATELET
Basophils Absolute: 0.1 10*3/uL (ref 0.0–0.1)
Basophils Relative: 1.1 % (ref 0.0–3.0)
Eosinophils Absolute: 0.2 10*3/uL (ref 0.0–0.7)
Eosinophils Relative: 3.1 % (ref 0.0–5.0)
HCT: 40 % (ref 36.0–46.0)
Hemoglobin: 13.3 g/dL (ref 12.0–15.0)
Lymphocytes Relative: 23.6 % (ref 12.0–46.0)
Lymphs Abs: 1.3 10*3/uL (ref 0.7–4.0)
MCHC: 33.3 g/dL (ref 30.0–36.0)
MCV: 86 fl (ref 78.0–100.0)
Monocytes Absolute: 0.4 10*3/uL (ref 0.1–1.0)
Monocytes Relative: 8.1 % (ref 3.0–12.0)
Neutro Abs: 3.5 10*3/uL (ref 1.4–7.7)
Neutrophils Relative %: 64.1 % (ref 43.0–77.0)
Platelets: 251 10*3/uL (ref 150.0–400.0)
RBC: 4.65 Mil/uL (ref 3.87–5.11)
RDW: 13.4 % (ref 11.5–15.5)
WBC: 5.5 10*3/uL (ref 4.0–10.5)

## 2019-05-25 LAB — TSH: TSH: 1.13 u[IU]/mL (ref 0.35–4.50)

## 2019-05-25 LAB — LDL CHOLESTEROL, DIRECT: Direct LDL: 118 mg/dL

## 2019-05-25 MED ORDER — ROSUVASTATIN CALCIUM 10 MG PO TABS: 10.0000 mg | ORAL_TABLET | Freq: Every day | ORAL | 3 refills | Status: DC

## 2019-05-25 MED FILL — ROSUVASTATIN CALCIUM 10 MG: 10 | 90 days supply | Qty: 90 | Fill #0

## 2019-05-25 NOTE — Addendum Note (Signed)
Addended by: Billey Chang on: 05/25/2019 03:53 PM   Modules accepted: Orders

## 2019-05-28 ENCOUNTER — Telehealth: Payer: Self-pay

## 2019-05-28 ENCOUNTER — Encounter: Payer: Self-pay | Admitting: Family Medicine

## 2019-05-28 ENCOUNTER — Other Ambulatory Visit: Payer: Self-pay

## 2019-05-28 DIAGNOSIS — I152 Hypertension secondary to endocrine disorders: Secondary | ICD-10-CM

## 2019-05-28 DIAGNOSIS — E1159 Type 2 diabetes mellitus with other circulatory complications: Secondary | ICD-10-CM

## 2019-05-28 DIAGNOSIS — I491 Atrial premature depolarization: Secondary | ICD-10-CM

## 2019-05-28 MED ORDER — METOPROLOL SUCCINATE ER 50 MG PO TB24: 50.0000 mg | ORAL_TABLET | Freq: Every day | ORAL | 2 refills | Status: DC

## 2019-05-28 MED FILL — METOPROLOL SUCCINATE ER 50: 50 | 90 days supply | Qty: 90 | Fill #0

## 2019-05-28 NOTE — Telephone Encounter (Signed)
Pt is requesting medication refill for toprol . Pt stated that she is completely out and would need this filled today. Would like for the medication to be sent to [facility].

## 2019-05-28 NOTE — Telephone Encounter (Signed)
Toprol sent to patient's pharmacy this morning.

## 2019-05-28 NOTE — Telephone Encounter (Signed)
Medication sent.

## 2019-05-31 DIAGNOSIS — M25552 Pain in left hip: Secondary | ICD-10-CM | POA: Diagnosis not present

## 2019-05-31 DIAGNOSIS — M25551 Pain in right hip: Secondary | ICD-10-CM | POA: Diagnosis not present

## 2019-05-31 DIAGNOSIS — M7061 Trochanteric bursitis, right hip: Secondary | ICD-10-CM | POA: Diagnosis not present

## 2019-05-31 DIAGNOSIS — M7062 Trochanteric bursitis, left hip: Secondary | ICD-10-CM | POA: Diagnosis not present

## 2019-05-31 NOTE — Progress Notes (Signed)
Please call patient: I have reviewed his/her lab results. Please refer to her mychart lab result message and discuss with her. She has not yet viewed it. She needs stronger cholesterol medication. thanks

## 2019-07-05 ENCOUNTER — Encounter: Payer: Self-pay | Admitting: Family Medicine

## 2019-07-05 MED ORDER — CYCLOBENZAPRINE HCL 10 MG PO TABS: 10.0000 mg | ORAL_TABLET | Freq: Three times a day (TID) | ORAL | 1 refills | Status: DC | PRN

## 2019-07-05 MED FILL — TRULICITY 1.5 MG/0.5 ML PEN: 1.5 | 28 days supply | Qty: 2 | Fill #1

## 2019-07-05 MED FILL — CYCLOBENZAPRINE HCL 10 MG T: 10 | 10 days supply | Qty: 30 | Fill #0

## 2019-07-05 MED FILL — LOSARTAN POTASSIUM 50 MG TA: 50 | 30 days supply | Qty: 30 | Fill #2

## 2019-07-31 ENCOUNTER — Encounter: Payer: Self-pay | Admitting: Family Medicine

## 2019-08-02 ENCOUNTER — Other Ambulatory Visit: Payer: Self-pay

## 2019-08-02 ENCOUNTER — Other Ambulatory Visit (INDEPENDENT_AMBULATORY_CARE_PROVIDER_SITE_OTHER): Payer: Self-pay | Admitting: Family Medicine

## 2019-08-02 DIAGNOSIS — E119 Type 2 diabetes mellitus without complications: Secondary | ICD-10-CM

## 2019-08-02 MED ORDER — CLOBETASOL PROPIONATE 0.05 % EX CREA: 1.0000 "application " | TOPICAL_CREAM | Freq: Two times a day (BID) | CUTANEOUS | 0 refills | Status: AC

## 2019-08-02 MED FILL — CLOBETASOL PROPIONATE 0.05: 0.05 | 7 days supply | Qty: 30 | Fill #0

## 2019-08-02 MED FILL — LOSARTAN POTASSIUM 50 MG TA: 50 | 30 days supply | Qty: 30 | Fill #3

## 2019-08-04 ENCOUNTER — Other Ambulatory Visit: Payer: Self-pay

## 2019-08-04 DIAGNOSIS — E119 Type 2 diabetes mellitus without complications: Secondary | ICD-10-CM

## 2019-08-04 MED ORDER — TRULICITY 0.75 MG/0.5ML ~~LOC~~ SOAJ: SUBCUTANEOUS | 3 refills | Status: DC

## 2019-08-05 ENCOUNTER — Encounter: Payer: Self-pay | Admitting: Neurology

## 2019-08-09 ENCOUNTER — Encounter: Payer: Self-pay | Admitting: Neurology

## 2019-08-09 ENCOUNTER — Other Ambulatory Visit: Payer: Self-pay

## 2019-08-09 ENCOUNTER — Ambulatory Visit (INDEPENDENT_AMBULATORY_CARE_PROVIDER_SITE_OTHER): Payer: 59 | Admitting: Neurology

## 2019-08-09 VITALS — BP 147/88 | HR 66 | Temp 97.3°F | Ht 66.0 in | Wt 240.0 lb

## 2019-08-09 DIAGNOSIS — G4726 Circadian rhythm sleep disorder, shift work type: Secondary | ICD-10-CM

## 2019-08-09 DIAGNOSIS — Z9989 Dependence on other enabling machines and devices: Secondary | ICD-10-CM

## 2019-08-09 DIAGNOSIS — G4733 Obstructive sleep apnea (adult) (pediatric): Secondary | ICD-10-CM | POA: Diagnosis not present

## 2019-08-09 NOTE — Progress Notes (Signed)
SLEEP MEDICINE CLINIC   Provider:  Larey Seat, MD   Referring Provider: Briscoe Deutscher, DO Primary Care Physician:  Leamon Arnt, MD  Chief Complaint  Patient presents with  . Follow-up    pt alone, rm 10. pt states that machine is working well. DME [employer]. she has the ozone cleaner and had questions in regards to cleaning the machine.      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 She has been struggling with obesity ( BMI is now 38.7). Alexandra Walker is a compliant CPAP user, she is using an air sense 10 machine with a serial #[id number]. 100% Percent compliance by the number of days used- but since she is a night shift worker there are only 23 days where she could sleep through with her CPAP in use-77%.  This is an expected ratio.   Her average usage on days used is 5 hours 9 minutes, the CPAP is set to 8 cm pressure with 3 cm EPR and her AHI is only 0.2/h which speaks for an excellent resolution. She has no central apneas emerging the 95th percentile pressure cannot be determined since the machine is not used as an auto titrate her.  She has minimal air leakage which speaks for very good mask fit. She is not daytime sleepy, feels refreshed and alert.  Her job has not been more stressful, she sees 3-5 Covid positive - pregnancies pr week, usually learn after the admission of the Covid status. She is vaccinated for both shots. Her daughter had Covid in February, last month.      08-05-2018. 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  It was a busy night again she reports.   Patient with anticardiolipid ab syndrome, atrial fib, DM 2,   Alexandra Walker is a 64 y.o. female [employer] employee, she just returned form a conference on [place]. 100 people. She came home on Sunday the 15th.  I have the Wi-Fi download for Alexandra Walker CPAP compliance and since she is a  night shift worker 12-hour shifts on Tuesday, Wednesday , Thursday- her sleep is much better with these nights on bloc.  The patient has an overall compliance of 27 out of 30 days, she used the machine over 4 hours on 63% of these days.  This is also due to her restricted daytime ability to sleep.  For a night shift worker this is a good compliance.  The machine has a set pressure of 8 cmH2O with 3 cm EPR and her residual AHI is 0.2 excellent resolution no air leaks, I am very happy with his compliance.  Her depression score was endorsed at 3 out of 15 points, the Epworth sleepiness score was endorsed at 5 points fatigue severity score at 34 points. I did renew Alexandra Walker CPAP supply prescription for the coming year.    Interval history from 09/26/2016. Alexandra Walker returned after I had  ordered a CPAP titration for her based on a recent referral by Alexandra Walker, D.O. I had seen the patient already in 2007- but at the time she could not tolerate CPAP in a SPLIT study.  The sleep study was ordered based on a baseline polysomnography performed at the [facility] sleep center in December 2017, which had diagnosed the patient with sleep apnea, hypopnea at an AHI of 21 per hour,  moderate hypoxemia of sleep, mean saturation was only 87% SPO2 raising the question of an underlying cardiopulmonary disease but probably hypoventilation due to obesity. The patient was titrated to 8 cm water pressure the AHI was 0.9 per hour, she did not produce any periodic limb movements oxygen nadir rose to 85% of the time spent at 88% saturation or below equal at only 25 minutes. She was fitted with a nasal pillow while using a mouthguard.  As the pleasure to look at the patient's compliance record today, which is excellent she has used to machine 28 out of 30 days with a 77% compliance but hours. 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 Average user time is 5 hours 21  minutes, residual apnea index is 0.3 the 95th percentile pressure has been met, she does not have air leaks. I would like for her to continue with the current machine and settings as well as the interface. The patient also reported that her hepatologist at [facility] voiced approval of CPAP therapy for all patients with fatty liver disease. I hope that the CPAP will help her to reduce body weight, promote sleep before midnight allowing her to have more balanced insulin household, and continues to allow her be less daytime sleepy. She endorsed today the Epworth sleepiness score at only 5 points and fatigue severity score was 36 points. She has visibly lost weight, her face and neck are smaller and I adjusted the measurements in my physical exam note. I will order a ONO for her  After she mentioned that her dentist felt continuous clenching represents hypoxemia- or a reaction to low oxygen.   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 She was exhausted. In the meantime she has made attempts to lose some weight, she joined tae kwon do classes, and has lost another 14 pounds since joining the gym in October. She is also with a new primary care physician Alexandra Walker. Until recently she was followed by Alexandra Walker, whose office is now affiliated with the [facility] system and out of network. She is taking metformin 1000 g twice a day right now, still considered prediabetic ( ?).  Her after meal the glucometer readings have been in normal range but her fasting morning glucose is elevated constantly. She reports measuring 130s 140s.  She was also diagnosed with a fatty liver, non-hepatitic. She has joined [facility], a biopsy was recently scheduled. Negative fibrosis scan.  She also has chronic lower extremity edema, monopolar depressive  disorder, circadian rhythm sleep disorder, diabetes, Baker's cyst and ganglion of the left knee, GERD, anticardiolipin syndrome, hepatic steatosis, hyperlipidemia, hypertension, hypokalemia, hypomagnesemia, sacroiliitis. She was admitted with sepsis in September 2017, was very sick and developed supraventricular tachycardia in the setting as well as a brief run of atrial fibrillation. Her follow-up cardiac studies returned normal. There is a concern that the patient with his morning glucose levels lacks slow wave sleep or delta sleep, thereby is not generating enough insulin or human growth hormone.  Alexandra Walker referred her to Alexandra Walker for sleep study AHI during REM sleep was 26.1 per hour supine AHI 81.2 per hour REM latency was very long 273 minutes. Sleep efficiency was 89% oxygen nadir 77% mean oxygen saturation was 87.8.  No PLMS noted.  Overall AHI was 21.0 per hour. Given these data ,strongly recommend CPAP titration.  She got no results of this sleep study, and no appointment  Within 3 or 4 weeks after sleep test, she called our office to have an outside sleep study interpreted and explained. Sleep habits are as follows: bedtime is 11 PM , asleep by 11.  30 minutes - sleeping through for 6 hours, and when not working ,may have fragmented , dream rich  sleep until 10 AM. Sleep in a cool, quiet and dark room, alone. One pillow, sleeping on the side. Neck hurts, uses special pillows. Has a stuffy nose, uses afrin. . Social history:   Single, mother on BiPAP-  Never used CPAP. Midwife, losing weight, going to the gym. No tobacco use , no ETOH use. Low carb. Caffeine - 1 cup a day. 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    Review of Systems: Out of a complete 14 system review, the patient complains of only the following symptoms, and all other reviewed systems are negative.  Given that she has a history of rhinitis and stuffy  nose I would like for her to have heated humidification and try and nasal mask, nasal pillow and full face mask for comfort. She should feel free to choose her interface. Mallampatti 3, neck circumference 15 ".   How likely are you to doze in the following situations: 0 = not likely, 1 = slight chance, 2 = moderate chance, 3 = high chance  Sitting and Reading? Watching Television? Sitting inactive in a public place (theater or meeting)? Lying down in the afternoon when circumstances permit? Sitting and talking to someone? Sitting quietly after lunch without alcohol? In a car, while stopped for a few minutes in traffic? As a passenger in a car for an hour without a break?  Total = 6/ 24      Social History   Socioeconomic History  . Marital status: Divorced    Spouse name: Not on file  . Number of children: 1  . Years of education: Not on file  . Highest education level: Not on file  Occupational History  . 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 Smoking status: Never Smoker  . Smokeless tobacco: Never Used  Substance and Sexual Activity  . Alcohol use: Yes    Alcohol/week: 0.0 standard drinks    Comment: social  . Drug use: No  . Sexual activity: Yes    Birth control/protection: Surgical  Other Topics Concern  . 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   Social Determinants of Health   Financial Resource Strain:   . Difficulty of Paying Living Expenses:   Food Insecurity:   . Worried About Charity fundraiser in the Last Year:   . Arboriculturist in the Last Year:   Transportation Needs:   . Film/video editor (Medical):   Marland Kitchen Lack of Transportation (Non-Medical):   Physical Activity:   . Days of Exercise per Week:   . Minutes of Exercise per Session:   Stress:   . Feeling of Stress :   Social Connections:   . Frequency of Communication with Friends and Family:    . Frequency of Social Gatherings with Friends and Family:   . Attends Religious Services:   .  Active Member of Clubs or Organizations:   . Attends Archivist Meetings:   Marland Kitchen Marital Status:   Intimate Partner Violence:   . Fear of Current or Ex-Partner:   . Emotionally Abused:   Marland Kitchen Physically Abused:   . Sexually Abused:     Family History  Problem Relation Age of Onset  . Heart attack Father   . Heart disease Father   . Coronary artery disease Mother   . Hypertension Mother   . Heart disease Mother   . Heart attack Mother   . Diabetes Mother   . Hyperlipidemia Mother   . Kidney disease Mother   . Depression Mother   . Sleep apnea Mother   . Obesity Mother   . Hypertension Sister   . Hypertension Brother   . Colon cancer Neg Hx     Past Medical History:  Diagnosis Date  . ADD (attention deficit disorder)   . Allergy   . 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[place])   . Chronic depressive disorder   . Circadian rhythm sleep disorder, shift work   . 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 Diabetes ([place])   . Diverticulosis    Minimal  . GERD (gastroesophageal reflux disease)   . Hepatic steatosis   . History of hiatal hernia 2017   Small  . HLA B27 (HLA B27 positive)   . Hx of pancreatitis   . Hyperlipidemia   . Hypertension   . IBS (irritable bowel syndrome)   . Lumbar scoliosis 2010   Mild  . Migraine headache without aura 05/10/2015  . Nephrolithiasis 2014   Left, minimal  . Obesity due to excess calories with serious comorbidity 05/24/2019  . Right bundle branch block   . Sepsis ([place]) 01/2016  . Vitamin D deficiency     Past Surgical History:  Procedure Laterality Date  . BLADDER SURGERY    . BREAST BIOPSY Left   . CERVICAL CONE BIOPSY    . CHOLECYSTECTOMY N/A 10/26/2014   Procedure: LAPAROSCOPIC CHOLECYSTECTOMY;  Surgeon: Coralie Keens, MD;  Location: [place];  Service: General;  Laterality: N/A;  . COLONOSCOPY W/  BIOPSIES  multiple  . CYSTOSCOPY WITH RETROGRADE PYELOGRAM, URETEROSCOPY AND STENT PLACEMENT Left 06/26/2018   Procedure: CYSTOSCOPY WITH RETROGRADE PYELOGRAM, URETEROSCOPY AND STENT PLACEMENT;  Surgeon: Alexis Frock, MD;  Location[facility];  Service: Urology;  Laterality: Left;  . DIAGNOSTIC LAPAROSCOPY    . DILATION AND CURETTAGE OF UTERUS  07,11  . DUPUYTREN CONTRACTURE RELEASE    . ESOPHAGOGASTRODUODENOSCOPY  multiple  . EYE SURGERY    . HOLMIUM LASER APPLICATION Left 7/0/2637   Procedure: HOLMIUM LASER APPLICATION;  Surgeon: Alexis Frock, MD;  Location: [facility];  Service: Urology;  Laterality: Left;  . KNEE ARTHROSCOPY Left 11/2008  . LAPAROSCOPIC VAGINAL HYSTERECTOMY WITH SALPINGO OOPHORECTOMY Bilateral 10/03/2015   Procedure: LAPAROSCOPIC ASSISTED VAGINAL HYSTERECTOMY WITH SALPINGECTOMY;  Surgeon: Eldred Manges, MD;  Location: [facility];  Service: Gynecology;  Laterality: Bilateral;  . LASIK  1990   RK  . LEFT FINGER SURGERY    . LIVER BIOPSY    . MASS EXCISION Left 12/24/2012   Procedure: LEFT EXCISION OF PALMAR MASS X TWO;  Surgeon: Roseanne Kaufman, MD;  Location: [place];  Service: Orthopedics;  Laterality: Left;  . PHOTOREFRACTIVE KERATOTOMY  1994    Current Outpatient Medications  Medication Sig Dispense Refill  . aspirin 325 MG tablet Take 325 mg by mouth daily.    Marland Kitchen  Blood Glucose Monitoring Suppl (FREESTYLE LITE) DEVI Check blood sugar 2-3 times per day.  Dx:E11.21 1 each 0  . cholecalciferol (VITAMIN D) 1000 UNITS tablet Take 2,000 Units by mouth daily.     . clobetasol cream (TEMOVATE) 5.36 % Apply 1 application topically 2 (two) times daily for 7 days. 30 g 0  . cyclobenzaprine (FLEXERIL) 10 MG tablet Take 1 tablet (10 mg total) by mouth 3 (three) times daily as needed for muscle spasms. 30 tablet 1  . diclofenac sodium (VOLTAREN) 1 % GEL APPLY 2 GRAMS TOPICALLY 2 TIMES DAILY AS NEEDED. NOTE: 2 GRAMS IS EQUIVALENT  TO A NICKEL SIZED AMOUNT.    . Dulaglutide (TRULICITY) 4.68 EH/2.1YY SOPN 0.75 mg [place] q wk x 2 wks , then increase to 1.5 mg [place] if tolerating 4 pen 3  . losartan (COZAAR) 50 MG tablet TAKE 1 TABLET (50 MG TOTAL) BY MOUTH DAILY. 30 tablet 5  . metoprolol succinate (TOPROL-XL) 50 MG 24 hr tablet Take 1 tablet (50 mg total) by mouth daily. Take with or immediately following a meal. 90 tablet 2  . Omega-3 1000 MG CAPS Take 1 capsule by mouth daily.     . ondansetron (ZOFRAN ODT) 4 MG disintegrating tablet Take 1 tablet (4 mg total) by mouth every 8 (eight) hours as needed for nausea or vomiting. 20 tablet 0  . rosuvastatin (CRESTOR) 10 MG tablet Take 1 tablet (10 mg total) by mouth daily. 90 tablet 3  . Turmeric 450 MG CAPS Take 450 mg by mouth daily.     Marland Kitchen lisdexamfetamine (VYVANSE) 10 MG capsule Take 1 capsule (10 mg total) by mouth daily. (Patient not taking: Reported on 08/09/2019) 30 capsule 0   No current facility-administered medications for this visit.    Allergies as of 08/09/2019 - Review Complete 08/09/2019  Allergen Reaction Noted  . Ace inhibitors Other (See Comments) 09/05/2015  . Oxycodone-acetaminophen Nausea And Vomiting 09/19/2011  . Vicodin [hydrocodone-acetaminophen] Nausea And Vomiting 09/19/2011  . Victoza [liraglutide] Other (See Comments) 09/01/2014  . Adhesive [tape] Rash 06/18/2018    Vitals: BP (!) 147/88   Pulse 66   Temp (!) 97.3 F (36.3 C)   Ht '5\' 6"'  (1.676 m)   Wt 240 lb (108.9 kg)   LMP 05/20/2008   BMI 38.74 kg/m  Last Weight:  Wt Readings from Last 1 Encounters:  08/09/19 240 lb (108.9 kg)   QMG:NOIB mass index is 38.74 kg/m.     Last Height:   Ht Readings from Last 1 Encounters:  08/09/19 '5\' 6"'  (1.676 m)    Physical exam:  General: The patient is awake, alert and appears not in acute distress. The patient is well groomed. Head: Normocephalic, atraumatic. Neck is supple. Mallampati 4  neck circumference: 15.25 . Nasal airflow restricted, TMJ  is evident. Retrognathia is not seen.  Bruxism marks ! . Still wears mouth guard, which fits unde nasal pillows/ nasal  ask.  Cardiovascular:  Regular rate and rhythm, 53 bpm.  Respiratory: Lungs are clear to auscultation. Skin:  Ankle edema, moderate.Trunk: BMI is 38.9 kg/m2.  The patient's posture is erect-   Neurologic exam :The patient is awake and alert, oriented to place and time.  Mood and affect are appropriate, she is happy. Cranial nerves:Pupils are equal and briskly reactive to light and accomodation.   Extraocular movements  in vertical and horizontal planes intact and without nystagmus. Visual fields by finger perimetry are intact.Hearing to finger rub intact. Facial sensation intact to fine  touch. Facial motor strength is symmetric and tongue and uvula move midline. Tongue protrusion into cheeks is strong.  Shoulder shrug was symmetrical.   The patient was advised of the nature of the diagnosed sleep disorder, the treatment options and risks for general a health and wellness arising from not treating the condition.  I spent more than 25  minutes of face to face time with the patient.  Greater than 50% of time was spent in counseling and coordination of care. We have discussed the diagnosis and differential and I answered the patient's questions.     Assessment:  After physical and neurologic examination, review of laboratory studies,  Personal review of imaging studies, reports of other /same  Imaging studies ,  Results of polysomnography/ neurophysiology testing and pre-existing records as far as provided in visit., my assessment is 20 minutes long.   1) OSA- Patients with REM dependent sleep apnea who also do not benefit from ENT surgery. She uses CPAP at 8 cm with 3 cm EPR , highly compliant - daily use time affected by shifyts work, daytime sleeping post call. . Nasal mask better than nasal pillow. (ONO was negative in 2019) . Dental device did not help as much. Her new machine  will be due December 2022. 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 I would be setting it to 8 cm water 2 cm EPR. [facility] preferred- talk to [facility] , please.   2) Xanax prescribed by PCP Alexandra Juleen Walker. She mentioned use for PTSD ( ?).    3) Wellbutrin used for appetite control- compulsive eating reduction. Main risk factor for OSA is Obesity She lost 40 pounds since October 2017 . Alexandra Walker followed her for weight loss over 18 month - she lost only 10 pounds and was d frustrated. Now on weight watchers, lost 20 pounds she gained during [place] pandemic. .  impaired fasting glucose, fatty liver, morbid obesity. Alexandra Orland Dec follows her at [facility]. These conditions are also promoted by shift work and poor sleep quality , too.   4) shift work Pharmacologist,  This is her second sleep disorder. Wellbutrin XR seems not to hinder her to go to sleep.  She will start a night shift rotation 2 days and 2 nights alternating.  5) ADHD/ ADD on Vyvanse, prescribed by PCP- Vyvanse is expensive.   Rv in 12 month with me.   Asencion Partridge Arville Postlewaite MD  08/09/2019   CC: Billey Chang, MD is now PCP>          Briscoe Deutscher, DO, MS.  [place].

## 2019-08-09 NOTE — Patient Instructions (Signed)
Sleep Apnea Sleep apnea is a condition in which breathing pauses or becomes shallow during sleep. Episodes of sleep apnea usually last 10 seconds or longer, and they may occur as many as 20 times an hour. Sleep apnea disrupts your sleep and keeps your body from getting the rest that it needs. This condition can increase your risk of certain health problems, including:  Heart attack.  Stroke.  Obesity.  Diabetes.  Heart failure.  Irregular heartbeat. What are the causes? There are three kinds of sleep apnea:  Obstructive sleep apnea. This kind is caused by a blocked or collapsed airway.  Central sleep apnea. This kind happens when the part of the brain that controls breathing does not send the correct signals to the muscles that control breathing.  Mixed sleep apnea. This is a combination of obstructive and central sleep apnea. The most common cause of this condition is a collapsed or blocked airway. An airway can collapse or become blocked if:  Your throat muscles are abnormally relaxed.  Your tongue and tonsils are larger than normal.  You are overweight.  Your airway is smaller than normal. What increases the risk? You are more likely to develop this condition if you:  Are overweight.  Smoke.  Have a smaller than normal airway.  Are elderly.  Are female.  Drink alcohol.  Take sedatives or tranquilizers.  Have a family history of sleep apnea. What are the signs or symptoms? Symptoms of this condition include:  Trouble staying asleep.  Daytime sleepiness and tiredness.  Irritability.  Loud snoring.  Morning headaches.  Trouble concentrating.  Forgetfulness.  Decreased interest in sex.  Unexplained sleepiness.  Mood swings.  Personality changes.  Feelings of depression.  Waking up often during the night to urinate.  Dry mouth.  Sore throat. How is this diagnosed? This condition may be diagnosed with:  A medical history.  A physical  exam.  A series of tests that are done while you are sleeping (sleep study). These tests are usually done in a sleep lab, but they may also be done at home. How is this treated? Treatment for this condition aims to restore normal breathing and to ease symptoms during sleep. It may involve managing health issues that can affect breathing, such as high blood pressure or obesity. Treatment may include:  Sleeping on your side.  Using a decongestant if you have nasal congestion.  Avoiding the use of depressants, including alcohol, sedatives, and narcotics.  Losing weight if you are overweight.  Making changes to your diet.  Quitting smoking.  Using a device to open your airway while you sleep, such as: ? An oral appliance. This is a custom-made mouthpiece that shifts your lower jaw forward. ? A continuous positive airway pressure (CPAP) device. This device blows air through a mask when you breathe out (exhale). ? A nasal expiratory positive airway pressure (EPAP) device. This device has valves that you put into each nostril. ? A bi-level positive airway pressure (BPAP) device. This device blows air through a mask when you breathe in (inhale) and breathe out (exhale).  Having surgery if other treatments do not work. During surgery, excess tissue is removed to create a wider airway. It is important to get treatment for sleep apnea. Without treatment, this condition can lead to:  High blood pressure.  Coronary artery disease.  In men, an inability to achieve or maintain an erection (impotence).  Reduced thinking abilities. Follow these instructions at home: Lifestyle  Make any lifestyle changes   that your health care provider recommends.  Eat a healthy, well-balanced diet.  Take steps to lose weight if you are overweight.  Avoid using depressants, including alcohol, sedatives, and narcotics.  Do not use any products that contain nicotine or tobacco, such as cigarettes,  e-cigarettes, and chewing tobacco. If you need help quitting, ask your health care provider. General instructions  Take over-the-counter and prescription medicines only as told by your health care provider.  If you were given a device to open your airway while you sleep, use it only as told by your health care provider.  If you are having surgery, make sure to tell your health care provider you have sleep apnea. You may need to bring your device with you.  Keep all follow-up visits as told by your health care provider. This is important. Contact a health care provider if:  The device that you received to open your airway during sleep is uncomfortable or does not seem to be working.  Your symptoms do not improve.  Your symptoms get worse. Get help right away if:  You develop: ? Chest pain. ? Shortness of breath. ? Discomfort in your back, arms, or stomach.  You have: ? Trouble speaking. ? Weakness on one side of your body. ? Drooping in your face. These symptoms may represent a serious problem that is an emergency. Do not wait to see if the symptoms will go away. Get medical help right away. Call your local emergency services (911 in the [place] Do not drive yourself to the hospital. Summary  Sleep apnea is a condition in which breathing pauses or becomes shallow during sleep.  The most common cause is a collapsed or blocked airway.  The goal of treatment is to restore normal breathing and to ease symptoms during sleep. This information is not intended to replace advice given to you by your health care provider. Make sure you discuss any questions you have with your health care provider. Document Revised: 10/21/2018 Document Reviewed: 12/30/2017 Elsevier Patient Education  2020 [employer] Inc.  

## 2019-08-10 ENCOUNTER — Other Ambulatory Visit: Payer: Self-pay

## 2019-08-10 DIAGNOSIS — E119 Type 2 diabetes mellitus without complications: Secondary | ICD-10-CM

## 2019-08-10 MED ORDER — TRULICITY 1.5 MG/0.5ML ~~LOC~~ SOAJ: 1.5000 mg | SUBCUTANEOUS | 3 refills | Status: DC

## 2019-08-10 MED FILL — TRULICITY 1.5 MG/0.5 ML PEN: 1.5 | 84 days supply | Qty: 6 | Fill #0

## 2019-08-10 NOTE — Telephone Encounter (Signed)
Per patient, she is doing good on the 1.5 mg dose. She is requesting for Trulicity 1.5 [id number] Pen to be sent in. Please advise.

## 2019-08-26 ENCOUNTER — Encounter: Payer: Self-pay | Admitting: Family Medicine

## 2019-08-27 ENCOUNTER — Ambulatory Visit (INDEPENDENT_AMBULATORY_CARE_PROVIDER_SITE_OTHER): Payer: 59 | Admitting: Family Medicine

## 2019-08-27 ENCOUNTER — Ambulatory Visit (INDEPENDENT_AMBULATORY_CARE_PROVIDER_SITE_OTHER): Payer: 59

## 2019-08-27 ENCOUNTER — Other Ambulatory Visit: Payer: Self-pay

## 2019-08-27 ENCOUNTER — Encounter: Payer: Self-pay | Admitting: Family Medicine

## 2019-08-27 VITALS — BP 142/90 | HR 78 | Temp 98.5°F | Resp 15 | Wt 240.0 lb

## 2019-08-27 DIAGNOSIS — R140 Abdominal distension (gaseous): Secondary | ICD-10-CM

## 2019-08-27 DIAGNOSIS — R066 Hiccough: Secondary | ICD-10-CM

## 2019-08-27 DIAGNOSIS — R1011 Right upper quadrant pain: Secondary | ICD-10-CM

## 2019-08-27 MED FILL — ROSUVASTATIN CALCIUM 10 MG: 10 | 90 days supply | Qty: 90 | Fill #1

## 2019-08-27 MED FILL — METOPROLOL SUCCINATE ER 50: 50 | 90 days supply | Qty: 90 | Fill #1

## 2019-08-27 MED FILL — LOSARTAN POTASSIUM 50 MG TA: 50 | 60 days supply | Qty: 60 | Fill #4

## 2019-08-27 NOTE — Progress Notes (Signed)
Subjective  CC:  Chief Complaint  Patient presents with  . Abdominal Pain    vomitted Wednesday morning, abdominal pain started afterwards, right side, pain comes and goes, does still have appendix, gallbladder removed    HPI: Alexandra Walker is a 64 y.o. female who presents to the office today to address the problems listed above in the chief complaint.  64 yo with multiple medical problems as documented on PL with 2-3 day h/o RUQ intermittent sharp pulsating intermittent localized pain. Started abruptly wed with single episode of abrupt vomiting. Then pulsating pain has been on and off since, although hasn't felt it for last 18 hours or so. No urinary sxs, f/c/s, nausea, further vomiting, change in bowel movements, blood in stool, flank pain or rash. No pleuritic cp. No sob. No strain. Has h/o fatty liver, diverticulosis and schatzki's ring w/o h/o PUD or symptomatic gastritis/gerd. No jaundice. Nl appetite and no change with meals.   Assessment  1. RUQ pain   2. Abdominal bloating   3. Hiccups      Plan   RUQ abdominal pain:  Improving: differential is broad and includes biliary tract disease, diverticulitis, hepatic dysfunction, PUD or other, constipation, gas etc ... as patient is improving we elect to hold off on blood work until Monday (she has a fasting lab appt at 8am), check xrays, and monitor sxs. ? Related to truicity so holding that as well. Monitor for fever or worsening pain.   Follow up: No follow-ups on file.  08/30/2019  Orders Placed This Encounter  Procedures  . DG Abd Acute W/Chest   No orders of the defined types were placed in this encounter.     I reviewed the patients updated PMH, FH, and SocHx.    Patient Active Problem List   Diagnosis Date Noted  . Obesity due to excess calories with serious comorbidity 05/24/2019    Priority: High  . Attention deficit hyperactivity disorder (ADHD), combined type 10/19/2017    Priority: High  . OSA on CPAP,  followed by Neurology/Sleep 09/26/2016    Priority: High  . Essential hypertension 01/31/2016    Priority: High  . Type 2 diabetes mellitus without complication, without long-term current use of inslin ([place]) 01/31/2016    Priority: High  . Mixed hyperlipidemia 05/10/2015    Priority: High  . Palpitations 09/19/2011    Priority: High  . Bilateral hip bursitis 05/06/2019    Priority: Medium  . Nephrolithiasis 03/29/2018    Priority: Medium  . 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HCC)     Priority: Medium  . Recurrent circadian rhythm sleep disorder, shift work type 03/31/2017    Priority: Medium  . IBS (irritable bowel syndrome), diarrhea predominant 03/17/2017    Priority: Medium  . Hepatic steatosis 09/26/2016    Priority: Medium  . Diverticulosis of colon 05/16/2018    Priority: Low  . Drug-induced pancreatitis, 2015, thought to be due to [facility] 03/29/2018    Priority: Low  . History of vitreous detachment OU, HTN retinopathy OU, High myopia OU, Cataracts OU, Vitreous floaters OU, Keratoconjunctivitis sicca OU not specified as Sjogren's 03/29/2018    Priority: Low  . History of sepsis 02/01/2016    Priority: Low  . Vitamin D deficiency, on Vit D 2000 IU daily 05/10/2015    Priority: Low  . Shifting sleep-work schedule, affecting sleep 08/09/2019  . Morbid obesity ([place]) 03/31/2017   Current Meds  Medication Sig  . aspirin 325 MG tablet Take 325 mg by mouth daily.  Marland Kitchen  Blood Glucose Monitoring Suppl (FREESTYLE LITE) DEVI Check blood sugar 2-3 times per day.  Dx:E11.21  . cholecalciferol (VITAMIN D) 1000 UNITS tablet Take 2,000 Units by mouth daily.   . cyclobenzaprine (FLEXERIL) 10 MG tablet Take 1 tablet (10 mg total) by mouth 3 (three) times daily as needed for muscle spasms.  . diclofenac sodium (VOLTAREN) 1 % GEL APPLY 2 GRAMS TOPICALLY 2 TIMES DAILY AS NEEDED. NOTE: 2 GRAMS IS EQUIVALENT TO A NICKEL SIZED AMOUNT.  . Dulaglutide (TRULICITY) 1.5 0000000 SOPN Inject 1.5 mg into the  skin once a week.  . losartan (COZAAR) 50 MG tablet TAKE 1 TABLET (50 MG TOTAL) BY MOUTH DAILY.  . metoprolol succinate (TOPROL-XL) 50 MG 24 hr tablet Take 1 tablet (50 mg total) by mouth daily. Take with or immediately following a meal.  . Omega-3 1000 MG CAPS Take 1 capsule by mouth daily.   . ondansetron (ZOFRAN ODT) 4 MG disintegrating tablet Take 1 tablet (4 mg total) by mouth every 8 (eight) hours as needed for nausea or vomiting.  . rosuvastatin (CRESTOR) 10 MG tablet Take 1 tablet (10 mg total) by mouth daily.  . Turmeric 450 MG CAPS Take 450 mg by mouth daily.     Allergies: Patient is allergic to ace inhibitors; oxycodone-acetaminophen; vicodin [hydrocodone-acetaminophen]; victoza [liraglutide]; and adhesive [tape]. Family History: Patient family history includes Coronary artery disease in her mother; Depression in her mother; Diabetes in her mother; Heart attack in her father and mother; Heart disease in her father and mother; Hyperlipidemia in her mother; Hypertension in her brother, mother, and sister; Kidney disease in her mother; Obesity in her mother; Sleep apnea in her mother. Social History:  Patient  reports that she has never smoked. She has never used smokeless tobacco. She reports current alcohol use. She reports that she does not use drugs.  Review of Systems: Constitutional: Negative for fever malaise or anorexia Cardiovascular: negative for chest pain Respiratory: negative for SOB or persistent cough Gastrointestinal: negative for abdominal pain  Objective  Vitals: BP (!) 142/90   Pulse 78   Temp 98.5 F (36.9 C) (Temporal)   Resp 15   Wt 240 lb (108.9 kg)   LMP 05/20/2008   SpO2 99%   BMI 38.74 kg/m  General: no acute distress , A&Ox3 HEENT: PEERL, conjunctiva normal, neck is supple Cardiovascular:  RRR without murmur or gallop.  Respiratory:  Good breath sounds bilaterally, CTAB with normal respiratory effort Gastrointestinal: soft, flat abdomen, mild  "discomfort" with deep palpation at RUQ but no rebound or guarding. normal active bowel sounds, no palpable masses, no hepatosplenomegaly, no appreciated hernias Skin:  Warm, no rashes     Commons side effects, risks, benefits, and alternatives for medications and treatment plan prescribed today were discussed, and the patient expressed understanding of the given instructions. Patient is instructed to call or message via MyChart if he/she has any questions or concerns regarding our treatment plan. No barriers to understanding were identified. We discussed Red Flag symptoms and signs in detail. Patient expressed understanding regarding what to do in case of urgent or emergency type symptoms.   Medication list was reconciled, printed and provided to the patient in AVS. Patient instructions and summary information was reviewed with the patient as documented in the AVS. This note was prepared with assistance of Dragon voice recognition software. Occasional wrong-word or sound-a-like substitutions may have occurred due to the inherent limitations of voice recognition software  This visit occurred during the SARS-CoV-2 public health  emergency.  Safety protocols were in place, including screening questions prior to the visit, additional usage of staff PPE, and extensive cleaning of exam room while observing appropriate contact time as indicated for disinfecting solutions.

## 2019-08-27 NOTE — Patient Instructions (Signed)
Please follow up as scheduled for your next visit with me: 08/30/2019   See you Monday fasting.  If you have any questions or concerns, please don't hesitate to send me a message via MyChart or call the office at [phone]. Thank you for visiting with [name] today! It's our pleasure caring for you.

## 2019-08-30 ENCOUNTER — Encounter: Payer: Self-pay | Admitting: Family Medicine

## 2019-08-30 ENCOUNTER — Other Ambulatory Visit: Payer: Self-pay

## 2019-08-30 ENCOUNTER — Other Ambulatory Visit: Payer: Self-pay | Admitting: Family Medicine

## 2019-08-30 ENCOUNTER — Ambulatory Visit (INDEPENDENT_AMBULATORY_CARE_PROVIDER_SITE_OTHER): Payer: 59 | Admitting: Family Medicine

## 2019-08-30 VITALS — BP 146/86 | HR 68 | Temp 97.5°F | Resp 15 | Wt 237.2 lb

## 2019-08-30 DIAGNOSIS — F902 Attention-deficit hyperactivity disorder, combined type: Secondary | ICD-10-CM

## 2019-08-30 DIAGNOSIS — I10 Essential (primary) hypertension: Secondary | ICD-10-CM | POA: Diagnosis not present

## 2019-08-30 DIAGNOSIS — K580 Irritable bowel syndrome with diarrhea: Secondary | ICD-10-CM

## 2019-08-30 DIAGNOSIS — M7062 Trochanteric bursitis, left hip: Secondary | ICD-10-CM | POA: Diagnosis not present

## 2019-08-30 DIAGNOSIS — M7061 Trochanteric bursitis, right hip: Secondary | ICD-10-CM | POA: Diagnosis not present

## 2019-08-30 DIAGNOSIS — R1011 Right upper quadrant pain: Secondary | ICD-10-CM

## 2019-08-30 DIAGNOSIS — R7303 Prediabetes: Secondary | ICD-10-CM | POA: Diagnosis not present

## 2019-08-30 DIAGNOSIS — E782 Mixed hyperlipidemia: Secondary | ICD-10-CM | POA: Diagnosis not present

## 2019-08-30 LAB — COMPREHENSIVE METABOLIC PANEL
ALT: 14 U/L (ref 0–35)
AST: 16 U/L (ref 0–37)
Albumin: 4.2 g/dL (ref 3.5–5.2)
Alkaline Phosphatase: 77 U/L (ref 39–117)
BUN: 17 mg/dL (ref 6–23)
CO2: 28 mEq/L (ref 19–32)
Calcium: 9.2 mg/dL (ref 8.4–10.5)
Chloride: 105 mEq/L (ref 96–112)
Creatinine, Ser: 0.67 mg/dL (ref 0.40–1.20)
GFR: 89.34 mL/min (ref >60.00)
Glucose, Bld: 109 mg/dL — ABNORMAL HIGH (ref 70–99)
Potassium: 4.6 mEq/L (ref 3.5–5.1)
Sodium: 140 mEq/L (ref 135–145)
Total Bilirubin: 0.5 mg/dL (ref 0.2–1.2)
Total Protein: 6.2 g/dL (ref 6.0–8.3)

## 2019-08-30 LAB — LIPID PANEL
Cholesterol: 131 mg/dL (ref 0–200)
HDL: 35.5 mg/dL — ABNORMAL LOW (ref >39.00)
LDL Cholesterol: 81 mg/dL (ref 0–99)
NonHDL: 95.02
Total CHOL/HDL Ratio: 4
Triglycerides: 72 mg/dL (ref 0.0–149.0)
VLDL: 14.4 mg/dL (ref 0.0–40.0)

## 2019-08-30 LAB — CBC WITH DIFFERENTIAL/PLATELET
Basophils Absolute: 0 10*3/uL (ref 0.0–0.1)
Basophils Relative: 0.6 % (ref 0.0–3.0)
Eosinophils Absolute: 0.2 10*3/uL (ref 0.0–0.7)
Eosinophils Relative: 4.1 % (ref 0.0–5.0)
HCT: 40.4 % (ref 36.0–46.0)
Hemoglobin: 13.4 g/dL (ref 12.0–15.0)
Lymphocytes Relative: 22.1 % (ref 12.0–46.0)
Lymphs Abs: 1.2 10*3/uL (ref 0.7–4.0)
MCHC: 33.2 g/dL (ref 30.0–36.0)
MCV: 85.6 fl (ref 78.0–100.0)
Monocytes Absolute: 0.5 10*3/uL (ref 0.1–1.0)
Monocytes Relative: 9.5 % (ref 3.0–12.0)
Neutro Abs: 3.5 10*3/uL (ref 1.4–7.7)
Neutrophils Relative %: 63.7 % (ref 43.0–77.0)
Platelets: 224 10*3/uL (ref 150.0–400.0)
RBC: 4.72 Mil/uL (ref 3.87–5.11)
RDW: 12.8 % (ref 11.5–15.5)
WBC: 5.5 10*3/uL (ref 4.0–10.5)

## 2019-08-30 LAB — POCT GLYCOSYLATED HEMOGLOBIN (HGB A1C): Hemoglobin A1C: 5.1 % (ref 4.0–5.6)

## 2019-08-30 MED ORDER — AMPHETAMINE-DEXTROAMPHETAMINE 10 MG PO TABS: 10.0000 mg | ORAL_TABLET | Freq: Every day | ORAL | 0 refills | Status: DC

## 2019-08-30 MED ORDER — LOSARTAN POTASSIUM 100 MG PO TABS: 100.0000 mg | ORAL_TABLET | Freq: Every day | ORAL | 3 refills | Status: DC

## 2019-08-30 MED FILL — LOSARTAN POTASSIUM 100 MG T: 100 | 90 days supply | Qty: 90 | Fill #0

## 2019-08-30 MED FILL — AMPHETAMINE-DEXTROAMPHETAMI: 10 | 30 days supply | Qty: 30 | Fill #0

## 2019-08-30 NOTE — Patient Instructions (Signed)
Please follow up as scheduled for your next visit with me: 11/01/2019 for your physical. Come fasting  I will release your lab results to you on your MyChart account with further instructions. Please reply with any questions.   If you have any questions or concerns, please don't hesitate to send me a message via MyChart or call the office at [phone]. Thank you for visiting with [name] today! It's our pleasure caring for you.

## 2019-08-30 NOTE — Progress Notes (Signed)
Subjective  CC:  Chief Complaint  Patient presents with  . Diabetes    <130's, randomly checking at home  . Hyperlipidemia  . Hypertension  . Bloated    abdominal pain has resolved    HPI: Alexandra Walker is a 64 y.o. female who presents to the office today for follow up of diabetes and problems listed above in the chief complaint.   Prediabetes follow up: she has been on trulicity for weight loss and prediabetes. Now ready to stop as not helping weight loss and possible side effects. No sxs of hyperglycemia. She denies exertional CP or SOB or symptomatic hypoglycemia. She denies foot sores or paresthesias.   HTN: running high on losartan and bb. Feeling well. Taking medications w/o adverse effects. No symptoms of CHF, angina; no palpitations, sob, cp or lower extremity edema. Compliant with meds.   HLD: we started crestor 3 months ago. Tolerating well. Ready for fasting lipid recheck and lfts  RUQ pain and bloating: see last note from 3 days ago. She reports sxs have resolved spontaneously. No more RUQ pain. No n/v. Had episode of diarrhea this am: "normal" for her due to IBS. Acute abdomen reviewed and colon is full of stool but no other abnl awaiting final read. Clear chest.   Obesity: diet is a challenge and she works night shift. Eating healthy at work is difficult  ADD: using left over adderall as needed and it is helfpul. vyvanse was too expensive.  Helps with work concentration and completing tasks.   Wt Readings from Last 3 Encounters:  08/30/19 237 lb 3.2 oz (107.6 kg)  08/27/19 240 lb (108.9 kg)  08/09/19 240 lb (108.9 kg)    BP Readings from Last 3 Encounters:  08/30/19 (!) 146/86  08/27/19 (!) 142/90  08/09/19 (!) 147/88    Assessment  1. Essential hypertension   2. Prediabetes   3. Irritable bowel syndrome with diarrhea   4. Attention deficit hyperactivity disorder (ADHD), combined type   5. Mixed hyperlipidemia   6. RUQ pain      Plan   PreDiabetes  is currently very well controlled. Changed dx to prediabetes. Will stop trulicity and recheck [id number] in 3 months. Rec low sugar diet and weight loss.   HTN: uncontrolled; increase ARB to 100mg  daily. Continue bb. Check renal function and electrolytes.  IBS: increased stool burden on KUB and now with diarrhea. May need to clean out colon.   ADD: restart adderall  HLD recheck fasting on crestor  RUQ pain resolved. ? Related to stool burden or IBS flare. Check labs.   Follow up: as scheduled for cpe in june. Orders Placed This Encounter  Procedures  . CBC with Differential/Platelet  . Comprehensive metabolic panel  . Lipid panel  . POCT HgB A1C   Meds ordered this encounter  Medications  . losartan (COZAAR) 100 MG tablet    Sig: Take 1 tablet (100 mg total) by mouth daily.    Dispense:  90 tablet    Refill:  3    2ND REQUEST 04/14/2019  . amphetamine-dextroamphetamine (ADDERALL) 10 MG tablet    Sig: Take 1 tablet (10 mg total) by mouth daily.    Dispense:  30 tablet    Refill:  0      Immunization History  Administered Date(s) Administered  . Influenza, High Dose Seasonal PF 01/21/2013  . Influenza,inj,Quad PF,6+ Mos 01/21/2013, 03/09/2018, 03/19/2019  . Influenza,inj,quad, With Preservative 02/18/2015  . Influenza-Unspecified 02/18/2015, 02/23/2016, 03/13/2017  .  Pneumococcal Polysaccharide-23 08/01/2008  . Rabies, IM 10/14/2011, 10/17/2011, 10/23/2011, 10/31/2011  . Rabies, intradermal 10/14/2011  . Tdap 06/08/2007    Diabetes Related Lab Review: Lab Results  Component Value Date   HGBA1C 5.1 08/30/2019   HGBA1C 5.2 05/24/2019   HGBA1C 5.9 10/22/2018    No results found for: Derl Barrow Lab Results  Component Value Date   CREATININE 0.70 05/24/2019   BUN 16 05/24/2019   NA 139 05/24/2019   K 4.3 05/24/2019   CL 104 05/24/2019   CO2 29 05/24/2019   Lab Results  Component Value Date   CHOL 197 05/24/2019   CHOL 223 (H) 10/26/2018   CHOL 137  03/30/2018   Lab Results  Component Value Date   HDL 39.20 05/24/2019   HDL 48.20 10/26/2018   HDL 49.80 03/30/2018   Lab Results  Component Value Date   LDLCALC 139 (H) 10/26/2018   LDLCALC 69 03/30/2018   LDLCALC 72 09/19/2017   Lab Results  Component Value Date   TRIG 248.0 (H) 05/24/2019   TRIG 183.0 (H) 10/26/2018   TRIG 87.0 03/30/2018   Lab Results  Component Value Date   CHOLHDL 5 05/24/2019   CHOLHDL 5 10/26/2018   CHOLHDL 3 03/30/2018   Lab Results  Component Value Date   LDLDIRECT 118.0 05/24/2019   The 10-year ASCVD risk score Mikey Bussing DC Jr., et al., 2013) is: 14.4%   Values used to calculate the score:     Age: 44 years     Sex: Female     Is Non-Hispanic African American: No     Diabetic: Yes     Tobacco smoker: No     Systolic Blood Pressure: 123456 mmHg     Is BP treated: Yes     HDL Cholesterol: 39.2 mg/dL     Total Cholesterol: 197 mg/dL I have reviewed the [place], Fam and Soc history. Patient Active Problem List   Diagnosis Date Noted  . Obesity due to excess calories with serious comorbidity 05/24/2019    Priority: High  . Attention deficit hyperactivity disorder (ADHD), combined type 10/19/2017    Priority: High  . OSA on CPAP, followed by Neurology/Sleep 09/26/2016    Priority: High  . Essential hypertension 01/31/2016    Priority: High  . Type 2 diabetes mellitus without complication, without long-term current use of inslin ([place]) 01/31/2016    Priority: High  . Mixed hyperlipidemia 05/10/2015    Priority: High  . Palpitations 09/19/2011    Priority: High    PACs controlled on BB   . Bilateral hip bursitis 05/06/2019    Priority: Medium  . Nephrolithiasis 03/29/2018    Priority: Medium    H/o recurrent pyelo/urosepsis; s/p stone retrieval and stenting.  Has nonobstructive stone that persists.  Dr. Tresa Moore   . 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HCC)     Priority: Medium  . Recurrent circadian rhythm sleep disorder, shift work type 03/31/2017     Priority: Medium  . IBS (irritable bowel syndrome), diarrhea predominant 03/17/2017    Priority: Medium  . Hepatic steatosis 09/26/2016    Priority: Medium  . Diverticulosis of colon 05/16/2018    Priority: Low  . Drug-induced pancreatitis, 2015, thought to be due to [facility] 03/29/2018    Priority: Low  . History of vitreous detachment OU, HTN retinopathy OU, High myopia OU, Cataracts OU, Vitreous floaters OU, Keratoconjunctivitis sicca OU not specified as Sjogren's 03/29/2018    Priority: Low  . History of sepsis 02/01/2016    Priority:  Low    Due to urosepsis: had acute short lived afib/psvt. Neg cardiac eval since.    . Vitamin D deficiency, on Vit D 2000 IU daily 05/10/2015    Priority: Low  . Shifting sleep-work schedule, affecting sleep 08/09/2019    Social History: Patient  reports that she has never smoked. She has never used smokeless tobacco. She reports current alcohol use. She reports that she does not use drugs.  Review of Systems: Ophthalmic: negative for eye pain, loss of vision or double vision Cardiovascular: negative for chest pain Respiratory: negative for SOB or persistent cough Gastrointestinal: negative for abdominal pain Genitourinary: negative for dysuria or gross hematuria MSK: negative for foot lesions Neurologic: negative for weakness or gait disturbance  Objective  Vitals: BP (!) 146/86   Pulse 68   Temp (!) 97.5 F (36.4 C) (Temporal)   Resp 15   Wt 237 lb 3.2 oz (107.6 kg)   LMP 05/20/2008   SpO2 98%   BMI 38.29 kg/m  General: well appearing, no acute distress  Psych:  Alert and oriented, normal mood and affect HEENT:  Normocephalic, atraumatic, moist mucous membranes, supple neck  Cardiovascular:  Nl S1 and S2, RRR without murmur, gallop or rub. no edema Respiratory:  Good breath sounds bilaterally, CTAB with normal effort, no rales Gastrointestinal: normal BS, soft, nontender Skin:  Warm, no rashes     Diabetic education: ongoing  education regarding chronic disease management for diabetes was given today. We continue to reinforce the ABC's of diabetic management: A1c (<7 or 8 dependent upon patient), tight blood pressure control, and cholesterol management with goal LDL < 100 minimally. We discuss diet strategies, exercise recommendations, medication options and possible side effects. At each visit, we review recommended immunizations and preventive care recommendations for diabetics and stress that good diabetic control can prevent other problems. See below for this patient's data.    Commons side effects, risks, benefits, and alternatives for medications and treatment plan prescribed today were discussed, and the patient expressed understanding of the given instructions. Patient is instructed to call or message via MyChart if he/she has any questions or concerns regarding our treatment plan. No barriers to understanding were identified. We discussed Red Flag symptoms and signs in detail. Patient expressed understanding regarding what to do in case of urgent or emergency type symptoms.   Medication list was reconciled, printed and provided to the patient in AVS. Patient instructions and summary information was reviewed with the patient as documented in the AVS. This note was prepared with assistance of Dragon voice recognition software. Occasional wrong-word or sound-a-like substitutions may have occurred due to the inherent limitations of voice recognition software  This visit occurred during the SARS-CoV-2 public health emergency.  Safety protocols were in place, including screening questions prior to the visit, additional usage of staff PPE, and extensive cleaning of exam room while observing appropriate contact time as indicated for disinfecting solutions.

## 2019-10-17 ENCOUNTER — Encounter: Payer: Self-pay | Admitting: Family Medicine

## 2019-10-31 ENCOUNTER — Encounter: Payer: Self-pay | Admitting: Family Medicine

## 2019-11-01 ENCOUNTER — Ambulatory Visit (INDEPENDENT_AMBULATORY_CARE_PROVIDER_SITE_OTHER): Payer: 59 | Admitting: Family Medicine

## 2019-11-01 ENCOUNTER — Encounter: Payer: 59 | Admitting: Family Medicine

## 2019-11-01 ENCOUNTER — Encounter: Payer: Self-pay | Admitting: Family Medicine

## 2019-11-01 ENCOUNTER — Other Ambulatory Visit: Payer: Self-pay

## 2019-11-01 VITALS — BP 130/72 | HR 58 | Temp 97.5°F | Ht 66.0 in | Wt 241.2 lb

## 2019-11-01 DIAGNOSIS — I10 Essential (primary) hypertension: Secondary | ICD-10-CM

## 2019-11-01 DIAGNOSIS — E782 Mixed hyperlipidemia: Secondary | ICD-10-CM | POA: Diagnosis not present

## 2019-11-01 DIAGNOSIS — Z0000 Encounter for general adult medical examination without abnormal findings: Secondary | ICD-10-CM | POA: Diagnosis not present

## 2019-11-01 DIAGNOSIS — R7301 Impaired fasting glucose: Secondary | ICD-10-CM | POA: Diagnosis not present

## 2019-11-01 DIAGNOSIS — K760 Fatty (change of) liver, not elsewhere classified: Secondary | ICD-10-CM

## 2019-11-01 DIAGNOSIS — Z23 Encounter for immunization: Secondary | ICD-10-CM

## 2019-11-01 DIAGNOSIS — F902 Attention-deficit hyperactivity disorder, combined type: Secondary | ICD-10-CM | POA: Diagnosis not present

## 2019-11-01 LAB — POCT GLYCOSYLATED HEMOGLOBIN (HGB A1C): Hemoglobin A1C: 5.6 % (ref 4.0–5.6)

## 2019-11-01 NOTE — Progress Notes (Signed)
Subjective  Chief Complaint  Patient presents with  . Annual Exam    She is fasting.  Marland Kitchen Hyperlipidemia  . Hypertension  . ADHD  . VItamin D Deficiency    HPI: Alexandra Walker is a 64 y.o. female who presents to [facility] today for a Female Wellness Visit. She also has the concerns and/or needs as listed above in the chief complaint. These will be addressed in addition to the Health Maintenance Visit.   Wellness Visit: annual visit with health maintenance review and exam without Pap   HM: up to date on screens. Imms: due tdap. Working and active. Happy.  Chronic disease f/u and/or acute problem visit: (deemed necessary to be done in addition to the wellness visit):  HTN and HLD: both are controlled on meds. See labs from august. No cp or sob. No edema  Palpitations controlled on BB  ADD: using rare and intermittent Adderall. Controlled  IFG: fastings run 110-120s but nl postprandials. Stopped trulicity 3 months ago. Feels hungry but continuing to work on diet and weight loss  Hepatic steatosis: released from liver clinic. Has been stable.   Immunization History  Administered Date(s) Administered  . Influenza, High Dose Seasonal PF 01/21/2013  . Influenza,inj,Quad PF,6+ Mos 01/21/2013, 03/09/2018, 03/19/2019  . Influenza,inj,quad, With Preservative 02/18/2015  . Influenza-Unspecified 02/18/2015, 02/23/2016, 03/13/2017  . PFIZER SARS-COV-2 Vaccination 05/15/2019, 06/05/2019  . Pneumococcal Polysaccharide-23 08/01/2008  . Rabies, IM 10/14/2011, 10/17/2011, 10/23/2011, 10/31/2011  . Rabies, intradermal 10/14/2011  . Tdap 06/08/2007, 11/01/2019    Diabetes Related Lab Review: Lab Results  Component Value Date   HGBA1C 5.6 11/01/2019   HGBA1C 5.1 08/30/2019   HGBA1C 5.2 05/24/2019    No results found for: Derl Barrow Lab Results  Component Value Date   CREATININE 0.67 08/30/2019   BUN 17 08/30/2019   NA 140 08/30/2019   K 4.6  08/30/2019   CL 105 08/30/2019   CO2 28 08/30/2019   Lab Results  Component Value Date   CHOL 131 08/30/2019   CHOL 197 05/24/2019   CHOL 223 (H) 10/26/2018   Lab Results  Component Value Date   HDL 35.50 (L) 08/30/2019   HDL 39.20 05/24/2019   HDL 48.20 10/26/2018   Lab Results  Component Value Date   LDLCALC 81 08/30/2019   LDLCALC 139 (H) 10/26/2018   LDLCALC 69 03/30/2018   Lab Results  Component Value Date   TRIG 72.0 08/30/2019   TRIG 248.0 (H) 05/24/2019   TRIG 183.0 (H) 10/26/2018   Lab Results  Component Value Date   CHOLHDL 4 08/30/2019   CHOLHDL 5 05/24/2019   CHOLHDL 5 10/26/2018   Lab Results  Component Value Date   LDLDIRECT 118.0 05/24/2019   The 10-year ASCVD risk score Mikey Bussing DC Jr., et al., 2013) is: 9.3%   Values used to calculate the score:     Age: 75 years     Sex: Female     Is Non-Hispanic African American: No     Diabetic: Yes     Tobacco smoker: No     Systolic Blood Pressure: 924 mmHg     Is BP treated: Yes     HDL Cholesterol: 35.5 mg/dL     Total Cholesterol: 131 mg/dL  BP Readings from Last 3 Encounters:  11/01/19 130/72  08/30/19 (!) 146/86  08/27/19 (!) 142/90   Wt Readings from Last 3 Encounters:  11/01/19 241 lb 3.2 oz (109.4 kg)  08/30/19 237  lb 3.2 oz (107.6 kg)  08/27/19 240 lb (108.9 kg)    Health Maintenance  Topic Date Due  . INFLUENZA VACCINE  12/19/2019  . MAMMOGRAM  05/02/2020  . COLONOSCOPY  05/18/2022  . TETANUS/TDAP  10/31/2029  . COVID-19 Vaccine  Completed  . Hepatitis C Screening  Completed  . HIV Screening  Completed    Assessment  1. Annual physical exam   2. Attention deficit hyperactivity disorder (ADHD), combined type   3. Essential hypertension   4. Mixed hyperlipidemia   5. Class 2 severe obesity due to excess calories with serious comorbidity in adult, unspecified BMI ([place])   6. IFG (impaired fasting glucose)   7. Hepatic steatosis      Plan  Female Wellness Visit:  Age  appropriate Health Maintenance and Prevention measures were discussed with patient. Included topics are cancer screening recommendations, ways to keep healthy (see AVS) including dietary and exercise recommendations, regular eye and dental care, use of seat belts, and avoidance of moderate alcohol use and tobacco use.   BMI: discussed patient's BMI and encouraged positive lifestyle modifications to help get to or maintain a target BMI.  HM needs and immunizations were addressed and ordered. See below for orders. See HM and immunization section for updates.  Routine labs and screening tests ordered including cmp, cbc and lipids where appropriate.  Discussed recommendations regarding Vit D and calcium supplementation (see AVS)  Chronic disease management visit and/or acute problem visit:  IFG: stable off meds. Diet and exercise. Nl renal fxn and lytes  HTN is controlled on arb and bb  Palpitations controlled on bb  Fatty liver: low fat diet encouraged  ADD: doing fine with intermittent use of meds.   Follow up: Return in about 6 months (around 05/02/2020) for recheck sugars.  Orders Placed This Encounter  Procedures  . Tdap vaccine greater than or equal to 7yo IM  . POCT glycosylated hemoglobin (Hb A1C)   No orders of the defined types were placed in this encounter.     Lifestyle: Body mass index is 38.93 kg/m. Wt Readings from Last 3 Encounters:  11/01/19 241 lb 3.2 oz (109.4 kg)  08/30/19 237 lb 3.2 oz (107.6 kg)  08/27/19 240 lb (108.9 kg)    Patient Active Problem List   Diagnosis Date Noted  . Obesity due to excess calories with serious comorbidity 05/24/2019    Priority: High  . Attention deficit hyperactivity disorder (ADHD), combined type 10/19/2017    Priority: High  . OSA on CPAP, followed by Neurology/Sleep 09/26/2016    Priority: High  . Essential hypertension 01/31/2016    Priority: High  . IFG (impaired fasting glucose) 01/31/2016    Priority: High  .  Mixed hyperlipidemia 05/10/2015    Priority: High  . Palpitations 09/19/2011    Priority: High    PACs controlled on BB   . Bilateral hip bursitis 05/06/2019    Priority: Medium  . Nephrolithiasis 03/29/2018    Priority: Medium    H/o recurrent pyelo/urosepsis; s/p stone retrieval and stenting.  Has nonobstructive stone that persists.  Dr. Tresa Moore   . 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HCC)     Priority: Medium  . Recurrent circadian rhythm sleep disorder, shift work type 03/31/2017    Priority: Medium  . IBS (irritable bowel syndrome), diarrhea predominant 03/17/2017    Priority: Medium  . Hepatic steatosis 09/26/2016    Priority: Medium  . Diverticulosis of colon 05/16/2018    Priority: Low  . Drug-induced pancreatitis, 2015,  thought to be due to [facility] 03/29/2018    Priority: Low  . History of vitreous detachment OU, HTN retinopathy OU, High myopia OU, Cataracts OU, Vitreous floaters OU, Keratoconjunctivitis sicca OU not specified as Sjogren's 03/29/2018    Priority: Low  . History of sepsis 02/01/2016    Priority: Low    Due to urosepsis: had acute short lived afib/psvt. Neg cardiac eval since.    . Vitamin D deficiency, on Vit D 2000 IU daily 05/10/2015    Priority: Low  . Shifting sleep-work schedule, affecting sleep 08/09/2019   Health Maintenance  Topic Date Due  . INFLUENZA VACCINE  12/19/2019  . MAMMOGRAM  05/02/2020  . COLONOSCOPY  05/18/2022  . TETANUS/TDAP  10/31/2029  . COVID-19 Vaccine  Completed  . Hepatitis C Screening  Completed  . HIV Screening  Completed   Immunization History  Administered Date(s) Administered  . Influenza, High Dose Seasonal PF 01/21/2013  . Influenza,inj,Quad PF,6+ Mos 01/21/2013, 03/09/2018, 03/19/2019  . Influenza,inj,quad, With Preservative 02/18/2015  . Influenza-Unspecified 02/18/2015, 02/23/2016, 03/13/2017  . PFIZER SARS-COV-2 Vaccination 05/15/2019, 06/05/2019  . Pneumococcal Polysaccharide-23 08/01/2008  . Rabies, IM  10/14/2011, 10/17/2011, 10/23/2011, 10/31/2011  . Rabies, intradermal 10/14/2011  . Tdap 06/08/2007, 11/01/2019   We updated and reviewed the patient's past history in detail and it is documented below. Allergies: Patient is allergic to ace inhibitors, oxycodone-acetaminophen, vicodin [hydrocodone-acetaminophen], victoza [liraglutide], and adhesive [tape]. Past Medical History Patient  has a past medical history of ADD (attention deficit disorder), Allergy, 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[place]), Chronic depressive disorder, Circadian rhythm sleep disorder, shift work, DDD (degenerative disc disease), lumbosacral (05/2012), Diabetes ([place]), Diverticulosis, GERD (gastroesophageal reflux disease), Hepatic steatosis, History of hiatal hernia (2017), HLA B27 (HLA B27 positive), pancreatitis, Hyperlipidemia, Hypertension, IBS (irritable bowel syndrome), Lumbar scoliosis (2010), Migraine headache without aura (05/10/2015), Nephrolithiasis (2014), Obesity due to excess calories with serious comorbidity (05/24/2019), Right bundle branch block, Sepsis ([place]) (01/2016), and Vitamin D deficiency. Past Surgical History Patient  has a past surgical history that includes Colonoscopy w/ biopsies (multiple); Esophagogastroduodenoscopy (multiple); Knee arthroscopy (Left, 11/2008); Dilation and curettage of uterus (07,11); Cervical cone biopsy; Mass excision (Left, 12/24/2012); Diagnostic laparoscopy; Cholecystectomy (N/A, 10/26/2014); Bladder surgery; Laparoscopic vaginal hysterectomy with salpingo oophorectomy (Bilateral, 10/03/2015); LEFT FINGER SURGERY; Breast biopsy (Left); Eye surgery; LASIK (1990); Dupuytren contracture release; Liver biopsy; Photorefractive keratotomy (1994); Cystoscopy with retrograde pyelogram, ureteroscopy and stent placement (Left, 06/26/2018); and Holmium laser application (Left, 06/27/3660). Family History: Patient family history includes Coronary artery disease in her mother; Depression in her mother; Diabetes  in her mother; Heart attack in her father and mother; Heart disease in her father and mother; Hyperlipidemia in her mother; Hypertension in her brother, mother, and sister; Kidney disease in her mother; Obesity in her mother; Sleep apnea in her mother. Social History:  Patient  reports that she has never smoked. She has never used smokeless tobacco. She reports current alcohol use. She reports that she does not use drugs.  Review of Systems: Constitutional: negative for fever or malaise Ophthalmic: negative for photophobia, double vision or loss of vision Cardiovascular: negative for chest pain, dyspnea on exertion, or new LE swelling Respiratory: negative for SOB or persistent cough Gastrointestinal: negative for abdominal pain, change in bowel habits or melena Genitourinary: negative for dysuria or gross hematuria, no abnormal uterine bleeding or disharge Musculoskeletal: negative for new gait disturbance or muscular weakness Integumentary: negative for new or persistent rashes, no breast lumps Neurological: negative for TIA or stroke symptoms Psychiatric: negative for SI  or delusions Allergic/Immunologic: negative for hives  Patient Care Team    Relationship Specialty Notifications Start End  Leamon Arnt, MD PCP - General Family Medicine  04/07/19   Roseanne Kaufman, MD Consulting Physician Orthopedic Surgery  02/11/19   Burnell Blanks, MD Consulting Physician Cardiology  05/24/19   Cornelius Moras, NP Referring Physician Gastroenterology  05/24/19   Dohmeier, Asencion Partridge, MD Consulting Physician Neurology  05/24/19   Alexis Frock, MD Consulting Physician Urology  05/24/19   Gatha Mayer, MD Consulting Physician Gastroenterology  05/24/19     Objective  Vitals: BP 130/72   Pulse (!) 58   Temp (!) 97.5 F (36.4 C) (Temporal)   Ht '5\' 6"'  (1.676 m)   Wt 241 lb 3.2 oz (109.4 kg)   LMP 05/20/2008   SpO2 97%   BMI 38.93 kg/m  General:  Well developed, well nourished, no acute  distress  Psych:  Alert and orientedx3,normal mood and affect HEENT:  Normocephalic, atraumatic, non-icteric sclera,  supple neck without adenopathy, mass or thyromegaly Cardiovascular:  Normal S1, S2, RRR without gallop, rub or murmur Respiratory:  Good breath sounds bilaterally, CTAB with normal respiratory effort Gastrointestinal: normal bowel sounds, soft, non-tender, no noted masses. No HSM MSK: no deformities, contusions. Joints are without erythema or swelling.  Skin:  Warm, PI rash on abd and chest Neurologic:    Mental status is normal. CN 2-11 are normal. Gross motor and sensory exams are normal. Normal gait. No tremor Breast Exam: No mass, skin retraction or nipple discharge is appreciated in either breast. No axillary adenopathy. Fibrocystic changes are not noted     Commons side effects, risks, benefits, and alternatives for medications and treatment plan prescribed today were discussed, and the patient expressed understanding of the given instructions. Patient is instructed to call or message via MyChart if he/she has any questions or concerns regarding our treatment plan. No barriers to understanding were identified. We discussed Red Flag symptoms and signs in detail. Patient expressed understanding regarding what to do in case of urgent or emergency type symptoms.   Medication list was reconciled, printed and provided to the patient in AVS. Patient instructions and summary information was reviewed with the patient as documented in the AVS. This note was prepared with assistance of Dragon voice recognition software. Occasional wrong-word or sound-a-like substitutions may have occurred due to the inherent limitations of voice recognition software  This visit occurred during the SARS-CoV-2 public health emergency.  Safety protocols were in place, including screening questions prior to the visit, additional usage of staff PPE, and extensive cleaning of exam room while observing  appropriate contact time as indicated for disinfecting solutions.

## 2019-11-01 NOTE — Patient Instructions (Signed)
Please return in 6 months to recheck sugars.    If you have any questions or concerns, please don't hesitate to send me a message via MyChart or call the office at [phone]. Thank you for visiting with [name] today! It's our pleasure caring for you.  Today you were given your Tdap vaccination.

## 2019-11-02 DIAGNOSIS — H16223 Keratoconjunctivitis sicca, not specified as Sjogren's, bilateral: Secondary | ICD-10-CM | POA: Diagnosis not present

## 2019-11-02 DIAGNOSIS — H52203 Unspecified astigmatism, bilateral: Secondary | ICD-10-CM | POA: Diagnosis not present

## 2019-11-02 DIAGNOSIS — H524 Presbyopia: Secondary | ICD-10-CM | POA: Diagnosis not present

## 2019-11-02 DIAGNOSIS — H5203 Hypermetropia, bilateral: Secondary | ICD-10-CM | POA: Diagnosis not present

## 2019-11-02 DIAGNOSIS — H2513 Age-related nuclear cataract, bilateral: Secondary | ICD-10-CM | POA: Diagnosis not present

## 2019-11-02 DIAGNOSIS — R7303 Prediabetes: Secondary | ICD-10-CM | POA: Diagnosis not present

## 2019-11-02 DIAGNOSIS — Z7984 Long term (current) use of oral hypoglycemic drugs: Secondary | ICD-10-CM | POA: Diagnosis not present

## 2019-11-02 DIAGNOSIS — H43393 Other vitreous opacities, bilateral: Secondary | ICD-10-CM | POA: Diagnosis not present

## 2019-11-02 DIAGNOSIS — Z83511 Family history of glaucoma: Secondary | ICD-10-CM | POA: Diagnosis not present

## 2019-11-05 DIAGNOSIS — M25562 Pain in left knee: Secondary | ICD-10-CM | POA: Diagnosis not present

## 2019-11-15 DIAGNOSIS — M25562 Pain in left knee: Secondary | ICD-10-CM | POA: Diagnosis not present

## 2019-11-23 DIAGNOSIS — M25562 Pain in left knee: Secondary | ICD-10-CM | POA: Diagnosis not present

## 2019-11-23 DIAGNOSIS — M1712 Unilateral primary osteoarthritis, left knee: Secondary | ICD-10-CM | POA: Diagnosis not present

## 2019-11-26 DIAGNOSIS — M1712 Unilateral primary osteoarthritis, left knee: Secondary | ICD-10-CM | POA: Diagnosis not present

## 2019-11-26 DIAGNOSIS — M25562 Pain in left knee: Secondary | ICD-10-CM | POA: Diagnosis not present

## 2019-11-30 MED FILL — CYCLOBENZAPRINE HCL 10 MG T: 10 | 10 days supply | Qty: 30 | Fill #1

## 2019-11-30 MED FILL — ROSUVASTATIN CALCIUM 10 MG: 10 | 90 days supply | Qty: 90 | Fill #2

## 2019-11-30 MED FILL — METOPROLOL SUCCINATE ER 50: 50 | 90 days supply | Qty: 90 | Fill #2

## 2019-11-30 MED FILL — LOSARTAN POTASSIUM 100 MG T: 100 | 90 days supply | Qty: 90 | Fill #1

## 2019-12-30 ENCOUNTER — Encounter: Payer: Self-pay | Admitting: Family Medicine

## 2020-01-03 DIAGNOSIS — N200 Calculus of kidney: Secondary | ICD-10-CM | POA: Diagnosis not present

## 2020-01-03 DIAGNOSIS — D229 Melanocytic nevi, unspecified: Secondary | ICD-10-CM | POA: Diagnosis not present

## 2020-01-03 DIAGNOSIS — N390 Urinary tract infection, site not specified: Secondary | ICD-10-CM | POA: Diagnosis not present

## 2020-01-03 DIAGNOSIS — B9620 Unspecified Escherichia coli [E. coli] as the cause of diseases classified elsewhere: Secondary | ICD-10-CM | POA: Diagnosis not present

## 2020-01-03 DIAGNOSIS — N952 Postmenopausal atrophic vaginitis: Secondary | ICD-10-CM | POA: Diagnosis not present

## 2020-01-03 DIAGNOSIS — N3020 Other chronic cystitis without hematuria: Secondary | ICD-10-CM | POA: Diagnosis not present

## 2020-01-03 DIAGNOSIS — Z1283 Encounter for screening for malignant neoplasm of skin: Secondary | ICD-10-CM | POA: Diagnosis not present

## 2020-02-28 ENCOUNTER — Other Ambulatory Visit: Payer: Self-pay | Admitting: Family Medicine

## 2020-02-28 DIAGNOSIS — I491 Atrial premature depolarization: Secondary | ICD-10-CM

## 2020-02-28 DIAGNOSIS — E1159 Type 2 diabetes mellitus with other circulatory complications: Secondary | ICD-10-CM

## 2020-02-28 DIAGNOSIS — I152 Hypertension secondary to endocrine disorders: Secondary | ICD-10-CM

## 2020-02-28 MED FILL — ROSUVASTATIN CALCIUM 10 MG: 10 | 90 days supply | Qty: 90 | Fill #3

## 2020-02-28 MED FILL — METOPROLOL SUCCINATE ER 50: 50 | 90 days supply | Qty: 90 | Fill #0

## 2020-02-29 ENCOUNTER — Ambulatory Visit: Payer: 59

## 2020-04-10 ENCOUNTER — Other Ambulatory Visit (HOSPITAL_BASED_OUTPATIENT_CLINIC_OR_DEPARTMENT_OTHER): Payer: Self-pay

## 2020-04-10 MED FILL — DEXAMETHASONE 0.5 MG/5 ML E: 0.5 | 25 days supply | Qty: 500 | Fill #0

## 2020-04-10 MED FILL — LOSARTAN POTASSIUM 100 MG T: 100 | 90 days supply | Qty: 90 | Fill #2

## 2020-04-25 ENCOUNTER — Encounter: Payer: Self-pay | Admitting: Family Medicine

## 2020-04-25 NOTE — Telephone Encounter (Signed)
Looks like has appt with me on the 17th and 20th was cancelled. This is a bp and sugar follow up; no prior labs needed. No lab appt needed. thanks

## 2020-05-05 ENCOUNTER — Ambulatory Visit: Payer: 59 | Admitting: Family Medicine

## 2020-05-05 ENCOUNTER — Other Ambulatory Visit: Payer: Self-pay

## 2020-05-05 ENCOUNTER — Encounter: Payer: Self-pay | Admitting: Family Medicine

## 2020-05-05 VITALS — BP 126/78 | HR 60 | Temp 98.0°F | Ht 68.0 in | Wt 252.2 lb

## 2020-05-05 DIAGNOSIS — R002 Palpitations: Secondary | ICD-10-CM | POA: Diagnosis not present

## 2020-05-05 DIAGNOSIS — I10 Essential (primary) hypertension: Secondary | ICD-10-CM

## 2020-05-05 DIAGNOSIS — F902 Attention-deficit hyperactivity disorder, combined type: Secondary | ICD-10-CM

## 2020-05-05 DIAGNOSIS — R7301 Impaired fasting glucose: Secondary | ICD-10-CM | POA: Diagnosis not present

## 2020-05-05 DIAGNOSIS — M722 Plantar fascial fibromatosis: Secondary | ICD-10-CM

## 2020-05-05 LAB — POCT GLYCOSYLATED HEMOGLOBIN (HGB A1C): Hemoglobin A1C: 5.5 % (ref 4.0–5.6)

## 2020-05-05 NOTE — Patient Instructions (Addendum)
Please return in 6 months for your annual complete physical; please come fasting.  I will release your lab results to you on your MyChart account with further instructions. Please reply with any questions.   Make an appointment with ortho for your plantar fasciitis  Please schedule your mammogram.   If you have any questions or concerns, please don't hesitate to send me a message via MyChart or call the office at [phone]. Thank you for visiting with [name] today! It's our pleasure caring for you.

## 2020-05-05 NOTE — Progress Notes (Signed)
Subjective  CC:  Chief Complaint  Patient presents with  . impaired fasting glucose  . Plantar Fasciitis    Right foot  . Health Maintenance    Patient aware that mammogram is due, states she will schedule appt at work  . Hypertension  . ADHD    HPI: Alexandra Walker is a 64 y.o. female who presents to the office today to address the problems listed above in the chief complaint.  Hypertension f/u: Control is good . Pt reports she is doing well. taking medications as instructed, no medication side effects noted, no TIAs, no chest pain on exertion, no dyspnea on exertion, no swelling of ankles. She denies adverse effects from his BP medications. Compliance with medication is good.   ADD : uses rare low dose albuterol. Worries it will raise bp. Does well.   Obesity: on weight watchers now. Diet is improved. Hasn't been able to exercise due to heel pain. Appetite at night is high. Works 3 night shifts in a row at work.   Heel pain: x almost a year. Has tried all conservative care measures with only intermittent relief.   IFG: reports her fastings range high but a1cs are always normal.   Palpitations are well controlled on BB  Assessment  1. IFG (impaired fasting glucose)   2. Attention deficit hyperactivity disorder (ADHD), combined type   3. Essential hypertension   4. Class 2 severe obesity due to excess calories with serious comorbidity in adult, unspecified BMI (HCC)   5. Palpitations   6. Plantar fasciitis, right      Plan    Hypertension f/u: BP control is fairly well controlled. Continue same meds. Discussed possible mild effects of low dose adderall on bp.   ADD: recommend using adderall to treat her add symptoms with goal being good control. Will monitor bp and adjust meds if needed but doubt it will change much. Mild appetite suppressant effect as well.   Obesity: weight watchers, rec strength training as well  PF: rec ortho appt. Pt to schedule  IFG f/u: low  a1c with elevated reported fastings. Check glucose and lab a1c today.   HM: needs mammo. Recommend pt schedule.   Education regarding management of these chronic disease states was given. Management strategies discussed on successive visits include dietary and exercise recommendations, goals of achieving and maintaining IBW, and lifestyle modifications aiming for adequate sleep and minimizing stressors.   Follow up: 6 months for cpe  Orders Placed This Encounter  Procedures  . Basic metabolic panel  . Hemoglobin A1c  . POCT HgB A1C   No orders of the defined types were placed in this encounter.     BP Readings from Last 3 Encounters:  05/05/20 126/78  11/01/19 130/72  08/30/19 (!) 146/86   Wt Readings from Last 3 Encounters:  05/05/20 252 lb 3.2 oz (114.4 kg)  11/01/19 241 lb 3.2 oz (109.4 kg)  08/30/19 237 lb 3.2 oz (107.6 kg)    Lab Results  Component Value Date   CHOL 131 08/30/2019   CHOL 197 05/24/2019   CHOL 223 (H) 10/26/2018   Lab Results  Component Value Date   HDL 35.50 (L) 08/30/2019   HDL 39.20 05/24/2019   HDL 48.20 10/26/2018   Lab Results  Component Value Date   LDLCALC 81 08/30/2019   LDLCALC 139 (H) 10/26/2018   LDLCALC 69 03/30/2018   Lab Results  Component Value Date   TRIG 72.0 08/30/2019   TRIG 248.0 (  H) 05/24/2019   TRIG 183.0 (H) 10/26/2018   Lab Results  Component Value Date   CHOLHDL 4 08/30/2019   CHOLHDL 5 05/24/2019   CHOLHDL 5 10/26/2018   Lab Results  Component Value Date   LDLDIRECT 118.0 05/24/2019   Lab Results  Component Value Date   CREATININE 0.67 08/30/2019   BUN 17 08/30/2019   NA 140 08/30/2019   K 4.6 08/30/2019   CL 105 08/30/2019   CO2 28 08/30/2019    The 10-year ASCVD risk score Mikey Bussing DC Jr., et al., 2013) is: 9.6%   Values used to calculate the score:     Age: 64 years     Sex: Female     Is Non-Hispanic African American: No     Diabetic: Yes     Tobacco smoker: No     Systolic Blood Pressure:  126 mmHg     Is BP treated: Yes     HDL Cholesterol: 35.5 mg/dL     Total Cholesterol: 131 mg/dL  I reviewed the patients updated PMH, FH, and SocHx.    Patient Active Problem List   Diagnosis Date Noted  . Obesity due to excess calories with serious comorbidity 05/24/2019    Priority: High  . Attention deficit hyperactivity disorder (ADHD), combined type 10/19/2017    Priority: High  . OSA on CPAP, followed by Neurology/Sleep 09/26/2016    Priority: High  . Essential hypertension 01/31/2016    Priority: High  . IFG (impaired fasting glucose) 01/31/2016    Priority: High  . Mixed hyperlipidemia 05/10/2015    Priority: High  . Palpitations 09/19/2011    Priority: High  . Bilateral hip bursitis 05/06/2019    Priority: Medium  . Nephrolithiasis 03/29/2018    Priority: Medium  . 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HCC)     Priority: Medium  . Recurrent circadian rhythm sleep disorder, shift work type 03/31/2017    Priority: Medium  . IBS (irritable bowel syndrome), diarrhea predominant 03/17/2017    Priority: Medium  . Hepatic steatosis 09/26/2016    Priority: Medium  . Diverticulosis of colon 05/16/2018    Priority: Low  . Drug-induced pancreatitis, 2015, thought to be due to [facility] 03/29/2018    Priority: Low  . History of vitreous detachment OU, HTN retinopathy OU, High myopia OU, Cataracts OU, Vitreous floaters OU, Keratoconjunctivitis sicca OU not specified as Sjogren's 03/29/2018    Priority: Low  . History of sepsis 02/01/2016    Priority: Low  . Vitamin D deficiency, on Vit D 2000 IU daily 05/10/2015    Priority: Low  . Shifting sleep-work schedule, affecting sleep 08/09/2019    Allergies: Ace inhibitors, Oxycodone-acetaminophen, Vicodin [hydrocodone-acetaminophen], Victoza [liraglutide], and Adhesive [tape]  Social History: Patient  reports that she has never smoked. She has never used smokeless tobacco. She reports current alcohol use. She reports that she does not use  drugs.  Current Meds  Medication Sig  . amphetamine-dextroamphetamine (ADDERALL) 10 MG tablet Take 1 tablet (10 mg total) by mouth daily.  Marland Kitchen aspirin 325 MG tablet Take 325 mg by mouth daily.  . Blood Glucose Monitoring Suppl (FREESTYLE LITE) DEVI Check blood sugar 2-3 times per day.  Dx:E11.21  . cholecalciferol (VITAMIN D) 1000 UNITS tablet Take 2,000 Units by mouth daily.   . cyclobenzaprine (FLEXERIL) 10 MG tablet Take 1 tablet (10 mg total) by mouth 3 (three) times daily as needed for muscle spasms.  . diclofenac sodium (VOLTAREN) 1 % GEL APPLY 2 GRAMS TOPICALLY 2 TIMES DAILY  AS NEEDED. NOTE: 2 GRAMS IS EQUIVALENT TO A NICKEL SIZED AMOUNT.  Marland Kitchen losartan (COZAAR) 100 MG tablet Take 1 tablet (100 mg total) by mouth daily.  . metoprolol succinate (TOPROL-XL) 50 MG 24 hr tablet TAKE 1 TABLET BY MOUTH DAILY WITH OR IMMEDIATELY FOLLOWING A MEAL  . Omega-3 1000 MG CAPS Take 1 capsule by mouth daily.   . rosuvastatin (CRESTOR) 10 MG tablet Take 1 tablet (10 mg total) by mouth daily.  . Turmeric 450 MG CAPS Take 450 mg by mouth daily.     Review of Systems: Cardiovascular: negative for chest pain, palpitations, leg swelling, orthopnea Respiratory: negative for SOB, wheezing or persistent cough Gastrointestinal: negative for abdominal pain Genitourinary: negative for dysuria or gross hematuria  Objective  Vitals: BP 126/78   Pulse 60   Temp 98 F (36.7 C) (Temporal)   Ht 5\' 8"  (1.727 m)   Wt 252 lb 3.2 oz (114.4 kg)   LMP 05/20/2008   SpO2 97%   BMI 38.35 kg/m  General: no acute distress  Psych:  Alert and oriented, normal mood and affect HEENT:  Normocephalic, atraumatic, supple neck  Cardiovascular:  RRR without murmur. no edema Respiratory:  Good breath sounds bilaterally, CTAB with normal respiratory effort   Commons side effects, risks, benefits, and alternatives for medications and treatment plan prescribed today were discussed, and the patient expressed understanding of the  given instructions. Patient is instructed to call or message via MyChart if he/she has any questions or concerns regarding our treatment plan. No barriers to understanding were identified. We discussed Red Flag symptoms and signs in detail. Patient expressed understanding regarding what to do in case of urgent or emergency type symptoms.   Medication list was reconciled, printed and provided to the patient in AVS. Patient instructions and summary information was reviewed with the patient as documented in the AVS. This note was prepared with assistance of Dragon voice recognition software. Occasional wrong-word or sound-a-like substitutions may have occurred due to the inherent limitations of voice recognition software  This visit occurred during the SARS-CoV-2 public health emergency.  Safety protocols were in place, including screening questions prior to the visit, additional usage of staff PPE, and extensive cleaning of exam room while observing appropriate contact time as indicated for disinfecting solutions.

## 2020-05-06 ENCOUNTER — Encounter: Payer: Self-pay | Admitting: Family Medicine

## 2020-05-06 LAB — HEMOGLOBIN A1C
Hgb A1c MFr Bld: 6 % of total Hgb — ABNORMAL HIGH (ref <5.7)
Mean Plasma Glucose: 126 mg/dL
eAG (mmol/L): 7 mmol/L

## 2020-05-06 LAB — BASIC METABOLIC PANEL
BUN: 20 mg/dL (ref 7–25)
CO2: 28 mmol/L (ref 20–32)
Calcium: 9.2 mg/dL (ref 8.6–10.4)
Chloride: 106 mmol/L (ref 98–110)
Creat: 0.76 mg/dL (ref 0.50–0.99)
Glucose, Bld: 139 mg/dL — ABNORMAL HIGH (ref 65–99)
Potassium: 4.6 mmol/L (ref 3.5–5.3)
Sodium: 141 mmol/L (ref 135–146)

## 2020-05-08 ENCOUNTER — Ambulatory Visit: Payer: 59 | Admitting: Family Medicine

## 2020-05-09 ENCOUNTER — Encounter: Payer: Self-pay | Admitting: Family Medicine

## 2020-05-11 ENCOUNTER — Other Ambulatory Visit: Payer: Self-pay

## 2020-05-11 ENCOUNTER — Emergency Department (INDEPENDENT_AMBULATORY_CARE_PROVIDER_SITE_OTHER)
Admission: EM | Admit: 2020-05-11 | Discharge: 2020-05-11 | Disposition: A | Discharge status: Home / Self Care | Payer: 59 | Source: Home / Self Care | Attending: Family Medicine | Admitting: Family Medicine

## 2020-05-11 DIAGNOSIS — J029 Acute pharyngitis, unspecified: Secondary | ICD-10-CM

## 2020-05-11 LAB — POCT RAPID STREP A (OFFICE): Rapid Strep A Screen: NEGATIVE

## 2020-05-11 NOTE — Discharge Instructions (Addendum)
You may use over the counter ibuprofen or acetaminophen as needed.  For a sore throat, over the counter products such as [employer] Peroxyl Mouth Sore Rinse or Chloraseptic Sore Throat Spray may provide some temporary relief. Your rapid strep test was negative today. We have sent your throat swab for culture and will let you know of any positive results. 

## 2020-05-11 NOTE — ED Triage Notes (Signed)
Pt presents to Urgent Care with c/o sore throat since 4:00 am today. Pt has been vaccinated against COVID and flu.

## 2020-05-12 LAB — STREP A DNA PROBE: Group A Strep Probe: NOT DETECTED

## 2020-05-16 NOTE — ED Provider Notes (Signed)
[facility]   [id number] 05/11/20 Arrival Time: 4287  ASSESSMENT & PLAN:  1. Sore throat     Rapid strep negative. Culture sent. COVID-19 testing. OTC symptom care as needed.    Follow-up Information    Leamon Arnt, MD.   Specialty: Family Medicine Why: As needed. Contact information: [place] [address] [phone]               Reviewed expectations re: course of current medical issues. Questions answered. Outlined signs and symptoms indicating need for more acute intervention. Understanding verbalized. After Visit Summary given.   SUBJECTIVE: History from: patient. Alexandra Walker is a 64 y.o. female who presents with worries over ST. Abrupt onset early am today. Tolerating PO intake without n/v. No fever reported.    OBJECTIVE:  Vitals:   05/11/20 1756 05/11/20 1806  BP:  (!) 143/81  Pulse:  66  Resp:  18  Temp:  98.5 F (36.9 C)  TempSrc:  Oral  SpO2:  97%  Weight: 108.9 kg   Height: '5\' 6"'  (1.676 m)     General appearance: alert; no distress Eyes: PERRLA; EOMI; conjunctiva normal HENT: [place]; AT; without nasal congestion; throat with mild erythema and cobblestoning Neck: supple  Lungs: speaks full sentences without difficulty; unlabored Extremities: no edema Skin: warm and dry Neurologic: normal gait Psychological: alert and cooperative; normal mood and affect  Labs: Results for orders placed or performed during the hospital encounter of 05/11/20  Strep A DNA probe   Specimen: Throat  Result Value Ref Range   Group A Strep Probe NOT DETECTED NOT DETECT  POCT rapid strep A  Result Value Ref Range   Rapid Strep A Screen Negative Negative   Labs Reviewed  STREP A DNA PROBE  POCT RAPID STREP A (OFFICE)     Allergies  Allergen Reactions  . Ace Inhibitors Other (See Comments)    coughing  . Oxycodone-Acetaminophen Nausea And Vomiting  . Vicodin [Hydrocodone-Acetaminophen] Nausea And Vomiting   . Victoza [Liraglutide] Other (See Comments)    pancreatitis  . Adhesive [Tape] Rash    Not on stomach    Past Medical History:  Diagnosis Date  . ADD (attention deficit disorder)   . Allergy   . 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 Chronic depressive disorder   . Circadian rhythm sleep disorder, shift work   . 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 Diabetes ([place])   . Diverticulosis    Minimal  . GERD (gastroesophageal reflux disease)   . Hepatic steatosis   . History of hiatal hernia 2017   Small  . HLA B27 (HLA B27 positive)   . Hx of pancreatitis   . Hyperlipidemia   . Hypertension   . IBS (irritable bowel syndrome)   . Lumbar scoliosis 2010   Mild  . Migraine headache without aura 05/10/2015  . Nephrolithiasis 2014   Left, minimal  . Obesity due to excess calories with serious comorbidity 05/24/2019  . Right bundle branch block   . Sepsis ([place]) 01/2016  . Vitamin D deficiency    Social History   Socioeconomic History  . Marital status: Divorced    Spouse name: Not on file  . Number of children: 1  . Years of education: Not on file  . Highest education level: Not on file  Occupational History  . 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 Smoking status: Never Smoker  .  Smokeless tobacco: Never Used  Vaping Use  . Vaping Use: Never used  Substance and Sexual Activity  . Alcohol use: Yes    Alcohol/week: 0.0 standard drinks    Comment: social  . Drug use: No  . Sexual activity: Yes    Birth control/protection: Surgical  Other Topics Concern  . 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   Social Determinants of Health   Financial Resource Strain: Not on file  Food Insecurity: Not on file  Transportation Needs: Not on file  Physical Activity: Not on file  Stress: Not on file  Social Connections: Not on file  Intimate Partner Violence: Not on file    Family History  Problem Relation Age of Onset  . Heart attack Father   . Heart disease Father   . Coronary artery disease Mother   . Hypertension Mother   . Heart disease Mother   . Heart attack Mother   . Diabetes Mother   . Hyperlipidemia Mother   . Kidney disease Mother   . Depression Mother   . Sleep apnea Mother   . Obesity Mother   . Hypertension Sister   . Hypertension Brother   . Colon cancer Neg Hx    Past Surgical History:  Procedure Laterality Date  . BLADDER SURGERY    . BREAST BIOPSY Left   . CERVICAL CONE BIOPSY    . CHOLECYSTECTOMY N/A 10/26/2014   Procedure: LAPAROSCOPIC CHOLECYSTECTOMY;  Surgeon: Coralie Keens, MD;  Location: [facility];  Service: General;  Laterality: N/A;  . COLONOSCOPY W/ BIOPSIES  multiple  . CYSTOSCOPY WITH RETROGRADE PYELOGRAM, URETEROSCOPY AND STENT PLACEMENT Left 06/26/2018   Procedure: CYSTOSCOPY WITH RETROGRADE PYELOGRAM, URETEROSCOPY AND STENT PLACEMENT;  Surgeon: Alexis Frock, MD;  Location: [facility];  Service: Urology;  Laterality: Left;  . DIAGNOSTIC LAPAROSCOPY    . DILATION AND CURETTAGE OF UTERUS  07,11  . DUPUYTREN CONTRACTURE RELEASE    . ESOPHAGOGASTRODUODENOSCOPY  multiple  . EYE SURGERY    . HOLMIUM LASER APPLICATION Left 07/24/675   Procedure: HOLMIUM LASER APPLICATION;  Surgeon: Alexis Frock, MD;  Location: [facility];  Service: Urology;  Laterality: Left;  . KNEE ARTHROSCOPY Left 11/2008  . LAPAROSCOPIC VAGINAL HYSTERECTOMY WITH SALPINGO OOPHORECTOMY Bilateral 10/03/2015   Procedure: LAPAROSCOPIC ASSISTED VAGINAL HYSTERECTOMY WITH SALPINGECTOMY;  Surgeon: Eldred Manges, MD;  Location: [facility];  Service: Gynecology;  Laterality: Bilateral;  . LASIK  1990   RK  . LEFT FINGER SURGERY    . LIVER BIOPSY    . MASS EXCISION Left 12/24/2012   Procedure: LEFT EXCISION OF PALMAR MASS X TWO;  Surgeon: Roseanne Kaufman, MD;  Location: [facility];  Service:  Orthopedics;  Laterality: Left;  . [place]     Vanessa Kick, MD 05/16/20 [phone]

## 2020-05-17 ENCOUNTER — Other Ambulatory Visit: Payer: Self-pay

## 2020-05-17 ENCOUNTER — Emergency Department (INDEPENDENT_AMBULATORY_CARE_PROVIDER_SITE_OTHER)
Admission: EM | Admit: 2020-05-17 | Discharge: 2020-05-17 | Disposition: A | Discharge status: Home / Self Care | Payer: 59 | Source: Home / Self Care | Attending: Family Medicine | Admitting: Family Medicine

## 2020-05-17 ENCOUNTER — Emergency Department: Admit: 2020-05-17 | Discharge status: Absent without leave | Payer: Self-pay

## 2020-05-17 DIAGNOSIS — J069 Acute upper respiratory infection, unspecified: Secondary | ICD-10-CM

## 2020-05-17 DIAGNOSIS — Z20822 Contact with and (suspected) exposure to covid-19: Secondary | ICD-10-CM

## 2020-05-17 DIAGNOSIS — R7301 Impaired fasting glucose: Secondary | ICD-10-CM

## 2020-05-17 NOTE — ED Triage Notes (Signed)
Pt c/o cough, nasal congestion, intermittent headache x 3 days. Was seen here in UC on 12/23 for sore throat, strep neg. Mucinex, ibuprofen prn. Vaccinated against flu and covid. Found out coworker was covid pos on Christmas.  Pt is hypertensive today, took meds late today.

## 2020-05-17 NOTE — ED Provider Notes (Signed)
[name]    CSN: [id number] Arrival date & time: 05/17/20  1415      History   Chief Complaint Chief Complaint  Patient presents with  . Cough  . Nasal Congestion    HPI Alexandra Walker is a 64 y.o. female.   Patient complains of nasal congestion cough and headache.  Headache seems to be run over her sinuses.  There is some drainage that is light-colored.  Was tested for strep on 1123.  She has been vaccinated against flu and Covid.  Coworker was found to have Covid on Christmas Day and this patient is concerned about that she might also have it  HPI  Past Medical History:  Diagnosis Date  . ADD (attention deficit disorder)   . Allergy   . 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 Chronic depressive disorder   . Circadian rhythm sleep disorder, shift work   . 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 Diabetes ([place])   . Diverticulosis    Minimal  . GERD (gastroesophageal reflux disease)   . Hepatic steatosis   . History of hiatal hernia 2017   Small  . HLA B27 (HLA B27 positive)   . Hx of pancreatitis   . Hyperlipidemia   . Hypertension   . IBS (irritable bowel syndrome)   . Lumbar scoliosis 2010   Mild  . Migraine headache without aura 05/10/2015  . Nephrolithiasis 2014   Left, minimal  . Obesity due to excess calories with serious comorbidity 05/24/2019  . Right bundle branch block   . Sepsis ([place]) 01/2016  . Vitamin D deficiency     Patient Active Problem List   Diagnosis Date Noted  . Shifting sleep-work schedule, affecting sleep 08/09/2019  . Obesity due to excess calories with serious comorbidity 05/24/2019  . Bilateral hip bursitis 05/06/2019  . Diverticulosis of colon 05/16/2018  . Drug-induced pancreatitis, 2015, thought to be due to [facility] 03/29/2018  . Nephrolithiasis 03/29/2018  . History of vitreous detachment OU, HTN retinopathy OU, High myopia OU, Cataracts OU, Vitreous floaters OU, Keratoconjunctivitis sicca OU not  specified as Sjogren's 03/29/2018  . 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 Attention deficit hyperactivity disorder (ADHD), combined type 10/19/2017  . Recurrent circadian rhythm sleep disorder, shift work type 03/31/2017  . IBS (irritable bowel syndrome), diarrhea predominant 03/17/2017  . Hepatic steatosis 09/26/2016  . OSA on CPAP, followed by Neurology/Sleep 09/26/2016  . History of sepsis 02/01/2016  . Essential hypertension 01/31/2016  . IFG (impaired fasting glucose) 01/31/2016  . Mixed hyperlipidemia 05/10/2015  . Vitamin D deficiency, on Vit D 2000 IU daily 05/10/2015  . Palpitations 09/19/2011    Past Surgical History:  Procedure Laterality Date  . BLADDER SURGERY    . BREAST BIOPSY Left   . CERVICAL CONE BIOPSY    . CHOLECYSTECTOMY N/A 10/26/2014   Procedure: LAPAROSCOPIC CHOLECYSTECTOMY;  Surgeon: Coralie Keens, MD;  Location: [facility];  Service: General;  Laterality: N/A;  . COLONOSCOPY W/ BIOPSIES  multiple  . CYSTOSCOPY WITH RETROGRADE PYELOGRAM, URETEROSCOPY AND STENT PLACEMENT Left 06/26/2018   Procedure: CYSTOSCOPY WITH RETROGRADE PYELOGRAM, URETEROSCOPY AND STENT PLACEMENT;  Surgeon: Alexis Frock, MD;  Location: [facility];  Service: Urology;  Laterality: Left;  . DIAGNOSTIC LAPAROSCOPY    . DILATION AND CURETTAGE OF UTERUS  07,11  . DUPUYTREN CONTRACTURE RELEASE    . ESOPHAGOGASTRODUODENOSCOPY  multiple  . EYE SURGERY    . HOLMIUM LASER APPLICATION Left 06/26/348   Procedure:  HOLMIUM LASER APPLICATION;  Surgeon: Alexis Frock, MD;  Location: [facility];  Service: Urology;  Laterality: Left;  . KNEE ARTHROSCOPY Left 11/2008  . LAPAROSCOPIC VAGINAL HYSTERECTOMY WITH SALPINGO OOPHORECTOMY Bilateral 10/03/2015   Procedure: LAPAROSCOPIC ASSISTED VAGINAL HYSTERECTOMY WITH SALPINGECTOMY;  Surgeon: Eldred Manges, MD;  Location: [facility];  Service: Gynecology;  Laterality: Bilateral;  . LASIK  1990   RK  . LEFT FINGER  SURGERY    . LIVER BIOPSY    . MASS EXCISION Left 12/24/2012   Procedure: LEFT EXCISION OF PALMAR MASS X TWO;  Surgeon: Roseanne Kaufman, MD;  Location: [facility];  Service: Orthopedics;  Laterality: Left;  . PHOTOREFRACTIVE KERATOTOMY  1994    OB History    Gravida  1   Para      Term      Preterm      AB  1   Living        SAB  1   IAB      Ectopic      Multiple      Live Births               Home Medications    Prior to Admission medications   Medication Sig Start Date End Date Taking? Authorizing Provider  amphetamine-dextroamphetamine (ADDERALL) 10 MG tablet Take 1 tablet (10 mg total) by mouth daily. Patient taking differently: Take 10 mg by mouth daily as needed. 08/30/19   Leamon Arnt, MD  aspirin 325 MG tablet Take 325 mg by mouth daily.    [provider]  Blood Glucose Monitoring Suppl (FREESTYLE LITE) DEVI Check blood sugar 2-3 times per day.  Dx:E11.21 10/21/16   Shelda Pal, DO  cholecalciferol (VITAMIN D) 1000 UNITS tablet Take 2,000 Units by mouth daily.     [provider]  cyclobenzaprine (FLEXERIL) 10 MG tablet Take 1 tablet (10 mg total) by mouth 3 (three) times daily as needed for muscle spasms. 07/05/19   Leamon Arnt, MD  diclofenac sodium (VOLTAREN) 1 % GEL APPLY 2 GRAMS TOPICALLY 2 TIMES DAILY AS NEEDED. NOTE: 2 GRAMS IS EQUIVALENT TO A NICKEL SIZED AMOUNT. 05/30/16   [provider]  losartan (COZAAR) 100 MG tablet Take 1 tablet (100 mg total) by mouth daily. 08/30/19   Leamon Arnt, MD  metoprolol succinate (TOPROL-XL) 50 MG 24 hr tablet TAKE 1 TABLET BY MOUTH DAILY WITH OR IMMEDIATELY FOLLOWING A MEAL 02/28/20   Leamon Arnt, MD  Omega-3 1000 MG CAPS Take 1 capsule by mouth daily.     [provider]  rosuvastatin (CRESTOR) 10 MG tablet Take 1 tablet (10 mg total) by mouth daily. 05/25/19   Leamon Arnt, MD  Turmeric 450 MG CAPS Take 450 mg by mouth daily.     [provider]    Family History Family History  Problem Relation Age of Onset  . Heart attack Father   . Heart disease Father   . Coronary artery disease Mother   . Hypertension Mother   . Heart disease Mother   . Heart attack Mother   . Diabetes Mother   . Hyperlipidemia Mother   . Kidney disease Mother   . Depression Mother   . Sleep apnea Mother   . Obesity Mother   . Hypertension Sister   . Hypertension Brother   . Colon cancer Neg Hx     Social History Social History   Tobacco Use  .  Smoking status: Never Smoker  . Smokeless tobacco: Never Used  Vaping Use  . Vaping Use: Never used  Substance Use Topics  . Alcohol use: Yes    Alcohol/week: 0.0 standard drinks    Comment: social  . Drug use: No     Allergies   Ace inhibitors, Oxycodone-acetaminophen, Vicodin [hydrocodone-acetaminophen], Victoza [liraglutide], and Adhesive [tape]   Review of Systems Review of Systems  HENT: Positive for congestion, postnasal drip and rhinorrhea.   Respiratory: Positive for cough.   All other systems reviewed and are negative.    Physical Exam Triage Vital Signs ED Triage Vitals  Enc Vitals Group     BP 05/17/20 1440 (!) 161/107     Pulse Rate 05/17/20 1440 77     Resp 05/17/20 1440 18     Temp 05/17/20 1440 98.8 F (37.1 C)     Temp Source 05/17/20 1440 Oral     SpO2 05/17/20 1440 97 %     Weight --      Height --      Head Circumference --      Peak Flow --      Pain Score 05/17/20 1441 0     Pain Loc --      Pain Edu? --      Excl. in [place]? --    No data found.  Updated Vital Signs BP (!) 161/107 (BP Location: Right Arm)   Pulse 77   Temp 98.8 F (37.1 C) (Oral)   Resp 18   LMP 05/20/2008   SpO2 97%   Visual Acuity Right Eye Distance:   Left Eye Distance:   Bilateral Distance:    Right Eye Near:   Left Eye Near:    Bilateral Near:     Physical Exam Vitals and nursing note reviewed.  Constitutional:      Appearance: Normal appearance.   HENT:     Head: Normocephalic.     Nose: Nose normal.     Mouth/Throat:     Comments: Pus on right tonsil Cardiovascular:     Rate and Rhythm: Normal rate and regular rhythm.  Pulmonary:     Effort: Pulmonary effort is normal.     Breath sounds: Normal breath sounds.  Neurological:     General: No focal deficit present.     Mental Status: She is alert and oriented to person, place, and time.      UC Treatments / Results  Labs (all labs ordered are listed, but only abnormal results are displayed) Labs Reviewed - No data to display  EKG   Radiology No results found.  Procedures Procedures (including critical care time)  Medications Ordered in UC Medications - No data to display  Initial Impression / Assessment and Plan / UC Course  I have reviewed the triage vital signs and the nursing notes.  Pertinent labs & imaging results that were available during my care of the patient were reviewed by me and considered in my medical decision making (see chart for details).     Upper respiratory infection.  Possible sinusitis.  Concerned about Covid with exposure. Final Clinical Impressions(s) / UC Diagnoses   Final diagnoses:  None   Discharge Instructions   None    ED Prescriptions    None     PDMP not reviewed this encounter.   Wardell Honour, MD 05/17/20 [phone]

## 2020-05-17 NOTE — Discharge Instructions (Addendum)
Continue use of Mucinex and Sudafed

## 2020-05-19 ENCOUNTER — Encounter: Payer: Self-pay | Admitting: Family Medicine

## 2020-05-19 ENCOUNTER — Telehealth: Payer: 59 | Admitting: Family

## 2020-05-19 ENCOUNTER — Other Ambulatory Visit: Payer: Self-pay | Admitting: Family

## 2020-05-19 DIAGNOSIS — R059 Cough, unspecified: Secondary | ICD-10-CM

## 2020-05-19 DIAGNOSIS — J208 Acute bronchitis due to other specified organisms: Secondary | ICD-10-CM

## 2020-05-19 LAB — COVID-19, FLU A+B AND RSV
Influenza A, NAA: NOT DETECTED
Influenza B, NAA: NOT DETECTED
RSV, NAA: NOT DETECTED
SARS-CoV-2, NAA: NOT DETECTED

## 2020-05-19 MED ORDER — PREDNISONE 10 MG (21) PO TBPK: ORAL_TABLET | ORAL | 0 refills | Status: DC

## 2020-05-19 MED ORDER — BENZONATATE 200 MG PO CAPS: 200.0000 mg | ORAL_CAPSULE | Freq: Three times a day (TID) | ORAL | 0 refills | Status: DC | PRN

## 2020-05-19 MED FILL — predniSONE 10 MG TABS: 10 | 6 days supply | Qty: 21 | Fill #0

## 2020-05-19 MED FILL — BENZONATATE 200 MG CAPS: 200 | 5 days supply | Qty: 15 | Fill #0

## 2020-05-19 NOTE — Addendum Note (Signed)
Addended by: Worthy Rancher B on: 05/19/2020 01:29 PM   Modules accepted: Orders

## 2020-05-19 NOTE — Progress Notes (Signed)
We are sorry that you are not feeling well.  Here is how we plan to help!  Based on your presentation I believe you most likely have A cough due to a virus.  This is called viral bronchitis and is best treated by rest, plenty of fluids and control of the cough.  You may use Ibuprofen or Tylenol as directed to help your symptoms.     In addition you may use A non-prescription cough medication called Robitussin DAC. Take 2 teaspoons every 8 hours or Delsym: take 2 teaspoons every 12 hours. and A prescription cough medication called Tessalon Perles 100mg. You may take 1-2 capsules every 8 hours as needed for your cough.  Prednisone 10 mg daily for 6 days (see taper instructions below)  Directions for 6 day taper: Day 1: 2 tablets before breakfast, 1 after both lunch & dinner and 2 at bedtime Day 2: 1 tab before breakfast, 1 after both lunch & dinner and 2 at bedtime Day 3: 1 tab at each meal & 1 at bedtime Day 4: 1 tab at breakfast, 1 at lunch, 1 at bedtime Day 5: 1 tab at breakfast & 1 tab at bedtime Day 6: 1 tab at breakfast   From your responses in the eVisit questionnaire you describe inflammation in the upper respiratory tract which is causing a significant cough.  This is commonly called Bronchitis and has four common causes:    Allergies  Viral Infections  Acid Reflux  Bacterial Infection Allergies, viruses and acid reflux are treated by controlling symptoms or eliminating the cause. An example might be a cough caused by taking certain blood pressure medications. You stop the cough by changing the medication. Another example might be a cough caused by acid reflux. Controlling the reflux helps control the cough.  USE OF BRONCHODILATOR ("RESCUE") INHALERS: There is a risk from using your bronchodilator too frequently.  The risk is that over-reliance on a medication which only relaxes the muscles surrounding the breathing tubes can reduce the effectiveness of medications prescribed to  reduce swelling and congestion of the tubes themselves.  Although you feel brief relief from the bronchodilator inhaler, your asthma may actually be worsening with the tubes becoming more swollen and filled with mucus.  This can delay other crucial treatments, such as oral steroid medications. If you need to use a bronchodilator inhaler daily, several times per day, you should discuss this with your provider.  There are probably better treatments that could be used to keep your asthma under control.     HOME CARE . Only take medications as instructed by your medical team. . Complete the entire course of an antibiotic. . Drink plenty of fluids and get plenty of rest. . Avoid close contacts especially the very young and the elderly . Cover your mouth if you cough or cough into your sleeve. . Always remember to wash your hands . A steam or ultrasonic humidifier can help congestion.   GET HELP RIGHT AWAY IF: . You develop worsening fever. . You become short of breath . You cough up blood. . Your symptoms persist after you have completed your treatment plan MAKE SURE YOU   Understand these instructions.  Will watch your condition.  Will get help right away if you are not doing well or get worse.  Your e-visit answers were reviewed by a board certified advanced clinical practitioner to complete your personal care plan.  Depending on the condition, your plan could have included both over the   counter or prescription medications. If there is a problem please reply  once you have received a response from your provider. Your safety is important to [name].  If you have drug allergies check your prescription carefully.    You can use MyChart to ask questions about today's visit, request a non-urgent call back, or ask for a work or school excuse for 24 hours related to this e-Visit. If it has been greater than 24 hours you will need to follow up with your provider, or enter a new e-Visit to address those  concerns. You will get an e-mail in the next two days asking about your experience.  I hope that your e-visit has been valuable and will speed your recovery. Thank you for using e-visits.  Greater than 5 minutes, yet less than 10 minutes of time have been spent researching, coordinating, and implementing care for this patient today.  Thank you for the details you included in the comment boxes. Those details are very helpful in determining the best course of treatment for you and help [name] to provide the best care.

## 2020-05-23 MED FILL — METOPROLOL SUCCINATE ER 50: 50 | 90 days supply | Qty: 90 | Fill #1

## 2020-06-13 ENCOUNTER — Other Ambulatory Visit: Payer: Self-pay | Admitting: Family Medicine

## 2020-06-13 MED FILL — ROSUVASTATIN CALCIUM 10 MG: 10 | 90 days supply | Qty: 90 | Fill #0

## 2020-06-30 DIAGNOSIS — M722 Plantar fascial fibromatosis: Secondary | ICD-10-CM | POA: Insufficient documentation

## 2020-07-10 MED FILL — LOSARTAN POTASSIUM 100 MG T: 100 | 90 days supply | Qty: 90 | Fill #3

## 2020-07-19 DIAGNOSIS — G4733 Obstructive sleep apnea (adult) (pediatric): Secondary | ICD-10-CM | POA: Diagnosis not present

## 2020-08-04 ENCOUNTER — Other Ambulatory Visit: Payer: Self-pay | Admitting: Sports Medicine

## 2020-08-04 ENCOUNTER — Other Ambulatory Visit (HOSPITAL_COMMUNITY): Payer: Self-pay | Admitting: Sports Medicine

## 2020-08-04 DIAGNOSIS — M1712 Unilateral primary osteoarthritis, left knee: Secondary | ICD-10-CM | POA: Diagnosis not present

## 2020-08-04 DIAGNOSIS — M79605 Pain in left leg: Secondary | ICD-10-CM | POA: Diagnosis not present

## 2020-08-07 ENCOUNTER — Encounter: Payer: Self-pay | Admitting: Neurology

## 2020-08-07 ENCOUNTER — Ambulatory Visit (INDEPENDENT_AMBULATORY_CARE_PROVIDER_SITE_OTHER): Payer: 59 | Admitting: Neurology

## 2020-08-07 VITALS — BP 148/86 | HR 63 | Ht 66.0 in | Wt 258.0 lb

## 2020-08-07 DIAGNOSIS — G4733 Obstructive sleep apnea (adult) (pediatric): Secondary | ICD-10-CM | POA: Diagnosis not present

## 2020-08-07 DIAGNOSIS — G4726 Circadian rhythm sleep disorder, shift work type: Secondary | ICD-10-CM

## 2020-08-07 DIAGNOSIS — Z9989 Dependence on other enabling machines and devices: Secondary | ICD-10-CM | POA: Diagnosis not present

## 2020-08-07 NOTE — Progress Notes (Signed)
SLEEP MEDICINE CLINIC   Provider:  Larey Seat, MD   Referring Provider: Leamon Arnt, MD Primary Care Physician:  Leamon Arnt, MD  Chief Complaint  Patient presents with  .     Pt alone, rm 10. Presents today for yearly check up. Overall doing well. No issues or concerns. DME [facility] ([facility])    08-07-2020, Interval history for [name], Nurse Midwife, She has the usual lower compliance based on her on-call history-  She is not sleepy while working nights.  Has a cumulative sleep deficit at the week end , making up by sleeping `12 hours off call- live style is a 4 day weekend.  I have the pleasure of also see that her CPAP which is still set at 8 cmH2O with 3 cm EPR has completely resolved her apnea her residual AHI is again excellent at 0.2/h.  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   So I do not need to make any adjustments her air sense AutoSet machine however will be soon 65 years old I usually check on a home sleep test just before I order a new machine and that will be next year.   She has endorsed the fatigue severity score at 31 points and the Epworth sleepiness score at 4 points, she endorsed 5 out of 15 points on a depression score.  New machine due in 2023.        08-09-2019: Wilkie Aye. 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 She has been struggling with obesity ( BMI is now 38.7). Mrs. Isaacson is a compliant CPAP user, she is using an air sense 10 machine with a serial #[id number]. 100% Percent compliance by the number of days used- but since she is a night shift worker there are only 23 days where she could sleep through with her CPAP in use-77%.  This is an expected ratio.   Her average usage on days used is 5 hours 9 minutes, the CPAP is set to 8 cm pressure  with 3 cm EPR and her AHI is only 0.2/h which speaks for an excellent resolution. She has no central apneas emerging the 95th percentile pressure cannot be determined since the machine is not used as an auto titrate her.  She has minimal air leakage which speaks for very good mask fit. She is not daytime sleepy, feels refreshed and alert.  Her job has not been more stressful, she sees 3-5 Covid positive - pregnancies pr week, usually learning the COVID status just after the admission-  . She is vaccinated and was boosted.  Her daughter had Covid in February, last month.      08-05-2018. 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  It was a busy night again she reports.   Patient with anticardiolipid ab syndrome, atrial fib, DM 2,   PEITYN PAYTON is a 65 y.o. female [employer] employee, she just returned form a conference on [place]. 100 people. She came home on Sunday the 15th.  I have the Wi-Fi download for Mrs. Jambor CPAP compliance and since she is a night shift worker 12-hour shifts on Tuesday, Wednesday , Thursday- her sleep is much better with  these nights on bloc.  The patient has an overall compliance of 27 out of 30 days, she used the machine over 4 hours on 63% of these days.  This is also due to her restricted daytime ability to sleep.  For a night shift worker this is a good compliance.  The machine has a set pressure of 8 cmH2O with 3 cm EPR and her residual AHI is 0.2 excellent resolution no air leaks, I am very happy with his compliance.  Her depression score was endorsed at 3 out of 15 points, the Epworth sleepiness score was endorsed at 5 points fatigue severity score at 34 points. I did renew Mrs. Cuellar CPAP supply prescription for the coming year.    Interval history from 09/26/2016. Mrs. Och returned after I had  ordered a CPAP titration for her based on a recent referral by Shelda Pal, D.O.  I had  seen the patient already in 2007- but at the time she could not tolerate CPAP in a SPLIT study.   The patient was in  9/ 2017 admitted with a urosepsis, due to kidney stone infection- pyelonephritis, with Klebsiella-  The sleep study was ordered based on a baseline polysomnography performed at the [facility] in December 2017, which had diagnosed the patient with sleep apnea, hypopnea at an AHI of 21 per hour, moderate hypoxemia of sleep, mean saturation was only 87% SPO2 raising the question of an underlying cardiopulmonary disease but probably hypoventilation due to obesity. The patient was titrated to 8 cm water pressure the AHI was 0.9 per hour, she did not produce any periodic limb movements oxygen nadir rose to 85% of the time spent at 88% saturation or below equal at only 25 minutes. She was fitted with a nasal pillow while using a mouthguard.  As the pleasure to look at the patient's compliance record today, which is excellent she has used to machine 28 out of 30 days with a 77% compliance but hours. 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 Average user time is 5 hours 21 minutes, residual apnea index is 0.3 the 95th percentile pressure has been met, she does not have air leaks. I would like for her to continue with the current machine and settings as well as the interface. The patient also reported that her hepatologist at [facility] voiced approval of CPAP therapy for all patients with fatty liver disease. I hope that the CPAP will help her to reduce body weight, promote sleep before midnight allowing her to have more balanced insulin household, and continues to allow her be less daytime sleepy. She endorsed today the Epworth sleepiness score at only 5 points and fatigue severity score was 36 points. She has visibly lost weight, her face and neck are smaller and I adjusted the measurements in my physical exam note. I will order a ONO for her   After she mentioned that her dentist felt continuous clenching represents hypoxemia- or a reaction to low oxygen.   History-  I used to see Mrs. 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 She was exhausted. In the meantime she has made attempts to lose some weight, she joined tae kwon do classes, and has lost another 14 pounds since joining the gym in October. She is also with a new primary care physician  [name]. Until recently she was followed by [name], whose office is now affiliated with the [place] [facility] system and out of network. She is taking metformin 1000 g twice a day right now, still considered prediabetic ( ?).  Her after meal the glucometer readings have been in normal range but her fasting morning glucose is elevated constantly. She reports measuring 130s 140s.  She was also diagnosed with a fatty liver, non-hepatitic. She has joined [facility], a biopsy was recently scheduled. Negative fibrosis scan.  She also has chronic lower extremity edema, monopolar depressive disorder, circadian rhythm sleep disorder, diabetes, Baker's cyst and ganglion of the left knee, GERD, anticardiolipin syndrome, hepatic steatosis, hyperlipidemia, hypertension, hypokalemia, hypomagnesemia, sacroiliitis. She was admitted with sepsis in September 2017, was very sick and developed supraventricular tachycardia in the setting as well as a brief run of atrial fibrillation. Her follow-up cardiac studies returned normal. There is a concern that the patient with his morning glucose levels lacks slow wave sleep or delta sleep, thereby is not generating enough insulin or human growth hormone.  [name] referred her to [name] for sleep study AHI during REM sleep was 26.1 per hour supine AHI 81.2 per hour REM latency was very long 273 minutes. Sleep  efficiency was 89% oxygen nadir 77% mean oxygen saturation was 87.8. No PLMS noted.  Overall AHI was 21.0 per hour. Given these data ,strongly recommend CPAP titration.  She got no results of this sleep study, and no appointment  Within 3 or 4 weeks after sleep test, she called our office to have an outside sleep study interpreted and explained. Sleep habits are as follows: bedtime is 11 PM , asleep by 11.  30 minutes - sleeping through for 6 hours, and when not working ,may have fragmented , dream rich  sleep until 10 AM. Sleep in a cool, quiet and dark room, alone. One pillow, sleeping on the side. Neck hurts, uses special pillows. Has a stuffy nose, uses afrin. . Social history:   Single, mother on BiPAP-  Never used CPAP. Midwife, losing weight, going to the gym. No tobacco use , no ETOH use. Low carb. Caffeine - 1 cup a day. 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    Review of Systems: Out of a complete 14 system review, the patient complains of only the following symptoms, and all other reviewed systems are negative.  Given that she has a history of rhinitis and stuffy nose I would like for her to have heated humidification and try and nasal mask, nasal pillow and full face mask for comfort. She should feel free to choose her interface. Mallampatti 3, neck circumference 15 ".   How likely are you to doze in the following situations: 0 = not likely, 1 = slight chance, 2 = moderate chance, 3 = high chance  Sitting and Reading? Watching Television? Sitting inactive in a public place (theater or meeting)? Lying down in the afternoon when circumstances permit? Sitting and talking to someone? Sitting quietly after lunch without alcohol? In a car, while stopped for a few minutes in traffic? As a passenger in a car for an hour without a break?  Total = 4/ 24  FSS- 31/ 63 GDS 5/ 15 points.      Social History    Socioeconomic History  . Marital status: Divorced    Spouse name: Not  on file  . Number of children: 1  . Years of education: Not on file  . Highest education level: Not on file  Occupational History  . 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 Smoking status: Never Smoker  . Smokeless tobacco: Never Used  Vaping Use  . Vaping Use: Never used  Substance and Sexual Activity  . Alcohol use: Yes    Alcohol/week: 0.0 standard drinks    Comment: social  . Drug use: No  . Sexual activity: Yes    Birth control/protection: Surgical  Other Topics Concern  . 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   Social Determinants of Health   Financial Resource Strain: Not on file  Food Insecurity: Not on file  Transportation Needs: Not on file  Physical Activity: Not on file  Stress: Not on file  Social Connections: Not on file  Intimate Partner Violence: Not on file    Family History  Problem Relation Age of Onset  . Heart attack Father   . Heart disease Father   . Coronary artery disease Mother   . Hypertension Mother   . Heart disease Mother   . Heart attack Mother   . Diabetes Mother   . Hyperlipidemia Mother   . Kidney disease Mother   . Depression Mother   . Sleep apnea Mother   . Obesity Mother   . Hypertension Sister   . Hypertension Brother   . Colon cancer Neg Hx     Past Medical History:  Diagnosis Date  . ADD (attention deficit disorder)   . Allergy   . 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[place])   . Chronic depressive disorder   . Circadian rhythm sleep disorder, shift work   . 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 Diabetes ([place])   . Diverticulosis    Minimal  . GERD (gastroesophageal reflux disease)   . Hepatic steatosis   . History of hiatal hernia 2017   Small  . HLA B27 (HLA B27 positive)   . Hx of pancreatitis   . Hyperlipidemia   . Hypertension   .  IBS (irritable bowel syndrome)   . Lumbar scoliosis 2010   Mild  . Migraine headache without aura 05/10/2015  . Nephrolithiasis 2014   Left, minimal  . Obesity due to excess calories with serious comorbidity 05/24/2019  . Right bundle branch block   . Sepsis ([place]) 01/2016  . Vitamin D deficiency     Past Surgical History:  Procedure Laterality Date  . BLADDER SURGERY    . BREAST BIOPSY Left   . CERVICAL CONE BIOPSY    . CHOLECYSTECTOMY N/A 10/26/2014   Procedure: LAPAROSCOPIC CHOLECYSTECTOMY;  Surgeon: Coralie Keens, MD;  Location: [facility];  Service: General;  Laterality: N/A;  . COLONOSCOPY W/ BIOPSIES  multiple  . CYSTOSCOPY WITH RETROGRADE PYELOGRAM, URETEROSCOPY AND STENT PLACEMENT Left 06/26/2018   Procedure: CYSTOSCOPY WITH RETROGRADE PYELOGRAM, URETEROSCOPY AND STENT PLACEMENT;  Surgeon: Alexis Frock, MD;  Location: [facility];  Service: Urology;  Laterality: Left;  . DIAGNOSTIC LAPAROSCOPY    . DILATION AND CURETTAGE OF UTERUS  07,11  . DUPUYTREN CONTRACTURE RELEASE    . ESOPHAGOGASTRODUODENOSCOPY  multiple  . EYE SURGERY    . HOLMIUM LASER APPLICATION Left 08/20/8766   Procedure: HOLMIUM LASER APPLICATION;  Surgeon: Alexis Frock, MD;  Location: [facility];  Service: Urology;  Laterality:  Left;  . KNEE ARTHROSCOPY Left 11/2008  . LAPAROSCOPIC VAGINAL HYSTERECTOMY WITH SALPINGO OOPHORECTOMY Bilateral 10/03/2015   Procedure: LAPAROSCOPIC ASSISTED VAGINAL HYSTERECTOMY WITH SALPINGECTOMY;  Surgeon: Eldred Manges, MD;  Location: [name];  Service: Gynecology;  Laterality: Bilateral;  . LASIK  1990   RK  . LEFT FINGER SURGERY    . LIVER BIOPSY    . MASS EXCISION Left 12/24/2012   Procedure: LEFT EXCISION OF PALMAR MASS X TWO;  Surgeon: Roseanne Kaufman, MD;  Location: [facility];  Service: Orthopedics;  Laterality: Left;  . PHOTOREFRACTIVE KERATOTOMY  1994    Current Outpatient Medications  Medication Sig  Dispense Refill  . amphetamine-dextroamphetamine (ADDERALL) 10 MG tablet Take 1 tablet (10 mg total) by mouth daily. (Patient taking differently: Take 10 mg by mouth daily as needed.) 30 tablet 0  . aspirin 325 MG tablet Take 325 mg by mouth daily.    . benzonatate (TESSALON) 200 MG capsule Take 1 capsule (200 mg total) by mouth 3 (three) times daily as needed for cough. 15 capsule 0  . Blood Glucose Monitoring Suppl (FREESTYLE LITE) DEVI Check blood sugar 2-3 times per day.  Dx:E11.21 1 each 0  . cholecalciferol (VITAMIN D) 1000 UNITS tablet Take 2,000 Units by mouth daily.     . cyclobenzaprine (FLEXERIL) 10 MG tablet Take 1 tablet (10 mg total) by mouth 3 (three) times daily as needed for muscle spasms. 30 tablet 1  . diclofenac sodium (VOLTAREN) 1 % GEL APPLY 2 GRAMS TOPICALLY 2 TIMES DAILY AS NEEDED. NOTE: 2 GRAMS IS EQUIVALENT TO A NICKEL SIZED AMOUNT.    Marland Kitchen losartan (COZAAR) 100 MG tablet Take 1 tablet (100 mg total) by mouth daily. 90 tablet 3  . metoprolol succinate (TOPROL-XL) 50 MG 24 hr tablet TAKE 1 TABLET BY MOUTH DAILY WITH OR IMMEDIATELY FOLLOWING A MEAL 90 tablet 3  . Omega-3 1000 MG CAPS Take 1 capsule by mouth daily.     . rosuvastatin (CRESTOR) 10 MG tablet TAKE 1 TABLET (10 MG TOTAL) BY MOUTH DAILY. 90 tablet 3  . Turmeric 450 MG CAPS Take 450 mg by mouth daily.      No current facility-administered medications for this visit.    Allergies as of 08/07/2020 - Review Complete 08/07/2020  Allergen Reaction Noted  . Ace inhibitors Other (See Comments) 09/05/2015  . Oxycodone-acetaminophen Nausea And Vomiting 09/19/2011  . Vicodin [hydrocodone-acetaminophen] Nausea And Vomiting 09/19/2011  . Victoza [liraglutide] Other (See Comments) 09/01/2014  . Adhesive [tape] Rash 06/18/2018    Vitals: BP (!) 148/86   Pulse 63   Ht '5\' 6"'  (1.676 m)   Wt 258 lb (117 kg)   LMP 05/20/2008   BMI 41.64 kg/m  Last Weight:  Wt Readings from Last 1 Encounters:  08/07/20 258 lb (117 kg)    WGN:FAOZ mass index is 41.64 kg/m.     Last Height:   Ht Readings from Last 1 Encounters:  08/07/20 '5\' 6"'  (1.676 m)    Physical exam:  General: The patient is awake, alert and appears not in acute distress. The patient is well groomed. Head: Normocephalic, atraumatic. Neck is supple. Mallampati 4  neck circumference: 15.25 . Nasal airflow restricted, TMJ is evident. Retrognathia is not seen.  Bruxism marks ! . Still wears mouth guard, which fits unde nasal pillows/ nasal  ask.  Cardiovascular:  Regular rate and rhythm, 53 bpm.  Respiratory: Lungs are clear to auscultation. Skin:  Ankle edema, moderate.Trunk: BMI is 38.9 kg/m2.  The patient's posture is erect-   Neurologic exam :The patient is awake and alert, oriented to place and time.  Mood and affect are appropriate, she is happy. Cranial nerves:Pupils are equal and briskly reactive to light and accomodation.   Extraocular movements  in vertical and horizontal planes intact and without nystagmus. Visual fields by finger perimetry are intact.Hearing to finger rub intact. Facial sensation intact to fine touch. Facial motor strength is symmetric and tongue and uvula move midline. Tongue protrusion into cheeks is strong.  Shoulder shrug was symmetrical.   The patient was advised of the nature of the diagnosed sleep disorder, the treatment options and risks for general a health and wellness arising from not treating the condition.  I spent more than 25  minutes of face to face time with the patient.  Greater than 50% of time was spent in counseling and coordination of care. We have discussed the diagnosis and differential and I answered the patient's questions.     Assessment:  After physical and neurologic examination, review of laboratory studies,  Personal review of imaging studies, reports of other /same  Imaging studies ,  Results of polysomnography/ neurophysiology testing and pre-existing records as far as provided in visit., my  assessment is 20 minutes long.   1) OSA- Patients with REM dependent sleep apnea who also do not benefit from ENT surgery. She uses CPAP at 8 cm with 3 cm EPR , highly compliant - daily use time affected by shifyts work, daytime sleeping post call. . Nasal mask better than nasal pillow. (ONO was negative in 2019) . Dental device did not help as much. 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   So I do not need to make any adjustments her air sense AutoSet machine however will be soon 65 years old I usually check on a home sleep test just before I order a new machine and that will be next year.   She has endorsed the fatigue severity score at 31 points and the Epworth sleepiness score at 4 points, she endorsed 5 out of 15 points on a depression score.  New machine due in 06/2021.  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 I would be setting it to 8 cm water 2 cm EPR. [facility] preferred- talk to [facility] , please.   2) Xanax prescribed by PCP Dr Juleen China. She mentioned use for PTSD ( ?).    3) Wellbutrin used for appetite control- compulsive eating reduction. Main risk factor for OSA is Obesity She lost 40 pounds since October 2017 .  Dr Dennard Nip followed her for weight loss over 18 month - she lost only 10 pounds and was d frustrated. Now on weight watchers, lost 20 pounds she gained during [place] pandemic. Marland Kitchen She has lost weight with exercise, she is not considering bariatric surgery because of her need of NSAIDS with arthritis,    her co morbidities are impaired fasting glucose, fatty liver, morbid obesity.  Dr Orland Dec [facility] at [facility]. These conditions are also promoted by shift work and poor sleep quality , too.   4) shift work Pharmacologist,  This is her second sleep disorder. Wellbutrin  XR seems not to hinder her to go to sleep.  She will  start a night shift rotation 2 days and 2 nights alternating.  5) ADHD/ ADD on Vyvanse, prescribed by PCP- Vyvanse is expensive.   Rv in 10 month with me, Larey Seat, MD .   Asencion Partridge Dohmeier MD  08/07/2020   CC: Billey Chang, MD is now PCP>          Briscoe Deutscher, DO, MS.  [place].

## 2020-08-14 ENCOUNTER — Encounter (HOSPITAL_COMMUNITY): Payer: Self-pay

## 2020-08-14 ENCOUNTER — Ambulatory Visit (HOSPITAL_COMMUNITY): Admission: RE | Admit: 2020-08-14 | Payer: 59 | Source: Ambulatory Visit

## 2020-08-15 MED FILL — METOPROLOL SUCCINATE ER 50: 50 | 90 days supply | Qty: 90 | Fill #2

## 2020-08-18 DIAGNOSIS — M25562 Pain in left knee: Secondary | ICD-10-CM | POA: Diagnosis not present

## 2020-08-19 ENCOUNTER — Ambulatory Visit (HOSPITAL_COMMUNITY)
Admission: RE | Admit: 2020-08-19 | Discharge: 2020-08-19 | Disposition: A | Discharge status: Home / Self Care | Payer: 59 | Source: Ambulatory Visit | Attending: Sports Medicine | Admitting: Sports Medicine

## 2020-08-19 ENCOUNTER — Other Ambulatory Visit: Payer: Self-pay

## 2020-08-19 DIAGNOSIS — M84362A Stress fracture, left tibia, initial encounter for fracture: Secondary | ICD-10-CM | POA: Diagnosis not present

## 2020-08-19 DIAGNOSIS — R600 Localized edema: Secondary | ICD-10-CM | POA: Diagnosis not present

## 2020-08-19 DIAGNOSIS — M79605 Pain in left leg: Secondary | ICD-10-CM | POA: Diagnosis not present

## 2020-08-21 ENCOUNTER — Encounter: Payer: Self-pay | Admitting: Cardiovascular Disease

## 2020-08-21 ENCOUNTER — Other Ambulatory Visit: Payer: Self-pay

## 2020-08-21 ENCOUNTER — Other Ambulatory Visit: Payer: Self-pay | Admitting: *Deleted

## 2020-08-21 ENCOUNTER — Ambulatory Visit (INDEPENDENT_AMBULATORY_CARE_PROVIDER_SITE_OTHER): Payer: 59 | Admitting: Cardiovascular Disease

## 2020-08-21 ENCOUNTER — Other Ambulatory Visit (HOSPITAL_BASED_OUTPATIENT_CLINIC_OR_DEPARTMENT_OTHER): Payer: Self-pay

## 2020-08-21 VITALS — BP 160/104 | HR 64 | Ht 66.0 in | Wt 258.0 lb

## 2020-08-21 DIAGNOSIS — I10 Essential (primary) hypertension: Secondary | ICD-10-CM

## 2020-08-21 DIAGNOSIS — I471 Supraventricular tachycardia: Secondary | ICD-10-CM

## 2020-08-21 MED ORDER — HYDROCHLOROTHIAZIDE 25 MG PO TABS
25.0000 mg | ORAL_TABLET | Freq: Every day | ORAL | 3 refills | Status: DC
  Filled 2020-08-21: qty 90, 90d supply, fill #0
  Filled 2020-11-23: qty 90, 90d supply, fill #1
  Filled 2021-02-27: qty 90, 90d supply, fill #2
  Filled 2021-06-07: qty 90, 90d supply, fill #3

## 2020-08-21 NOTE — Patient Instructions (Signed)
Medication Instructions:  Your physician has recommended you make the following change in your medication:  1.) start hctz (hydrochlorothiazide) 25 mg - one tablet daily  *If you need a refill on your cardiac medications before your next appointment, please call your pharmacy*   Lab Work: BMET 7-10 days after starting hctz  If you have labs (blood work) drawn today and your tests are completely normal, you will receive your results only by: Marland Kitchen MyChart Message (if you have MyChart) OR . A paper copy in the mail If you have any lab test that is abnormal or we need to change your treatment, we will call you to review the results.   Testing/Procedures: none   Follow-Up: At [facility], you and your health needs are our priority.  As part of our continuing mission to provide you with exceptional heart care, we have created designated Provider Care Teams.  These Care Teams include your primary Cardiologist (physician) and Advanced Practice Providers (APPs -  Physician Assistants and Nurse Practitioners) who all work together to provide you with the care you need, when you need it.    Your next appointment:   12 month(s)  The format for your next appointment:   In Person  Provider:   You may see Lauree Chandler, MD or one of the following Advanced Practice Providers on your designated Care Team:    Melina Copa, PA-C  Ermalinda Barrios, PA-C

## 2020-08-21 NOTE — Progress Notes (Signed)
Chief Complaint  Patient presents with  . Follow-up    History of SVT     History of Present Illness: 65 yo female with history of HTN, HLD, SVT, PACs and irritable bowel syndrome who is here today for cardiac follow up. 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 I saw her as a new patient May 2013 to establish cardiology care. She has been followed in [facility] with Dr. Linus Salmons. She has had several stress tests that were normal with negative stress echo in 2003, 2005 and a normal stress myoview in 2009. She has had chronic chest pain as well as irritable bowel syndrome. She has history of PACs that are controlled with Toprol. She also has esophagitis and had an esophageal dilatation 2014. She was noted to have atrial fib/SVT when admitted with urosepsis in 2017. Cardiac event monitor in 2017 with no evidence of atrial fibrillation or SVT. Coronary CTA in 2017 showed no evidence of CAD with calcium score of zero. Echo May 2017 with normal LV size and function. No valve disease.   She is here today for follow up. The patient denies any chest pain, dyspnea, palpitations, lower extremity edema, orthopnea, PND, dizziness, near syncope or syncope. Her hands have felt a little swollen. No change in rare LE edema.     Primary Care Physician: Leamon Arnt, MD   Past Medical History:  Diagnosis Date  . ADD (attention deficit disorder)   . Allergy   . 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 Chronic depressive disorder   . Circadian rhythm sleep disorder, shift work   . 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 Diabetes ([place])   . Diverticulosis    Minimal  . GERD (gastroesophageal reflux disease)   . Hepatic steatosis   . History of hiatal hernia 2017   Small  . HLA B27 (HLA B27 positive)   . Hx of pancreatitis   . Hyperlipidemia   . Hypertension   . IBS (irritable bowel syndrome)   . Lumbar scoliosis 2010   Mild  . Migraine headache without aura 05/10/2015  .  Nephrolithiasis 2014   Left, minimal  . Obesity due to excess calories with serious comorbidity 05/24/2019  . Right bundle branch block   . Sepsis ([place]) 01/2016  . Vitamin D deficiency     Past Surgical History:  Procedure Laterality Date  . BLADDER SURGERY    . BREAST BIOPSY Left   . CERVICAL CONE BIOPSY    . CHOLECYSTECTOMY N/A 10/26/2014   Procedure: LAPAROSCOPIC CHOLECYSTECTOMY;  Surgeon: Coralie Keens, MD;  Location: [facility];  Service: General;  Laterality: N/A;  . COLONOSCOPY W/ BIOPSIES  multiple  . CYSTOSCOPY WITH RETROGRADE PYELOGRAM, URETEROSCOPY AND STENT PLACEMENT Left 06/26/2018   Procedure: CYSTOSCOPY WITH RETROGRADE PYELOGRAM, URETEROSCOPY AND STENT PLACEMENT;  Surgeon: Alexis Frock, MD;  Location: [facility];  Service: Urology;  Laterality: Left;  . DIAGNOSTIC LAPAROSCOPY    . DILATION AND CURETTAGE OF UTERUS  07,11  . DUPUYTREN CONTRACTURE RELEASE    . ESOPHAGOGASTRODUODENOSCOPY  multiple  . EYE SURGERY    . HOLMIUM LASER APPLICATION Left 05/23/4313   Procedure: HOLMIUM LASER APPLICATION;  Surgeon: Alexis Frock, MD;  Location[facility];  Service: Urology;  Laterality: Left;  . KNEE ARTHROSCOPY Left 11/2008  . LAPAROSCOPIC VAGINAL HYSTERECTOMY WITH SALPINGO OOPHORECTOMY Bilateral 10/03/2015   Procedure: LAPAROSCOPIC ASSISTED VAGINAL HYSTERECTOMY WITH SALPINGECTOMY;  Surgeon: Eldred Manges, MD;  Location:  [facility];  Service: Gynecology;  Laterality: Bilateral;  . LASIK  1990   RK  . LEFT FINGER SURGERY    . LIVER BIOPSY    . MASS EXCISION Left 12/24/2012   Procedure: LEFT EXCISION OF PALMAR MASS X TWO;  Surgeon: Roseanne Kaufman, MD;  Location: [place];  Service: Orthopedics;  Laterality: Left;  . PHOTOREFRACTIVE KERATOTOMY  1994    Current Outpatient Medications  Medication Sig Dispense Refill  . amphetamine-dextroamphetamine (ADDERALL) 10 MG tablet Take 1 tablet (10 mg total) by mouth daily. 30  tablet 0  . aspirin 325 MG tablet Take 325 mg by mouth daily.    . benzonatate (TESSALON) 200 MG capsule TAKE 1 CAPSULE BY MOUTH THREE TIMES DAILY AS NEEDED FOR COUGH 15 capsule 0  . Blood Glucose Monitoring Suppl (FREESTYLE LITE) DEVI Check blood sugar 2-3 times per day.  Dx:E11.21 1 each 0  . cholecalciferol (VITAMIN D) 1000 UNITS tablet Take 2,000 Units by mouth daily.     . cyclobenzaprine (FLEXERIL) 10 MG tablet Take 1 tablet (10 mg total) by mouth 3 (three) times daily as needed for muscle spasms. 30 tablet 1  . dexamethasone 0.5 MG/5ML elixir RINSE WITH 5 ML FOR 1 MINUTE 4 TIMES A DAY, THEN EXPECTORATE. 500 mL 0  . diclofenac sodium (VOLTAREN) 1 % GEL APPLY 2 GRAMS TOPICALLY 2 TIMES DAILY AS NEEDED. NOTE: 2 GRAMS IS EQUIVALENT TO A NICKEL SIZED AMOUNT.    . hydrochlorothiazide (HYDRODIURIL) 25 MG tablet Take 1 tablet (25 mg total) by mouth daily. 90 tablet 3  . losartan (COZAAR) 100 MG tablet TAKE 1 TABLET (100 MG TOTAL) BY MOUTH DAILY. 90 tablet 3  . metoprolol succinate (TOPROL-XL) 50 MG 24 hr tablet TAKE 1 TABLET BY MOUTH DAILY WITH OR IMMEDIATELY FOLLOWING A MEAL 90 tablet 3  . Omega-3 1000 MG CAPS Take 1 capsule by mouth daily.     . predniSONE (DELTASONE) 10 MG tablet TAKE 6 TABS BY MOUTH ON DAY 1; 5 TABS ON DAY 2; 4 TABS ON DAY 3; 3 TABS ON DAY 4; 2 TABS ON DAY 5; 1 TAB ON DAY 6 THEN STOP 21 tablet 0  . rosuvastatin (CRESTOR) 10 MG tablet TAKE 1 TABLET (10 MG TOTAL) BY MOUTH DAILY. 90 tablet 3  . Turmeric 450 MG CAPS Take 450 mg by mouth daily.      No current facility-administered medications for this visit.    Allergies  Allergen Reactions  . Ace Inhibitors Other (See Comments)    coughing  . Oxycodone-Acetaminophen Nausea And Vomiting  . Vicodin [Hydrocodone-Acetaminophen] Nausea And Vomiting  . Victoza [Liraglutide] Other (See Comments)    pancreatitis  . Adhesive [Tape] Rash    Not on stomach    Social History   Socioeconomic History  . Marital status: Divorced     Spouse name: Not on file  . Number of children: 1  . Years of education: Not on file  . Highest education level: Not on file  Occupational History  . 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 Smoking status: Never Smoker  . Smokeless tobacco: Never Used  Vaping Use  . Vaping Use: Never used  Substance and Sexual Activity  . Alcohol use: Yes    Alcohol/week: 0.0 standard drinks    Comment: social  . Drug use: No  . Sexual activity: Yes    Birth control/protection: Surgical  Other Topics Concern  . 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   Social Determinants of Health   Financial Resource Strain: Not on file  Food Insecurity: Not on file  Transportation Needs: Not on file  Physical Activity: Not on file  Stress: Not on file  Social Connections: Not on file  Intimate Partner Violence: Not on file    Family History  Problem Relation Age of Onset  . Heart attack Father   . Heart disease Father   . Coronary artery disease Mother   . Hypertension Mother   . Heart disease Mother   . Heart attack Mother   . Diabetes Mother   . Hyperlipidemia Mother   . Kidney disease Mother   . Depression Mother   . Sleep apnea Mother   . Obesity Mother   . Hypertension Sister   . Hypertension Brother   . Colon cancer Neg Hx     Review of Systems:  As stated in the HPI and otherwise negative.   BP (!) 160/104   Pulse 64   Ht '5\' 6"'  (1.676 m)   Wt 258 lb (117 kg)   LMP 05/20/2008   SpO2 98%   BMI 41.64 kg/m   Physical Examination: General: Well developed, well nourished, NAD  HEENT: OP clear, mucus membranes moist  SKIN: warm, dry. No rashes. Neuro: No focal deficits  Musculoskeletal: Muscle strength 5/5 all ext  Psychiatric: Mood and affect normal  Neck: No JVD, no carotid bruits, no thyromegaly, no lymphadenopathy.  Lungs:Clear bilaterally, no wheezes, rhonci,  crackles Cardiovascular: Regular rate and rhythm. No murmurs, gallops or rubs. Abdomen:Soft. Bowel sounds present. Non-tender.  Extremities: No lower extremity edema. Pulses are 2 + in the bilateral DP/PT.  Echo May 2017: Left ventricle: The cavity size was normal. Systolic function was   normal. The estimated ejection fraction was in the range of 60%   to 65%. Wall motion was normal; there were no regional wall   motion abnormalities. Left ventricular diastolic function   parameters were normal. - Left atrium: The atrium was mildly dilated.  EKG:  EKG is ordered today. The ekg ordered today demonstrates sinus, IVCD. Unchanged  Recent Labs: 08/30/2019: ALT 14; Hemoglobin 13.4; Platelets 224.0 05/05/2020: BUN 20; Creat 0.76; Potassium 4.6; Sodium 141   Lipid Panel    Component Value Date/Time   CHOL 131 08/30/2019 0836   CHOL 144 03/13/2017 0813   TRIG 72.0 08/30/2019 0836   HDL 35.50 (L) 08/30/2019 0836   HDL 41 03/13/2017 0813   CHOLHDL 4 08/30/2019 0836   VLDL 14.4 08/30/2019 0836   LDLCALC 81 08/30/2019 0836   LDLCALC 78 03/13/2017 0813   LDLDIRECT 118.0 05/24/2019 1615     Wt Readings from Last 3 Encounters:  08/21/20 258 lb (117 kg)  08/07/20 258 lb (117 kg)  05/11/20 240 lb (108.9 kg)     Other studies Reviewed: Additional studies/ records that were reviewed today include: . Review of the above records demonstrates:   Assessment and Plan:   1. Chest pain: No recent chest pain. Coronary CTA in 2017 with no evidence of CAD. LV systolic function normal by echo in 2017.   2. SVT/PACs: She has rare palpitations. Will continue Toprol.   3. HTN: BP has been controlled mostly at home. BP elevated today. She also has mild fluid retention. Will start HCTZ 25 mg daily. BMET one week  Current medicines are reviewed at length with the patient today.  The  patient does not have concerns regarding medicines.  The following changes have been made:  no change  Labs/ tests  ordered today include:   Orders Placed This Encounter  Procedures  . Basic metabolic panel  . EKG 12-Lead    Disposition:   F/U with me in 12 months   Signed, Lauree Chandler, MD 08/21/2020 12:31 PM    [address], [place], [place]  [address] Phone: [phone]; Fax: [phone]

## 2020-09-11 ENCOUNTER — Other Ambulatory Visit (HOSPITAL_BASED_OUTPATIENT_CLINIC_OR_DEPARTMENT_OTHER): Payer: Self-pay

## 2020-09-11 MED FILL — Rosuvastatin Calcium Tab 10 MG: ORAL | 90 days supply | Qty: 90 | Fill #0 | Status: CN

## 2020-09-13 ENCOUNTER — Encounter: Payer: Self-pay | Admitting: Family Medicine

## 2020-09-14 DIAGNOSIS — M170 Bilateral primary osteoarthritis of knee: Secondary | ICD-10-CM | POA: Diagnosis not present

## 2020-09-14 NOTE — Telephone Encounter (Signed)
Please let her know that I'd be happy to see her sister and get name and number so front staff can schedule; or let staff know I will take her since she is family. thanks

## 2020-09-19 ENCOUNTER — Other Ambulatory Visit (HOSPITAL_BASED_OUTPATIENT_CLINIC_OR_DEPARTMENT_OTHER): Payer: Self-pay

## 2020-09-19 MED FILL — Rosuvastatin Calcium Tab 10 MG: ORAL | 90 days supply | Qty: 90 | Fill #0 | Status: AC

## 2020-09-20 ENCOUNTER — Other Ambulatory Visit (HOSPITAL_BASED_OUTPATIENT_CLINIC_OR_DEPARTMENT_OTHER): Payer: Self-pay

## 2020-09-27 ENCOUNTER — Other Ambulatory Visit (HOSPITAL_BASED_OUTPATIENT_CLINIC_OR_DEPARTMENT_OTHER): Payer: Self-pay

## 2020-09-27 ENCOUNTER — Other Ambulatory Visit: Payer: Self-pay | Admitting: Family Medicine

## 2020-09-27 DIAGNOSIS — M1388 Other specified arthritis, other site: Secondary | ICD-10-CM | POA: Diagnosis not present

## 2020-09-27 DIAGNOSIS — M5136 Other intervertebral disc degeneration, lumbar region: Secondary | ICD-10-CM | POA: Diagnosis not present

## 2020-09-27 MED ORDER — PREDNISONE 10 MG PO TABS
ORAL_TABLET | ORAL | 0 refills | Status: DC
  Filled 2020-09-27: qty 21, 6d supply, fill #0

## 2020-09-27 MED ORDER — LIDOCAINE 5 % EX PTCH
MEDICATED_PATCH | CUTANEOUS | 0 refills | Status: DC
  Filled 2020-09-27: qty 15, 15d supply, fill #0

## 2020-09-27 MED ORDER — METHOCARBAMOL 500 MG PO TABS
ORAL_TABLET | ORAL | 0 refills | Status: DC
  Filled 2020-09-27: qty 14, 7d supply, fill #0

## 2020-09-28 ENCOUNTER — Other Ambulatory Visit (HOSPITAL_BASED_OUTPATIENT_CLINIC_OR_DEPARTMENT_OTHER): Payer: Self-pay

## 2020-09-28 MED ORDER — CYCLOBENZAPRINE HCL 10 MG PO TABS
10.0000 mg | ORAL_TABLET | Freq: Three times a day (TID) | ORAL | 1 refills | Status: DC | PRN
  Filled 2020-09-28: qty 30, 10d supply, fill #0

## 2020-10-02 ENCOUNTER — Other Ambulatory Visit (HOSPITAL_BASED_OUTPATIENT_CLINIC_OR_DEPARTMENT_OTHER): Payer: Self-pay

## 2020-10-03 ENCOUNTER — Other Ambulatory Visit (HOSPITAL_BASED_OUTPATIENT_CLINIC_OR_DEPARTMENT_OTHER): Payer: Self-pay

## 2020-10-03 MED ORDER — METHOCARBAMOL 500 MG PO TABS
ORAL_TABLET | ORAL | 0 refills | Status: DC
  Filled 2020-10-03: qty 14, 7d supply, fill #0

## 2020-10-04 ENCOUNTER — Other Ambulatory Visit (HOSPITAL_BASED_OUTPATIENT_CLINIC_OR_DEPARTMENT_OTHER): Payer: Self-pay

## 2020-10-06 ENCOUNTER — Other Ambulatory Visit (HOSPITAL_BASED_OUTPATIENT_CLINIC_OR_DEPARTMENT_OTHER): Payer: Self-pay

## 2020-10-10 ENCOUNTER — Other Ambulatory Visit (HOSPITAL_BASED_OUTPATIENT_CLINIC_OR_DEPARTMENT_OTHER): Payer: Self-pay

## 2020-10-17 ENCOUNTER — Other Ambulatory Visit: Payer: Self-pay | Admitting: Family Medicine

## 2020-10-17 ENCOUNTER — Other Ambulatory Visit (HOSPITAL_BASED_OUTPATIENT_CLINIC_OR_DEPARTMENT_OTHER): Payer: Self-pay

## 2020-10-17 MED FILL — Losartan Potassium Tab 100 MG: ORAL | 90 days supply | Qty: 90 | Fill #0 | Status: AC

## 2020-10-18 ENCOUNTER — Other Ambulatory Visit (HOSPITAL_BASED_OUTPATIENT_CLINIC_OR_DEPARTMENT_OTHER): Payer: Self-pay

## 2020-10-23 DIAGNOSIS — M1712 Unilateral primary osteoarthritis, left knee: Secondary | ICD-10-CM | POA: Diagnosis not present

## 2020-10-23 DIAGNOSIS — M25562 Pain in left knee: Secondary | ICD-10-CM | POA: Diagnosis not present

## 2020-10-26 DIAGNOSIS — M5416 Radiculopathy, lumbar region: Secondary | ICD-10-CM | POA: Diagnosis not present

## 2020-10-30 DIAGNOSIS — M25562 Pain in left knee: Secondary | ICD-10-CM | POA: Diagnosis not present

## 2020-10-30 DIAGNOSIS — M1712 Unilateral primary osteoarthritis, left knee: Secondary | ICD-10-CM | POA: Diagnosis not present

## 2020-10-31 ENCOUNTER — Other Ambulatory Visit (HOSPITAL_BASED_OUTPATIENT_CLINIC_OR_DEPARTMENT_OTHER): Payer: Self-pay

## 2020-10-31 DIAGNOSIS — M5136 Other intervertebral disc degeneration, lumbar region: Secondary | ICD-10-CM | POA: Diagnosis not present

## 2020-10-31 MED ORDER — TIZANIDINE HCL 4 MG PO TABS
ORAL_TABLET | ORAL | 3 refills | Status: DC
  Filled 2020-10-31: qty 30, 10d supply, fill #0

## 2020-11-06 DIAGNOSIS — M5416 Radiculopathy, lumbar region: Secondary | ICD-10-CM | POA: Diagnosis not present

## 2020-11-06 DIAGNOSIS — M1712 Unilateral primary osteoarthritis, left knee: Secondary | ICD-10-CM | POA: Diagnosis not present

## 2020-11-07 ENCOUNTER — Other Ambulatory Visit (HOSPITAL_BASED_OUTPATIENT_CLINIC_OR_DEPARTMENT_OTHER): Payer: Self-pay

## 2020-11-07 MED FILL — Metoprolol Succinate Tab ER 24HR 50 MG (Tartrate Equiv): ORAL | 90 days supply | Qty: 90 | Fill #0 | Status: AC

## 2020-11-08 DIAGNOSIS — M5416 Radiculopathy, lumbar region: Secondary | ICD-10-CM | POA: Insufficient documentation

## 2020-11-13 ENCOUNTER — Ambulatory Visit (INDEPENDENT_AMBULATORY_CARE_PROVIDER_SITE_OTHER): Payer: 59 | Admitting: Family Medicine

## 2020-11-13 ENCOUNTER — Encounter: Payer: Self-pay | Admitting: Family Medicine

## 2020-11-13 ENCOUNTER — Encounter: Payer: 59 | Admitting: Family Medicine

## 2020-11-13 ENCOUNTER — Other Ambulatory Visit: Payer: Self-pay

## 2020-11-13 VITALS — BP 143/88 | HR 60 | Temp 98.0°F | Ht 66.0 in | Wt 258.8 lb

## 2020-11-13 DIAGNOSIS — G4733 Obstructive sleep apnea (adult) (pediatric): Secondary | ICD-10-CM

## 2020-11-13 DIAGNOSIS — E559 Vitamin D deficiency, unspecified: Secondary | ICD-10-CM

## 2020-11-13 DIAGNOSIS — I471 Supraventricular tachycardia: Secondary | ICD-10-CM

## 2020-11-13 DIAGNOSIS — F902 Attention-deficit hyperactivity disorder, combined type: Secondary | ICD-10-CM | POA: Diagnosis not present

## 2020-11-13 DIAGNOSIS — Z1231 Encounter for screening mammogram for malignant neoplasm of breast: Secondary | ICD-10-CM

## 2020-11-13 DIAGNOSIS — D6861 Antiphospholipid syndrome: Secondary | ICD-10-CM

## 2020-11-13 DIAGNOSIS — R7301 Impaired fasting glucose: Secondary | ICD-10-CM

## 2020-11-13 DIAGNOSIS — Z9989 Dependence on other enabling machines and devices: Secondary | ICD-10-CM

## 2020-11-13 DIAGNOSIS — K760 Fatty (change of) liver, not elsewhere classified: Secondary | ICD-10-CM | POA: Diagnosis not present

## 2020-11-13 DIAGNOSIS — E782 Mixed hyperlipidemia: Secondary | ICD-10-CM

## 2020-11-13 DIAGNOSIS — I10 Essential (primary) hypertension: Secondary | ICD-10-CM

## 2020-11-13 DIAGNOSIS — Z0000 Encounter for general adult medical examination without abnormal findings: Secondary | ICD-10-CM

## 2020-11-13 LAB — CBC WITH DIFFERENTIAL/PLATELET
Basophils Absolute: 0.1 10*3/uL (ref 0.0–0.1)
Basophils Relative: 1.1 % (ref 0.0–3.0)
Eosinophils Absolute: 0.2 10*3/uL (ref 0.0–0.7)
Eosinophils Relative: 3.5 % (ref 0.0–5.0)
HCT: 40.3 % (ref 36.0–46.0)
Hemoglobin: 13.3 g/dL (ref 12.0–15.0)
Lymphocytes Relative: 23.6 % (ref 12.0–46.0)
Lymphs Abs: 1.4 10*3/uL (ref 0.7–4.0)
MCHC: 33.1 g/dL (ref 30.0–36.0)
MCV: 83 fl (ref 78.0–100.0)
Monocytes Absolute: 0.6 10*3/uL (ref 0.1–1.0)
Monocytes Relative: 10.1 % (ref 3.0–12.0)
Neutro Abs: 3.5 10*3/uL (ref 1.4–7.7)
Neutrophils Relative %: 61.7 % (ref 43.0–77.0)
Platelets: 224 10*3/uL (ref 150.0–400.0)
RBC: 4.85 Mil/uL (ref 3.87–5.11)
RDW: 13.2 % (ref 11.5–15.5)
WBC: 5.7 10*3/uL (ref 4.0–10.5)

## 2020-11-13 LAB — COMPREHENSIVE METABOLIC PANEL
ALT: 21 U/L (ref 0–35)
AST: 19 U/L (ref 0–37)
Albumin: 4.1 g/dL (ref 3.5–5.2)
Alkaline Phosphatase: 77 U/L (ref 39–117)
BUN: 18 mg/dL (ref 6–23)
CO2: 26 mEq/L (ref 19–32)
Calcium: 9.1 mg/dL (ref 8.4–10.5)
Chloride: 103 mEq/L (ref 96–112)
Creatinine, Ser: 0.66 mg/dL (ref 0.40–1.20)
GFR: 93.84 mL/min (ref >60.00)
Glucose, Bld: 119 mg/dL — ABNORMAL HIGH (ref 70–99)
Potassium: 3.6 mEq/L (ref 3.5–5.1)
Sodium: 139 mEq/L (ref 135–145)
Total Bilirubin: 0.5 mg/dL (ref 0.2–1.2)
Total Protein: 6.6 g/dL (ref 6.0–8.3)

## 2020-11-13 LAB — HEMOGLOBIN A1C: Hgb A1c MFr Bld: 6.8 % — ABNORMAL HIGH (ref 4.6–6.5)

## 2020-11-13 LAB — LIPID PANEL
Cholesterol: 129 mg/dL (ref 0–200)
HDL: 40.7 mg/dL (ref >39.00)
LDL Cholesterol: 65 mg/dL (ref 0–99)
NonHDL: 88.21
Total CHOL/HDL Ratio: 3
Triglycerides: 118 mg/dL (ref 0.0–149.0)
VLDL: 23.6 mg/dL (ref 0.0–40.0)

## 2020-11-13 LAB — VITAMIN D 25 HYDROXY (VIT D DEFICIENCY, FRACTURES): VITD: 58 ng/mL (ref 30.00–100.00)

## 2020-11-13 LAB — TSH: TSH: 2.14 u[IU]/mL (ref 0.35–4.50)

## 2020-11-13 NOTE — Patient Instructions (Signed)
Please return in 6 months for hypertension follow up.   Please check your blood pressures at home and work and send me the numbers on Mychart. We will adjust medications if needed.   If you have any questions or concerns, please don't hesitate to send me a message via MyChart or call the office at [phone]. Thank you for visiting with Alexandra Walker today! It's our pleasure caring for you.

## 2020-11-13 NOTE — Progress Notes (Signed)
Subjective  Chief Complaint  Patient presents with   Annual Exam    Fasting   ADHD   Hypertension   Hyperlipidemia    HPI: Alexandra Walker is a 65 y.o. female who presents to [facility] today for a Female Wellness Visit. She also has the concerns and/or needs as listed above in the chief complaint. These will be addressed in addition to the Health Maintenance Visit.   Wellness Visit: annual visit with health maintenance review and exam without Pap  HM overdue for mammogram. Other screens are current. Defers shingrix for now.  Chronic disease f/u and/or acute problem visit: (deemed necessary to be done in addition to the wellness visit): Multiple chronic medical problems reviewed individually as listed below. No sxs of hyperglycemia; needs to work on low fat diet. BP control is good she thinks but hasn't been checking it. Readings in office are borderline high. Takes meds consistently. No sxs of CAD or DHF.  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 Pt   Assessment  1. Annual physical exam   2. Essential hypertension   3. IFG (impaired fasting glucose)   4. Mixed hyperlipidemia   5. Morbid obesity ([place])   6. Vitamin D deficiency, on Vit D 2000 IU daily   7. Hepatic steatosis   8. Attention deficit hyperactivity disorder (ADHD), combined type   9. OSA on CPAP, followed by Neurology/Sleep   10. Encounter for screening mammogram for breast cancer   11. Paroxysmal SVT (supraventricular tachycardia) ([place])   12. Anticardiolipin syndrome [facility])      Plan  Female Wellness Visit: Age appropriate Health Maintenance and Prevention measures were discussed with patient. Included topics are cancer screening recommendations, ways to keep healthy (see AVS) including  dietary and exercise recommendations, regular eye and dental care, use of seat belts, and avoidance of moderate alcohol use and tobacco use. Pt ot schedule mammogram  BMI: discussed patient's BMI and encouraged positive lifestyle modifications to help get to or maintain a target BMI. HM needs and immunizations were addressed and ordered. See below for orders. See HM and immunization section for updates.defers shingirx Routine labs and screening tests ordered including cmp, cbc and lipids where appropriate. Discussed recommendations regarding Vit D and calcium supplementation (see AVS)  Chronic disease management visit and/or acute problem visit: HTN: fair control. To check home/work readings and send me numbers; adjust up meds if remains above goal. On arb hctz and bb.  Obesity: rec weight loss Check liver, renal and lipids Monitoring vit D Screen for diabetes Continue bb for psvt/sx control  Follow up: No follow-ups on file.  Orders Placed This Encounter  Procedures   MM DIGITAL SCREENING BILATERAL   CBC with Differential/Platelet   Comprehensive metabolic panel   Lipid panel   TSH   VITAMIN D 25 Hydroxy (Vit-D Deficiency, Fractures)   Hemoglobin A1c   No orders of the defined types were placed in this encounter.     Body mass index is 41.77 kg/m. Wt Readings from Last 3 Encounters:  11/13/20 258 lb 12.8 oz (117.4 kg)  08/21/20 258 lb (117 kg)  08/07/20 258 lb (117 kg)     Patient Active Problem List   Diagnosis Date Noted   Morbid obesity ([place]) 08/07/2020  Priority: High   Attention deficit hyperactivity disorder (ADHD), combined type 10/19/2017    Priority: High   OSA on CPAP, followed by Neurology/Sleep 09/26/2016    Priority: High   Essential hypertension 01/31/2016    Priority: High   IFG (impaired fasting glucose) 01/31/2016    Priority: High   Mixed hyperlipidemia 05/10/2015    Priority: High   Paroxysmal SVT (supraventricular tachycardia) ([place])  09/19/2011    Priority: High    SVT/PACs controlled on BB; managed by cardiology. No CAD     Bilateral hip bursitis 05/06/2019    Priority: Medium   Nephrolithiasis 03/29/2018    Priority: Medium    H/o recurrent pyelo/urosepsis; s/p stone retrieval and stenting.  Has nonobstructive stone that persists.  Dr. Tresa Moore     Anticardiolipin syndrome [facility])     Priority: Medium   Recurrent circadian rhythm sleep disorder, shift work type 03/31/2017    Priority: Medium   IBS (irritable bowel syndrome), diarrhea predominant 03/17/2017    Priority: Medium   Hepatic steatosis 09/26/2016    Priority: Medium   Diverticulosis of colon 05/16/2018    Priority: Low   Drug-induced pancreatitis, 2015, thought to be due to [facility] 03/29/2018    Priority: Low   History of vitreous detachment OU, HTN retinopathy OU, High myopia OU, Cataracts OU, Vitreous floaters OU, Keratoconjunctivitis sicca OU not specified as Sjogren's 03/29/2018    Priority: Low   History of sepsis 02/01/2016    Priority: Low    Due to urosepsis: had acute short lived afib/psvt. Neg cardiac eval since.      Vitamin D deficiency, on Vit D 2000 IU daily 05/10/2015    Priority: Low   Shifting sleep-work schedule, affecting sleep 08/09/2019   Health Maintenance  Topic Date Due   Pneumococcal Vaccine 6-37 Years old (1 - PCV) Never done   Zoster Vaccines- Shingrix (1 of 2) Never done   COVID-19 Vaccine (4 - Booster) 09/03/2019   MAMMOGRAM  05/02/2020   INFLUENZA VACCINE  12/18/2020   COLONOSCOPY (Pts 45-32yr Insurance coverage will need to be confirmed)  05/18/2022   TETANUS/TDAP  10/31/2029   Hepatitis C Screening  Completed   HIV Screening  Completed   HPV VACCINES  Aged Out   Immunization History  Administered Date(s) Administered   Influenza, High Dose Seasonal PF 01/21/2013   Influenza,inj,Quad PF,6+ Mos 01/21/2013, 03/09/2018, 03/19/2019   Influenza,inj,quad, With Preservative 02/18/2015   Influenza-Unspecified  02/18/2015, 02/23/2016, 03/13/2017, 03/20/2020   Moderna Sars-Covid-2 Vaccination 04/20/2019   PFIZER(Purple Top)SARS-COV-2 Vaccination 05/15/2019, 06/05/2019   Pneumococcal Polysaccharide-23 08/01/2008   Rabies, IM 10/14/2011, 10/17/2011, 10/23/2011, 10/31/2011   Rabies, intradermal 10/14/2011   Tdap 06/08/2007, 11/01/2019   We updated and reviewed the patient's past history in detail and it is documented below. Allergies: Patient is allergic to ace inhibitors, oxycodone-acetaminophen, vicodin [hydrocodone-acetaminophen], victoza [liraglutide], and adhesive [tape]. Past Medical History Patient  has a past medical history of ADD (attention deficit disorder), Allergy, 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[place], Chronic depressive disorder, Circadian rhythm sleep disorder, shift work, DDD (degenerative disc disease), lumbosacral (05/2012), Diabetes ([place], Diverticulosis, GERD (gastroesophageal reflux disease), Hepatic steatosis, History of hiatal hernia (2017), HLA B27 (HLA B27 positive), pancreatitis, Hyperlipidemia, Hypertension, IBS (irritable bowel syndrome), Lumbar scoliosis (2010), Migraine headache without aura (05/10/2015), Nephrolithiasis (2014), Obesity due to excess calories with serious comorbidity (05/24/2019), Right bundle branch block, Sepsis ([place] (01/2016), and Vitamin D deficiency. Past Surgical History Patient  has a past surgical history that includes Colonoscopy w/ biopsies (multiple); Esophagogastroduodenoscopy (multiple); Knee  arthroscopy (Left, 11/2008); Dilation and curettage of uterus (07,11); Cervical cone biopsy; Mass excision (Left, 12/24/2012); Diagnostic laparoscopy; Cholecystectomy (N/A, 10/26/2014); Bladder surgery; Laparoscopic vaginal hysterectomy with salpingo oophorectomy (Bilateral, 10/03/2015); LEFT FINGER SURGERY; Breast biopsy (Left); Eye surgery; LASIK (1990); Dupuytren contracture release; Liver biopsy; Photorefractive keratotomy (1994); Cystoscopy with retrograde pyelogram,  ureteroscopy and stent placement (Left, 06/26/2018); and Holmium laser application (Left, 11/20/812). Family History: Patient family history includes Coronary artery disease in her mother; Depression in her mother; Diabetes in her mother; Heart attack in her father and mother; Heart disease in her father and mother; Hyperlipidemia in her mother; Hypertension in her brother, mother, and sister; Kidney disease in her mother; Obesity in her mother; Sleep apnea in her mother. Social History:  Patient  reports that she has never smoked. She has never used smokeless tobacco. She reports current alcohol use. She reports that she does not use drugs.  Review of Systems: Constitutional: negative for fever or malaise Ophthalmic: negative for photophobia, double vision or loss of vision Cardiovascular: negative for chest pain, dyspnea on exertion, or new LE swelling Respiratory: negative for SOB or persistent cough Gastrointestinal: negative for abdominal pain, change in bowel habits or melena Genitourinary: negative for dysuria or gross hematuria, no abnormal uterine bleeding or disharge Musculoskeletal: negative for new gait disturbance or muscular weakness Integumentary: negative for new or persistent rashes, no breast lumps Neurological: negative for TIA or stroke symptoms Psychiatric: negative for SI or delusions Allergic/Immunologic: negative for hives  Patient Care Team    Relationship Specialty Notifications Start End  Leamon Arnt, MD PCP - General Family Medicine  04/07/19   Burnell Blanks, MD PCP - Cardiology Cardiology  08/21/20   Roseanne Kaufman, MD Consulting Physician Orthopedic Surgery  02/11/19   Burnell Blanks, MD Consulting Physician Cardiology  05/24/19   Cornelius Moras, NP Referring Physician Gastroenterology  05/24/19   Dohmeier, Asencion Partridge, MD Consulting Physician Neurology  05/24/19   Alexis Frock, MD Consulting Physician Urology  05/24/19   Gatha Mayer, MD Consulting  Physician Gastroenterology  05/24/19     Objective  Vitals: BP (!) 143/88   Pulse 60   Temp 98 F (36.7 C) (Temporal)   Ht '5\' 6"'  (1.676 m)   Wt 258 lb 12.8 oz (117.4 kg)   LMP 05/20/2008   SpO2 99%   BMI 41.77 kg/m  General:  Well developed, well nourished, no acute distress  Psych:  Alert and orientedx3,normal mood and affect HEENT:  Normocephalic, atraumatic, non-icteric sclera,  supple neck without adenopathy, mass or thyromegaly Cardiovascular:  Normal S1, S2, RRR without gallop, rub or murmur Respiratory:  Good breath sounds bilaterally, CTAB with normal respiratory effort Gastrointestinal: normal bowel sounds, soft, non-tender, no noted masses. No HSM MSK: no deformities, contusions. Joints are without erythema or swelling.  Skin:  Warm, no rashes or suspicious lesions noted Neurologic:    Mental status is normal. CN 2-11 are normal. Gross motor and sensory exams are normal. Normal gait. No tremor Breast Exam: No mass, skin retraction or nipple discharge is appreciated in either breast. No axillary adenopathy. Fibrocystic changes are not noted   Commons side effects, risks, benefits, and alternatives for medications and treatment plan prescribed today were discussed, and the patient expressed understanding of the given instructions. Patient is instructed to call or message via MyChart if he/she has any questions or concerns regarding our treatment plan. No barriers to understanding were identified. We discussed Red Flag symptoms and signs in detail. Patient expressed  understanding regarding what to do in case of urgent or emergency type symptoms.  Medication list was reconciled, printed and provided to the patient in AVS. Patient instructions and summary information was reviewed with the patient as documented in the AVS. This note was prepared with assistance of Dragon voice recognition software. Occasional wrong-word or sound-a-like substitutions may have occurred due to the inherent  limitations of voice recognition software  This visit occurred during the SARS-CoV-2 public health emergency.  Safety protocols were in place, including screening questions prior to the visit, additional usage of staff PPE, and extensive cleaning of exam room while observing appropriate contact time as indicated for disinfecting solutions.

## 2020-11-14 DIAGNOSIS — M5416 Radiculopathy, lumbar region: Secondary | ICD-10-CM | POA: Diagnosis not present

## 2020-11-23 ENCOUNTER — Other Ambulatory Visit (HOSPITAL_BASED_OUTPATIENT_CLINIC_OR_DEPARTMENT_OTHER): Payer: Self-pay

## 2020-11-24 DIAGNOSIS — M5416 Radiculopathy, lumbar region: Secondary | ICD-10-CM | POA: Diagnosis not present

## 2020-11-27 ENCOUNTER — Other Ambulatory Visit (HOSPITAL_COMMUNITY): Payer: Self-pay

## 2020-12-01 DIAGNOSIS — M5416 Radiculopathy, lumbar region: Secondary | ICD-10-CM | POA: Diagnosis not present

## 2020-12-04 ENCOUNTER — Ambulatory Visit (HOSPITAL_BASED_OUTPATIENT_CLINIC_OR_DEPARTMENT_OTHER): Payer: 59

## 2020-12-04 ENCOUNTER — Encounter: Payer: Self-pay | Admitting: Family Medicine

## 2020-12-04 ENCOUNTER — Telehealth: Payer: Self-pay

## 2020-12-04 NOTE — Telephone Encounter (Signed)
Patient tested positive for COVID this morning with productive cough and fever. Wanting to know if any preventative medications can be sent in for her. Please advise

## 2020-12-05 ENCOUNTER — Encounter: Payer: Self-pay | Admitting: Family Medicine

## 2020-12-05 ENCOUNTER — Other Ambulatory Visit (HOSPITAL_BASED_OUTPATIENT_CLINIC_OR_DEPARTMENT_OTHER): Payer: Self-pay

## 2020-12-05 ENCOUNTER — Telehealth (INDEPENDENT_AMBULATORY_CARE_PROVIDER_SITE_OTHER): Payer: 59 | Admitting: Family Medicine

## 2020-12-05 VITALS — HR 72 | Ht 66.0 in

## 2020-12-05 DIAGNOSIS — U071 COVID-19: Secondary | ICD-10-CM

## 2020-12-05 MED ORDER — NIRMATRELVIR/RITONAVIR (PAXLOVID)TABLET
3.0000 | ORAL_TABLET | Freq: Two times a day (BID) | ORAL | 0 refills | Status: AC
  Filled 2020-12-05: qty 30, 5d supply, fill #0

## 2020-12-05 NOTE — Telephone Encounter (Signed)
Patient has been scheduled for a virtual with Dr. [place].

## 2020-12-05 NOTE — Progress Notes (Signed)
Virtual Visit via Video Note I connected with Alexandra Walker on 12/05/20 by a video enabled telemedicine application and verified that I am speaking with the correct person using two identifiers.  Location patient: home Location provider:work office Persons participating in the virtual visit: patient, provider  I discussed the limitations of evaluation and management by telemedicine and the availability of in person appointments. The patient expressed understanding and agreed to proceed.  Chief Complaint  Patient presents with   Covid Positive   HPI: Alexandra Walker is a 65 year old female with history of hypertension, hepatic steatosis, GERD, paroxysmal SVT, OSA on CPAP, and impaired fasting glucose complaining of 2 to 3 days of respiratory symptoms and a positive home COVID-19 test 1 to 2 days ago. She started with symptoms Sunday night, body aches and fatigue. Next day she had fever 101.7 F, nasal congestion,rhinorrhea,and productive cough. Negative for hemoptysis. Chest and back pain when coughing. She has been checking O2 sats and yesterday she was in the low 90's.  Negative for headache, sore throat, anosmia, ageusia, dyspnea, wheezing, palpitations, abdominal pain, nausea, vomiting, changes in bowel habits, urinary symptoms, or skin rash.  She has taking Tylenol and ibuprofen. Today she is feeling better.  COVID-19 vaccination completed, she has not have booster. Negative for sick contacts or recent travel.  SVT and hypertension, she follows with cardiologist annually. She is on metoprolol succinate 50 mg daily, HCTZ 25 mg daily, and losartan 100 mg daily. 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  ROS: See pertinent positives and negatives per HPI.  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   Diabetes ([place])    Diverticulosis    Minimal   GERD (gastroesophageal reflux disease)    Hepatic steatosis    History of hiatal hernia 2017   Small   HLA B27 (HLA B27 positive)    Hx of pancreatitis    Hyperlipidemia    Hypertension    IBS (irritable bowel syndrome)    Lumbar scoliosis 2010   Mild   Migraine headache without aura 05/10/2015   Nephrolithiasis 2014   Left, minimal   Obesity due to excess calories with serious comorbidity 05/24/2019   Right bundle branch block    Sepsis ([place]) 01/2016   Vitamin D deficiency    Past Surgical History:  Procedure Laterality Date   BLADDER SURGERY     BREAST BIOPSY Left    CERVICAL CONE BIOPSY     CHOLECYSTECTOMY N/A 10/26/2014   Procedure: LAPAROSCOPIC CHOLECYSTECTOMY;  Surgeon: Coralie Keens, MD;  Location: [place];  Service: General;  Laterality: N/A;   COLONOSCOPY W/ BIOPSIES  multiple   CYSTOSCOPY WITH RETROGRADE PYELOGRAM, URETEROSCOPY AND STENT PLACEMENT Left 06/26/2018   Procedure: CYSTOSCOPY WITH RETROGRADE PYELOGRAM, URETEROSCOPY AND STENT PLACEMENT;  Surgeon: Alexis Frock, MD;  Location: [facility];  Service: Urology;  Laterality: Left;   DIAGNOSTIC LAPAROSCOPY     DILATION AND CURETTAGE OF UTERUS  07,11   DUPUYTREN CONTRACTURE RELEASE     ESOPHAGOGASTRODUODENOSCOPY  multiple   EYE SURGERY     HOLMIUM LASER APPLICATION Left 1/0/6269   Procedure: HOLMIUM LASER APPLICATION;  Surgeon: Alexis Frock, MD;  Location: [facility];  Service: Urology;  Laterality: Left;   KNEE ARTHROSCOPY Left 11/2008  LAPAROSCOPIC VAGINAL HYSTERECTOMY WITH SALPINGO OOPHORECTOMY Bilateral 10/03/2015   Procedure: LAPAROSCOPIC ASSISTED VAGINAL HYSTERECTOMY WITH SALPINGECTOMY;  Surgeon: Eldred Manges, MD;  Location: [facility];  Service: Gynecology;  Laterality: Bilateral;   LASIK  1990   RK   LEFT FINGER SURGERY     LIVER BIOPSY     MASS EXCISION Left  12/24/2012   Procedure: LEFT EXCISION OF PALMAR MASS X TWO;  Surgeon: Roseanne Kaufman, MD;  Location: [facility];  Service: Orthopedics;  Laterality: Left;   PHOTOREFRACTIVE KERATOTOMY  1994    Family History  Problem Relation Age of Onset   Heart attack Father    Heart disease Father    Coronary artery disease Mother    Hypertension Mother    Heart disease Mother    Heart attack Mother    Diabetes Mother    Hyperlipidemia Mother    Kidney disease Mother    Depression Mother    Sleep apnea Mother    Obesity Mother    Hypertension Sister    Hypertension Brother    Colon cancer Neg Hx     Social History   Socioeconomic History   Marital status: Divorced    Spouse name: Not on file   Number of children: 1   Years of education: Not on file   Highest education level: Not on file  Occupational History   Occupation: [employer]    Employer: [employer]  Tobacco Use   Smoking status: Never   Smokeless tobacco: Never  Vaping Use   Vaping Use: Never used  Substance and Sexual Activity   Alcohol use: Yes    Alcohol/week: 0.0 standard drinks    Comment: social   Drug use: No   Sexual activity: Yes    Birth control/protection: Surgical  Other Topics Concern   Not on file  Social History Narrative   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   Social Determinants of Health   Financial Resource Strain: Not on file  Food Insecurity: Not on file  Transportation Needs: Not on file  Physical Activity: Not on file  Stress: Not on file  Social Connections: Not on file  Intimate Partner Violence: Not on file   Current Outpatient Medications:    amphetamine-dextroamphetamine (ADDERALL) 10 MG tablet, Take 1 tablet (10 mg total) by mouth daily. (Patient taking differently: Take 10 mg by mouth daily as needed.), Disp: 30 tablet, Rfl: 0   aspirin 325 MG tablet, Take 325 mg by mouth daily., Disp: , Rfl:    cholecalciferol (VITAMIN D) 1000 UNITS  tablet, Take 2,000 Units by mouth daily. , Disp: , Rfl:    dexamethasone 0.5 MG/5ML elixir, RINSE WITH 5 ML FOR 1 MINUTE 4 TIMES A DAY, THEN EXPECTORATE., Disp: 500 mL, Rfl: 0   diclofenac sodium (VOLTAREN) 1 % GEL, APPLY 2 GRAMS TOPICALLY 2 TIMES DAILY AS NEEDED. NOTE: 2 GRAMS IS EQUIVALENT TO A NICKEL SIZED AMOUNT., Disp: , Rfl:    hydrochlorothiazide (HYDRODIURIL) 25 MG tablet, Take 1 tablet (25 mg total) by mouth daily., Disp: 90 tablet, Rfl: 3   losartan (COZAAR) 100 MG tablet, TAKE 1 TABLET (100 MG TOTAL) BY MOUTH DAILY., Disp: 90 tablet, Rfl: 3   metoprolol succinate (TOPROL-XL) 50 MG 24 hr tablet, TAKE 1 TABLET BY MOUTH DAILY WITH OR IMMEDIATELY FOLLOWING A MEAL, Disp: 90 tablet, Rfl: 3   Omega-3 1000 MG CAPS, Take 1 capsule by mouth daily. , Disp: , Rfl:    rosuvastatin (  CRESTOR) 10 MG tablet, TAKE 1 TABLET (10 MG TOTAL) BY MOUTH DAILY., Disp: 90 tablet, Rfl: 3   tiZANidine (ZANAFLEX) 4 MG tablet, Take 1 tablet 3 times a day by mouth as needed., Disp: 30 tablet, Rfl: 3   Turmeric 450 MG CAPS, Take 450 mg by mouth daily. , Disp: , Rfl:   EXAM:  VITALS per patient if applicable:Pulse 72   Ht _0  (1.676 m)   LMP 05/20/2008   SpO2 97%   BMI 41.77 kg/m   GENERAL: alert, oriented, appears well and in no acute distress  HEENT: atraumatic, conjunctiva clear, no obvious abnormalities on inspection of external nose and ears  NECK: normal movements of the head and neck  LUNGS: on inspection no signs of respiratory distress, breathing rate appears normal, no obvious gross SOB, gasping or wheezing  CV: no obvious cyanosis  MS: moves all visible extremities without noticeable abnormality  PSYCH/NEURO: pleasant and cooperative, no obvious depression or anxiety, speech and thought processing grossly intact  ASSESSMENT AND PLAN:  Discussed the following assessment and plan:  COVID-19 virus infection - Plan: nirmatrelvir/ritonavir EUA (PAXLOVID) TABS We discussed Dx, possible  complications and treatment options. O2 sats have improved and based on hx I do not think CXR is needed today. She would like to try oral antiviral, Paxlovid. We read side effects and discussed risk of interaction with some of her meds. Tylenol 500 mg every 6 hours as needed.  Adequate hydration and rest. Explained that cough and congestion can last a few more days and even weeks after acute symptoms have resolved.  7 days quarantine from symptoms onset recommended.  Reporting hx of 1st degree AV block. Instructed to monitor BP and HR closely while taking medication. Clearly instructed about warning signs.  We discussed possible serious and likely etiologies, options for evaluation and workup, limitations of telemedicine visit vs in person visit, treatment, treatment risks and precautions. She is to call back or seek an in-person evaluation if the symptoms worsen or if the condition fails to improve as anticipated. I discussed the assessment and treatment plan with the patient.  Alexandra Walker was provided an opportunity to ask questions and all were answered.  She agreed with the plan and demonstrated an understanding of the instructions.  Return if symptoms worsen or fail to improve.  Marlow Berenguer Martinique, MD

## 2020-12-05 NOTE — Telephone Encounter (Signed)
Pt was called and scheduled for today.

## 2020-12-05 NOTE — Telephone Encounter (Signed)
Please call pt and schedule virtual visit per Dr. Jonni Sanger. Pt has COVID, discuss treatment.

## 2020-12-07 NOTE — Telephone Encounter (Signed)
Please see message. °

## 2020-12-26 ENCOUNTER — Other Ambulatory Visit (HOSPITAL_BASED_OUTPATIENT_CLINIC_OR_DEPARTMENT_OTHER): Payer: Self-pay

## 2020-12-26 MED FILL — Rosuvastatin Calcium Tab 10 MG: ORAL | 90 days supply | Qty: 90 | Fill #1 | Status: AC

## 2021-01-01 ENCOUNTER — Ambulatory Visit (HOSPITAL_BASED_OUTPATIENT_CLINIC_OR_DEPARTMENT_OTHER)
Admission: RE | Admit: 2021-01-01 | Discharge: 2021-01-01 | Disposition: A | Discharge status: Home / Self Care | Payer: 59 | Source: Ambulatory Visit | Attending: Family Medicine | Admitting: Family Medicine

## 2021-01-01 ENCOUNTER — Other Ambulatory Visit: Payer: Self-pay

## 2021-01-01 ENCOUNTER — Encounter (HOSPITAL_BASED_OUTPATIENT_CLINIC_OR_DEPARTMENT_OTHER): Payer: Self-pay

## 2021-01-01 DIAGNOSIS — Z1231 Encounter for screening mammogram for malignant neoplasm of breast: Secondary | ICD-10-CM | POA: Insufficient documentation

## 2021-01-01 DIAGNOSIS — Z0000 Encounter for general adult medical examination without abnormal findings: Secondary | ICD-10-CM

## 2021-01-22 ENCOUNTER — Other Ambulatory Visit: Payer: Self-pay | Admitting: Family Medicine

## 2021-01-22 MED FILL — Losartan Potassium Tab 100 MG: ORAL | 90 days supply | Qty: 90 | Fill #1 | Status: AC

## 2021-01-23 ENCOUNTER — Other Ambulatory Visit (HOSPITAL_BASED_OUTPATIENT_CLINIC_OR_DEPARTMENT_OTHER): Payer: Self-pay

## 2021-01-23 MED ORDER — AMPHETAMINE-DEXTROAMPHETAMINE 10 MG PO TABS
10.0000 mg | ORAL_TABLET | Freq: Every day | ORAL | 0 refills | Status: DC
  Filled 2021-01-23: qty 30, 30d supply, fill #0

## 2021-01-23 NOTE — Telephone Encounter (Signed)
Last Refill 08/30/2019 Last OV 11/13/2020 dx Physical exam

## 2021-02-12 ENCOUNTER — Ambulatory Visit (INDEPENDENT_AMBULATORY_CARE_PROVIDER_SITE_OTHER): Payer: 59 | Admitting: Otolaryngology

## 2021-02-12 ENCOUNTER — Other Ambulatory Visit: Payer: Self-pay

## 2021-02-12 DIAGNOSIS — H6123 Impacted cerumen, bilateral: Secondary | ICD-10-CM

## 2021-02-12 NOTE — Progress Notes (Signed)
HPI: Alexandra Walker is a 65 y.o. female who presents is referred by her PCP for evaluation of wax buildup in her ears.  She relates that she has had a previous history of shingles in the left ear and wondered if there is any damage to the ear from shingles.  She also relates that she has history of clenching her teeth and occasionally gets pain deep in the ear.  But she is referred here mostly because of wax buildup but she generally gets cleaned about once a year..  Past Medical History:  Diagnosis Date   ADD (attention deficit disorder)    Allergy    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HCC)    Chronic depressive disorder    Circadian rhythm sleep disorder, shift work    DDD (degenerative disc disease), 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   Diabetes ([place])    Diverticulosis    Minimal   GERD (gastroesophageal reflux disease)    Hepatic steatosis    History of hiatal hernia 2017   Small   HLA B27 (HLA B27 positive)    Hx of pancreatitis    Hyperlipidemia    Hypertension    IBS (irritable bowel syndrome)    Lumbar scoliosis 2010   Mild   Migraine headache without aura 05/10/2015   Nephrolithiasis 2014   Left, minimal   Obesity due to excess calories with serious comorbidity 05/24/2019   Right bundle branch block    Sepsis ([place]) 01/2016   Vitamin D deficiency    Past Surgical History:  Procedure Laterality Date   BLADDER SURGERY     BREAST BIOPSY Left    CERVICAL CONE BIOPSY     CHOLECYSTECTOMY N/A 10/26/2014   Procedure: LAPAROSCOPIC CHOLECYSTECTOMY;  Surgeon: Coralie Keens, MD;  Location: [facility];  Service: General;  Laterality: N/A;   COLONOSCOPY W/ BIOPSIES  multiple   CYSTOSCOPY WITH RETROGRADE PYELOGRAM, URETEROSCOPY AND STENT PLACEMENT Left 06/26/2018   Procedure: CYSTOSCOPY WITH RETROGRADE PYELOGRAM, URETEROSCOPY AND STENT PLACEMENT;  Surgeon: Alexis Frock, MD;  Location: [facility];  Service: Urology;  Laterality: Left;   DIAGNOSTIC LAPAROSCOPY     DILATION  AND CURETTAGE OF UTERUS  07,11   DUPUYTREN CONTRACTURE RELEASE     ESOPHAGOGASTRODUODENOSCOPY  multiple   EYE SURGERY     HOLMIUM LASER APPLICATION Left 06/27/3149   Procedure: HOLMIUM LASER APPLICATION;  Surgeon: Alexis Frock, MD;  Location: [facility];  Service: Urology;  Laterality: Left;   KNEE ARTHROSCOPY Left 11/2008   LAPAROSCOPIC VAGINAL HYSTERECTOMY WITH SALPINGO OOPHORECTOMY Bilateral 10/03/2015   Procedure: LAPAROSCOPIC ASSISTED VAGINAL HYSTERECTOMY WITH SALPINGECTOMY;  Surgeon: Eldred Manges, MD;  Location: [facility];  Service: Gynecology;  Laterality: Bilateral;   LASIK  1990   RK   LEFT FINGER SURGERY     LIVER BIOPSY     MASS EXCISION Left 12/24/2012   Procedure: LEFT EXCISION OF PALMAR MASS X TWO;  Surgeon: Roseanne Kaufman, MD;  Location: [facility];  Service: Orthopedics;  Laterality: Left;   PHOTOREFRACTIVE KERATOTOMY  1994   Social History   Socioeconomic History   Marital status: Divorced    Spouse name: Not on file   Number of children: 1   Years of education: Not on file   Highest education level: Not on file  Occupational History   Occupation: [employer]wife    Employer: [employer]  Tobacco Use   Smoking status: Never   Smokeless tobacco: Never  Vaping Use   Vaping Use: Never  used  Substance and Sexual Activity   Alcohol use: Yes    Alcohol/week: 0.0 standard drinks    Comment: social   Drug use: No   Sexual activity: Yes    Birth control/protection: Surgical  Other Topics Concern   Not on file  Social History Narrative   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   Social Determinants of Health   Financial Resource Strain: Not on file  Food Insecurity: Not on file  Transportation Needs: Not on file  Physical Activity: Not on file  Stress: Not on file  Social Connections: Not on file   Family History  Problem Relation Age of Onset   Heart attack Father    Heart disease Father    Coronary  artery disease Mother    Hypertension Mother    Heart disease Mother    Heart attack Mother    Diabetes Mother    Hyperlipidemia Mother    Kidney disease Mother    Depression Mother    Sleep apnea Mother    Obesity Mother    Hypertension Sister    Hypertension Brother    Colon cancer Neg Hx    Allergies  Allergen Reactions   Ace Inhibitors Other (See Comments)    coughing   Oxycodone-Acetaminophen Nausea And Vomiting   Vicodin [Hydrocodone-Acetaminophen] Nausea And Vomiting   Victoza [Liraglutide] Other (See Comments)    pancreatitis   Adhesive [Tape] Rash    Not on stomach   Prior to Admission medications   Medication Sig Start Date End Date Taking? Authorizing Provider  amphetamine-dextroamphetamine (ADDERALL) 10 MG tablet Take 1 tablet (10 mg total) by mouth daily. 01/23/21   Leamon Arnt, MD  aspirin 325 MG tablet Take 325 mg by mouth daily.    [provider]  cholecalciferol (VITAMIN D) 1000 UNITS tablet Take 2,000 Units by mouth daily.     [provider]  dexamethasone 0.5 MG/5ML elixir RINSE WITH 5 ML FOR 1 MINUTE 4 TIMES A DAY, THEN EXPECTORATE. 04/10/20 04/10/21    diclofenac sodium (VOLTAREN) 1 % GEL APPLY 2 GRAMS TOPICALLY 2 TIMES DAILY AS NEEDED. NOTE: 2 GRAMS IS EQUIVALENT TO A NICKEL SIZED AMOUNT. 05/30/16   [provider]  hydrochlorothiazide (HYDRODIURIL) 25 MG tablet Take 1 tablet (25 mg total) by mouth daily. 08/21/20   Burnell Blanks, MD  losartan (COZAAR) 100 MG tablet TAKE 1 TABLET (100 MG TOTAL) BY MOUTH DAILY. 10/17/20 10/17/21  Leamon Arnt, MD  metoprolol succinate (TOPROL-XL) 50 MG 24 hr tablet TAKE 1 TABLET BY MOUTH DAILY WITH OR IMMEDIATELY FOLLOWING A MEAL 02/28/20 02/27/21  Leamon Arnt, MD  Omega-3 1000 MG CAPS Take 1 capsule by mouth daily.     [provider]  rosuvastatin (CRESTOR) 10 MG tablet TAKE 1 TABLET (10 MG TOTAL) BY MOUTH DAILY. 06/13/20 06/13/21  Leamon Arnt, MD  tiZANidine (ZANAFLEX)  4 MG tablet Take 1 tablet 3 times a day by mouth as needed. 10/31/20     Turmeric 450 MG CAPS Take 450 mg by mouth daily.     [provider]     Positive ROS: Otherwise negative  All other systems have been reviewed and were otherwise negative with the exception of those mentioned in the HPI and as above.  Physical Exam: Constitutional: Alert, well-appearing, no acute distress Ears: External ears without lesions or tenderness.  She has large amount of wax buildup in the right ear canal and minimal wax buildup on  the left side.  Ear canals were cleaned with suction and curettes.  Both ear canals and TMs are otherwise clear.  On hearing screening with the 1024 tuning fork she had a minimal sensorineural hearing loss in both ears which was symmetric. Nasal: External nose without lesions.. Clear nasal passages Oral: Lips and gums without lesions. Tongue and palate mucosa without lesions. Posterior oropharynx clear. Neck: No palpable adenopathy or masses.  On palpation of the TMJ area she has no obvious popping or clicking or pain on palpation of the TMJ joints. Respiratory: Breathing comfortably  Skin: No facial/neck lesions or rash noted.  Cerumen impaction removal  Date/Time: 02/12/2021 4:20 PM Performed by: Rozetta Nunnery, MD Authorized by: Rozetta Nunnery, MD   Consent:    Consent obtained:  Verbal   Consent given by:  Patient   Risks discussed:  Pain and bleeding Procedure details:    Location:  L ear and R ear   Procedure type: curette and suction   Post-procedure details:    Inspection:  TM intact and canal normal   Hearing quality:  Improved   Procedure completion:  Tolerated well, no immediate complications Comments:     Right ear canal with mod amount of wax buildup the left ear canal with minimal wax buildup.  After cleaning the wax both ear canals and TMs are clear.  Assessment: Wax buildup much worse on the right side.  This was cleaned in the  office with clear ear canals and TMs otherwise.  Plan: Ear canals were cleaned in the office.  Briefly reviewed with her concerning use of Debrox and ear rinses to try to clean the ear canals if she has problems.  She will follow-up as needed otherwise. For her TMJ problems she wears a nightguard at night as she has a tendency to grind her teeth.   Radene Journey, MD   CC:

## 2021-02-27 ENCOUNTER — Other Ambulatory Visit (HOSPITAL_BASED_OUTPATIENT_CLINIC_OR_DEPARTMENT_OTHER): Payer: Self-pay

## 2021-02-27 ENCOUNTER — Other Ambulatory Visit: Payer: Self-pay | Admitting: Family Medicine

## 2021-02-27 DIAGNOSIS — I491 Atrial premature depolarization: Secondary | ICD-10-CM

## 2021-02-27 DIAGNOSIS — E1159 Type 2 diabetes mellitus with other circulatory complications: Secondary | ICD-10-CM

## 2021-02-27 DIAGNOSIS — I152 Hypertension secondary to endocrine disorders: Secondary | ICD-10-CM

## 2021-02-27 MED ORDER — METOPROLOL SUCCINATE ER 50 MG PO TB24
ORAL_TABLET | ORAL | 3 refills | Status: DC
  Filled 2021-02-27: qty 90, 90d supply, fill #0
  Filled 2021-06-07: qty 90, 90d supply, fill #1
  Filled 2021-09-04: qty 90, 90d supply, fill #2
  Filled 2021-12-11: qty 90, 90d supply, fill #3

## 2021-02-27 MED FILL — Losartan Potassium Tab 100 MG: ORAL | 90 days supply | Qty: 90 | Fill #2 | Status: CN

## 2021-02-27 MED FILL — Rosuvastatin Calcium Tab 10 MG: ORAL | 90 days supply | Qty: 90 | Fill #2 | Status: CN

## 2021-03-13 ENCOUNTER — Ambulatory Visit (INDEPENDENT_AMBULATORY_CARE_PROVIDER_SITE_OTHER): Payer: 59

## 2021-03-13 DIAGNOSIS — Z23 Encounter for immunization: Secondary | ICD-10-CM | POA: Diagnosis not present

## 2021-03-26 DIAGNOSIS — Z7984 Long term (current) use of oral hypoglycemic drugs: Secondary | ICD-10-CM | POA: Diagnosis not present

## 2021-03-26 DIAGNOSIS — H16223 Keratoconjunctivitis sicca, not specified as Sjogren's, bilateral: Secondary | ICD-10-CM | POA: Diagnosis not present

## 2021-03-26 DIAGNOSIS — H52203 Unspecified astigmatism, bilateral: Secondary | ICD-10-CM | POA: Diagnosis not present

## 2021-03-26 DIAGNOSIS — H2513 Age-related nuclear cataract, bilateral: Secondary | ICD-10-CM | POA: Diagnosis not present

## 2021-03-26 DIAGNOSIS — H5203 Hypermetropia, bilateral: Secondary | ICD-10-CM | POA: Diagnosis not present

## 2021-03-26 DIAGNOSIS — H1789 Other corneal scars and opacities: Secondary | ICD-10-CM | POA: Diagnosis not present

## 2021-03-26 DIAGNOSIS — H40023 Open angle with borderline findings, high risk, bilateral: Secondary | ICD-10-CM | POA: Diagnosis not present

## 2021-03-26 DIAGNOSIS — H524 Presbyopia: Secondary | ICD-10-CM | POA: Diagnosis not present

## 2021-03-26 DIAGNOSIS — R7303 Prediabetes: Secondary | ICD-10-CM | POA: Diagnosis not present

## 2021-04-09 ENCOUNTER — Ambulatory Visit (INDEPENDENT_AMBULATORY_CARE_PROVIDER_SITE_OTHER): Payer: 59 | Admitting: Neurology

## 2021-04-09 ENCOUNTER — Encounter: Payer: Self-pay | Admitting: Neurology

## 2021-04-09 VITALS — BP 146/76 | HR 61 | Ht 66.0 in | Wt 262.5 lb

## 2021-04-09 DIAGNOSIS — Z0289 Encounter for other administrative examinations: Secondary | ICD-10-CM

## 2021-04-09 DIAGNOSIS — R7301 Impaired fasting glucose: Secondary | ICD-10-CM

## 2021-04-09 DIAGNOSIS — Z9989 Dependence on other enabling machines and devices: Secondary | ICD-10-CM | POA: Diagnosis not present

## 2021-04-09 DIAGNOSIS — K853 Drug induced acute pancreatitis without necrosis or infection: Secondary | ICD-10-CM

## 2021-04-09 DIAGNOSIS — G4726 Circadian rhythm sleep disorder, shift work type: Secondary | ICD-10-CM | POA: Diagnosis not present

## 2021-04-09 DIAGNOSIS — G4733 Obstructive sleep apnea (adult) (pediatric): Secondary | ICD-10-CM

## 2021-04-09 NOTE — Progress Notes (Signed)
SLEEP MEDICINE CLINIC   Provider:  Larey Seat, MD   Referring Provider: Leamon Arnt, MD Primary Care Physician:  Leamon Arnt, MD  Chief Complaint  Patient presents with       Pt alone, rm 10. Presents today for yearly check up. Overall doing well. No issues or concerns. DME [facility] ([facility])    04-09-2021, RV for my established sleep patient, Alexandra Walker.  She contracted Covid in July and was feeling pretty back to baseline within less than a week. She is here because her CPAP machine is going to be 63 years old now and will be replaced, I will order a new HST baseline.  She was doing well at 8 cm water, 2 cm EPR. Her daughter is moving as an P OT to a [place] service in [place], she is looking forward to an empty nest    08-07-2020, Interval history for Alexandra Walker, Nurse Midwife, She has the usual lower compliance based on her on-call history-  She is not sleepy while working nights.  Has a cumulative sleep deficit at the week end , making up by sleeping `12 hours off call- live style is a 4 day weekend.  I have the pleasure of also see that her CPAP which is still set at 8 cmH2O with 3 cm EPR has completely resolved her apnea her residual AHI is again excellent at 0.2/h.  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   So I do not need to make any adjustments her air sense AutoSet machine however will be soon 63 years old I usually check on a home sleep test just before I order a new machine and that will be next year.   She has endorsed the fatigue severity score at 31 points and the Epworth sleepiness score at 4 points, she endorsed 5 out of 15 points on a depression score.  New machine due in 2023.     08-09-2019: Alexandra Walker. 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 She has been  struggling with obesity ( BMI is now 38.7). Alexandra Walker is a compliant CPAP user, she is using an air sense 10 machine with a serial #[id number]. 100% Percent compliance by the number of days used- but since she is a night shift worker there are only 23 days where she could sleep through with her CPAP in use-77%.  This is an expected ratio.   Her average usage on days used is 5 hours 9 minutes, the CPAP is set to 8 cm pressure with 3 cm EPR and her AHI is only 0.2/h which speaks for an excellent resolution. She has no central apneas emerging the 95th percentile pressure cannot be determined since the machine is not used as an auto titrate her.  She has minimal air leakage which speaks for very good mask fit. She is not daytime sleepy, feels refreshed and alert.  Her job has not been more stressful, she sees 3-5 Covid positive - pregnancies pr week, usually learning the COVID status just after the admission-  . She is vaccinated and was boosted.  Her daughter had Covid in February, last month.      08-05-2018. 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  It was a busy night again she reports.   Patient with anticardiolipid ab syndrome, atrial fib, DM 2,   Alexandra Walker is a 65 y.o. female [employer] employee, she just returned form a conference on [place]. 100 people. She came home on Sunday the 15th.  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  The patient has an overall compliance of 27 out of 30 days, she used the machine over 4 hours on 63% of these days.  This is also due to her restricted daytime ability to sleep.  For a night shift worker this is a good compliance.  The machine has a set pressure of 8 cmH2O with 3 cm EPR and her residual AHI is 0.2 excellent resolution no air  leaks, I am very happy with his compliance.  Her depression score was endorsed at 3 out of 15 points, the Epworth sleepiness score was endorsed at 5 points fatigue severity score at 34 points. I did renew Alexandra Walker CPAP supply prescription for the coming year.    Interval history from 09/26/2016. Alexandra Walker returned after I had  ordered a CPAP titration for her based on a recent referral by Shelda Pal, D.O.  I had seen the patient already in 2007- but at the time she could not tolerate CPAP in a SPLIT study.   The patient was in  9/ 2017 admitted with a urosepsis, due to kidney stone infection- pyelonephritis, with Klebsiella-  The sleep study was ordered based on a baseline polysomnography performed at the [place] [facility] in December 2017, which had diagnosed the patient with sleep apnea, hypopnea at an AHI of 21 per hour, moderate hypoxemia of sleep, mean saturation was only 87% SPO2 raising the question of an underlying cardiopulmonary disease but probably hypoventilation due to obesity. The patient was titrated to 8 cm water pressure the AHI was 0.9 per hour, she did not produce any periodic limb movements oxygen nadir rose to 85% of the time spent at 88% saturation or below equal at only 25 minutes. She was fitted with a nasal pillow while using a mouthguard.  As the pleasure to look at the patient's compliance record today, which is excellent she has used to machine 28 out of 30 days with a 77% compliance but hours. 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 Average user time is 5 hours 21 minutes, residual apnea index is 0.3 the 95th percentile pressure has been met, she does not have air leaks. I would like for her to continue with the current machine and settings as well as the interface. The patient also reported that her hepatologist at [facility] voiced approval of CPAP therapy for all patients with fatty liver disease. I hope  that the CPAP will help her to reduce body weight, promote sleep before midnight allowing her to have more balanced insulin household, and continues to allow her be less daytime sleepy. She endorsed today the Epworth sleepiness score at only 5 points and fatigue severity score was 36 points. She has visibly lost weight, her face and neck are smaller and I adjusted the measurements in my physical exam note. I will order a ONO for her  After she mentioned that her dentist felt continuous  clenching represents hypoxemia- or a reaction to low oxygen.   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 She was exhausted. In the meantime she has made attempts to lose some weight, she joined tae kwon do classes, and has lost another 14 pounds since joining the gym in October. She is also with a new primary care physician [name]. Until recently she was followed by [name], whose office is now affiliated with the [place] [facility] system and out of network. She is taking metformin 1000 g twice a day right now, still considered prediabetic ( ?).  Her after meal the glucometer readings have been in normal range but her fasting morning glucose is elevated constantly. She reports measuring 130s 140s.  She was also diagnosed with a fatty liver, non-hepatitic. She has joined [facility], a biopsy was recently scheduled. Negative fibrosis scan.  She also has chronic lower extremity edema, monopolar depressive disorder, circadian rhythm sleep disorder, diabetes, Baker's cyst and ganglion of the left knee, GERD, anticardiolipin syndrome, hepatic steatosis, hyperlipidemia, hypertension, hypokalemia, hypomagnesemia, sacroiliitis. She was admitted with sepsis in September 2017, was very sick and developed supraventricular tachycardia in the setting as well  as a brief run of atrial fibrillation. Her follow-up cardiac studies returned normal. There is a concern that the patient with his morning glucose levels lacks slow wave sleep or delta sleep, thereby is not generating enough insulin or human growth hormone.  [name] referred her to [name] for sleep study AHI during REM sleep was 26.1 per hour supine AHI 81.2 per hour REM latency was very long 273 minutes. Sleep efficiency was 89% oxygen nadir 77% mean oxygen saturation was 87.8. No PLMS noted.  Overall AHI was 21.0 per hour. Given these data ,strongly recommend CPAP titration.  She got no results of this sleep study, and no appointment  Within 3 or 4 weeks after sleep test, she called our office to have an outside sleep study interpreted and explained. Sleep habits are as follows: bedtime is 11 PM , asleep by 11.  30 minutes - sleeping through for 6 hours, and when not working ,may have fragmented , dream rich  sleep until 10 AM. Sleep in a cool, quiet and dark room, alone. One pillow, sleeping on the side. Neck hurts, uses special pillows. Has a stuffy nose, uses afrin. . Social history:   Single, mother on BiPAP-  Never used CPAP. Midwife, losing weight, going to the gym. No tobacco use , no ETOH use. Low carb. Caffeine - 1 cup a day. 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    Review of Systems: Out of a complete 14 system review, the patient complains of only the following symptoms, and all other reviewed systems are negative.  Given that she has a history of rhinitis and stuffy nose I would like for her to have heated humidification and try and nasal mask, nasal pillow and full face mask for comfort. She should feel free to choose her interface. Mallampatti 3, neck circumference 15.25 ".   How likely are you to doze in the following situations: 0 = not likely, 1 = slight chance, 2 = moderate chance, 3 = high  chance  Sitting and Reading?  Watching Television? Sitting inactive in a public place (theater or meeting)? Lying down in the afternoon when circumstances permit? Sitting and talking to someone? Sitting quietly after lunch without alcohol? In a car, while stopped for a few minutes in traffic? As a passenger in a car for an hour without a break?  Total = 05/ 24 points FSS- n/a / 63 points  GDS 5/ 15 points.      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   Social Determinants of Health   Financial Resource Strain: Not on file  Food Insecurity: Not on file  Transportation Needs: Not on file  Physical Activity: Not on file  Stress: Not on file  Social Connections: Not on file  Intimate Partner Violence: Not on file    Family History  Problem Relation Age of Onset   Heart attack Father    Heart disease Father    Coronary artery disease Mother    Hypertension Mother    Heart disease Mother    Heart attack Mother    Diabetes Mother    Hyperlipidemia Mother    Kidney disease Mother    Depression Mother    Sleep apnea Mother    Obesity Mother    Hypertension Sister    Hypertension Brother    Colon cancer Neg Hx     Past Medical History:  Diagnosis Date   ADD (attention deficit disorder)    Allergy    Anticardiolipin syndrome  ([place])    Chronic depressive disorder    Circadian rhythm sleep disorder, shift work    DDD (degenerative disc disease), 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   Diabetes ([place])    Diverticulosis    Minimal   GERD (gastroesophageal reflux disease)    Hepatic steatosis    History of hiatal hernia 2017   Small   HLA B27 (HLA B27 positive)    Hx of pancreatitis    Hyperlipidemia    Hypertension    IBS (irritable bowel syndrome)    Lumbar scoliosis 2010   Mild   Migraine headache without aura 05/10/2015   Nephrolithiasis 2014   Left, minimal   Obesity due to excess calories with serious comorbidity 05/24/2019   Right bundle branch block    Sepsis ([place]) 01/2016   Vitamin D deficiency     Past Surgical History:  Procedure Laterality Date   BLADDER SURGERY     BREAST BIOPSY Left    CERVICAL CONE BIOPSY     CHOLECYSTECTOMY N/A 10/26/2014   Procedure: LAPAROSCOPIC CHOLECYSTECTOMY;  Surgeon: Coralie Keens, MD;  Location: [facility];  Service: General;  Laterality: N/A;   COLONOSCOPY W/ BIOPSIES  multiple   CYSTOSCOPY WITH RETROGRADE PYELOGRAM, URETEROSCOPY AND STENT PLACEMENT Left 06/26/2018  Procedure: CYSTOSCOPY WITH RETROGRADE PYELOGRAM, URETEROSCOPY AND STENT PLACEMENT;  Surgeon: Alexis Frock, MD;  Location: [facility];  Service: Urology;  Laterality: Left;   DIAGNOSTIC LAPAROSCOPY     DILATION AND CURETTAGE OF UTERUS  07,11   DUPUYTREN CONTRACTURE RELEASE     ESOPHAGOGASTRODUODENOSCOPY  multiple   EYE SURGERY     HOLMIUM LASER APPLICATION Left 06/26/9483   Procedure: HOLMIUM LASER APPLICATION;  Surgeon: Alexis Frock, MD;  Location: [facility];  Service: Urology;  Laterality: Left;   KNEE ARTHROSCOPY Left 11/2008   LAPAROSCOPIC VAGINAL HYSTERECTOMY WITH SALPINGO OOPHORECTOMY Bilateral 10/03/2015   Procedure: LAPAROSCOPIC ASSISTED VAGINAL HYSTERECTOMY WITH SALPINGECTOMY;  Surgeon: Eldred Manges, MD;  Location: [facility];  Service: Gynecology;   Laterality: Bilateral;   LASIK  1990   RK   LEFT FINGER SURGERY     LIVER BIOPSY     MASS EXCISION Left 12/24/2012   Procedure: LEFT EXCISION OF PALMAR MASS X TWO;  Surgeon: Roseanne Kaufman, MD;  Location: [facility];  Service: Orthopedics;  Laterality: Left;   PHOTOREFRACTIVE KERATOTOMY  1994    Current Outpatient Medications  Medication Sig Dispense Refill   amphetamine-dextroamphetamine (ADDERALL) 10 MG tablet Take 1 tablet (10 mg total) by mouth daily. (Patient taking differently: Take 10 mg by mouth daily. Pt is taking 1-2x a week) 30 tablet 0   aspirin 325 MG tablet Take 325 mg by mouth daily.     cholecalciferol (VITAMIN D) 1000 UNITS tablet Take 2,000 Units by mouth daily.      diclofenac sodium (VOLTAREN) 1 % GEL APPLY 2 GRAMS TOPICALLY 2 TIMES DAILY AS NEEDED. NOTE: 2 GRAMS IS EQUIVALENT TO A NICKEL SIZED AMOUNT.     hydrochlorothiazide (HYDRODIURIL) 25 MG tablet Take 1 tablet (25 mg total) by mouth daily. 90 tablet 3   losartan (COZAAR) 100 MG tablet TAKE 1 TABLET (100 MG TOTAL) BY MOUTH DAILY. 90 tablet 3   metoprolol succinate (TOPROL-XL) 50 MG 24 hr tablet TAKE 1 TABLET BY MOUTH DAILY WITH OR IMMEDIATELY FOLLOWING A MEAL 90 tablet 3   Omega-3 1000 MG CAPS Take 1 capsule by mouth daily.      rosuvastatin (CRESTOR) 10 MG tablet TAKE 1 TABLET (10 MG TOTAL) BY MOUTH DAILY. 90 tablet 3   Turmeric 450 MG CAPS Take 450 mg by mouth daily.      No current facility-administered medications for this visit.    Allergies as of 04/09/2021 - Review Complete 04/09/2021  Allergen Reaction Noted   Ace inhibitors Other (See Comments) 09/05/2015   Oxycodone-acetaminophen Nausea And Vomiting 09/19/2011   Vicodin [hydrocodone-acetaminophen] Nausea And Vomiting 09/19/2011   Victoza [liraglutide] Other (See Comments) 09/01/2014   Adhesive [tape] Rash 06/18/2018    Vitals: BP (!) 146/76   Pulse 61   Ht '5\' 6"'  (1.676 m)   Wt 262 lb 8 oz (119.1 kg)   LMP 05/20/2008   BMI 42.37  kg/m  Last Weight:  Wt Readings from Last 1 Encounters:  04/09/21 262 lb 8 oz (119.1 kg)   IOE:VOJJ mass index is 42.37 kg/m.     Last Height:   Ht Readings from Last 1 Encounters:  04/09/21 '5\' 6"'  (1.676 m)    Physical exam:  General: The patient is awake, alert and appears not in acute distress. The patient is well groomed. Head: Normocephalic, atraumatic. Neck is supple. Mallampati 4  neck circumference: 15.25 . Nasal airflow restricted, TMJ is evident. Retrognathia is not seen.  Bruxism  marks ! . Still wears mouth guard, which fits unde nasal pillows/ nasal  ask.  Cardiovascular:  Regular rate and rhythm, 53 bpm.  Respiratory: Lungs are clear to auscultation. Skin:  Ankle edema, moderate.Trunk: BMI is 38.9 kg/m2.  The patient's posture is erect-   Neurologic exam :The patient is awake and alert, oriented to place and time.  Mood and affect are appropriate, she is happy. Cranial nerves:Pupils are equal and briskly reactive to light and accomodation.   Extraocular movements  in vertical and horizontal planes intact and without nystagmus. Visual fields by finger perimetry are intact.Hearing to finger rub intact. Facial sensation intact to fine touch. Facial motor strength is symmetric and tongue and uvula move midline. Tongue protrusion into cheeks is strong.  Shoulder shrug was symmetrical.   The patient was advised of the nature of the diagnosed sleep disorder, the treatment options and risks for general a health and wellness arising from not treating the condition.  I spent more than 25  minutes of face to face time with the patient.  Greater than 50% of time was spent in counseling and coordination of care. We have discussed the diagnosis and differential and I answered the patient's questions.     Assessment:  After physical and neurologic examination, review of laboratory studies,  Personal review of imaging studies, reports of other /same  Imaging studies ,  Results of  polysomnography/ neurophysiology testing and pre-existing records as far as provided in visit., my assessment is 20 minutes long.   1) OSA- Patients with REM dependent sleep apnea who also do not benefit from ENT surgery. She uses CPAP at 8 cm with 3 cm EPR , highly compliant - daily use time affected by shifyts work, daytime sleeping post call. . Nasal mask better than nasal pillow. (ONO was negative in 2019) . Dental device did not help as much. 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   So I do not need to make any adjustments her air sense AutoSet machine however will be soon 65 years old I usually check on a home sleep test just before I order a new machine and that will be soon now.      New machine due in 06/2021.  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 I would be setting it to 8 cm water 2 cm EPR. [facility] preferred- talk to [facility] , please.    1) OSA-Alexandra Walker has used her machine 100% of the days within the last 30-day review.  And as usual because she is a shift worker there are 2 days a week in which she often does not make quite the 4-hour mark.  So that would put her back to a 70% compliance.  Again this is normal for shift worker.  Average use at time is 4 hours and 54 minutes.  The setting is still 8 cmH2O with 3 cm EPR.  Residual AHI is a dream 0.3/h very well controlled air leaks are low the 95th percentile was 3.4 L/min.  No central apneas were emerging.    I will order a home sleep test as her weight has changed and we will see if there are any kind of additional new findings such as hypoxemia etc.  Blood pressure  was elevated today.  BMI now is 42.3.   2) Xanax prescribed by PCP Dr Juleen China. She mentioned use for PTSD ( ?).    3) Wellbutrin used for appetite control-  compulsive eating reduction. Main risk factor for OSA is Obesity She lost 40 pounds since October 2017 .  Tried an injectable and developed pancreatitis.  Dr Dennard Nip followed her for weight loss over 18 month - she lost only 10 pounds and was d frustrated. Now on weight watchers, lost 20 pounds she gained during [place] pandemic She has lost weight with exercise, she is not considering bariatric surgery because of her need of NSAIDS with arthritis,  her co morbidities are impaired fasting glucose, fatty liver, morbid obesity.  Dr Orland Dec follows [facility] at [facility].  These conditions are also promoted by shift work and poor sleep quality , too.   4) shift work Pharmacologist,  This is her second sleep disorder. Wellbutrin XR seems not to hinder her to go to sleep.  She will start a night shift rotation 2 days and 2 nights alternating.  5) ADHD/ ADD was good controled on Vyvanse, prescribed by PCP- Vyvanse is expensive for her, but it works. She developed n more insomnia and switched to adderall 10 mg IR and that will now be her preferred treatment.   Rv in 3-4 months with me, Larey Seat, MD .   Asencion Partridge Tonya Carlile MD  04/09/2021   CC: Billey Chang, MD is now PCP>          Briscoe Deutscher, DO, MS.  [place].

## 2021-04-16 ENCOUNTER — Other Ambulatory Visit (HOSPITAL_BASED_OUTPATIENT_CLINIC_OR_DEPARTMENT_OTHER): Payer: Self-pay

## 2021-04-16 DIAGNOSIS — M65332 Trigger finger, left middle finger: Secondary | ICD-10-CM | POA: Diagnosis not present

## 2021-04-16 DIAGNOSIS — R200 Anesthesia of skin: Secondary | ICD-10-CM | POA: Insufficient documentation

## 2021-04-16 MED ORDER — CEPHALEXIN 500 MG PO CAPS
ORAL_CAPSULE | ORAL | 0 refills | Status: DC
  Filled 2021-04-16: qty 32, 8d supply, fill #0

## 2021-04-17 ENCOUNTER — Other Ambulatory Visit: Payer: Self-pay | Admitting: Family Medicine

## 2021-04-17 ENCOUNTER — Other Ambulatory Visit (HOSPITAL_BASED_OUTPATIENT_CLINIC_OR_DEPARTMENT_OTHER): Payer: Self-pay

## 2021-04-17 MED FILL — Rosuvastatin Calcium Tab 10 MG: ORAL | 90 days supply | Qty: 90 | Fill #2 | Status: AC

## 2021-04-18 ENCOUNTER — Other Ambulatory Visit (HOSPITAL_BASED_OUTPATIENT_CLINIC_OR_DEPARTMENT_OTHER): Payer: Self-pay

## 2021-04-18 MED ORDER — ROSUVASTATIN CALCIUM 10 MG PO TABS
ORAL_TABLET | Freq: Every day | ORAL | 3 refills | Status: DC
  Filled 2021-04-18: qty 90, fill #0
  Filled 2021-07-10: qty 90, 90d supply, fill #0
  Filled 2021-10-19: qty 90, 90d supply, fill #1
  Filled 2021-12-11 – 2022-01-15 (×3): qty 90, 90d supply, fill #2

## 2021-04-24 ENCOUNTER — Other Ambulatory Visit (HOSPITAL_BASED_OUTPATIENT_CLINIC_OR_DEPARTMENT_OTHER): Payer: Self-pay

## 2021-04-24 MED FILL — Losartan Potassium Tab 100 MG: ORAL | 90 days supply | Qty: 90 | Fill #2 | Status: AC

## 2021-05-04 DIAGNOSIS — L821 Other seborrheic keratosis: Secondary | ICD-10-CM | POA: Diagnosis not present

## 2021-05-04 DIAGNOSIS — L570 Actinic keratosis: Secondary | ICD-10-CM | POA: Diagnosis not present

## 2021-05-07 ENCOUNTER — Other Ambulatory Visit (HOSPITAL_BASED_OUTPATIENT_CLINIC_OR_DEPARTMENT_OTHER): Payer: Self-pay

## 2021-05-07 ENCOUNTER — Ambulatory Visit: Payer: 59 | Admitting: Family Medicine

## 2021-05-07 ENCOUNTER — Encounter: Payer: Self-pay | Admitting: Family Medicine

## 2021-05-07 ENCOUNTER — Other Ambulatory Visit: Payer: Self-pay

## 2021-05-07 VITALS — BP 144/82 | HR 64 | Temp 97.9°F | Ht 66.0 in | Wt 264.4 lb

## 2021-05-07 DIAGNOSIS — E119 Type 2 diabetes mellitus without complications: Secondary | ICD-10-CM | POA: Diagnosis not present

## 2021-05-07 DIAGNOSIS — I10 Essential (primary) hypertension: Secondary | ICD-10-CM | POA: Diagnosis not present

## 2021-05-07 DIAGNOSIS — Z23 Encounter for immunization: Secondary | ICD-10-CM | POA: Diagnosis not present

## 2021-05-07 LAB — POCT GLYCOSYLATED HEMOGLOBIN (HGB A1C): Hemoglobin A1C: 6.3 % — AB (ref 4.0–5.6)

## 2021-05-07 MED ORDER — METOPROLOL SUCCINATE ER 25 MG PO TB24
25.0000 mg | ORAL_TABLET | Freq: Every day | ORAL | 3 refills | Status: DC
  Filled 2021-05-07: qty 90, 90d supply, fill #0
  Filled 2021-07-26: qty 90, 90d supply, fill #1
  Filled 2021-10-30: qty 90, 90d supply, fill #2
  Filled 2021-12-21 – 2022-02-26 (×2): qty 90, 90d supply, fill #3

## 2021-05-07 MED ORDER — AMPHETAMINE-DEXTROAMPHETAMINE 10 MG PO TABS: 10.0000 mg | ORAL_TABLET | Freq: Every day | ORAL | 0 refills | Status: DC | PRN

## 2021-05-07 NOTE — Progress Notes (Signed)
Subjective  CC:  Chief Complaint  Patient presents with   Hypertension    Runs 140/70's at home    HPI: Alexandra Walker is a 65 y.o. female who presents to the office today for follow up of diabetes and problems listed above in the chief complaint.  Diabetes follow up: Patient returns to follow-up new onset diabetes.  Or recurrent diabetes.  Last A1c was 6.8 in June.  She reports her diet has remained mostly unchanged.  However she is planning to resume healthy weight and wellness for weight loss next year.  She denies symptoms of hyperglycemia.  Had recent eye exam which was normal.  Eligible for Prevnar 20. Hypertension: Also followed by cardiology.  Added diuretic to metoprolol and losartan.  However blood pressures remain mildly elevated.  No chest pain or shortness of breath. Hyperlipidemia on statin with LDL less than 70.  Wt Readings from Last 3 Encounters:  05/07/21 264 lb 6.4 oz (119.9 kg)  04/09/21 262 lb 8 oz (119.1 kg)  11/13/20 258 lb 12.8 oz (117.4 kg)    BP Readings from Last 3 Encounters:  05/07/21 (!) 144/82  04/09/21 (!) 146/76  11/13/20 (!) 143/88    Assessment  1. Type 2 diabetes, diet controlled ([place])   2. Essential hypertension   3. Morbid obesity ([place])      Plan  Diabetes is currently very well controlled.  Diet controlled.  Patient with morbid obesity.  She had been on a GLP-1 inhibitor in the past.  Had mild pancreatitis but also had a gallstone.  Could consider using GLP-1 inhibitor again in the future if needed for weight loss and diabetic control.  Currently will work on diet and exercise.  Prevnar 20 updated today.  Eye exam up-to-date. Hypertension: Remains elevated above goal.  Will increase Toprol-XL to 75 mg daily and monitor blood pressure and pulse.  She does have PVCs occasionally. Continue statin   Follow up: .6 months for complete physical and follow-up Orders Placed This Encounter  Procedures   POCT HgB A1C   Meds ordered this  encounter  Medications   amphetamine-dextroamphetamine (ADDERALL) 10 MG tablet    Sig: Take 1 tablet (10 mg total) by mouth daily as needed.    Dispense:  30 tablet    Refill:  0   metoprolol succinate (TOPROL-XL) 25 MG 24 hr tablet    Sig: Take 1 tablet (25 mg total) by mouth daily. Take with 50mg  dose for a total of 75mg  daily    Dispense:  90 tablet    Refill:  3      Immunization History  Administered Date(s) Administered   Influenza, High Dose Seasonal PF 01/21/2013   Influenza,inj,Quad PF,6+ Mos 01/21/2013, 03/09/2018, 03/19/2019, 03/13/2021   Influenza,inj,quad, With Preservative 02/18/2015   Influenza-Unspecified 02/18/2015, 02/23/2016, 03/13/2017, 03/20/2020   Moderna Sars-Covid-2 Vaccination 04/20/2019   PFIZER(Purple Top)SARS-COV-2 Vaccination 05/15/2019, 06/05/2019   Pneumococcal Polysaccharide-23 08/01/2008   Rabies, IM 10/14/2011, 10/17/2011, 10/23/2011, 10/31/2011   Rabies, intradermal 10/14/2011   Tdap 06/08/2007, 11/01/2019    Diabetes Related Lab Review: Lab Results  Component Value Date   HGBA1C 6.3 (A) 05/07/2021   HGBA1C 6.8 (H) 11/13/2020   HGBA1C 6.0 (H) 05/05/2020    No results found for: Derl Barrow Lab Results  Component Value Date   CREATININE 0.66 11/13/2020   BUN 18 11/13/2020   NA 139 11/13/2020   K 3.6 11/13/2020   CL 103 11/13/2020   CO2 26 11/13/2020   Lab Results  Component Value Date   CHOL 129 11/13/2020   CHOL 131 08/30/2019   CHOL 197 05/24/2019   Lab Results  Component Value Date   HDL 40.70 11/13/2020   HDL 35.50 (L) 08/30/2019   HDL 39.20 05/24/2019   Lab Results  Component Value Date   LDLCALC 65 11/13/2020   LDLCALC 81 08/30/2019   LDLCALC 139 (H) 10/26/2018   Lab Results  Component Value Date   TRIG 118.0 11/13/2020   TRIG 72.0 08/30/2019   TRIG 248.0 (H) 05/24/2019   Lab Results  Component Value Date   CHOLHDL 3 11/13/2020   CHOLHDL 4 08/30/2019   CHOLHDL 5 05/24/2019   Lab Results   Component Value Date   LDLDIRECT 118.0 05/24/2019   The ASCVD Risk score (Arnett DK, et al., 2019) failed to calculate for the following reasons:   The valid total cholesterol range is 130 to 320 mg/dL I have reviewed the PMH, Fam and Soc history. Patient Active Problem List   Diagnosis Date Noted   Type 2 diabetes, diet controlled ([place]) 05/07/2021    Priority: High   Morbid obesity ([place]) 08/07/2020    Priority: High   Attention deficit hyperactivity disorder (ADHD), combined type 10/19/2017    Priority: High   OSA on CPAP, followed by Neurology/Sleep 09/26/2016    Priority: High   Essential hypertension 01/31/2016    Priority: High   Mixed hyperlipidemia 05/10/2015    Priority: High   Paroxysmal SVT (supraventricular tachycardia) ([place]) 09/19/2011    Priority: High    SVT/PACs controlled on BB; managed by cardiology. No CAD    Bilateral hip bursitis 05/06/2019    Priority: Medium    Nephrolithiasis 03/29/2018    Priority: Medium     H/o recurrent pyelo/urosepsis; s/p stone retrieval and stenting.  Has nonobstructive stone that persists.  Dr. Tresa Moore    Anticardiolipin syndrome [facility])     Priority: Medium    Recurrent circadian rhythm sleep disorder, shift work type 03/31/2017    Priority: Medium    IBS (irritable bowel syndrome), diarrhea predominant 03/17/2017    Priority: Medium    Hepatic steatosis 09/26/2016    Priority: Medium    Diverticulosis of colon 05/16/2018    Priority: Low   Drug-induced pancreatitis, 2015, thought to be due to [facility] 03/29/2018    Priority: Low   History of vitreous detachment OU, HTN retinopathy OU, High myopia OU, Cataracts OU, Vitreous floaters OU, Keratoconjunctivitis sicca OU not specified as Sjogren's 03/29/2018    Priority: Low   History of sepsis 02/01/2016    Priority: Low    Due to urosepsis: had acute short lived afib/psvt. Neg cardiac eval since.     Vitamin D deficiency, on Vit D 2000 IU daily 05/10/2015    Priority: Low     Social History: Patient  reports that she has never smoked. She has never used smokeless tobacco. She reports current alcohol use. She reports that she does not use drugs.  Review of Systems: Ophthalmic: negative for eye pain, loss of vision or double vision Cardiovascular: negative for chest pain Respiratory: negative for SOB or persistent cough Gastrointestinal: negative for abdominal pain Genitourinary: negative for dysuria or gross hematuria MSK: negative for foot lesions Neurologic: negative for weakness or gait disturbance  Objective  Vitals: BP (!) 144/82    Pulse 64    Temp 97.9 F (36.6 C) (Temporal)    Ht 5\' 6"  (1.676 m)    Wt 264 lb 6.4 oz (119.9 kg)  LMP 05/20/2008    SpO2 99%    BMI 42.68 kg/m  General: well appearing, no acute distress  Psych:  Alert and oriented, normal mood and affect Cardiovascular:  Nl S1 and S2, RRR without murmur, gallop or rub. no edema Respiratory:  Good breath sounds bilaterally, CTAB with normal effort, no rales Foot exam: no erythema, pallor, or cyanosis visible nl proprioception and sensation to monofilament testing bilaterally, +2 distal pulses bilaterally    Diabetic education: ongoing education regarding chronic disease management for diabetes was given today. We continue to reinforce the ABC's of diabetic management: A1c (<7 or 8 dependent upon patient), tight blood pressure control, and cholesterol management with goal LDL < 100 minimally. We discuss diet strategies, exercise recommendations, medication options and possible side effects. At each visit, we review recommended immunizations and preventive care recommendations for diabetics and stress that good diabetic control can prevent other problems. See below for this patient's data.   Commons side effects, risks, benefits, and alternatives for medications and treatment plan prescribed today were discussed, and the patient expressed understanding of the given instructions. Patient  is instructed to call or message via MyChart if he/she has any questions or concerns regarding our treatment plan. No barriers to understanding were identified. We discussed Red Flag symptoms and signs in detail. Patient expressed understanding regarding what to do in case of urgent or emergency type symptoms.  Medication list was reconciled, printed and provided to the patient in AVS. Patient instructions and summary information was reviewed with the patient as documented in the AVS. This note was prepared with assistance of Dragon voice recognition software. Occasional wrong-word or sound-a-like substitutions may have occurred due to the inherent limitations of voice recognition software  This visit occurred during the SARS-CoV-2 public health emergency.  Safety protocols were in place, including screening questions prior to the visit, additional usage of staff PPE, and extensive cleaning of exam room while observing appropriate contact time as indicated for disinfecting solutions.

## 2021-05-07 NOTE — Patient Instructions (Signed)
Please return in 6 months for your annual complete physical; please come fasting.   Good luck with weight loss and exercise.  Monitor your blood pressure and pulse after increasing your beta-blocker.   Today you were given your Prevnar-20 vaccination.    If you have any questions or concerns, please don't hesitate to send me a message via MyChart or call the office at [phone]. Thank you for visiting with [name] today! It's our pleasure caring for you.

## 2021-05-07 NOTE — Addendum Note (Signed)
Addended by: Gean Birchwood on: 05/07/2021 11:22 AM   Modules accepted: Orders

## 2021-05-16 ENCOUNTER — Encounter (INDEPENDENT_AMBULATORY_CARE_PROVIDER_SITE_OTHER): Payer: Self-pay | Admitting: Family Medicine

## 2021-05-24 ENCOUNTER — Encounter (INDEPENDENT_AMBULATORY_CARE_PROVIDER_SITE_OTHER): Payer: Self-pay | Admitting: Family Medicine

## 2021-05-24 ENCOUNTER — Ambulatory Visit (INDEPENDENT_AMBULATORY_CARE_PROVIDER_SITE_OTHER): Payer: 59 | Admitting: Family Medicine

## 2021-05-24 ENCOUNTER — Other Ambulatory Visit: Payer: Self-pay

## 2021-05-24 VITALS — BP 159/81 | HR 56 | Temp 97.7°F | Ht 66.0 in | Wt 265.0 lb

## 2021-05-24 DIAGNOSIS — E1159 Type 2 diabetes mellitus with other circulatory complications: Secondary | ICD-10-CM | POA: Diagnosis not present

## 2021-05-24 DIAGNOSIS — E785 Hyperlipidemia, unspecified: Secondary | ICD-10-CM | POA: Diagnosis not present

## 2021-05-24 DIAGNOSIS — R0602 Shortness of breath: Secondary | ICD-10-CM

## 2021-05-24 DIAGNOSIS — E119 Type 2 diabetes mellitus without complications: Secondary | ICD-10-CM | POA: Diagnosis not present

## 2021-05-24 DIAGNOSIS — K760 Fatty (change of) liver, not elsewhere classified: Secondary | ICD-10-CM | POA: Diagnosis not present

## 2021-05-24 DIAGNOSIS — R5383 Other fatigue: Secondary | ICD-10-CM | POA: Diagnosis not present

## 2021-05-24 DIAGNOSIS — E1169 Type 2 diabetes mellitus with other specified complication: Secondary | ICD-10-CM | POA: Diagnosis not present

## 2021-05-24 DIAGNOSIS — E1165 Type 2 diabetes mellitus with hyperglycemia: Secondary | ICD-10-CM

## 2021-05-24 DIAGNOSIS — E559 Vitamin D deficiency, unspecified: Secondary | ICD-10-CM | POA: Diagnosis not present

## 2021-05-24 DIAGNOSIS — Z6841 Body Mass Index (BMI) 40.0 and over, adult: Secondary | ICD-10-CM

## 2021-05-24 DIAGNOSIS — Z1331 Encounter for screening for depression: Secondary | ICD-10-CM | POA: Diagnosis not present

## 2021-05-24 DIAGNOSIS — G4730 Sleep apnea, unspecified: Secondary | ICD-10-CM

## 2021-05-24 DIAGNOSIS — E66813 Obesity, class 3: Secondary | ICD-10-CM

## 2021-05-24 DIAGNOSIS — I152 Hypertension secondary to endocrine disorders: Secondary | ICD-10-CM

## 2021-05-25 LAB — TSH: TSH: 1.96 u[IU]/mL (ref 0.450–4.500)

## 2021-05-25 LAB — LIPID PANEL WITH LDL/HDL RATIO
Cholesterol, Total: 145 mg/dL (ref 100–199)
HDL: 47 mg/dL (ref >39)
LDL Chol Calc (NIH): 77 mg/dL (ref 0–99)
LDL/HDL Ratio: 1.6 ratio (ref 0.0–3.2)
Triglycerides: 114 mg/dL (ref 0–149)
VLDL Cholesterol Cal: 21 mg/dL (ref 5–40)

## 2021-05-25 LAB — CBC WITH DIFFERENTIAL/PLATELET
Basophils Absolute: 0 10*3/uL (ref 0.0–0.2)
Basos: 1 %
EOS (ABSOLUTE): 0.2 10*3/uL (ref 0.0–0.4)
Eos: 3 %
Hematocrit: 40.4 % (ref 34.0–46.6)
Hemoglobin: 13.6 g/dL (ref 11.1–15.9)
Immature Grans (Abs): 0 10*3/uL (ref 0.0–0.1)
Immature Granulocytes: 0 %
Lymphocytes Absolute: 1.7 10*3/uL (ref 0.7–3.1)
Lymphs: 28 %
MCH: 28.1 pg (ref 26.6–33.0)
MCHC: 33.7 g/dL (ref 31.5–35.7)
MCV: 84 fL (ref 79–97)
Monocytes Absolute: 0.5 10*3/uL (ref 0.1–0.9)
Monocytes: 9 %
Neutrophils Absolute: 3.4 10*3/uL (ref 1.4–7.0)
Neutrophils: 59 %
Platelets: 225 10*3/uL (ref 150–450)
RBC: 4.84 x10E6/uL (ref 3.77–5.28)
RDW: 12.4 % (ref 11.7–15.4)
WBC: 5.8 10*3/uL (ref 3.4–10.8)

## 2021-05-25 LAB — COMPREHENSIVE METABOLIC PANEL
ALT: 33 IU/L — ABNORMAL HIGH (ref 0–32)
AST: 22 IU/L (ref 0–40)
Albumin/Globulin Ratio: 2 (ref 1.2–2.2)
Albumin: 4.4 g/dL (ref 3.8–4.8)
Alkaline Phosphatase: 86 IU/L (ref 44–121)
BUN/Creatinine Ratio: 23 (ref 12–28)
BUN: 17 mg/dL (ref 8–27)
Bilirubin Total: 0.5 mg/dL (ref 0.0–1.2)
CO2: 25 mmol/L (ref 20–29)
Calcium: 9.5 mg/dL (ref 8.7–10.3)
Chloride: 98 mmol/L (ref 96–106)
Creatinine, Ser: 0.73 mg/dL (ref 0.57–1.00)
Globulin, Total: 2.2 g/dL (ref 1.5–4.5)
Glucose: 119 mg/dL — ABNORMAL HIGH (ref 70–99)
Potassium: 3.9 mmol/L (ref 3.5–5.2)
Sodium: 138 mmol/L (ref 134–144)
Total Protein: 6.6 g/dL (ref 6.0–8.5)
eGFR: 92 mL/min/{1.73_m2} (ref >59)

## 2021-05-25 LAB — MICROALBUMIN / CREATININE URINE RATIO
Creatinine, Urine: 45.4 mg/dL
Microalb/Creat Ratio: 10 mg/g creat (ref 0–29)
Microalbumin, Urine: 4.6 ug/mL

## 2021-05-25 LAB — INSULIN, RANDOM: INSULIN: 12.5 u[IU]/mL (ref 2.6–24.9)

## 2021-05-25 LAB — VITAMIN D 25 HYDROXY (VIT D DEFICIENCY, FRACTURES): Vit D, 25-Hydroxy: 50.7 ng/mL (ref 30.0–100.0)

## 2021-05-25 LAB — VITAMIN B12: Vitamin B-12: 438 pg/mL (ref 232–1245)

## 2021-05-25 LAB — FOLATE: Folate: 17.4 ng/mL (ref >3.0)

## 2021-05-25 LAB — T3: T3, Total: 141 ng/dL (ref 71–180)

## 2021-05-25 LAB — T4, FREE: Free T4: 1.39 ng/dL (ref 0.82–1.77)

## 2021-05-28 NOTE — Progress Notes (Signed)
Chief Complaint:   OBESITY Alexandra Walker (MR# [id number]) is a 66 y.o. female who presents for evaluation and treatment of obesity and related comorbidities. Current BMI is Body mass index is 42.77 kg/m. Cierria has been struggling with her weight for many years and has been unsuccessful in either losing weight, maintaining weight loss, or reaching her healthy weight goal.  Ambika is currently in the action stage of change and ready to dedicate time achieving and maintaining a healthier weight. Misheel is interested in becoming our patient and working on intensive lifestyle modifications including (but not limited to) diet and exercise for weight loss.  Liliyana is a returning pt. She eats on the way home after work- plain bagel + diet Coke. Pt sleeps in the daytime. She will go get [employer] sandwich + unsweet tea (feels satisfied); Coffee with lactose free milk + vanilla; Lean Cuisine around 3 AM; grab PB crackers or cheese for snack; Days off- eggs + toast; Lunch- fruit; Dinner- pizza or chicken + vegetable; After dinner- popcorn, hershey kisses.  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  Depression Screen Mariadelcarmen's Food and Mood (modified PHQ-9) score was 11.  Depression screen PHQ 2/9 05/24/2021  Decreased Interest 1  Down, Depressed, Hopeless 2  PHQ - 2 Score 3  Altered sleeping 1  Tired, decreased energy 2  Change in appetite 1  Feeling bad or failure about yourself  1  Trouble  concentrating 3  Moving slowly or fidgety/restless 0  Suicidal thoughts 0  PHQ-9 Score 11  Difficult doing work/chores Not difficult at all  Some recent data might be hidden   Subjective:   1. Other fatigue Sienna admits to daytime somnolence and denies waking up still tired. Patent has a history of symptoms of daytime fatigue. Lorena generally gets  4-10  hours of sleep per night, and states that she has generally restful sleep. Snoring is present. Apneic episodes are present. Epworth Sleepiness Score is 3. EKG- bradycardia (sinus), T-wave abnormality seen on some previous EKGs (sees Dr. Angelena Form).  2. SOBOE (shortness of breath on exertion) Lelan Pons notes increasing shortness of breath with exercising and seems to be worsening over time with weight gain. She notes getting out of breath sooner with activity than she used to. This has gotten worse recently. Girtrude denies shortness of breath at rest or orthopnea.  3. Type 2 diabetes mellitus with hyperglycemia, without long-term current use of insulin ([place]) Tasmine was previously on Metformin, Victoza, and Trulicity. She previously had gallstone pancreatitis. Her last eye exam was a few months ago. Her recent A1c was 6.3 (improved from 6.8).  4. Hypertension associated with diabetes ([place]) BP very elevated today. She is on metoprolol, losartan, and HCTZ.  5. NAFLD (nonalcoholic fatty liver disease) Historical diagnosis. Pt did well on low carb.  6. Vitamin D deficiency Pt denies nausea, vomiting, and muscle weakness but notes fatigue. Last Vit D level was 58.  7. Hyperlipidemia associated with type 2 diabetes mellitus ([place]) Pt is not on statin therapy. Her last  LDL was controlled at 65.  8. Sleep hypopnea Pt sees [facility]. Brett [facility] at [facility]. She has a CPAP that she wears consistently.  Assessment/Plan:   1. Other fatigue Perina does feel that her weight is causing her energy to be lower than it should be. Fatigue may be related to obesity,  depression or many other causes. Labs will be ordered, and in the meanwhile, Jeraldin will focus on self care including making healthy food choices, increasing physical activity and focusing on stress reduction. Check labs today.  - EKG 12-Lead - Vitamin B12 - Folate - T3 - T4, free - TSH  2. SOBOE (shortness of breath on exertion) Sharonlee does feel that she gets out of breath more easily that she used to when she exercises. Hawley's shortness of breath appears to be obesity related and exercise induced. She has agreed to work on weight loss and gradually increase exercise to treat her exercise induced shortness of breath. Will continue to monitor closely. Check labs today.  - CBC with Differential/Platelet  3. Type 2 diabetes mellitus with hyperglycemia, without long-term current use of insulin ([facility]) Good blood sugar control is important to decrease the likelihood of diabetic complications such as nephropathy, neuropathy, limb loss, blindness, coronary artery disease, and death. Intensive lifestyle modification including diet, exercise and weight loss are the first line of treatment for diabetes. Check labs today.  - Comprehensive metabolic panel - Insulin, random - Urine Microalbumin w/creat. ratio  4. Hypertension associated with diabetes ([place]) Amarah is working on healthy weight loss and exercise to improve blood pressure control. We will watch for signs of hypotension as she continues her lifestyle modifications. Check labs today.  5. NAFLD (nonalcoholic fatty liver disease) We discussed the likely diagnosis of non-alcoholic fatty liver disease today and how this condition is obesity related. Rebekah was educated the importance of weight loss. Arthurine agreed to continue with her weight loss efforts with healthier diet and exercise as an essential part of her treatment plan.  6. Vitamin D deficiency Low Vitamin D level contributes to fatigue and are associated with obesity, breast, and colon  cancer. She will follow-up for routine testing of Vitamin D, at least 2-3 times per year to avoid over-replacement. Check labs today.  - VITAMIN D 25 Hydroxy (Vit-D Deficiency, Fractures)  7. Hyperlipidemia associated with type 2 diabetes mellitus ([place]) Cardiovascular risk and specific lipid/LDL goals reviewed.  We discussed several lifestyle modifications today and Lamoyne will continue to work on diet, exercise and weight loss efforts. Orders and follow up as documented in patient record.   Counseling Intensive lifestyle modifications are the first line treatment for this issue. Dietary changes: Increase soluble fiber. Decrease simple carbohydrates. Exercise changes: Moderate to vigorous-intensity aerobic activity 150 minutes per week if tolerated. Lipid-lowering medications: see documented in medical record. Check labs today.  - Lipid Panel With LDL/HDL Ratio  8. Sleep hypopnea Intensive lifestyle modifications are the first line treatment for this issue. We discussed several lifestyle modifications today and she will continue to work on diet, exercise and weight loss efforts. We will continue to monitor. Orders and follow up as documented in patient record. Follow up with Dr. Brett Fairy. Pt has at home sleep study in 1 week.  9. Depression screening Darci had a positive depression screening. Depression is commonly associated with obesity and often results in emotional eating behaviors. We will monitor this closely and work on CBT to help improve the non-hunger eating patterns. Referral to Psychology may be required if no  improvement is seen as she continues in our clinic.  10. Obesity with current BMI of 42.8  Giavonna is currently in the action stage of change and her goal is to continue with weight loss efforts. I recommend Janiah begin the structured treatment plan as follows:  She has agreed to the Category 3 Plan and keeping a food journal and adhering to recommended goals of 1450-1600  calories and 95+ grams protein.  Exercise goals: No exercise has been prescribed at this time.   Behavioral modification strategies: increasing lean protein intake, no skipping meals, meal planning and cooking strategies, and planning for success.  She was informed of the importance of frequent follow-up visits to maximize her success with intensive lifestyle modifications for her multiple health conditions. She was informed we would discuss her lab results at her next visit unless there is a critical issue that needs to be addressed sooner. Maud agreed to keep her next visit at the agreed upon time to discuss these results.  Objective:   Blood pressure (!) 159/81, pulse (!) 56, temperature 97.7 F (36.5 C), height 5\' 6"  (1.676 m), weight 265 lb (120.2 kg), last menstrual period 05/20/2008, SpO2 100 %. Body mass index is 42.77 kg/m.  EKG: Sinus rhythm, rate 59- bradycardia.  Indirect Calorimeter completed today shows a VO2 of 268 and a REE of 1843.  Her calculated basal metabolic rate is 3664 thus her basal metabolic rate is better than expected.  General: Cooperative, alert, well developed, in no acute distress. HEENT: Conjunctivae and lids unremarkable. Cardiovascular: Regular rhythm.  Lungs: Normal work of breathing. Neurologic: No focal deficits.   Lab Results  Component Value Date   CREATININE 0.73 05/24/2021   BUN 17 05/24/2021   NA 138 05/24/2021   K 3.9 05/24/2021   CL 98 05/24/2021   CO2 25 05/24/2021   Lab Results  Component Value Date   ALT 33 (H) 05/24/2021   AST 22 05/24/2021   ALKPHOS 86 05/24/2021   BILITOT 0.5 05/24/2021   Lab Results  Component Value Date   HGBA1C 6.3 (A) 05/07/2021   HGBA1C 6.8 (H) 11/13/2020   HGBA1C 6.0 (H) 05/05/2020   HGBA1C 5.5 05/05/2020   HGBA1C 5.6 11/01/2019   Lab Results  Component Value Date   INSULIN 12.5 05/24/2021   INSULIN 12.5 03/13/2017   INSULIN 10.0 10/28/2016   Lab Results  Component Value Date   TSH 1.960  05/24/2021   Lab Results  Component Value Date   CHOL 145 05/24/2021   HDL 47 05/24/2021   LDLCALC 77 05/24/2021   LDLDIRECT 118.0 05/24/2019   TRIG 114 05/24/2021   CHOLHDL 3 11/13/2020   Lab Results  Component Value Date   WBC 5.8 05/24/2021   HGB 13.6 05/24/2021   HCT 40.4 05/24/2021   MCV 84 05/24/2021   PLT 225 05/24/2021    Attestation Statements:   Reviewed by clinician on day of visit: allergies, medications, problem list, medical history, surgical history, family history, social history, and previous encounter notes.  Coral Ceo, CMA, am acting as transcriptionist for Coralie Common, MD.   This is the patient's first visit at [facility]. The patient's NEW PATIENT PACKET was reviewed at length. Included in the packet: current and past health history, medications, allergies, ROS, gynecologic history (women only), surgical history, family history, social history, weight history, weight loss surgery history (for those that have had weight loss surgery), nutritional evaluation, mood and food questionnaire, PHQ9, Epworth questionnaire, sleep habits  questionnaire, patient life and health improvement goals questionnaire. These will all be scanned into the patient's chart under media.   During the visit, I independently reviewed the patient's EKG, bioimpedance scale results, and indirect calorimeter results. I used this information to tailor a meal plan for the patient that will help her to lose weight and will improve her obesity-related conditions going forward. I performed a medically necessary appropriate examination and/or evaluation. I discussed the assessment and treatment plan with the patient. The patient was provided an opportunity to ask questions and all were answered. The patient agreed with the plan and demonstrated an understanding of the instructions. Labs were ordered at this visit and will be reviewed at the next visit unless more critical  results need to be addressed immediately. Clinical information was updated and documented in the EMR.   Time spent on visit including pre-visit chart review and post-visit care was 60 minutes.   A separate 15 minutes was spent on risk counseling (see above). .I have reviewed the above documentation for accuracy and completeness, and I agree with the above. - Coralie Common, MD

## 2021-06-04 ENCOUNTER — Ambulatory Visit: Payer: 59 | Admitting: Neurology

## 2021-06-04 DIAGNOSIS — R7301 Impaired fasting glucose: Secondary | ICD-10-CM

## 2021-06-04 DIAGNOSIS — K853 Drug induced acute pancreatitis without necrosis or infection: Secondary | ICD-10-CM

## 2021-06-04 DIAGNOSIS — G4733 Obstructive sleep apnea (adult) (pediatric): Secondary | ICD-10-CM

## 2021-06-04 DIAGNOSIS — G4726 Circadian rhythm sleep disorder, shift work type: Secondary | ICD-10-CM

## 2021-06-05 ENCOUNTER — Encounter: Payer: Self-pay | Admitting: Neurology

## 2021-06-07 ENCOUNTER — Ambulatory Visit (INDEPENDENT_AMBULATORY_CARE_PROVIDER_SITE_OTHER): Payer: 59 | Admitting: Family Medicine

## 2021-06-07 ENCOUNTER — Encounter (INDEPENDENT_AMBULATORY_CARE_PROVIDER_SITE_OTHER): Payer: Self-pay | Admitting: Family Medicine

## 2021-06-07 ENCOUNTER — Other Ambulatory Visit: Payer: Self-pay

## 2021-06-07 ENCOUNTER — Encounter (INDEPENDENT_AMBULATORY_CARE_PROVIDER_SITE_OTHER): Payer: Self-pay

## 2021-06-07 ENCOUNTER — Other Ambulatory Visit (HOSPITAL_BASED_OUTPATIENT_CLINIC_OR_DEPARTMENT_OTHER): Payer: Self-pay

## 2021-06-07 VITALS — BP 118/71 | HR 65 | Temp 98.5°F | Ht 66.0 in | Wt 259.0 lb

## 2021-06-07 DIAGNOSIS — E785 Hyperlipidemia, unspecified: Secondary | ICD-10-CM

## 2021-06-07 DIAGNOSIS — Z6841 Body Mass Index (BMI) 40.0 and over, adult: Secondary | ICD-10-CM

## 2021-06-07 DIAGNOSIS — Z9189 Other specified personal risk factors, not elsewhere classified: Secondary | ICD-10-CM

## 2021-06-07 DIAGNOSIS — E1169 Type 2 diabetes mellitus with other specified complication: Secondary | ICD-10-CM

## 2021-06-07 DIAGNOSIS — E1165 Type 2 diabetes mellitus with hyperglycemia: Secondary | ICD-10-CM

## 2021-06-07 DIAGNOSIS — E559 Vitamin D deficiency, unspecified: Secondary | ICD-10-CM

## 2021-06-07 DIAGNOSIS — I10 Essential (primary) hypertension: Secondary | ICD-10-CM | POA: Diagnosis not present

## 2021-06-07 DIAGNOSIS — E669 Obesity, unspecified: Secondary | ICD-10-CM

## 2021-06-07 DIAGNOSIS — E1159 Type 2 diabetes mellitus with other circulatory complications: Secondary | ICD-10-CM

## 2021-06-07 MED ORDER — OZEMPIC (0.25 OR 0.5 MG/DOSE) 2 MG/1.5ML ~~LOC~~ SOPN
0.2500 mg | PEN_INJECTOR | SUBCUTANEOUS | 0 refills | Status: DC
  Filled 2021-06-07: qty 1.5, 56d supply, fill #0

## 2021-06-07 NOTE — Telephone Encounter (Signed)
FYI

## 2021-06-07 NOTE — Progress Notes (Signed)
Chief Complaint:   OBESITY Alexandra Walker is here to discuss her progress with her obesity treatment plan along with follow-up of her obesity related diagnoses. Alexandra Walker is on keeping a food journal and adhering to recommended goals of 1450-1600 calories and 95+ grams protein and states she is following her eating plan approximately 80-90% of the time. Alexandra Walker states she is walking at work 3500-4000 steps 3 times per week.  Today's visit was #: 2 Starting weight: 265 lbs Starting date: 05/24/2021 Today's weight: 259 lbs Today's date: 06/07/2021 Total lbs lost to date: 6 Total lbs lost since last in-office visit: 6  Interim History: Pt got stuck in [facility] airport and did get a cold from close contact. The first week, she did not follow plan much, as she was traveling and so she started the 2nd week. Some days, she doesn't eat enough, especially on her night shifts. Some days <1000 calories. Probably ~1300 calories. The next few weeks are more of the same- work and rest.  Subjective:   1. Essential hypertension BP well controlled today (previously BP elevated). Pt denies chest pain/chest pressure/headache. She is on losartan, Toprol, and HCTZ.  2. Type 2 diabetes mellitus with hyperglycemia, without long-term current use of insulin (HCC) Pt's last A1c was 6.3 with an insulin level of 12.5. She is not on meds. Pt was previously on Metformin and Victoza- had gallstones, then pancreatitis.  3. Hyperlipidemia associated with type 2 diabetes mellitus ([place]) Pt has an LDL of 77, HDL 47, and triglycerides 114 and is on Crestor. She has no myalgias. Transaminases of ALT slightly elevated..  4. Vitamin D deficiency Pt denies nausea, vomiting, and muscle weakness but notes fatigue. She is on OTC Vit D 2K QD.  5. At risk for hyperglycemia Alexandra Walker is at increased risk for hyperglycemia due to changes in diet, diagnosis of diabetes, and/or insulin use.   Assessment/Plan:   1. Essential  hypertension Alexandra Walker is working on healthy weight loss and exercise to improve blood pressure control. We will watch for signs of hypotension as she continues her lifestyle modifications. Continue current treatment plan. If BP stays controlled, we will start to decrease dose of meds.  2. Type 2 diabetes mellitus with hyperglycemia, without long-term current use of insulin (HCC) Good blood sugar control is important to decrease the likelihood of diabetic complications such as nephropathy, neuropathy, limb loss, blindness, coronary artery disease, and death. Intensive lifestyle modification including diet, exercise and weight loss are the first line of treatment for diabetes. Pt will start Ozempic 0.25 mg. Pt has been counseled on risks/benefits of medication, given h/o pancreatitis and gallstones- she agrees to try Ozempic.  Start- Semaglutide,0.25 or 0.5MG /DOS, (OZEMPIC, 0.25 OR 0.5 MG/DOSE,) 2 MG/1.5ML SOPN; Inject 0.25 mg into the skin once a week.  Dispense: 1.5 mL; Refill: 0  3. Hyperlipidemia associated with type 2 diabetes mellitus ([place]) Cardiovascular risk and specific lipid/LDL goals reviewed.  We discussed several lifestyle modifications today and Alexandra Walker will continue to work on diet, exercise and weight loss efforts. Orders and follow up as documented in patient record. Continue current treatment plan.  Counseling Intensive lifestyle modifications are the first line treatment for this issue. Dietary changes: Increase soluble fiber. Decrease simple carbohydrates. Exercise changes: Moderate to vigorous-intensity aerobic activity 150 minutes per week if tolerated. Lipid-lowering medications: see documented in medical record.  4. Vitamin D deficiency Low Vitamin D level contributes to fatigue and are associated with obesity, breast, and colon cancer. She agrees to  continue to take OTC Vitamin D 2,000 IU daily and will follow-up for routine testing of Vitamin D, at least 2-3 times per year to  avoid over-replacement.  5. At risk for hyperglycemia Alexandra Walker was given approximately 30 minutes of counseling today regarding prevention of hyperglycemia. She was advised of hyperglycemia causes and the fact hyperglycemia is often asymptomatic. Sung was instructed to avoid skipping meals, eat regular protein rich meals and schedule low calorie but protein rich snacks as needed.   Repetitive spaced learning was employed today to elicit superior memory formation and behavioral change.  6. Obesity with current BMI of 42.8  Alexandra Walker is currently in the action stage of change. As such, her goal is to continue with weight loss efforts. She has agreed to keeping a food journal and adhering to recommended goals of 1450-1600 calories and 95+ grams protein.   Exercise goals:  As is  Behavioral modification strategies: increasing lean protein intake, meal planning and cooking strategies, keeping healthy foods in the home, planning for success, and keeping a strict food journal.  Alexandra Walker has agreed to follow-up with our clinic in 2 weeks. She was informed of the importance of frequent follow-up visits to maximize her success with intensive lifestyle modifications for her multiple health conditions.   Objective:   Blood pressure 118/71, pulse 65, temperature 98.5 F (36.9 C), height 5\' 6"  (1.676 m), weight 259 lb (117.5 kg), last menstrual period 05/20/2008, SpO2 97 %. Body mass index is 41.8 kg/m.  General: Cooperative, alert, well developed, in no acute distress. HEENT: Conjunctivae and lids unremarkable. Cardiovascular: Regular rhythm.  Lungs: Normal work of breathing. Neurologic: No focal deficits.   Lab Results  Component Value Date   CREATININE 0.73 05/24/2021   BUN 17 05/24/2021   NA 138 05/24/2021   K 3.9 05/24/2021   CL 98 05/24/2021   CO2 25 05/24/2021   Lab Results  Component Value Date   ALT 33 (H) 05/24/2021   AST 22 05/24/2021   ALKPHOS 86 05/24/2021   BILITOT 0.5 05/24/2021    Lab Results  Component Value Date   HGBA1C 6.3 (A) 05/07/2021   HGBA1C 6.8 (H) 11/13/2020   HGBA1C 6.0 (H) 05/05/2020   HGBA1C 5.5 05/05/2020   HGBA1C 5.6 11/01/2019   Lab Results  Component Value Date   INSULIN 12.5 05/24/2021   INSULIN 12.5 03/13/2017   INSULIN 10.0 10/28/2016   Lab Results  Component Value Date   TSH 1.960 05/24/2021   Lab Results  Component Value Date   CHOL 145 05/24/2021   HDL 47 05/24/2021   LDLCALC 77 05/24/2021   LDLDIRECT 118.0 05/24/2019   TRIG 114 05/24/2021   CHOLHDL 3 11/13/2020   Lab Results  Component Value Date   VD25OH 50.7 05/24/2021   VD25OH 58.00 11/13/2020   VD25OH 44.27 10/22/2018   Lab Results  Component Value Date   WBC 5.8 05/24/2021   HGB 13.6 05/24/2021   HCT 40.4 05/24/2021   MCV 84 05/24/2021   PLT 225 05/24/2021   Attestation Statements:   Reviewed by clinician on day of visit: allergies, medications, problem list, medical history, surgical history, family history, social history, and previous encounter notes.  Coral Ceo, CMA, am acting as transcriptionist for Coralie Common, MD.   I have reviewed the above documentation for accuracy and completeness, and I agree with the above. - Coralie Common, MD

## 2021-06-11 NOTE — Telephone Encounter (Signed)
Please advise 

## 2021-06-25 ENCOUNTER — Encounter (INDEPENDENT_AMBULATORY_CARE_PROVIDER_SITE_OTHER): Payer: Self-pay | Admitting: Family Medicine

## 2021-06-25 ENCOUNTER — Ambulatory Visit (INDEPENDENT_AMBULATORY_CARE_PROVIDER_SITE_OTHER): Payer: 59 | Admitting: Family Medicine

## 2021-06-25 ENCOUNTER — Other Ambulatory Visit: Payer: Self-pay

## 2021-06-25 ENCOUNTER — Ambulatory Visit (INDEPENDENT_AMBULATORY_CARE_PROVIDER_SITE_OTHER): Payer: 59 | Admitting: Neurology

## 2021-06-25 VITALS — BP 125/77 | HR 74 | Temp 98.1°F | Ht 66.0 in | Wt 255.0 lb

## 2021-06-25 DIAGNOSIS — E669 Obesity, unspecified: Secondary | ICD-10-CM

## 2021-06-25 DIAGNOSIS — E559 Vitamin D deficiency, unspecified: Secondary | ICD-10-CM

## 2021-06-25 DIAGNOSIS — G4733 Obstructive sleep apnea (adult) (pediatric): Secondary | ICD-10-CM

## 2021-06-25 DIAGNOSIS — G4726 Circadian rhythm sleep disorder, shift work type: Secondary | ICD-10-CM

## 2021-06-25 DIAGNOSIS — R7301 Impaired fasting glucose: Secondary | ICD-10-CM

## 2021-06-25 DIAGNOSIS — E1165 Type 2 diabetes mellitus with hyperglycemia: Secondary | ICD-10-CM | POA: Diagnosis not present

## 2021-06-25 DIAGNOSIS — Z7985 Long-term (current) use of injectable non-insulin antidiabetic drugs: Secondary | ICD-10-CM

## 2021-06-25 DIAGNOSIS — Z6841 Body Mass Index (BMI) 40.0 and over, adult: Secondary | ICD-10-CM

## 2021-06-25 DIAGNOSIS — K853 Drug induced acute pancreatitis without necrosis or infection: Secondary | ICD-10-CM

## 2021-06-26 NOTE — Progress Notes (Signed)
Chief Complaint:   OBESITY Alexandra Walker is here to discuss her progress with her obesity treatment plan along with follow-up of her obesity related diagnoses. Alexandra Walker is on keeping a food journal and adhering to recommended goals of 1450-1600 calories and 95+ grams protein and states she is following her eating plan approximately 20% of the time. Alexandra Walker states she is not currently exercising.  Today's visit was #: 3 Starting weight: 265 lbs Starting date: 05/24/2021 Today's weight: 255 lbs Today's date: 06/25/2021 Total lbs lost to date: 10 Total lbs lost since last in-office visit: 4  Interim History: Pt realizes she isn't journaling as well as previously because of increased stress at work. Some days she isn't hitting calorie goals. She is trying to be mindful of food choices. Pt is going to neuromuscular massage therapy. She is having hand surgery at the end of February. Pt having some trouble getting all 3 eggs together in at breakfast. She is looking for increased probability of food options. She is probably ending around ~65 grams protein per day.   Subjective:   1. Type 2 diabetes mellitus with hyperglycemia, without long-term current use of insulin (HCC) Pt started Ozempic 0.25 mg weekly with no nausea or GI side effects.  2. Vitamin D deficiency Alexandra Walker denies nausea, vomiting, and muscle weakness. She is on OTC Vit D.  Assessment/Plan:   1. Type 2 diabetes mellitus with hyperglycemia, without long-term current use of insulin (HCC) Good blood sugar control is important to decrease the likelihood of diabetic complications such as nephropathy, neuropathy, limb loss, blindness, coronary artery disease, and death. Intensive lifestyle modification including diet, exercise and weight loss are the first line of treatment for diabetes. Continue Ozempic with no change in dose.  2. Vitamin D deficiency Low Vitamin D level contributes to fatigue and are associated with obesity, breast, and colon  cancer. She agrees to continue to take OTC Vitamin D  and will follow-up for routine testing of Vitamin D, at least 2-3 times per year to avoid over-replacement.  3. Obesity with current BMI of 41.3 Alexandra Walker is currently in the action stage of change. As such, her goal is to continue with weight loss efforts. She has agreed to keeping a food journal and adhering to recommended goals of 1450-1600 calories and 95+ grams protein.   Exercise goals: All adults should avoid inactivity. Some physical activity is better than none, and adults who participate in any amount of physical activity gain some health benefits.  Behavioral modification strategies: increasing lean protein intake, meal planning and cooking strategies, keeping healthy foods in the home, planning for success, and keeping a strict food journal.  Alexandra Walker has agreed to follow-up with our clinic in 2-3 weeks. She was informed of the importance of frequent follow-up visits to maximize her success with intensive lifestyle modifications for her multiple health conditions.   Objective:   Blood pressure 125/77, pulse 74, temperature 98.1 F (36.7 C), height 5\' 6"  (1.676 m), weight 255 lb (115.7 kg), last menstrual period 05/20/2008, SpO2 96 %. Body mass index is 41.16 kg/m.  General: Cooperative, alert, well developed, in no acute distress. HEENT: Conjunctivae and lids unremarkable. Cardiovascular: Regular rhythm.  Lungs: Normal work of breathing. Neurologic: No focal deficits.   Lab Results  Component Value Date   CREATININE 0.73 05/24/2021   BUN 17 05/24/2021   NA 138 05/24/2021   K 3.9 05/24/2021   CL 98 05/24/2021   CO2 25 05/24/2021   Lab Results  Component Value Date   ALT 33 (H) 05/24/2021   AST 22 05/24/2021   ALKPHOS 86 05/24/2021   BILITOT 0.5 05/24/2021   Lab Results  Component Value Date   HGBA1C 6.3 (A) 05/07/2021   HGBA1C 6.8 (H) 11/13/2020   HGBA1C 6.0 (H) 05/05/2020   HGBA1C 5.5 05/05/2020   HGBA1C 5.6  11/01/2019   Lab Results  Component Value Date   INSULIN 12.5 05/24/2021   INSULIN 12.5 03/13/2017   INSULIN 10.0 10/28/2016   Lab Results  Component Value Date   TSH 1.960 05/24/2021   Lab Results  Component Value Date   CHOL 145 05/24/2021   HDL 47 05/24/2021   LDLCALC 77 05/24/2021   LDLDIRECT 118.0 05/24/2019   TRIG 114 05/24/2021   CHOLHDL 3 11/13/2020   Lab Results  Component Value Date   VD25OH 50.7 05/24/2021   VD25OH 58.00 11/13/2020   VD25OH 44.27 10/22/2018   Lab Results  Component Value Date   WBC 5.8 05/24/2021   HGB 13.6 05/24/2021   HCT 40.4 05/24/2021   MCV 84 05/24/2021   PLT 225 05/24/2021    Attestation Statements:   Reviewed by clinician on day of visit: allergies, medications, problem list, medical history, surgical history, family history, social history, and previous encounter notes.  Coral Ceo, CMA, am acting as transcriptionist for Coralie Common, MD.  I have reviewed the above documentation for accuracy and completeness, and I agree with the above. - Coralie Common, MD

## 2021-06-26 NOTE — Progress Notes (Signed)
[facility] TEST REPORT ( by Watch PAT)   STUDY DATE:  06-25-2021   ORDERING CLINICIAN: Larey Seat, MD  REFERRING CLINICIAN: Billey Chang, MD  and Briscoe Deutscher, DO   CLINICAL INFORMATION/HISTORY: 04-09-2021, RV for my established sleep patient, Alexandra Walker.  She contracted Covid in July and was feeling pretty back to baseline within less than a week. 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  Has a cumulative sleep deficit at the week end , making up by sleeping `12 hours off call- live style is a 4 day weekend. She is here because her CPAP machine is going to be 64 years old now and will be replaced, I will order a new HST baseline.  She was doing well at 8 cm water, 2 cm EPR.  Epworth sleepiness score: 5/24.   BMI: 42 kg/m   Neck Circumference: 15"   FINDINGS:   Sleep Summary:   Total Recording Time (hours, min):   Total recording time for this home sleep test amounted to 7 hours and 46 minutes of which 7 hours and 4 minutes were total calculated sleep time.   Of the sleep time 33.2% for REM sleep time.                              Respiratory Indices:   Calculated pAHI (per hour): 13.7/h during REM sleep 20.9/h and in non-REM sleep 10.2/h.                                                    Positional AHI: Right-sided sleep position was associated with an AHI of 16.4, prone sleep with an AHI of 10.6/h and supine sleep was 14.1/h.  The majority of the night the patient was recorded in supine sleep.                                                 Oxygen Saturation Statistics:   O2 Saturation Range (%): Varied between a nadir of 88% and maximum of 97%, and the mean value of 92%.                                      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  There is no associated hypoxia and heart rate variability is in normal range.  The patient will continue to use CPAP her new device will be an auto titration device with a setting between 6 and 14 cmH2O, 2 cm EPR and a  mask of her choice.  Heated humidification will be provided . I will request a ResMed device.   RECOMMENDATION:The patient will continue to use CPAP;  her new device will be an auto-titration device with a pressure setting between 6 and 14 cmH2O, 2 cm EPR, and a mask of her choice.  Heated humidification will be provided . I will request a ResMed device.    INTERPRETING PHYSICIAN:   Larey Seat, MD   Medical Director of [facility].

## 2021-06-29 DIAGNOSIS — L820 Inflamed seborrheic keratosis: Secondary | ICD-10-CM | POA: Diagnosis not present

## 2021-06-29 DIAGNOSIS — D225 Melanocytic nevi of trunk: Secondary | ICD-10-CM | POA: Diagnosis not present

## 2021-06-29 DIAGNOSIS — L821 Other seborrheic keratosis: Secondary | ICD-10-CM | POA: Diagnosis not present

## 2021-06-29 DIAGNOSIS — L538 Other specified erythematous conditions: Secondary | ICD-10-CM | POA: Diagnosis not present

## 2021-06-29 DIAGNOSIS — L608 Other nail disorders: Secondary | ICD-10-CM | POA: Diagnosis not present

## 2021-06-29 DIAGNOSIS — L578 Other skin changes due to chronic exposure to nonionizing radiation: Secondary | ICD-10-CM | POA: Diagnosis not present

## 2021-06-29 DIAGNOSIS — D485 Neoplasm of uncertain behavior of skin: Secondary | ICD-10-CM | POA: Diagnosis not present

## 2021-07-09 ENCOUNTER — Encounter (INDEPENDENT_AMBULATORY_CARE_PROVIDER_SITE_OTHER): Payer: Self-pay | Admitting: Bariatrics

## 2021-07-09 ENCOUNTER — Ambulatory Visit (INDEPENDENT_AMBULATORY_CARE_PROVIDER_SITE_OTHER): Payer: 59 | Admitting: Bariatrics

## 2021-07-09 ENCOUNTER — Other Ambulatory Visit: Payer: Self-pay

## 2021-07-09 VITALS — BP 115/69 | HR 65 | Temp 98.4°F | Ht 66.0 in | Wt 255.0 lb

## 2021-07-09 DIAGNOSIS — E669 Obesity, unspecified: Secondary | ICD-10-CM | POA: Diagnosis not present

## 2021-07-09 DIAGNOSIS — Z6841 Body Mass Index (BMI) 40.0 and over, adult: Secondary | ICD-10-CM

## 2021-07-09 DIAGNOSIS — Z7985 Long-term (current) use of injectable non-insulin antidiabetic drugs: Secondary | ICD-10-CM | POA: Diagnosis not present

## 2021-07-09 DIAGNOSIS — E1169 Type 2 diabetes mellitus with other specified complication: Secondary | ICD-10-CM

## 2021-07-09 DIAGNOSIS — E785 Hyperlipidemia, unspecified: Secondary | ICD-10-CM

## 2021-07-09 DIAGNOSIS — E1165 Type 2 diabetes mellitus with hyperglycemia: Secondary | ICD-10-CM

## 2021-07-09 NOTE — Progress Notes (Signed)
Chief Complaint:   OBESITY Alexandra Walker is here to discuss her progress with her obesity treatment plan along with follow-up of her obesity related diagnoses. Alexandra Walker is on keeping a food journal and adhering to recommended goals of 1450-1600 calories and 75+ grams of protein daily and states she is following her eating plan approximately 90% of the time. Alexandra Walker states she is doing  0 minutes 0 times per week.  Today's visit was #: 4 Starting weight: 265 lbs Starting date: 05/24/2021 Today's weight: 255 lbs Today's date: 07/09/2021 Total lbs lost to date: 10 Total lbs lost since last in-office visit: 0  Interim History: Alexandra Walker's weight remains the same as her last visit. She is eating more protein.  Subjective:   1. Type 2 diabetes mellitus with hyperglycemia, without long-term current use of insulin ([place]) Alexandra Walker is taking Ozempic, and notes it has helped with the cravings.  2. Hyperlipidemia associated with type 2 diabetes mellitus ([place]) Alexandra Walker is taking Omega 3 and Crestor.  Assessment/Plan:   1. Type 2 diabetes mellitus with hyperglycemia, without long-term current use of insulin ([place]) Alexandra Walker will continue Ozempic. Good blood sugar control is important to decrease the likelihood of diabetic complications such as nephropathy, neuropathy, limb loss, blindness, coronary artery disease, and death. Intensive lifestyle modification including diet, exercise and weight loss are the first line of treatment for diabetes.   2. Hyperlipidemia associated with type 2 diabetes mellitus ([place]) Cardiovascular risk and specific lipid/LDL goals reviewed.  We discussed several lifestyle modifications today. Alexandra Walker will continue her medications, and will continue to work on diet, exercise and weight loss efforts. Orders and follow up as documented in patient record.   Counseling Intensive lifestyle modifications are the first line treatment for this issue. Dietary changes: Increase soluble fiber. Decrease simple  carbohydrates. Exercise changes: Moderate to vigorous-intensity aerobic activity 150 minutes per week if tolerated. Lipid-lowering medications: see documented in medical record.  3. Obesity with current BMI of 41.2 Alexandra Walker is currently in the action stage of change. As such, her goal is to continue with weight loss efforts. She has agreed to keeping a food journal and adhering to recommended goals of 1450-1600 calories and 95+ grams of protein daily.   Alexandra Walker will continue to adhere to the plan at least 80-90%. Increase water, decrease diet Coke.  Exercise goals: Start slowly on the treadmill and increase.  Behavioral modification strategies: increasing lean protein intake, decreasing simple carbohydrates, increasing vegetables, increasing water intake, no skipping meals, meal planning and cooking strategies, keeping healthy foods in the home, ways to avoid boredom eating, and planning for success.  Alexandra Walker has agreed to follow-up with our clinic in 2 weeks with Dr. Jearld Shines. She was informed of the importance of frequent follow-up visits to maximize her success with intensive lifestyle modifications for her multiple health conditions.   Objective:   Blood pressure 115/69, pulse 65, temperature 98.4 F (36.9 C), height 5\' 6"  (1.676 m), weight 255 lb (115.7 kg), last menstrual period 05/20/2008, SpO2 97 %. Body mass index is 41.16 kg/m.  General: Cooperative, alert, well developed, in no acute distress. HEENT: Conjunctivae and lids unremarkable. Cardiovascular: Regular rhythm.  Lungs: Normal work of breathing. Neurologic: No focal deficits.   Lab Results  Component Value Date   CREATININE 0.73 05/24/2021   BUN 17 05/24/2021   NA 138 05/24/2021   K 3.9 05/24/2021   CL 98 05/24/2021   CO2 25 05/24/2021   Lab Results  Component Value Date   ALT  33 (H) 05/24/2021   AST 22 05/24/2021   ALKPHOS 86 05/24/2021   BILITOT 0.5 05/24/2021   Lab Results  Component Value Date   HGBA1C 6.3 (A)  05/07/2021   HGBA1C 6.8 (H) 11/13/2020   HGBA1C 6.0 (H) 05/05/2020   HGBA1C 5.5 05/05/2020   HGBA1C 5.6 11/01/2019   Lab Results  Component Value Date   INSULIN 12.5 05/24/2021   INSULIN 12.5 03/13/2017   INSULIN 10.0 10/28/2016   Lab Results  Component Value Date   TSH 1.960 05/24/2021   Lab Results  Component Value Date   CHOL 145 05/24/2021   HDL 47 05/24/2021   LDLCALC 77 05/24/2021   LDLDIRECT 118.0 05/24/2019   TRIG 114 05/24/2021   CHOLHDL 3 11/13/2020   Lab Results  Component Value Date   VD25OH 50.7 05/24/2021   VD25OH 58.00 11/13/2020   VD25OH 44.27 10/22/2018   Lab Results  Component Value Date   WBC 5.8 05/24/2021   HGB 13.6 05/24/2021   HCT 40.4 05/24/2021   MCV 84 05/24/2021   PLT 225 05/24/2021   No results found for: IRON, TIBC, FERRITIN  Attestation Statements:   Reviewed by clinician on day of visit: allergies, medications, problem list, medical history, surgical history, family history, social history, and previous encounter notes.   Wilhemena Durie, am acting as Location manager for [employer], DO.  I have reviewed the above documentation for accuracy and completeness, and I agree with the above. Jearld Lesch, DO

## 2021-07-10 ENCOUNTER — Other Ambulatory Visit (HOSPITAL_BASED_OUTPATIENT_CLINIC_OR_DEPARTMENT_OTHER): Payer: Self-pay

## 2021-07-10 ENCOUNTER — Encounter (INDEPENDENT_AMBULATORY_CARE_PROVIDER_SITE_OTHER): Payer: Self-pay | Admitting: Bariatrics

## 2021-07-13 ENCOUNTER — Encounter: Payer: Self-pay | Admitting: Neurology

## 2021-07-13 ENCOUNTER — Other Ambulatory Visit: Payer: Self-pay | Admitting: Neurology

## 2021-07-13 DIAGNOSIS — G4726 Circadian rhythm sleep disorder, shift work type: Secondary | ICD-10-CM

## 2021-07-13 DIAGNOSIS — Z9989 Dependence on other enabling machines and devices: Secondary | ICD-10-CM

## 2021-07-13 DIAGNOSIS — G4733 Obstructive sleep apnea (adult) (pediatric): Secondary | ICD-10-CM

## 2021-07-13 NOTE — Procedures (Signed)
[facility] [place] [place] TEST REPORT ( by Watch PAT)   STUDY DATE:  06-25-2021   ORDERING CLINICIAN: Larey Seat, MD  REFERRING CLINICIAN: Billey Chang, MD  and Briscoe Deutscher, DO   CLINICAL INFORMATION/HISTORY: 04-09-2021, RV for my established sleep patient, Alexandra Walker.  She contracted Covid in July and was feeling pretty back to baseline within less than a week. 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  Has a cumulative sleep deficit at the week end , making up by sleeping `12 hours off call- live style is a 4 day weekend. She is here because her CPAP machine is going to be 64 years old now and will be replaced, I will order a new HST baseline.  She was doing well at 8 cm water, 2 cm EPR.  Epworth sleepiness score: 5/24.   BMI: 42 kg/m   Neck Circumference: 15"   FINDINGS:   Sleep Summary:   Total Recording Time (hours, min):   Total recording time for this home sleep test amounted to 7 hours and 46 minutes of which 7 hours and 4 minutes were total calculated sleep time.   Of the sleep time 33.2% for REM sleep time.                              Respiratory Indices:   Calculated pAHI (per hour): 13.7/h during REM sleep 20.9/h and in non-REM sleep 10.2/h.                                                    Positional AHI: Right-sided sleep position was associated with an AHI of 16.4, prone sleep with an AHI of 10.6/h and supine sleep was 14.1/h.  The majority of the night the patient was recorded in supine sleep.                                                 Oxygen Saturation Statistics:   O2 Saturation Range (%): Varied between a nadir of 88% and maximum of 97%, and the mean value of 92%.                                      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  There is no associated hypoxia and heart rate variability is in normal range.  The patient will continue to use CPAP her new device will be an auto titration device with a setting between 6 and 14 cmH2O, 2 cm EPR and a mask of her choice.  Heated humidification will be provided . I will request a ResMed device.   RECOMMENDATION:The patient will continue to use CPAP;  her new device will be an auto-titration device with a pressure setting between 6 and 14 cmH2O, 2 cm EPR, and a mask of her choice.  Heated humidification will be provided . I will request a ResMed device.    INTERPRETING PHYSICIAN:   Larey Seat, MD   Medical Director of [facility] at [facility].

## 2021-07-13 NOTE — Progress Notes (Signed)
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  There is no associated hypoxia and heart rate variability is in normal range.    RECOMMENDATION:The patient will continue to use CPAP;  her new device will be an auto-titration device with a pressure setting between 6 and 14 cmH2O, 2 cm EPR, and a mask of her choice.  Heated humidification will be provided . I will request a ResMed device.

## 2021-07-16 ENCOUNTER — Other Ambulatory Visit (HOSPITAL_BASED_OUTPATIENT_CLINIC_OR_DEPARTMENT_OTHER): Payer: Self-pay

## 2021-07-16 DIAGNOSIS — M65332 Trigger finger, left middle finger: Secondary | ICD-10-CM | POA: Diagnosis not present

## 2021-07-16 MED ORDER — TRAMADOL HCL 50 MG PO TABS
ORAL_TABLET | ORAL | 0 refills | Status: DC
  Filled 2021-07-16: qty 50, 8d supply, fill #0

## 2021-07-26 ENCOUNTER — Other Ambulatory Visit (HOSPITAL_BASED_OUTPATIENT_CLINIC_OR_DEPARTMENT_OTHER): Payer: Self-pay

## 2021-07-26 MED FILL — Losartan Potassium Tab 100 MG: ORAL | 90 days supply | Qty: 90 | Fill #3 | Status: AC

## 2021-07-27 ENCOUNTER — Other Ambulatory Visit (HOSPITAL_BASED_OUTPATIENT_CLINIC_OR_DEPARTMENT_OTHER): Payer: Self-pay

## 2021-07-27 ENCOUNTER — Other Ambulatory Visit (INDEPENDENT_AMBULATORY_CARE_PROVIDER_SITE_OTHER): Payer: Self-pay | Admitting: Family Medicine

## 2021-07-27 DIAGNOSIS — E1165 Type 2 diabetes mellitus with hyperglycemia: Secondary | ICD-10-CM

## 2021-07-30 ENCOUNTER — Encounter (INDEPENDENT_AMBULATORY_CARE_PROVIDER_SITE_OTHER): Payer: Self-pay | Admitting: Family Medicine

## 2021-07-30 ENCOUNTER — Other Ambulatory Visit (INDEPENDENT_AMBULATORY_CARE_PROVIDER_SITE_OTHER): Payer: Self-pay | Admitting: Bariatrics

## 2021-07-30 ENCOUNTER — Other Ambulatory Visit (HOSPITAL_BASED_OUTPATIENT_CLINIC_OR_DEPARTMENT_OTHER): Payer: Self-pay

## 2021-07-30 DIAGNOSIS — G4733 Obstructive sleep apnea (adult) (pediatric): Secondary | ICD-10-CM | POA: Diagnosis not present

## 2021-07-30 DIAGNOSIS — M25642 Stiffness of left hand, not elsewhere classified: Secondary | ICD-10-CM | POA: Diagnosis not present

## 2021-07-30 DIAGNOSIS — E1165 Type 2 diabetes mellitus with hyperglycemia: Secondary | ICD-10-CM

## 2021-07-30 MED ORDER — OZEMPIC (0.25 OR 0.5 MG/DOSE) 2 MG/1.5ML ~~LOC~~ SOPN
0.5000 mg | PEN_INJECTOR | SUBCUTANEOUS | 0 refills | Status: DC
  Filled 2021-07-30: qty 1.5, 28d supply, fill #0

## 2021-07-30 NOTE — Telephone Encounter (Signed)
LAST APPOINTMENT DATE: 07/09/21 ?NEXT APPOINTMENT DATE: 08/13/21 ? ? ?[facility] ?[address] [place] [address] ?Phone: [phone] Fax: [phone] ? ?[facility] ?[address] ?[place] [address] ?Phone: [phone] Fax: [phone] ? ?[id number] - [place], [place] - [address] ?[address] ?[place]  [address] ?Phone: [phone] Fax: [phone] ? ?Patient is requesting a refill of the following medications: ?No prescriptions requested or ordered in this encounter ? ? ?Date last filled: 06/07/21 ?Previously prescribed by Dr. Jeani Sow ? ?Lab Results ?     Component                Value               Date                 ?     HGBA1C                   6.3 (A)             05/07/2021           ?     HGBA1C                   6.8 (H)             11/13/2020           ?     HGBA1C                   6.0 (H)             05/05/2020           ?Lab Results ?     Component                Value               Date                 ?     [place]                  77                  05/24/2021           ?     CREATININE               0.73                05/24/2021           ?Lab Results ?     Component                Value               Date                 ?     VD25OH                   50.7                05/24/2021           ?     VD25OH                   58.00  11/13/2020           ?     VD25OH                   44.27               10/22/2018           ? ?BP Readings from Last 3 Encounters: ?07/09/21 : 115/69 ?06/25/21 : 125/77 ?06/07/21 : 118/71 ? ?

## 2021-07-30 NOTE — Telephone Encounter (Signed)
Dr.Brown 

## 2021-08-13 ENCOUNTER — Encounter (INDEPENDENT_AMBULATORY_CARE_PROVIDER_SITE_OTHER): Payer: Self-pay | Admitting: Family Medicine

## 2021-08-13 ENCOUNTER — Other Ambulatory Visit (HOSPITAL_BASED_OUTPATIENT_CLINIC_OR_DEPARTMENT_OTHER): Payer: Self-pay

## 2021-08-13 ENCOUNTER — Other Ambulatory Visit: Payer: Self-pay

## 2021-08-13 ENCOUNTER — Ambulatory Visit (INDEPENDENT_AMBULATORY_CARE_PROVIDER_SITE_OTHER): Payer: 59 | Admitting: Family Medicine

## 2021-08-13 VITALS — BP 122/79 | HR 63 | Temp 98.2°F | Ht 66.0 in | Wt 255.0 lb

## 2021-08-13 DIAGNOSIS — I152 Hypertension secondary to endocrine disorders: Secondary | ICD-10-CM | POA: Diagnosis not present

## 2021-08-13 DIAGNOSIS — E669 Obesity, unspecified: Secondary | ICD-10-CM | POA: Diagnosis not present

## 2021-08-13 DIAGNOSIS — Z7985 Long-term (current) use of injectable non-insulin antidiabetic drugs: Secondary | ICD-10-CM

## 2021-08-13 DIAGNOSIS — Z6841 Body Mass Index (BMI) 40.0 and over, adult: Secondary | ICD-10-CM

## 2021-08-13 DIAGNOSIS — E1165 Type 2 diabetes mellitus with hyperglycemia: Secondary | ICD-10-CM | POA: Diagnosis not present

## 2021-08-13 DIAGNOSIS — E1159 Type 2 diabetes mellitus with other circulatory complications: Secondary | ICD-10-CM

## 2021-08-13 MED ORDER — OZEMPIC (0.25 OR 0.5 MG/DOSE) 2 MG/1.5ML ~~LOC~~ SOPN
0.5000 mg | PEN_INJECTOR | SUBCUTANEOUS | 0 refills | Status: DC
  Filled 2021-08-13: qty 1.5, 28d supply, fill #0

## 2021-08-14 NOTE — Progress Notes (Signed)
? ? ? ?Chief Complaint:  ? ?OBESITY ?Alexandra Walker is here to discuss her progress with her obesity treatment plan along with follow-up of her obesity related diagnoses. Alexandra Walker is on keeping a food journal and adhering to recommended goals of 1450-1600 calories and 95+ grams of protein daily and states she is following her eating plan approximately 80-90% of the time. Alexandra Walker states she is doing more walking at 3,000-4,000 steps 3-4 times per week.  ? ?Today's visit was #: 5 ?Starting weight: 265 lbs ?Starting date: 05/24/2021 ?Today's weight: 255 lbs ?Today's date: 08/13/2021 ?Total lbs lost to date: 10 ?Total lbs lost since last in-office visit: 0 ? ?Interim History: Alexandra Walker had hand surgery and has had a month off work. She did some organizing around the house and she went and saw her daughter. She has been doing well with journaling. She journals everyday and she is very aware of her protein intake. She is doing Fair life protein shake sometimes with a scoop of pea protein. ? ?Subjective:  ? ?1. Type 2 diabetes mellitus with hyperglycemia, without long-term current use of insulin ([place]) ?Alexandra Walker is on Ozempic 0.5 mg SubQ weekly, and she denies GI side effects. Last A1c was 6.3. ? ?2. Hypertension associated with diabetes ([place]) ?Alexandra Walker's blood pressure is well controlled. She denies chest pain, chest pressure, or headache. She is on Cozaar, Toprol, and hydrochlorothiazide. ? ?Assessment/Plan:  ? ?1. Type 2 diabetes mellitus with hyperglycemia, without long-term current use of insulin ([name]) ?Chyan will continue Ozempic 0.5 mg, and we will refill for 1 month. ? ?- Semaglutide,0.25 or 0.'5MG'$ /DOS, (OZEMPIC, 0.25 OR 0.5 MG/DOSE,) 2 MG/1.5ML SOPN; Inject 0.5 mg into the skin once a week.  Dispense: 1.5 mL; Refill: 0 ? ?2. Hypertension associated with diabetes ([place]) ?Margeret will continue her current medications, with no change in dose. ? ?3. Obesity with current BMI of 41.3 ?Alexandra Walker is currently in the action stage of change. As such, her  goal is to continue with weight loss efforts. She has agreed to keeping a food journal and adhering to recommended goals of 1450-1600 calories and 95+ grams of protein daily.  ? ?Exercise goals: All adults should avoid inactivity. Some physical activity is better than none, and adults who participate in any amount of physical activity gain some health benefits. ? ?Behavioral modification strategies: increasing lean protein intake, meal planning and cooking strategies, keeping healthy foods in the home, and planning for success. ? ?Alexandra Walker has agreed to follow-up with our clinic in 3 weeks. She was informed of the importance of frequent follow-up visits to maximize her success with intensive lifestyle modifications for her multiple health conditions.  ? ?Objective:  ? ?Blood pressure 122/79, pulse 63, temperature 98.2 ?F (36.8 ?C), height '5\' 6"'$  (1.676 m), weight 255 lb (115.7 kg), last menstrual period 05/20/2008, SpO2 100 %. ?Body mass index is 41.16 kg/m?. ? ?General: Cooperative, alert, well developed, in no acute distress. ?HEENT: Conjunctivae and lids unremarkable. ?Cardiovascular: Regular rhythm.  ?Lungs: Normal work of breathing. ?Neurologic: No focal deficits.  ? ?Lab Results  ?Component Value Date  ? CREATININE 0.73 05/24/2021  ? BUN 17 05/24/2021  ? NA 138 05/24/2021  ? K 3.9 05/24/2021  ? CL 98 05/24/2021  ? CO2 25 05/24/2021  ? ?Lab Results  ?Component Value Date  ? ALT 33 (H) 05/24/2021  ? AST 22 05/24/2021  ? ALKPHOS 86 05/24/2021  ? BILITOT 0.5 05/24/2021  ? ?Lab Results  ?Component Value Date  ? HGBA1C 6.3 (A) 05/07/2021  ?  HGBA1C 6.8 (H) 11/13/2020  ? HGBA1C 6.0 (H) 05/05/2020  ? HGBA1C 5.5 05/05/2020  ? HGBA1C 5.6 11/01/2019  ? ?Lab Results  ?Component Value Date  ? INSULIN 12.5 05/24/2021  ? INSULIN 12.5 03/13/2017  ? INSULIN 10.0 10/28/2016  ? ?Lab Results  ?Component Value Date  ? TSH 1.960 05/24/2021  ? ?Lab Results  ?Component Value Date  ? CHOL 145 05/24/2021  ? HDL 47 05/24/2021  ? [place] 77  05/24/2021  ? LDLDIRECT 118.0 05/24/2019  ? TRIG 114 05/24/2021  ? CHOLHDL 3 11/13/2020  ? ?Lab Results  ?Component Value Date  ? VD25OH 50.7 05/24/2021  ? VD25OH 58.00 11/13/2020  ? VD25OH 44.27 10/22/2018  ? ?Lab Results  ?Component Value Date  ? WBC 5.8 05/24/2021  ? HGB 13.6 05/24/2021  ? HCT 40.4 05/24/2021  ? MCV 84 05/24/2021  ? PLT 225 05/24/2021  ? ?No results found for: IRON, TIBC, FERRITIN ? ?Attestation Statements:  ? ?Reviewed by clinician on day of visit: allergies, medications, problem list, medical history, surgical history, family history, social history, and previous encounter notes. ? ? ?I, Trixie Dredge, am acting as transcriptionist for Coralie Common, MD. ?I have reviewed the above documentation for accuracy and completeness, and I agree with the above. Coralie Common, MD ? ? ?

## 2021-08-17 ENCOUNTER — Encounter (INDEPENDENT_AMBULATORY_CARE_PROVIDER_SITE_OTHER): Payer: Self-pay | Admitting: Family Medicine

## 2021-08-23 NOTE — Telephone Encounter (Signed)
Please advise 

## 2021-08-30 DIAGNOSIS — G4733 Obstructive sleep apnea (adult) (pediatric): Secondary | ICD-10-CM | POA: Diagnosis not present

## 2021-09-03 ENCOUNTER — Other Ambulatory Visit (HOSPITAL_BASED_OUTPATIENT_CLINIC_OR_DEPARTMENT_OTHER): Payer: Self-pay

## 2021-09-03 ENCOUNTER — Encounter (INDEPENDENT_AMBULATORY_CARE_PROVIDER_SITE_OTHER): Payer: Self-pay | Admitting: Family Medicine

## 2021-09-03 ENCOUNTER — Ambulatory Visit (INDEPENDENT_AMBULATORY_CARE_PROVIDER_SITE_OTHER): Payer: 59 | Admitting: Family Medicine

## 2021-09-03 VITALS — BP 101/69 | HR 69 | Temp 98.4°F | Ht 66.0 in | Wt 254.0 lb

## 2021-09-03 DIAGNOSIS — E1165 Type 2 diabetes mellitus with hyperglycemia: Secondary | ICD-10-CM | POA: Diagnosis not present

## 2021-09-03 DIAGNOSIS — Z6841 Body Mass Index (BMI) 40.0 and over, adult: Secondary | ICD-10-CM

## 2021-09-03 DIAGNOSIS — E559 Vitamin D deficiency, unspecified: Secondary | ICD-10-CM

## 2021-09-03 DIAGNOSIS — Z7985 Long-term (current) use of injectable non-insulin antidiabetic drugs: Secondary | ICD-10-CM

## 2021-09-03 DIAGNOSIS — E669 Obesity, unspecified: Secondary | ICD-10-CM

## 2021-09-03 DIAGNOSIS — E1159 Type 2 diabetes mellitus with other circulatory complications: Secondary | ICD-10-CM

## 2021-09-03 DIAGNOSIS — I152 Hypertension secondary to endocrine disorders: Secondary | ICD-10-CM

## 2021-09-03 DIAGNOSIS — Z9189 Other specified personal risk factors, not elsewhere classified: Secondary | ICD-10-CM

## 2021-09-03 MED ORDER — OZEMPIC (0.25 OR 0.5 MG/DOSE) 2 MG/1.5ML ~~LOC~~ SOPN
0.5000 mg | PEN_INJECTOR | SUBCUTANEOUS | 0 refills | Status: DC
  Filled 2021-09-03: qty 1.5, 28d supply, fill #0

## 2021-09-04 ENCOUNTER — Encounter (INDEPENDENT_AMBULATORY_CARE_PROVIDER_SITE_OTHER): Payer: Self-pay | Admitting: Family Medicine

## 2021-09-04 ENCOUNTER — Other Ambulatory Visit (HOSPITAL_BASED_OUTPATIENT_CLINIC_OR_DEPARTMENT_OTHER): Payer: Self-pay

## 2021-09-04 LAB — COMPREHENSIVE METABOLIC PANEL WITH GFR
ALT: 15 IU/L (ref 0–32)
AST: 15 IU/L (ref 0–40)
Albumin/Globulin Ratio: 1.9 (ref 1.2–2.2)
Albumin: 4.1 g/dL (ref 3.8–4.8)
Alkaline Phosphatase: 83 IU/L (ref 44–121)
BUN/Creatinine Ratio: 30 — ABNORMAL HIGH (ref 12–28)
BUN: 21 mg/dL (ref 8–27)
Bilirubin Total: 0.4 mg/dL (ref 0.0–1.2)
CO2: 26 mmol/L (ref 20–29)
Calcium: 9.6 mg/dL (ref 8.7–10.3)
Chloride: 102 mmol/L (ref 96–106)
Creatinine, Ser: 0.71 mg/dL (ref 0.57–1.00)
Globulin, Total: 2.2 g/dL (ref 1.5–4.5)
Glucose: 112 mg/dL — ABNORMAL HIGH (ref 70–99)
Potassium: 4 mmol/L (ref 3.5–5.2)
Sodium: 142 mmol/L (ref 134–144)
Total Protein: 6.3 g/dL (ref 6.0–8.5)
eGFR: 95 mL/min/1.73

## 2021-09-04 LAB — LIPID PANEL WITH LDL/HDL RATIO
Cholesterol, Total: 134 mg/dL (ref 100–199)
HDL: 39 mg/dL — ABNORMAL LOW (ref >39)
LDL Chol Calc (NIH): 70 mg/dL (ref 0–99)
LDL/HDL Ratio: 1.8 ratio (ref 0.0–3.2)
Triglycerides: 141 mg/dL (ref 0–149)
VLDL Cholesterol Cal: 25 mg/dL (ref 5–40)

## 2021-09-04 LAB — VITAMIN D 25 HYDROXY (VIT D DEFICIENCY, FRACTURES): Vit D, 25-Hydroxy: 46.6 ng/mL (ref 30.0–100.0)

## 2021-09-04 LAB — INSULIN, RANDOM: INSULIN: 14.6 u[IU]/mL (ref 2.6–24.9)

## 2021-09-04 LAB — HEMOGLOBIN A1C
Est. average glucose Bld gHb Est-mCnc: 131 mg/dL
Hgb A1c MFr Bld: 6.2 % — ABNORMAL HIGH (ref 4.8–5.6)

## 2021-09-04 NOTE — Telephone Encounter (Signed)
Please advise 

## 2021-09-05 NOTE — Telephone Encounter (Signed)
Please advise 

## 2021-09-15 NOTE — Progress Notes (Signed)
Chief Complaint:   OBESITY Alexandra Walker is here to discuss her progress with her obesity treatment plan along with follow-up of her obesity related diagnoses. Alexandra Walker is on keeping a food journal and adhering to recommended goals of 1450 to 1600 calories and 95 grams of protein and states she is following her eating plan approximately 75% of the time. Alexandra Walker states she is mowing 4 hours 1 time per week.  Today's visit was #: 6 Starting weight: 265 lbs Starting date: 05/24/2021 Today's weight: 254 lbs Today's date: 09/03/2021 Total lbs lost to date: 11 Total lbs lost since last in-office visit: 1  Interim History: Alexandra Walker did return to work since carpal tunnel surgery. She has recognized that she hasn't been packing food as consistently and so she has been trying too choose options with some protein and some fiber. Alexandra Walker is hanging around 1200 to 1300 calories and averaging 50 to 70 grams of protein. She recognizes that work is only an obstacle 3 times per week.  Subjective:   1. Type 2 diabetes mellitus with hyperglycemia, without long-term current use of insulin (HCC) Alexandra Walker is on Ozempic 0.'5mg'$  weekly and denies Gi side effects.  2. Hypertension associated with diabetes (Alexandra Walker) Alexandra Walker's blood pressure is very well controlled today. She denies chest pain, pressure, or headache.  3. Vitamin D deficiency Alexandra Walker is on vitamin D 2,000IU per day. She notes fatigue.  4. At risk for deficient intake of food Alexandra Walker is at risk for deficient food intake due to not eating enough.  Assessment/Plan:   1. Type 2 diabetes mellitus with hyperglycemia, without long-term current use of insulin ([place]) Alexandra Walker agrees to continue taking Ozempic 0.'5mg'$  and will follow up as directed.  - Semaglutide,0.25 or 0.'5MG'$ /DOS, (OZEMPIC, 0.25 OR 0.5 MG/DOSE,) 2 MG/1.5ML SOPN; Inject 0.5 mg into the skin once a week.  Dispense: 1.5 mL; Refill: 0 - Hemoglobin A1c - Insulin, random - Lipid Panel With LDL/HDL Ratio  2.  Hypertension associated with diabetes ([place]) If Alexandra Walker's blood pressure continues to stay well controlled, we may decrease her blood pressure medicines at subsequent visits.  - Comprehensive metabolic panel  3. Vitamin D deficiency We will check Alexandra Walker's vitamin D level today and she agrees to follow up at the agreed upon time.  - VITAMIN D 25 Hydroxy (Vit-D Deficiency, Fractures)  4. At risk for deficient intake of food Alexandra Walker was given approximately 15 minutes of deficient intake of food prevention counseling today. Alexandra Walker is at risk for eating too few calories based on current food recall. She was encouraged to focus on meeting caloric and protein goals according to her recommended meal plan.  5. Obesity with current BMI of 41.0 Alexandra Walker is currently in the action stage of change. As such, her goal is to continue with weight loss efforts. She has agreed to keeping a food journal and adhering to recommended goals of 1450 to 1600 calories and 95+ grams of protein.   Exercise goals: All adults should avoid inactivity. Some physical activity is better than none, and adults who participate in any amount of physical activity gain some health benefits.  Behavioral modification strategies: increasing lean protein intake, meal planning and cooking strategies, keeping healthy foods in the home, and planning for success.  Alexandra Walker has agreed to follow-up with our clinic in 4 weeks. She was informed of the importance of frequent follow-up visits to maximize her success with intensive lifestyle modifications for her multiple health conditions.   Alexandra Walker was informed we would discuss  her lab results at her next visit unless there is a critical issue that needs to be addressed sooner. Alexandra Walker agreed to keep her next visit at the agreed upon time to discuss these results.  Objective:   Blood pressure 101/69, pulse 69, temperature 98.4 F (36.9 C), height '5\' 6"'$  (1.676 m), weight 254 lb (115.2 kg), last menstrual  period 05/20/2008, SpO2 98 %. Body mass index is 41 kg/m.  General: Cooperative, alert, well developed, in no acute distress. HEENT: Conjunctivae and lids unremarkable. Cardiovascular: Regular rhythm.  Lungs: Normal work of breathing. Neurologic: No focal deficits.   Lab Results  Component Value Date   CREATININE 0.71 09/03/2021   BUN 21 09/03/2021   NA 142 09/03/2021   K 4.0 09/03/2021   CL 102 09/03/2021   CO2 26 09/03/2021   Lab Results  Component Value Date   ALT 15 09/03/2021   AST 15 09/03/2021   ALKPHOS 83 09/03/2021   BILITOT 0.4 09/03/2021   Lab Results  Component Value Date   HGBA1C 6.2 (H) 09/03/2021   HGBA1C 6.3 (A) 05/07/2021   HGBA1C 6.8 (H) 11/13/2020   HGBA1C 6.0 (H) 05/05/2020   HGBA1C 5.5 05/05/2020   Lab Results  Component Value Date   INSULIN 14.6 09/03/2021   INSULIN 12.5 05/24/2021   INSULIN 12.5 03/13/2017   INSULIN 10.0 10/28/2016   Lab Results  Component Value Date   TSH 1.960 05/24/2021   Lab Results  Component Value Date   CHOL 134 09/03/2021   HDL 39 (L) 09/03/2021   LDLCALC 70 09/03/2021   LDLDIRECT 118.0 05/24/2019   TRIG 141 09/03/2021   CHOLHDL 3 11/13/2020   Lab Results  Component Value Date   VD25OH 46.6 09/03/2021   VD25OH 50.7 05/24/2021   VD25OH 58.00 11/13/2020   Lab Results  Component Value Date   WBC 5.8 05/24/2021   HGB 13.6 05/24/2021   HCT 40.4 05/24/2021   MCV 84 05/24/2021   PLT 225 05/24/2021   No results found for: IRON, TIBC, FERRITIN  Attestation Statements:   Reviewed by clinician on day of visit: allergies, medications, problem list, medical history, surgical history, family history, social history, and previous encounter notes.  IMarcille Blanco, CMA, am acting as transcriptionist for Coralie Common, MD  I have reviewed the above documentation for accuracy and completeness, and I agree with the above. - Coralie Common, MD

## 2021-09-21 ENCOUNTER — Ambulatory Visit (INDEPENDENT_AMBULATORY_CARE_PROVIDER_SITE_OTHER): Payer: 59 | Admitting: Cardiovascular Disease

## 2021-09-21 ENCOUNTER — Encounter: Payer: Self-pay | Admitting: Cardiovascular Disease

## 2021-09-21 VITALS — BP 118/70 | HR 95 | Ht 66.0 in | Wt 260.4 lb

## 2021-09-21 DIAGNOSIS — I10 Essential (primary) hypertension: Secondary | ICD-10-CM | POA: Diagnosis not present

## 2021-09-21 DIAGNOSIS — I471 Supraventricular tachycardia: Secondary | ICD-10-CM

## 2021-09-21 NOTE — Patient Instructions (Signed)
Medication Instructions:  No changes *If you need a refill on your cardiac medications before your next appointment, please call your pharmacy*   Lab Work: none If you have labs (blood work) drawn today and your tests are completely normal, you will receive your results only by: MyChart Message (if you have MyChart) OR A paper copy in the mail If you have any lab test that is abnormal or we need to change your treatment, we will call you to review the results.   Testing/Procedures: none   Follow-Up: At [facility], you and your health needs are our priority.  As part of our continuing mission to provide you with exceptional heart care, we have created designated Provider Care Teams.  These Care Teams include your primary Cardiologist (physician) and Advanced Practice Providers (APPs -  Physician Assistants and Nurse Practitioners) who all work together to provide you with the care you need, when you need it.   Your next appointment:   12 month(s)  The format for your next appointment:   In Person  Provider:   Christopher McAlhany, MD     Other Instructions   Important Information About Sugar       

## 2021-09-21 NOTE — Progress Notes (Signed)
? ?Chief Complaint  ?Patient presents with  ? Follow-up  ?  SVT  ? ?History of Present Illness: 66 yo female with history of HTN, HLD, SVT, PACs and irritable bowel syndrome who is here today for cardiac follow up. 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 I saw her as a new patient May 2013 to establish cardiology care. She has been followed in [facility] with Dr. Linus Salmons. She has had several stress tests that were normal with negative stress echo in 2003, 2005 and a normal stress myoview in 2009. She has had chronic chest pain as well as irritable bowel syndrome. She has history of PACs that are controlled with Toprol. She also has esophagitis and had an esophageal dilatation 2014. She was noted to have atrial fib/SVT when admitted with urosepsis in 2017. Cardiac event monitor in 2017 with no evidence of atrial fibrillation or SVT. Coronary CTA in 2017 showed no evidence of CAD with calcium score of zero. Echo May 2017 with normal LV size and function. No valve disease.  ? ?She is here today for follow up. The patient denies any chest pain, dyspnea, palpitations, lower extremity edema, orthopnea, PND, dizziness, near syncope or syncope.  ? ?Primary Care Physician: Leamon Arnt, MD ? ?Past Medical History:  ?Diagnosis Date  ? ADD (attention deficit disorder)   ? Allergy   ? 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 Bilateral swelling of feet   ? Cholelithiasis   ? Chronic depressive disorder   ? Circadian rhythm sleep disorder, shift work   ? 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 Diabetes ([place])   ? Diverticulosis   ? Minimal  ? GERD (gastroesophageal reflux disease)   ? Hepatic steatosis   ? History of hiatal hernia 2017  ? Small  ? HLA B27 (HLA B27 positive)   ? Hx of pancreatitis   ? Hyperlipidemia   ? Hypertension   ? IBS (irritable bowel syndrome)   ? Lactose intolerance   ? Lumbar scoliosis 2010  ? Mild  ? Migraine headache without aura 05/10/2015  ? Nephrolithiasis 2014  ? Left,  minimal  ? Obesity due to excess calories with serious comorbidity 05/24/2019  ? Palpitations   ? Prediabetes   ? Presbyopia   ? Right bundle branch block   ? Sepsis ([place]) 01/2016  ? Sleep hypopnea   ? Vitamin D deficiency   ? ? ?Past Surgical History:  ?Procedure Laterality Date  ? BLADDER SURGERY    ? BREAST BIOPSY Left   ? CERVICAL CONE BIOPSY    ? CHOLECYSTECTOMY N/A 10/26/2014  ? Procedure: LAPAROSCOPIC CHOLECYSTECTOMY;  Surgeon: Coralie Keens, MD;  Location: [facility];  Service: General;  Laterality: N/A;  ? COLONOSCOPY W/ BIOPSIES  multiple  ? CYSTOSCOPY WITH RETROGRADE PYELOGRAM, URETEROSCOPY AND STENT PLACEMENT Left 06/26/2018  ? Procedure: CYSTOSCOPY WITH RETROGRADE PYELOGRAM, URETEROSCOPY AND STENT PLACEMENT;  Surgeon: Alexis Frock, MD;  Location: [facility];  Service: Urology;  Laterality: Left;  ? DIAGNOSTIC LAPAROSCOPY    ? DILATION AND CURETTAGE OF UTERUS  11/2009  ? DUPUYTREN CONTRACTURE RELEASE    ? ESOPHAGOGASTRODUODENOSCOPY  multiple  ? EYE SURGERY    ? HOLMIUM LASER APPLICATION Left 47/82/9562  ? Procedure: HOLMIUM LASER APPLICATION;  Surgeon: Alexis Frock, MD;  Location: [facility];  Service: Urology;  Laterality: Left;  ? KNEE ARTHROSCOPY Left 11/2008  ? LAPAROSCOPIC VAGINAL HYSTERECTOMY WITH SALPINGO OOPHORECTOMY Bilateral 10/03/2015  ? Procedure: LAPAROSCOPIC  ASSISTED VAGINAL HYSTERECTOMY WITH SALPINGECTOMY;  Surgeon: Eldred Manges, MD;  Location: [facility];  Service: Gynecology;  Laterality: Bilateral;  ? LASIK  1990  ? RK  ? LEFT FINGER SURGERY    ? LIVER BIOPSY    ? MASS EXCISION Left 12/24/2012  ? Procedure: LEFT EXCISION OF PALMAR MASS X TWO;  Surgeon: Roseanne Kaufman, MD;  Location: [facility];  Service: Orthopedics;  Laterality: Left;  ? [place]  ? [place]  ? ? ?Current Outpatient Medications  ?Medication Sig Dispense Refill  ? amphetamine-dextroamphetamine (ADDERALL) 10 MG  tablet Take 1 tablet (10 mg total) by mouth daily as needed. 30 tablet 0  ? aspirin 325 MG tablet Take 325 mg by mouth daily.    ? cholecalciferol (VITAMIN D) 1000 UNITS tablet Take 2,000 Units by mouth daily.     ? diclofenac sodium (VOLTAREN) 1 % GEL APPLY 2 GRAMS TOPICALLY 2 TIMES DAILY AS NEEDED. NOTE: 2 GRAMS IS EQUIVALENT TO A NICKEL SIZED AMOUNT.    ? hydrochlorothiazide (HYDRODIURIL) 25 MG tablet Take 1 tablet (25 mg total) by mouth daily. 90 tablet 3  ? losartan (COZAAR) 100 MG tablet TAKE 1 TABLET (100 MG TOTAL) BY MOUTH DAILY. 90 tablet 3  ? metoprolol succinate (TOPROL-XL) 25 MG 24 hr tablet Take 1 tablet (25 mg total) by mouth daily. Take with 75m dose for a total of 741mdaily 90 tablet 3  ? metoprolol succinate (TOPROL-XL) 50 MG 24 hr tablet TAKE 1 TABLET BY MOUTH DAILY WITH OR IMMEDIATELY FOLLOWING A MEAL 90 tablet 3  ? Omega-3 1000 MG CAPS Take 1 capsule by mouth daily.     ? rosuvastatin (CRESTOR) 10 MG tablet TAKE 1 TABLET (10 MG TOTAL) BY MOUTH DAILY. 90 tablet 3  ? Semaglutide,0.25 or 0.5MG/DOS, (OZEMPIC, 0.25 OR 0.5 MG/DOSE,) 2 MG/1.5ML SOPN Inject 0.5 mg into the skin once a week. 1.5 mL 0  ? Turmeric 450 MG CAPS Take 450 mg by mouth daily.     ? ?No current facility-administered medications for this visit.  ? ? ?Allergies  ?Allergen Reactions  ? Ace Inhibitors Other (See Comments)  ?  coughing  ? Oxycodone-Acetaminophen Nausea And Vomiting  ? Vicodin [Hydrocodone-Acetaminophen] Nausea And Vomiting  ? Victoza [Liraglutide] Other (See Comments)  ?  pancreatitis  ? Adhesive [Tape] Rash  ?  Not on stomach  ? ? ?Social History  ? ?Socioeconomic History  ? Marital status: Divorced  ?  Spouse name: Not on file  ? Number of children: 1  ? Years of education: Not on file  ? Highest education level: Not on file  ?Occupational History  ? 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  Employer: [employer]  ?Tobacco Use  ? Smoking status: Never  ? Smokeless tobacco: Never  ?Vaping Use  ? Vaping Use: Never  used  ?Substance and Sexual Activity  ? Alcohol use: Yes  ?  Alcohol/week: 0.0 standard drinks  ?  Comment: social  ? Drug use: No  ? Sexual activity: Yes  ?  Birth control/protection: Surgical  ?Other Topics Concern  ? Not on file  ?Social History Narrative  ? 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  ? ?Social Determinants of Health  ? ?Financial Resource Strain: Not on file  ?Food Insecurity: Not on file  ?Transportation Needs: Not on file  ?Physical Activity: Not on file  ?Stress: Not on file  ?Social Connections: Not on file  ?Intimate Partner  Violence: Not on file  ? ? ?Family History  ?Problem Relation Age of Onset  ? Coronary artery disease Mother   ? Hypertension Mother   ? Heart disease Mother   ? Heart attack Mother   ? Diabetes Mother   ? Hyperlipidemia Mother   ? Kidney disease Mother   ? Depression Mother   ? Sleep apnea Mother   ? Obesity Mother   ? Heart attack Father   ? Heart disease Father   ? Sudden death Father   ? Hypertension Sister   ? Hypertension Brother   ? Colon cancer Neg Hx   ? ? ?Review of Systems:  As stated in the HPI and otherwise negative.  ? ?BP 118/70   Pulse 95   Ht '5\' 6"'  (1.676 m)   Wt 260 lb 6.4 oz (118.1 kg)   LMP 05/20/2008   SpO2 99%   BMI 42.03 kg/m?  ? ?Physical Examination: ?General: Well developed, well nourished, NAD  ?HEENT: OP clear, mucus membranes moist  ?SKIN: warm, dry. No rashes. ?Neuro: No focal deficits  ?Musculoskeletal: Muscle strength 5/5 all ext  ?Psychiatric: Mood and affect normal  ?Neck: No JVD, no carotid bruits, no thyromegaly, no lymphadenopathy.  ?Lungs:Clear bilaterally, no wheezes, rhonci, crackles ?Cardiovascular: Regular rate and rhythm. No murmurs, gallops or rubs. ?Abdomen:Soft. Bowel sounds present. Non-tender.  ?Extremities: No lower extremity edema. Pulses are 2 + in the bilateral DP/PT. ? ? ?Echo May 2017: ?Left ventricle: The cavity size was normal. Systolic function was ?  normal. The estimated ejection fraction was in  the range of 60% ?  to 65%. Wall motion was normal; there were no regional wall ?  motion abnormalities. Left ventricular diastolic function ?  parameters were normal. ?- Left atrium: The atrium was mildly dilated.

## 2021-09-25 ENCOUNTER — Other Ambulatory Visit (HOSPITAL_BASED_OUTPATIENT_CLINIC_OR_DEPARTMENT_OTHER): Payer: Self-pay

## 2021-09-25 ENCOUNTER — Other Ambulatory Visit: Payer: Self-pay | Admitting: Family Medicine

## 2021-09-27 ENCOUNTER — Other Ambulatory Visit (HOSPITAL_BASED_OUTPATIENT_CLINIC_OR_DEPARTMENT_OTHER): Payer: Self-pay

## 2021-09-27 MED ORDER — HYDROCHLOROTHIAZIDE 25 MG PO TABS
25.0000 mg | ORAL_TABLET | Freq: Every day | ORAL | 3 refills | Status: DC
  Filled 2021-09-27: qty 90, 90d supply, fill #0
  Filled 2021-12-11: qty 90, 90d supply, fill #1
  Filled 2021-12-21 – 2022-02-26 (×2): qty 90, 90d supply, fill #2
  Filled 2022-04-22 – 2022-05-27 (×3): qty 90, 90d supply, fill #3

## 2021-09-29 DIAGNOSIS — G4733 Obstructive sleep apnea (adult) (pediatric): Secondary | ICD-10-CM | POA: Diagnosis not present

## 2021-10-01 ENCOUNTER — Other Ambulatory Visit (INDEPENDENT_AMBULATORY_CARE_PROVIDER_SITE_OTHER): Payer: Self-pay | Admitting: Family Medicine

## 2021-10-01 ENCOUNTER — Other Ambulatory Visit (HOSPITAL_BASED_OUTPATIENT_CLINIC_OR_DEPARTMENT_OTHER): Payer: Self-pay

## 2021-10-01 ENCOUNTER — Ambulatory Visit (INDEPENDENT_AMBULATORY_CARE_PROVIDER_SITE_OTHER): Payer: 59 | Admitting: Family Medicine

## 2021-10-01 ENCOUNTER — Ambulatory Visit: Payer: 59 | Admitting: Adult Health

## 2021-10-01 ENCOUNTER — Encounter: Payer: Self-pay | Admitting: Adult Health

## 2021-10-01 ENCOUNTER — Encounter (INDEPENDENT_AMBULATORY_CARE_PROVIDER_SITE_OTHER): Payer: Self-pay | Admitting: Family Medicine

## 2021-10-01 VITALS — BP 99/61 | HR 71 | Ht 66.0 in | Wt 258.0 lb

## 2021-10-01 VITALS — BP 103/68 | HR 70 | Temp 98.4°F | Ht 66.0 in | Wt 254.0 lb

## 2021-10-01 DIAGNOSIS — E1165 Type 2 diabetes mellitus with hyperglycemia: Secondary | ICD-10-CM

## 2021-10-01 DIAGNOSIS — Z9989 Dependence on other enabling machines and devices: Secondary | ICD-10-CM

## 2021-10-01 DIAGNOSIS — E1159 Type 2 diabetes mellitus with other circulatory complications: Secondary | ICD-10-CM

## 2021-10-01 DIAGNOSIS — E669 Obesity, unspecified: Secondary | ICD-10-CM

## 2021-10-01 DIAGNOSIS — G4733 Obstructive sleep apnea (adult) (pediatric): Secondary | ICD-10-CM

## 2021-10-01 DIAGNOSIS — Z6841 Body Mass Index (BMI) 40.0 and over, adult: Secondary | ICD-10-CM

## 2021-10-01 DIAGNOSIS — Z7985 Long-term (current) use of injectable non-insulin antidiabetic drugs: Secondary | ICD-10-CM | POA: Diagnosis not present

## 2021-10-01 DIAGNOSIS — I152 Hypertension secondary to endocrine disorders: Secondary | ICD-10-CM | POA: Diagnosis not present

## 2021-10-01 DIAGNOSIS — Z9189 Other specified personal risk factors, not elsewhere classified: Secondary | ICD-10-CM

## 2021-10-01 MED ORDER — OZEMPIC (0.25 OR 0.5 MG/DOSE) 2 MG/3ML ~~LOC~~ SOPN
0.5000 mg | PEN_INJECTOR | SUBCUTANEOUS | 0 refills | Status: DC
  Filled 2021-10-01: qty 3, 28d supply, fill #0

## 2021-10-01 NOTE — Patient Instructions (Addendum)
Your Plan: ? ?Continue current use of CPAP - no changes at this time  ? ? ? ? ? ? ?Thank you for coming to see [name] at [facility]. I hope we have been able to provide you high quality care today. ? ?You may receive a patient satisfaction survey over the next few weeks. We would appreciate your feedback and comments so that we may continue to improve ourselves and the health of our patients. ? ?

## 2021-10-01 NOTE — Progress Notes (Signed)
?[facility] ?[address] ?[place]. [place] [address] ?[phone] ? ?     OFFICE FOLLOW UP NOTE ? ?[name] ?Date of Birth:  12/11/1955 ?Medical Record Number:  [id number]  ? ?Reason for visit: CPAP follow-up ? ? ? ?SUBJECTIVE: ? ? ?CHIEF COMPLAINT:  ?Chief Complaint  ?Patient presents with  ? Follow-up  ?  Rm 3 alone here for f/u on CPAP- reports she has been doing well. Feels like the water in her resviour is empty quicker than her previous machine.   ? ? ?HPI:  ? ?Update 10/01/2021 JM: patient returns for CPAP follow up.  She was previously seen by Dr. Brett Walker 6 months ago.  She received a new CPAP machine on 3/13 after completion of HST on 2/6 which confirmed sleep apnea.  Review of compliance report over the past 30 days shows 30 out of 30 usage days with 26 days greater than 4 hours for 87% compliance.  Average usage 6 hours and 33 minutes.  Residual AHI 0.1.  Leaks in the 95th percentile 1.0.  Pressure in the 95th percentile 7.4.  Continues to do well on AutoPap 6-14 with EPR 2. She requests adjustment to ramp pressure/time as she prefers not to have a longer ramp time.  She does note her water chamber draining quicker than before. She will have occasional leaks when turning on sides. She is otherwise tolerating well.  Continues to work night shifts.  Epworth Sleepiness Scale 7/24.  Fatigue severity scale 34/63.  No further concerns at this time. ? ? ? ? ?History provided for reference purposes only ?04-09-2021 Dr. Brett Walker, [place] for my established sleep patient, Alexandra Walker.  ?She contracted Covid in July and was feeling pretty back to baseline within less than a week. She is here because her CPAP machine is going to be 64 years old now and will be replaced, I will order a new HST baseline.  ?She was doing well at 8 cm water, 2 cm EPR. ?Her daughter is moving as an P OT to a [place] service in [place], she is looking forward to an empty nest  ? ?  ?08-07-2020 Dr. Brett Walker,  Interval history for Alexandra Walker, Nurse Midwife, ?She has the usual lower compliance based on her on-call history-  She is not sleepy while working nights.  ?Has a cumulative sleep deficit at the week end , making up by sleeping `12 hours off call- live style is a 4 day weekend.  ?I have the pleasure of also see that her CPAP which is still set at 8 cmH2O with 3 cm EPR has completely resolved her apnea her residual AHI is again excellent at 0.2/h.  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   ?So I do not need to make any adjustments her air sense AutoSet machine however will be soon 64 years old I usually check on a home sleep test just before I order a new machine and that will be next year.   ?She has endorsed the fatigue severity score at 31 points and the Epworth sleepiness score at 4 points, she endorsed 5 out of 15 points on a depression score. ?  ?New machine due in 2023.  ? ? ? ? ? ?ROS:   ?14 system review of systems performed and negative with  exception of those listed in HPI ? ?PMH:  ?Past Medical History:  ?Diagnosis Date  ? ADD (attention deficit disorder)   ? Allergy   ? 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)   ? Bilateral swelling of feet   ? Cholelithiasis   ? Chronic depressive disorder   ? Circadian rhythm sleep disorder, shift work   ? 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 Diabetes ([place])   ? Diverticulosis   ? Minimal  ? GERD (gastroesophageal reflux disease)   ? Hepatic steatosis   ? History of hiatal hernia 2017  ? Small  ? HLA B27 (HLA B27 positive)   ? Hx of pancreatitis   ? Hyperlipidemia   ? Hypertension   ? IBS (irritable bowel syndrome)   ? Lactose intolerance   ? Lumbar scoliosis 2010  ? Mild  ? Migraine headache without aura 05/10/2015  ? Nephrolithiasis 2014  ? Left, minimal  ? Obesity due to excess calories with serious  comorbidity 05/24/2019  ? Palpitations   ? Prediabetes   ? Presbyopia   ? Right bundle branch block   ? Sepsis ([place]) 01/2016  ? Sleep hypopnea   ? Vitamin D deficiency   ? ? ?PSH:  ?Past Surgical History:  ?Procedure Laterality Date  ? BLADDER SURGERY    ? BREAST BIOPSY Left   ? CERVICAL CONE BIOPSY    ? CHOLECYSTECTOMY N/A 10/26/2014  ? Procedure: LAPAROSCOPIC CHOLECYSTECTOMY;  Surgeon: Coralie Keens, MD;  Location: [facility];  Service: General;  Laterality: N/A;  ? COLONOSCOPY W/ BIOPSIES  multiple  ? CYSTOSCOPY WITH RETROGRADE PYELOGRAM, URETEROSCOPY AND STENT PLACEMENT Left 06/26/2018  ? Procedure: CYSTOSCOPY WITH RETROGRADE PYELOGRAM, URETEROSCOPY AND STENT PLACEMENT;  Surgeon: Alexis Frock, MD;  Location: [facility];  Service: Urology;  Laterality: Left;  ? DIAGNOSTIC LAPAROSCOPY    ? DILATION AND CURETTAGE OF UTERUS  11/2009  ? DUPUYTREN CONTRACTURE RELEASE    ? ESOPHAGOGASTRODUODENOSCOPY  multiple  ? EYE SURGERY    ? HOLMIUM LASER APPLICATION Left [id number]  ? Procedure: HOLMIUM LASER APPLICATION;  Surgeon: Alexis Frock, MD;  Location: [facility];  Service: Urology;  Laterality: Left;  ? KNEE ARTHROSCOPY Left 11/2008  ? LAPAROSCOPIC VAGINAL HYSTERECTOMY WITH SALPINGO OOPHORECTOMY Bilateral 10/03/2015  ? Procedure: LAPAROSCOPIC ASSISTED VAGINAL HYSTERECTOMY WITH SALPINGECTOMY;  Surgeon: Eldred Manges, MD;  Location: [facility];  Service: Gynecology;  Laterality: Bilateral;  ? LASIK  1990  ? RK  ? LEFT FINGER SURGERY    ? LIVER BIOPSY    ? MASS EXCISION Left 12/24/2012  ? Procedure: LEFT EXCISION OF PALMAR MASS X TWO;  Surgeon: Roseanne Kaufman, MD;  Location: [facility];  Service: Orthopedics;  Laterality: Left;  ? [place]  ? [place]  ? ? ?Social History:  ?Social History  ? ?Socioeconomic History  ? Marital status: Divorced  ?  Spouse name: Not on file  ? Number of children: 1  ? Years of  education: Not on file  ? Highest education level: Not on file  ?Occupational History  ? 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  Employer: [employer]  ?Tobacco Use  ? Smoking status: Never  ? Smokeless tobacco: Never  ?Vaping Use  ? Vaping Use: Never used  ?Substance and Sexual Activity  ? Alcohol use: Yes  ?  Alcohol/week: 0.0 standard drinks  ?  Comment: social  ? Drug use: No  ? Sexual activity: Yes  ?  Birth  control/protection: Surgical  ?Other Topics Concern  ? Not on file  ?Social History Narrative  ? 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  ? ?Social Determinants of Health  ? ?Financial Resource Strain: Not on file  ?Food Insecurity: Not on file  ?Transportation Needs: Not on file  ?Physical Activity: Not on file  ?Stress: Not on file  ?Social Connections: Not on file  ?Intimate Partner Violence: Not on file  ? ? ?Family History:  ?Family History  ?Problem Relation Age of Onset  ? Coronary artery disease Mother   ? Hypertension Mother   ? Heart disease Mother   ? Heart attack Mother   ? Diabetes Mother   ? Hyperlipidemia Mother   ? Kidney disease Mother   ? Depression Mother   ? Sleep apnea Mother   ? Obesity Mother   ? Heart attack Father   ? Heart disease Father   ? Sudden death Father   ? Hypertension Sister   ? Hypertension Brother   ? Colon cancer Neg Hx   ? ? ?Medications:   ?Current Outpatient Medications on File Prior to Visit  ?Medication Sig Dispense Refill  ? amphetamine-dextroamphetamine (ADDERALL) 10 MG tablet Take 1 tablet (10 mg total) by mouth daily as needed. 30 tablet 0  ? aspirin 325 MG tablet Take 325 mg by mouth daily.    ? cholecalciferol (VITAMIN D) 1000 UNITS tablet Take 2,000 Units by mouth daily.     ? diclofenac sodium (VOLTAREN) 1 % GEL APPLY 2 GRAMS TOPICALLY 2 TIMES DAILY AS NEEDED. NOTE: 2 GRAMS IS EQUIVALENT TO A NICKEL SIZED AMOUNT.    ? hydrochlorothiazide (HYDRODIURIL) 25 MG tablet Take 1 tablet (25 mg total) by mouth daily. 90 tablet 3  ? losartan (COZAAR)  100 MG tablet TAKE 1 TABLET (100 MG TOTAL) BY MOUTH DAILY. 90 tablet 3  ? metoprolol succinate (TOPROL-XL) 25 MG 24 hr tablet Take 1 tablet (25 mg total) by mouth daily. Take with 55m dose for a total of 728mdaily 90 tablet

## 2021-10-03 NOTE — Progress Notes (Signed)
Chief Complaint:   OBESITY Alexandra Walker is here to discuss her progress with her obesity treatment plan along with follow-up of her obesity related diagnoses. Alexandra Walker is on keeping a food journal and adhering to recommended goals of 1450-1600 calories and 95 plus grams of protein and states she is following her eating plan approximately 70% of the time. Alexandra Walker states she is walking and push mower for 30-40 minutes 3 times per week.  Today's visit was #: 7 Starting weight: 265 lbs Starting date: 05/24/2021 Today's weight: 254 lbs Today's date: 10/01/2021 Total lbs lost to date: 11 lbs Total lbs lost since last in-office visit: 0  Interim History: Alexandra Walker averages 1500 calories per day and probably 70 grams of protein daily. She does have 40 grams of [employer] Shakes now. Her work has been particularly busy so she has been often not eating as much as she should. She didn't care much for high protein snacks. She is feeling tired with the idea of food prep. She is not feeling hungry.   Subjective:   1. Hypertension associated with diabetes [facility]) Alexandra Walker is on HCTZ, Toprol, and Cozaar currently. She denies chest pain, chest pressure, and headaches.   2. Type 2 diabetes mellitus with hyperglycemia, without long-term current use of insulin ([place]) Alexandra Walker denies hunger, gastrointestinal side effects. Her last A1C was 6.2. She is on Ozempic 0.5 mg subcutaneous.   3. At risk for deficient intake of food Alexandra Walker is at risk for deficient intake of food due to often skipping meals at work.    Assessment/Plan:   1. Hypertension associated with diabetes [facility]) Alexandra Walker recently increased Toprol. We will follow up with blood pressure at next appointment. She is working on healthy weight loss and exercise to improve blood pressure control. We will watch for signs of hypotension as she continues her lifestyle modifications.  2. Type 2 diabetes mellitus with hyperglycemia, without long-term current use of insulin  ([facility]) We will refill Ozempic 0.5 mg subcutaneous weekly for 1 month with no refills. Good blood sugar control is important to decrease the likelihood of diabetic complications such as nephropathy, neuropathy, limb loss, blindness, coronary artery disease, and death. Intensive lifestyle modification including diet, exercise and weight loss are the first line of treatment for diabetes.   - Semaglutide,0.25 or 0.'5MG'$ /DOS, (OZEMPIC, 0.25 OR 0.5 MG/DOSE,) 2 MG/3ML SOPN; Inject 0.5 mg into the skin once a week.  Dispense: 3 mL; Refill: 0  3. At risk for deficient intake of food Alexandra Walker was given approximately 15 minutes of deficient intake of food prevention counseling today. Alexandra Walker is at risk for eating too few calories based on current food recall. She was encouraged to focus on meeting caloric and protein goals according to her recommended meal plan.   4. Obesity with current BMI of 41.1 Alexandra Walker is currently in the action stage of change. As such, her goal is to continue with weight loss efforts. She has agreed to keeping a food journal and adhering to recommended goals of 1450-1600 calories and 95 plus grams of protein daily.    Exercise goals: All adults should avoid inactivity. Some physical activity is better than none, and adults who participate in any amount of physical activity gain some health benefits.  Behavioral modification strategies: increasing lean protein intake, no skipping meals, meal planning and cooking strategies, keeping healthy foods in the home, planning for success, and keeping a strict food journal.  Alexandra Walker has agreed to follow-up with our clinic in 3 weeks.  She was informed of the importance of frequent follow-up visits to maximize her success with intensive lifestyle modifications for her multiple health conditions.   Objective:   Blood pressure 103/68, pulse 70, temperature 98.4 F (36.9 C), height '5\' 6"'$  (1.676 m), weight 254 lb (115.2 kg), last menstrual period 05/20/2008,  SpO2 97 %. Body mass index is 41 kg/m.  General: Cooperative, alert, well developed, in no acute distress. HEENT: Conjunctivae and lids unremarkable. Cardiovascular: Regular rhythm.  Lungs: Normal work of breathing. Neurologic: No focal deficits.   Lab Results  Component Value Date   CREATININE 0.71 09/03/2021   BUN 21 09/03/2021   NA 142 09/03/2021   K 4.0 09/03/2021   CL 102 09/03/2021   CO2 26 09/03/2021   Lab Results  Component Value Date   ALT 15 09/03/2021   AST 15 09/03/2021   ALKPHOS 83 09/03/2021   BILITOT 0.4 09/03/2021   Lab Results  Component Value Date   HGBA1C 6.2 (H) 09/03/2021   HGBA1C 6.3 (A) 05/07/2021   HGBA1C 6.8 (H) 11/13/2020   HGBA1C 6.0 (H) 05/05/2020   HGBA1C 5.5 05/05/2020   Lab Results  Component Value Date   INSULIN 14.6 09/03/2021   INSULIN 12.5 05/24/2021   INSULIN 12.5 03/13/2017   INSULIN 10.0 10/28/2016   Lab Results  Component Value Date   TSH 1.960 05/24/2021   Lab Results  Component Value Date   CHOL 134 09/03/2021   HDL 39 (L) 09/03/2021   LDLCALC 70 09/03/2021   LDLDIRECT 118.0 05/24/2019   TRIG 141 09/03/2021   CHOLHDL 3 11/13/2020   Lab Results  Component Value Date   VD25OH 46.6 09/03/2021   VD25OH 50.7 05/24/2021   VD25OH 58.00 11/13/2020   Lab Results  Component Value Date   WBC 5.8 05/24/2021   HGB 13.6 05/24/2021   HCT 40.4 05/24/2021   MCV 84 05/24/2021   PLT 225 05/24/2021   No results found for: IRON, TIBC, FERRITIN  Attestation Statements:   Reviewed by clinician on day of visit: allergies, medications, problem list, medical history, surgical history, family history, social history, and previous encounter notes.  I, Lizbeth Bark, RMA, am acting as transcriptionist for Coralie Common, MD.   I have reviewed the above documentation for accuracy and completeness, and I agree with the above. - Coralie Common, MD

## 2021-10-19 ENCOUNTER — Other Ambulatory Visit: Payer: Self-pay | Admitting: Family Medicine

## 2021-10-19 ENCOUNTER — Other Ambulatory Visit (HOSPITAL_BASED_OUTPATIENT_CLINIC_OR_DEPARTMENT_OTHER): Payer: Self-pay

## 2021-10-19 DIAGNOSIS — D485 Neoplasm of uncertain behavior of skin: Secondary | ICD-10-CM | POA: Diagnosis not present

## 2021-10-19 DIAGNOSIS — L439 Lichen planus, unspecified: Secondary | ICD-10-CM | POA: Diagnosis not present

## 2021-10-19 MED ORDER — LOSARTAN POTASSIUM 100 MG PO TABS
ORAL_TABLET | Freq: Every day | ORAL | 3 refills | Status: DC
  Filled 2021-10-19: qty 90, 90d supply, fill #0
  Filled 2021-12-11 – 2022-01-17 (×3): qty 90, 90d supply, fill #1
  Filled 2022-02-26 – 2022-04-22 (×2): qty 90, 90d supply, fill #2
  Filled 2022-05-22 – 2022-08-04 (×2): qty 90, 90d supply, fill #3

## 2021-10-22 ENCOUNTER — Other Ambulatory Visit (HOSPITAL_BASED_OUTPATIENT_CLINIC_OR_DEPARTMENT_OTHER): Payer: Self-pay

## 2021-10-22 ENCOUNTER — Ambulatory Visit (INDEPENDENT_AMBULATORY_CARE_PROVIDER_SITE_OTHER): Payer: 59 | Admitting: Family Medicine

## 2021-10-22 ENCOUNTER — Encounter (INDEPENDENT_AMBULATORY_CARE_PROVIDER_SITE_OTHER): Payer: Self-pay | Admitting: Family Medicine

## 2021-10-22 VITALS — BP 110/66 | HR 72 | Temp 98.4°F | Ht 66.0 in | Wt 250.0 lb

## 2021-10-22 DIAGNOSIS — Z7985 Long-term (current) use of injectable non-insulin antidiabetic drugs: Secondary | ICD-10-CM | POA: Diagnosis not present

## 2021-10-22 DIAGNOSIS — Z6841 Body Mass Index (BMI) 40.0 and over, adult: Secondary | ICD-10-CM | POA: Diagnosis not present

## 2021-10-22 DIAGNOSIS — E1165 Type 2 diabetes mellitus with hyperglycemia: Secondary | ICD-10-CM | POA: Diagnosis not present

## 2021-10-22 DIAGNOSIS — E785 Hyperlipidemia, unspecified: Secondary | ICD-10-CM

## 2021-10-22 DIAGNOSIS — E669 Obesity, unspecified: Secondary | ICD-10-CM | POA: Diagnosis not present

## 2021-10-22 DIAGNOSIS — E1169 Type 2 diabetes mellitus with other specified complication: Secondary | ICD-10-CM | POA: Diagnosis not present

## 2021-10-22 DIAGNOSIS — Z9189 Other specified personal risk factors, not elsewhere classified: Secondary | ICD-10-CM

## 2021-10-22 MED ORDER — OZEMPIC (0.25 OR 0.5 MG/DOSE) 2 MG/3ML ~~LOC~~ SOPN
0.5000 mg | PEN_INJECTOR | SUBCUTANEOUS | 0 refills | Status: DC
  Filled 2021-10-22 – 2021-10-30 (×3): qty 3, 28d supply, fill #0

## 2021-10-23 ENCOUNTER — Other Ambulatory Visit (HOSPITAL_BASED_OUTPATIENT_CLINIC_OR_DEPARTMENT_OTHER): Payer: Self-pay

## 2021-10-25 NOTE — Progress Notes (Unsigned)
Chief Complaint:   OBESITY Alexandra Walker is here to discuss her progress with her obesity treatment plan along with follow-up of her obesity related diagnoses. Alexandra Walker is on keeping a food journal and adhering to recommended goals of 1450-1600 calories and 95 grams of protein and states she is following her eating plan approximately 100% of the time. Alexandra Walker states she is doing yard work 30-40 minutes 2 times per week.  Today's visit was #: 8 Starting weight: 265 lbs Starting date: 05/24/2021 Today's weight: 250 lbs Today's date: 10/22/2021 Total lbs lost to date: 15 lbs Total lbs lost since last in-office visit: 4  Interim History: Alexandra Walker has logged consistently since last appointment. She has had a few days of eating more carbs in comparison to protein for calorie allotment. She's been much more mindful of really increasing protein content. She has had IBS type symptoms over weekend; was still eating even with symptoms. She's able to really understand the ratio calories to protein.  Subjective:   1. Hyperlipidemia associated with type 2 diabetes mellitus (Alexandra Walker) Alexandra Walker's blood pressure is very well controlled today. She is currently taking Cozaar and Toprol. Denies chest pain, chest pressure and headache.  2. Type 2 diabetes mellitus with hyperglycemia, without long-term current use of insulin (Alexandra Walker) Alexandra Walker has been logging consistently calories and protein. She is doing well on Ozempic.  3. At risk for deficient intake of food The patient is at a higher than average risk of deficient intake of food due to still not eating average of 1500 calories a day.  Assessment/Plan:   1. Hyperlipidemia associated with type 2 diabetes mellitus (Alexandra Walker) Alexandra Walker will continue with current medications with no changes in dose.  2. Type 2 diabetes mellitus with hyperglycemia, without long-term current use of insulin (Alexandra Walker) We will refill Ozempic 0.5 mg subcutaneous once weekly for 1 month with 0 refills.  -Refill  Semaglutide,0.25 or 0.'5MG'$ /DOS, (OZEMPIC, 0.25 OR 0.5 MG/DOSE,) 2 MG/3ML SOPN; Inject 0.5 mg into the skin once a week.  Dispense: 3 mL; Refill: 0  3. At risk for deficient intake of food Alexandra Walker was given approximately 15 minutes of deficient intake of food prevention counseling today. Alexandra Walker is at risk for eating too few calories based on current food recall. She was encouraged to focus on meeting caloric and protein goals according to her recommended meal plan.  4. Obesity with current BMI of 40.4 Alexandra Walker is currently in the action stage of change. As such, her goal is to continue with weight loss efforts. She has agreed to keeping a food journal and adhering to recommended goals of 1450-1600 calories and 95+ grams of protein daily.  Exercise goals: As is.  Behavioral modification strategies: increasing lean protein intake, meal planning and cooking strategies, keeping healthy foods in the home, and planning for success.  Alexandra Walker has agreed to follow-up with our clinic in 3 weeks. She was informed of the importance of frequent follow-up visits to maximize her success with intensive lifestyle modifications for her multiple health conditions.   Objective:   Blood pressure 110/66, pulse 72, temperature 98.4 F (36.9 C), height '5\' 6"'$  (1.676 m), weight 250 lb (113.4 kg), last menstrual period 05/20/2008, SpO2 98 %. Body mass index is 40.35 kg/m.  General: Cooperative, alert, well developed, in no acute distress. HEENT: Conjunctivae and lids unremarkable. Cardiovascular: Regular rhythm.  Lungs: Normal work of breathing. Neurologic: No focal deficits.   Lab Results  Component Value Date   CREATININE 0.71 09/03/2021   BUN  21 09/03/2021   NA 142 09/03/2021   K 4.0 09/03/2021   CL 102 09/03/2021   CO2 26 09/03/2021   Lab Results  Component Value Date   ALT 15 09/03/2021   AST 15 09/03/2021   ALKPHOS 83 09/03/2021   BILITOT 0.4 09/03/2021   Lab Results  Component Value Date   HGBA1C 6.2  (H) 09/03/2021   HGBA1C 6.3 (A) 05/07/2021   HGBA1C 6.8 (H) 11/13/2020   HGBA1C 6.0 (H) 05/05/2020   HGBA1C 5.5 05/05/2020   Lab Results  Component Value Date   INSULIN 14.6 09/03/2021   INSULIN 12.5 05/24/2021   INSULIN 12.5 03/13/2017   INSULIN 10.0 10/28/2016   Lab Results  Component Value Date   TSH 1.960 05/24/2021   Lab Results  Component Value Date   CHOL 134 09/03/2021   HDL 39 (L) 09/03/2021   LDLCALC 70 09/03/2021   LDLDIRECT 118.0 05/24/2019   TRIG 141 09/03/2021   CHOLHDL 3 11/13/2020   Lab Results  Component Value Date   VD25OH 46.6 09/03/2021   VD25OH 50.7 05/24/2021   VD25OH 58.00 11/13/2020   Lab Results  Component Value Date   WBC 5.8 05/24/2021   HGB 13.6 05/24/2021   HCT 40.4 05/24/2021   MCV 84 05/24/2021   PLT 225 05/24/2021   No results found for: "IRON", "TIBC", "FERRITIN"  Attestation Statements:   Reviewed by clinician on day of visit: allergies, medications, problem list, medical history, surgical history, family history, social history, and previous encounter notes.  I, Elnora Morrison, RMA am acting as transcriptionist for Coralie Common, MD.  I have reviewed the above documentation for accuracy and completeness, and I agree with the above. -  ***

## 2021-10-29 ENCOUNTER — Encounter: Payer: 59 | Admitting: Adult Health

## 2021-10-30 ENCOUNTER — Other Ambulatory Visit (HOSPITAL_BASED_OUTPATIENT_CLINIC_OR_DEPARTMENT_OTHER): Payer: Self-pay

## 2021-10-30 DIAGNOSIS — G4733 Obstructive sleep apnea (adult) (pediatric): Secondary | ICD-10-CM | POA: Diagnosis not present

## 2021-11-12 ENCOUNTER — Other Ambulatory Visit (HOSPITAL_BASED_OUTPATIENT_CLINIC_OR_DEPARTMENT_OTHER): Payer: Self-pay

## 2021-11-12 ENCOUNTER — Ambulatory Visit (INDEPENDENT_AMBULATORY_CARE_PROVIDER_SITE_OTHER): Payer: 59 | Admitting: Family Medicine

## 2021-11-12 ENCOUNTER — Encounter (INDEPENDENT_AMBULATORY_CARE_PROVIDER_SITE_OTHER): Payer: Self-pay | Admitting: Family Medicine

## 2021-11-12 VITALS — BP 96/66 | HR 71 | Temp 98.6°F | Ht 66.0 in | Wt 253.0 lb

## 2021-11-12 DIAGNOSIS — Z7985 Long-term (current) use of injectable non-insulin antidiabetic drugs: Secondary | ICD-10-CM | POA: Diagnosis not present

## 2021-11-12 DIAGNOSIS — E1159 Type 2 diabetes mellitus with other circulatory complications: Secondary | ICD-10-CM | POA: Diagnosis not present

## 2021-11-12 DIAGNOSIS — I152 Hypertension secondary to endocrine disorders: Secondary | ICD-10-CM

## 2021-11-12 DIAGNOSIS — Z6841 Body Mass Index (BMI) 40.0 and over, adult: Secondary | ICD-10-CM

## 2021-11-12 DIAGNOSIS — Z9189 Other specified personal risk factors, not elsewhere classified: Secondary | ICD-10-CM

## 2021-11-12 DIAGNOSIS — E669 Obesity, unspecified: Secondary | ICD-10-CM

## 2021-11-12 DIAGNOSIS — E1165 Type 2 diabetes mellitus with hyperglycemia: Secondary | ICD-10-CM

## 2021-11-12 MED ORDER — OZEMPIC (0.25 OR 0.5 MG/DOSE) 2 MG/3ML ~~LOC~~ SOPN
0.5000 mg | PEN_INJECTOR | SUBCUTANEOUS | 0 refills | Status: DC
  Filled 2021-11-12 – 2021-11-27 (×2): qty 3, 28d supply, fill #0

## 2021-11-14 ENCOUNTER — Other Ambulatory Visit (HOSPITAL_COMMUNITY): Payer: Self-pay

## 2021-11-14 NOTE — Progress Notes (Signed)
Chief Complaint:   OBESITY Alexandra Walker is here to discuss her progress with her obesity treatment plan along with follow-up of her obesity related diagnoses. Alexandra Walker is on keeping a food journal and adhering to recommended goals of 1450-1600 calories and 95+ grams of protein and states she is following her eating plan approximately 20% of the time. Alexandra Walker states she is doing yard work 4-5 minutes 2 times per week.  Today's visit was #: 9 Starting weight: 265 lbs Starting date: 05/24/2021 Today's weight: 253 lbs Today's date: 11/12/2021 Total lbs lost to date: 12 lbs Total lbs lost since last in-office visit: 0  Interim History: Alexandra Walker had significant insomnia last week. Her eating has also been sporadic and not as nutritious. No clear cause of insomnia. Regular routine over last few weeks. She does realize that she has not been getting calories in and what she did take in was an increase in carbs.  Subjective:   1. Hypertension associated with diabetes ([place]) Alexandra Walker's blood pressure is well controlled today. Denies chest pain, chest pressure and headache. She is currently taking HCTZ, Toprol, and Cozaar. Her blood pressure at low end of normal, has not been drinking much water today.  2. Type 2 diabetes mellitus with hyperglycemia, without long-term current use of insulin ([place]) Alexandra Walker is currently on Ozempic 0.5 mg SubQ weekly. She does have occasional hunger but more frequently is full/satisfied.  3. At risk for deficient intake of food The patient is at a higher than average risk of deficient intake of food due to not eating much due to insomnia.  Assessment/Plan:   1. Hypertension associated with diabetes ([place]) Follow up with blood pressure at next appointment.  2. Type 2 diabetes mellitus with hyperglycemia, without long-term current use of insulin (HCC) We will refill Ozempic 0.5 mg SubQ once weekly for 1 month with 0 refills.  -Refill Semaglutide,0.25 or 0.'5MG'$ /DOS, (OZEMPIC, 0.25 OR  0.5 MG/DOSE,) 2 MG/3ML SOPN; Inject 0.5 mg into the skin once a week.  Dispense: 3 mL; Refill: 0  3. At risk for deficient intake of food Alexandra Walker was given approximately 15 minutes of deficient intake of food prevention counseling today. Alexandra Walker is at risk for eating too few calories based on current food recall. She was encouraged to focus on meeting caloric and protein goals according to her recommended meal plan.  4. Obesity with current BMI of 41.0 Alexandra Walker is currently in the action stage of change. As such, her goal is to continue with weight loss efforts. She has agreed to keeping a food journal and adhering to recommended goals of 1450-1600 calories and 95+ grams of protein daily.  Exercise goals: All adults should avoid inactivity. Some physical activity is better than none, and adults who participate in any amount of physical activity gain some health benefits.  Behavioral modification strategies: increasing lean protein intake, meal planning and cooking strategies, keeping healthy foods in the home, and planning for success.  Alexandra Walker has agreed to follow-up with our clinic in 4 weeks. She was informed of the importance of frequent follow-up visits to maximize her success with intensive lifestyle modifications for her multiple health conditions.   Objective:   Blood pressure 96/66, pulse 71, temperature 98.6 F (37 C), height '5\' 6"'$  (1.676 m), weight 253 lb (114.8 kg), last menstrual period 05/20/2008, SpO2 96 %. Body mass index is 40.84 kg/m.  General: Cooperative, alert, well developed, in no acute distress. HEENT: Conjunctivae and lids unremarkable. Cardiovascular: Regular rhythm.  Lungs: Normal  work of breathing. Neurologic: No focal deficits.   Lab Results  Component Value Date   CREATININE 0.71 09/03/2021   BUN 21 09/03/2021   NA 142 09/03/2021   K 4.0 09/03/2021   CL 102 09/03/2021   CO2 26 09/03/2021   Lab Results  Component Value Date   ALT 15 09/03/2021   AST 15  09/03/2021   ALKPHOS 83 09/03/2021   BILITOT 0.4 09/03/2021   Lab Results  Component Value Date   HGBA1C 6.2 (H) 09/03/2021   HGBA1C 6.3 (A) 05/07/2021   HGBA1C 6.8 (H) 11/13/2020   HGBA1C 6.0 (H) 05/05/2020   HGBA1C 5.5 05/05/2020   Lab Results  Component Value Date   INSULIN 14.6 09/03/2021   INSULIN 12.5 05/24/2021   INSULIN 12.5 03/13/2017   INSULIN 10.0 10/28/2016   Lab Results  Component Value Date   TSH 1.960 05/24/2021   Lab Results  Component Value Date   CHOL 134 09/03/2021   HDL 39 (L) 09/03/2021   LDLCALC 70 09/03/2021   LDLDIRECT 118.0 05/24/2019   TRIG 141 09/03/2021   CHOLHDL 3 11/13/2020   Lab Results  Component Value Date   VD25OH 46.6 09/03/2021   VD25OH 50.7 05/24/2021   VD25OH 58.00 11/13/2020   Lab Results  Component Value Date   WBC 5.8 05/24/2021   HGB 13.6 05/24/2021   HCT 40.4 05/24/2021   MCV 84 05/24/2021   PLT 225 05/24/2021   No results found for: "IRON", "TIBC", "FERRITIN"  Attestation Statements:   Reviewed by clinician on day of visit: allergies, medications, problem list, medical history, surgical history, family history, social history, and previous encounter notes.  I, Elnora Morrison, RMA am acting as transcriptionist for Coralie Common, MD.  I have reviewed the above documentation for accuracy and completeness, and I agree with the above. - Coralie Common, MD

## 2021-11-19 ENCOUNTER — Encounter: Payer: Self-pay | Admitting: Family Medicine

## 2021-11-19 ENCOUNTER — Other Ambulatory Visit (HOSPITAL_BASED_OUTPATIENT_CLINIC_OR_DEPARTMENT_OTHER): Payer: Self-pay

## 2021-11-19 ENCOUNTER — Ambulatory Visit (INDEPENDENT_AMBULATORY_CARE_PROVIDER_SITE_OTHER): Payer: 59 | Admitting: Family Medicine

## 2021-11-19 VITALS — BP 110/60 | HR 78 | Temp 98.1°F | Ht 66.0 in | Wt 258.2 lb

## 2021-11-19 DIAGNOSIS — I152 Hypertension secondary to endocrine disorders: Secondary | ICD-10-CM

## 2021-11-19 DIAGNOSIS — Z23 Encounter for immunization: Secondary | ICD-10-CM

## 2021-11-19 DIAGNOSIS — G4733 Obstructive sleep apnea (adult) (pediatric): Secondary | ICD-10-CM

## 2021-11-19 DIAGNOSIS — I10 Essential (primary) hypertension: Secondary | ICD-10-CM

## 2021-11-19 DIAGNOSIS — E1159 Type 2 diabetes mellitus with other circulatory complications: Secondary | ICD-10-CM | POA: Diagnosis not present

## 2021-11-19 DIAGNOSIS — K760 Fatty (change of) liver, not elsewhere classified: Secondary | ICD-10-CM

## 2021-11-19 DIAGNOSIS — Z9989 Dependence on other enabling machines and devices: Secondary | ICD-10-CM

## 2021-11-19 DIAGNOSIS — F902 Attention-deficit hyperactivity disorder, combined type: Secondary | ICD-10-CM

## 2021-11-19 DIAGNOSIS — L538 Other specified erythematous conditions: Secondary | ICD-10-CM | POA: Diagnosis not present

## 2021-11-19 DIAGNOSIS — E782 Mixed hyperlipidemia: Secondary | ICD-10-CM | POA: Diagnosis not present

## 2021-11-19 DIAGNOSIS — L298 Other pruritus: Secondary | ICD-10-CM | POA: Diagnosis not present

## 2021-11-19 DIAGNOSIS — R208 Other disturbances of skin sensation: Secondary | ICD-10-CM | POA: Diagnosis not present

## 2021-11-19 DIAGNOSIS — L820 Inflamed seborrheic keratosis: Secondary | ICD-10-CM | POA: Diagnosis not present

## 2021-11-19 DIAGNOSIS — L708 Other acne: Secondary | ICD-10-CM | POA: Diagnosis not present

## 2021-11-19 DIAGNOSIS — I471 Supraventricular tachycardia: Secondary | ICD-10-CM

## 2021-11-19 DIAGNOSIS — Z0000 Encounter for general adult medical examination without abnormal findings: Secondary | ICD-10-CM | POA: Diagnosis not present

## 2021-11-19 DIAGNOSIS — D6861 Antiphospholipid syndrome: Secondary | ICD-10-CM | POA: Diagnosis not present

## 2021-11-19 MED ORDER — AMPHETAMINE-DEXTROAMPHETAMINE 10 MG PO TABS
10.0000 mg | ORAL_TABLET | Freq: Every day | ORAL | 0 refills | Status: DC | PRN
Start: 2021-11-19 — End: 2021-12-21
  Filled 2021-11-19: qty 30, 30d supply, fill #0

## 2021-11-19 NOTE — Progress Notes (Signed)
Subjective  Chief Complaint  Patient presents with   Annual Exam    Pt here for Annual exam and is not currently fasting.    HPI: Alexandra Walker is a 66 y.o. female who presents to [facility] [place] today for a Female Wellness Visit. She also has the concerns and/or needs as listed above in the chief complaint. These will be addressed in addition to the Health Maintenance Visit.   Wellness Visit: annual visit with health maintenance review and exam without Pap  Health maintenance: Mammogram due next month.  Colonoscopy due by December.  Patient to schedule both.  Eye exam and skin annual check are up-to-date.  Working nights and liking it.  Eligible for Shingrix. Chronic disease f/u and/or acute problem visit: (deemed necessary to be done in addition to the wellness visit): PSVT and hypertension are well controlled on beta-blocker, metoprolol 75 mg daily and HCTZ 25 daily.  No symptoms of arrhythmia or ischemia.  No adverse effects.  Nonfasting Hyperlipidemia on rosuvastatin 10 mg nightly and tolerating well.  Has history of hepatic steatosis.  Nonfasting for recheck today.  No abdominal pain. Obesity: Working with healthy weight and wellness.  Now on Ozempic for both diabetic control and weight loss. Type II well-controlled diabetes now on Ozempic.  No adverse effects. ADD: Rare use of Adderall.  However she does notice some mild increased anxiety decreased ability to get things done.  Not certain if this is related to mood or ADD symptoms.  Request refill. Daily aspirin for anticardiolipin syndrome.  No adverse effects.  Assessment  1. Annual physical exam   2. Paroxysmal SVT (supraventricular tachycardia) ([place])   3. Essential hypertension   4. OSA on CPAP, followed by Neurology/Sleep   5. Mixed hyperlipidemia   6. Attention deficit hyperactivity disorder (ADHD), combined type   7. Morbid obesity ([place])   8. Hypertension associated with type 2 diabetes mellitus  ([place])   9. Hepatic steatosis   10. 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[place])   11. Need for shingles vaccine      Plan  Female Wellness Visit: Age appropriate Health Maintenance and Prevention measures were discussed with patient. Included topics are cancer screening recommendations, ways to keep healthy (see AVS) including dietary and exercise recommendations, regular eye and dental care, use of seat belts, and avoidance of moderate alcohol use and tobacco use.  Patient to schedule mammogram and colonoscopy this year BMI: discussed patient's BMI and encouraged positive lifestyle modifications to help get to or maintain a target BMI. HM needs and immunizations were addressed and ordered. See below for orders. See HM and immunization section for updates.  First Shingrix given today. Routine labs and screening tests ordered including cmp, cbc and lipids where appropriate. Discussed recommendations regarding Vit D and calcium supplementation (see AVS)  Chronic disease management visit and/or acute problem visit: Hypertension: Well-controlled.  Continue metoprolol 75 mg daily and HCTZ 25 daily.  Reviewed renal function normal  hepatic steatosis and statin use, Crestor 10 nightly.  Reviewed LFTs and lipids.  At goal Obesity and diabetes: Now on Ozempic.  Reviewed recent labs.  A1c at 6.2.  Well-controlled.  Recommend increasing dose of Ozempic for better control and weight loss.  Patient discussed with healthy weight and wellness. Continue aspirin therapy. Sleep apnea well-controlled on CPAP.  Monitoring along with neurology PSVT is well controlled on beta-blocker. Mood and ADD: Discussed symptoms of each.  Patient to monitor.  She will start Adderall daily to see if  that helps her symptoms.  If she still has negative depressive symptoms, she will contact me and we will discuss treatment with Prozac or Lexapro.  Follow up: 6 months for hypertension and second Shingrix.  And diabetes Orders Placed This  Encounter  Procedures   Varicella-zoster vaccine IM (Shingrix)   Meds ordered this encounter  Medications   amphetamine-dextroamphetamine (ADDERALL) 10 MG tablet    Sig: Take 1 tablet (10 mg total) by mouth daily as needed.    Dispense:  30 tablet    Refill:  0      Body mass index is 41.67 kg/m. Wt Readings from Last 3 Encounters:  11/19/21 258 lb 3.2 oz (117.1 kg)  11/12/21 253 lb (114.8 kg)  10/22/21 250 lb (113.4 kg)     Patient Active Problem List   Diagnosis Date Noted   Type 2 diabetes, diet controlled ([place]) 05/07/2021    Priority: High   Morbid obesity ([place]) 08/07/2020    Priority: High   Attention deficit hyperactivity disorder (ADHD), combined type 10/19/2017    Priority: High   OSA on CPAP, followed by Neurology/Sleep 09/26/2016    Priority: High    Dr. Brett Fairy. Sleep test repeat: 2023, mild, continue cpap. Auto titrated    Essential hypertension 01/31/2016    Priority: High   Mixed hyperlipidemia 05/10/2015    Priority: High   Paroxysmal SVT (supraventricular tachycardia) ([place]) 09/19/2011    Priority: High    SVT/PACs controlled on BB; managed by cardiology. No CAD    Bilateral hip bursitis 05/06/2019    Priority: Medium    Nephrolithiasis 03/29/2018    Priority: Medium     H/o recurrent pyelo/urosepsis; s/p stone retrieval and stenting.  Has nonobstructive stone that persists.  Dr. Tresa Moore    Anticardiolipin syndrome [facility])     Priority: Medium    Recurrent circadian rhythm sleep disorder, shift work type 03/31/2017    Priority: Medium    IBS (irritable bowel syndrome), diarrhea predominant 03/17/2017    Priority: Medium    Hepatic steatosis 09/26/2016    Priority: Medium    Diverticulosis of colon 05/16/2018    Priority: Low   Drug-induced pancreatitis, 2015, thought to be due to [facility] 03/29/2018    Priority: Low   History of vitreous detachment OU, HTN retinopathy OU, High myopia OU, Cataracts OU, Vitreous floaters OU, Keratoconjunctivitis  sicca OU not specified as Sjogren's 03/29/2018    Priority: Low   History of sepsis 02/01/2016    Priority: Low    Due to urosepsis: had acute short lived afib/psvt. Neg cardiac eval since.     Vitamin D deficiency, on Vit D 2000 IU daily 05/10/2015    Priority: [place] Maintenance  Topic Date Due   COVID-19 Vaccine (4 - Mixed Product series) 12/05/2021 (Originally 07/31/2019)   INFLUENZA VACCINE  12/18/2021   MAMMOGRAM  01/01/2022   Zoster Vaccines- Shingrix (2 of 2) 01/14/2022   HEMOGLOBIN A1C  03/05/2022   OPHTHALMOLOGY EXAM  03/20/2022   COLONOSCOPY (Pts 45-51yr Insurance coverage will need to be confirmed)  05/18/2022   FOOT EXAM  11/20/2022   TETANUS/TDAP  10/31/2029   Hepatitis C Screening  Completed   HIV Screening  Completed   HPV VACCINES  Aged Out   Immunization History  Administered Date(s) Administered   Influenza, High Dose Seasonal PF 01/21/2013   Influenza,inj,Quad PF,6+ Mos 01/21/2013, 03/09/2018, 03/19/2019, 03/13/2021   Influenza,inj,quad, With Preservative 02/18/2015   Influenza-Unspecified 02/18/2015, 02/23/2016, 03/13/2017, 03/20/2020   Moderna  Sars-Covid-2 Vaccination 04/20/2019   PFIZER(Purple Top)SARS-COV-2 Vaccination 05/15/2019, 06/05/2019   PNEUMOCOCCAL CONJUGATE-20 05/07/2021   Pneumococcal Polysaccharide-23 08/01/2008   Rabies, IM 10/14/2011, 10/17/2011, 10/23/2011, 10/31/2011   Rabies, intradermal 10/14/2011   Tdap 06/08/2007, 11/01/2019   Zoster Recombinat (Shingrix) 11/19/2021   We updated and reviewed the patient's past history in detail and it is documented below. Allergies: Patient is allergic to ace inhibitors, oxycodone-acetaminophen, vicodin [hydrocodone-acetaminophen], victoza [liraglutide], and adhesive [tape]. Past Medical History Patient  has a past medical history of ADD (attention deficit disorder), Allergy, 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[place]), Bilateral swelling of feet, Cholelithiasis, Chronic depressive disorder, Circadian  rhythm sleep disorder, shift work, DDD (degenerative disc disease), lumbosacral (05/2012), Diabetes ([place]), Diverticulosis, GERD (gastroesophageal reflux disease), Hepatic steatosis, History of hiatal hernia (2017), HLA B27 (HLA B27 positive), pancreatitis, Hyperlipidemia, Hypertension, IBS (irritable bowel syndrome), Lactose intolerance, Lumbar scoliosis (2010), Migraine headache without aura (05/10/2015), Nephrolithiasis (2014), Obesity due to excess calories with serious comorbidity (05/24/2019), Palpitations, Prediabetes, Presbyopia, Right bundle branch block, Sepsis ([place]) (01/2016), Sleep hypopnea, and Vitamin D deficiency. Past Surgical History Patient  has a past surgical history that includes Colonoscopy w/ biopsies (multiple); Esophagogastroduodenoscopy (multiple); Knee arthroscopy (Left, 11/2008); Dilation and curettage of uterus (11/2009); Cervical cone biopsy; Mass excision (Left, 12/24/2012); Diagnostic laparoscopy; Cholecystectomy (N/A, 10/26/2014); Bladder surgery; Laparoscopic vaginal hysterectomy with salpingo oophorectomy (Bilateral, 10/03/2015); LEFT FINGER SURGERY; Breast biopsy (Left); Eye surgery; LASIK (1990); Dupuytren contracture release; Liver biopsy; Photorefractive keratotomy (1994); Cystoscopy with retrograde pyelogram, ureteroscopy and stent placement (Left, 06/26/2018); Holmium laser application (Left, 16/02/9603); and Radial keratotomy (1994). Family History: Patient family history includes Coronary artery disease in her mother; Depression in her mother; Diabetes in her mother; Heart attack in her father and mother; Heart disease in her father and mother; Hyperlipidemia in her mother; Hypertension in her brother, mother, and sister; Kidney disease in her mother; Obesity in her mother; Sleep apnea in her mother; Sudden death in her father. Social History:  Patient  reports that she has never smoked. She has never used smokeless tobacco. She reports current alcohol use. She reports  that she does not use drugs.  Review of Systems: Constitutional: negative for fever or malaise Ophthalmic: negative for photophobia, double vision or loss of vision Cardiovascular: negative for chest pain, dyspnea on exertion, or new LE swelling Respiratory: negative for SOB or persistent cough Gastrointestinal: negative for abdominal pain, change in bowel habits or melena Genitourinary: negative for dysuria or gross hematuria, no abnormal uterine bleeding or disharge Musculoskeletal: negative for new gait disturbance or muscular weakness Integumentary: negative for new or persistent rashes, no breast lumps Neurological: negative for TIA or stroke symptoms Psychiatric: negative for SI or delusions Allergic/Immunologic: negative for hives  Patient Care Team    Relationship Specialty Notifications Start End  Leamon Arnt, MD PCP - General Family Medicine  04/07/19   Burnell Blanks, MD PCP - Cardiology Cardiology  08/21/20   Roseanne Kaufman, MD Consulting Physician Orthopedic Surgery  02/11/19   Burnell Blanks, MD Consulting Physician Cardiology  05/24/19   Cornelius Moras, NP Referring Physician Gastroenterology  05/24/19   Dohmeier, Asencion Partridge, MD Consulting Physician Neurology  05/24/19   Alexis Frock, MD Consulting Physician Urology  05/24/19   Gatha Mayer, MD Consulting Physician Gastroenterology  05/24/19     Objective  Vitals: BP 110/60 Comment: at recent MD visits  Pulse 78   Temp 98.1 F (36.7 C)   Ht _0  (1.676 m)   Wt 258 lb 3.2 oz (117.1 kg)  LMP 05/20/2008   SpO2 98%   BMI 41.67 kg/m  General:  Well developed, well nourished, no acute distress  Psych:  Alert and orientedx3,normal mood and affect HEENT:  Normocephalic, atraumatic, non-icteric sclera,  supple neck without adenopathy, mass or thyromegaly Cardiovascular:  Normal S1, S2, RRR without gallop, rub or murmur Respiratory:  Good breath sounds bilaterally, CTAB with normal respiratory  effort Gastrointestinal: normal bowel sounds, soft, non-tender, no noted masses. No HSM MSK: no deformities, contusions. Joints are without erythema or swelling.  Skin:  Warm, no rashes or suspicious lesions noted Neurologic:    Mental status is normal. CN 2-11 are normal. Gross motor and sensory exams are normal. Normal gait. No tremor Normal foot exam today.  Commons side effects, risks, benefits, and alternatives for medications and treatment plan prescribed today were discussed, and the patient expressed understanding of the given instructions. Patient is instructed to call or message via MyChart if he/she has any questions or concerns regarding our treatment plan. No barriers to understanding were identified. We discussed Red Flag symptoms and signs in detail. Patient expressed understanding regarding what to do in case of urgent or emergency type symptoms.  Medication list was reconciled, printed and provided to the patient in AVS. Patient instructions and summary information was reviewed with the patient as documented in the AVS. This note was prepared with assistance of Dragon voice recognition software. Occasional wrong-word or sound-a-like substitutions may have occurred due to the inherent limitations of voice recognition software  This visit occurred during the SARS-CoV-2 public health emergency.  Safety protocols were in place, including screening questions prior to the visit, additional usage of staff PPE, and extensive cleaning of exam room while observing appropriate contact time as indicated for disinfecting solutions.

## 2021-11-19 NOTE — Patient Instructions (Signed)
Please return in 6 months for hypertension follow up.   Today you were given your 1st of 2 Shingrix Vaccinations. We will give the 2nd in 6 months.  Follow up for mood if needed.   If you have any questions or concerns, please don't hesitate to send me a message via MyChart or call the office at [phone]. Thank you for visiting with [name] today! It's our pleasure caring for you.

## 2021-11-26 ENCOUNTER — Encounter: Payer: Self-pay | Admitting: Family Medicine

## 2021-11-27 ENCOUNTER — Other Ambulatory Visit (HOSPITAL_BASED_OUTPATIENT_CLINIC_OR_DEPARTMENT_OTHER): Payer: Self-pay

## 2021-11-28 ENCOUNTER — Ambulatory Visit: Payer: 59 | Admitting: Family Medicine

## 2021-12-10 ENCOUNTER — Ambulatory Visit (INDEPENDENT_AMBULATORY_CARE_PROVIDER_SITE_OTHER): Payer: 59 | Admitting: Family Medicine

## 2021-12-11 ENCOUNTER — Other Ambulatory Visit (HOSPITAL_BASED_OUTPATIENT_CLINIC_OR_DEPARTMENT_OTHER): Payer: Self-pay

## 2021-12-11 ENCOUNTER — Encounter: Payer: Self-pay | Admitting: Family Medicine

## 2021-12-11 ENCOUNTER — Other Ambulatory Visit (INDEPENDENT_AMBULATORY_CARE_PROVIDER_SITE_OTHER): Payer: Self-pay | Admitting: Family Medicine

## 2021-12-11 DIAGNOSIS — E1165 Type 2 diabetes mellitus with hyperglycemia: Secondary | ICD-10-CM

## 2021-12-21 ENCOUNTER — Other Ambulatory Visit: Payer: Self-pay | Admitting: Family Medicine

## 2021-12-21 ENCOUNTER — Other Ambulatory Visit (INDEPENDENT_AMBULATORY_CARE_PROVIDER_SITE_OTHER): Payer: Self-pay | Admitting: Family Medicine

## 2021-12-21 DIAGNOSIS — E1165 Type 2 diabetes mellitus with hyperglycemia: Secondary | ICD-10-CM

## 2021-12-24 ENCOUNTER — Other Ambulatory Visit (HOSPITAL_BASED_OUTPATIENT_CLINIC_OR_DEPARTMENT_OTHER): Payer: Self-pay

## 2021-12-24 MED ORDER — AMPHETAMINE-DEXTROAMPHETAMINE 10 MG PO TABS
10.0000 mg | ORAL_TABLET | Freq: Every day | ORAL | 0 refills | Status: DC | PRN
Start: 2021-12-24 — End: 2022-02-26
  Filled 2021-12-24: qty 30, 30d supply, fill #0

## 2021-12-25 ENCOUNTER — Other Ambulatory Visit (HOSPITAL_BASED_OUTPATIENT_CLINIC_OR_DEPARTMENT_OTHER): Payer: Self-pay

## 2021-12-25 ENCOUNTER — Encounter (INDEPENDENT_AMBULATORY_CARE_PROVIDER_SITE_OTHER): Payer: Self-pay | Admitting: Family Medicine

## 2021-12-25 NOTE — Telephone Encounter (Signed)
Please advise 

## 2021-12-26 ENCOUNTER — Encounter (INDEPENDENT_AMBULATORY_CARE_PROVIDER_SITE_OTHER): Payer: Self-pay

## 2021-12-27 ENCOUNTER — Other Ambulatory Visit (HOSPITAL_BASED_OUTPATIENT_CLINIC_OR_DEPARTMENT_OTHER): Payer: Self-pay

## 2021-12-27 ENCOUNTER — Other Ambulatory Visit (INDEPENDENT_AMBULATORY_CARE_PROVIDER_SITE_OTHER): Payer: Self-pay | Admitting: Family Medicine

## 2021-12-27 DIAGNOSIS — E1165 Type 2 diabetes mellitus with hyperglycemia: Secondary | ICD-10-CM

## 2021-12-27 MED ORDER — OZEMPIC (0.25 OR 0.5 MG/DOSE) 2 MG/3ML ~~LOC~~ SOPN
0.5000 mg | PEN_INJECTOR | SUBCUTANEOUS | 0 refills | Status: DC
  Filled 2021-12-27: qty 3, 28d supply, fill #0

## 2022-01-07 ENCOUNTER — Encounter (INDEPENDENT_AMBULATORY_CARE_PROVIDER_SITE_OTHER): Payer: Self-pay | Admitting: Family Medicine

## 2022-01-07 ENCOUNTER — Other Ambulatory Visit (HOSPITAL_BASED_OUTPATIENT_CLINIC_OR_DEPARTMENT_OTHER): Payer: Self-pay

## 2022-01-07 ENCOUNTER — Ambulatory Visit (INDEPENDENT_AMBULATORY_CARE_PROVIDER_SITE_OTHER): Payer: 59 | Admitting: Family Medicine

## 2022-01-07 VITALS — BP 122/80 | HR 73 | Temp 98.7°F | Ht 66.0 in | Wt 249.0 lb

## 2022-01-07 DIAGNOSIS — Z7985 Long-term (current) use of injectable non-insulin antidiabetic drugs: Secondary | ICD-10-CM

## 2022-01-07 DIAGNOSIS — E1159 Type 2 diabetes mellitus with other circulatory complications: Secondary | ICD-10-CM

## 2022-01-07 DIAGNOSIS — Z6841 Body Mass Index (BMI) 40.0 and over, adult: Secondary | ICD-10-CM | POA: Diagnosis not present

## 2022-01-07 DIAGNOSIS — E669 Obesity, unspecified: Secondary | ICD-10-CM

## 2022-01-07 DIAGNOSIS — E1165 Type 2 diabetes mellitus with hyperglycemia: Secondary | ICD-10-CM | POA: Diagnosis not present

## 2022-01-07 DIAGNOSIS — I152 Hypertension secondary to endocrine disorders: Secondary | ICD-10-CM

## 2022-01-07 MED ORDER — OZEMPIC (0.25 OR 0.5 MG/DOSE) 2 MG/3ML ~~LOC~~ SOPN
0.5000 mg | PEN_INJECTOR | SUBCUTANEOUS | 0 refills | Status: DC
  Filled 2022-01-07 – 2022-01-22 (×4): qty 3, 28d supply, fill #0

## 2022-01-15 ENCOUNTER — Other Ambulatory Visit (HOSPITAL_BASED_OUTPATIENT_CLINIC_OR_DEPARTMENT_OTHER): Payer: Self-pay

## 2022-01-17 ENCOUNTER — Other Ambulatory Visit (HOSPITAL_BASED_OUTPATIENT_CLINIC_OR_DEPARTMENT_OTHER): Payer: Self-pay

## 2022-01-17 NOTE — Progress Notes (Signed)
Chief Complaint:   OBESITY Alexandra Walker is here to discuss her progress with her obesity treatment plan along with follow-up of her obesity related diagnoses. Alexandra Walker is on keeping a food journal and adhering to recommended goals of 1450-1600 calories and 95+ grams of protein and states she is following her eating plan approximately 40% of the time. Alexandra Walker states she is working in her yard.   Today's visit was #: 10 Starting weight: 265 lbs Starting date: 05/24/2021 Today's weight: 249 lbs Today's date: 01/07/2022 Total lbs lost to date: 16 lbs Total lbs lost since last in-office visit: 4  Interim History: Alexandra Walker has had a rough few weeks--productive cough, nasal congestion and has not been prepping protein as readily. Has been trying to do more self care activity. Feels motivation is decreased in last few weeks. Going to a conference early Sept to [facility].  Subjective:   1. Type 2 diabetes mellitus with hyperglycemia, without long-term current use of insulin (HCC) Alexandra Walker is on Ozempic 0.5 mg SubQ. She feels like good appetite control.  2. Hypertension associated with diabetes [facility]) Alexandra Walker will continue with current medications without changes in doses or medications.  Assessment/Plan:   1. Type 2 diabetes mellitus with hyperglycemia, without long-term current use of insulin (HCC) We will refill Ozempic 0.5 mg SubQ once weekly for 1 month with 0 refills.  -Refill Semaglutide,0.25 or 0.'5MG'$ /DOS, (OZEMPIC, 0.25 OR 0.5 MG/DOSE,) 2 MG/3ML SOPN; Inject 0.5 mg into the skin once a week.  Dispense: 3 mL; Refill: 0  2. Hypertension associated with diabetes [facility]) Alexandra Walker will continue current medication without changes in dose or medication.  3. Obesity with current BMI of 40.3 Alexandra Walker is currently in the action stage of change. As such, her goal is to continue with weight loss efforts. She has agreed to keeping a food journal and adhering to recommended goals of 1450-1600 calories and 95+ grams of  protein daily.   Exercise goals: All adults should avoid inactivity. Some physical activity is better than none, and adults who participate in any amount of physical activity gain some health benefits.  Alexandra Walker is to work on 5-10 minute 4 times a week  on treadmill.  Behavioral modification strategies: increasing lean protein intake, meal planning and cooking strategies, keeping healthy foods in the home, and planning for success.  Alexandra Walker has agreed to follow-up with our clinic in 4 weeks. She was informed of the importance of frequent follow-up visits to maximize her success with intensive lifestyle modifications for her multiple health conditions.   Objective:   Blood pressure 122/80, pulse 73, temperature 98.7 F (37.1 C), height '5\' 6"'$  (1.676 m), weight 249 lb (112.9 kg), last menstrual period 05/20/2008, SpO2 98 %. Body mass index is 40.19 kg/m.  General: Cooperative, alert, well developed, in no acute distress. HEENT: Conjunctivae and lids unremarkable. Cardiovascular: Regular rhythm.  Lungs: Normal work of breathing. Neurologic: No focal deficits.   Lab Results  Component Value Date   CREATININE 0.71 09/03/2021   BUN 21 09/03/2021   NA 142 09/03/2021   K 4.0 09/03/2021   CL 102 09/03/2021   CO2 26 09/03/2021   Lab Results  Component Value Date   ALT 15 09/03/2021   AST 15 09/03/2021   ALKPHOS 83 09/03/2021   BILITOT 0.4 09/03/2021   Lab Results  Component Value Date   HGBA1C 6.2 (H) 09/03/2021   HGBA1C 6.3 (A) 05/07/2021   HGBA1C 6.8 (H) 11/13/2020   HGBA1C 6.0 (H) 05/05/2020  HGBA1C 5.5 05/05/2020   Lab Results  Component Value Date   INSULIN 14.6 09/03/2021   INSULIN 12.5 05/24/2021   INSULIN 12.5 03/13/2017   INSULIN 10.0 10/28/2016   Lab Results  Component Value Date   TSH 1.960 05/24/2021   Lab Results  Component Value Date   CHOL 134 09/03/2021   HDL 39 (L) 09/03/2021   LDLCALC 70 09/03/2021   LDLDIRECT 118.0 05/24/2019   TRIG 141 09/03/2021    CHOLHDL 3 11/13/2020   Lab Results  Component Value Date   VD25OH 46.6 09/03/2021   VD25OH 50.7 05/24/2021   VD25OH 58.00 11/13/2020   Lab Results  Component Value Date   WBC 5.8 05/24/2021   HGB 13.6 05/24/2021   HCT 40.4 05/24/2021   MCV 84 05/24/2021   PLT 225 05/24/2021   No results found for: "IRON", "TIBC", "FERRITIN"  Attestation Statements:   Reviewed by clinician on day of visit: allergies, medications, problem list, medical history, surgical history, family history, social history, and previous encounter notes.  I, Elnora Morrison, RMA am acting as transcriptionist for Coralie Common, MD.  I have reviewed the above documentation for accuracy and completeness, and I agree with the above. - Coralie Common, MD

## 2022-01-22 ENCOUNTER — Other Ambulatory Visit (HOSPITAL_BASED_OUTPATIENT_CLINIC_OR_DEPARTMENT_OTHER): Payer: Self-pay

## 2022-02-04 ENCOUNTER — Ambulatory Visit (INDEPENDENT_AMBULATORY_CARE_PROVIDER_SITE_OTHER): Payer: 59 | Admitting: Family Medicine

## 2022-02-04 ENCOUNTER — Other Ambulatory Visit (HOSPITAL_BASED_OUTPATIENT_CLINIC_OR_DEPARTMENT_OTHER): Payer: Self-pay

## 2022-02-04 ENCOUNTER — Encounter (INDEPENDENT_AMBULATORY_CARE_PROVIDER_SITE_OTHER): Payer: Self-pay | Admitting: Family Medicine

## 2022-02-04 VITALS — BP 115/77 | HR 73 | Temp 98.7°F | Ht 66.0 in | Wt 248.0 lb

## 2022-02-04 DIAGNOSIS — Z6841 Body Mass Index (BMI) 40.0 and over, adult: Secondary | ICD-10-CM | POA: Diagnosis not present

## 2022-02-04 DIAGNOSIS — Z7985 Long-term (current) use of injectable non-insulin antidiabetic drugs: Secondary | ICD-10-CM | POA: Diagnosis not present

## 2022-02-04 DIAGNOSIS — E1165 Type 2 diabetes mellitus with hyperglycemia: Secondary | ICD-10-CM | POA: Diagnosis not present

## 2022-02-04 DIAGNOSIS — I152 Hypertension secondary to endocrine disorders: Secondary | ICD-10-CM

## 2022-02-04 DIAGNOSIS — E1159 Type 2 diabetes mellitus with other circulatory complications: Secondary | ICD-10-CM | POA: Diagnosis not present

## 2022-02-04 DIAGNOSIS — E669 Obesity, unspecified: Secondary | ICD-10-CM

## 2022-02-04 MED ORDER — OZEMPIC (0.25 OR 0.5 MG/DOSE) 2 MG/3ML ~~LOC~~ SOPN
0.5000 mg | PEN_INJECTOR | SUBCUTANEOUS | 0 refills | Status: DC
  Filled 2022-02-04 – 2022-02-26 (×2): qty 3, 28d supply, fill #0

## 2022-02-05 NOTE — Progress Notes (Signed)
Chief Complaint:   OBESITY Alexandra Walker is here to discuss her progress with her obesity treatment plan along with follow-up of her obesity related diagnoses. Alexandra Walker is on keeping a food journal and adhering to recommended goals of 1450-1600 calories and 95+ grams of protein and states she is following her eating plan approximately 50-75% of the time. Alexandra Walker states she is walking 10 minutes 7 times per week.  Today's visit was #: 11 Starting weight: 265 lbs Starting date: 05/24/2021 Today's weight: 248 lbs Today's date: 02/04/2022 Total lbs lost to date: 17 lbs Total lbs lost since last in-office visit: 1  Interim History: Alexandra Walker went [place] for [employer]. She feels she really learned a lot. Has not been consistently journaling. Going to see her daughter in the middle of October for the weekend. Feeling tired of repetitiveness of journaling.  Subjective:   1. Hypertension associated with diabetes ([place]) Alexandra Walker's blood pressure well controlled today. Denies chest pain, chest pressure and headache. She is on Cozaar, HCTZ and Toprol.  2. Type 2 diabetes mellitus with hyperglycemia, without long-term current use of insulin ([place]) Alexandra Walker is on Ozempic 0.5 mg weekly. Only minimal occasional nausea.  Assessment/Plan:   1. Hypertension associated with diabetes ([place]) Follow up blood pressure at next appointment--if blood pressure still well controlled with decreased Cozaar to 75 mg.  2. Type 2 diabetes mellitus with hyperglycemia, without long-term current use of insulin (HCC) We will refill Ozempic 0.5 mg SubQ once weekly for 1 month with 0 refills.  -Refill Semaglutide,0.25 or 0.'5MG'$ /DOS, (OZEMPIC, 0.25 OR 0.5 MG/DOSE,) 2 MG/3ML SOPN; Inject 0.5 mg into the skin once a week.  Dispense: 3 mL; Refill: 0  3. Obesity with current BMI of 40.1 Alexandra Walker is currently in the action stage of change. As such, her goal is to continue with weight loss efforts. She has agreed to keeping a food  journal and adhering to recommended goals of 1450-1600 calories and 95+ grams protein daily.  Exercise goals: All adults should avoid inactivity. Some physical activity is better than none, and adults who participate in any amount of physical activity gain some health benefits.  Alexandra Walker is to aim journal 4-5x a week. Keep walking and then add some resistance.  Behavioral modification strategies: increasing lean protein intake, meal planning and cooking strategies, keeping healthy foods in the home, planning for success, and keeping a strict food journal.  Alexandra Walker has agreed to follow-up with our clinic in 4 weeks. She was informed of the importance of frequent follow-up visits to maximize her success with intensive lifestyle modifications for her multiple health conditions.   Objective:   Blood pressure 115/77, pulse 73, temperature 98.7 F (37.1 C), height '5\' 6"'$  (1.676 m), weight 248 lb (112.5 kg), last menstrual period 05/20/2008, SpO2 96 %. Body mass index is 40.03 kg/m.  General: Cooperative, alert, well developed, in no acute distress. HEENT: Conjunctivae and lids unremarkable. Cardiovascular: Regular rhythm.  Lungs: Normal work of breathing. Neurologic: No focal deficits.   Lab Results  Component Value Date   CREATININE 0.71 09/03/2021   BUN 21 09/03/2021   NA 142 09/03/2021   K 4.0 09/03/2021   CL 102 09/03/2021   CO2 26 09/03/2021   Lab Results  Component Value Date   ALT 15 09/03/2021   AST 15 09/03/2021   ALKPHOS 83 09/03/2021   BILITOT 0.4 09/03/2021   Lab Results  Component Value Date   HGBA1C 6.2 (H) 09/03/2021   HGBA1C 6.3 (A) 05/07/2021  HGBA1C 6.8 (H) 11/13/2020   HGBA1C 6.0 (H) 05/05/2020   HGBA1C 5.5 05/05/2020   Lab Results  Component Value Date   INSULIN 14.6 09/03/2021   INSULIN 12.5 05/24/2021   INSULIN 12.5 03/13/2017   INSULIN 10.0 10/28/2016   Lab Results  Component Value Date   TSH 1.960 05/24/2021   Lab Results  Component Value Date    CHOL 134 09/03/2021   HDL 39 (L) 09/03/2021   LDLCALC 70 09/03/2021   LDLDIRECT 118.0 05/24/2019   TRIG 141 09/03/2021   CHOLHDL 3 11/13/2020   Lab Results  Component Value Date   VD25OH 46.6 09/03/2021   VD25OH 50.7 05/24/2021   VD25OH 58.00 11/13/2020   Lab Results  Component Value Date   WBC 5.8 05/24/2021   HGB 13.6 05/24/2021   HCT 40.4 05/24/2021   MCV 84 05/24/2021   PLT 225 05/24/2021   No results found for: "IRON", "TIBC", "FERRITIN"  Attestation Statements:   Reviewed by clinician on day of visit: allergies, medications, problem list, medical history, surgical history, family history, social history, and previous encounter notes.  I, Elnora Morrison, RMA am acting as transcriptionist for Coralie Common, MD.  I have reviewed the above documentation for accuracy and completeness, and I agree with the above. - Coralie Common, MD

## 2022-02-11 ENCOUNTER — Encounter: Payer: Self-pay | Admitting: *Deleted

## 2022-02-26 ENCOUNTER — Ambulatory Visit (INDEPENDENT_AMBULATORY_CARE_PROVIDER_SITE_OTHER): Payer: 59 | Admitting: Family Medicine

## 2022-02-26 ENCOUNTER — Encounter (INDEPENDENT_AMBULATORY_CARE_PROVIDER_SITE_OTHER): Payer: Self-pay | Admitting: Family Medicine

## 2022-02-26 ENCOUNTER — Other Ambulatory Visit (HOSPITAL_BASED_OUTPATIENT_CLINIC_OR_DEPARTMENT_OTHER): Payer: Self-pay

## 2022-02-26 ENCOUNTER — Other Ambulatory Visit: Payer: Self-pay | Admitting: Family Medicine

## 2022-02-26 VITALS — BP 104/70 | HR 65 | Temp 98.5°F | Ht 66.0 in | Wt 250.0 lb

## 2022-02-26 DIAGNOSIS — Z7985 Long-term (current) use of injectable non-insulin antidiabetic drugs: Secondary | ICD-10-CM

## 2022-02-26 DIAGNOSIS — I491 Atrial premature depolarization: Secondary | ICD-10-CM

## 2022-02-26 DIAGNOSIS — I152 Hypertension secondary to endocrine disorders: Secondary | ICD-10-CM

## 2022-02-26 DIAGNOSIS — E1159 Type 2 diabetes mellitus with other circulatory complications: Secondary | ICD-10-CM

## 2022-02-26 DIAGNOSIS — I10 Essential (primary) hypertension: Secondary | ICD-10-CM

## 2022-02-26 DIAGNOSIS — E669 Obesity, unspecified: Secondary | ICD-10-CM

## 2022-02-26 DIAGNOSIS — Z6841 Body Mass Index (BMI) 40.0 and over, adult: Secondary | ICD-10-CM | POA: Diagnosis not present

## 2022-02-26 DIAGNOSIS — E1165 Type 2 diabetes mellitus with hyperglycemia: Secondary | ICD-10-CM

## 2022-02-26 MED ORDER — FLUARIX QUADRIVALENT 0.5 ML IM SUSY
PREFILLED_SYRINGE | INTRAMUSCULAR | 0 refills | Status: DC
  Filled 2022-02-26: qty 0.5, 1d supply, fill #0

## 2022-02-26 MED ORDER — OZEMPIC (0.25 OR 0.5 MG/DOSE) 2 MG/3ML ~~LOC~~ SOPN: 0.5000 mg | PEN_INJECTOR | SUBCUTANEOUS | 0 refills | Status: DC

## 2022-02-26 MED ORDER — OZEMPIC (0.25 OR 0.5 MG/DOSE) 2 MG/3ML ~~LOC~~ SOPN
0.5000 mg | PEN_INJECTOR | SUBCUTANEOUS | 0 refills | Status: DC
  Filled 2022-02-26: qty 3, 28d supply, fill #0
  Filled 2022-03-12 – 2022-03-22 (×2): qty 3, 28d supply, fill #1
  Filled 2022-04-22: qty 3, 28d supply, fill #2

## 2022-02-27 ENCOUNTER — Other Ambulatory Visit (HOSPITAL_BASED_OUTPATIENT_CLINIC_OR_DEPARTMENT_OTHER): Payer: Self-pay

## 2022-02-27 MED ORDER — METOPROLOL SUCCINATE ER 50 MG PO TB24
50.0000 mg | ORAL_TABLET | Freq: Every day | ORAL | 3 refills | Status: DC
  Filled 2022-02-27 – 2022-03-12 (×2): qty 90, 90d supply, fill #0
  Filled 2022-04-22 – 2022-06-17 (×3): qty 90, 90d supply, fill #1

## 2022-02-27 MED ORDER — AMPHETAMINE-DEXTROAMPHETAMINE 10 MG PO TABS
10.0000 mg | ORAL_TABLET | Freq: Every day | ORAL | 0 refills | Status: DC | PRN
  Filled 2022-02-27 – 2022-05-22 (×2): qty 30, 30d supply, fill #0

## 2022-02-28 NOTE — Progress Notes (Signed)
Chief Complaint:   OBESITY Alexandra Walker is here to discuss her progress with her obesity treatment plan along with follow-up of her obesity related diagnoses. Alexandra Walker is on keeping a food journal and adhering to recommended goals of 1450-1600 calories and 95+ grams protein and states she is following her eating plan approximately 50% of the time. Alexandra Walker states she is walking 45 minutes 3 times per week.  Today's visit was #: 12 Starting weight: 265 lbs Starting date: 05/24/2021 Today's weight: 250 lbs Today's date: 02/26/2022 Total lbs lost to date: 15 lbs Total lbs lost since last in-office visit: 0  Interim History: Alexandra Walker has been doing mostly the same work and going to [place] this upcoming weekend to see her daughter. Has been averaging 60 grams of protein/day. Noticing she is not that hungry during the day.  Subjective:   1. Type 2 diabetes mellitus with hyperglycemia, without long-term current use of insulin (HCC) Decrease in hunger. Alexandra Walker did have more consistent GI side effects of Victoza. Only 1 episode of emesis.  2. Essential hypertension Blood pressure very well controlled and low end of normal. Denies chest pain, chest pressure and headache.  Assessment/Plan:   1. Type 2 diabetes mellitus with hyperglycemia, without long-term current use of insulin (HCC) We will refill Ozempic 0.5 mg SubQ once weekly for 1 month with 0 refills.  -Refill Semaglutide,0.25 or 0.'5MG'$ /DOS, (OZEMPIC, 0.25 OR 0.5 MG/DOSE,) 2 MG/3ML SOPN; Inject 0.5 mg into the skin once a week.  Dispense: 9 mL; Refill: 0  2. Essential hypertension Follow up blood pressure at next appointment--hypotension signs/symptoms given.  3. Obesity with current BMI of 40.4 Alexandra Walker is currently in the action stage of change. As such, her goal is to continue with weight loss efforts. She has agreed to keeping a food journal and adhering to recommended goals of 1450-1600 calories and 95+ grams of protein daily.   Exercise goals:  All adults should avoid inactivity. Some physical activity is better than none, and adults who participate in any amount of physical activity gain some health benefits.  Behavioral modification strategies: increasing lean protein intake, meal planning and cooking strategies, keeping healthy foods in the home, and planning for success.  Alexandra Walker has agreed to follow-up with our clinic in 12 weeks. She was informed of the importance of frequent follow-up visits to maximize her success with intensive lifestyle modifications for her multiple health conditions.   Objective:   Blood pressure 104/70, pulse 65, temperature 98.5 F (36.9 C), height '5\' 6"'$  (1.676 m), weight 250 lb (113.4 kg), last menstrual period 05/20/2008, SpO2 99 %. Body mass index is 40.35 kg/m.  General: Cooperative, alert, well developed, in no acute distress. HEENT: Conjunctivae and lids unremarkable. Cardiovascular: Regular rhythm.  Lungs: Normal work of breathing. Neurologic: No focal deficits.   Lab Results  Component Value Date   CREATININE 0.71 09/03/2021   BUN 21 09/03/2021   NA 142 09/03/2021   K 4.0 09/03/2021   CL 102 09/03/2021   CO2 26 09/03/2021   Lab Results  Component Value Date   ALT 15 09/03/2021   AST 15 09/03/2021   ALKPHOS 83 09/03/2021   BILITOT 0.4 09/03/2021   Lab Results  Component Value Date   HGBA1C 6.2 (H) 09/03/2021   HGBA1C 6.3 (A) 05/07/2021   HGBA1C 6.8 (H) 11/13/2020   HGBA1C 6.0 (H) 05/05/2020   HGBA1C 5.5 05/05/2020   Lab Results  Component Value Date   INSULIN 14.6 09/03/2021   INSULIN 12.5  05/24/2021   INSULIN 12.5 03/13/2017   INSULIN 10.0 10/28/2016   Lab Results  Component Value Date   TSH 1.960 05/24/2021   Lab Results  Component Value Date   CHOL 134 09/03/2021   HDL 39 (L) 09/03/2021   LDLCALC 70 09/03/2021   LDLDIRECT 118.0 05/24/2019   TRIG 141 09/03/2021   CHOLHDL 3 11/13/2020   Lab Results  Component Value Date   VD25OH 46.6 09/03/2021   VD25OH  50.7 05/24/2021   VD25OH 58.00 11/13/2020   Lab Results  Component Value Date   WBC 5.8 05/24/2021   HGB 13.6 05/24/2021   HCT 40.4 05/24/2021   MCV 84 05/24/2021   PLT 225 05/24/2021   No results found for: "IRON", "TIBC", "FERRITIN"  Attestation Statements:   Reviewed by clinician on day of visit: allergies, medications, problem list, medical history, surgical history, family history, social history, and previous encounter notes.  I, Elnora Morrison, RMA am acting as transcriptionist for Coralie Common, MD.  I have reviewed the above documentation for accuracy and completeness, and I agree with the above. - Coralie Common, MD

## 2022-03-11 ENCOUNTER — Other Ambulatory Visit (HOSPITAL_BASED_OUTPATIENT_CLINIC_OR_DEPARTMENT_OTHER): Payer: Self-pay

## 2022-03-12 ENCOUNTER — Other Ambulatory Visit (HOSPITAL_BASED_OUTPATIENT_CLINIC_OR_DEPARTMENT_OTHER): Payer: Self-pay

## 2022-03-22 ENCOUNTER — Other Ambulatory Visit (HOSPITAL_BASED_OUTPATIENT_CLINIC_OR_DEPARTMENT_OTHER): Payer: Self-pay

## 2022-03-25 ENCOUNTER — Other Ambulatory Visit (HOSPITAL_BASED_OUTPATIENT_CLINIC_OR_DEPARTMENT_OTHER): Payer: Self-pay

## 2022-03-25 MED ORDER — AMOXICILLIN 500 MG PO CAPS
500.0000 mg | ORAL_CAPSULE | Freq: Three times a day (TID) | ORAL | 0 refills | Status: DC
  Filled 2022-03-25: qty 21, 7d supply, fill #0

## 2022-03-28 ENCOUNTER — Ambulatory Visit: Payer: 59 | Admitting: Family Medicine

## 2022-03-28 ENCOUNTER — Other Ambulatory Visit (HOSPITAL_BASED_OUTPATIENT_CLINIC_OR_DEPARTMENT_OTHER): Payer: Self-pay

## 2022-03-28 VITALS — BP 110/60 | HR 63 | Temp 98.4°F | Ht 66.0 in | Wt 255.4 lb

## 2022-03-28 DIAGNOSIS — H6991 Unspecified Eustachian tube disorder, right ear: Secondary | ICD-10-CM | POA: Diagnosis not present

## 2022-03-28 MED ORDER — FLUTICASONE PROPIONATE 50 MCG/ACT NA SUSP
1.0000 | Freq: Two times a day (BID) | NASAL | 6 refills | Status: AC
  Filled 2022-03-28: qty 16, 30d supply, fill #0
  Filled 2022-05-22: qty 16, 30d supply, fill #1
  Filled 2022-08-13 (×2): qty 16, 30d supply, fill #2

## 2022-03-28 NOTE — Patient Instructions (Signed)
Please follow up as scheduled for your next visit with me: 05/27/2022   If you have any questions or concerns, please don't hesitate to send me a message via MyChart or call the office at [phone]. Thank you for visiting with [name] today! It's our pleasure caring for you.  Eustachian Tube Dysfunction  Eustachian tube dysfunction refers to a condition in which a blockage develops in the narrow passage that connects the middle ear to the back of the nose (eustachian tube). The eustachian tube regulates air pressure in the middle ear by letting air move between the ear and nose. It also helps to drain fluid from the middle ear space. Eustachian tube dysfunction can affect one or both ears. When the eustachian tube does not function properly, air pressure, fluid, or both can build up in the middle ear. What are the causes? This condition occurs when the eustachian tube becomes blocked or cannot open normally. Common causes of this condition include: Ear infections. Colds and other infections that affect the nose, mouth, and throat (upper respiratory tract). Allergies. Irritation from cigarette smoke. Irritation from stomach acid coming up into the esophagus (gastroesophageal reflux). The esophagus is the part of the body that moves food from the mouth to the stomach. Sudden changes in air pressure, such as from descending in an airplane or scuba diving. Abnormal growths in the nose or throat, such as: Growths that line the nose (nasal polyps). Abnormal growth of cells (tumors). Enlarged tissue at the back of the throat (adenoids). What increases the risk? You are more likely to develop this condition if: You smoke. You are overweight. You are a child who has: Certain birth defects of the mouth, such as cleft palate. Large tonsils or adenoids. What are the signs or symptoms? Common symptoms of this condition include: A feeling of fullness in the ear. Ear pain. Clicking or popping noises in  the ear. Ringing in the ear (tinnitus). Hearing loss. Loss of balance. Dizziness. Symptoms may get worse when the air pressure around you changes, such as when you travel to an area of high elevation, fly on an airplane, or go scuba diving. How is this diagnosed? This condition may be diagnosed based on: Your symptoms. A physical exam of your ears, nose, and throat. Tests, such as those that measure: The movement of your eardrum. Your hearing (audiometry). How is this treated? Treatment depends on the cause and severity of your condition. In mild cases, you may relieve your symptoms by moving air into your ears. This is called "popping the ears." In more severe cases, or if you have symptoms of fluid in your ears, treatment may include: Medicines to relieve congestion (decongestants). Medicines that treat allergies (antihistamines). Nasal sprays or ear drops that contain medicines that reduce swelling (steroids). A procedure to drain the fluid in your eardrum. In this procedure, a small tube may be placed in the eardrum to: Drain the fluid. Restore the air in the middle ear space. A procedure to insert a balloon device through the nose to inflate the opening of the eustachian tube (balloon dilation). Follow these instructions at home: Lifestyle Do not do any of the following until your health care provider approves: Travel to high altitudes. Fly in airplanes. Work in a Pension scheme manager or room. Scuba dive. Do not use any products that contain nicotine or tobacco. These products include cigarettes, chewing tobacco, and vaping devices, such as e-cigarettes. If you need help quitting, ask your health care provider. Keep your ears  dry. Wear fitted earplugs during showering and bathing. Dry your ears completely after. General instructions Take over-the-counter and prescription medicines only as told by your health care provider. Use techniques to help pop your ears as recommended by  your health care provider. These may include: Chewing gum. Yawning. Frequent, forceful swallowing. Closing your mouth, holding your nose closed, and gently blowing as if you are trying to blow air out of your nose. Keep all follow-up visits. This is important. Contact a health care provider if: Your symptoms do not go away after treatment. Your symptoms come back after treatment. You are unable to pop your ears. You have: A fever. Pain in your ear. Pain in your head or neck. Fluid draining from your ear. Your hearing suddenly changes. You become very dizzy. You lose your balance. Get help right away if: You have a sudden, severe increase in any of your symptoms. Summary Eustachian tube dysfunction refers to a condition in which a blockage develops in the eustachian tube. It can be caused by ear infections, allergies, inhaled irritants, or abnormal growths in the nose or throat. Symptoms may include ear pain or fullness, hearing loss, or ringing in the ears. Mild cases are treated with techniques to unblock the ears, such as yawning or chewing gum. More severe cases are treated with medicines or procedures. This information is not intended to replace advice given to you by your health care provider. Make sure you discuss any questions you have with your health care provider. Document Revised: 07/17/2020 Document Reviewed: 07/17/2020 Elsevier Patient Education  [place].

## 2022-03-28 NOTE — Progress Notes (Signed)
Subjective  CC:  Chief Complaint  Patient presents with   clogged ear    Pt tated that she has had a clogged Rt ear for the past 2 weeks.    HPI: Alexandra Walker is a 65 y.o. female who presents to the office today to address the problems listed above in the chief complaint. Complains of fluid and hearing loss ear, intermittently.  If she lies on her right side at night, she can feel to fill up and then cannot hear.  If she rolls over the ear will pop.  No pain or pressure.  She denies allergy symptoms.  No rhinorrhea.  No sinus pain.  Has had obstructive cerumen impactions in the past.  Assessment  1. Dysfunction of right eustachian tube      Plan  Eustachian tube dysfunction plus minus allergies: Start Flonase twice a day, Zyrtec or Allegra and Sudafed.  If not improving over the next 4 to 6 weeks, can consider prednisone.  Patient understands and agrees with care plan  Follow up: prn  05/27/2022  No orders of the defined types were placed in this encounter.  No orders of the defined types were placed in this encounter.     I reviewed the patients updated PMH, FH, and SocHx.    Patient Active Problem List   Diagnosis Date Noted   Type 2 diabetes, diet controlled ([place]) 05/07/2021    Priority: High   Morbid obesity ([place]) 08/07/2020    Priority: High   Attention deficit hyperactivity disorder (ADHD), combined type 10/19/2017    Priority: High   OSA on CPAP, followed by Neurology/Sleep 09/26/2016    Priority: High   Essential hypertension 01/31/2016    Priority: High   Mixed hyperlipidemia 05/10/2015    Priority: High   Paroxysmal SVT (supraventricular tachycardia) 09/19/2011    Priority: High   Bilateral hip bursitis 05/06/2019    Priority: Medium    Nephrolithiasis 03/29/2018    Priority: Medium    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[place])     Priority: Medium    Recurrent circadian rhythm sleep disorder, shift work type 03/31/2017    Priority: Medium    IBS (irritable  bowel syndrome), diarrhea predominant 03/17/2017    Priority: Medium    Hepatic steatosis 09/26/2016    Priority: Medium    Diverticulosis of colon 05/16/2018    Priority: Low   Drug-induced pancreatitis, 2015, thought to be due to [facility] 03/29/2018    Priority: Low   History of vitreous detachment OU, HTN retinopathy OU, High myopia OU, Cataracts OU, Vitreous floaters OU, Keratoconjunctivitis sicca OU not specified as Sjogren's 03/29/2018    Priority: Low   History of sepsis 02/01/2016    Priority: Low   Vitamin D deficiency, on Vit D 2000 IU daily 05/10/2015    Priority: Low   Current Meds  Medication Sig   amoxicillin (AMOXIL) 500 MG capsule Take 1 capsule (500 mg total) by mouth 3 (three) times daily.   amphetamine-dextroamphetamine (ADDERALL) 10 MG tablet Take 1 tablet (10 mg total) by mouth daily as needed.   aspirin 325 MG tablet Take 325 mg by mouth daily.   cholecalciferol (VITAMIN D) 1000 UNITS tablet Take 2,000 Units by mouth daily.    hydrochlorothiazide (HYDRODIURIL) 25 MG tablet Take 1 tablet (25 mg total) by mouth daily.   influenza vac split quadrivalent PF (FLUARIX QUADRIVALENT) 0.5 ML injection Inject into the muscle.   losartan (COZAAR) 100 MG tablet TAKE 1 TABLET (100 MG TOTAL)  BY MOUTH DAILY.   metoprolol succinate (TOPROL-XL) 50 MG 24 hr tablet Take 1 tablet (50 mg total) by mouth daily with or immediately following a meal.   Omega-3 1000 MG CAPS Take 1 capsule by mouth daily.    rosuvastatin (CRESTOR) 10 MG tablet TAKE 1 TABLET (10 MG TOTAL) BY MOUTH DAILY.   Semaglutide,0.25 or 0.'5MG'$ /DOS, (OZEMPIC, 0.25 OR 0.5 MG/DOSE,) 2 MG/3ML SOPN Inject 0.5 mg into the skin once a week.   Turmeric 450 MG CAPS Take 450 mg by mouth daily.     Allergies: Patient is allergic to ace inhibitors, oxycodone-acetaminophen, vicodin [hydrocodone-acetaminophen], victoza [liraglutide], and adhesive [tape]. Family History: Patient family history includes Coronary artery disease in her  mother; Depression in her mother; Diabetes in her mother; Heart attack in her father and mother; Heart disease in her father and mother; Hyperlipidemia in her mother; Hypertension in her brother, mother, and sister; Kidney disease in her mother; Obesity in her mother; Sleep apnea in her mother; Sudden death in her father. Social History:  Patient  reports that she has never smoked. She has never used smokeless tobacco. She reports current alcohol use. She reports that she does not use drugs.  Review of Systems: Constitutional: Negative for fever malaise or anorexia Cardiovascular: negative for chest pain Respiratory: negative for SOB or persistent cough Gastrointestinal: negative for abdominal pain  Objective  Vitals: BP 110/60   Pulse 63   Temp 98.4 F (36.9 C)   Ht '5\' 6"'$  (1.676 m)   Wt 255 lb 6.4 oz (115.8 kg)   LMP 05/20/2008   SpO2 98%   BMI 41.22 kg/m  General: no acute distress , A&Ox3 HEENT:  conjunctiva normal, neck is supple, right TM is obstructed partially with cerumen, after irrigation, TM shows serous effusion, left TM shows the same.  No redness or bulging.  She    Commons side effects, risks, benefits, and alternatives for medications and treatment plan prescribed today were discussed, and the patient expressed understanding of the given instructions. Patient is instructed to call or message via MyChart if he/she has any questions or concerns regarding our treatment plan. No barriers to understanding were identified. We discussed Red Flag symptoms and signs in detail. Patient expressed understanding regarding what to do in case of urgent or emergency type symptoms.  Medication list was reconciled, printed and provided to the patient in AVS. Patient instructions and summary information was reviewed with the patient as documented in the AVS. This note was prepared with assistance of Dragon voice recognition software. Occasional wrong-word or sound-a-like substitutions may  have occurred due to the inherent limitations of voice recognition software  This visit occurred during the SARS-CoV-2 public health emergency.  Safety protocols were in place, including screening questions prior to the visit, additional usage of staff PPE, and extensive cleaning of exam room while observing appropriate contact time as indicated for disinfecting solutions.

## 2022-04-22 ENCOUNTER — Other Ambulatory Visit: Payer: Self-pay | Admitting: Family Medicine

## 2022-04-23 ENCOUNTER — Other Ambulatory Visit (HOSPITAL_BASED_OUTPATIENT_CLINIC_OR_DEPARTMENT_OTHER): Payer: Self-pay

## 2022-04-23 MED ORDER — ROSUVASTATIN CALCIUM 10 MG PO TABS
10.0000 mg | ORAL_TABLET | Freq: Every day | ORAL | 3 refills | Status: DC
  Filled 2022-04-23: qty 90, 90d supply, fill #0

## 2022-04-29 ENCOUNTER — Other Ambulatory Visit (HOSPITAL_BASED_OUTPATIENT_CLINIC_OR_DEPARTMENT_OTHER): Payer: Self-pay

## 2022-04-29 DIAGNOSIS — H1789 Other corneal scars and opacities: Secondary | ICD-10-CM | POA: Diagnosis not present

## 2022-04-29 DIAGNOSIS — H5203 Hypermetropia, bilateral: Secondary | ICD-10-CM | POA: Diagnosis not present

## 2022-04-29 DIAGNOSIS — H16223 Keratoconjunctivitis sicca, not specified as Sjogren's, bilateral: Secondary | ICD-10-CM | POA: Diagnosis not present

## 2022-04-29 DIAGNOSIS — H2513 Age-related nuclear cataract, bilateral: Secondary | ICD-10-CM | POA: Diagnosis not present

## 2022-04-29 DIAGNOSIS — H40023 Open angle with borderline findings, high risk, bilateral: Secondary | ICD-10-CM | POA: Diagnosis not present

## 2022-04-29 DIAGNOSIS — H52203 Unspecified astigmatism, bilateral: Secondary | ICD-10-CM | POA: Diagnosis not present

## 2022-04-29 DIAGNOSIS — H524 Presbyopia: Secondary | ICD-10-CM | POA: Diagnosis not present

## 2022-04-29 DIAGNOSIS — R7303 Prediabetes: Secondary | ICD-10-CM | POA: Diagnosis not present

## 2022-04-29 DIAGNOSIS — Z7984 Long term (current) use of oral hypoglycemic drugs: Secondary | ICD-10-CM | POA: Diagnosis not present

## 2022-04-29 LAB — HM DIABETES EYE EXAM

## 2022-04-29 MED ORDER — CYCLOSPORINE 0.05 % OP EMUL
OPHTHALMIC | 3 refills | Status: DC
  Filled 2022-04-29: qty 180, 90d supply, fill #0
  Filled 2022-08-13 – 2023-03-08 (×2): qty 180, 90d supply, fill #1

## 2022-05-07 ENCOUNTER — Other Ambulatory Visit (HOSPITAL_BASED_OUTPATIENT_CLINIC_OR_DEPARTMENT_OTHER): Payer: Self-pay

## 2022-05-07 DIAGNOSIS — M545 Low back pain, unspecified: Secondary | ICD-10-CM | POA: Diagnosis not present

## 2022-05-07 MED ORDER — PREDNISONE 5 MG PO TABS
ORAL_TABLET | ORAL | 0 refills | Status: DC
  Filled 2022-05-07: qty 21, 6d supply, fill #0

## 2022-05-07 MED ORDER — METHOCARBAMOL 500 MG PO TABS
500.0000 mg | ORAL_TABLET | Freq: Four times a day (QID) | ORAL | 0 refills | Status: DC | PRN
  Filled 2022-05-07: qty 20, 5d supply, fill #0

## 2022-05-15 ENCOUNTER — Encounter: Payer: Self-pay | Admitting: Internal Medicine

## 2022-05-22 ENCOUNTER — Other Ambulatory Visit: Payer: Self-pay | Admitting: Family Medicine

## 2022-05-22 ENCOUNTER — Other Ambulatory Visit (INDEPENDENT_AMBULATORY_CARE_PROVIDER_SITE_OTHER): Payer: Self-pay | Admitting: Family Medicine

## 2022-05-22 ENCOUNTER — Other Ambulatory Visit (HOSPITAL_BASED_OUTPATIENT_CLINIC_OR_DEPARTMENT_OTHER): Payer: Self-pay

## 2022-05-22 ENCOUNTER — Other Ambulatory Visit: Payer: Self-pay

## 2022-05-22 ENCOUNTER — Encounter (INDEPENDENT_AMBULATORY_CARE_PROVIDER_SITE_OTHER): Payer: Self-pay | Admitting: Family Medicine

## 2022-05-22 DIAGNOSIS — Z1231 Encounter for screening mammogram for malignant neoplasm of breast: Secondary | ICD-10-CM

## 2022-05-22 DIAGNOSIS — E1165 Type 2 diabetes mellitus with hyperglycemia: Secondary | ICD-10-CM

## 2022-05-27 ENCOUNTER — Encounter: Payer: Self-pay | Admitting: Family Medicine

## 2022-05-27 ENCOUNTER — Other Ambulatory Visit: Payer: Self-pay

## 2022-05-27 ENCOUNTER — Encounter: Payer: Self-pay | Admitting: Nurse Practitioner

## 2022-05-27 ENCOUNTER — Ambulatory Visit (INDEPENDENT_AMBULATORY_CARE_PROVIDER_SITE_OTHER): Payer: 59 | Admitting: Family Medicine

## 2022-05-27 ENCOUNTER — Ambulatory Visit: Payer: 59 | Admitting: Family Medicine

## 2022-05-27 ENCOUNTER — Other Ambulatory Visit (HOSPITAL_BASED_OUTPATIENT_CLINIC_OR_DEPARTMENT_OTHER): Payer: Self-pay | Admitting: Family Medicine

## 2022-05-27 ENCOUNTER — Other Ambulatory Visit (HOSPITAL_BASED_OUTPATIENT_CLINIC_OR_DEPARTMENT_OTHER): Payer: Self-pay

## 2022-05-27 VITALS — BP 122/79 | HR 77 | Temp 98.5°F | Ht 66.0 in | Wt 251.6 lb

## 2022-05-27 DIAGNOSIS — E782 Mixed hyperlipidemia: Secondary | ICD-10-CM

## 2022-05-27 DIAGNOSIS — I471 Supraventricular tachycardia, unspecified: Secondary | ICD-10-CM | POA: Diagnosis not present

## 2022-05-27 DIAGNOSIS — K760 Fatty (change of) liver, not elsewhere classified: Secondary | ICD-10-CM | POA: Diagnosis not present

## 2022-05-27 DIAGNOSIS — Z1231 Encounter for screening mammogram for malignant neoplasm of breast: Secondary | ICD-10-CM

## 2022-05-27 DIAGNOSIS — I10 Essential (primary) hypertension: Secondary | ICD-10-CM | POA: Diagnosis not present

## 2022-05-27 DIAGNOSIS — D6861 Antiphospholipid syndrome: Secondary | ICD-10-CM

## 2022-05-27 DIAGNOSIS — E119 Type 2 diabetes mellitus without complications: Secondary | ICD-10-CM

## 2022-05-27 DIAGNOSIS — F902 Attention-deficit hyperactivity disorder, combined type: Secondary | ICD-10-CM

## 2022-05-27 DIAGNOSIS — M461 Sacroiliitis, not elsewhere classified: Secondary | ICD-10-CM | POA: Insufficient documentation

## 2022-05-27 LAB — CBC WITH DIFFERENTIAL/PLATELET
Basophils Absolute: 0.1 10*3/uL (ref 0.0–0.1)
Basophils Relative: 1 % (ref 0.0–3.0)
Eosinophils Absolute: 0.3 10*3/uL (ref 0.0–0.7)
Eosinophils Relative: 4 % (ref 0.0–5.0)
HCT: 42.2 % (ref 36.0–46.0)
Hemoglobin: 14 g/dL (ref 12.0–15.0)
Lymphocytes Relative: 21.2 % (ref 12.0–46.0)
Lymphs Abs: 1.3 10*3/uL (ref 0.7–4.0)
MCHC: 33.2 g/dL (ref 30.0–36.0)
MCV: 83.2 fl (ref 78.0–100.0)
Monocytes Absolute: 0.5 10*3/uL (ref 0.1–1.0)
Monocytes Relative: 8.5 % (ref 3.0–12.0)
Neutro Abs: 4.1 10*3/uL (ref 1.4–7.7)
Neutrophils Relative %: 65.3 % (ref 43.0–77.0)
Platelets: 222 10*3/uL (ref 150.0–400.0)
RBC: 5.07 Mil/uL (ref 3.87–5.11)
RDW: 13.6 % (ref 11.5–15.5)
WBC: 6.3 10*3/uL (ref 4.0–10.5)

## 2022-05-27 LAB — COMPREHENSIVE METABOLIC PANEL
ALT: 20 U/L (ref 0–35)
AST: 17 U/L (ref 0–37)
Albumin: 4 g/dL (ref 3.5–5.2)
Alkaline Phosphatase: 71 U/L (ref 39–117)
BUN: 14 mg/dL (ref 6–23)
CO2: 26 mEq/L (ref 19–32)
Calcium: 9.3 mg/dL (ref 8.4–10.5)
Chloride: 102 mEq/L (ref 96–112)
Creatinine, Ser: 0.61 mg/dL (ref 0.40–1.20)
GFR: 94.61 mL/min (ref >60.00)
Glucose, Bld: 111 mg/dL — ABNORMAL HIGH (ref 70–99)
Potassium: 4.6 mEq/L (ref 3.5–5.1)
Sodium: 140 mEq/L (ref 135–145)
Total Bilirubin: 0.5 mg/dL (ref 0.2–1.2)
Total Protein: 6.4 g/dL (ref 6.0–8.3)

## 2022-05-27 LAB — LIPID PANEL
Cholesterol: 144 mg/dL (ref 0–200)
HDL: 38.6 mg/dL — ABNORMAL LOW (ref >39.00)
LDL Cholesterol: 75 mg/dL (ref 0–99)
NonHDL: 105.8
Total CHOL/HDL Ratio: 4
Triglycerides: 152 mg/dL — ABNORMAL HIGH (ref 0.0–149.0)
VLDL: 30.4 mg/dL (ref 0.0–40.0)

## 2022-05-27 LAB — POCT GLYCOSYLATED HEMOGLOBIN (HGB A1C): Hemoglobin A1C: 5.7 % — AB (ref 4.0–5.6)

## 2022-05-27 LAB — MICROALBUMIN / CREATININE URINE RATIO
Creatinine,U: 130.7 mg/dL
Microalb Creat Ratio: 1.4 mg/g (ref 0.0–30.0)
Microalb, Ur: 1.8 mg/dL (ref 0.0–1.9)

## 2022-05-27 LAB — TSH: TSH: 1.11 u[IU]/mL (ref 0.35–5.50)

## 2022-05-27 MED ORDER — AMPHETAMINE-DEXTROAMPHETAMINE 10 MG PO TABS
10.0000 mg | ORAL_TABLET | Freq: Every day | ORAL | 0 refills | Status: DC | PRN
  Filled 2022-05-27: qty 30, 30d supply, fill #0

## 2022-05-27 MED ORDER — SEMAGLUTIDE (1 MG/DOSE) 4 MG/3ML ~~LOC~~ SOPN
1.0000 mg | PEN_INJECTOR | SUBCUTANEOUS | 2 refills | Status: DC
  Filled 2022-05-27: qty 3, 28d supply, fill #0

## 2022-05-27 NOTE — Progress Notes (Signed)
Subjective  CC:  Chief Complaint  Patient presents with   Diabetes    Pt has mammo schedule for 07/12/2022    HPI: Alexandra Walker is a 67 y.o. female who presents to the office today for follow up of diabetes and problems listed above in the chief complaint.  Diabetes follow up: Her diabetic control is reported as Unchanged. On ozempic 0.5. weight loss has plateued. Trying intermittent fasting. No adverse effects She denies exertional CP or SOB or symptomatic hypoglycemia. She denies foot sores or paresthesias.  Wt Readings from Last 3 Encounters:  05/27/22 251 lb 9.6 oz (114.1 kg)  03/28/22 255 lb 6.4 oz (115.8 kg)  02/26/22 250 lb (113.4 kg)  HTN: well controlled on meds; has been lower but had difficult delivery yesterday (5 minute shoulder dystocia) and pt remains a bit upset, understandably ADD: has improved back on adderall.  Mood: some low motivation but better on adderall. No concerns with depressive sxs now Obesity: to see healthy weight and wellness again after a brief hiatus Hld on statin. Fasting for recheck   BP Readings from Last 3 Encounters:  05/27/22 122/79  03/28/22 110/60  02/26/22 104/70    Assessment  1. Controlled type 2 diabetes mellitus without complication, without long-term current use of insulin ([place])   2. Essential hypertension   3. Paroxysmal SVT (supraventricular tachycardia)   4. Attention deficit hyperactivity disorder (ADHD), combined type   5. Morbid obesity ([place])   6. 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HCC) Chronic  7. Hepatic steatosis   8. Mixed hyperlipidemia      Plan  Diabetes is currently very well controlled. Will increase ozempic to '1mg'$  weekly to help further with diabetes mgt and weight loss. Monitor for side effects.  Obesity: she will see healthy weight and wellness later this month.  ADD: improved on adderall. Refilled. Mood is stable HTN is controlled  To schedule 2nd shingrix vaccine at her convenience; discussed management of  side effects.  Recheck labs/lipids, lfts, cbc, renal, lytes.   I spent a total of 43 minutes for this patient encounter. Time spent included preparation, face-to-face counseling with the patient and coordination of care, review of chart and records, and documentation of the encounter.  Follow up: 3 mo for diabetes/weight loss and 6 mo for cpe. Orders Placed This Encounter  Procedures   Microalbumin / creatinine urine ratio   CBC with Differential/Platelet   Comprehensive metabolic panel   Lipid panel   TSH   POCT HgB A1C   Meds ordered this encounter  Medications   amphetamine-dextroamphetamine (ADDERALL) 10 MG tablet    Sig: Take 1 tablet (10 mg total) by mouth daily as needed.    Dispense:  30 tablet    Refill:  0   Semaglutide, 1 MG/DOSE, 4 MG/3ML SOPN    Sig: Inject 1 mg into the skin once a week.    Dispense:  3 mL    Refill:  2      Immunization History  Administered Date(s) Administered   Influenza, High Dose Seasonal PF 01/21/2013   Influenza,inj,Quad PF,6+ Mos 01/21/2013, 03/09/2018, 03/19/2019, 03/13/2021, 02/26/2022   Influenza,inj,quad, With Preservative 02/18/2015   Influenza-Unspecified 02/18/2015, 02/23/2016, 03/13/2017, 03/20/2020   Moderna Sars-Covid-2 Vaccination 04/20/2019   PFIZER(Purple Top)SARS-COV-2 Vaccination 05/15/2019, 06/05/2019   PNEUMOCOCCAL CONJUGATE-20 05/07/2021   Pneumococcal Polysaccharide-23 08/01/2008   Rabies Immune Globulin 10/14/2011, 10/17/2011, 10/23/2011, 10/31/2011   Rabies, IM 10/14/2011, 10/17/2011, 10/23/2011, 10/31/2011   Rabies, intradermal 10/14/2011   Tdap 06/08/2007, 11/01/2019  Zoster Recombinat (Shingrix) 11/19/2021    Diabetes Related Lab Review: Lab Results  Component Value Date   HGBA1C 5.7 (A) 05/27/2022   HGBA1C 6.2 (H) 09/03/2021   HGBA1C 6.3 (A) 05/07/2021    No results found for: "MICROALBUR", "[id number]" Lab Results  Component Value Date   CREATININE 0.71 09/03/2021   BUN 21 09/03/2021   NA 142  09/03/2021   K 4.0 09/03/2021   CL 102 09/03/2021   CO2 26 09/03/2021   Lab Results  Component Value Date   CHOL 134 09/03/2021   CHOL 145 05/24/2021   CHOL 129 11/13/2020   Lab Results  Component Value Date   HDL 39 (L) 09/03/2021   HDL 47 05/24/2021   HDL 40.70 11/13/2020   Lab Results  Component Value Date   LDLCALC 70 09/03/2021   LDLCALC 77 05/24/2021   LDLCALC 65 11/13/2020   Lab Results  Component Value Date   TRIG 141 09/03/2021   TRIG 114 05/24/2021   TRIG 118.0 11/13/2020   Lab Results  Component Value Date   CHOLHDL 3 11/13/2020   CHOLHDL 4 08/30/2019   CHOLHDL 5 05/24/2019   Lab Results  Component Value Date   LDLDIRECT 118.0 05/24/2019   The 10-year ASCVD risk score (Arnett DK, et al., 2019) is: 10.8%   Values used to calculate the score:     Age: 22 years     Sex: Female     Is Non-Hispanic African American: No     Diabetic: Yes     Tobacco smoker: No     Systolic Blood Pressure: 540 mmHg     Is BP treated: Yes     HDL Cholesterol: 39 mg/dL     Total Cholesterol: 134 mg/dL I have reviewed the PMH, Fam and Soc history. Patient Active Problem List   Diagnosis Date Noted   Controlled type 2 diabetes mellitus without complication, without long-term current use of insulin ([place]) 05/27/2022    Priority: High   Morbid obesity ([place]) 08/07/2020    Priority: High   Attention deficit hyperactivity disorder (ADHD), combined type 10/19/2017    Priority: High   OSA on CPAP, followed by Neurology/Sleep 09/26/2016    Priority: High    Dr. Brett Fairy. Sleep test repeat: 2023, mild, continue cpap. Auto titrated    Essential hypertension 01/31/2016    Priority: High   Mixed hyperlipidemia 05/10/2015    Priority: High   Paroxysmal SVT (supraventricular tachycardia) 09/19/2011    Priority: High    SVT/PACs controlled on BB; managed by cardiology. No CAD    Bilateral hip bursitis 05/06/2019    Priority: Medium    Nephrolithiasis 03/29/2018    Priority:  Medium     H/o recurrent pyelo/urosepsis; s/p stone retrieval and stenting.  Has nonobstructive stone that persists.  Dr. Tresa Moore    Anticardiolipin syndrome [facility])     Priority: Medium    Recurrent circadian rhythm sleep disorder, shift work type 03/31/2017    Priority: Medium    IBS (irritable bowel syndrome), diarrhea predominant 03/17/2017    Priority: Medium    Hepatic steatosis 09/26/2016    Priority: Medium    Diverticulosis of colon 05/16/2018    Priority: Low   Drug-induced pancreatitis, 2015, thought to be due to [facility] 03/29/2018    Priority: Low   History of vitreous detachment OU, HTN retinopathy OU, High myopia OU, Cataracts OU, Vitreous floaters OU, Keratoconjunctivitis sicca OU not specified as Sjogren's 03/29/2018    Priority: Low  History of sepsis 02/01/2016    Priority: Low    Due to urosepsis: had acute short lived afib/psvt. Neg cardiac eval since.     Vitamin D deficiency, on Vit D 2000 IU daily 05/10/2015    Priority: Low   Sacroiliitis ([place]) 05/27/2022    Social History: Patient  reports that she has never smoked. She has never used smokeless tobacco. She reports current alcohol use. She reports that she does not use drugs.  Review of Systems: Ophthalmic: negative for eye pain, loss of vision or double vision Cardiovascular: negative for chest pain Respiratory: negative for SOB or persistent cough Gastrointestinal: negative for abdominal pain Genitourinary: negative for dysuria or gross hematuria MSK: negative for foot lesions Neurologic: negative for weakness or gait disturbance  Objective  Vitals: BP 122/79   Pulse 77   Temp 98.5 F (36.9 C)   Ht '5\' 6"'$  (1.676 m)   Wt 251 lb 9.6 oz (114.1 kg)   LMP 05/20/2008   SpO2 97%   BMI 40.61 kg/m  General: well appearing, no acute distress  Psych:  Alert and oriented, normal mood and affect HEENT:  Normocephalic, atraumatic, moist mucous membranes, supple neck  Cardiovascular:  Nl S1 and S2, RRR  without murmur, gallop or rub. no edema Respiratory:  Good breath sounds bilaterally, CTAB with normal effort, no rales   Diabetic education: ongoing education regarding chronic disease management for diabetes was given today. We continue to reinforce the ABC's of diabetic management: A1c (<7 or 8 dependent upon patient), tight blood pressure control, and cholesterol management with goal LDL < 100 minimally. We discuss diet strategies, exercise recommendations, medication options and possible side effects. At each visit, we review recommended immunizations and preventive care recommendations for diabetics and stress that good diabetic control can prevent other problems. See below for this patient's data.   Commons side effects, risks, benefits, and alternatives for medications and treatment plan prescribed today were discussed, and the patient expressed understanding of the given instructions. Patient is instructed to call or message via MyChart if he/she has any questions or concerns regarding our treatment plan. No barriers to understanding were identified. We discussed Red Flag symptoms and signs in detail. Patient expressed understanding regarding what to do in case of urgent or emergency type symptoms.  Medication list was reconciled, printed and provided to the patient in AVS. Patient instructions and summary information was reviewed with the patient as documented in the AVS. This note was prepared with assistance of Dragon voice recognition software. Occasional wrong-word or sound-a-like substitutions may have occurred due to the inherent limitations of voice recognition software

## 2022-05-27 NOTE — Patient Instructions (Signed)
Please return in 3 months for diabetes and weight follow and in 6 months for your complete physical. You may schedule a nurse visit to get your 2nd shingrix vaccination when convenient.   If you have any questions or concerns, please don't hesitate to send me a message via MyChart or call the office at [phone]. Thank you for visiting with Alexandra Walker today! It's our pleasure caring for you.  Please call Dr. Celesta Aver office to get your colonoscopy scheduled: it was due in December.  [phone]

## 2022-05-28 ENCOUNTER — Other Ambulatory Visit: Payer: Self-pay

## 2022-05-30 ENCOUNTER — Encounter: Payer: Self-pay | Admitting: Family Medicine

## 2022-05-31 ENCOUNTER — Ambulatory Visit (INDEPENDENT_AMBULATORY_CARE_PROVIDER_SITE_OTHER): Payer: 59

## 2022-05-31 ENCOUNTER — Other Ambulatory Visit (HOSPITAL_BASED_OUTPATIENT_CLINIC_OR_DEPARTMENT_OTHER): Payer: Self-pay

## 2022-05-31 DIAGNOSIS — Z23 Encounter for immunization: Secondary | ICD-10-CM | POA: Diagnosis not present

## 2022-05-31 MED ORDER — ROSUVASTATIN CALCIUM 20 MG PO TABS
20.0000 mg | ORAL_TABLET | Freq: Every day | ORAL | 3 refills | Status: DC
  Filled 2022-05-31: qty 90, 90d supply, fill #0
  Filled 2022-08-04 – 2022-08-13 (×3): qty 90, 90d supply, fill #1
  Filled 2023-01-27: qty 90, 90d supply, fill #2
  Filled 2023-05-17: qty 90, 90d supply, fill #3

## 2022-05-31 NOTE — Progress Notes (Signed)
Alexandra Walker 67 yr old female presents to office today for second shingles vaccine per Billey Chang, MD. Administered SHINGRIX 0.5 mL IM right arm. Patient tolerated well.

## 2022-06-10 ENCOUNTER — Encounter (HOSPITAL_BASED_OUTPATIENT_CLINIC_OR_DEPARTMENT_OTHER): Payer: Self-pay

## 2022-06-10 ENCOUNTER — Ambulatory Visit (HOSPITAL_BASED_OUTPATIENT_CLINIC_OR_DEPARTMENT_OTHER)
Admission: RE | Admit: 2022-06-10 | Discharge: 2022-06-10 | Disposition: A | Discharge status: Home / Self Care | Payer: 59 | Source: Ambulatory Visit | Attending: Family Medicine | Admitting: Family Medicine

## 2022-06-10 DIAGNOSIS — Z1231 Encounter for screening mammogram for malignant neoplasm of breast: Secondary | ICD-10-CM | POA: Diagnosis not present

## 2022-06-11 ENCOUNTER — Other Ambulatory Visit: Payer: Self-pay | Admitting: Family Medicine

## 2022-06-11 ENCOUNTER — Encounter: Payer: Self-pay | Admitting: Family Medicine

## 2022-06-11 DIAGNOSIS — N6310 Unspecified lump in the right breast, unspecified quadrant: Secondary | ICD-10-CM

## 2022-06-11 NOTE — Telephone Encounter (Signed)
Patient requests to be called at ph# [phone] to discuss Mammogram result and schedule Ultrasound

## 2022-06-12 ENCOUNTER — Other Ambulatory Visit: Payer: Self-pay

## 2022-06-12 DIAGNOSIS — R928 Other abnormal and inconclusive findings on diagnostic imaging of breast: Secondary | ICD-10-CM

## 2022-06-17 ENCOUNTER — Other Ambulatory Visit (HOSPITAL_BASED_OUTPATIENT_CLINIC_OR_DEPARTMENT_OTHER): Payer: Self-pay

## 2022-06-17 ENCOUNTER — Encounter (INDEPENDENT_AMBULATORY_CARE_PROVIDER_SITE_OTHER): Payer: Self-pay | Admitting: Family Medicine

## 2022-06-17 ENCOUNTER — Encounter: Payer: Self-pay | Admitting: Nurse Practitioner

## 2022-06-17 ENCOUNTER — Ambulatory Visit (INDEPENDENT_AMBULATORY_CARE_PROVIDER_SITE_OTHER): Payer: 59 | Admitting: Family Medicine

## 2022-06-17 ENCOUNTER — Ambulatory Visit (INDEPENDENT_AMBULATORY_CARE_PROVIDER_SITE_OTHER): Payer: 59 | Admitting: Nurse Practitioner

## 2022-06-17 ENCOUNTER — Telehealth (INDEPENDENT_AMBULATORY_CARE_PROVIDER_SITE_OTHER): Payer: Self-pay

## 2022-06-17 VITALS — BP 120/68 | HR 72 | Ht 66.0 in | Wt 249.0 lb

## 2022-06-17 VITALS — BP 133/74 | HR 70 | Temp 98.7°F | Ht 66.0 in

## 2022-06-17 DIAGNOSIS — E1165 Type 2 diabetes mellitus with hyperglycemia: Secondary | ICD-10-CM

## 2022-06-17 DIAGNOSIS — E119 Type 2 diabetes mellitus without complications: Secondary | ICD-10-CM

## 2022-06-17 DIAGNOSIS — E669 Obesity, unspecified: Secondary | ICD-10-CM | POA: Diagnosis not present

## 2022-06-17 DIAGNOSIS — Z1211 Encounter for screening for malignant neoplasm of colon: Secondary | ICD-10-CM

## 2022-06-17 DIAGNOSIS — Z6841 Body Mass Index (BMI) 40.0 and over, adult: Secondary | ICD-10-CM | POA: Diagnosis not present

## 2022-06-17 DIAGNOSIS — E785 Hyperlipidemia, unspecified: Secondary | ICD-10-CM

## 2022-06-17 DIAGNOSIS — Z7985 Long-term (current) use of injectable non-insulin antidiabetic drugs: Secondary | ICD-10-CM

## 2022-06-17 DIAGNOSIS — E1169 Type 2 diabetes mellitus with other specified complication: Secondary | ICD-10-CM | POA: Diagnosis not present

## 2022-06-17 DIAGNOSIS — R1310 Dysphagia, unspecified: Secondary | ICD-10-CM

## 2022-06-17 MED ORDER — NA SULFATE-K SULFATE-MG SULF 17.5-3.13-1.6 GM/177ML PO SOLN
1.0000 | Freq: Once | ORAL | 0 refills | Status: AC
  Filled 2022-06-17: qty 354, 1d supply, fill #0

## 2022-06-17 MED ORDER — TIRZEPATIDE 2.5 MG/0.5ML ~~LOC~~ SOAJ
2.5000 mg | SUBCUTANEOUS | 0 refills | Status: DC
  Filled 2022-06-17: qty 2, 28d supply, fill #0

## 2022-06-17 NOTE — Telephone Encounter (Signed)
PA started 06/17/22

## 2022-06-17 NOTE — Patient Instructions (Addendum)
You have been scheduled for an endoscopy and colonoscopy. Please follow the written instructions given to you at your visit today. Please pick up your prep supplies at the pharmacy within the next 1-3 days. If you use inhalers (even only as needed), please bring them with you on the day of your procedure.  Due to recent changes in healthcare laws, you may see the results of your imaging and laboratory studies on MyChart before your provider has had a chance to review them.  We understand that in some cases there may be results that are confusing or concerning to you. Not all laboratory results come back in the same time frame and the provider may be waiting for multiple results in order to interpret others.  Please give us 48 hours in order for your provider to thoroughly review all the results before contacting the office for clarification of your results.    Thank you for trusting me with your gastrointestinal care!   Colleen Kennedy-Smith, CRNP   

## 2022-06-17 NOTE — Progress Notes (Signed)
06/17/2022 Alexandra Walker [id number] Nov 02, 1955   CHIEF COMPLAINT: Discuss scheduling an EGD and colonoscopy   HISTORY OF PRESENT ILLNESS: Alexandra Walker is a 67 year old female with a past medical history of depression, hypertension, hyperlipidemia, diabetes mellitus type 2, sleep hypopnea, brief A-fib during hospitalization secondary to pyonephritis/sepsis 2017, anticardiolipin antibody positive, hepatic steatosis, GERD, IBS, diverticulosis , and ischemic colitis. She presents today to schedule a colonoscopy and EGD.  She describes having food which gets stuck to the mid esophagus which occurs on and off for the past 20 years and typically happens if she eats too quickly.  She drinks water and the stuck food passes.  She also describes having food regurgitation once or twice monthly which occurs if she eats fatty foods or a larger meal.  She vomits harshly digested food once monthly which she contributed to [place] which was recently discontinued and she was started on Mounjaro.  She has a history of IBS-D which mostly resolved when she went on a lactose-free diet.  She is passing and formed brown stool most days.  She has diarrhea if she does not watch what she eats, if she eats heavy foods.  No rectal bleeding or black stools.  She takes Toprol for history of PACs.  No chest pain.  He recently underwent a mammogram which showed an abnormality which she requires further evaluation.  She takes aspirin 325 mg once daily secondary to having anticardiolipin antibodies.  She underwent an EGD 05/18/2012 which identified a Schatzki's ring at the gastroesophageal junction which was dilated with a 54 [place] Maloney dilator.  A colonoscopy was done on the same date identified diverticulosis to the sigmoid colon and a 5 mm polyp was removed from the cecum, path report showed a benign polypoid colonic mucosa and not a true colon polyp.  Random colon biopsies were negative for microscopic colitis/ischemic  colitis or IBD.  She was advised to repeat a colonoscopy in 10 years.  She reports a history of hypopnea for which she uses CPAP.  She describes breathing shallow upon awakening from anesthesia/sedation.    EGD/colonoscopy 05/18/2012. EGD was done first, followed by a colonoscopy. Colonoscopy report noted the following:   Past Medical History:  Diagnosis Date   ADD (attention deficit disorder)    Allergy    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HCC)    Bilateral swelling of feet    Cholelithiasis    Chronic depressive disorder    Circadian rhythm sleep disorder, shift work    DDD (degenerative disc disease), 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   Diabetes ([place])    Diverticulosis    Minimal   Fatty liver    GERD (gastroesophageal reflux disease)    Hepatic steatosis    History of hiatal hernia 2017   Small   HLA B27 (HLA B27 positive)    Hx of pancreatitis    Hyperlipidemia    Hypertension    IBS (irritable bowel syndrome)    Ischemic colitis ([place])    Lactose intolerance    Lumbar scoliosis 2010   Mild   Migraine headache without aura 05/10/2015   Nephrolithiasis 2014   Left, minimal   Obesity due to excess calories with serious comorbidity 05/24/2019   Palpitations    Prediabetes    Presbyopia    Right bundle branch block    Sepsis ([place]) 01/2016   Sleep hypopnea    Vitamin D deficiency    Past Surgical History:  Procedure Laterality Date   BLADDER SURGERY  BREAST BIOPSY Left    CERVICAL CONE BIOPSY     CHOLECYSTECTOMY N/A 10/26/2014   Procedure: LAPAROSCOPIC CHOLECYSTECTOMY;  Surgeon: Coralie Keens, MD;  Location: [facility];  Service: General;  Laterality: N/A;   COLONOSCOPY W/ BIOPSIES  multiple   CYSTOSCOPY WITH RETROGRADE PYELOGRAM, URETEROSCOPY AND STENT PLACEMENT Left 06/26/2018   Procedure: CYSTOSCOPY WITH RETROGRADE PYELOGRAM, URETEROSCOPY AND STENT PLACEMENT;  Surgeon: Alexis Frock, MD;  Location: [facility];  Service: Urology;  Laterality:  Left;   DIAGNOSTIC LAPAROSCOPY     DILATION AND CURETTAGE OF UTERUS  11/2009   DUPUYTREN CONTRACTURE RELEASE Left    x 3 , Dr Amedeo Plenty   ESOPHAGOGASTRODUODENOSCOPY  multiple   EYE SURGERY     HOLMIUM LASER APPLICATION Left 81/44/8185   Procedure: HOLMIUM LASER APPLICATION;  Surgeon: Alexis Frock, MD;  Location: [facility];  Service: Urology;  Laterality: Left;   KNEE ARTHROSCOPY Left 11/2008   LAPAROSCOPIC VAGINAL HYSTERECTOMY WITH SALPINGO OOPHORECTOMY Bilateral 10/03/2015   Procedure: LAPAROSCOPIC ASSISTED VAGINAL HYSTERECTOMY WITH SALPINGECTOMY;  Surgeon: Eldred Manges, MD;  Location: [facility];  Service: Gynecology;  Laterality: Bilateral;   LASIK  1990   RK   LEFT FINGER SURGERY     LIVER BIOPSY     MASS EXCISION Left 12/24/2012   Procedure: LEFT EXCISION OF PALMAR MASS X TWO;  Surgeon: Roseanne Kaufman, MD;  Location: [facility];  Service: Orthopedics;  Laterality: Left;   [place]   Social History: She is divorced.  She has 1 daughter.  She is a Chief Executive Officer.  Non-smoker.  She drinks 1 alcoholic beverage weekly or less.  No drug use.  Family History: family history includes Coronary artery disease in her mother; Depression in her mother; Diabetes in her mother; Heart attack in her father and mother; Heart disease in her father and mother; Hyperlipidemia in her mother; Hypertension in her brother, mother, and sister; Kidney disease in her mother; Obesity in her mother; Sleep apnea in her mother; Sudden death in her father.  Paternal grandfather had multiple myeloma.  No known family history of esophageal, gastric or colon cancer.    Outpatient Encounter Medications as of 06/17/2022  Medication Sig   amphetamine-dextroamphetamine (ADDERALL) 10 MG tablet Take 1 tablet (10 mg total) by mouth daily as needed.   aspirin 325 MG tablet Take 325 mg by mouth daily.   cholecalciferol (VITAMIN D) 1000 UNITS tablet  Take 2,000 Units by mouth daily.    cycloSPORINE (RESTASIS) 0.05 % ophthalmic emulsion Place 1 drop into both eyes 2 times daily.   diclofenac sodium (VOLTAREN) 1 % GEL    fluticasone (FLONASE) 50 MCG/ACT nasal spray Place 1 spray into both nostrils in the morning and at bedtime.   hydrochlorothiazide (HYDRODIURIL) 25 MG tablet Take 1 tablet (25 mg total) by mouth daily.   losartan (COZAAR) 100 MG tablet TAKE 1 TABLET (100 MG TOTAL) BY MOUTH DAILY.   methocarbamol (ROBAXIN) 500 MG tablet Take 1 tablet (500 mg total) by mouth 4 (four) times daily as needed for 5 days.   metoprolol succinate (TOPROL-XL) 50 MG 24 hr tablet Take 1 tablet (50 mg total) by mouth daily with or immediately following a meal.   Omega-3 1000 MG CAPS Take 1 capsule by mouth daily.    rosuvastatin (CRESTOR) 20 MG tablet Take 1 tablet (20 mg total) by mouth daily.   tirzepatide [facility]) 2.5 MG/0.5ML Pen Inject 2.5 mg  into the skin once a week.   Turmeric 450 MG CAPS Take 450 mg by mouth daily.    No facility-administered encounter medications on file as of 06/17/2022.    REVIEW OF SYSTEMS:  Gen: Denies fever, sweats or chills. No weight loss.  CV: Denies chest pain, palpitations or edema. Resp: Denies cough, shortness of breath of hemoptysis.  GI: See HPI.  GU : Denies urinary burning, blood in urine, increased urinary frequency or incontinence. MS: Denies joint pain, muscles aches or weakness. Derm: Denies rash, itchiness, skin lesions or unhealing ulcers. Psych: + Prior history of depression.  Heme: Denies bruising, easy bleeding. Neuro:  Denies headaches, dizziness or paresthesias. Endo:  + DM II.   PHYSICAL EXAM: BP 120/68   Pulse 72   Ht '5\' 6"'$  (1.676 m)   Wt 249 lb (112.9 kg)   LMP 05/20/2008   BMI 40.19 kg/m  General: 67 year old female in no acute distress. Head: Normocephalic and atraumatic. Eyes:  Sclerae non-icteric, conjunctive pink. Ears: Normal auditory acuity. Mouth: Dentition intact. No  ulcers or lesions.  Neck: Supple, no lymphadenopathy or thyromegaly.  Lungs: Clear bilaterally to auscultation without wheezes, crackles or rhonchi. Heart: Regular rate and rhythm. No murmur, rub or gallop appreciated.  Abdomen: Soft, nontender, nondistended. No masses. No hepatosplenomegaly. Normoactive bowel sounds x 4 quadrants.  Rectal: Deferred.  Musculoskeletal: Symmetrical with no gross deformities. Skin: Warm and dry. No rash or lesions on visible extremities. Extremities: No edema. Neurological: Alert oriented x 4, no focal deficits.  Psychological:  Alert and cooperative. Normal mood and affect.  ASSESSMENT AND PLAN:  25) 67 year old female with chronic intermittent esophageal dysphagia and regurgitation. -EGD with possible esophageal dilatation benefits and risks discussed including risk with sedation, risk of bleeding, perforation and infection  -Patient instructed to avoid eating large pieces of bread/steak/chicken or rice.  Cut food into small pieces and chew food thoroughly. -Patient to contact office if symptoms worsen prior to procedure date  2) Colon cancer screening.  Colonoscopy in 2013 showed diverticulosis and 1 polyp was removed from the cecum, pathology showed benign colonic mucosa.  3) History of hypopnea uses CPAP, patient describes breathing shallow upon awakening from sedation/anesthesia.  EGD/colonoscopy 05/18/2012. EGD was done first, followed by a colonoscopy. Colonoscopy report noted the following:  I will message Osvaldo Angst CRNA to review anesthesia record 04/2012.   Further recommendations to be determined after the above evaluation completed       CC:  Leamon Arnt, MD

## 2022-06-18 NOTE — Progress Notes (Signed)
Alexandra Angst, CRNA  Alexandra Pick, NP Alexandra Walker,  This pt is cleared for anesthetic care at [facility].  Thanks,  Alexandra Walker

## 2022-06-19 ENCOUNTER — Other Ambulatory Visit: Payer: Self-pay | Admitting: Family Medicine

## 2022-06-19 ENCOUNTER — Encounter (INDEPENDENT_AMBULATORY_CARE_PROVIDER_SITE_OTHER): Payer: Self-pay

## 2022-06-19 ENCOUNTER — Ambulatory Visit
Admission: RE | Admit: 2022-06-19 | Discharge: 2022-06-19 | Disposition: A | Discharge status: Home / Self Care | Payer: 59 | Source: Ambulatory Visit | Attending: Family Medicine | Admitting: Family Medicine

## 2022-06-19 DIAGNOSIS — N6001 Solitary cyst of right breast: Secondary | ICD-10-CM

## 2022-06-19 DIAGNOSIS — N6310 Unspecified lump in the right breast, unspecified quadrant: Secondary | ICD-10-CM

## 2022-06-21 ENCOUNTER — Ambulatory Visit
Admission: RE | Admit: 2022-06-21 | Discharge: 2022-06-21 | Disposition: A | Discharge status: Home / Self Care | Payer: 59 | Source: Ambulatory Visit | Attending: Family Medicine | Admitting: Family Medicine

## 2022-06-21 DIAGNOSIS — N6001 Solitary cyst of right breast: Secondary | ICD-10-CM

## 2022-06-26 NOTE — Progress Notes (Signed)
Chief Complaint:   OBESITY Alexandra Walker is here to discuss her progress with her obesity treatment plan along with follow-up of her obesity related diagnoses. Alexandra Walker is on keeping a food journal and adhering to recommended goals of 1540-1600 calories and 95 grams of protein and states she is following her eating plan approximately 60-70% of the time. Alexandra Walker states she is weight/vibration plate 30 minutes 3-4 times per week.  Today's visit was #: 13 Starting weight: 265 lbs Starting date: 05/24/2021 Today's weight: 248 lbs Today's date: 06/17/2022 Total lbs lost to date: 17 lbs Total lbs lost since last in-office visit: 2  Interim History: Alexandra Walker got a new vibration plate and is doing some weight training on visits.  She is a bit more stressed due to a coworker being diagnosed with breast cancer.  She is doing intermittent fasting and eating 16-18; 8-6.  Trying to focus on protein intake when eating and is using chicken and shakes.  Subjective:   1. Type 2 diabetes mellitus with hyperglycemia, without long-term current use of insulin ([place]) Alexandra Walker is on Ozempic 1 mg currently minimal to no side effects.  Last A1c at 5.7.  2. Hyperlipidemia associated with type 2 diabetes mellitus ([place]) Alexandra Walker is on a statin with increased in dose currently.  No side effects noted.  Assessment/Plan:   1. Type 2 diabetes mellitus with hyperglycemia, without long-term current use of insulin ([place]) Stop Ozempic.  Start Mounjaro 2.5 MGs subcu once a week for 1 month with 0 refills.  -Start tirzepatide [facility] 2.5 MG/0.5ML Pen; Inject 2.5 mg into the skin once a week.  Dispense: 2 mL; Refill: 0  2. Hyperlipidemia associated with type 2 diabetes mellitus ([place]) Continue increased dose of statin without change in treatment.  Will repeat labs in 3 months.  3. Obesity with current BMI of 40.1 Alexandra Walker is currently in the action stage of change. As such, her goal is to continue with weight loss efforts. She has agreed  to keeping a food journal and adhering to recommended goals of 1540-1600 calories and 95+ grams of protein daily.   Exercise goals: All adults should avoid inactivity. Some physical activity is better than none, and adults who participate in any amount of physical activity gain some health benefits.  Behavioral modification strategies: increasing lean protein intake, meal planning and cooking strategies, keeping healthy foods in the home, planning for success, and keeping a strict food journal.  Alexandra Walker has agreed to follow-up with our clinic in 12 weeks. She was informed of the importance of frequent follow-up visits to maximize her success with intensive lifestyle modifications for her multiple health conditions.   Objective:   Blood pressure 133/74, pulse 70, temperature 98.7 F (37.1 C), height 5' 6"$  (1.676 m), last menstrual period 05/20/2008, SpO2 97 %. Body mass index is 40.61 kg/m.  General: Cooperative, alert, well developed, in no acute distress. HEENT: Conjunctivae and lids unremarkable. Cardiovascular: Regular rhythm.  Lungs: Normal work of breathing. Neurologic: No focal deficits.   Lab Results  Component Value Date   CREATININE 0.61 05/27/2022   BUN 14 05/27/2022   NA 140 05/27/2022   K 4.6 05/27/2022   CL 102 05/27/2022   CO2 26 05/27/2022   Lab Results  Component Value Date   ALT 20 05/27/2022   AST 17 05/27/2022   ALKPHOS 71 05/27/2022   BILITOT 0.5 05/27/2022   Lab Results  Component Value Date   HGBA1C 5.7 (A) 05/27/2022   HGBA1C 6.2 (H) 09/03/2021  HGBA1C 6.3 (A) 05/07/2021   HGBA1C 6.8 (H) 11/13/2020   HGBA1C 6.0 (H) 05/05/2020   Lab Results  Component Value Date   INSULIN 14.6 09/03/2021   INSULIN 12.5 05/24/2021   INSULIN 12.5 03/13/2017   INSULIN 10.0 10/28/2016   Lab Results  Component Value Date   TSH 1.11 05/27/2022   Lab Results  Component Value Date   CHOL 144 05/27/2022   HDL 38.60 (L) 05/27/2022   LDLCALC 75 05/27/2022    LDLDIRECT 118.0 05/24/2019   TRIG 152.0 (H) 05/27/2022   CHOLHDL 4 05/27/2022   Lab Results  Component Value Date   VD25OH 46.6 09/03/2021   VD25OH 50.7 05/24/2021   VD25OH 58.00 11/13/2020   Lab Results  Component Value Date   WBC 6.3 05/27/2022   HGB 14.0 05/27/2022   HCT 42.2 05/27/2022   MCV 83.2 05/27/2022   PLT 222.0 05/27/2022   No results found for: "IRON", "TIBC", "FERRITIN"  Attestation Statements:   Reviewed by clinician on day of visit: allergies, medications, problem list, medical history, surgical history, family history, social history, and previous encounter notes.  I, Elnora Morrison, RMA am acting as transcriptionist for Coralie Common, MD.  I have reviewed the above documentation for accuracy and completeness, and I agree with the above. - Coralie Common, MD

## 2022-07-08 DIAGNOSIS — D485 Neoplasm of uncertain behavior of skin: Secondary | ICD-10-CM | POA: Diagnosis not present

## 2022-07-08 DIAGNOSIS — L814 Other melanin hyperpigmentation: Secondary | ICD-10-CM | POA: Diagnosis not present

## 2022-07-08 DIAGNOSIS — D225 Melanocytic nevi of trunk: Secondary | ICD-10-CM | POA: Diagnosis not present

## 2022-07-08 DIAGNOSIS — C44629 Squamous cell carcinoma of skin of left upper limb, including shoulder: Secondary | ICD-10-CM | POA: Diagnosis not present

## 2022-07-08 DIAGNOSIS — L578 Other skin changes due to chronic exposure to nonionizing radiation: Secondary | ICD-10-CM | POA: Diagnosis not present

## 2022-07-08 DIAGNOSIS — L821 Other seborrheic keratosis: Secondary | ICD-10-CM | POA: Diagnosis not present

## 2022-07-09 ENCOUNTER — Other Ambulatory Visit: Payer: Self-pay

## 2022-07-09 ENCOUNTER — Emergency Department (HOSPITAL_BASED_OUTPATIENT_CLINIC_OR_DEPARTMENT_OTHER)
Admission: EM | Admit: 2022-07-09 | Discharge: 2022-07-10 | Disposition: A | Discharge status: Home / Self Care | Payer: 59 | Attending: Emergency Medicine | Admitting: Emergency Medicine

## 2022-07-09 ENCOUNTER — Emergency Department (HOSPITAL_BASED_OUTPATIENT_CLINIC_OR_DEPARTMENT_OTHER): Payer: 59

## 2022-07-09 ENCOUNTER — Encounter (HOSPITAL_BASED_OUTPATIENT_CLINIC_OR_DEPARTMENT_OTHER): Payer: Self-pay | Admitting: Emergency Medicine

## 2022-07-09 DIAGNOSIS — E119 Type 2 diabetes mellitus without complications: Secondary | ICD-10-CM | POA: Insufficient documentation

## 2022-07-09 DIAGNOSIS — Z20822 Contact with and (suspected) exposure to covid-19: Secondary | ICD-10-CM | POA: Diagnosis not present

## 2022-07-09 DIAGNOSIS — Z7982 Long term (current) use of aspirin: Secondary | ICD-10-CM | POA: Insufficient documentation

## 2022-07-09 DIAGNOSIS — I491 Atrial premature depolarization: Secondary | ICD-10-CM | POA: Diagnosis not present

## 2022-07-09 DIAGNOSIS — R0602 Shortness of breath: Secondary | ICD-10-CM | POA: Diagnosis not present

## 2022-07-09 DIAGNOSIS — Z79899 Other long term (current) drug therapy: Secondary | ICD-10-CM | POA: Insufficient documentation

## 2022-07-09 DIAGNOSIS — I10 Essential (primary) hypertension: Secondary | ICD-10-CM | POA: Diagnosis not present

## 2022-07-09 DIAGNOSIS — R008 Other abnormalities of heart beat: Secondary | ICD-10-CM

## 2022-07-09 DIAGNOSIS — R002 Palpitations: Secondary | ICD-10-CM | POA: Diagnosis not present

## 2022-07-09 DIAGNOSIS — I493 Ventricular premature depolarization: Secondary | ICD-10-CM | POA: Diagnosis not present

## 2022-07-09 DIAGNOSIS — I4891 Unspecified atrial fibrillation: Secondary | ICD-10-CM | POA: Diagnosis not present

## 2022-07-09 LAB — BASIC METABOLIC PANEL
Anion gap: 7 (ref 5–15)
BUN: 16 mg/dL (ref 8–23)
CO2: 25 mmol/L (ref 22–32)
Calcium: 8.8 mg/dL — ABNORMAL LOW (ref 8.9–10.3)
Chloride: 104 mmol/L (ref 98–111)
Creatinine, Ser: 0.75 mg/dL (ref 0.44–1.00)
GFR, Estimated: >60 mL/min (ref >60)
Glucose, Bld: 141 mg/dL — ABNORMAL HIGH (ref 70–99)
Potassium: 3.5 mmol/L (ref 3.5–5.1)
Sodium: 136 mmol/L (ref 135–145)

## 2022-07-09 LAB — TROPONIN I (HIGH SENSITIVITY)
Troponin I (High Sensitivity): 4 ng/L (ref <18)
Troponin I (High Sensitivity): 5 ng/L (ref <18)

## 2022-07-09 LAB — RESP PANEL BY RT-PCR (RSV, FLU A&B, COVID)  RVPGX2
Influenza A by PCR: NEGATIVE
Influenza B by PCR: NEGATIVE
Resp Syncytial Virus by PCR: NEGATIVE
SARS Coronavirus 2 by RT PCR: NEGATIVE

## 2022-07-09 LAB — CBC
HCT: 39.5 % (ref 36.0–46.0)
Hemoglobin: 12.9 g/dL (ref 12.0–15.0)
MCH: 27.6 pg (ref 26.0–34.0)
MCHC: 32.7 g/dL (ref 30.0–36.0)
MCV: 84.6 fL (ref 80.0–100.0)
Platelets: 246 10*3/uL (ref 150–400)
RBC: 4.67 MIL/uL (ref 3.87–5.11)
RDW: 12.5 % (ref 11.5–15.5)
WBC: 6.4 10*3/uL (ref 4.0–10.5)
nRBC: 0 % (ref 0.0–0.2)

## 2022-07-09 LAB — MAGNESIUM: Magnesium: 2.1 mg/dL (ref 1.7–2.4)

## 2022-07-09 LAB — BRAIN NATRIURETIC PEPTIDE: B Natriuretic Peptide: 89.9 pg/mL (ref 0.0–100.0)

## 2022-07-09 LAB — D-DIMER, QUANTITATIVE: D-Dimer, Quant: <0.27 ug/mL-FEU (ref 0.00–0.50)

## 2022-07-09 NOTE — Discharge Instructions (Addendum)
Your EKG revealed supraventricular bigeminy and your symptoms of palpitations and shortness of breath may be symptoms associated with your PACs.  You had underlying artifact on your EKG however after discussion with on-call cardiology you do not appear to be in atrial fibrillation but rather in bigeminy.  Recommend you continue taking your home metoprolol.  Your laboratory evaluation and chest x-ray was reassuring.  You had a normal D-dimer and negative cardiac troponins.  You had no significant electrolyte abnormality and no anemia.  Your COVID-19, influenza and RSV PCR testing was negative.  Your symptoms improved following rest in the emergency department.  I have low suspicion for any acute cardiac arrhythmia or structural abnormality of your heart or other intrathoracic abnormality which would require further testing or inpatient workup at this time.  Recommend you call your cardiologist in the morning to discuss your presentation tonight.  You could benefit from outpatient cardiac monitoring if you continue to experience symptoms.  Thyroid function studies are a send out and will be available on our patient portal.

## 2022-07-09 NOTE — ED Triage Notes (Addendum)
Pt arrives with c/o SOB that started this evening. Per pt, she has hx of a.fib. Pt denies CP. Pt also endorses palpitations. Pt exertional SOB after walking from waiting room.

## 2022-07-09 NOTE — ED Provider Notes (Addendum)
[place] [facility] AT [facility] Provider Note   CSN: [id number] Arrival date & time: 07/09/22  1918     History  Chief Complaint  Patient presents with   Shortness of Breath   Palpitations    Alexandra Walker is a 67 y.o. female.   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  The patient states that earlier this evening she developed palpitation shortness of breath despite taking her oral metoprolol.  She endorsed brief sensation of chest tightness.  She states that she has a history of Slutsky's ring as well.  She also states that she had a coronary CTA which showed no evidence of CAD with a coronary calcium score of 0.  She currently denies any chest pain.  She denies any active palpitations.  She states that she checked her heart rate while she is experiencing the symptoms and it was around the low 60s.  Her last echocardiogram was in 2017 which revealed a normal LVEF with no diastolic dysfunction or valvular abnormality.   She denies any cough, fever or chills.  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  She is seeing a lot of flu B and gastroenteritis recently.  Home Medications Prior to Admission medications   Medication Sig Start Date End Date Taking? Authorizing Provider  amphetamine-dextroamphetamine (ADDERALL) 10 MG tablet Take 1 tablet (10 mg total) by mouth daily as needed. 05/27/22   Leamon Arnt, MD  aspirin 325 MG tablet Take 325 mg by mouth daily.     [provider]  cholecalciferol (VITAMIN D) 1000 UNITS tablet Take 2,000 Units by mouth daily.     [provider]  cycloSPORINE (RESTASIS) 0.05 % ophthalmic emulsion Place 1 drop into both eyes 2 times daily. 04/29/22     diclofenac sodium (VOLTAREN) 1 % GEL  05/30/16   [provider]  fluticasone (FLONASE) 50 MCG/ACT nasal spray Place 1 spray into both nostrils in the morning and at bedtime. 03/28/22   Leamon Arnt, MD  hydrochlorothiazide (HYDRODIURIL) 25 MG tablet Take 1 tablet (25 mg total) by mouth daily. 09/27/21   Leamon Arnt, MD  losartan (COZAAR) 100 MG tablet TAKE 1 TABLET (100 MG TOTAL) BY MOUTH DAILY. 10/19/21 10/19/22  Leamon Arnt, MD  methocarbamol (ROBAXIN) 500 MG tablet Take 1 tablet (500 mg total) by mouth 4 (four) times daily as needed for 5 days. 05/07/22     metoprolol succinate (TOPROL-XL) 50 MG 24 hr tablet Take 1 tablet (50 mg total) by mouth daily with or immediately following a meal. 02/27/22 02/27/23  Leamon Arnt, MD  Omega-3 1000 MG CAPS Take 1 capsule by mouth daily.     [provider]  rosuvastatin (CRESTOR) 20 MG tablet Take 1 tablet (20 mg total) by mouth daily. 05/31/22 05/31/23  Leamon Arnt, MD  tirzepatide [facility]) 2.5 MG/0.5ML Pen Inject 2.5 mg into the skin once a week. 06/17/22   Laqueta Linden, MD  Turmeric 450 MG CAPS Take 450 mg by mouth daily.     [provider]      Allergies    Ace inhibitors, Oxycodone-acetaminophen, Vicodin [hydrocodone-acetaminophen], Victoza [liraglutide], and Adhesive [tape]    Review of Systems   Review of Systems  Respiratory:  Positive for chest tightness and shortness of breath.   Cardiovascular:  Positive for palpitations.  All other systems reviewed and are negative.   Physical Exam Updated Vital Signs BP (!) 148/84 (BP Location: Right Arm)   Pulse 62   Temp 98.1 F (36.7 C) (Oral)   Resp 15   Wt 113.4 kg   LMP 05/20/2008   SpO2 100%   BMI 40.35  kg/m  Physical Exam Vitals and nursing note reviewed.  Constitutional:      General: She is not in acute distress.    Appearance: She is well-developed. She is obese.  HENT:     Head: Normocephalic and atraumatic.  Eyes:     Conjunctiva/sclera: Conjunctivae normal.  Cardiovascular:     Rate and Rhythm: Normal rate and regular rhythm.     Heart sounds: No murmur heard. Pulmonary:     Effort: Pulmonary effort is normal. No respiratory distress.     Breath sounds: Normal breath sounds.  Abdominal:     Palpations: Abdomen is soft.     Tenderness: There is no abdominal tenderness.  Musculoskeletal:        General: No swelling.     Cervical back: Neck supple.  Skin:    General: Skin is warm and dry.     Capillary Refill: Capillary refill takes less than 2 seconds.  Neurological:     Mental Status: She is alert.  Psychiatric:        Mood and Affect: Mood normal.     ED Results / Procedures / Treatments   Labs (all labs ordered are listed, but only abnormal results are displayed) Labs Reviewed  BASIC METABOLIC PANEL - Abnormal; Notable for the following components:      Result Value   Glucose, Bld 141 (*)    Calcium 8.8 (*)    All other components within normal limits  RESP PANEL BY RT-PCR (RSV, FLU A&B, COVID)  RVPGX2  CBC  D-DIMER, QUANTITATIVE  BRAIN NATRIURETIC PEPTIDE  TSH  T4, FREE  MAGNESIUM  TROPONIN I (HIGH SENSITIVITY)  TROPONIN I (HIGH SENSITIVITY)    EKG EKG Interpretation  Date/Time:  Tuesday July 09 2022 22:54:33 EST Ventricular Rate:  69 PR Interval:  223 QRS Duration: 141 QT Interval:  441 QTC Calculation: 403 R Axis:   -58 Text Interpretation: Sinus rhythm Supraventricular bigeminy Prolonged PR interval Left bundle branch block T wave invserions appear new inferiorly Confirmed by Regan Lemming ([id number]) on 07/09/2022 11:30:14 PM  Radiology DG Chest 2 View  Result Date: 07/09/2022 CLINICAL DATA:  Short of breath, atrial fibrillation EXAM:  CHEST - 2 VIEW COMPARISON:  08/27/2019 FINDINGS: The heart size and mediastinal contours are within normal limits. Both lungs are clear. The visualized skeletal structures are unremarkable. IMPRESSION: No active cardiopulmonary disease. Electronically Signed   By: Randa Ngo M.D.   On: 07/09/2022 19:54    Procedures Procedures    Medications Ordered in ED Medications - No data to display  ED Course/ Medical Decision Making/ A&P                             Medical Decision Making Amount and/or Complexity of Data Reviewed Labs: ordered. Radiology: ordered.    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  The patient states that earlier this evening she developed palpitation shortness of breath despite taking her oral metoprolol.  She endorsed brief sensation of chest tightness.  She states that she has a history of Slutsky's ring as well.  She also states that she had a coronary CTA which showed no evidence of CAD with a coronary calcium score of 0.  She currently denies any chest pain.  She denies any active palpitations.  She states that she checked her heart rate while she is experiencing the symptoms and it was around the low 60s.  Her last echocardiogram was in 2017 which revealed a normal LVEF with no diastolic dysfunction or valvular abnormality.   She denies any cough, fever or chills.  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  She is seeing a lot of flu B and gastroenteritis recently.  On arrival, the patient was vitally stable, afebrile, not tachycardic or tachypneic,  normotensive, saturating 100% on room air.  Sinus rhythm noted on cardiac telemetry.  Differential diagnosis includes SVT, electrolyte abnormality, PE, less likely ACS, less likely other cardiac arrhythmia, anxiety, thyroid dysfunction, viral infection, less likely pneumothorax or pneumonia.  Initial EKG revealed sinus rhythm, ventricular rate 69, a supraventricular bigeminy, left bundle branch block which is not new.  No ST segment changes. T wave inversions in the inferior leads new compared to prior EKGs.  Chest x-ray was performed revealed no acute cardiac or pulmonary abnormality.   Initial laboratory evaluation significant for CBC without a leukocytosis or anemia, BMP without significant electrolyte abnormality, normal renal function, initial troponin 4.  Given the patient's presentation, will plan for repeat troponin, thyroid function studies, magnesium, D-dimer, BNP.  A COVID-19 and influenza PCR swab was collected and pending.  A repeat EKG was ordered.  Plan at time of signout to Dr. Ralene Bathe at 2330 to follow-up the results of remaining testing, disposition pending testing.    Final Clinical Impression(s) / ED Diagnoses Final diagnoses:  Palpitations  SOB (shortness of breath)    Rx / DC Orders ED Discharge Orders     None         Regan Lemming, MD 07/09/22 [id number]    Regan Lemming, MD 07/09/22 [id number]

## 2022-07-10 LAB — T4, FREE: Free T4: 0.88 ng/dL (ref 0.61–1.12)

## 2022-07-10 LAB — TSH: TSH: 1.545 u[IU]/mL (ref 0.350–4.500)

## 2022-07-10 NOTE — Progress Notes (Unsigned)
Cardiology Office Note:    Date:  07/11/2022   ID:  Alexandra Walker, DOB Aug 04, 1955, MRN [id number]  PCP:  Leamon Arnt, MD   [place] Prov[facility] Cardiologist:  Lauree Chandler, MD { Referring MD: Leamon Arnt, MD   Chief Complaint  Patient presents with   Follow-up    palpitations    History of Present Illness:    Alexandra Walker is a 67 y.o. 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  She establish care with Dr. Angelena Form in 2013 for chronic chest pain.  Her PACs have been controlled with Toprol.  She also has esophagitis and had an esophageal dilation in 2014.  She has had prior stress tests with a negative stress echo 1003, 2005, and a normal stress Myoview 2009.  CT coronary in 2017 showed a coronary calcium score of 0 and no CAD.  Echocardiogram around the same time showed preserved EF no regional wall motion abnormality.  She did have a bout of A-fib in 2017 in the setting of urosepsis.  Subsequent heart monitor 2017 showed sinus rhythm, sinus bradycardia with lowest heart rate of 50, no ventricular tachycardia or atrial arrhythmias and no high-grade AV block.    She was last seen in clinic with Dr. Angelena Form 09/21/2021 and was doing well at that time.  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  To [facility] ED 07/09/2022 with palpitations and chest tightness.  She ruled out with negative troponins, nonischemic EKG, TSH and electrolytes within normal limits.  She was discharged without admission.  She presents today for follow-up.  She recounts waking up at 6pm and noticing palpitations, shortness of breath and chest tightness. This lasted until she got home from the ER, about midnight. She has not had a recurrence, but  does report she feels like she is holding onto more fluid since eating extra salt several days prior. She does admit to dietary indiscretion in terms of sodium intake. She did double her HCTZ to 50 mg without increased urination, she denies ongoing CP, orthopnea, and PND. She uses her CPAP religiously, even with naps. We discussed she does not have an indication for a PPM and that her LBBB and first degree heart block are old.   Past Medical History:  Diagnosis Date   ADD (attention deficit disorder)    Allergy    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HCC)    Bilateral swelling of feet    Cholelithiasis    Chronic depressive disorder    Circadian rhythm sleep disorder, shift work    DDD (degenerative disc disease), 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   Diabetes ([place])    Diverticulosis    Minimal   Fatty liver    GERD (gastroesophageal reflux disease)    Hepatic steatosis    History of hiatal hernia 2017   Small   HLA B27 (HLA B27 positive)    Hx of pancreatitis    Hyperlipidemia    Hypertension    IBS (irritable bowel syndrome)    Ischemic colitis ([place])    Lactose intolerance    Lumbar scoliosis 2010   Mild   Migraine headache without aura 05/10/2015   Nephrolithiasis 2014   Left, minimal   Obesity due to excess calories with serious comorbidity 05/24/2019  Palpitations    Prediabetes    Presbyopia    Right bundle branch block    Sepsis ([place]) 01/2016   Sleep hypopnea    Vitamin D deficiency     Past Surgical History:  Procedure Laterality Date   BLADDER SURGERY     BREAST BIOPSY Left    CERVICAL CONE BIOPSY     CHOLECYSTECTOMY N/A 10/26/2014   Procedure: LAPAROSCOPIC CHOLECYSTECTOMY;  Surgeon: Coralie Keens, MD;  Location: [facility];  Service: General;  Laterality: N/A;   COLONOSCOPY W/ BIOPSIES  multiple   CYSTOSCOPY WITH RETROGRADE PYELOGRAM, URETEROSCOPY AND STENT PLACEMENT Left 06/26/2018   Procedure: CYSTOSCOPY WITH RETROGRADE PYELOGRAM, URETEROSCOPY AND STENT  PLACEMENT;  Surgeon: Alexis Frock, MD;  Location: [facility];  Service: Urology;  Laterality: Left;   DIAGNOSTIC LAPAROSCOPY     DILATION AND CURETTAGE OF UTERUS  11/2009   DUPUYTREN CONTRACTURE RELEASE Left    x 3 , Dr Amedeo Plenty   ESOPHAGOGASTRODUODENOSCOPY  multiple   EYE SURGERY     HOLMIUM LASER APPLICATION Left [id number]   Procedure: HOLMIUM LASER APPLICATION;  Surgeon: Alexis Frock, MD;  Location: [facility];  Service: Urology;  Laterality: Left;   KNEE ARTHROSCOPY Left 11/2008   LAPAROSCOPIC VAGINAL HYSTERECTOMY WITH SALPINGO OOPHORECTOMY Bilateral 10/03/2015   Procedure: LAPAROSCOPIC ASSISTED VAGINAL HYSTERECTOMY WITH SALPINGECTOMY;  Surgeon: Eldred Manges, MD;  Location: [facility];  Service: Gynecology;  Laterality: Bilateral;   LASIK  1990   RK   LEFT FINGER SURGERY     LIVER BIOPSY     MASS EXCISION Left 12/24/2012   Procedure: LEFT EXCISION OF PALMAR MASS X TWO;  Surgeon: Roseanne Kaufman, MD;  Location: [facility];  Service: Orthopedics;  Laterality: Left;   PHOTOREFRACTIVE KERATOTOMY  1994   RADIAL KERATOTOMY  1994    Current Medications: Current Meds  Medication Sig   amphetamine-dextroamphetamine (ADDERALL) 10 MG tablet Take 1 tablet (10 mg total) by mouth daily as needed.   aspirin 325 MG tablet Take 325 mg by mouth daily.   cholecalciferol (VITAMIN D) 1000 UNITS tablet Take 2,000 Units by mouth daily.    cycloSPORINE (RESTASIS) 0.05 % ophthalmic emulsion Place 1 drop into both eyes 2 times daily.   diclofenac sodium (VOLTAREN) 1 % GEL    fluticasone (FLONASE) 50 MCG/ACT nasal spray Place 1 spray into both nostrils in the morning and at bedtime.   furosemide (LASIX) 40 MG tablet Take 1 tablet (40 mg total) by mouth daily.   hydrochlorothiazide (HYDRODIURIL) 25 MG tablet Take 1 tablet (25 mg total) by mouth daily.   losartan (COZAAR) 100 MG tablet TAKE 1 TABLET (100 MG TOTAL) BY MOUTH DAILY.   methocarbamol (ROBAXIN)  500 MG tablet Take 1 tablet (500 mg total) by mouth 4 (four) times daily as needed for 5 days.   metoprolol succinate (TOPROL-XL) 50 MG 24 hr tablet Take 1 tablet (50 mg total) by mouth daily with or immediately following a meal. (Patient taking differently: Take 75 mg by mouth daily with lunch. Take 25 mg  1/2 Tablet in The Morning. Take 50 mg in The Evening.)   Omega-3 1000 MG CAPS Take 1 capsule by mouth daily.    rosuvastatin (CRESTOR) 20 MG tablet Take 1 tablet (20 mg total) by mouth daily.   tirzepatide [facility]) 2.5 MG/0.5ML Pen Inject 2.5 mg into the skin once a week.   Turmeric 450 MG CAPS Take 450 mg by mouth daily.  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   Social Determinants of Health   Financial Resource Strain: Not on file  Food Insecurity: Not on file  Transportation Needs: Not on file  Physical Activity: Not on file  Stress: Not on file  Social Connections: Not on file     Family History: The patient's family history includes Coronary artery disease in her mother; Depression in her mother; Diabetes in her mother; Heart attack in her father and mother; Heart disease in her  father and mother; Hyperlipidemia in her mother; Hypertension in her brother, mother, and sister; Kidney disease in her mother; Obesity in her mother; Sleep apnea in her mother; Sudden death in her father. There is no history of Colon cancer.  ROS:   Please see the history of present illness.     All other systems reviewed and are negative.  EKGs/Labs/Other Studies Reviewed:    The following studies were reviewed today:  Echo 2017 Study Conclusions   - Left ventricle: The cavity size was normal. Systolic function was    normal. The estimated ejection fraction was in the range of 60%    to 65%. Wall motion was normal; there were no regional wall    motion abnormalities. Left ventricular diastolic function    parameters were normal.  - Left atrium: The atrium was mildly dilated   EKG:  EKG is  ordered today.  The ekg ordered today demonstrates sinus bradycardia with HR 57, first degree heart block. LBBB, septal Q waves - stable from prior tracings  Recent Labs: 05/27/2022: ALT 20 07/09/2022: B Natriuretic Peptide 89.9; BUN 16; Creatinine, Ser 0.75; Hemoglobin 12.9; Magnesium 2.1; Platelets 246; Potassium 3.5; Sodium 136; TSH 1.545  Recent Lipid Panel    Component Value Date/Time   CHOL 144 05/27/2022 1045   CHOL 134 09/03/2021 1219   TRIG 152.0 (H) 05/27/2022 1045   HDL 38.60 (L) 05/27/2022 1045   HDL 39 (L) 09/03/2021 1219   CHOLHDL 4 05/27/2022 1045   VLDL 30.4 05/27/2022 1045   LDLCALC 75 05/27/2022 1045   LDLCALC 70 09/03/2021 1219   LDLDIRECT 118.0 05/24/2019 1615     Risk Assessment/Calculations:                Physical Exam:    VS:  BP 112/68   Pulse (!) 57   Wt 260 lb 3.2 oz (118 kg)   LMP 05/20/2008   SpO2 99%   BMI 42.00 kg/m     Wt Readings from Last 3 Encounters:  07/11/22 260 lb 3.2 oz (118 kg)  07/09/22 250 lb (113.4 kg)  06/17/22 249 lb (  112.9 kg)     GEN:  Well nourished, well developed in no acute distress HEENT: Normal NECK: No JVD; No  carotid bruits LYMPHATICS: No lymphadenopathy CARDIAC: RRR, no murmurs, rubs, gallops RESPIRATORY:  Clear to auscultation without rales, wheezing or rhonchi  ABDOMEN: Soft, non-tender, non-distended MUSCULOSKELETAL:  No edema; No deformity  SKIN: Warm and dry NEUROLOGIC:  Alert and oriented x 3 PSYCHIATRIC:  Normal affect   ASSESSMENT:    1. Palpitations   2. Paroxysmal SVT (supraventricular tachycardia)   3. PAC (premature atrial contraction)   4. Atrial bigeminy   5. Primary hypertension   6. Chest pain of uncertain etiology   7. Bilateral lower extremity edema    PLAN:    In order of problems listed above:  Palpations Hx of SVT, PACs EKG with atrial bigeminy in the ER Has been maintained on 75 mg toprol - she takes 25 mg qAM and 50 mg qPM No room to increase to 50 mg BID as she is bradycardic today Only one occurrence, suspect this was related to atrial bigeminy  Will hold off on placing a heart monitor for now   Chest pain Coronary CTA with calcium score zero and no CAD Echo largely unremarkable in 2017 Sounds like chest tightness was related to PACs, resolved when atrial bigeminy resolves No further workup for now   Hypertension Remains on 100 mg losartan, 75 mg toprol, and 25 mg HCTZ BP well controlled   Lower extremity swelling Stable for her, but slightly increased from her baseline She thinks she is holding onto fluid in her abdomen and fingers. She may have had extra fluid - may be related to her bigeminy  Will prescribe a short course of 40 mg Lasix daily x 4 days.  She will then stop Lasix and evaluate her volume status.  If she reaccumulate's fluid would have a low threshold to repeat an echocardiogram.  She will let me know.   Follow up with Dr. Angelena Form this summer as planned. She can cancel this if she is feeling well and treat today's visit as her annual.     Medication Adjustments/Labs and Tests Ordered: Current medicines are reviewed at  length with the patient today.  Concerns regarding medicines are outlined above.  Orders Placed This Encounter  Procedures   EKG 12-Lead   Meds ordered this encounter  Medications   furosemide (LASIX) 40 MG tablet    Sig: Take 1 tablet (40 mg total) by mouth daily.    Dispense:  30 tablet    Refill:  0    Patient Instructions  Medication Instructions:  Lasix 40 mg ( Take 1 Tablet Daily for (4) Days.) *If you need a refill on your cardiac medications before your next appointment, please call your pharmacy*   Lab Work: No labs If you have labs (blood work) drawn today and your tests are completely normal, you will receive your results only by: [place] (if you have MyChart) OR A paper copy in the mail If you have any lab test that is abnormal or we need to change your treatment, we will call you to review the results.   Testing/Procedures: No Testing   Follow-Up: At [facility], you and your health needs are our priority.  As part of our continuing mission to provide you with exceptional heart care, we have created designated Provider Care Teams.  These Care Teams include your primary Cardiologist (physician) and Advanced Practice Providers (APPs -  Physician Assistants and  Nurse Practitioners) who all work together to provide you with the care you need, when you need it.  We recommend signing up for the patient portal called "MyChart".  Sign up information is provided on this After Visit Summary.  MyChart is used to connect with patients for Virtual Visits (Telemedicine).  Patients are able to view lab/test results, encounter notes, upcoming appointments, etc.  Non-urgent messages can be sent to your provider as well.   To learn more about what you can do with MyChart, go to [email or web address].    Your next appointment:   4 month(s)  Provider:   Lauree Chandler, MD    Other Instructions Call or message office  after taking Lasix if weight gain  increases.   Signed, Ledora Bottcher, [place]  07/11/2022 10:28 AM    [place]

## 2022-07-11 ENCOUNTER — Ambulatory Visit: Payer: 59 | Attending: Physician Assistant | Admitting: Physician Assistant

## 2022-07-11 ENCOUNTER — Other Ambulatory Visit (HOSPITAL_BASED_OUTPATIENT_CLINIC_OR_DEPARTMENT_OTHER): Payer: Self-pay

## 2022-07-11 ENCOUNTER — Encounter: Payer: Self-pay | Admitting: Physician Assistant

## 2022-07-11 VITALS — BP 112/68 | HR 57 | Wt 260.2 lb

## 2022-07-11 DIAGNOSIS — I471 Supraventricular tachycardia, unspecified: Secondary | ICD-10-CM | POA: Diagnosis not present

## 2022-07-11 DIAGNOSIS — R002 Palpitations: Secondary | ICD-10-CM

## 2022-07-11 DIAGNOSIS — R600 Localized edema: Secondary | ICD-10-CM

## 2022-07-11 DIAGNOSIS — I498 Other specified cardiac arrhythmias: Secondary | ICD-10-CM | POA: Diagnosis not present

## 2022-07-11 DIAGNOSIS — I491 Atrial premature depolarization: Secondary | ICD-10-CM

## 2022-07-11 DIAGNOSIS — R079 Chest pain, unspecified: Secondary | ICD-10-CM | POA: Diagnosis not present

## 2022-07-11 DIAGNOSIS — I10 Essential (primary) hypertension: Secondary | ICD-10-CM | POA: Diagnosis not present

## 2022-07-11 MED ORDER — FUROSEMIDE 40 MG PO TABS
40.0000 mg | ORAL_TABLET | Freq: Every day | ORAL | 0 refills | Status: DC
  Filled 2022-07-11: qty 30, 30d supply, fill #0

## 2022-07-11 NOTE — Patient Instructions (Signed)
Medication Instructions:  Lasix 40 mg ( Take 1 Tablet Daily for (4) Days.) *If you need a refill on your cardiac medications before your next appointment, please call your pharmacy*   Lab Work: No labs If you have labs (blood work) drawn today and your tests are completely normal, you will receive your results only by: [place] (if you have MyChart) OR A paper copy in the mail If you have any lab test that is abnormal or we need to change your treatment, we will call you to review the results.   Testing/Procedures: No Testing   Follow-Up: At [facility], you and your health needs are our priority.  As part of our continuing mission to provide you with exceptional heart care, we have created designated Provider Care Teams.  These Care Teams include your primary Cardiologist (physician) and Advanced Practice Providers (APPs -  Physician Assistants and Nurse Practitioners) who all work together to provide you with the care you need, when you need it.  We recommend signing up for the patient portal called "MyChart".  Sign up information is provided on this After Visit Summary.  MyChart is used to connect with patients for Virtual Visits (Telemedicine).  Patients are able to view lab/test results, encounter notes, upcoming appointments, etc.  Non-urgent messages can be sent to your provider as well.   To learn more about what you can do with MyChart, go to [email or web address].    Your next appointment:   4 month(s)  Provider:   Lauree Chandler, MD    Other Instructions Call or message office  after taking Lasix if weight gain increases.

## 2022-07-12 ENCOUNTER — Encounter: Payer: Self-pay | Admitting: Cardiovascular Disease

## 2022-07-12 ENCOUNTER — Other Ambulatory Visit (HOSPITAL_BASED_OUTPATIENT_CLINIC_OR_DEPARTMENT_OTHER): Payer: Self-pay

## 2022-07-12 DIAGNOSIS — I471 Supraventricular tachycardia, unspecified: Secondary | ICD-10-CM

## 2022-07-12 DIAGNOSIS — I498 Other specified cardiac arrhythmias: Secondary | ICD-10-CM

## 2022-07-12 DIAGNOSIS — Z1231 Encounter for screening mammogram for malignant neoplasm of breast: Secondary | ICD-10-CM

## 2022-07-12 MED ORDER — IMIQUIMOD 5 % EX CREA
TOPICAL_CREAM | CUTANEOUS | 0 refills | Status: DC
  Filled 2022-07-12: qty 24, 24d supply, fill #0

## 2022-07-15 ENCOUNTER — Other Ambulatory Visit (INDEPENDENT_AMBULATORY_CARE_PROVIDER_SITE_OTHER): Payer: Self-pay | Admitting: Family Medicine

## 2022-07-15 ENCOUNTER — Other Ambulatory Visit (HOSPITAL_BASED_OUTPATIENT_CLINIC_OR_DEPARTMENT_OTHER): Payer: Self-pay

## 2022-07-15 ENCOUNTER — Telehealth: Payer: Self-pay | Admitting: Cardiovascular Disease

## 2022-07-15 DIAGNOSIS — E1165 Type 2 diabetes mellitus with hyperglycemia: Secondary | ICD-10-CM

## 2022-07-15 MED ORDER — FUROSEMIDE 40 MG PO TABS
40.0000 mg | ORAL_TABLET | Freq: Every day | ORAL | 1 refills | Status: DC
  Filled 2022-07-15 – 2022-08-04 (×2): qty 90, 90d supply, fill #0
  Filled 2022-09-16 – 2022-11-04 (×2): qty 90, 90d supply, fill #1

## 2022-07-15 MED ORDER — POTASSIUM CHLORIDE CRYS ER 20 MEQ PO TBCR
20.0000 meq | EXTENDED_RELEASE_TABLET | Freq: Every day | ORAL | 1 refills | Status: AC
  Filled 2022-07-15: qty 90, 90d supply, fill #0

## 2022-07-15 NOTE — Telephone Encounter (Signed)
Called patient.  See patient message from 07/12/22.

## 2022-07-15 NOTE — Telephone Encounter (Signed)
Pt c/o swelling: STAT is pt has developed SOB within 24 hours  How much weight have you gained and in what time span?  Gained 2 lbs since stopping medication today  If swelling, where is the swelling located?  Ankles and hands   Are you currently taking a fluid pill?  Furosemide 40 MG once daily, for 4 days (completed yesterday). Patient states Angie, PA, told her that if she gained weight after being on diuretic for 4 days then she may need to order an ultrasound  Are you currently SOB? No   Do you have a log of your daily weights (if so, list)?  256 to 250 yesterday and back up to 252 today   Have you gained 3 pounds in a day or 5 pounds in a week?    Have you traveled recently?  No

## 2022-07-16 ENCOUNTER — Encounter (INDEPENDENT_AMBULATORY_CARE_PROVIDER_SITE_OTHER): Payer: Self-pay | Admitting: Family Medicine

## 2022-07-16 ENCOUNTER — Other Ambulatory Visit: Payer: Self-pay | Admitting: Physician Assistant

## 2022-07-16 ENCOUNTER — Other Ambulatory Visit (HOSPITAL_BASED_OUTPATIENT_CLINIC_OR_DEPARTMENT_OTHER): Payer: Self-pay

## 2022-07-16 DIAGNOSIS — E1165 Type 2 diabetes mellitus with hyperglycemia: Secondary | ICD-10-CM

## 2022-07-16 DIAGNOSIS — R600 Localized edema: Secondary | ICD-10-CM

## 2022-07-16 MED ORDER — TIRZEPATIDE 2.5 MG/0.5ML ~~LOC~~ SOAJ
2.5000 mg | SUBCUTANEOUS | 0 refills | Status: DC
  Filled 2022-07-16: qty 2, 28d supply, fill #0

## 2022-07-16 NOTE — Progress Notes (Signed)
Echocardiogram for pedal edema and SOB.   40 mg lasix daily with 20 mEq potassium

## 2022-07-19 DIAGNOSIS — I498 Other specified cardiac arrhythmias: Secondary | ICD-10-CM

## 2022-07-19 HISTORY — DX: Other specified cardiac arrhythmias: I49.8

## 2022-08-04 ENCOUNTER — Other Ambulatory Visit: Payer: Self-pay | Admitting: Family Medicine

## 2022-08-05 ENCOUNTER — Other Ambulatory Visit: Payer: Self-pay

## 2022-08-05 ENCOUNTER — Other Ambulatory Visit (HOSPITAL_BASED_OUTPATIENT_CLINIC_OR_DEPARTMENT_OTHER): Payer: Self-pay

## 2022-08-05 ENCOUNTER — Encounter (INDEPENDENT_AMBULATORY_CARE_PROVIDER_SITE_OTHER): Payer: Self-pay | Admitting: Family Medicine

## 2022-08-05 ENCOUNTER — Ambulatory Visit (INDEPENDENT_AMBULATORY_CARE_PROVIDER_SITE_OTHER): Payer: 59 | Admitting: Family Medicine

## 2022-08-05 VITALS — BP 122/62 | HR 58 | Temp 98.4°F | Ht 66.0 in | Wt 258.0 lb

## 2022-08-05 DIAGNOSIS — E1159 Type 2 diabetes mellitus with other circulatory complications: Secondary | ICD-10-CM | POA: Diagnosis not present

## 2022-08-05 DIAGNOSIS — I152 Hypertension secondary to endocrine disorders: Secondary | ICD-10-CM | POA: Diagnosis not present

## 2022-08-05 DIAGNOSIS — E669 Obesity, unspecified: Secondary | ICD-10-CM

## 2022-08-05 DIAGNOSIS — Z7985 Long-term (current) use of injectable non-insulin antidiabetic drugs: Secondary | ICD-10-CM | POA: Diagnosis not present

## 2022-08-05 DIAGNOSIS — E1165 Type 2 diabetes mellitus with hyperglycemia: Secondary | ICD-10-CM

## 2022-08-05 DIAGNOSIS — Z6841 Body Mass Index (BMI) 40.0 and over, adult: Secondary | ICD-10-CM

## 2022-08-05 MED ORDER — AMPHETAMINE-DEXTROAMPHETAMINE 10 MG PO TABS
10.0000 mg | ORAL_TABLET | Freq: Every day | ORAL | 0 refills | Status: DC | PRN
  Filled 2022-08-05: qty 30, 30d supply, fill #0

## 2022-08-05 NOTE — Progress Notes (Signed)
Chief Complaint:   OBESITY Alexandra Walker is here to discuss her progress with her obesity treatment plan along with follow-up of her obesity related diagnoses. Alexandra Walker is on keeping a food journal and adhering to recommended goals of 1540-1600 calories and 95+ protein and states she is following her eating plan approximately 60% of the time. Alexandra Walker states she is walking the treadmill for 20 minutes 1 time per week.  Today's visit was #: 86 Starting weight: 265 LBS Starting date: 05/24/2021 Today's weight: 258 LBS Today's date: 08/05/2022 Total lbs lost to date: 7 LBS Total lbs lost since last in-office visit: +10 LBS  Interim History: Patient has been increasing weight since last appointment.  She was down with her activity due to QL.  She was seen in the ED on 07/09/22 and then followed up on 07/11/22 with cardiology.  Was prescribed a short course of Lasix to help with swelling.  Some days she didn't urinate much while on lasix.  Is on 2.5mg  of Mounjaro and she isn't noticing much in terms of control of carb intake.  Some days she isn't getting enough calories but is getting 80g of protein a day.   Subjective:   1. Type 2 diabetes mellitus with hyperglycemia, without long-term current use of insulin ([place]) Patient last A1c was 5.7.  Patient is on Mounjaro 2.5 mg subcu weekly.  Patient denies significant hunger on this dose.  2. Hypertension associated with diabetes ([place]) Patient is on Cozaar, HCTZ, Lasix, Toprol.  Blood pressure is well-controlled today.  Patient sees cardiology for palpitations.  Assessment/Plan:   1. Type 2 diabetes mellitus with hyperglycemia, without long-term current use of insulin (HCC) Patient to increase frequency of Mounjaro to 2.5 every 5 days if she does well she will increase to 5 mg subcu weekly.  2. Hypertension associated with diabetes ([place]) Continue current medications no change in dose.  3. BMI 40.0-44.9, adult ([place])  4. Obesity with starting BMI of  42.7 Alexandra Walker is currently in the action stage of change. As such, her goal is to continue with weight loss efforts. She has agreed to keeping a food journal and adhering to recommended goals of 1500-1600 calories and 95+ protein.   Exercise goals: All adults should avoid inactivity. Some physical activity is better than none, and adults who participate in any amount of physical activity gain some health benefits.  Behavioral modification strategies: increasing lean protein intake, increasing vegetables, meal planning and cooking strategies, better snacking choices, and planning for success.  English has agreed to follow-up with our clinic in 6 weeks. She was informed of the importance of frequent follow-up visits to maximize her success with intensive lifestyle modifications for her multiple health conditions.   Objective:   Blood pressure 122/62, pulse (!) 58, temperature 98.4 F (36.9 C), height 5\' 6"  (1.676 m), weight 258 lb (117 kg), last menstrual period 05/20/2008, SpO2 99 %. Body mass index is 41.64 kg/m.  General: Cooperative, alert, well developed, in no acute distress. HEENT: Conjunctivae and lids unremarkable. Cardiovascular: Regular rhythm.  Lungs: Normal work of breathing. Neurologic: No focal deficits.   Lab Results  Component Value Date   CREATININE 0.75 07/09/2022   BUN 16 07/09/2022   NA 136 07/09/2022   K 3.5 07/09/2022   CL 104 07/09/2022   CO2 25 07/09/2022   Lab Results  Component Value Date   ALT 20 05/27/2022   AST 17 05/27/2022   ALKPHOS 71 05/27/2022   BILITOT 0.5 05/27/2022  Lab Results  Component Value Date   HGBA1C 5.7 (A) 05/27/2022   HGBA1C 6.2 (H) 09/03/2021   HGBA1C 6.3 (A) 05/07/2021   HGBA1C 6.8 (H) 11/13/2020   HGBA1C 6.0 (H) 05/05/2020   Lab Results  Component Value Date   INSULIN 14.6 09/03/2021   INSULIN 12.5 05/24/2021   INSULIN 12.5 03/13/2017   INSULIN 10.0 10/28/2016   Lab Results  Component Value Date   TSH 1.545  07/09/2022   Lab Results  Component Value Date   CHOL 144 05/27/2022   HDL 38.60 (L) 05/27/2022   LDLCALC 75 05/27/2022   LDLDIRECT 118.0 05/24/2019   TRIG 152.0 (H) 05/27/2022   CHOLHDL 4 05/27/2022   Lab Results  Component Value Date   VD25OH 46.6 09/03/2021   VD25OH 50.7 05/24/2021   VD25OH 58.00 11/13/2020   Lab Results  Component Value Date   WBC 6.4 07/09/2022   HGB 12.9 07/09/2022   HCT 39.5 07/09/2022   MCV 84.6 07/09/2022   PLT 246 07/09/2022   No results found for: "IRON", "TIBC", "FERRITIN"  Attestation Statements:   Reviewed by clinician on day of visit: allergies, medications, problem list, medical history, surgical history, family history, social history, and previous encounter notes.  I, Davy Pique, RMA, am acting as transcriptionist for Coralie Common, MD.  I have reviewed the above documentation for accuracy and completeness, and I agree with the above. - Coralie Common, MD

## 2022-08-09 ENCOUNTER — Encounter: Payer: Self-pay | Admitting: Family Medicine

## 2022-08-09 ENCOUNTER — Ambulatory Visit: Payer: 59 | Admitting: Family Medicine

## 2022-08-09 VITALS — BP 160/78 | HR 57 | Temp 97.9°F | Ht 66.0 in | Wt 266.4 lb

## 2022-08-09 DIAGNOSIS — R0602 Shortness of breath: Secondary | ICD-10-CM | POA: Diagnosis not present

## 2022-08-09 DIAGNOSIS — K760 Fatty (change of) liver, not elsewhere classified: Secondary | ICD-10-CM | POA: Diagnosis not present

## 2022-08-09 DIAGNOSIS — I10 Essential (primary) hypertension: Secondary | ICD-10-CM

## 2022-08-09 DIAGNOSIS — R600 Localized edema: Secondary | ICD-10-CM | POA: Diagnosis not present

## 2022-08-09 DIAGNOSIS — R1011 Right upper quadrant pain: Secondary | ICD-10-CM

## 2022-08-09 DIAGNOSIS — R1031 Right lower quadrant pain: Secondary | ICD-10-CM

## 2022-08-09 DIAGNOSIS — R002 Palpitations: Secondary | ICD-10-CM | POA: Diagnosis not present

## 2022-08-09 DIAGNOSIS — I491 Atrial premature depolarization: Secondary | ICD-10-CM

## 2022-08-09 LAB — COMPREHENSIVE METABOLIC PANEL
ALT: 17 U/L (ref 0–35)
AST: 13 U/L (ref 0–37)
Albumin: 4 g/dL (ref 3.5–5.2)
Alkaline Phosphatase: 75 U/L (ref 39–117)
BUN: 24 mg/dL — ABNORMAL HIGH (ref 6–23)
CO2: 28 mEq/L (ref 19–32)
Calcium: 9.4 mg/dL (ref 8.4–10.5)
Chloride: 105 mEq/L (ref 96–112)
Creatinine, Ser: 0.86 mg/dL (ref 0.40–1.20)
GFR: 71.39 mL/min (ref >60.00)
Glucose, Bld: 131 mg/dL — ABNORMAL HIGH (ref 70–99)
Potassium: 4.2 mEq/L (ref 3.5–5.1)
Sodium: 141 mEq/L (ref 135–145)
Total Bilirubin: 0.4 mg/dL (ref 0.2–1.2)
Total Protein: 6.2 g/dL (ref 6.0–8.3)

## 2022-08-09 LAB — CBC WITH DIFFERENTIAL/PLATELET
Basophils Absolute: 0 10*3/uL (ref 0.0–0.1)
Basophils Relative: 0.4 % (ref 0.0–3.0)
Eosinophils Absolute: 0.2 10*3/uL (ref 0.0–0.7)
Eosinophils Relative: 3.9 % (ref 0.0–5.0)
HCT: 38.4 % (ref 36.0–46.0)
Hemoglobin: 12.6 g/dL (ref 12.0–15.0)
Lymphocytes Relative: 22.6 % (ref 12.0–46.0)
Lymphs Abs: 1.4 10*3/uL (ref 0.7–4.0)
MCHC: 32.8 g/dL (ref 30.0–36.0)
MCV: 84.7 fl (ref 78.0–100.0)
Monocytes Absolute: 0.5 10*3/uL (ref 0.1–1.0)
Monocytes Relative: 8.8 % (ref 3.0–12.0)
Neutro Abs: 4 10*3/uL (ref 1.4–7.7)
Neutrophils Relative %: 64.3 % (ref 43.0–77.0)
Platelets: 213 10*3/uL (ref 150.0–400.0)
RBC: 4.54 Mil/uL (ref 3.87–5.11)
RDW: 13.2 % (ref 11.5–15.5)
WBC: 6.2 10*3/uL (ref 4.0–10.5)

## 2022-08-09 LAB — POCT URINALYSIS DIPSTICK
Bilirubin, UA: NEGATIVE
Blood, UA: NEGATIVE
Glucose, UA: NEGATIVE
Ketones, UA: NEGATIVE
Nitrite, UA: NEGATIVE
Protein, UA: NEGATIVE
Spec Grav, UA: 1.025 (ref 1.010–1.025)
Urobilinogen, UA: NEGATIVE E.U./dL — AB
pH, UA: 6 (ref 5.0–8.0)

## 2022-08-09 LAB — BRAIN NATRIURETIC PEPTIDE: Pro B Natriuretic peptide (BNP): 64 pg/mL (ref 0.0–100.0)

## 2022-08-09 NOTE — Progress Notes (Unsigned)
Subjective  CC:  Chief Complaint  Patient presents with   concerns    Heart arrhythmia and whether or not to continue Lasix prescribed by cardiology   Abdominal Pain    HPI: Alexandra Walker is a 67 y.o. female who presents to the office today to address the problems listed above in the chief complaint. 67 year old female here for follow-up after ER visit cardiology visit.  I reviewed ER visit, lab work, cardiology consultation.  In brief, went to ER with shortness of breath and chest tightness.  Found to be in atrial bigeminy that resolved spontaneously.  Workup was otherwise unremarkable with normal BNP, negative D-dimer, normal chest x-ray..  Follow-up with cardiology: Chronic complaint of lower extremity edema and weight gain.  Short-term Lasix was mildly helpful but not completely helpful.  Now on Lasix 40 mg intermittently.  Wants to know if she needs to keep taking it.  Complains of bilateral lower extremity edema but also with swelling in the fingers and feels like she has abdominal bloating.  Weight is up about 10 pounds.  Also complains of right upper quadrant pain and right lower quadrant pain intermittent over the last 2 weeks.  Throbbing pain.  No nausea, vomiting, urinary symptoms.  No gross hematuria.  No fevers or chills.  She does have her gallbladder and appendix.  She has bilateral ovaries but is status post partial hysterectomy.  She is postmenopausal.  She is slightly concerned because her friends at work have been diagnosed with terminal cancers like ovarian cancer.  She has no malaise, appetite is good and she is working with healthy weight and wellness now on [place].  Assessment  1. Palpitations   2. SOB (shortness of breath)   3. Bilateral lower extremity edema   4. Right lower quadrant pain   5. Right upper quadrant pain   6. Hepatic steatosis   7. Essential hypertension   8. PAC (premature atrial contraction)      Plan  Palpitations and shortness of breath:  Possibly related to intermittent arrhythmia.  Cardiology has ordered an echocardiogram.  May want to order another monitor.  She is on calcium score of 0 when last tested in 2017.  But strong family history.  Not having chest pain.  Lower extremity edema could be from diastolic heart failure.  Recommend continuing Lasix 40 mg with potassium.  Recheck levels today.  She has not been taking it for a few days Abdominal pain and pelvic pain: Will check ultrasounds and CA125.  Symptoms could be related to [facility]. Hypertension: Now hypotensive off HCTZ.  She will check home readings and send me numbers.  May need blood pressure medication adjustment. Monitor liver function tests and right upper quadrant ultrasound given right upper quadrant pain.  Has history of fatty liver. PACs continue metoprolol twice daily  I spent a total of 42 minutes for this patient encounter. Time spent included preparation, face-to-face counseling with the patient and coordination of care, review of chart and records, and documentation of the encounter.  Follow up: As scheduled for recheck 09/02/2022  Orders Placed This Encounter  Procedures   US PELVIC COMPLETE WITH TRANSVAGINAL   US Abdomen Complete   CBC with Differential/Platelet   Comprehensive metabolic panel   CA 0000000   B Nat Peptide   POCT urinalysis dipstick   No orders of the defined types were placed in this encounter.     I reviewed the patients updated PMH, FH, and SocHx.  Patient Active Problem List   Diagnosis Date Noted   Controlled type 2 diabetes mellitus without complication, without long-term current use of insulin ([place]) 05/27/2022    Priority: High   Morbid obesity ([place]) 08/07/2020    Priority: High   Attention deficit hyperactivity disorder (ADHD), combined type 10/19/2017    Priority: High   OSA on CPAP, followed by Neurology/Sleep 09/26/2016    Priority: High   Essential hypertension 01/31/2016    Priority: High   Mixed  hyperlipidemia 05/10/2015    Priority: High   Paroxysmal SVT (supraventricular tachycardia) 09/19/2011    Priority: High   Bilateral hip bursitis 05/06/2019    Priority: Medium    Nephrolithiasis 03/29/2018    Priority: Medium    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[place])     Priority: Medium    Recurrent circadian rhythm sleep disorder, shift work type 03/31/2017    Priority: Medium    IBS (irritable bowel syndrome), diarrhea predominant 03/17/2017    Priority: Medium    Hepatic steatosis 09/26/2016    Priority: Medium    Diverticulosis of colon 05/16/2018    Priority: Low   Drug-induced pancreatitis, 2015, thought to be due to [facility] 03/29/2018    Priority: Low   History of vitreous detachment OU, HTN retinopathy OU, High myopia OU, Cataracts OU, Vitreous floaters OU, Keratoconjunctivitis sicca OU not specified as Sjogren's 03/29/2018    Priority: Low   History of sepsis 02/01/2016    Priority: Low   Vitamin D deficiency, on Vit D 2000 IU daily 05/10/2015    Priority: Low   Sacroiliitis ([place]) 05/27/2022   Current Meds  Medication Sig   amphetamine-dextroamphetamine (ADDERALL) 10 MG tablet Take 1 tablet (10 mg total) by mouth daily as needed.   aspirin 325 MG tablet Take 325 mg by mouth daily.   cholecalciferol (VITAMIN D) 1000 UNITS tablet Take 2,000 Units by mouth daily.    cycloSPORINE (RESTASIS) 0.05 % ophthalmic emulsion Place 1 drop into both eyes 2 times daily.   diclofenac sodium (VOLTAREN) 1 % GEL    fluticasone (FLONASE) 50 MCG/ACT nasal spray Place 1 spray into both nostrils in the morning and at bedtime.   furosemide (LASIX) 40 MG tablet Take 1 tablet (40 mg total) by mouth daily.   imiquimod (ALDARA) 5 % cream Apply to affected area once daily Monday through Friday X6 weeks   losartan (COZAAR) 100 MG tablet TAKE 1 TABLET (100 MG TOTAL) BY MOUTH DAILY.   methocarbamol (ROBAXIN) 500 MG tablet Take 1 tablet (500 mg total) by mouth 4 (four) times daily as needed for 5 days.    Omega-3 1000 MG CAPS Take 1 capsule by mouth daily.    potassium chloride SA (KLOR-CON M) 20 MEQ tablet Take 1 tablet (20 mEq total) by mouth daily.   rosuvastatin (CRESTOR) 20 MG tablet Take 1 tablet (20 mg total) by mouth daily.   tirzepatide [facility]) 2.5 MG/0.5ML Pen Inject 2.5 mg into the skin once a week.   Turmeric 450 MG CAPS Take 450 mg by mouth daily.    [DISCONTINUED] hydrochlorothiazide (HYDRODIURIL) 25 MG tablet Take 1 tablet (25 mg total) by mouth daily.   [DISCONTINUED] metoprolol succinate (TOPROL-XL) 50 MG 24 hr tablet Take 1 tablet (50 mg total) by mouth daily with or immediately following a meal. (Patient taking differently: Take 75 mg by mouth daily with lunch. Take 25 mg  1/2 Tablet in The Morning. Take 50 mg in The Evening.)    Allergies: Patient is allergic to  ace inhibitors, oxycodone-acetaminophen, vicodin [hydrocodone-acetaminophen], victoza [liraglutide], and adhesive [tape]. Family History: Patient family history includes Coronary artery disease in her mother; Depression in her mother; Diabetes in her mother; Heart attack in her father and mother; Heart disease in her father and mother; Hyperlipidemia in her mother; Hypertension in her brother, mother, and sister; Kidney disease in her mother; Obesity in her mother; Sleep apnea in her mother; Sudden death in her father. Social History:  Patient  reports that she has never smoked. She has never used smokeless tobacco. She reports current alcohol use. She reports that she does not use drugs.  Review of Systems: Constitutional: Negative for fever malaise or anorexia Cardiovascular: negative for chest pain Respiratory: negative for SOB or persistent cough Gastrointestinal: negative for abdominal pain  Objective  Vitals: BP (!) 160/78   Pulse (!) 57   Temp 97.9 F (36.6 C)   Ht 5\' 6"  (1.676 m)   Wt 266 lb 6.4 oz (120.8 kg)   LMP 05/20/2008   SpO2 97%   BMI 43.00 kg/m  General: no acute distress , A&Ox3,  appears tired, work all night.  Nontoxic, no respiratory distress HEENT: PEERL, conjunctiva normal, neck is supple, no JVD Cardiovascular:  RRR without murmur or gallop.  Respiratory:  Good breath sounds bilaterally, CTAB with normal respiratory effort, no rales Abdomen: Nontender, normal bowel sounds, soft, no masses or hepatosplenomegaly, right lower quadrant is not tender. Skin:  Warm, no rashes Extremities +1 pitting edema to mid calf.  No calf tenderness Lab Results  Component Value Date   WBC 6.4 07/09/2022   HGB 12.9 07/09/2022   HCT 39.5 07/09/2022   MCV 84.6 07/09/2022   PLT 246 07/09/2022   Lab Results  Component Value Date   TSH 1.545 07/09/2022   Lab Results  Component Value Date   DDIMER <0.27 07/09/2022   Lab Results  Component Value Date   CREATININE 0.75 07/09/2022   BUN 16 07/09/2022   NA 136 07/09/2022   K 3.5 07/09/2022   CL 104 07/09/2022   CO2 25 07/09/2022    Commons side effects, risks, benefits, and alternatives for medications and treatment plan prescribed today were discussed, and the patient expressed understanding of the given instructions. Patient is instructed to call or message via MyChart if he/she has any questions or concerns regarding our treatment plan. No barriers to understanding were identified. We discussed Red Flag symptoms and signs in detail. Patient expressed understanding regarding what to do in case of urgent or emergency type symptoms.  Medication list was reconciled, printed and provided to the patient in AVS. Patient instructions and summary information was reviewed with the patient as documented in the AVS. This note was prepared with assistance of Dragon voice recognition software. Occasional wrong-word or sound-a-like substitutions may have occurred due to the inherent limitations of voice recognition software

## 2022-08-09 NOTE — Patient Instructions (Signed)
Please follow up as scheduled for your next visit with me: 09/02/2022   If you have any questions or concerns, please don't hesitate to send me a message via MyChart or call the office at [phone]. Thank you for visiting with [name] today! It's our pleasure caring for you.

## 2022-08-12 ENCOUNTER — Ambulatory Visit (HOSPITAL_COMMUNITY): Payer: 59

## 2022-08-12 ENCOUNTER — Encounter: Payer: Self-pay | Admitting: Internal Medicine

## 2022-08-12 DIAGNOSIS — N924 Excessive bleeding in the premenopausal period: Secondary | ICD-10-CM | POA: Insufficient documentation

## 2022-08-12 DIAGNOSIS — Z8659 Personal history of other mental and behavioral disorders: Secondary | ICD-10-CM | POA: Insufficient documentation

## 2022-08-12 DIAGNOSIS — N840 Polyp of corpus uteri: Secondary | ICD-10-CM | POA: Insufficient documentation

## 2022-08-12 DIAGNOSIS — N898 Other specified noninflammatory disorders of vagina: Secondary | ICD-10-CM | POA: Insufficient documentation

## 2022-08-12 LAB — CA 125: CA 125: 6 U/mL (ref <35)

## 2022-08-13 ENCOUNTER — Ambulatory Visit (HOSPITAL_COMMUNITY): Payer: 59 | Attending: Cardiology

## 2022-08-13 ENCOUNTER — Encounter: Payer: Self-pay | Admitting: Internal Medicine

## 2022-08-13 ENCOUNTER — Other Ambulatory Visit (INDEPENDENT_AMBULATORY_CARE_PROVIDER_SITE_OTHER): Payer: Self-pay | Admitting: Family Medicine

## 2022-08-13 ENCOUNTER — Other Ambulatory Visit: Payer: Self-pay

## 2022-08-13 ENCOUNTER — Other Ambulatory Visit (HOSPITAL_BASED_OUTPATIENT_CLINIC_OR_DEPARTMENT_OTHER): Payer: Self-pay

## 2022-08-13 ENCOUNTER — Ambulatory Visit (AMBULATORY_SURGERY_CENTER): Payer: 59

## 2022-08-13 VITALS — Ht 66.0 in | Wt 255.0 lb

## 2022-08-13 DIAGNOSIS — E1165 Type 2 diabetes mellitus with hyperglycemia: Secondary | ICD-10-CM

## 2022-08-13 DIAGNOSIS — R9431 Abnormal electrocardiogram [ECG] [EKG]: Secondary | ICD-10-CM

## 2022-08-13 DIAGNOSIS — I498 Other specified cardiac arrhythmias: Secondary | ICD-10-CM | POA: Diagnosis not present

## 2022-08-13 DIAGNOSIS — Z1211 Encounter for screening for malignant neoplasm of colon: Secondary | ICD-10-CM

## 2022-08-13 DIAGNOSIS — I471 Supraventricular tachycardia, unspecified: Secondary | ICD-10-CM | POA: Insufficient documentation

## 2022-08-13 DIAGNOSIS — R1310 Dysphagia, unspecified: Secondary | ICD-10-CM

## 2022-08-13 NOTE — Progress Notes (Signed)
No egg or soy allergy known to patient  No issues known to pt with past sedation with any surgeries or procedures - Pt with cpap use for shallow breathing during sleeping  Patient denies ever being told they had issues or difficulty with intubation  No FH of Malignant Hyperthermia Pt is not on diet pills Pt is not on  home 02  Pt is not on blood thinners  Pt denies issues with constipation  No A fib or A flutter but does have PACS Have any cardiac testing pending--Echo on 08/13/22 Pt instructed to use [email or web address] or GoodRx for a price reduction on prep

## 2022-08-14 ENCOUNTER — Other Ambulatory Visit (INDEPENDENT_AMBULATORY_CARE_PROVIDER_SITE_OTHER): Payer: Self-pay | Admitting: Family Medicine

## 2022-08-14 ENCOUNTER — Other Ambulatory Visit (HOSPITAL_BASED_OUTPATIENT_CLINIC_OR_DEPARTMENT_OTHER): Payer: 59

## 2022-08-14 ENCOUNTER — Encounter (INDEPENDENT_AMBULATORY_CARE_PROVIDER_SITE_OTHER): Payer: Self-pay | Admitting: Family Medicine

## 2022-08-14 ENCOUNTER — Other Ambulatory Visit (HOSPITAL_BASED_OUTPATIENT_CLINIC_OR_DEPARTMENT_OTHER): Payer: Self-pay

## 2022-08-14 ENCOUNTER — Other Ambulatory Visit: Payer: Self-pay

## 2022-08-14 ENCOUNTER — Ambulatory Visit (HOSPITAL_BASED_OUTPATIENT_CLINIC_OR_DEPARTMENT_OTHER)
Admission: RE | Admit: 2022-08-14 | Discharge: 2022-08-14 | Disposition: A | Discharge status: Home / Self Care | Payer: 59 | Source: Ambulatory Visit | Attending: Family Medicine | Admitting: Family Medicine

## 2022-08-14 DIAGNOSIS — R1031 Right lower quadrant pain: Secondary | ICD-10-CM | POA: Diagnosis not present

## 2022-08-14 DIAGNOSIS — N3289 Other specified disorders of bladder: Secondary | ICD-10-CM | POA: Diagnosis not present

## 2022-08-14 DIAGNOSIS — E1165 Type 2 diabetes mellitus with hyperglycemia: Secondary | ICD-10-CM

## 2022-08-14 DIAGNOSIS — K760 Fatty (change of) liver, not elsewhere classified: Secondary | ICD-10-CM | POA: Diagnosis not present

## 2022-08-14 DIAGNOSIS — N200 Calculus of kidney: Secondary | ICD-10-CM | POA: Diagnosis not present

## 2022-08-14 DIAGNOSIS — R1011 Right upper quadrant pain: Secondary | ICD-10-CM

## 2022-08-14 LAB — ECHOCARDIOGRAM COMPLETE
Area-P 1/2: 3.27 cm2
S' Lateral: 3.9 cm

## 2022-08-15 ENCOUNTER — Encounter: Payer: Self-pay | Admitting: Family Medicine

## 2022-08-15 ENCOUNTER — Other Ambulatory Visit (HOSPITAL_BASED_OUTPATIENT_CLINIC_OR_DEPARTMENT_OTHER): Payer: Self-pay

## 2022-08-15 ENCOUNTER — Encounter: Payer: Self-pay | Admitting: Cardiovascular Disease

## 2022-08-15 ENCOUNTER — Other Ambulatory Visit (INDEPENDENT_AMBULATORY_CARE_PROVIDER_SITE_OTHER): Payer: Self-pay

## 2022-08-15 ENCOUNTER — Telehealth (INDEPENDENT_AMBULATORY_CARE_PROVIDER_SITE_OTHER): Payer: Self-pay | Admitting: Family Medicine

## 2022-08-15 DIAGNOSIS — E1165 Type 2 diabetes mellitus with hyperglycemia: Secondary | ICD-10-CM

## 2022-08-15 MED ORDER — TIRZEPATIDE 5 MG/0.5ML ~~LOC~~ SOAJ
5.0000 mg | SUBCUTANEOUS | 0 refills | Status: DC
  Filled 2022-08-15: qty 2, 28d supply, fill #0

## 2022-08-15 NOTE — Telephone Encounter (Signed)
Patient states that she cannot get her Alexandra Walker and that it has been denied by Dr. Jeani Sow three times. Pt states that her pharmacy has sent the request for [facility] three times, but each time it has come back as "not appropriate". Please call pt asap at the number on file.

## 2022-08-15 NOTE — Telephone Encounter (Signed)
Spoke w/ pt-CS

## 2022-08-16 ENCOUNTER — Ambulatory Visit (HOSPITAL_BASED_OUTPATIENT_CLINIC_OR_DEPARTMENT_OTHER): Payer: 59

## 2022-08-19 ENCOUNTER — Encounter: Payer: 59 | Admitting: Internal Medicine

## 2022-08-20 ENCOUNTER — Other Ambulatory Visit (HOSPITAL_BASED_OUTPATIENT_CLINIC_OR_DEPARTMENT_OTHER): Payer: Self-pay

## 2022-08-26 ENCOUNTER — Encounter: Payer: Self-pay | Admitting: Internal Medicine

## 2022-08-26 ENCOUNTER — Ambulatory Visit (AMBULATORY_SURGERY_CENTER): Payer: 59 | Admitting: Internal Medicine

## 2022-08-26 VITALS — BP 118/65 | HR 58 | Temp 98.4°F | Resp 16 | Ht 66.0 in | Wt 255.0 lb

## 2022-08-26 DIAGNOSIS — R1310 Dysphagia, unspecified: Secondary | ICD-10-CM | POA: Diagnosis not present

## 2022-08-26 DIAGNOSIS — G4730 Sleep apnea, unspecified: Secondary | ICD-10-CM | POA: Diagnosis not present

## 2022-08-26 DIAGNOSIS — Z1211 Encounter for screening for malignant neoplasm of colon: Secondary | ICD-10-CM | POA: Diagnosis not present

## 2022-08-26 DIAGNOSIS — R7303 Prediabetes: Secondary | ICD-10-CM | POA: Diagnosis not present

## 2022-08-26 DIAGNOSIS — E119 Type 2 diabetes mellitus without complications: Secondary | ICD-10-CM | POA: Diagnosis not present

## 2022-08-26 DIAGNOSIS — K760 Fatty (change of) liver, not elsewhere classified: Secondary | ICD-10-CM | POA: Diagnosis not present

## 2022-08-26 MED ORDER — SODIUM CHLORIDE 0.9 % IV SOLN: 500.0000 mL | Freq: Once | INTRAVENOUS | Status: DC

## 2022-08-26 NOTE — Progress Notes (Signed)
Called to room to assist during endoscopic procedure.  Patient ID and intended procedure confirmed with present staff. Received instructions for my participation in the procedure from the performing physician.  

## 2022-08-26 NOTE — Op Note (Signed)
[facility] Patient Name: Alexandra Walker Procedure Date: 08/26/2022 3:24 PM MRN: [id number] Endoscopist: Iva Boop , MD, [id number] Age: 67 Referring MD:  Date of Birth: January 13, 1956 Gender: Female Account #: [id number] Procedure:                Colonoscopy Indications:              Screening for colorectal malignant neoplasm, Last                            colonoscopy: December 2013 Medicines:                Monitored Anesthesia Care Procedure:                Pre-Anesthesia Assessment:                           - Prior to the procedure, a History and Physical                            was performed, and patient medications and                            allergies were reviewed. The patient's tolerance of                            previous anesthesia was also reviewed. The risks                            and benefits of the procedure and the sedation                            options and risks were discussed with the patient.                            All questions were answered, and informed consent                            was obtained. Prior Anticoagulants: The patient has                            taken no anticoagulant or antiplatelet agents. ASA                            Grade Assessment: II - A patient with mild systemic                            disease. After reviewing the risks and benefits,                            the patient was deemed in satisfactory condition to                            undergo the procedure.  After obtaining informed consent, the colonoscope                            was passed under direct vision. Throughout the                            procedure, the patient's blood pressure, pulse, and                            oxygen saturations were monitored continuously. The                            CF [id number] #[id number] was introduced through the anus                            and advanced to the the  cecum, identified by                            appendiceal orifice and ileocecal valve. The                            colonoscopy was somewhat difficult due to a                            tortuous colon. Successful completion of the                            procedure was aided by straightening and shortening                            the scope to obtain bowel loop reduction, using                            scope torsion and applying abdominal pressure. The                            patient tolerated the procedure well. The quality                            of the bowel preparation was excellent. The                            ileocecal valve, appendiceal orifice, and rectum                            were photographed. The bowel preparation used was                            SUPREP via split dose instruction. Scope In: 3:42:56 PM Scope Out: 3:58:25 PM Scope Withdrawal Time: 0 hours 7 minutes 9 seconds  Total Procedure Duration: 0 hours 15 minutes 29 seconds  Findings:                 The perianal and digital rectal examinations were  normal.                           Multiple diverticula were found in the sigmoid                            colon. There was narrowing of the colon in                            association with the diverticular opening.                           The exam was otherwise without abnormality on                            direct and retroflexion views. Complications:            No immediate complications. Estimated Blood Loss:     Estimated blood loss: none. Impression:               - Diverticulosis in the sigmoid colon. There was                            narrowing of the colon in association with the                            diverticular opening.                           - The examination was otherwise normal on direct                            and retroflexion views.                           - No specimens  collected. Recommendation:           - Patient has a contact number available for                            emergencies. The signs and symptoms of potential                            delayed complications were discussed with the                            patient. Return to normal activities tomorrow.                            Written discharge instructions were provided to the                            patient.                           - Clear liquids x 1 hour then soft foods rest of  day. Start prior diet tomorrow.                           - Continue present medications.                           - Repeat colonoscopy in 10 years for screening                            purposes. Iva Booparl E Julious Langlois, MD 08/26/2022 4:09:03 PM This report has been signed electronically.

## 2022-08-26 NOTE — Patient Instructions (Addendum)
I did not see a ring but dilated the esophagus. EGD was normal.  Colonoscopy showed some diverticulosis, it was angulated in the sigmoid and a little tricky to navigate (happens sometimes) but was overall ok - no polyps, no cancer. Consider a repeat at 76.  I appreciate the opportunity to care for you. Iva Boop, MD, [facility]  Handouts on diverticulosis and post esophageal dilatation diet given.  Recommendation: Patient has a contact number available for                            emergencies. The signs and symptoms of potential                            delayed complications were discussed with the                            patient. Return to normal activities tomorrow.                            Written discharge instructions were provided to the                            patient.                           - Clear liquids x 1 hour then soft foods rest of                            day. Start prior diet tomorrow.                           - Continue present medications.                           - Repeat colonoscopy in 10 years for screening                            purposes.  YOU HAD AN ENDOSCOPIC PROCEDURE TODAY AT THE [facility]:   Refer to the procedure report that was given to you for any specific questions about what was found during the examination.  If the procedure report does not answer your questions, please call your gastroenterologist to clarify.  If you requested that your care partner not be given the details of your procedure findings, then the procedure report has been included in a sealed envelope for you to review at your convenience later.  YOU SHOULD EXPECT: Some feelings of bloating in the abdomen. Passage of more gas than usual.  Walking can help get rid of the air that was put into your GI tract during the procedure and reduce the bloating. If you had a lower endoscopy (such as a colonoscopy or flexible sigmoidoscopy) you may notice spotting of  blood in your stool or on the toilet paper. If you underwent a bowel prep for your procedure, you may not have a normal bowel movement for a few days.  Please Note:  You might notice some irritation and congestion in your nose or some drainage.  This is from the  oxygen used during your procedure.  There is no need for concern and it should clear up in a day or so.  SYMPTOMS TO REPORT IMMEDIATELY:  Following lower endoscopy (colonoscopy or flexible sigmoidoscopy):  Excessive amounts of blood in the stool  Significant tenderness or worsening of abdominal pains  Swelling of the abdomen that is new, acute  Fever of 100F or higher  Following upper endoscopy (EGD)  Vomiting of blood or coffee ground material  New chest pain or pain under the shoulder blades  Painful or persistently difficult swallowing  New shortness of breath  Fever of 100F or higher  Black, tarry-looking stools  For urgent or emergent issues, a gastroenterologist can be reached at any hour by calling [phone]. Do not use MyChart messaging for urgent concerns.    DIET:  We do recommend a small meal at first, but then you may proceed to your regular diet.  Drink plenty of fluids but you should avoid alcoholic beverages for 24 hours.  ACTIVITY:  You should plan to take it easy for the rest of today and you should NOT DRIVE or use heavy machinery until tomorrow (because of the sedation medicines used during the test).    FOLLOW UP: Our staff will call the number listed on your records the next business day following your procedure.  We will call around 7:15- 8:00 am to check on you and address any questions or concerns that you may have regarding the information given to you following your procedure. If we do not reach you, we will leave a message.     If any biopsies were taken you will be contacted by phone or by letter within the next 1-3 weeks.  Please call us at [phone] if you have not heard about the  biopsies in 3 weeks.    SIGNATURES/CONFIDENTIALITY: You and/or your care partner have signed paperwork which will be entered into your electronic medical record.  These signatures attest to the fact that that the information above on your After Visit Summary has been reviewed and is understood.  Full responsibility of the confidentiality of this discharge information lies with you and/or your care-partner.

## 2022-08-26 NOTE — Progress Notes (Signed)
Pt's states no medical or surgical changes since previsit or office visit. 

## 2022-08-26 NOTE — Op Note (Signed)
[facility] Patient Name: Alexandra Walker Procedure Date: 08/26/2022 3:24 PM MRN: [id number] Endoscopist: Iva Boop , MD, [id number] Age: 67 Referring MD:  Date of Birth: 12-13-1955 Gender: Female Account #: [id number] Procedure:                Upper GI endoscopy Indications:              Dysphagia Medicines:                Monitored Anesthesia Care Procedure:                Pre-Anesthesia Assessment:                           - Prior to the procedure, a History and Physical                            was performed, and patient medications and                            allergies were reviewed. The patient's tolerance of                            previous anesthesia was also reviewed. The risks                            and benefits of the procedure and the sedation                            options and risks were discussed with the patient.                            All questions were answered, and informed consent                            was obtained. Prior Anticoagulants: The patient has                            taken no anticoagulant or antiplatelet agents. ASA                            Grade Assessment: III - A patient with severe                            systemic disease. After reviewing the risks and                            benefits, the patient was deemed in satisfactory                            condition to undergo the procedure.                           After obtaining informed consent, the endoscope was  passed under direct vision. Throughout the                            procedure, the patient's blood pressure, pulse, and                            oxygen saturations were monitored continuously. The                            Olympus Scope [id number] was introduced through the                            mouth, and advanced to the second part of duodenum.                            The upper GI endoscopy was  accomplished without                            difficulty. The patient tolerated the procedure                            well. Scope In: Scope Out: Findings:                 No endoscopic abnormality was evident in the                            esophagus to explain the patient's complaint of                            dysphagia. It was decided, however, to proceed with                            dilation of the entire esophagus. The scope was                            withdrawn. Dilation was performed with a Maloney                            dilator with no resistance at 54 Fr. The dilation                            site was examined following endoscope reinsertion                            and showed no change. Estimated blood loss: none.                           The exam was otherwise without abnormality.                           The cardia and gastric fundus were normal on  retroflexion. Complications:            No immediate complications. Estimated Blood Loss:     Estimated blood loss: none. Impression:               - No endoscopic esophageal abnormality to explain                            patient's dysphagia. Esophagus dilated. Dilated.                           - The examination was otherwise normal.                           - No specimens collected. Recommendation:           - Patient has a contact number available for                            emergencies. The signs and symptoms of potential                            delayed complications were discussed with the                            patient. Return to normal activities tomorrow.                            Written discharge instructions were provided to the                            patient.                           - Clear liquids x 1 hour then soft foods rest of                            day. Start prior diet tomorrow.                           - Continue present medications.                            - See the other procedure note for documentation of                            additional recommendations. Iva Boop, MD 08/26/2022 4:04:28 PM This report has been signed electronically.

## 2022-08-26 NOTE — Progress Notes (Unsigned)
[facility] History and Physical   Primary Care Physician:  Willow Ora, MD   Reason for Procedure:   Dysphagia and CRCA screening  Plan:    EGD, esophageal dilation, colonoscopy     HPI: Alexandra Walker is a 67 y.o. female here to evaluate and treat the above problems.   Past Medical History:  Diagnosis Date   ADD (attention deficit disorder)    Allergy    Anticardiolipin syndrome    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   lasted about 6 hours, Echo did not show anything   Bilateral swelling of feet    Cholelithiasis    Chronic depressive disorder    Circadian rhythm sleep disorder, shift work    DDD (degenerative disc disease), 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   Diabetes    Diverticulosis    Minimal   Fatty liver    GERD (gastroesophageal reflux disease)    Heart murmur    Hepatic steatosis    History of hiatal hernia 2017   Small   HLA B27 (HLA B27 positive)    Hx of pancreatitis    Hyperlipidemia    Hypertension    IBS (irritable bowel syndrome)    Ischemic colitis    Lactose intolerance    Lumbar scoliosis 2010   Mild   Migraine headache without aura 05/10/2015   Nephrolithiasis 2014   Left, minimal   Obesity due to excess calories with serious comorbidity 05/24/2019   Palpitations    Prediabetes    Presbyopia    Right bundle branch block    Sepsis 01/2016   Sleep apnea    Sleep hypopnea    Vitamin D deficiency     Past Surgical History:  Procedure Laterality Date   BLADDER SURGERY     BREAST BIOPSY Left    CERVICAL CONE BIOPSY     CHOLECYSTECTOMY N/A 10/26/2014   Procedure: LAPAROSCOPIC CHOLECYSTECTOMY;  Surgeon: Abigail Miyamoto, MD;  Location: [facility];  Service: General;  Laterality: N/A;   COLONOSCOPY W/ BIOPSIES  multiple   CYSTOSCOPY WITH RETROGRADE PYELOGRAM, URETEROSCOPY AND STENT PLACEMENT Left 06/26/2018   Procedure: CYSTOSCOPY WITH RETROGRADE PYELOGRAM, URETEROSCOPY AND STENT PLACEMENT;  Surgeon: Sebastian Ache, MD;   Location: [facility];  Service: Urology;  Laterality: Left;   DIAGNOSTIC LAPAROSCOPY     DILATION AND CURETTAGE OF UTERUS  11/2009   DUPUYTREN CONTRACTURE RELEASE Left    x 3 , Dr Amanda Pea   ESOPHAGOGASTRODUODENOSCOPY  multiple   EYE SURGERY     HOLMIUM LASER APPLICATION Left 06/26/2018   Procedure: HOLMIUM LASER APPLICATION;  Surgeon: Sebastian Ache, MD;  Location: [facility];  Service: Urology;  Laterality: Left;   KNEE ARTHROSCOPY Left 11/2008   LAPAROSCOPIC VAGINAL HYSTERECTOMY WITH SALPINGO OOPHORECTOMY Bilateral 10/03/2015   Procedure: LAPAROSCOPIC ASSISTED VAGINAL HYSTERECTOMY WITH SALPINGECTOMY;  Surgeon: Hal Morales, MD;  Location: [facility];  Service: Gynecology;  Laterality: Bilateral;   LASIK  1990   RK   LEFT FINGER SURGERY     LIVER BIOPSY     MASS EXCISION Left 12/24/2012   Procedure: LEFT EXCISION OF PALMAR MASS X TWO;  Surgeon: Dominica Severin, MD;  Location: [facility];  Service: Orthopedics;  Laterality: Left;   PHOTOREFRACTIVE KERATOTOMY  1994   RADIAL KERATOTOMY  1994    Prior to Admission medications   Medication Sig Start Date End Date Taking? Authorizing Provider  aspirin 325 MG tablet Take 325 mg by mouth daily.   Yes [provider]  cholecalciferol (VITAMIN D) 1000 UNITS tablet Take 2,000 Units by mouth daily.    Yes [provider]  cycloSPORINE (RESTASIS) 0.05 % ophthalmic emulsion Place 1 drop into both eyes 2 times daily. 04/29/22  Yes   furosemide (LASIX) 40 MG tablet Take 1 tablet (40 mg total) by mouth daily. 07/15/22  Yes Kathleene HazelMcAlhany, Christopher D, MD  imiquimod [facility] Dad(ALDARA) 5 % cream Apply to affected area once daily Monday through Friday X6 weeks 07/12/22  Yes   losartan (COZAAR) 100 MG tablet TAKE 1 TABLET (100 MG TOTAL) BY MOUTH DAILY. 10/19/21 11/04/22 Yes Willow OraAndy, Camille L, MD  Lutein 6 MG CAPS Take 6 mg by mouth daily. 10/02/17  Yes [provider]  metoprolol succinate (TOPROL-XL) 50  MG 24 hr tablet Take 1.5 tablets (75 mg total) by mouth in the morning AND 1 tablet (50 mg total) at bedtime. 08/09/22  Yes Willow OraAndy, Camille L, MD  rosuvastatin (CRESTOR) 20 MG tablet Take 1 tablet (20 mg total) by mouth daily. 05/31/22 05/31/23 Yes Willow OraAndy, Camille L, MD  tirzepatide [facility](MOUNJARO) 5 MG/0.5ML Pen Inject 5 mg into the skin once a week. 08/15/22  Yes Langston ReusingUkleja, [name] U, MD  Turmeric 450 MG CAPS Take 450 mg by mouth daily.    Yes [provider]  amphetamine-dextroamphetamine (ADDERALL) 10 MG tablet Take 1 tablet (10 mg total) by mouth daily as needed. 08/05/22   Willow OraAndy, Camille L, MD  diclofenac sodium (VOLTAREN) 1 % GEL  05/30/16   [provider]  fluticasone (FLONASE) 50 MCG/ACT nasal spray Place 1 spray into both nostrils in the morning and at bedtime. 03/28/22   Willow OraAndy, Camille L, MD  Omega-3 1000 MG CAPS Take 1 capsule by mouth daily.  Patient not taking: Reported on 08/13/2022    [provider]  potassium chloride SA (KLOR-CON M) 20 MEQ tablet Take 1 tablet (20 mEq total) by mouth daily. 07/15/22   Kathleene HazelMcAlhany, Christopher D, MD    Current Outpatient Medications  Medication Sig Dispense Refill   aspirin 325 MG tablet Take 325 mg by mouth daily.     cholecalciferol (VITAMIN D) 1000 UNITS tablet Take 2,000 Units by mouth daily.      cycloSPORINE (RESTASIS) 0.05 % ophthalmic emulsion Place 1 drop into both eyes 2 times daily. 180 each 3   furosemide (LASIX) 40 MG tablet Take 1 tablet (40 mg total) by mouth daily. 90 tablet 1   imiquimod (ALDARA) 5 % cream Apply to affected area once daily Monday through Friday X6 weeks 24 each 0   losartan (COZAAR) 100 MG tablet TAKE 1 TABLET (100 MG TOTAL) BY MOUTH DAILY. 90 tablet 3   Lutein 6 MG CAPS Take 6 mg by mouth daily.     metoprolol succinate (TOPROL-XL) 50 MG 24 hr tablet Take 1.5 tablets (75 mg total) by mouth in the morning AND 1 tablet (50 mg total) at bedtime. 90 tablet 3   rosuvastatin (CRESTOR) 20 MG tablet Take 1  tablet (20 mg total) by mouth daily. 90 tablet 3   tirzepatide (MOUNJARO) 5 MG/0.5ML Pen Inject 5 mg into the skin once a week. 2 mL 0   Turmeric 450 MG CAPS Take 450 mg by mouth daily.      amphetamine-dextroamphetamine (ADDERALL) 10 MG tablet Take 1 tablet (10 mg total) by mouth daily as needed. 30 tablet 0   diclofenac sodium (VOLTAREN) 1 % GEL  (Patient not taking: Reported on 08/13/2022)     fluticasone (FLONASE) 50 MCG/ACT nasal  spray Place 1 spray into both nostrils in the morning and at bedtime. 16 g 6   Omega-3 1000 MG CAPS Take 1 capsule by mouth daily.  (Patient not taking: Reported on 08/13/2022)     potassium chloride SA (KLOR-CON M) 20 MEQ tablet Take 1 tablet (20 mEq total) by mouth daily. 90 tablet 1   Current Facility-Administered Medications  Medication Dose Route Frequency Provider Last Rate Last Admin   0.9 %  sodium chloride infusion  500 mL Intravenous Once Iva Boop, MD        Allergies as of 08/26/2022 - Review Complete 08/26/2022  Allergen Reaction Noted   Ace inhibitors Other (See Comments) 09/05/2015   Dust mite extract Other (See Comments) 12/29/2020   Oxycodone hcl Other (See Comments) and Nausea And Vomiting 04/06/2018   Oxycodone-acetaminophen Nausea And Vomiting 09/19/2011   Tree extract Other (See Comments) 08/12/2022   Vicodin [hydrocodone-acetaminophen] Nausea And Vomiting 09/19/2011   Victoza [liraglutide] Other (See Comments) 09/01/2014   Adhesive [tape] Rash 06/18/2018    Family History  Problem Relation Age of Onset   Coronary artery disease Mother    Hypertension Mother    Heart disease Mother    Heart attack Mother    Diabetes Mother    Hyperlipidemia Mother    Kidney disease Mother    Depression Mother    Sleep apnea Mother    Obesity Mother    Heart attack Father    Heart disease Father    Sudden death Father    Hypertension Sister    Hypertension Brother    Colon cancer Neg Hx    Colon polyps Neg Hx    Esophageal cancer Neg  Hx    Rectal cancer Neg Hx    Stomach cancer Neg Hx     Social History   Socioeconomic History   Marital status: Divorced    Spouse name: Not on file   Number of children: 1   Years of education: Not on file   Highest education level: Not on file  Occupational History   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   Social Determinants of Health   Financial Resource Strain: Not on file  Food Insecurity: Not on file  Transportation Needs: Not on file  Physical Activity: Not on file  Stress: Not on file  Social Connections: Not on file  Intimate Partner Violence: Not on file    Review of Systems:  All other review of systems negative except as mentioned in the HPI.  Physical Exam: Vital signs BP (!) 148/80   Pulse 61   Temp 98.4 F (36.9 C) (Temporal)   Resp 11   Ht 5\' 6"  (1.676 m)   Wt 255 lb (115.7 kg)   LMP 05/20/2008   SpO2 100%   BMI 41.16 kg/m   General:   Alert,  Well-developed, well-nourished, pleasant and cooperative in NAD Lungs:  Clear throughout to auscultation.   Heart:  Regular rate and rhythm; no  murmurs, clicks, rubs,  or gallops. Abdomen:  Soft, nontender and nondistended. Normal bowel sounds.   Neuro/Psych:  Alert and cooperative. Normal mood and affect. A and O x 3   @Maryalice Pasley  Sena Slate, MD, [facility] [phone] (pager) 08/26/2022 3:24 PM@

## 2022-08-26 NOTE — Progress Notes (Unsigned)
To pacu, VSS. Report to [email or web address] 

## 2022-08-27 ENCOUNTER — Telehealth: Payer: Self-pay | Admitting: *Deleted

## 2022-08-27 NOTE — Telephone Encounter (Signed)
  Follow up Call-     08/26/2022    2:50 PM  Call back number  Post procedure Call Back phone  # [phone]  Permission to leave phone message Yes     Patient questions:  Do you have a fever, pain , or abdominal swelling? No. Pain Score  0 *  Have you tolerated food without any problems? Yes.    Have you been able to return to your normal activities? Yes.    Do you have any questions about your discharge instructions: Diet   No. Medications  No. Follow up visit  No.  Do you have questions or concerns about your Care? Yes.    Actions: * If pain score is 4 or above: No action needed, pain <4.

## 2022-09-02 ENCOUNTER — Ambulatory Visit (INDEPENDENT_AMBULATORY_CARE_PROVIDER_SITE_OTHER): Payer: 59 | Admitting: Family Medicine

## 2022-09-02 VITALS — BP 128/78 | HR 66 | Temp 98.3°F | Ht 66.0 in | Wt 261.6 lb

## 2022-09-02 DIAGNOSIS — E1165 Type 2 diabetes mellitus with hyperglycemia: Secondary | ICD-10-CM

## 2022-09-02 DIAGNOSIS — E782 Mixed hyperlipidemia: Secondary | ICD-10-CM

## 2022-09-02 DIAGNOSIS — I10 Essential (primary) hypertension: Secondary | ICD-10-CM | POA: Diagnosis not present

## 2022-09-02 DIAGNOSIS — R600 Localized edema: Secondary | ICD-10-CM

## 2022-09-02 DIAGNOSIS — I471 Supraventricular tachycardia, unspecified: Secondary | ICD-10-CM | POA: Diagnosis not present

## 2022-09-02 DIAGNOSIS — R1031 Right lower quadrant pain: Secondary | ICD-10-CM

## 2022-09-02 DIAGNOSIS — K222 Esophageal obstruction: Secondary | ICD-10-CM | POA: Insufficient documentation

## 2022-09-02 DIAGNOSIS — R002 Palpitations: Secondary | ICD-10-CM

## 2022-09-02 LAB — POCT GLYCOSYLATED HEMOGLOBIN (HGB A1C): Hemoglobin A1C: 5.7 % — AB (ref 4.0–5.6)

## 2022-09-02 NOTE — Patient Instructions (Signed)
Please return in 6 months for your annual complete physical; please come fasting.   If you have any questions or concerns, please don't hesitate to send me a message via MyChart or call the office at [phone]. Thank you for visiting with us today! It's our pleasure caring for you.  

## 2022-09-02 NOTE — Progress Notes (Signed)
Subjective  CC: No chief complaint on file.   HPI: Alexandra Walker is a 67 y.o. female who presents to the office today for follow up of diabetes and problems listed above in the chief complaint.  Diabetes follow up: Her diabetic control is reported as Unchanged. Doing fine on mounjaro. Seeing healthy weight and wellness also.  She denies exertional CP or SOB or symptomatic hypoglycemia. She denies foot sores or paresthesias.  Chest pain: reviewed cards eval. Reviewed echocardiogram. Mild atrial enlargement otherwise ok. No more chest sxs. Reviewed colonoscopy and egd reports: s/p esophageal dilation but normal gastric mucosa; colonoscopy w/ tics but otherwise ok. No more RLQ pain: ? Related to bladder sling or constipation.  Reviewed nl ultrasound findings.  HTN: doing well on meds. Using lasix for peripheral edema. Feels it helps. Losartan and toprol xl for bp  Wt Readings from Last 3 Encounters:  09/02/22 261 lb 9.6 oz (118.7 kg)  08/26/22 255 lb (115.7 kg)  08/13/22 255 lb (115.7 kg)    BP Readings from Last 3 Encounters:  09/02/22 128/78  08/26/22 118/65  08/09/22 (!) 160/78    Assessment  1. Type 2 diabetes mellitus with hyperglycemia, without long-term current use of insulin   2. Essential hypertension   3. Mixed hyperlipidemia   4. Palpitations   5. Bilateral lower extremity edema   6. Right lower quadrant pain   7. Paroxysmal SVT (supraventricular tachycardia)      Plan  Diabetes is currently very well controlled.  HTN is controlled Hld at goal Psvt/palpitations: resolved. Heart eval negative. Lasix for edema.  No more abdominal pain: ? IBS or other. Improved.   Follow up: 6 mo for cpe. Orders Placed This Encounter  Procedures   POCT HgB A1C   No orders of the defined types were placed in this encounter.     Immunization History  Administered Date(s) Administered   Influenza, High Dose Seasonal PF 01/21/2013   Influenza,inj,Quad PF,6+ Mos 01/21/2013,  03/09/2018, 03/19/2019, 03/13/2021, 02/26/2022   Influenza,inj,quad, With Preservative 02/18/2015   Influenza-Unspecified 02/18/2015, 02/23/2016, 03/13/2017, 03/20/2020   Moderna Sars-Covid-2 Vaccination 04/20/2019   PFIZER(Purple Top)SARS-COV-2 Vaccination 05/15/2019, 06/05/2019   PNEUMOCOCCAL CONJUGATE-20 05/07/2021   Pneumococcal Polysaccharide-23 08/01/2008   Rabies Immune Globulin 10/14/2011, 10/17/2011, 10/23/2011, 10/31/2011   Rabies, IM 10/14/2011, 10/17/2011, 10/23/2011, 10/31/2011   Rabies, intradermal 10/14/2011   Tdap 06/08/2007, 11/01/2019   Zoster Recombinat (Shingrix) 11/19/2021, 05/31/2022    Diabetes Related Lab Review: Lab Results  Component Value Date   HGBA1C 5.7 (A) 09/02/2022   HGBA1C 5.7 (A) 05/27/2022   HGBA1C 6.2 (H) 09/03/2021    Lab Results  Component Value Date   MICROALBUR 1.8 05/27/2022   Lab Results  Component Value Date   CREATININE 0.86 08/09/2022   BUN 24 (H) 08/09/2022   NA 141 08/09/2022   K 4.2 08/09/2022   CL 105 08/09/2022   CO2 28 08/09/2022   Lab Results  Component Value Date   CHOL 144 05/27/2022   CHOL 134 09/03/2021   CHOL 145 05/24/2021   Lab Results  Component Value Date   HDL 38.60 (L) 05/27/2022   HDL 39 (L) 09/03/2021   HDL 47 05/24/2021   Lab Results  Component Value Date   LDLCALC 75 05/27/2022   LDLCALC 70 09/03/2021   LDLCALC 77 05/24/2021   Lab Results  Component Value Date   TRIG 152.0 (H) 05/27/2022   TRIG 141 09/03/2021   TRIG 114 05/24/2021   Lab Results  Component  Value Date   CHOLHDL 4 05/27/2022   CHOLHDL 3 11/13/2020   CHOLHDL 4 08/30/2019   Lab Results  Component Value Date   LDLDIRECT 118.0 05/24/2019   The 10-year ASCVD risk score (Arnett DK, et al., 2019) is: 12.3%   Values used to calculate the score:     Age: 57 years     Sex: Female     Is Non-Hispanic African American: No     Diabetic: Yes     Tobacco smoker: No     Systolic Blood Pressure: 128 mmHg     Is BP treated:  Yes     HDL Cholesterol: 38.6 mg/dL     Total Cholesterol: 144 mg/dL I have reviewed the PMH, Fam and Soc history. Patient Active Problem List   Diagnosis Date Noted   Controlled type 2 diabetes mellitus without complication, without long-term current use of insulin 05/27/2022    Priority: High   Morbid obesity 08/07/2020    Priority: High   Attention deficit hyperactivity disorder (ADHD), combined type 10/19/2017    Priority: High   OSA on CPAP, followed by Neurology/Sleep 09/26/2016    Priority: High    Dr. Vickey Huger. Sleep test repeat: 2023, mild, continue cpap. Auto titrated    Essential hypertension 01/31/2016    Priority: High   Mixed hyperlipidemia 05/10/2015    Priority: High   Paroxysmal SVT (supraventricular tachycardia) 09/19/2011    Priority: High    SVT/PACs controlled on BB; managed by cardiology. No CAD    Esophageal stricture 09/02/2022    Priority: Medium     EGD 08/2022: dilated. Otherwise normal    Bilateral hip bursitis 05/06/2019    Priority: Medium    Nephrolithiasis 03/29/2018    Priority: Medium     H/o recurrent pyelo/urosepsis; s/p stone retrieval and stenting.  Has nonobstructive stone that persists.  Dr. Berneice Heinrich    Anticardiolipin syndrome     Priority: Medium    Recurrent circadian rhythm sleep disorder, shift work type 03/31/2017    Priority: Medium    IBS (irritable bowel syndrome), diarrhea predominant 03/17/2017    Priority: Medium    Hepatic steatosis 09/26/2016    Priority: Medium    Bilateral lower extremity edema 09/02/2022    Priority: Low   Diverticulosis of colon 05/16/2018    Priority: Low    Colonoscopy 08/2022: tics throughout, o/w normal.    Drug-induced pancreatitis, 2015, thought to be due to [facility] 03/29/2018    Priority: Low   History of vitreous detachment OU, HTN retinopathy OU, High myopia OU, Cataracts OU, Vitreous floaters OU, Keratoconjunctivitis sicca OU not specified as Sjogren's 03/29/2018    Priority: Low    History of sepsis 02/01/2016    Priority: Low    Due to urosepsis: had acute short lived afib/psvt. Neg cardiac eval since.     Vitamin D deficiency, on Vit D 2000 IU daily 05/10/2015    Priority: Low    Social History: Patient  reports that she has never smoked. She has never used smokeless tobacco. She reports current alcohol use. She reports that she does not use drugs.  Review of Systems: Ophthalmic: negative for eye pain, loss of vision or double vision Cardiovascular: negative for chest pain Respiratory: negative for SOB or persistent cough Gastrointestinal: negative for abdominal pain Genitourinary: negative for dysuria or gross hematuria MSK: negative for foot lesions Neurologic: negative for weakness or gait disturbance  Objective  Vitals: BP 128/78   Pulse 66   Temp 98.3  F (36.8 C)   Ht 5\' 6"  (1.676 m)   Wt 261 lb 9.6 oz (118.7 kg)   LMP 05/20/2008   SpO2 98%   BMI 42.22 kg/m  General: well appearing, no acute distress  Psych:  Alert and oriented, normal mood and affect   Diabetic education: ongoing education regarding chronic disease management for diabetes was given today. We continue to reinforce the ABC's of diabetic management: A1c (<7 or 8 dependent upon patient), tight blood pressure control, and cholesterol management with goal LDL < 100 minimally. We discuss diet strategies, exercise recommendations, medication options and possible side effects. At each visit, we review recommended immunizations and preventive care recommendations for diabetics and stress that good diabetic control can prevent other problems. See below for this patient's data.   Commons side effects, risks, benefits, and alternatives for medications and treatment plan prescribed today were discussed, and the patient expressed understanding of the given instructions. Patient is instructed to call or message via MyChart if he/she has any questions or concerns regarding our treatment plan. No  barriers to understanding were identified. We discussed Red Flag symptoms and signs in detail. Patient expressed understanding regarding what to do in case of urgent or emergency type symptoms.  Medication list was reconciled, printed and provided to the patient in AVS. Patient instructions and summary information was reviewed with the patient as documented in the AVS. This note was prepared with assistance of Dragon voice recognition software. Occasional wrong-word or sound-a-like substitutions may have occurred due to the inherent limitations of voice recognition software

## 2022-09-03 ENCOUNTER — Other Ambulatory Visit (HOSPITAL_BASED_OUTPATIENT_CLINIC_OR_DEPARTMENT_OTHER): Payer: Self-pay

## 2022-09-03 ENCOUNTER — Encounter (INDEPENDENT_AMBULATORY_CARE_PROVIDER_SITE_OTHER): Payer: Self-pay | Admitting: Family Medicine

## 2022-09-03 ENCOUNTER — Ambulatory Visit (INDEPENDENT_AMBULATORY_CARE_PROVIDER_SITE_OTHER): Payer: 59 | Admitting: Family Medicine

## 2022-09-03 VITALS — BP 122/61 | HR 66 | Temp 97.9°F | Ht 66.0 in | Wt 257.0 lb

## 2022-09-03 DIAGNOSIS — E785 Hyperlipidemia, unspecified: Secondary | ICD-10-CM

## 2022-09-03 DIAGNOSIS — E669 Obesity, unspecified: Secondary | ICD-10-CM

## 2022-09-03 DIAGNOSIS — E1165 Type 2 diabetes mellitus with hyperglycemia: Secondary | ICD-10-CM | POA: Diagnosis not present

## 2022-09-03 DIAGNOSIS — Z6841 Body Mass Index (BMI) 40.0 and over, adult: Secondary | ICD-10-CM

## 2022-09-03 DIAGNOSIS — Z7985 Long-term (current) use of injectable non-insulin antidiabetic drugs: Secondary | ICD-10-CM

## 2022-09-03 DIAGNOSIS — E1169 Type 2 diabetes mellitus with other specified complication: Secondary | ICD-10-CM

## 2022-09-03 MED ORDER — TIRZEPATIDE 5 MG/0.5ML ~~LOC~~ SOAJ
5.0000 mg | SUBCUTANEOUS | 0 refills | Status: DC
  Filled 2022-09-03 – 2022-09-09 (×2): qty 2, 28d supply, fill #0

## 2022-09-03 NOTE — Progress Notes (Signed)
Chief Complaint:   OBESITY Alexandra Walker is here to discuss her progress with her obesity treatment plan along with follow-up of her obesity related diagnoses. Alexandra Walker is on keeping a food journal and adhering to recommended goals of 1500-1600 calories and 95+ protein and states she is following her eating plan approximately 60-70% of the time. Alexandra Walker states she is yard work 120 minutes 2 times per week.  Today's visit was #: 15 Starting weight: 265 LBS Starting date: 05/24/2021 (restart) Today's weight: 257 lbs Today's date: 09/03/2022 Total lbs lost to date: 8 LBS Total lbs lost since last in-office visit: 1 LB  Interim History: Patient A1c yesterday 5.7.  She had a colonoscopy last week and it was normal.  She isn't getting her protein in daily due to lack of energy and sometimes lack of hunger.  She thinks sometimes it is related to fatigue with changing her sleep cycle. Her new obsession is starbucks coffee concentrate that she mixes with the milk. She voices that she recognizes she hasn't been as compliant to the plan as she knows she needs to be.    Subjective:   1. Type 2 diabetes mellitus with hyperglycemia, without long-term current use of insulin Patient last A1c was 5.7.Patient is on Mounjaro with no GI side effect noted.  Patient recognizes that she still occasionally misses meals.  2. Hyperlipidemia associated with type 2 diabetes mellitus Patient is on Crestor 20 mg daily.  Last LDL was 75.  Assessment/Plan:   1. Type 2 diabetes mellitus with hyperglycemia, without long-term current use of insulin Refill- tirzepatide (MOUNJARO) 5 MG/0.5ML Pen; Inject 5 mg into the skin once a week.  Dispense: 2 mL; Refill: 0  2. Hyperlipidemia associated with type 2 diabetes mellitus Continue Crestor no change in dose.  3. BMI 40.0-44.9, adult  4. Obesity with starting BMI of 42.7 Alexandra Walker is currently in the action stage of change. As such, her goal is to continue with weight loss efforts.  She has agreed to keeping a food journal and adhering to recommended goals of 1500-1600 calories and 95+ protein daily.  Exercise goals: All adults should avoid inactivity. Some physical activity is better than none, and adults who participate in any amount of physical activity gain some health benefits.  Behavioral modification strategies: increasing lean protein intake, meal planning and cooking strategies, keeping healthy foods in the home, and planning for success.  Alexandra Walker has agreed to follow-up with our clinic in 4-5 weeks. She was informed of the importance of frequent follow-up visits to maximize her success with intensive lifestyle modifications for her multiple health conditions.   Objective:   Blood pressure 122/61, pulse 66, temperature 97.9 F (36.6 C), height  (1.676 m), weight 257 lb (116.6 kg), last menstrual period 05/20/2008, SpO2 99 %. Body mass index is 41.48 kg/m.  General: Cooperative, alert, well developed, in no acute distress. HEENT: Conjunctivae and lids unremarkable. Cardiovascular: Regular rhythm.  Lungs: Normal work of breathing. Neurologic: No focal deficits.   Lab Results  Component Value Date   CREATININE 0.86 08/09/2022   BUN 24 (H) 08/09/2022   NA 141 08/09/2022   K 4.2 08/09/2022   CL 105 08/09/2022   CO2 28 08/09/2022   Lab Results  Component Value Date   ALT 17 08/09/2022   AST 13 08/09/2022   ALKPHOS 75 08/09/2022   BILITOT 0.4 08/09/2022   Lab Results  Component Value Date   HGBA1C 5.7 (A) 09/02/2022   HGBA1C 5.7 (A)  05/27/2022   HGBA1C 6.2 (H) 09/03/2021   HGBA1C 6.3 (A) 05/07/2021   HGBA1C 6.8 (H) 11/13/2020   Lab Results  Component Value Date   INSULIN 14.6 09/03/2021   INSULIN 12.5 05/24/2021   INSULIN 12.5 03/13/2017   INSULIN 10.0 10/28/2016   Lab Results  Component Value Date   TSH 1.545 07/09/2022   Lab Results  Component Value Date   CHOL 144 05/27/2022   HDL 38.60 (L) 05/27/2022   LDLCALC 75 05/27/2022    LDLDIRECT 118.0 05/24/2019   TRIG 152.0 (H) 05/27/2022   CHOLHDL 4 05/27/2022   Lab Results  Component Value Date   VD25OH 46.6 09/03/2021   VD25OH 50.7 05/24/2021   VD25OH 58.00 11/13/2020   Lab Results  Component Value Date   WBC 6.2 08/09/2022   HGB 12.6 08/09/2022   HCT 38.4 08/09/2022   MCV 84.7 08/09/2022   PLT 213.0 08/09/2022   No results found for: "IRON", "TIBC", "FERRITIN"  Attestation Statements:   Reviewed by clinician on day of visit: allergies, medications, problem list, medical history, surgical history, family history, social history, and previous encounter notes.  I, Malcolm Metro, RMA, am acting as transcriptionist for Reuben Likes, MD.  I have reviewed the above documentation for accuracy and completeness, and I agree with the above. - Reuben Likes, MD

## 2022-09-09 ENCOUNTER — Other Ambulatory Visit (HOSPITAL_BASED_OUTPATIENT_CLINIC_OR_DEPARTMENT_OTHER): Payer: Self-pay

## 2022-09-10 ENCOUNTER — Other Ambulatory Visit (HOSPITAL_BASED_OUTPATIENT_CLINIC_OR_DEPARTMENT_OTHER): Payer: Self-pay

## 2022-09-16 ENCOUNTER — Other Ambulatory Visit: Payer: Self-pay | Admitting: Family Medicine

## 2022-09-16 ENCOUNTER — Other Ambulatory Visit (HOSPITAL_BASED_OUTPATIENT_CLINIC_OR_DEPARTMENT_OTHER): Payer: Self-pay

## 2022-09-16 DIAGNOSIS — I152 Hypertension secondary to endocrine disorders: Secondary | ICD-10-CM

## 2022-09-16 DIAGNOSIS — I491 Atrial premature depolarization: Secondary | ICD-10-CM

## 2022-09-16 MED ORDER — LOSARTAN POTASSIUM 100 MG PO TABS
100.0000 mg | ORAL_TABLET | Freq: Every day | ORAL | 3 refills | Status: DC
  Filled 2022-09-16 – 2022-11-04 (×2): qty 90, 90d supply, fill #0
  Filled 2022-12-08 – 2023-01-27 (×2): qty 90, 90d supply, fill #1
  Filled 2023-03-08 – 2023-05-17 (×3): qty 90, 90d supply, fill #2
  Filled 2023-08-11: qty 90, 90d supply, fill #3

## 2022-09-17 ENCOUNTER — Other Ambulatory Visit (HOSPITAL_BASED_OUTPATIENT_CLINIC_OR_DEPARTMENT_OTHER): Payer: Self-pay

## 2022-09-23 ENCOUNTER — Other Ambulatory Visit (HOSPITAL_BASED_OUTPATIENT_CLINIC_OR_DEPARTMENT_OTHER): Payer: Self-pay

## 2022-09-23 ENCOUNTER — Other Ambulatory Visit: Payer: Self-pay

## 2022-09-23 ENCOUNTER — Encounter: Payer: Self-pay | Admitting: Family Medicine

## 2022-09-23 ENCOUNTER — Other Ambulatory Visit: Payer: Self-pay | Admitting: Family Medicine

## 2022-09-23 DIAGNOSIS — E1159 Type 2 diabetes mellitus with other circulatory complications: Secondary | ICD-10-CM

## 2022-09-23 DIAGNOSIS — I491 Atrial premature depolarization: Secondary | ICD-10-CM

## 2022-09-23 MED ORDER — METOPROLOL SUCCINATE ER 50 MG PO TB24
50.0000 mg | ORAL_TABLET | Freq: Every day | ORAL | 3 refills | Status: DC
  Filled 2022-09-23 – 2022-11-04 (×2): qty 90, 90d supply, fill #0
  Filled 2023-05-17: qty 90, 90d supply, fill #1

## 2022-09-23 MED ORDER — METOPROLOL SUCCINATE ER 50 MG PO TB24
ORAL_TABLET | ORAL | 3 refills | Status: DC
  Filled 2022-09-23 – 2022-10-04 (×2): qty 90, 36d supply, fill #0
  Filled 2023-01-27: qty 90, 36d supply, fill #1
  Filled 2023-03-08 – 2023-04-28 (×2): qty 90, 36d supply, fill #2

## 2022-09-26 ENCOUNTER — Other Ambulatory Visit (HOSPITAL_BASED_OUTPATIENT_CLINIC_OR_DEPARTMENT_OTHER): Payer: Self-pay

## 2022-09-27 ENCOUNTER — Ambulatory Visit: Payer: 59 | Admitting: Cardiovascular Disease

## 2022-10-03 ENCOUNTER — Other Ambulatory Visit (HOSPITAL_BASED_OUTPATIENT_CLINIC_OR_DEPARTMENT_OTHER): Payer: Self-pay

## 2022-10-04 ENCOUNTER — Other Ambulatory Visit (HOSPITAL_BASED_OUTPATIENT_CLINIC_OR_DEPARTMENT_OTHER): Payer: Self-pay

## 2022-10-07 ENCOUNTER — Ambulatory Visit (INDEPENDENT_AMBULATORY_CARE_PROVIDER_SITE_OTHER): Payer: 59 | Admitting: Neurology

## 2022-10-07 ENCOUNTER — Encounter: Payer: Self-pay | Admitting: Neurology

## 2022-10-07 VITALS — BP 153/97 | HR 64 | Ht 66.0 in | Wt 259.6 lb

## 2022-10-07 DIAGNOSIS — G4763 Sleep related bruxism: Secondary | ICD-10-CM | POA: Diagnosis not present

## 2022-10-07 DIAGNOSIS — G4726 Circadian rhythm sleep disorder, shift work type: Secondary | ICD-10-CM | POA: Insufficient documentation

## 2022-10-07 DIAGNOSIS — G4733 Obstructive sleep apnea (adult) (pediatric): Secondary | ICD-10-CM | POA: Diagnosis not present

## 2022-10-07 DIAGNOSIS — Z833 Family history of diabetes mellitus: Secondary | ICD-10-CM | POA: Insufficient documentation

## 2022-10-07 NOTE — Progress Notes (Addendum)
Provider:  Melvyn Novas, MD  Primary Care Physician:  Willow Ora, MD [address] [place] [address]     Referring Provider: Willow Ora, Md [address] 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 Has HTN, DM, Obesity. HST from 06-25-2022 showed no hypoxia, Calculated pAHI (per hour): 13.7/h during REM sleep 20.9/h and in non-REM sleep 10.2/h. NEW CPAP machine was issued , here for first follow up.                       Alexandra Walker has been a compliant CPAP user and has used the machine 29 out of 30 days and this 30 days period from April 17 through May 16.  The average user time is 6 hours 1 minutes.  Her AutoSet is an AirSense 11 machine with a minimum pressure of 6 and maximum pressure of 14 and 2 cm water pressure relief on expiration.  The residual AHI is only 0.2/h which is an excellent control there are no central apneas arising.  The 95th percentile pressure is 7.6 cm water. On Mounjaro now, not yet noting any weight loss.  The patient had an ED visit related to palpitations that turned out to be supraventricular tachycardia, she had been retaining fluids and had developed ankle edema.  She was placed on the diuretic furosemide to be used daily for now.     04-09-2021, RV for my established sleep patient, Alexandra Walker.  She contracted Covid in July and was feeling pretty back to baseline within less than a week. She is here because her CPAP machine is going to be 65 years old now and will be replaced, I will order a new HST baseline.  She was doing well at 8 cm water, 2 cm EPR. Her daughter is moving as an P OT to a [place] service in [place], she is looking forward to  an empty nest      08-07-2020, Interval history for Alexandra Walker, Nurse Midwife, She has the usual lower compliance based on her on-call history-  She is not sleepy while working nights.  Has a cumulative sleep deficit at the week end , making up by sleeping `12 hours off call- live style is a 4 day weekend.  I have the pleasure of also see that her CPAP which is still set at 8 cmH2O with 3 cm EPR has completely resolved her apnea her residual AHI is again excellent at 0.2/h.  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   So I do not need to make any adjustments her air sense AutoSet machine however will be soon 67 years old I usually check on a home sleep test just before I order a new machine and that will be next year.   She has endorsed the fatigue severity score at 31 points and the Epworth sleepiness  score at 4 points, she endorsed 5 out of 15 points on a depression score.   New machine due in 2023.        08-09-2019: Alexandra Walker. 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 She has been struggling with obesity ( BMI is now 38.7). Alexandra Walker is a compliant CPAP user, she is using an air sense 10 machine with a serial #[id number]. 100% Percent compliance by the number of days used- but since she is a night shift worker there are only 23 days where she could sleep through with her CPAP in use-77%.  This is an expected ratio.   Her average usage on days used is 5 hours 9 minutes, the CPAP is set to 8 cm pressure with 3 cm EPR and her AHI is only 0.2/h which speaks for an excellent resolution. She has no central apneas emerging the 95th percentile pressure cannot be determined since the machine is not used as an auto titrate her.  She has minimal air leakage which speaks for very good mask fit. She is not daytime sleepy,  feels refreshed and alert.  Her job has not been more stressful, she sees 3-5 Covid positive - pregnancies pr week, usually learning the COVID status just after the admission-  . She is vaccinated and was boosted.  Her daughter had Covid in February, last month.        08-05-2018. 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  It was a busy night again she reports.   Patient with anticardiolipid ab syndrome, atrial fib, DM 2,   Alexandra Walker is a 67 y.o. female [employer] employee, she just returned form a conference on [place]. 100 people. She came home on Sunday the 15th.  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  The patient has an overall compliance of 27 out of 30 days, she used the machine over 4 hours on 63% of these days.  This is also due to her restricted daytime ability to sleep.  For a night shift worker this is a good compliance.  The machine has a set pressure of 8 cmH2O with 3 cm EPR and her residual AHI is 0.2 excellent resolution no air leaks, I am very happy with his compliance.  Her depression score was endorsed at 3 out of 15 points, the Epworth sleepiness score was endorsed at 5 points fatigue severity score at 34 points. I did renew Alexandra Walker CPAP supply prescription for the coming year.       Interval history from 09/26/2016. Alexandra Walker returned after I had  ordered a CPAP titration for her based on a recent referral by Alexandra Walker, Alexandra Walker.  I had seen the patient already in 2007- but at the time she could not tolerate CPAP in a SPLIT study.    The patient was in  9/ 2017 admitted with a urosepsis, due to kidney stone infection- pyelonephritis, with Klebsiella-  The sleep study was ordered based on a baseline polysomnography performed at the  [facility] sleep center in December 2017, which had diagnosed the patient with sleep apnea, hypopnea at an AHI of 21 per hour, moderate hypoxemia of sleep, mean saturation was only 87% SPO2  raising the question of an underlying cardiopulmonary disease but probably hypoventilation due to obesity. The patient was titrated to 8 cm water pressure the AHI was 0.9 per hour, she did not produce any periodic limb movements oxygen nadir rose to 85% of the time spent at 88% saturation or below equal at only 25 minutes. She was fitted with a nasal pillow while using a mouthguard.   As the pleasure to look at the patient's compliance record today, which is excellent she has used to machine 28 out of 30 days with a 77% compliance but hours. 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 Average user time is 5 hours 21 minutes, residual apnea index is 0.3 the 95th percentile pressure has been met, she does not have air leaks. I would like for her to continue with the current machine and settings as well as the interface. The patient also reported that her hepatologist at [facility] voiced approval of CPAP therapy for all patients with fatty liver disease. I hope that the CPAP will help her to reduce body weight, promote sleep before midnight allowing her to have more balanced insulin household, and continues to allow her be less daytime sleepy. She endorsed today the Epworth sleepiness score at only 5 points and fatigue severity score was 36 points. She has visibly lost weight, her face and neck are smaller and I adjusted the measurements in my physical exam note. I will order a ONO for her  After she mentioned that her dentist felt continuous clenching represents hypoxemia- or a reaction to low oxygen.    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 She was  exhausted.    Review of Systems: Out of a complete 14 system review, the patient complains of only the following symptoms, and all other reviewed systems are negative.:   Shift work, OSA on CPAP.    How likely are you to doze in the following situations: 0 = not likely, 1 = slight chance, 2 = moderate chance, 3 = high chance   Sitting and Reading? Watching Television? Sitting inactive in a public place (theater or meeting)? As a passenger in a car for an hour without a break? Lying down in the afternoon when circumstances permit? Sitting and talking to someone? Sitting quietly after lunch without alcohol? In a car, while stopped for a few minutes in traffic?   Total = 4/ 24 points   FSS endorsed at 31/ 63 points.   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   Social Determinants of Health   Financial Resource Strain: Low Risk  (09/02/2022)   Overall Financial Resource Strain (CARDIA)    Difficulty of Paying Living Expenses: Not hard at all  Food Insecurity: No Food Insecurity (09/02/2022)   Hunger Vital Sign    Worried About Running Out of Food in the Last Year: Never true    Ran Out of Food in the Last Year: Never true  Transportation Needs: No Transportation Needs (09/02/2022)   PRAPARE  - Administrator, Civil Service (Medical): No    Lack of Transportation (Non-Medical): No  Physical Activity: Insufficiently Active (09/02/2022)   Exercise Vital Sign    Days of Exercise per Week: 1 day    Minutes of Exercise per Session: 40 min  Stress: No Stress Concern Present (09/02/2022)   [employer] of Occupational Health - Occupational Stress Questionnaire    Feeling of Stress : Only a little  Social Connections: Moderately Integrated (09/02/2022)   Social Connection and Isolation Panel [NHANES]    Frequency of Communication with Friends and Family: Three times a week    Frequency of Social Gatherings with Friends and Family: Once a week    Attends Religious Services: More than 4 times per year    Active Member of [employer] or Organizations: Yes    Attends Engineer, structural: More than 4 times per year    Marital Status: Divorced    Family History  Problem Relation Age of Onset   Coronary artery disease Mother    Hypertension Mother    Heart disease Mother    Heart attack Mother    Diabetes Mother    Hyperlipidemia Mother    Kidney disease Mother    Depression Mother    Sleep apnea Mother    Obesity Mother    Heart attack Father    Heart disease Father    Sudden death Father    Hypertension Sister    Hypertension Brother    Colon cancer Neg Hx    Colon polyps Neg Hx    Esophageal cancer Neg Hx    Rectal cancer Neg Hx    Stomach cancer Neg Hx     Past Medical History:  Diagnosis Date   ADD (attention deficit disorder)    Allergy    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HCC)    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   lasted about 6 hours, Echo did not show anything   Bilateral swelling of feet    Cholelithiasis    Chronic depressive disorder    Circadian rhythm sleep disorder, shift work    DDD (degenerative disc disease), 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   Diabetes (HCC)    Diverticulosis    Minimal   Fatty liver    GERD (gastroesophageal reflux disease)    Heart  murmur    Hepatic steatosis    History of hiatal hernia 2017   Small   HLA B27 (HLA B27 positive)    Hx of pancreatitis    Hyperlipidemia    Hypertension    IBS (irritable bowel syndrome)    Ischemic colitis (HCC)    Lactose intolerance    Lumbar scoliosis 2010   Mild   Migraine headache without aura 05/10/2015   Nephrolithiasis 2014   Left, minimal   Obesity due to excess calories with serious comorbidity 05/24/2019   Palpitations    Prediabetes    Presbyopia    Right bundle branch block    Sepsis (HCC)  01/2016   Sleep apnea    Sleep hypopnea    Vitamin D deficiency     Past Surgical History:  Procedure Laterality Date   BLADDER SURGERY     BREAST BIOPSY Left    CERVICAL CONE BIOPSY     CHOLECYSTECTOMY N/A 10/26/2014   Procedure: LAPAROSCOPIC CHOLECYSTECTOMY;  Surgeon: Abigail Miyamoto, MD;  Location: [facility];  Service: General;  Laterality: N/A;   COLONOSCOPY W/ BIOPSIES  multiple   CYSTOSCOPY WITH RETROGRADE PYELOGRAM, URETEROSCOPY AND STENT PLACEMENT Left 06/26/2018   Procedure: CYSTOSCOPY WITH RETROGRADE PYELOGRAM, URETEROSCOPY AND STENT PLACEMENT;  Surgeon: Sebastian Ache, MD;  Location: [facility];  Service: Urology;  Laterality: Left;   DIAGNOSTIC LAPAROSCOPY     DILATION AND CURETTAGE OF UTERUS  11/2009   DUPUYTREN CONTRACTURE RELEASE Left    x 3 , Dr Amanda Pea   ESOPHAGOGASTRODUODENOSCOPY  multiple   EYE SURGERY     HOLMIUM LASER APPLICATION Left 06/26/2018   Procedure: HOLMIUM LASER APPLICATION;  Surgeon: Sebastian Ache, MD;  Location: [facility];  Service: Urology;  Laterality: Left;   KNEE ARTHROSCOPY Left 11/2008   LAPAROSCOPIC VAGINAL HYSTERECTOMY WITH SALPINGO OOPHORECTOMY Bilateral 10/03/2015   Procedure: LAPAROSCOPIC ASSISTED VAGINAL HYSTERECTOMY WITH SALPINGECTOMY;  Surgeon: Hal Morales, MD;  Location: [facility];  Service: Gynecology;  Laterality: Bilateral;   LASIK  1990   RK   LEFT FINGER  SURGERY     LIVER BIOPSY     MASS EXCISION Left 12/24/2012   Procedure: LEFT EXCISION OF PALMAR MASS X TWO;  Surgeon: Dominica Severin, MD;  Location: [facility];  Service: Orthopedics;  Laterality: Left;   PHOTOREFRACTIVE KERATOTOMY  1994   RADIAL KERATOTOMY  1994     Current Outpatient Medications on File Prior to Visit  Medication Sig Dispense Refill   amphetamine-dextroamphetamine (ADDERALL) 10 MG tablet Take 1 tablet (10 mg total) by mouth daily as needed. 30 tablet 0   aspirin 325 MG tablet Take 325 mg by mouth daily.     cholecalciferol (VITAMIN D) 1000 UNITS tablet Take 2,000 Units by mouth daily.      cycloSPORINE (RESTASIS) 0.05 % ophthalmic emulsion Place 1 drop into both eyes 2 times daily. 180 each 3   fluticasone (FLONASE) 50 MCG/ACT nasal spray Place 1 spray into both nostrils in the morning and at bedtime. 16 g 6   furosemide (LASIX) 40 MG tablet Take 1 tablet (40 mg total) by mouth daily. 90 tablet 1   imiquimod (ALDARA) 5 % cream Apply to affected area once daily Monday through Friday X6 weeks 24 each 0   losartan (COZAAR) 100 MG tablet Take 1 tablet (100 mg total) by mouth daily. 90 tablet 3   Lutein 6 MG CAPS Take 6 mg by mouth daily.     metoprolol succinate (TOPROL-XL) 50 MG 24 hr tablet Take 1 tablet (50 mg total) by mouth daily with or immediately following a meal. 270 tablet 3   metoprolol succinate (TOPROL-XL) 50 MG 24 hr tablet Take 1.5 tablets (75 mg total) by mouth in the morning AND 1 tablet (50 mg total) at bedtime. 90 tablet 3   Omega-3 1000 MG CAPS Take 1 capsule by mouth daily.     potassium chloride SA (KLOR-CON M) 20 MEQ tablet Take 1 tablet (20 mEq total) by mouth daily. 90 tablet 1   rosuvastatin (CRESTOR) 20 MG tablet Take 1 tablet (20 mg total) by mouth daily. 90 tablet 3  tirzepatide [facility]) 5 MG/0.5ML Pen Inject 5 mg into the skin once a week. 2 mL 0   Turmeric 450 MG CAPS Take 450 mg by mouth daily.      No current  facility-administered medications on file prior to visit.    Allergies  Allergen Reactions   Ace Inhibitors Other (See Comments)    coughing   Dust Mite Extract Other (See Comments)    Sneezing and congestion   Oxycodone Hcl Other (See Comments) and Nausea And Vomiting    NDC ZOXW:[id number]   Oxycodone-Acetaminophen Nausea And Vomiting   Tree Extract Other (See Comments)    Tree pollen and scrub pollen    Vicodin [Hydrocodone-Acetaminophen] Nausea And Vomiting   Victoza [Liraglutide] Other (See Comments)    pancreatitis   Adhesive [Tape] Rash    Not on stomach     DIAGNOSTIC DATA (LABS, IMAGING, TESTING) - I reviewed patient records, labs, notes, testing and imaging myself where available.  Lab Results  Component Value Date   WBC 6.2 08/09/2022   HGB 12.6 08/09/2022   HCT 38.4 08/09/2022   MCV 84.7 08/09/2022   PLT 213.0 08/09/2022      Component Value Date/Time   NA 141 08/09/2022 0936   NA 142 09/03/2021 1219   K 4.2 08/09/2022 0936   CL 105 08/09/2022 0936   CO2 28 08/09/2022 0936   GLUCOSE 131 (H) 08/09/2022 0936   BUN 24 (H) 08/09/2022 0936   BUN 21 09/03/2021 1219   CREATININE 0.86 08/09/2022 0936   CREATININE 0.76 05/05/2020 1631   CALCIUM 9.4 08/09/2022 0936   PROT 6.2 08/09/2022 0936   PROT 6.3 09/03/2021 1219   ALBUMIN 4.0 08/09/2022 0936   ALBUMIN 4.1 09/03/2021 1219   AST 13 08/09/2022 0936   ALT 17 08/09/2022 0936   ALKPHOS 75 08/09/2022 0936   BILITOT 0.4 08/09/2022 0936   BILITOT 0.4 09/03/2021 1219   GFRNONAA >60 07/09/2022 1940   GFRAA >60 04/20/2018 1323   Lab Results  Component Value Date   CHOL 144 05/27/2022   HDL 38.60 (L) 05/27/2022   LDLCALC 75 05/27/2022   LDLDIRECT 118.0 05/24/2019   TRIG 152.0 (H) 05/27/2022   CHOLHDL 4 05/27/2022   Lab Results  Component Value Date   HGBA1C 5.7 (A) 09/02/2022   Lab Results  Component Value Date   VITAMINB12 438 05/24/2021   Lab Results  Component Value Date   TSH 1.545  07/09/2022    PHYSICAL EXAM:  Today's Vitals   10/07/22 1328  BP: (!) 153/97  Pulse: 64  Weight: 259 lb 9.6 oz (117.8 kg)  Height: 5\' 6"  (1.676 m)   Body mass index is 41.9 kg/m.   Wt Readings from Last 3 Encounters:  10/07/22 259 lb 9.6 oz (117.8 kg)  09/03/22 257 lb (116.6 kg)  09/02/22 261 lb 9.6 oz (118.7 kg)     Ht Readings from Last 3 Encounters:  10/07/22 5\' 6"  (1.676 m)  09/03/22 5\' 6"  (1.676 m)  09/02/22 5\' 6"  (1.676 m)      General: The patient is awake, alert and appears not in acute distress. The patient is well groomed. Head: Normocephalic, atraumatic. Neck is supple. Mallampati 4  neck circumference: 15.25 . Nasal airflow restricted, TMJ is evident. Retrognathia is not seen.  Bruxism marks ! . Still wears mouth guard, which fits unde nasal pillows/ nasal  ask.  Cardiovascular:  Regular rate and rhythm, 53 bpm.  Respiratory: Lungs are clear to auscultation. Skin:  Ankle  edema, moderate.Trunk: BMI is 38.9 kg/m2.  The patient's posture is erect-    Neurologic exam :The patient is awake and alert, oriented to place and time.  Mood and affect are appropriate, she is happy.  Cranial nerves:Pupils are equal and briskly reactive to light and accomodation.   Extraocular movements  in vertical and horizontal planes intact and without nystagmus. Visual fields by finger perimetry are intact.Hearing to finger rub intact. Facial sensation intact to fine touch. Facial motor strength is symmetric and tongue and uvula move midline. Tongue protrusion into cheeks is strong.  Shoulder shrug was symmetrical.  Motor exam:  Symmetric bulk, tone and ROM.   Normal tone without cog wheeling, symmetric grip strength .   Sensory:  intact Proprioception tested in the upper extremities was normal.   Coordination:without evidence of ataxia, dysmetria or tremor.   Gait and station: Patient could rise unassisted from a seated position, walked without assistive device.  Stance is of  normal width/ base  Deep tendon reflexes: in the  upper and lower extremities are symmetric and intact.  Babinski response was deferred .    ASSESSMENT AND PLAN 67 y.o. year old female  here with new CPAP machine auto set, RESMED  S 11. :    1) OSA on CPAP , sleep patient here since 2007.uses a nasal pillow. Did not tolerate FFM.  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 More soreness. We discussed seeing Dr Irene Limbo.   3) OSA risk factor is her weight- has not yet seen impact of Mounjaro on weight yet. 5 mg dose.   Was on Ozempic, had nausea.  6.2 HbA1c.    I plan to follow up personally within 12 months.   I would like to thank Willow Ora, MD  for allowing me to meet with and to take care of this pleasant patient.  I will share my notes with Dr Lawson Radar, MD   After spending a total time of  35  minutes face to face and additional time for physical and neurologic examination, review of laboratory studies,  personal review of imaging studies, reports and results of other testing and review of referral information / records as far as provided in visit,   Electronically signed by: Melvyn Novas, MD 10/07/2022 1:55 PM  [facility] Board certified by The [employer] of Sleep Medicine and Diplomate of the [employer] of Sleep Medicine. Board certified In Neurology through the ABPN, Fellow of the [employer] of Neurology. [facility] is an [facility].

## 2022-10-07 NOTE — Patient Instructions (Signed)

## 2022-10-08 ENCOUNTER — Encounter (INDEPENDENT_AMBULATORY_CARE_PROVIDER_SITE_OTHER): Payer: Self-pay | Admitting: Family Medicine

## 2022-10-08 ENCOUNTER — Other Ambulatory Visit (HOSPITAL_BASED_OUTPATIENT_CLINIC_OR_DEPARTMENT_OTHER): Payer: Self-pay

## 2022-10-08 ENCOUNTER — Ambulatory Visit (INDEPENDENT_AMBULATORY_CARE_PROVIDER_SITE_OTHER): Payer: 59 | Admitting: Family Medicine

## 2022-10-08 VITALS — BP 128/44 | HR 65 | Temp 98.4°F | Ht 66.0 in | Wt 254.0 lb

## 2022-10-08 DIAGNOSIS — Z7985 Long-term (current) use of injectable non-insulin antidiabetic drugs: Secondary | ICD-10-CM

## 2022-10-08 DIAGNOSIS — E1159 Type 2 diabetes mellitus with other circulatory complications: Secondary | ICD-10-CM | POA: Diagnosis not present

## 2022-10-08 DIAGNOSIS — E1165 Type 2 diabetes mellitus with hyperglycemia: Secondary | ICD-10-CM

## 2022-10-08 DIAGNOSIS — Z6841 Body Mass Index (BMI) 40.0 and over, adult: Secondary | ICD-10-CM | POA: Diagnosis not present

## 2022-10-08 DIAGNOSIS — E669 Obesity, unspecified: Secondary | ICD-10-CM | POA: Diagnosis not present

## 2022-10-08 DIAGNOSIS — I152 Hypertension secondary to endocrine disorders: Secondary | ICD-10-CM | POA: Diagnosis not present

## 2022-10-08 MED ORDER — TIRZEPATIDE 5 MG/0.5ML ~~LOC~~ SOAJ
5.0000 mg | SUBCUTANEOUS | 0 refills | Status: DC
  Filled 2022-10-08: qty 2, 28d supply, fill #0
  Filled 2022-11-04: qty 2, 28d supply, fill #1
  Filled 2022-12-08: qty 2, 28d supply, fill #2

## 2022-10-08 NOTE — Progress Notes (Signed)
Chief Complaint:   OBESITY Alexandra Walker is here to discuss her progress with her obesity treatment plan along with follow-up of her obesity related diagnoses. Alexandra Walker is on keeping a food journal and adhering to recommended goals of 1500-1600 calories and 95+ grams of protein and states she is following her eating plan approximately 50% of the time. Alexandra Walker states she is walking and doing yard work for 3-4 hours 2 times per week.    Today's visit was #: 16 Starting weight: 265 lbs Starting date: 05/24/2021 Today's weight: 254 lbs Today's date: 10/08/2022 Total lbs lost to date: 11 Total lbs lost since last in-office visit: 3  Interim History: Patient slept today and just woke up- she is working Quarry manager.  She has been able to follow her plan 50% of the time.  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  She hasn't felt like chewing due to pain.  She was traveling last week to [facility] to see her daughter.  She hasn't gotten around to the fuel meals. She is working this weekend for [employer] Day weekend.  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   Subjective:   1. Type 2 diabetes mellitus with hyperglycemia, without long-term current use of insulin (HCC) Aniyjah is on Mounjaro at 5 mg weekly. Her last A1c was 5.7.  2. Hypertension associated with diabetes (HCC) Akili's blood pressure is controlled today. She denies chest pain, chest pressure, or headache. She is on Lasix, Cozaar, and Toprol.   Assessment/Plan:   1. Type 2 diabetes mellitus with hyperglycemia, without long-term current use of insulin (HCC) We will refill Mounjaro at 5 mg for 90 days.   - tirzepatide [facility]) 5 MG/0.5ML Pen; Inject 5 mg into the skin once a week.  Dispense: 6 mL; Refill: 0  2. Hypertension associated with diabetes [facility]) Candee will continue her current medications with no change in dose.   3. BMI 40.0-44.9, adult (HCC)  4. Obesity with starting BMI of  42.7 [name] is currently in the action stage of change. As such, her goal is to continue with weight loss efforts. She has agreed to keeping a food journal and adhering to recommended goals of 1500-1600 calories and 95+ grams of protein daily.   Exercise goals: All adults should avoid inactivity. Some physical activity is better than none, and adults who participate in any amount of physical activity gain some health benefits.  Behavioral modification strategies: increasing lean protein intake, meal planning and cooking strategies, keeping healthy foods in the home, and planning for success.  Alexandra Walker has agreed to follow-up with our clinic in 12 weeks. She was informed of the importance of frequent follow-up visits to maximize her success with intensive lifestyle modifications for her multiple health conditions.   Objective:   Blood pressure (!) 128/44, pulse 65, temperature 98.4 F (36.9 C), height 5\' 6"  (1.676 m), weight 254 lb (115.2 kg), last menstrual period 05/20/2008, SpO2 97 %. Body mass index is 41 kg/m.  General: Cooperative, alert, well developed, in no acute distress. HEENT: Conjunctivae and lids unremarkable. Cardiovascular: Regular rhythm.  Lungs: Normal work of breathing. Neurologic: No focal deficits.   Lab Results  Component Value Date   CREATININE 0.86 08/09/2022   BUN 24 (H) 08/09/2022   NA 141 08/09/2022   K 4.2 08/09/2022   CL 105 08/09/2022   CO2 28 08/09/2022   Lab Results  Component Value Date  ALT 17 08/09/2022   AST 13 08/09/2022   ALKPHOS 75 08/09/2022   BILITOT 0.4 08/09/2022   Lab Results  Component Value Date   HGBA1C 5.7 (A) 09/02/2022   HGBA1C 5.7 (A) 05/27/2022   HGBA1C 6.2 (H) 09/03/2021   HGBA1C 6.3 (A) 05/07/2021   HGBA1C 6.8 (H) 11/13/2020   Lab Results  Component Value Date   INSULIN 14.6 09/03/2021   INSULIN 12.5 05/24/2021   INSULIN 12.5 03/13/2017   INSULIN 10.0 10/28/2016   Lab Results  Component Value Date   TSH 1.545  07/09/2022   Lab Results  Component Value Date   CHOL 144 05/27/2022   HDL 38.60 (L) 05/27/2022   LDLCALC 75 05/27/2022   LDLDIRECT 118.0 05/24/2019   TRIG 152.0 (H) 05/27/2022   CHOLHDL 4 05/27/2022   Lab Results  Component Value Date   VD25OH 46.6 09/03/2021   VD25OH 50.7 05/24/2021   VD25OH 58.00 11/13/2020   Lab Results  Component Value Date   WBC 6.2 08/09/2022   HGB 12.6 08/09/2022   HCT 38.4 08/09/2022   MCV 84.7 08/09/2022   PLT 213.0 08/09/2022   No results found for: "IRON", "TIBC", "FERRITIN"  Attestation Statements:   Reviewed by clinician on day of visit: allergies, medications, problem list, medical history, surgical history, family history, social history, and previous encounter notes.   I, Burt Knack, am acting as transcriptionist for Reuben Likes, MD.  I have reviewed the above documentation for accuracy and completeness, and I agree with the above. - Reuben Likes, MD

## 2022-10-16 ENCOUNTER — Encounter: Payer: Self-pay | Admitting: Cardiovascular Disease

## 2022-10-16 ENCOUNTER — Ambulatory Visit: Payer: 59 | Attending: Cardiovascular Disease | Admitting: Cardiovascular Disease

## 2022-10-16 DIAGNOSIS — I10 Essential (primary) hypertension: Secondary | ICD-10-CM

## 2022-10-16 DIAGNOSIS — R079 Chest pain, unspecified: Secondary | ICD-10-CM

## 2022-10-16 DIAGNOSIS — I471 Supraventricular tachycardia, unspecified: Secondary | ICD-10-CM

## 2022-10-16 DIAGNOSIS — I5032 Chronic diastolic (congestive) heart failure: Secondary | ICD-10-CM

## 2022-10-16 NOTE — Progress Notes (Signed)
Chief Complaint  Patient presents with   Follow-up    PVCs   History of Present Illness: 67 yo female with history of HTN, HLD, chronic diastolic CHF, pre-diabetes, SVT, PACs and irritable bowel syndrome who is here today for cardiac follow up. 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 She was seen as a new patient in our office in 2013. She had been followed previously in [facility] with Dr. Luberta Robertson. She has had several stress tests that were normal with negative stress echo in 2003, 2005 and a normal stress myoview in 2009. She has had chronic chest pain as well as irritable bowel syndrome. She has history of PACs that are controlled with Toprol. She also has esophagitis and had an esophageal dilatation 2014. She was noted to have atrial fib/SVT when admitted with urosepsis in 2017. Cardiac event monitor in 2017 with no evidence of atrial fibrillation or SVT. Coronary CTA in 2017 showed no evidence of CAD with calcium score of zero. Echo May 2017 with normal LV size and function. No valve disease. She was seen in our office in February 2024 with c/o palpitations. She was seen in the ED prior to that visit with chest pain and palpitations and had atrial bigeminy, no ischemic changes, negative troponin and normal TSH. Echo March 2024 with LVEF=60-65%, mild LVH. No significant valve disease.   She is here today for follow up. The patient denies any chest pain, dyspnea, palpitations, lower extremity edema, orthopnea, PND, dizziness, near syncope or syncope.  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   Diabetes (HCC)    Diverticulosis    Minimal   Fatty liver    GERD (gastroesophageal reflux disease)    Heart murmur    Hepatic steatosis    History of hiatal hernia 2017   Small   HLA B27 (HLA B27 positive)    Hx of pancreatitis    Hyperlipidemia    Hypertension    IBS (irritable bowel syndrome)    Ischemic colitis (HCC)    Lactose intolerance    Lumbar scoliosis 2010   Mild   Migraine headache without aura 05/10/2015   Nephrolithiasis 2014   Left, minimal   Obesity due to excess calories with serious comorbidity 05/24/2019   Palpitations    Prediabetes    Presbyopia    Right bundle branch block    Sepsis (HCC) 01/2016   Sleep apnea    Sleep hypopnea    Vitamin D deficiency     Past Surgical History:  Procedure Laterality Date   BLADDER SURGERY     BREAST BIOPSY Left    CERVICAL CONE BIOPSY     CHOLECYSTECTOMY N/A 10/26/2014   Procedure: LAPAROSCOPIC CHOLECYSTECTOMY;  Surgeon: Abigail Miyamoto, MD;  Location: [facility];  Service: General;  Laterality: N/A;   COLONOSCOPY W/ BIOPSIES  multiple   CYSTOSCOPY WITH RETROGRADE PYELOGRAM, URETEROSCOPY AND STENT PLACEMENT Left 06/26/2018   Procedure: CYSTOSCOPY WITH RETROGRADE PYELOGRAM, URETEROSCOPY AND STENT PLACEMENT;  Surgeon: Sebastian Ache, MD;  Location: [facility];  Service: Urology;  Laterality: Left;   DIAGNOSTIC LAPAROSCOPY     DILATION AND CURETTAGE OF UTERUS  11/2009   DUPUYTREN CONTRACTURE RELEASE Left    x 3 , Dr Amanda Pea   ESOPHAGOGASTRODUODENOSCOPY  multiple   EYE SURGERY     HOLMIUM LASER APPLICATION Left 06/26/2018   Procedure: HOLMIUM LASER APPLICATION;  Surgeon: Sebastian Ache, MD;  Location[facility];  Service: Urology;  Laterality: Left;   KNEE ARTHROSCOPY Left 11/2008   LAPAROSCOPIC VAGINAL HYSTERECTOMY WITH SALPINGO OOPHORECTOMY Bilateral 10/03/2015   Procedure: LAPAROSCOPIC ASSISTED VAGINAL  HYSTERECTOMY WITH SALPINGECTOMY;  Surgeon: Hal Morales, MD;  Location: [facility];  Service: Gynecology;  Laterality: Bilateral;   LASIK  1990   RK   LEFT FINGER SURGERY     LIVER BIOPSY     MASS EXCISION Left 12/24/2012   Procedure: LEFT EXCISION OF PALMAR MASS X TWO;  Surgeon: Dominica Severin, MD;  Location: [facility];  Service: Orthopedics;  Laterality: Left;   PHOTOREFRACTIVE KERATOTOMY  1994   RADIAL KERATOTOMY  1994    Current Outpatient Medications  Medication Sig Dispense Refill   amphetamine-dextroamphetamine (ADDERALL) 10 MG tablet Take 1 tablet (10 mg total) by mouth daily as needed. 30 tablet 0   aspirin 325 MG tablet Take 325 mg by mouth daily.     cholecalciferol (VITAMIN D) 1000 UNITS tablet Take 2,000 Units by mouth daily.      cycloSPORINE (RESTASIS) 0.05 % ophthalmic emulsion Place 1 drop into both eyes 2 times daily. 180 each 3   fluticasone (FLONASE) 50 MCG/ACT nasal spray Place 1 spray into both nostrils in the morning and at bedtime. 16 g 6   furosemide (LASIX) 40 MG tablet Take 1 tablet (40 mg total) by mouth daily. 90 tablet 1   imiquimod (ALDARA) 5 % cream Apply to affected area once daily Monday through Friday X6 weeks 24 each 0   losartan (COZAAR) 100 MG tablet Take 1 tablet (100 mg total) by mouth daily. 90 tablet 3   Lutein 6 MG CAPS Take 6 mg by mouth daily.     metoprolol succinate (TOPROL-XL) 50 MG 24 hr tablet Take 1 tablet (50 mg total) by mouth daily with or immediately following a meal. 270 tablet 3   metoprolol succinate (TOPROL-XL) 50 MG 24 hr tablet Take 1.5 tablets (75 mg total) by mouth in the morning AND 1 tablet (50 mg total) at bedtime. 90 tablet 3   Omega-3 1000 MG CAPS Take 1 capsule by mouth daily.     potassium chloride SA (KLOR-CON M) 20 MEQ tablet Take 1 tablet (20 mEq total) by mouth daily. 90 tablet 1   rosuvastatin (CRESTOR) 20 MG tablet Take 1 tablet (20 mg total) by mouth daily. 90 tablet 3   tirzepatide (MOUNJARO) 5  MG/0.5ML Pen Inject 5 mg into the skin once a week. 6 mL 0   Turmeric 450 MG CAPS Take 450 mg by mouth daily.      No current facility-administered medications for this visit.    Allergies  Allergen Reactions   Ace Inhibitors Other (See Comments)    coughing   Dust Mite Extract Other (See Comments)    Sneezing and congestion   Oxycodone Hcl Other (See Comments) and Nausea And Vomiting    NDC ZOXW:[id number]   Oxycodone-Acetaminophen Nausea And Vomiting   Tree Extract Other (See Comments)    Tree pollen and scrub  pollen    Vicodin [Hydrocodone-Acetaminophen] Nausea And Vomiting   Victoza [Liraglutide] Other (See Comments)    pancreatitis   Adhesive [Tape] Rash    Not on stomach    Social History   Socioeconomic History   Marital status: Divorced    Spouse name: Not on file   Number of children: 1   Years of education: Not on file   Highest education level: Master's degree (e.g., MA, MS, MEng, MEd, MSW, MBA)  Occupational History   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   Social Determinants of Health   Financial Resource Strain: Low Risk  (09/02/2022)   Overall Financial Resource Strain (CARDIA)    Difficulty of Paying Living Expenses: Not hard at all  Food Insecurity: No Food Insecurity (09/02/2022)   Hunger Vital Sign    Worried About Running Out of Food in the Last Year: Never true    Ran Out of Food in the Last Year: Never true  Transportation Needs: No Transportation Needs (09/02/2022)   PRAPARE - Scientist, research (physical sciences) (Medical): No    Lack of Transportation (Non-Medical): No  Physical Activity: Insufficiently Active (09/02/2022)   Exercise Vital Sign    Days of Exercise per Week: 1 day    Minutes of Exercise per Session: 40 min  Stress: No Stress Concern Present (09/02/2022)   [employer] of Occupational Health - Occupational Stress Questionnaire    Feeling of Stress : Only a little  Social Connections: Moderately Integrated (09/02/2022)   Social Connection and Isolation Panel [NHANES]    Frequency of Communication with Friends and Family: Three times a week    Frequency of Social Gatherings with Friends and Family: Once a week    Attends Religious Services: More than 4 times per year    Active Member of [employer] or Organizations: Yes    Attends Engineer, structural: More than 4 times per year    Marital Status: Divorced  Catering manager Violence: Not on file    Family History  Problem Relation Age of Onset   Coronary artery disease Mother    Hypertension Mother    Heart disease Mother    Heart attack Mother    Diabetes Mother    Hyperlipidemia Mother    Kidney disease Mother    Depression Mother    Sleep apnea Mother    Obesity Mother    Heart attack Father    Heart disease Father    Sudden death Father    Hypertension Sister    Hypertension Brother    Colon cancer Neg Hx    Colon polyps Neg Hx    Esophageal cancer Neg Hx    Rectal cancer Neg Hx    Stomach cancer Neg Hx     Review of Systems:  As stated in the HPI and otherwise negative.   BP (!) 142/98   Pulse 62   Ht 5\' 6"  (1.676 m)   Wt 120.8 kg  LMP 05/20/2008   SpO2 98%   BMI 43.00 kg/m   Physical Examination: General: Well developed, well nourished, NAD  HEENT: OP clear, mucus membranes moist  SKIN: warm, dry. No rashes. Neuro: No focal deficits  Musculoskeletal: Muscle strength 5/5 all ext  Psychiatric: Mood and affect normal  Neck: No JVD, no carotid bruits, no thyromegaly, no  lymphadenopathy.  Lungs:Clear bilaterally, no wheezes, rhonci, crackles Cardiovascular: Regular rate and rhythm. No murmurs, gallops or rubs. Abdomen:Soft. Bowel sounds present. Non-tender.  Extremities: No lower extremity edema. Pulses are 2 + in the bilateral DP/PT.  Echo March 2024:  1. Left ventricular ejection fraction, by estimation, is 60 to 65%. The  left ventricle has normal function. The left ventricle has no regional  wall motion abnormalities. There is mild concentric left ventricular  hypertrophy. Left ventricular diastolic  parameters were normal.   2. Right ventricular systolic function is normal. The right ventricular  size is normal.   3. Left atrial size was moderately dilated.   4. The mitral valve is normal in structure. Trivial mitral valve  regurgitation. No evidence of mitral stenosis.   5. The aortic valve is normal in structure. Aortic valve regurgitation is  not visualized. No aortic stenosis is present.   6. The inferior vena cava is normal in size with greater than 50%  respiratory variability, suggesting right atrial pressure of 3 mmHg.   EKG:  EKG is not ordered today. The ekg ordered today demonstrates Sinus, 1st degree AV block. LBBB. Chronic non-specific T wave abn, poor R wave progression  Recent Labs: 07/09/2022: B Natriuretic Peptide 89.9; Magnesium 2.1; TSH 1.545 08/09/2022: ALT 17; BUN 24; Creatinine, Ser 0.86; Hemoglobin 12.6; Platelets 213.0; Potassium 4.2; Pro B Natriuretic peptide (BNP) 64.0; Sodium 141   Lipid Panel    Component Value Date/Time   CHOL 144 05/27/2022 1045   CHOL 134 09/03/2021 1219   TRIG 152.0 (H) 05/27/2022 1045   HDL 38.60 (L) 05/27/2022 1045   HDL 39 (L) 09/03/2021 1219   CHOLHDL 4 05/27/2022 1045   VLDL 30.4 05/27/2022 1045   LDLCALC 75 05/27/2022 1045   LDLCALC 70 09/03/2021 1219   LDLDIRECT 118.0 05/24/2019 1615     Wt Readings from Last 3 Encounters:  10/16/22 120.8 kg  10/08/22 115.2 kg  10/07/22 117.8 kg     Assessment and Plan:   1. Chest pain: No chest pain suggestive of angina. Her chest pain in February 2024 was likely related to early beats. Coronary CTA in 2017 with no evidence of CAD. LV systolic function normal by echo in 2024.   2. SVT/PACs: Rare palpitations. Continue Toprol.   3. HTN: BP is well controlled at home. No changes today  4. Chronic diastolic CHF: No volume overload on exam. Continue Lasix 40 mg daily.   Labs/ tests ordered today include:   Orders Placed This Encounter  Procedures   EKG 12-Lead   Disposition:   F/U with me in 12 months  Signed, Verne Carrow, MD 10/16/2022 4:40 PM    [facility] [address] [place], [place], [place]  [address] Phone: [phone]; Fax: [phone]

## 2022-10-16 NOTE — Patient Instructions (Signed)
Medication Instructions:  Your physician recommends that you continue on your current medications as directed. Please refer to the Current Medication list given to you today.  *If you need a refill on your cardiac medications before your next appointment, please call your pharmacy*   Lab Work: NONE  If you have labs (blood work) drawn today and your tests are completely normal, you will receive your results only by: MyChart Message (if you have MyChart) OR A paper copy in the mail If you have any lab test that is abnormal or we need to change your treatment, we will call you to review the results.   Testing/Procedures: NONE   Follow-Up: At [facility], you and your health needs are our priority.  As part of our continuing mission to provide you with exceptional heart care, we have created designated Provider Care Teams.  These Care Teams include your primary Cardiologist (physician) and Advanced Practice Providers (APPs -  Physician Assistants and Nurse Practitioners) who all work together to provide you with the care you need, when you need it.   Your next appointment:   1 year(s)  Provider:   Christopher McAlhany, MD      

## 2022-10-18 ENCOUNTER — Other Ambulatory Visit (HOSPITAL_BASED_OUTPATIENT_CLINIC_OR_DEPARTMENT_OTHER): Payer: Self-pay

## 2022-10-18 DIAGNOSIS — D0462 Carcinoma in situ of skin of left upper limb, including shoulder: Secondary | ICD-10-CM | POA: Diagnosis not present

## 2022-10-18 DIAGNOSIS — L821 Other seborrheic keratosis: Secondary | ICD-10-CM | POA: Diagnosis not present

## 2022-10-18 DIAGNOSIS — L814 Other melanin hyperpigmentation: Secondary | ICD-10-CM | POA: Diagnosis not present

## 2022-10-18 DIAGNOSIS — L2089 Other atopic dermatitis: Secondary | ICD-10-CM | POA: Diagnosis not present

## 2022-10-18 MED ORDER — PIMECROLIMUS 1 % EX CREA
TOPICAL_CREAM | CUTANEOUS | 0 refills | Status: DC
  Filled 2022-10-18: qty 30, 30d supply, fill #0

## 2022-10-21 ENCOUNTER — Other Ambulatory Visit (HOSPITAL_BASED_OUTPATIENT_CLINIC_OR_DEPARTMENT_OTHER): Payer: Self-pay

## 2022-11-04 ENCOUNTER — Other Ambulatory Visit (HOSPITAL_BASED_OUTPATIENT_CLINIC_OR_DEPARTMENT_OTHER): Payer: Self-pay

## 2022-11-05 ENCOUNTER — Ambulatory Visit (INDEPENDENT_AMBULATORY_CARE_PROVIDER_SITE_OTHER): Payer: 59 | Admitting: Internal Medicine

## 2022-11-05 ENCOUNTER — Encounter: Payer: Self-pay | Admitting: Internal Medicine

## 2022-11-05 ENCOUNTER — Other Ambulatory Visit (HOSPITAL_BASED_OUTPATIENT_CLINIC_OR_DEPARTMENT_OTHER): Payer: Self-pay

## 2022-11-05 VITALS — BP 134/86 | HR 64 | Ht 65.75 in | Wt 262.4 lb

## 2022-11-05 DIAGNOSIS — K760 Fatty (change of) liver, not elsewhere classified: Secondary | ICD-10-CM

## 2022-11-05 DIAGNOSIS — K580 Irritable bowel syndrome with diarrhea: Secondary | ICD-10-CM | POA: Diagnosis not present

## 2022-11-05 DIAGNOSIS — K5730 Diverticulosis of large intestine without perforation or abscess without bleeding: Secondary | ICD-10-CM | POA: Diagnosis not present

## 2022-11-05 DIAGNOSIS — R1310 Dysphagia, unspecified: Secondary | ICD-10-CM

## 2022-11-05 DIAGNOSIS — E739 Lactose intolerance, unspecified: Secondary | ICD-10-CM | POA: Diagnosis not present

## 2022-11-05 NOTE — Progress Notes (Signed)
Alexandra Walker 67 y.o. [birth year] [id number]  Assessment & Plan:   Encounter Diagnoses  Name Primary?   Diverticulosis of colon without hemorrhage Yes   Irritable bowel syndrome with diarrhea    Dysphagia, unspecified type - improved    Lactose intolerance    MAFLD - NAFLD    We reviewed tortuous colon, IBS issues.  She is improved by limiting milk products and will continue.  We had a discussion about probiotics, I do not recommend supplements anymore but I think probiotic foods and trying to eat 30 Plants in a week are reasonable approach is to try to help improve the gut microbiome.  Continue attempts at weight loss this would help MAFLD.  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  She wanted to review the tortuous colon and diverticulosis issues.  She does have a history of IBS which is improved these days she is eating differently she is on Mounjaro and it is helping her diabetes though she has not lost weight, she says the weight clinic suggest she needs to eat more protein to lose weight.  Her dysphagia has resolved after empiric dilation.  Bowel habits are generally regular every day to every other day without significant diarrhea at this time and no constipation.  She did have an episode where she ate some ice cream the other day and had terrible diarrhea and slight rectal bleeding.  She is doing well on Mounjaro without side effects it seems.  She does recall how she had pancreatitis on Trulicity a number of years ago.  Wt Readings from Last 3 Encounters:  11/05/22 262 lb 6 oz (119 kg)  10/16/22 266 lb 6.4 oz (120.8 kg)  10/08/22 254 lb (115.2 kg)     Impression:               - Diverticulosis in the  sigmoid colon. There was                            narrowing of the colon in association with the                            diverticular opening.                           - The examination was otherwise normal on direct                            and retroflexion views. Impression:               - No endoscopic esophageal abnormality to explain                            patient's dysphagia. Esophagus dilated. Dilated.                           - The examination was otherwise normal. Allergies  Allergen Reactions   Ace Inhibitors Other (See Comments)  coughing   Dust Mite Extract Other (See Comments)    Sneezing and congestion   Oxycodone Hcl Other (See Comments) and Nausea And Vomiting    NDC ONGE:[id number]   Oxycodone-Acetaminophen Nausea And Vomiting   Tree Extract Other (See Comments)    Tree pollen and scrub pollen    Vicodin [Hydrocodone-Acetaminophen] Nausea And Vomiting   Victoza [Liraglutide] Other (See Comments)    pancreatitis   Adhesive [Tape] Rash    Not on stomach   Current Meds  Medication Sig   amphetamine-dextroamphetamine (ADDERALL) 10 MG tablet Take 1 tablet (10 mg total) by mouth daily as needed.   aspirin 325 MG tablet Take 325 mg by mouth daily.   cholecalciferol (VITAMIN D) 1000 UNITS tablet Take 2,000 Units by mouth daily.    cycloSPORINE (RESTASIS) 0.05 % ophthalmic emulsion Place 1 drop into both eyes 2 times daily.   fluticasone (FLONASE) 50 MCG/ACT nasal spray Place 1 spray into both nostrils in the morning and at bedtime.   furosemide (LASIX) 40 MG tablet Take 1 tablet (40 mg total) by mouth daily.   losartan (COZAAR) 100 MG tablet Take 1 tablet (100 mg total) by mouth daily.   Lutein 6 MG CAPS Take 6 mg by mouth daily.   metoprolol succinate (TOPROL-XL) 50 MG 24 hr tablet Take 1 tablet (50 mg total) by mouth daily with or immediately following a meal.   metoprolol succinate (TOPROL-XL) 50 MG 24 hr tablet Take 1.5 tablets (75 mg total) by mouth  in the morning AND 1 tablet (50 mg total) at bedtime.   Omega-3 1000 MG CAPS Take 1 capsule by mouth daily.   pimecrolimus (ELIDEL) 1 % cream Apply twice a day to eye brow area as needed for irritation   potassium chloride SA (KLOR-CON M) 20 MEQ tablet Take 1 tablet (20 mEq total) by mouth daily.   rosuvastatin (CRESTOR) 20 MG tablet Take 1 tablet (20 mg total) by mouth daily.   tirzepatide [facility]) 5 MG/0.5ML Pen Inject 5 mg into the skin once a week.   Turmeric 450 MG CAPS Take 450 mg by mouth daily.    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   Diabetes (HCC)    Diverticulosis    Minimal   Fatty liver    GERD (gastroesophageal reflux disease)    Heart murmur    Hepatic steatosis    History of hiatal hernia 2017   Small   HLA B27 (HLA B27 positive)    Hx of pancreatitis    Hyperlipidemia    Hypertension    IBS (irritable bowel syndrome)    Ischemic colitis (HCC)    Lactose intolerance    Lumbar scoliosis 2010   Mild   Migraine headache without aura 05/10/2015   Nephrolithiasis 2014   Left, minimal   Obesity due to excess calories with serious comorbidity 05/24/2019   Palpitations    Prediabetes    Presbyopia    Right bundle branch block    Sepsis (HCC) 01/2016   Sleep apnea    Sleep  hypopnea    Vitamin D deficiency    Past Surgical History:  Procedure Laterality Date   BLADDER SURGERY     BREAST BIOPSY Left    CERVICAL CONE BIOPSY     CHOLECYSTECTOMY N/A 10/26/2014   Procedure: LAPAROSCOPIC CHOLECYSTECTOMY;  Surgeon: Abigail Miyamoto, MD;  Location: [facility];  Service: General;  Laterality: N/A;   COLONOSCOPY W/ BIOPSIES  multiple   CYSTOSCOPY WITH RETROGRADE PYELOGRAM, URETEROSCOPY AND STENT PLACEMENT Left 06/26/2018   Procedure: CYSTOSCOPY WITH RETROGRADE PYELOGRAM, URETEROSCOPY AND STENT PLACEMENT;  Surgeon: Sebastian Ache, MD;  [facility]  Service: Urology;  Laterality: Left;   DIAGNOSTIC LAPAROSCOPY     DILATION AND CURETTAGE OF UTERUS  11/2009   DUPUYTREN CONTRACTURE RELEASE Left    x 3 , Dr Amanda Pea   ESOPHAGOGASTRODUODENOSCOPY  multiple   EYE SURGERY     HOLMIUM LASER APPLICATION Left 06/26/2018   Procedure: HOLMIUM LASER APPLICATION;  Surgeon: Sebastian Ache, MD;  Location: [facility];  Service: Urology;  Laterality: Left;   KNEE ARTHROSCOPY Left 11/2008   LAPAROSCOPIC VAGINAL HYSTERECTOMY WITH SALPINGO OOPHORECTOMY Bilateral 10/03/2015   Procedure: LAPAROSCOPIC ASSISTED VAGINAL HYSTERECTOMY WITH SALPINGECTOMY;  Surgeon: Hal Morales, MD;  Location: [facility];  Service: Gynecology;  Laterality: Bilateral;   LASIK  1990   RK   LEFT FINGER SURGERY     LIVER BIOPSY     MASS EXCISION Left 12/24/2012   Procedure: LEFT EXCISION OF PALMAR MASS X TWO;  Surgeon: Dominica Severin, MD;  Location: [facility];  Service: Orthopedics;  Laterality: Left;   PHOTOREFRACTIVE KERATOTOMY  1994   RADIAL KERATOTOMY  1994   Social History   Social History Narrative   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   family history includes Coronary artery disease in her mother; Depression in her mother; Diabetes in her mother; Heart attack in her father and mother; Heart disease in her father and mother; Hyperlipidemia in her mother; Hypertension in her brother, mother, and sister; Kidney disease in her mother; Obesity in her mother; Sleep apnea in her mother; Sudden death in her father.   Review of Systems  As above Objective:   Physical Exam BP 134/86 (BP Location: Left Arm,  Patient Position: Sitting, Cuff Size: Large)   Pulse 64   Ht 5' 5.75" (1.67 m)   Wt 262 lb 6 oz (119 kg)   LMP 05/20/2008   BMI 42.67 kg/m    24 minutes total time on this visit

## 2022-11-05 NOTE — Patient Instructions (Addendum)
Probiotics are important for health, they are best gotten through foods.  Pills are not proven to be effective in some research is suggesting they may actually cause problems.  Here are some examples of probiotic foods (also known as fermented foods):  Yogurt-high protein whole fat yogurt is best I believe, [place] yogurt is an example.  Add your own fruit because sugar is almost always added to yogurt with fruit in it found at the store.  Kefir-kind of like liquid yogurt  Miso-an Asian fermented bean paste  Sauerkraut  Kimchi-Asian fermented cabbage  Apple cider vinegar-use the liquid not Gummies or pills.  Some people can tolerate a spoonful of this alone but many fine adding it to water reduces the acidity.  You can work up to higher doses.  If taking it   undiluted rinse your mouth or drink water afterwards to reduce risk of dental damage.  A tablespoon a day is a good dose.  You could take more also.  Some people also find this helpful with reflux.   Plants also help (we think) Try to get 30 different plants in your meals each week.  This is easier than it sounds since it includes vegetables, fruits, seeds and nuts as well as spices.  Also try to have a variety of colors in your plants for the week. The purpose is to feed and support a diverse gut microbiome-the bacteria that live inside of you.  We are providing you with a lactose intolerance handout to read.  I appreciate the opportunity to care for you. Stan Head, MD, [facility]

## 2022-12-02 ENCOUNTER — Encounter (HOSPITAL_BASED_OUTPATIENT_CLINIC_OR_DEPARTMENT_OTHER): Payer: 59 | Admitting: Obstetrics & Gynecology

## 2022-12-09 ENCOUNTER — Other Ambulatory Visit (HOSPITAL_BASED_OUTPATIENT_CLINIC_OR_DEPARTMENT_OTHER): Payer: Self-pay

## 2022-12-09 ENCOUNTER — Other Ambulatory Visit: Payer: Self-pay

## 2022-12-26 ENCOUNTER — Encounter (HOSPITAL_BASED_OUTPATIENT_CLINIC_OR_DEPARTMENT_OTHER): Payer: Self-pay

## 2022-12-26 NOTE — Progress Notes (Unsigned)
Called and spoke with patient. Verified allergies, med list and medical history.Patient will be seen as a new patient in our office. tbw

## 2022-12-31 ENCOUNTER — Other Ambulatory Visit (HOSPITAL_BASED_OUTPATIENT_CLINIC_OR_DEPARTMENT_OTHER): Payer: Self-pay

## 2022-12-31 ENCOUNTER — Ambulatory Visit (INDEPENDENT_AMBULATORY_CARE_PROVIDER_SITE_OTHER): Payer: 59 | Admitting: Obstetrics & Gynecology

## 2022-12-31 ENCOUNTER — Telehealth (HOSPITAL_BASED_OUTPATIENT_CLINIC_OR_DEPARTMENT_OTHER): Payer: Self-pay | Admitting: Obstetrics & Gynecology

## 2022-12-31 ENCOUNTER — Other Ambulatory Visit (HOSPITAL_COMMUNITY)
Admission: RE | Admit: 2022-12-31 | Discharge: 2022-12-31 | Disposition: A | Discharge status: Home / Self Care | Payer: 59 | Source: Ambulatory Visit | Attending: Obstetrics & Gynecology | Admitting: Obstetrics & Gynecology

## 2022-12-31 ENCOUNTER — Encounter (HOSPITAL_BASED_OUTPATIENT_CLINIC_OR_DEPARTMENT_OTHER): Payer: Self-pay | Admitting: Obstetrics & Gynecology

## 2022-12-31 VITALS — BP 119/60 | HR 64 | Ht 66.0 in | Wt 264.4 lb

## 2022-12-31 DIAGNOSIS — Z01419 Encounter for gynecological examination (general) (routine) without abnormal findings: Secondary | ICD-10-CM

## 2022-12-31 DIAGNOSIS — L292 Pruritus vulvae: Secondary | ICD-10-CM

## 2022-12-31 DIAGNOSIS — B977 Papillomavirus as the cause of diseases classified elsewhere: Secondary | ICD-10-CM | POA: Diagnosis not present

## 2022-12-31 DIAGNOSIS — Z90710 Acquired absence of both cervix and uterus: Secondary | ICD-10-CM

## 2022-12-31 DIAGNOSIS — Z13820 Encounter for screening for osteoporosis: Secondary | ICD-10-CM

## 2022-12-31 DIAGNOSIS — Z8619 Personal history of other infectious and parasitic diseases: Secondary | ICD-10-CM | POA: Diagnosis not present

## 2022-12-31 DIAGNOSIS — Z1151 Encounter for screening for human papillomavirus (HPV): Secondary | ICD-10-CM | POA: Insufficient documentation

## 2022-12-31 DIAGNOSIS — N3941 Urge incontinence: Secondary | ICD-10-CM

## 2022-12-31 MED ORDER — ESTRADIOL 0.1 MG/GM VA CREA
1.0000 g | TOPICAL_CREAM | VAGINAL | 1 refills | Status: DC
  Filled 2022-12-31: qty 42.5, 30d supply, fill #0
  Filled 2023-03-08: qty 42.5, 30d supply, fill #1

## 2022-12-31 MED ORDER — MOMETASONE FUROATE 0.1 % EX CREA
1.0000 | TOPICAL_CREAM | CUTANEOUS | 1 refills | Status: DC
  Filled 2022-12-31: qty 45, 30d supply, fill #0

## 2022-12-31 NOTE — Progress Notes (Unsigned)
67 y.o. [id number] Divorced White or Caucasian female here for annual exam/new patient appointment.  H/o hysterectomy with bilateral salpingectomy.  Did have LGSIL and with +HR HPV.  Pathology reviewed and cervical pathology was negative.  Discussed guidelines.  She did not have any pap smears after hysterectomy.    Patient's last menstrual period was 05/20/2008.          Sexually active: No.  The current method of family planning is status post hysterectomy.    Smoker:  no  Health Maintenance: Pap:  will obtain today History of abnormal Pap:  h/o LGSIL pap MMG:  06/11/2022, had cyst aspiration 06/21/2022 Colonoscopy:  08/2022 follow up 10 years BMD:   ordered Screening Labs: Dr. Mardelle Matte   reports that she has never smoked. She has never used smokeless tobacco. She reports current alcohol use. She reports that she does not use drugs.  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   Diabetes (HCC)    Diverticulosis    Minimal   Fatty liver    GERD (gastroesophageal reflux disease)    Heart murmur    Hepatic steatosis    History of hiatal hernia 2017   Small   HLA B27 (HLA B27 positive)    Hx of pancreatitis    Hyperlipidemia    Hypertension    IBS (irritable bowel syndrome)    Ischemic colitis (HCC)    Lactose intolerance    Lumbar scoliosis 2010   Mild   Migraine headache without aura 05/10/2015   Nephrolithiasis 2014   Left, minimal   Obesity due to excess calories with serious comorbidity 05/24/2019   Palpitations    Prediabetes    Presbyopia    Right bundle branch block    Sepsis (HCC)  01/2016   Sleep apnea    Sleep hypopnea    Vitamin D deficiency     Past Surgical History:  Procedure Laterality Date   BREAST BIOPSY Left    CERVICAL CONE BIOPSY     CHOLECYSTECTOMY N/A 10/26/2014   Procedure: LAPAROSCOPIC CHOLECYSTECTOMY;  Surgeon: Abigail Miyamoto, MD;  Location: [facility];  Service: General;  Laterality: N/A;   COLONOSCOPY W/ BIOPSIES  multiple   CYSTOSCOPY WITH RETROGRADE PYELOGRAM, URETEROSCOPY AND STENT PLACEMENT Left 06/26/2018   Procedure: CYSTOSCOPY WITH RETROGRADE PYELOGRAM, URETEROSCOPY AND STENT PLACEMENT;  Surgeon: Sebastian Ache, MD;  Location: [facility];  Service: Urology;  Laterality: Left;   DIAGNOSTIC LAPAROSCOPY     DILATION AND CURETTAGE OF UTERUS  11/2009   DUPUYTREN CONTRACTURE RELEASE Left    x 3 , Dr Amanda Pea   ESOPHAGOGASTRODUODENOSCOPY  multiple   EYE SURGERY     HOLMIUM LASER APPLICATION Left 06/26/2018   Procedure: HOLMIUM LASER APPLICATION;  Surgeon: Sebastian Ache, MD;  Location: [facility];  Service: Urology;  Laterality: Left;   KNEE ARTHROSCOPY Left 11/2008   LAPAROSCOPIC VAGINAL HYSTERECTOMY WITH SALPINGO OOPHORECTOMY Bilateral 10/03/2015   Procedure: LAPAROSCOPIC ASSISTED VAGINAL HYSTERECTOMY WITH SALPINGECTOMY;  Surgeon: Hal Morales, MD;  Location: [facility];  Service: Gynecology;  Laterality: Bilateral;   LASIK  1990   RK   LEFT FINGER SURGERY     LIVER BIOPSY     MASS EXCISION Left 12/24/2012   Procedure: LEFT EXCISION OF PALMAR MASS X TWO;  Surgeon: Dominica Severin, MD;  Location: [facility];  Service: Orthopedics;  Laterality: Left;   PHOTOREFRACTIVE KERATOTOMY  1994   RADIAL KERATOTOMY  1994   TRANSOBTURATOR SLING      Current Outpatient Medications  Medication Sig Dispense Refill   amphetamine-dextroamphetamine (ADDERALL) 10 MG tablet Take 1 tablet (10 mg total) by mouth daily as needed. 30 tablet 0   aspirin 325 MG tablet Take 325 mg by mouth daily.      cholecalciferol (VITAMIN D) 1000 UNITS tablet Take 2,000 Units by mouth daily.      cycloSPORINE (RESTASIS) 0.05 % ophthalmic emulsion Place 1 drop into both eyes 2 times daily. 180 each 3   fluticasone (FLONASE) 50 MCG/ACT nasal spray Place 1 spray into both nostrils in the morning and at bedtime. 16 g 6   furosemide (LASIX) 40 MG tablet Take 1 tablet (40 mg total) by mouth daily. 90 tablet 1   losartan (COZAAR) 100 MG tablet Take 1 tablet (100 mg total) by mouth daily. 90 tablet 3   Lutein 6 MG CAPS Take 6 mg by mouth daily.     metoprolol succinate (TOPROL-XL) 50 MG 24 hr tablet Take 1 tablet (50 mg total) by mouth daily with or immediately following a meal. 270 tablet 3   metoprolol succinate (TOPROL-XL) 50 MG 24 hr tablet Take 1.5 tablets (75 mg total) by mouth in the morning AND 1 tablet (50 mg total) at bedtime. 90 tablet 3   pimecrolimus (ELIDEL) 1 % cream Apply twice a day to eye brow area as needed for irritation 30 g 0   potassium chloride SA (KLOR-CON M) 20 MEQ tablet Take 1 tablet (20 mEq total) by mouth daily. 90 tablet 1   rosuvastatin (CRESTOR) 20 MG tablet Take 1 tablet (20 mg total) by mouth daily. 90 tablet 3   tirzepatide (MOUNJARO) 5 MG/0.5ML Pen Inject 5 mg into the skin once a week. 6 mL 0   Turmeric 450 MG CAPS Take 450 mg by mouth daily.      No current facility-administered medications for this visit.    Family History  Problem Relation Age of Onset   Coronary artery disease Mother    Hypertension Mother    Heart disease Mother    Heart attack Mother    Diabetes Mother    Hyperlipidemia Mother    Kidney disease Mother    Depression Mother    Sleep apnea Mother    Obesity Mother    Heart attack Father    Heart disease Father    Sudden death Father    Hypertension Sister    Hypertension Brother    Colon cancer Neg Hx    Colon polyps Neg Hx    Esophageal cancer Neg Hx    Rectal cancer Neg Hx    Stomach cancer Neg Hx     ROS: Constitutional:  negative Genitourinary:negative  Exam:   BP 119/60 (BP Location: Right Arm, Patient Position: Sitting, Cuff Size: Large)   Pulse 64   Ht 5\' 6"  (1.676 m) Comment: Reported  Wt 264 lb 6.4 oz (119.9 kg)   LMP 05/20/2008   BMI 42.68 kg/m   Height: 5\' 6"  (167.6 cm) (Reported)  General appearance: alert, cooperative and appears stated age  Head: Normocephalic, without obvious abnormality, atraumatic Neck: no adenopathy, supple, symmetrical, trachea midline and thyroid normal to inspection and palpation Lungs: clear to auscultation bilaterally Breasts: normal appearance, no masses or tenderness Heart: regular rate and rhythm Abdomen: soft, non-tender; bowel sounds normal; no masses,  no organomegaly Extremities: extremities normal, atraumatic, no cyanosis or edema Skin: Skin color, texture, turgor normal. No rashes or lesions Lymph nodes: Cervical, supraclavicular, and axillary nodes normal. No abnormal inguinal nodes palpated Neurologic: Grossly normal   Pelvic: External genitalia:  no lesions              Urethra:  normal appearing urethra with no masses, tenderness or lesions              Bartholins and Skenes: normal                 Vagina: normal appearing vagina with normal color and no discharge, no lesions              Cervix: absent              Pap taken: Yes.   Bimanual Exam:  Uterus:  uterus absent              Adnexa: no mass, fullness, tenderness               Rectovaginal: Confirms               Anus:  normal sphincter tone, no lesions  Chaperone, Ina Homes, CMA, was present for exam.  Assessment/Plan:

## 2023-01-01 NOTE — Telephone Encounter (Signed)
Opened in error

## 2023-01-02 ENCOUNTER — Encounter (HOSPITAL_BASED_OUTPATIENT_CLINIC_OR_DEPARTMENT_OTHER): Payer: Self-pay | Admitting: Obstetrics & Gynecology

## 2023-01-02 LAB — URINE CULTURE

## 2023-01-03 ENCOUNTER — Other Ambulatory Visit (HOSPITAL_BASED_OUTPATIENT_CLINIC_OR_DEPARTMENT_OTHER): Payer: Self-pay

## 2023-01-03 ENCOUNTER — Other Ambulatory Visit (HOSPITAL_BASED_OUTPATIENT_CLINIC_OR_DEPARTMENT_OTHER): Payer: Self-pay | Admitting: Obstetrics & Gynecology

## 2023-01-03 DIAGNOSIS — N3000 Acute cystitis without hematuria: Secondary | ICD-10-CM

## 2023-01-03 LAB — URINALYSIS, MICROSCOPIC ONLY

## 2023-01-03 MED ORDER — SULFAMETHOXAZOLE-TRIMETHOPRIM 800-160 MG PO TABS
1.0000 | ORAL_TABLET | Freq: Two times a day (BID) | ORAL | 0 refills | Status: DC
  Filled 2023-01-03: qty 10, 5d supply, fill #0

## 2023-01-06 ENCOUNTER — Encounter (INDEPENDENT_AMBULATORY_CARE_PROVIDER_SITE_OTHER): Payer: Self-pay | Admitting: Family Medicine

## 2023-01-06 ENCOUNTER — Other Ambulatory Visit (HOSPITAL_BASED_OUTPATIENT_CLINIC_OR_DEPARTMENT_OTHER): Payer: Self-pay

## 2023-01-06 ENCOUNTER — Ambulatory Visit (INDEPENDENT_AMBULATORY_CARE_PROVIDER_SITE_OTHER): Payer: 59 | Admitting: Family Medicine

## 2023-01-06 VITALS — BP 116/74 | HR 59 | Temp 98.8°F | Ht 66.0 in | Wt 260.0 lb

## 2023-01-06 DIAGNOSIS — E1165 Type 2 diabetes mellitus with hyperglycemia: Secondary | ICD-10-CM | POA: Diagnosis not present

## 2023-01-06 DIAGNOSIS — Z6841 Body Mass Index (BMI) 40.0 and over, adult: Secondary | ICD-10-CM | POA: Diagnosis not present

## 2023-01-06 DIAGNOSIS — Z7985 Long-term (current) use of injectable non-insulin antidiabetic drugs: Secondary | ICD-10-CM

## 2023-01-06 DIAGNOSIS — I152 Hypertension secondary to endocrine disorders: Secondary | ICD-10-CM | POA: Diagnosis not present

## 2023-01-06 DIAGNOSIS — E669 Obesity, unspecified: Secondary | ICD-10-CM | POA: Diagnosis not present

## 2023-01-06 DIAGNOSIS — E1159 Type 2 diabetes mellitus with other circulatory complications: Secondary | ICD-10-CM | POA: Diagnosis not present

## 2023-01-06 MED ORDER — TIRZEPATIDE 5 MG/0.5ML ~~LOC~~ SOAJ
5.0000 mg | SUBCUTANEOUS | 1 refills | Status: DC
  Filled 2023-01-06: qty 2, 28d supply, fill #0
  Filled 2023-01-27 – 2023-01-29 (×2): qty 2, 28d supply, fill #1

## 2023-01-06 NOTE — Progress Notes (Signed)
Chief Complaint:   OBESITY Alexandra Walker is here to discuss her progress with her obesity treatment plan along with follow-up of her obesity related diagnoses. Alexandra Walker is on keeping a food journal and adhering to recommended goals of 1500-1600 calories and 95+ grams of protein and states she is following her eating plan approximately 75% of the time. Alexandra Walker states she has been doing some walking on the weekends.    Today's visit was #: 17 Starting weight: 265 lbs Starting date: 05/24/2021 Today's weight: 260 lbs Today's date: 01/06/2023 Total lbs lost to date: 5 Total lbs lost since last in-office visit: 0  Interim History: Patient feels like she is ready to quit.  She does recognize she isn't making the commitment she needs to in order to achieve the calorie and protein goal.  She feels like sometimes when she eats meat she feels so full she can't eat again for the rest of the day. She is still able to get enjoyment out of some things but lacks energy to do activity. Isn't sure if she is physically or mentally fatigued.    Subjective:   1. Type 2 diabetes mellitus with hyperglycemia, without long-term current use of insulin (HCC) Patient is on Mounjaro weekly, previously on metformin.  She notes that satiety is somewhat impacted, but still has cravings.  She struggled with consistency on metformin.  2. Hypertension associated with diabetes (HCC) Patient's blood pressure is controlled today.  She denies chest pain, chest pressure, or headache.  She is on Lasix, Cozaar, and Toprol.  Assessment/Plan:   1. Type 2 diabetes mellitus with hyperglycemia, without long-term current use of insulin (HCC) Patient will continue Mounjaro 5 mg once weekly, and we will refill for 90 days.  - tirzepatide [facility]) 5 MG/0.5ML Pen; Inject 5 mg into the skin once a week.  Dispense: 6 mL; Refill: 1  2. Hypertension associated with diabetes [facility]) Patient will continue her current medications with no change in  dose.  3. BMI 40.0-44.9, adult (HCC)  4. Obesity with starting BMI of 42.7 Alexandra Walker is currently in the action stage of change. As such, her goal is to continue with weight loss efforts. She has agreed to keeping a food journal and adhering to recommended goals of 1500-1600 calories and 95+ grams of protein daily.   Exercise goals: All adults should avoid inactivity. Some physical activity is better than none, and adults who participate in any amount of physical activity gain some health benefits.  Behavioral modification strategies: no skipping meals, meal planning and cooking strategies, keeping healthy foods in the home, and keeping a strict food journal.  Alexandra Walker has agreed to follow-up with our clinic as needed. She was informed of the importance of frequent follow-up visits to maximize her success with intensive lifestyle modifications for her multiple health conditions.   Objective:   Blood pressure 116/74, pulse (!) 59, temperature 98.8 F (37.1 C), height 5\' 6"  (1.676 m), weight 260 lb (117.9 kg), last menstrual period 05/20/2008, SpO2 98%. Body mass index is 41.97 kg/m.  General: Cooperative, alert, well developed, in no acute distress. HEENT: Conjunctivae and lids unremarkable. Cardiovascular: Regular rhythm.  Lungs: Normal work of breathing. Neurologic: No focal deficits.   Lab Results  Component Value Date   CREATININE 0.86 08/09/2022   BUN 24 (H) 08/09/2022   NA 141 08/09/2022   K 4.2 08/09/2022   CL 105 08/09/2022   CO2 28 08/09/2022   Lab Results  Component Value Date   ALT  17 08/09/2022   AST 13 08/09/2022   ALKPHOS 75 08/09/2022   BILITOT 0.4 08/09/2022   Lab Results  Component Value Date   HGBA1C 5.7 (A) 09/02/2022   HGBA1C 5.7 (A) 05/27/2022   HGBA1C 6.2 (H) 09/03/2021   HGBA1C 6.3 (A) 05/07/2021   HGBA1C 6.8 (H) 11/13/2020   Lab Results  Component Value Date   INSULIN 14.6 09/03/2021   INSULIN 12.5 05/24/2021   INSULIN 12.5 03/13/2017   INSULIN  10.0 10/28/2016   Lab Results  Component Value Date   TSH 1.545 07/09/2022   Lab Results  Component Value Date   CHOL 144 05/27/2022   HDL 38.60 (L) 05/27/2022   LDLCALC 75 05/27/2022   LDLDIRECT 118.0 05/24/2019   TRIG 152.0 (H) 05/27/2022   CHOLHDL 4 05/27/2022   Lab Results  Component Value Date   VD25OH 46.6 09/03/2021   VD25OH 50.7 05/24/2021   VD25OH 58.00 11/13/2020   Lab Results  Component Value Date   WBC 6.2 08/09/2022   HGB 12.6 08/09/2022   HCT 38.4 08/09/2022   MCV 84.7 08/09/2022   PLT 213.0 08/09/2022   No results found for: "IRON", "TIBC", "FERRITIN"  Attestation Statements:   Reviewed by clinician on day of visit: allergies, medications, problem list, medical history, surgical history, family history, social history, and previous encounter notes.   I, Burt Knack, am acting as transcriptionist for Reuben Likes, MD.  I have reviewed the above documentation for accuracy and completeness, and I agree with the above. - Reuben Likes, MD

## 2023-01-22 ENCOUNTER — Other Ambulatory Visit (HOSPITAL_BASED_OUTPATIENT_CLINIC_OR_DEPARTMENT_OTHER): Payer: Self-pay

## 2023-01-27 DIAGNOSIS — Z08 Encounter for follow-up examination after completed treatment for malignant neoplasm: Secondary | ICD-10-CM | POA: Diagnosis not present

## 2023-01-27 DIAGNOSIS — Z85828 Personal history of other malignant neoplasm of skin: Secondary | ICD-10-CM | POA: Diagnosis not present

## 2023-01-27 DIAGNOSIS — L814 Other melanin hyperpigmentation: Secondary | ICD-10-CM | POA: Diagnosis not present

## 2023-01-27 DIAGNOSIS — L910 Hypertrophic scar: Secondary | ICD-10-CM | POA: Diagnosis not present

## 2023-01-27 DIAGNOSIS — B351 Tinea unguium: Secondary | ICD-10-CM | POA: Diagnosis not present

## 2023-01-27 DIAGNOSIS — L578 Other skin changes due to chronic exposure to nonionizing radiation: Secondary | ICD-10-CM | POA: Diagnosis not present

## 2023-01-27 DIAGNOSIS — D225 Melanocytic nevi of trunk: Secondary | ICD-10-CM | POA: Diagnosis not present

## 2023-01-27 DIAGNOSIS — L2089 Other atopic dermatitis: Secondary | ICD-10-CM | POA: Diagnosis not present

## 2023-01-27 DIAGNOSIS — L821 Other seborrheic keratosis: Secondary | ICD-10-CM | POA: Diagnosis not present

## 2023-01-28 ENCOUNTER — Other Ambulatory Visit (HOSPITAL_BASED_OUTPATIENT_CLINIC_OR_DEPARTMENT_OTHER): Payer: Self-pay

## 2023-01-29 ENCOUNTER — Other Ambulatory Visit: Payer: Self-pay

## 2023-02-24 ENCOUNTER — Encounter (HOSPITAL_BASED_OUTPATIENT_CLINIC_OR_DEPARTMENT_OTHER): Payer: Self-pay | Admitting: Obstetrics & Gynecology

## 2023-02-26 ENCOUNTER — Other Ambulatory Visit (HOSPITAL_BASED_OUTPATIENT_CLINIC_OR_DEPARTMENT_OTHER): Payer: Self-pay | Admitting: *Deleted

## 2023-02-26 DIAGNOSIS — N3000 Acute cystitis without hematuria: Secondary | ICD-10-CM | POA: Diagnosis not present

## 2023-03-01 LAB — URINE CULTURE

## 2023-03-02 ENCOUNTER — Other Ambulatory Visit (HOSPITAL_BASED_OUTPATIENT_CLINIC_OR_DEPARTMENT_OTHER): Payer: Self-pay | Admitting: Obstetrics & Gynecology

## 2023-03-02 DIAGNOSIS — N3090 Cystitis, unspecified without hematuria: Secondary | ICD-10-CM

## 2023-03-03 ENCOUNTER — Other Ambulatory Visit (HOSPITAL_BASED_OUTPATIENT_CLINIC_OR_DEPARTMENT_OTHER): Payer: Self-pay

## 2023-03-03 ENCOUNTER — Other Ambulatory Visit (HOSPITAL_BASED_OUTPATIENT_CLINIC_OR_DEPARTMENT_OTHER): Payer: Self-pay | Admitting: *Deleted

## 2023-03-03 MED ORDER — NITROFURANTOIN MONOHYD MACRO 100 MG PO CAPS
100.0000 mg | ORAL_CAPSULE | Freq: Two times a day (BID) | ORAL | 0 refills | Status: AC
  Filled 2023-03-03: qty 10, 5d supply, fill #0

## 2023-03-04 ENCOUNTER — Other Ambulatory Visit (HOSPITAL_BASED_OUTPATIENT_CLINIC_OR_DEPARTMENT_OTHER): Payer: Self-pay

## 2023-03-04 ENCOUNTER — Ambulatory Visit: Payer: 59 | Admitting: Family Medicine

## 2023-03-04 ENCOUNTER — Encounter: Payer: Self-pay | Admitting: Family Medicine

## 2023-03-04 ENCOUNTER — Encounter: Payer: Self-pay | Admitting: Neurology

## 2023-03-04 VITALS — BP 118/84 | HR 69 | Temp 98.0°F | Ht 66.0 in | Wt 266.0 lb

## 2023-03-04 DIAGNOSIS — Z23 Encounter for immunization: Secondary | ICD-10-CM | POA: Diagnosis not present

## 2023-03-04 DIAGNOSIS — E782 Mixed hyperlipidemia: Secondary | ICD-10-CM

## 2023-03-04 DIAGNOSIS — E559 Vitamin D deficiency, unspecified: Secondary | ICD-10-CM | POA: Diagnosis not present

## 2023-03-04 DIAGNOSIS — D6861 Antiphospholipid syndrome: Secondary | ICD-10-CM | POA: Diagnosis not present

## 2023-03-04 DIAGNOSIS — N200 Calculus of kidney: Secondary | ICD-10-CM | POA: Diagnosis not present

## 2023-03-04 DIAGNOSIS — Z0000 Encounter for general adult medical examination without abnormal findings: Secondary | ICD-10-CM

## 2023-03-04 DIAGNOSIS — E1159 Type 2 diabetes mellitus with other circulatory complications: Secondary | ICD-10-CM | POA: Diagnosis not present

## 2023-03-04 DIAGNOSIS — E119 Type 2 diabetes mellitus without complications: Secondary | ICD-10-CM

## 2023-03-04 DIAGNOSIS — E1169 Type 2 diabetes mellitus with other specified complication: Secondary | ICD-10-CM

## 2023-03-04 DIAGNOSIS — I471 Supraventricular tachycardia, unspecified: Secondary | ICD-10-CM

## 2023-03-04 DIAGNOSIS — I152 Hypertension secondary to endocrine disorders: Secondary | ICD-10-CM | POA: Diagnosis not present

## 2023-03-04 DIAGNOSIS — Z0001 Encounter for general adult medical examination with abnormal findings: Secondary | ICD-10-CM

## 2023-03-04 DIAGNOSIS — G4733 Obstructive sleep apnea (adult) (pediatric): Secondary | ICD-10-CM

## 2023-03-04 DIAGNOSIS — K760 Fatty (change of) liver, not elsewhere classified: Secondary | ICD-10-CM

## 2023-03-04 LAB — CBC WITH DIFFERENTIAL/PLATELET
Basophils Absolute: 0 10*3/uL (ref 0.0–0.1)
Basophils Relative: 0.6 % (ref 0.0–3.0)
Eosinophils Absolute: 0.2 10*3/uL (ref 0.0–0.7)
Eosinophils Relative: 2.7 % (ref 0.0–5.0)
HCT: 44.1 % (ref 36.0–46.0)
Hemoglobin: 14.1 g/dL (ref 12.0–15.0)
Lymphocytes Relative: 20.4 % (ref 12.0–46.0)
Lymphs Abs: 1.4 10*3/uL (ref 0.7–4.0)
MCHC: 32 g/dL (ref 30.0–36.0)
MCV: 83.9 fL (ref 78.0–100.0)
Monocytes Absolute: 0.6 10*3/uL (ref 0.1–1.0)
Monocytes Relative: 8.9 % (ref 3.0–12.0)
Neutro Abs: 4.6 10*3/uL (ref 1.4–7.7)
Neutrophils Relative %: 67.4 % (ref 43.0–77.0)
Platelets: 265 10*3/uL (ref 150.0–400.0)
RBC: 5.25 Mil/uL — ABNORMAL HIGH (ref 3.87–5.11)
RDW: 13.4 % (ref 11.5–15.5)
WBC: 6.9 10*3/uL (ref 4.0–10.5)

## 2023-03-04 LAB — COMPREHENSIVE METABOLIC PANEL
ALT: 13 U/L (ref 0–35)
AST: 12 U/L (ref 0–37)
Albumin: 4 g/dL (ref 3.5–5.2)
Alkaline Phosphatase: 95 U/L (ref 39–117)
BUN: 17 mg/dL (ref 6–23)
CO2: 28 meq/L (ref 19–32)
Calcium: 9.6 mg/dL (ref 8.4–10.5)
Chloride: 105 meq/L (ref 96–112)
Creatinine, Ser: 0.76 mg/dL (ref 0.40–1.20)
GFR: 82.48 mL/min (ref >60.00)
Glucose, Bld: 111 mg/dL — ABNORMAL HIGH (ref 70–99)
Potassium: 4.5 meq/L (ref 3.5–5.1)
Sodium: 139 meq/L (ref 135–145)
Total Bilirubin: 0.6 mg/dL (ref 0.2–1.2)
Total Protein: 6.5 g/dL (ref 6.0–8.3)

## 2023-03-04 LAB — HEMOGLOBIN A1C: Hgb A1c MFr Bld: 6.2 % (ref 4.6–6.5)

## 2023-03-04 LAB — VITAMIN D 25 HYDROXY (VIT D DEFICIENCY, FRACTURES): VITD: 29.93 ng/mL — ABNORMAL LOW (ref 30.00–100.00)

## 2023-03-04 LAB — LIPID PANEL
Cholesterol: 137 mg/dL (ref 0–200)
HDL: 41 mg/dL (ref >39.00)
LDL Cholesterol: 69 mg/dL (ref 0–99)
NonHDL: 95.98
Total CHOL/HDL Ratio: 3
Triglycerides: 133 mg/dL (ref 0.0–149.0)
VLDL: 26.6 mg/dL (ref 0.0–40.0)

## 2023-03-04 LAB — MICROALBUMIN / CREATININE URINE RATIO
Creatinine,U: 135.4 mg/dL
Microalb Creat Ratio: 0.7 mg/g (ref 0.0–30.0)
Microalb, Ur: 0.9 mg/dL (ref 0.0–1.9)

## 2023-03-04 LAB — TSH: TSH: 2.5 u[IU]/mL (ref 0.35–5.50)

## 2023-03-04 MED ORDER — AMPHETAMINE-DEXTROAMPHETAMINE 10 MG PO TABS: 10.0000 mg | ORAL_TABLET | Freq: Every day | ORAL | 0 refills | Status: DC | PRN

## 2023-03-04 MED ORDER — AMPHETAMINE-DEXTROAMPHETAMINE 10 MG PO TABS
10.0000 mg | ORAL_TABLET | Freq: Every day | ORAL | 0 refills | Status: DC | PRN
  Filled 2023-03-04: qty 30, 30d supply, fill #0

## 2023-03-04 MED ORDER — TIRZEPATIDE 7.5 MG/0.5ML ~~LOC~~ SOAJ
7.5000 mg | SUBCUTANEOUS | 1 refills | Status: DC
  Filled 2023-03-04: qty 2, 28d supply, fill #0
  Filled 2023-04-28: qty 2, 28d supply, fill #1
  Filled 2023-05-17 – 2023-06-02 (×2): qty 2, 28d supply, fill #2

## 2023-03-04 NOTE — Progress Notes (Signed)
Subjective  Chief Complaint  Patient presents with   Annual Exam    Pt here for Annual Exam and is currently fasting    Diabetes   Hypertension    HPI: Alexandra Walker is a 67 y.o. female who presents to [facility] [place] today for a Female Wellness Visit. She also has the concerns and/or needs as listed above in the chief complaint. These will be addressed in addition to the Health Maintenance Visit.   Wellness Visit: annual visit with health maintenance review and exam  HM: screens are current. Mammo due in December. Colonoscopy w/ tics only. Imms up to date: to get flu shot today Chronic disease f/u and/or acute problem visit: (deemed necessary to be done in addition to the wellness visit): DM/obesity: on mounjaro x 1 year approx. 5mg . Struggling with weight loss but sugars are controlled. Has a sweet tooth and dislikes veggies. Has worked with healthy weight and wellness but no longer wishes to continue there. No complications from dm. On arb and statin HTN: good control on arb by report. Diastolic a little up today in office. No cp Psvt; controlled on bb HLD on statin and ready for recheck. No adverse effects.  NAFLD: monitoring. No symptoms.  Has kidney stones and h/o recurrent pyelo; now with back to back infection on abx. Has appt with urology tomorrow to assess.  Assessment  1. Encounter for well adult exam with abnormal findings   2. Controlled type 2 diabetes mellitus without complication, without long-term current use of insulin (HCC)   3. Hypertension associated with diabetes (HCC)   4. Combined hyperlipidemia associated with type 2 diabetes mellitus (HCC)   5. Morbid obesity (HCC)   6. OSA on CPAP, followed by Neurology/Sleep   7. Paroxysmal SVT (supraventricular tachycardia) (HCC)   8. 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HCC)   9. NAFLD (nonalcoholic fatty liver disease)   10. Nephrolithiasis   11. Vitamin D deficiency, on Vit D 2000 IU daily   12. Need  for influenza vaccination      Plan  Female Wellness Visit: Age appropriate Health Maintenance and Prevention measures were discussed with patient. Included topics are cancer screening recommendations, ways to keep healthy (see AVS) including dietary and exercise recommendations, regular eye and dental care, use of seat belts, and avoidance of moderate alcohol use and tobacco use. Screens  BMI: discussed patient's BMI and encouraged positive lifestyle modifications to help get to or maintain a target BMI. HM needs and immunizations were addressed and ordered. See below for orders. See HM and immunization section for updates. Flu shot today Routine labs and screening tests ordered including cmp, cbc and lipids where appropriate. Discussed recommendations regarding Vit D and calcium supplementation (see AVS)  Chronic disease management visit and/or acute problem visit: DM: good control. Will increase mounjaro to 7.5mg  weekly for further weight loss. Discussed dietary goals. Start weight training. Decrease sweets.  Check fasting lipids.  Monitor lfts and renal Continue losartan 50 for controlled bp Vit D oral supplements and recheck.   Follow up: 3 mo for weight recheck  Orders Placed This Encounter  Procedures   Flu vaccine trivalent PF, 6mos and older(Flulaval,Afluria,Fluarix,Fluzone)   CBC with Differential/Platelet   Comprehensive metabolic panel   Lipid panel   Hemoglobin A1c   TSH   Microalbumin / creatinine urine ratio   VITAMIN D 25 Hydroxy (Vit-D Deficiency, Fractures)   Meds ordered this encounter  Medications   amphetamine-dextroamphetamine (ADDERALL) 10 MG tablet  Sig: Take 1 tablet (10 mg total) by mouth daily as needed.    Dispense:  30 tablet    Refill:  0   amphetamine-dextroamphetamine (ADDERALL) 10 MG tablet    Sig: Take 1 tablet (10 mg total) by mouth daily as needed.    Dispense:  30 tablet    Refill:  0   tirzepatide (MOUNJARO) 7.5 MG/0.5ML Pen    Sig:  Inject 7.5 mg into the skin once a week.    Dispense:  6 mL    Refill:  1      Body mass index is 42.93 kg/m. Wt Readings from Last 3 Encounters:  03/04/23 266 lb (120.7 kg)  01/06/23 260 lb (117.9 kg)  12/31/22 264 lb 6.4 oz (119.9 kg)     Patient Active Problem List   Diagnosis Date Noted Date Diagnosed   Controlled type 2 diabetes mellitus without complication, without long-term current use of insulin (HCC) 05/27/2022     Priority: High   Morbid obesity (HCC) 08/07/2020     Priority: High   Attention deficit hyperactivity disorder (ADHD), combined type 10/19/2017     Priority: High   OSA on CPAP, followed by Neurology/Sleep 09/26/2016     Priority: High    Dr. Vickey Huger. Sleep test repeat: 2023, mild, continue cpap. Auto titrated    Hypertension associated with diabetes (HCC) 01/31/2016     Priority: High   Combined hyperlipidemia associated with type 2 diabetes mellitus (HCC) 05/10/2015     Priority: High   Paroxysmal SVT (supraventricular tachycardia) (HCC) 09/19/2011     Priority: High    SVT/PACs controlled on BB; managed by cardiology. No CAD    Esophageal stricture 09/02/2022     Priority: Medium     EGD 08/2022: dilated. Otherwise normal    Bilateral hip bursitis 05/06/2019     Priority: Medium    Nephrolithiasis 03/29/2018     Priority: Medium     H/o recurrent pyelo/urosepsis; s/p stone retrieval and stenting.  Has nonobstructive stone that persists.  Dr. Berneice Heinrich    Anticardiolipin syndrome [facility])      Priority: Medium    Recurrent circadian rhythm sleep disorder, shift work type 03/31/2017     Priority: Medium    IBS (irritable bowel syndrome), diarrhea predominant 03/17/2017     Priority: Medium    NAFLD (nonalcoholic fatty liver disease) 60/45/4098     Priority: Medium    Bilateral lower extremity edema 09/02/2022     Priority: Low   Diverticulosis of colon 05/16/2018     Priority: Low    Colonoscopy 08/2022: tics throughout, o/w normal.     Drug-induced pancreatitis, 2015, thought to be due to [facility] 03/29/2018     Priority: Low   History of vitreous detachment OU, HTN retinopathy OU, High myopia OU, Cataracts OU, Vitreous floaters OU, Keratoconjunctivitis sicca OU not specified as Sjogren's 03/29/2018     Priority: Low   History of sepsis 02/01/2016     Priority: Low    Due to urosepsis: had acute short lived afib/psvt. Neg cardiac eval since.     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    Health Maintenance  Topic Date Due   HEMOGLOBIN A1C  03/04/2023   COVID-19 Vaccine (4 - 2023-24 season) 03/20/2023 (Originally 01/19/2023)   OPHTHALMOLOGY EXAM  04/30/2023   Diabetic kidney evaluation - Urine ACR  05/28/2023   MAMMOGRAM  06/11/2023   Diabetic kidney evaluation - eGFR measurement  08/09/2023   FOOT EXAM  03/03/2024   Cervical Cancer Screening (HPV/Pap Cotest)  12/31/2027   DTaP/Tdap/Td (3 - Td or Tdap) 10/31/2029   Colonoscopy  08/25/2032   INFLUENZA VACCINE  Completed   Hepatitis C Screening  Completed   HIV Screening  Completed   Zoster Vaccines- Shingrix  Completed   HPV VACCINES  Aged Out   Immunization History  Administered Date(s) Administered   Influenza, High Dose Seasonal PF 01/21/2013   Influenza, Seasonal, Injecte, Preservative Fre 03/04/2023   Influenza,inj,Quad PF,6+ Mos 01/21/2013, 03/09/2018, 03/19/2019, 03/13/2021, 02/26/2022   Influenza,inj,quad, With Preservative 02/18/2015   Influenza-Unspecified 02/18/2015, 02/23/2016, 03/13/2017, 03/20/2020   Moderna Sars-Covid-2 Vaccination 04/20/2019   PFIZER(Purple Top)SARS-COV-2 Vaccination 05/15/2019, 06/05/2019   PNEUMOCOCCAL CONJUGATE-20 05/07/2021   Pneumococcal Polysaccharide-23 08/01/2008   Rabies Immune Globulin 10/14/2011, 10/17/2011, 10/23/2011, 10/31/2011   Rabies, IM 10/14/2011,  10/14/2011, 10/17/2011, 10/23/2011, 10/31/2011   Rabies, intradermal 10/14/2011   Tdap 06/08/2007, 11/01/2019   Zoster Recombinant(Shingrix) 11/19/2021, 05/31/2022   We updated and reviewed the patient's past history in detail and it is documented below. Allergies: Patient is allergic to ace inhibitors, dust mite extract, oxycodone hcl, oxycodone-acetaminophen, tree extract, vicodin [hydrocodone-acetaminophen], victoza [liraglutide], and adhesive [tape]. Past Medical History Patient  has a past medical history of ADD (attention deficit disorder), Allergy, 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HCC), Atrial bigeminy (07/2022), Bilateral swelling of feet, Cholelithiasis, Chronic depressive disorder, Chronic diastolic CHF (congestive heart failure) (HCC), Circadian rhythm sleep disorder, shift work, DDD (degenerative disc disease), lumbosacral (05/2012), Diabetes (HCC), Diverticulosis, Fatty liver, GERD (gastroesophageal reflux disease), Heart murmur, Hepatic steatosis, History of hiatal hernia (2017), HLA B27 (HLA B27 positive), pancreatitis, Hyperlipidemia, Hypertension, IBS (irritable bowel syndrome), Ischemic colitis (HCC), Lactose intolerance, Lumbar scoliosis (2010), Migraine headache without aura (05/10/2015), Nephrolithiasis (2014), Obesity due to excess calories with serious comorbidity (05/24/2019), Palpitations, Prediabetes, Presbyopia, Right bundle branch block, Sepsis (HCC) (01/2016), Sleep apnea, Sleep hypopnea, and Vitamin D deficiency. Past Surgical History Patient  has a past surgical history that includes Colonoscopy w/ biopsies (multiple); Esophagogastroduodenoscopy (multiple); Knee arthroscopy (Left, 11/2008); Dilation and curettage of uterus (11/2009); Cervical cone biopsy; Mass excision (Left, 12/24/2012); Diagnostic laparoscopy; Cholecystectomy (N/A, 10/26/2014); Transobturator sling; Laparoscopic vaginal hysterectomy with salpingo oophorectomy (Bilateral, 10/03/2015); LEFT FINGER SURGERY; Breast  biopsy (Left); Eye surgery; LASIK (1990); Dupuytren contracture release (Left); Liver biopsy; Photorefractive keratotomy (1994); Cystoscopy with retrograde pyelogram, ureteroscopy and stent placement (Left, 06/26/2018); Holmium laser application (Left, 06/26/2018); and Radial keratotomy (1994). Family History: Patient family history includes Coronary artery disease in her mother; Depression in her mother; Diabetes in her mother; Heart attack in her father and mother; Heart disease in her father and mother; Hyperlipidemia in her mother; Hypertension in her brother, mother, and sister; Kidney disease in her mother; Obesity in her mother; Sleep apnea in her mother; Sudden death in her father. Social History:  Patient  reports that she has never smoked. She has never used smokeless tobacco. She reports current alcohol use. She reports that she does not use drugs.  Review of Systems: Constitutional: negative for fever or malaise Ophthalmic: negative for photophobia, double vision or loss of vision Cardiovascular: negative for chest pain, dyspnea on exertion, or new LE swelling Respiratory: negative for SOB or persistent cough Gastrointestinal: negative for abdominal pain, change in bowel habits or melena Genitourinary: negative for dysuria or gross hematuria, no abnormal uterine bleeding or disharge Musculoskeletal:  negative for new gait disturbance or muscular weakness Integumentary: negative for new or persistent rashes, no breast lumps Neurological: negative for TIA or stroke symptoms Psychiatric: negative for SI or delusions Allergic/Immunologic: negative for hives  Patient Care Team    Relationship Specialty Notifications Start End  Willow Ora, MD PCP - General Family Medicine  04/07/19   Kathleene Hazel, MD PCP - Cardiology Cardiology  08/21/20   Dominica Severin, MD Consulting Physician Orthopedic Surgery  02/11/19   Kathleene Hazel, MD Consulting Physician Cardiology   05/24/19   Alda Lea, NP Referring Physician Gastroenterology  05/24/19   Dohmeier, Porfirio Mylar, MD Consulting Physician Neurology  05/24/19   Loletta Parish., MD Consulting Physician Urology  05/24/19   Iva Boop, MD Consulting Physician Gastroenterology  05/24/19     Objective  Vitals: BP 118/84   Pulse 69   Temp 98 F (36.7 C)   Ht 5\' 6"  (1.676 m)   Wt 266 lb (120.7 kg)   LMP 05/20/2008   SpO2 96%   BMI 42.93 kg/m  General:  Well developed, well nourished, no acute distress  Psych:  Alert and orientedx3,normal mood and affect HEENT:  Normocephalic, atraumatic, non-icteric sclera,  supple neck without adenopathy, mass or thyromegaly Cardiovascular:  Normal S1, S2, RRR without gallop, rub or murmur Respiratory:  Good breath sounds bilaterally, CTAB with normal respiratory effort Gastrointestinal: normal bowel sounds, soft, non-tender, no noted masses. No HSM MSK: extremities without edema, joints without erythema or swelling Neurologic:    Mental status is normal.  Gross motor and sensory exams are normal.  No tremor  Commons side effects, risks, benefits, and alternatives for medications and treatment plan prescribed today were discussed, and the patient expressed understanding of the given instructions. Patient is instructed to call or message via MyChart if he/she has any questions or concerns regarding our treatment plan. No barriers to understanding were identified. We discussed Red Flag symptoms and signs in detail. Patient expressed understanding regarding what to do in case of urgent or emergency type symptoms.  Medication list was reconciled, printed and provided to the patient in AVS. Patient instructions and summary information was reviewed with the patient as documented in the AVS. This note was prepared with assistance of Dragon voice recognition software. Occasional wrong-word or sound-a-like substitutions may have occurred due to the inherent limitations of voice  recognition software

## 2023-03-05 DIAGNOSIS — N3020 Other chronic cystitis without hematuria: Secondary | ICD-10-CM | POA: Diagnosis not present

## 2023-03-05 DIAGNOSIS — N200 Calculus of kidney: Secondary | ICD-10-CM | POA: Diagnosis not present

## 2023-03-05 DIAGNOSIS — N952 Postmenopausal atrophic vaginitis: Secondary | ICD-10-CM | POA: Diagnosis not present

## 2023-03-05 NOTE — Progress Notes (Signed)
See mychart note Dear Ms. Weyenberg, Your lab results are attached and look mostly good. Your diabetes is controlled, going up on the mounjaro dose will hopefully help with the weight loss. Your lipids look good.  Your Vitamin D is borderline low as is your folate and Vitamin B12 levels. Please take an OTC MVI, an OTC vit b12 1000mg  daily and vit D OTC 2000 units daily.  Sincerely, Dr. Mardelle Matte

## 2023-03-06 ENCOUNTER — Encounter: Payer: 59 | Admitting: Family Medicine

## 2023-03-08 ENCOUNTER — Encounter: Payer: Self-pay | Admitting: Family Medicine

## 2023-03-08 ENCOUNTER — Other Ambulatory Visit: Payer: Self-pay | Admitting: Cardiovascular Disease

## 2023-03-08 ENCOUNTER — Other Ambulatory Visit: Payer: Self-pay

## 2023-03-10 ENCOUNTER — Other Ambulatory Visit (HOSPITAL_BASED_OUTPATIENT_CLINIC_OR_DEPARTMENT_OTHER): Payer: Self-pay

## 2023-03-10 MED ORDER — FUROSEMIDE 40 MG PO TABS
40.0000 mg | ORAL_TABLET | Freq: Every day | ORAL | 1 refills | Status: AC
  Filled 2023-03-10 – 2023-05-17 (×3): qty 90, 90d supply, fill #0

## 2023-03-12 ENCOUNTER — Other Ambulatory Visit (HOSPITAL_BASED_OUTPATIENT_CLINIC_OR_DEPARTMENT_OTHER): Payer: Self-pay | Admitting: *Deleted

## 2023-03-12 DIAGNOSIS — N3090 Cystitis, unspecified without hematuria: Secondary | ICD-10-CM | POA: Diagnosis not present

## 2023-03-14 LAB — URINE CULTURE

## 2023-03-19 ENCOUNTER — Other Ambulatory Visit (HOSPITAL_BASED_OUTPATIENT_CLINIC_OR_DEPARTMENT_OTHER): Payer: 59

## 2023-03-20 ENCOUNTER — Other Ambulatory Visit (HOSPITAL_BASED_OUTPATIENT_CLINIC_OR_DEPARTMENT_OTHER): Payer: Self-pay

## 2023-03-21 DIAGNOSIS — L570 Actinic keratosis: Secondary | ICD-10-CM | POA: Diagnosis not present

## 2023-04-07 ENCOUNTER — Ambulatory Visit (HOSPITAL_BASED_OUTPATIENT_CLINIC_OR_DEPARTMENT_OTHER)
Admission: RE | Admit: 2023-04-07 | Discharge: 2023-04-07 | Disposition: A | Discharge status: Home / Self Care | Payer: 59 | Source: Ambulatory Visit | Attending: Obstetrics & Gynecology | Admitting: Obstetrics & Gynecology

## 2023-04-07 DIAGNOSIS — Z13820 Encounter for screening for osteoporosis: Secondary | ICD-10-CM | POA: Insufficient documentation

## 2023-04-28 ENCOUNTER — Other Ambulatory Visit: Payer: Self-pay

## 2023-04-28 ENCOUNTER — Other Ambulatory Visit (HOSPITAL_BASED_OUTPATIENT_CLINIC_OR_DEPARTMENT_OTHER): Payer: Self-pay

## 2023-05-08 ENCOUNTER — Other Ambulatory Visit (HOSPITAL_BASED_OUTPATIENT_CLINIC_OR_DEPARTMENT_OTHER): Payer: Self-pay

## 2023-05-17 ENCOUNTER — Other Ambulatory Visit: Payer: Self-pay | Admitting: Family Medicine

## 2023-05-18 ENCOUNTER — Other Ambulatory Visit (HOSPITAL_BASED_OUTPATIENT_CLINIC_OR_DEPARTMENT_OTHER): Payer: Self-pay

## 2023-05-19 ENCOUNTER — Other Ambulatory Visit: Payer: Self-pay

## 2023-05-19 ENCOUNTER — Other Ambulatory Visit (HOSPITAL_BASED_OUTPATIENT_CLINIC_OR_DEPARTMENT_OTHER): Payer: Self-pay

## 2023-05-19 MED ORDER — AMPHETAMINE-DEXTROAMPHETAMINE 10 MG PO TABS
10.0000 mg | ORAL_TABLET | Freq: Every day | ORAL | 0 refills | Status: DC | PRN
  Filled 2023-05-19 – 2023-05-20 (×2): qty 30, 30d supply, fill #0

## 2023-05-20 ENCOUNTER — Encounter: Payer: Self-pay | Admitting: Family Medicine

## 2023-05-20 ENCOUNTER — Other Ambulatory Visit (HOSPITAL_BASED_OUTPATIENT_CLINIC_OR_DEPARTMENT_OTHER): Payer: Self-pay

## 2023-05-20 ENCOUNTER — Other Ambulatory Visit: Payer: Self-pay

## 2023-05-22 DIAGNOSIS — M79644 Pain in right finger(s): Secondary | ICD-10-CM | POA: Diagnosis not present

## 2023-06-02 ENCOUNTER — Other Ambulatory Visit (HOSPITAL_BASED_OUTPATIENT_CLINIC_OR_DEPARTMENT_OTHER): Payer: Self-pay

## 2023-06-02 ENCOUNTER — Encounter: Payer: Self-pay | Admitting: Internal Medicine

## 2023-06-02 DIAGNOSIS — S63636A Sprain of interphalangeal joint of right little finger, initial encounter: Secondary | ICD-10-CM | POA: Diagnosis not present

## 2023-06-03 DIAGNOSIS — S63636A Sprain of interphalangeal joint of right little finger, initial encounter: Secondary | ICD-10-CM | POA: Insufficient documentation

## 2023-06-09 ENCOUNTER — Ambulatory Visit: Payer: 59 | Admitting: Family Medicine

## 2023-06-13 ENCOUNTER — Other Ambulatory Visit (HOSPITAL_BASED_OUTPATIENT_CLINIC_OR_DEPARTMENT_OTHER): Payer: Self-pay

## 2023-06-13 ENCOUNTER — Ambulatory Visit: Payer: 59 | Admitting: Family Medicine

## 2023-06-13 VITALS — BP 138/82 | HR 71 | Temp 97.7°F | Ht 66.0 in | Wt 271.0 lb

## 2023-06-13 DIAGNOSIS — I152 Hypertension secondary to endocrine disorders: Secondary | ICD-10-CM | POA: Diagnosis not present

## 2023-06-13 DIAGNOSIS — Z7985 Long-term (current) use of injectable non-insulin antidiabetic drugs: Secondary | ICD-10-CM

## 2023-06-13 DIAGNOSIS — E119 Type 2 diabetes mellitus without complications: Secondary | ICD-10-CM

## 2023-06-13 DIAGNOSIS — M8580 Other specified disorders of bone density and structure, unspecified site: Secondary | ICD-10-CM | POA: Insufficient documentation

## 2023-06-13 DIAGNOSIS — I491 Atrial premature depolarization: Secondary | ICD-10-CM

## 2023-06-13 DIAGNOSIS — E1159 Type 2 diabetes mellitus with other circulatory complications: Secondary | ICD-10-CM

## 2023-06-13 LAB — POCT GLYCOSYLATED HEMOGLOBIN (HGB A1C): Hemoglobin A1C: 5.7 % — AB (ref 4.0–5.6)

## 2023-06-13 MED ORDER — TIRZEPATIDE 10 MG/0.5ML ~~LOC~~ SOAJ
10.0000 mg | SUBCUTANEOUS | 5 refills | Status: DC
  Filled 2023-06-13 – 2023-06-25 (×2): qty 2, 28d supply, fill #0
  Filled 2023-07-21: qty 2, 28d supply, fill #1
  Filled 2023-08-17: qty 2, 28d supply, fill #2
  Filled 2023-09-28: qty 2, 28d supply, fill #3
  Filled 2023-10-26: qty 2, 28d supply, fill #4
  Filled 2023-11-23: qty 2, 28d supply, fill #5

## 2023-06-13 MED ORDER — METOPROLOL SUCCINATE ER 50 MG PO TB24: 75.0000 mg | ORAL_TABLET | Freq: Every day | ORAL | Status: DC

## 2023-06-13 NOTE — Progress Notes (Unsigned)
Subjective  CC: No chief complaint on file.   HPI: Alexandra Walker is a 68 y.o. female who presents to the office today for follow up of diabetes and problems listed above in the chief complaint.  Diabetes follow up: Her diabetic control is reported as {improved/worse/unchanged:3041574}. *** She denies exertional CP or SOB or symptomatic hypoglycemia. She denies foot sores or paresthesias.  Wt Readings from Last 3 Encounters:  03/04/23 266 lb (120.7 kg)  01/06/23 260 lb (117.9 kg)  12/31/22 264 lb 6.4 oz (119.9 kg)    BP Readings from Last 3 Encounters:  03/04/23 118/84  01/06/23 116/74  12/31/22 119/60    Assessment  No diagnosis found.   Plan  Diabetes is currently {DESC; WELL/POORLY/MARGINALLY:18601} controlled. ***  Follow up: *** No orders of the defined types were placed in this encounter.  No orders of the defined types were placed in this encounter.     Immunization History  Administered Date(s) Administered   Influenza, High Dose Seasonal PF 01/21/2013   Influenza, Seasonal, Injecte, Preservative Fre 03/04/2023   Influenza,inj,Quad PF,6+ Mos 01/21/2013, 03/09/2018, 03/19/2019, 03/13/2021, 02/26/2022   Influenza,inj,quad, With Preservative 02/18/2015   Influenza-Unspecified 02/18/2015, 02/23/2016, 03/13/2017, 03/20/2020   Moderna Sars-Covid-2 Vaccination 04/20/2019   PFIZER(Purple Top)SARS-COV-2 Vaccination 05/15/2019, 06/05/2019   PNEUMOCOCCAL CONJUGATE-20 05/07/2021   Pneumococcal Polysaccharide-23 08/01/2008   Rabies Immune Globulin 10/14/2011, 10/17/2011, 10/23/2011, 10/31/2011   Rabies, IM 10/14/2011, 10/14/2011, 10/17/2011, 10/23/2011, 10/31/2011   Rabies, intradermal 10/14/2011   Tdap 06/08/2007, 11/01/2019   Zoster Recombinant(Shingrix) 11/19/2021, 05/31/2022    Diabetes Related Lab Review: Lab Results  Component Value Date   HGBA1C 6.2 03/04/2023   HGBA1C 5.7 (A) 09/02/2022   HGBA1C 5.7 (A) 05/27/2022    Lab Results  Component Value Date    MICROALBUR 0.9 03/04/2023   Lab Results  Component Value Date   CREATININE 0.76 03/04/2023   BUN 17 03/04/2023   NA 139 03/04/2023   K 4.5 03/04/2023   CL 105 03/04/2023   CO2 28 03/04/2023   Lab Results  Component Value Date   CHOL 137 03/04/2023   CHOL 144 05/27/2022   CHOL 134 09/03/2021   Lab Results  Component Value Date   HDL 41.00 03/04/2023   HDL 38.60 (L) 05/27/2022   HDL 39 (L) 09/03/2021   Lab Results  Component Value Date   LDLCALC 69 03/04/2023   LDLCALC 75 05/27/2022   LDLCALC 70 09/03/2021   Lab Results  Component Value Date   TRIG 133.0 03/04/2023   TRIG 152.0 (H) 05/27/2022   TRIG 141 09/03/2021   Lab Results  Component Value Date   CHOLHDL 3 03/04/2023   CHOLHDL 4 05/27/2022   CHOLHDL 3 11/13/2020   Lab Results  Component Value Date   LDLDIRECT 118.0 05/24/2019   The 10-year ASCVD risk score (Arnett DK, et al., 2019) is: 18.7%   Values used to calculate the score:     Age: 58 years     Sex: Female     Is Non-Hispanic African American: No     Diabetic: Yes     Tobacco smoker: No     Systolic Blood Pressure: 156 mmHg     Is BP treated: Yes     HDL Cholesterol: 41 mg/dL     Total Cholesterol: 137 mg/dL I have reviewed the PMH, Fam and Soc history. Patient Active Problem List   Diagnosis Date Noted Date Diagnosed   Controlled type 2 diabetes mellitus without complication, without long-term current use  of insulin (HCC) 05/27/2022     Priority: High   Morbid obesity (HCC) 08/07/2020     Priority: High   Attention deficit hyperactivity disorder (ADHD), combined type 10/19/2017     Priority: High   OSA on CPAP, followed by Neurology/Sleep 09/26/2016     Priority: High    Dr. Vickey Huger. Sleep test repeat: 2023, mild, continue cpap. Auto titrated    Hypertension associated with diabetes (HCC) 01/31/2016     Priority: High   Combined hyperlipidemia associated with type 2 diabetes mellitus (HCC) 05/10/2015     Priority: High    Paroxysmal SVT (supraventricular tachycardia) (HCC) 09/19/2011     Priority: High    SVT/PACs controlled on BB; managed by cardiology. No CAD    Esophageal stricture 09/02/2022     Priority: Medium     EGD 08/2022: dilated. Otherwise normal    Bilateral hip bursitis 05/06/2019     Priority: Medium    Nephrolithiasis 03/29/2018     Priority: Medium     H/o recurrent pyelo/urosepsis; s/p stone retrieval and stenting.  Has nonobstructive stone that persists.  Dr. Berneice Heinrich    Anticardiolipin syndrome [facility])      Priority: Medium    Recurrent circadian rhythm sleep disorder, shift work type 03/31/2017     Priority: Medium    IBS (irritable bowel syndrome), diarrhea predominant 03/17/2017     Priority: Medium    NAFLD (nonalcoholic fatty liver disease) 91/47/8295     Priority: Medium    Bilateral lower extremity edema 09/02/2022     Priority: Low   Diverticulosis of colon 05/16/2018     Priority: Low    Colonoscopy 08/2022: tics throughout, o/w normal.    Drug-induced pancreatitis, 2015, thought to be due to [facility] 03/29/2018     Priority: Low   History of vitreous detachment OU, HTN retinopathy OU, High myopia OU, Cataracts OU, Vitreous floaters OU, Keratoconjunctivitis sicca OU not specified as Sjogren's 03/29/2018     Priority: Low   History of sepsis 02/01/2016     Priority: Low    Due to urosepsis: had acute short lived afib/psvt. Neg cardiac eval since.     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 She has never used smokeless tobacco. She reports current alcohol use. She reports that she does not use drugs.  Review of Systems: Ophthalmic: negative for eye pain, loss of vision or double vision Cardiovascular: negative for chest pain Respiratory:  negative for SOB or persistent cough Gastrointestinal: negative for abdominal pain Genitourinary: negative for dysuria or gross hematuria MSK: negative for foot lesions Neurologic: negative for weakness or gait disturbance  Objective  Vitals: LMP 05/20/2008  General: well appearing, no acute distress  Psych:  Alert and oriented, normal mood and affect HEENT:  Normocephalic, atraumatic, moist mucous membranes, supple neck  Cardiovascular:  Nl S1 and S2, RRR without murmur, gallop or rub. no edema Respiratory:  Good breath sounds bilaterally, CTAB with normal effort, no rales Gastrointestinal: normal BS, soft, nontender Skin:  Warm, no rashes Neurologic:   Mental status is normal. normal gait Foot exam: no erythema, pallor, or cyanosis visible nl proprioception and sensation to monofilament testing bilaterally, +2 distal pulses bilaterally    Diabetic education: ongoing  education regarding chronic disease management for diabetes was given today. We continue to reinforce the ABC's of diabetic management: A1c (<7 or 8 dependent upon patient), tight blood pressure control, and cholesterol management with goal LDL < 100 minimally. We discuss diet strategies, exercise recommendations, medication options and possible side effects. At each visit, we review recommended immunizations and preventive care recommendations for diabetics and stress that good diabetic control can prevent other problems. See below for this patient's data.   Commons side effects, risks, benefits, and alternatives for medications and treatment plan prescribed today were discussed, and the patient expressed understanding of the given instructions. Patient is instructed to call or message via MyChart if he/she has any questions or concerns regarding our treatment plan. No barriers to understanding were identified. We discussed Red Flag symptoms and signs in detail. Patient expressed understanding regarding what to do in case of  urgent or emergency type symptoms.  Medication list was reconciled, printed and provided to the patient in AVS. Patient instructions and summary information was reviewed with the patient as documented in the AVS. This note was prepared with assistance of Dragon voice recognition software. Occasional wrong-word or sound-a-like substitutions may have occurred due to the inherent limitations of voice recognition software

## 2023-06-25 ENCOUNTER — Other Ambulatory Visit (HOSPITAL_BASED_OUTPATIENT_CLINIC_OR_DEPARTMENT_OTHER): Payer: Self-pay

## 2023-06-27 ENCOUNTER — Other Ambulatory Visit (HOSPITAL_BASED_OUTPATIENT_CLINIC_OR_DEPARTMENT_OTHER): Payer: Self-pay

## 2023-07-07 DIAGNOSIS — N200 Calculus of kidney: Secondary | ICD-10-CM | POA: Diagnosis not present

## 2023-07-07 DIAGNOSIS — N952 Postmenopausal atrophic vaginitis: Secondary | ICD-10-CM | POA: Diagnosis not present

## 2023-07-07 DIAGNOSIS — N3020 Other chronic cystitis without hematuria: Secondary | ICD-10-CM | POA: Diagnosis not present

## 2023-07-21 ENCOUNTER — Other Ambulatory Visit (HOSPITAL_BASED_OUTPATIENT_CLINIC_OR_DEPARTMENT_OTHER): Payer: Self-pay

## 2023-07-21 ENCOUNTER — Other Ambulatory Visit: Payer: Self-pay

## 2023-07-21 DIAGNOSIS — Z7984 Long term (current) use of oral hypoglycemic drugs: Secondary | ICD-10-CM | POA: Diagnosis not present

## 2023-07-21 DIAGNOSIS — H1789 Other corneal scars and opacities: Secondary | ICD-10-CM | POA: Diagnosis not present

## 2023-07-21 DIAGNOSIS — H5203 Hypermetropia, bilateral: Secondary | ICD-10-CM | POA: Diagnosis not present

## 2023-07-21 DIAGNOSIS — H2513 Age-related nuclear cataract, bilateral: Secondary | ICD-10-CM | POA: Diagnosis not present

## 2023-07-21 DIAGNOSIS — H16223 Keratoconjunctivitis sicca, not specified as Sjogren's, bilateral: Secondary | ICD-10-CM | POA: Diagnosis not present

## 2023-07-21 DIAGNOSIS — H40023 Open angle with borderline findings, high risk, bilateral: Secondary | ICD-10-CM | POA: Diagnosis not present

## 2023-07-21 DIAGNOSIS — H43813 Vitreous degeneration, bilateral: Secondary | ICD-10-CM | POA: Diagnosis not present

## 2023-07-21 DIAGNOSIS — H524 Presbyopia: Secondary | ICD-10-CM | POA: Diagnosis not present

## 2023-07-21 DIAGNOSIS — R7303 Prediabetes: Secondary | ICD-10-CM | POA: Diagnosis not present

## 2023-07-21 DIAGNOSIS — H52203 Unspecified astigmatism, bilateral: Secondary | ICD-10-CM | POA: Diagnosis not present

## 2023-07-21 MED ORDER — CYCLOSPORINE 0.05 % OP EMUL
1.0000 [drp] | Freq: Two times a day (BID) | OPHTHALMIC | 3 refills | Status: DC
  Filled 2023-07-21: qty 180, 90d supply, fill #0

## 2023-07-25 ENCOUNTER — Other Ambulatory Visit (HOSPITAL_BASED_OUTPATIENT_CLINIC_OR_DEPARTMENT_OTHER): Payer: Self-pay

## 2023-08-11 ENCOUNTER — Other Ambulatory Visit: Payer: Self-pay | Admitting: Family Medicine

## 2023-08-11 ENCOUNTER — Other Ambulatory Visit (HOSPITAL_BASED_OUTPATIENT_CLINIC_OR_DEPARTMENT_OTHER): Payer: Self-pay

## 2023-08-11 ENCOUNTER — Other Ambulatory Visit: Payer: Self-pay

## 2023-08-11 DIAGNOSIS — I491 Atrial premature depolarization: Secondary | ICD-10-CM

## 2023-08-11 DIAGNOSIS — I152 Hypertension secondary to endocrine disorders: Secondary | ICD-10-CM

## 2023-08-11 MED ORDER — ROSUVASTATIN CALCIUM 20 MG PO TABS
20.0000 mg | ORAL_TABLET | Freq: Every day | ORAL | 3 refills | Status: AC
  Filled 2023-08-11: qty 90, 90d supply, fill #0
  Filled 2023-09-12 – 2024-01-19 (×2): qty 90, 90d supply, fill #1
  Filled 2024-04-19: qty 90, 90d supply, fill #2

## 2023-08-12 ENCOUNTER — Other Ambulatory Visit (HOSPITAL_BASED_OUTPATIENT_CLINIC_OR_DEPARTMENT_OTHER): Payer: Self-pay

## 2023-08-12 ENCOUNTER — Encounter: Payer: Self-pay | Admitting: Family Medicine

## 2023-08-12 DIAGNOSIS — I491 Atrial premature depolarization: Secondary | ICD-10-CM

## 2023-08-12 DIAGNOSIS — E1159 Type 2 diabetes mellitus with other circulatory complications: Secondary | ICD-10-CM

## 2023-08-12 MED ORDER — METOPROLOL SUCCINATE ER 50 MG PO TB24
75.0000 mg | ORAL_TABLET | Freq: Every day | ORAL | 0 refills | Status: DC
  Filled 2023-08-12: qty 135, 90d supply, fill #0

## 2023-08-13 ENCOUNTER — Other Ambulatory Visit (HOSPITAL_BASED_OUTPATIENT_CLINIC_OR_DEPARTMENT_OTHER): Payer: Self-pay

## 2023-08-13 ENCOUNTER — Other Ambulatory Visit: Payer: Self-pay

## 2023-08-13 MED ORDER — METOPROLOL SUCCINATE ER 25 MG PO TB24
25.0000 mg | ORAL_TABLET | Freq: Every day | ORAL | 3 refills | Status: DC
  Filled 2023-08-13: qty 90, 90d supply, fill #0

## 2023-08-13 MED ORDER — METOPROLOL SUCCINATE ER 50 MG PO TB24
50.0000 mg | ORAL_TABLET | Freq: Every day | ORAL | 3 refills | Status: DC
  Filled 2023-08-13: qty 90, 90d supply, fill #0

## 2023-08-18 ENCOUNTER — Other Ambulatory Visit (HOSPITAL_BASED_OUTPATIENT_CLINIC_OR_DEPARTMENT_OTHER): Payer: Self-pay

## 2023-08-19 DIAGNOSIS — L814 Other melanin hyperpigmentation: Secondary | ICD-10-CM | POA: Diagnosis not present

## 2023-08-19 DIAGNOSIS — D1801 Hemangioma of skin and subcutaneous tissue: Secondary | ICD-10-CM | POA: Diagnosis not present

## 2023-08-19 DIAGNOSIS — L821 Other seborrheic keratosis: Secondary | ICD-10-CM | POA: Diagnosis not present

## 2023-09-08 ENCOUNTER — Ambulatory Visit: Payer: 59 | Admitting: Family Medicine

## 2023-09-11 ENCOUNTER — Encounter: Payer: Self-pay | Admitting: Family Medicine

## 2023-09-11 ENCOUNTER — Ambulatory Visit: Payer: 59 | Admitting: Family Medicine

## 2023-09-11 VITALS — BP 134/68 | HR 63 | Temp 97.8°F | Ht 66.0 in | Wt 272.0 lb

## 2023-09-11 DIAGNOSIS — E538 Deficiency of other specified B group vitamins: Secondary | ICD-10-CM

## 2023-09-11 DIAGNOSIS — E1159 Type 2 diabetes mellitus with other circulatory complications: Secondary | ICD-10-CM | POA: Diagnosis not present

## 2023-09-11 DIAGNOSIS — E559 Vitamin D deficiency, unspecified: Secondary | ICD-10-CM

## 2023-09-11 DIAGNOSIS — F902 Attention-deficit hyperactivity disorder, combined type: Secondary | ICD-10-CM

## 2023-09-11 DIAGNOSIS — Z7985 Long-term (current) use of injectable non-insulin antidiabetic drugs: Secondary | ICD-10-CM | POA: Diagnosis not present

## 2023-09-11 DIAGNOSIS — I491 Atrial premature depolarization: Secondary | ICD-10-CM | POA: Diagnosis not present

## 2023-09-11 DIAGNOSIS — E1165 Type 2 diabetes mellitus with hyperglycemia: Secondary | ICD-10-CM

## 2023-09-11 DIAGNOSIS — I152 Hypertension secondary to endocrine disorders: Secondary | ICD-10-CM | POA: Diagnosis not present

## 2023-09-11 LAB — POCT GLYCOSYLATED HEMOGLOBIN (HGB A1C): Hemoglobin A1C: 5.6 % (ref 4.0–5.6)

## 2023-09-11 LAB — BASIC METABOLIC PANEL WITH GFR
BUN: 14 mg/dL (ref 6–23)
CO2: 27 meq/L (ref 19–32)
Calcium: 9.3 mg/dL (ref 8.4–10.5)
Chloride: 102 meq/L (ref 96–112)
Creatinine, Ser: 0.68 mg/dL (ref 0.40–1.20)
GFR: 91.33 mL/min (ref >60.00)
Glucose, Bld: 100 mg/dL — ABNORMAL HIGH (ref 70–99)
Potassium: 3.8 meq/L (ref 3.5–5.1)
Sodium: 138 meq/L (ref 135–145)

## 2023-09-11 LAB — VITAMIN D 25 HYDROXY (VIT D DEFICIENCY, FRACTURES): VITD: 38.13 ng/mL (ref 30.00–100.00)

## 2023-09-11 LAB — VITAMIN B12: Vitamin B-12: 222 pg/mL (ref 211–911)

## 2023-09-11 MED ORDER — METOPROLOL SUCCINATE ER 50 MG PO TB24: 100.0000 mg | ORAL_TABLET | Freq: Every day | ORAL | Status: DC

## 2023-09-11 NOTE — Progress Notes (Signed)
 Subjective  CC:  Chief Complaint  Patient presents with   Diabetes    HPI: Alexandra Walker is a 68 y.o. female who presents to the office today for follow up of diabetes and problems listed above in the chief complaint.  Discussed the use of AI scribe software for clinical note transcription with the patient, who gave verbal consent to proceed.  History of Present Illness   Alexandra Walker is a 68 year old female with hypertension and diabetes who presents for management of her blood pressure and medication review.  Her blood pressure readings have been around 128/85 to 130s/80s. She is currently on metoprolol , losartan , and Lasix . Lasix  is ineffective unless she drinks enough water. She has previously tried hydrochlorothiazide  without success. Her heart rate is mostly in the mid to high 60s.  She has a history of high cholesterol, previously reaching 300, and has been on statins since her 30s. Despite a cardiac calcium  score of zero, she continues on Crestor  due to her diabetes and family history of significant cardiovascular disease, including her father's sudden death at 55 from atherosclerosis and her mother's four-vessel bypass at 37.  She is on Mounjaro  for diabetes management, with an A1c of 5.6. She experiences occasional vomiting, typically a couple of times a month, usually a few days after her dose. No constant nausea and describes the vomiting as spontaneous and isolated.  Her weight has not changed, which she attributes to a lack of exercise. She plans to increase physical activity with better weather. She struggles with protein intake, consuming about 50 grams daily.  She is overdue for a mammogram by three months. She previously had a cyst that required follow-up, which was managed by her gynecologist.  She takes 5000 IU of vitamin D  daily but struggles to maintain adequate levels. She has been on this dose for a long time, as lower doses were insufficient.      Wt  Readings from Last 3 Encounters:  09/11/23 272 lb (123.4 kg)  06/13/23 271 lb (122.9 kg)  03/04/23 266 lb (120.7 kg)    BP Readings from Last 3 Encounters:  09/11/23 134/68  06/13/23 138/82  03/04/23 118/84    Assessment  1. Type 2 diabetes mellitus with hyperglycemia, without long-term current use of insulin  (HCC)   2. Long-term (current) use of injectable non-insulin  antidiabetic drugs   3. Hypertension associated with diabetes (HCC)   4. Attention deficit hyperactivity disorder (ADHD), combined type   5. Morbid obesity (HCC)   6. Vitamin D  deficiency, on Vit D 2000 IU daily   7. Vitamin B12 deficiency   8. PAC (premature atrial contraction)      Plan  Assessment and Plan    Hypertension Blood pressure slightly elevated at 130s/80s. Current treatment includes metoprolol , losartan , and Lasix . Suggested increasing metoprolol  to 100 mg to improve control with minimal hypotension risk. - Increase metoprolol  to 100 mg daily. - Monitor blood pressure and heart rate for 2-3 weeks. - If tolerated, prescribe metoprolol  100 mg tablets.  Type 2 Diabetes Mellitus A1c at 5.6. Mounjaro  controls appetite but causes occasional nausea and vomiting, manageable symptoms.  Nausea and vomiting due to Mounjaro  Nausea and vomiting occur sporadically post-dose, tolerable given diabetes management benefits.  Obesity Weight stable. Plans to increase physical activity. Diet lacks protein, Mounjaro  reduces intake. Suggested creatine for muscle mass and protein intake. - Consider creatine supplementation. - Encourage increased physical activity, especially outdoors. - Monitor diet and increase protein intake through whey  protein and other sources. - consider increasing dose of mounjaro   Hyperlipidemia Cholesterol managed with Crestor . Statin therapy emphasized due to diabetes and family history despite zero cardiac calcium  score.  Vitamin D  deficiency and vit b12 deficiency On 5000 IU vitamin D   but struggles to maintain levels. Suggested checking levels to avoid toxicity and assess dosage needs. - Check vitamin D  and b12  levels for current status and dosage adjustment.        Follow up: oct  2025 for cpe Orders Placed This Encounter  Procedures   VITAMIN D  25 Hydroxy (Vit-D Deficiency, Fractures)   Vitamin B12   Basic metabolic panel with GFR   POCT HgB A1C   Meds ordered this encounter  Medications   metoprolol  succinate (TOPROL -XL) 50 MG 24 hr tablet    Sig: Take 2 tablets (100 mg total) by mouth daily.      Immunization History  Administered Date(s) Administered   Influenza, High Dose Seasonal PF 01/21/2013   Influenza, Seasonal, Injecte, Preservative Fre 03/04/2023   Influenza,inj,Quad PF,6+ Mos 01/21/2013, 03/09/2018, 03/19/2019, 03/13/2021, 02/26/2022   Influenza,inj,quad, With Preservative 02/18/2015   Influenza-Unspecified 02/18/2015, 02/23/2016, 03/13/2017, 03/20/2020   Moderna Sars-Covid-2 Vaccination 04/20/2019   PFIZER(Purple Top)SARS-COV-2 Vaccination 05/15/2019, 06/05/2019   PNEUMOCOCCAL CONJUGATE-20 05/07/2021   Pneumococcal Polysaccharide-23 08/01/2008   Rabies Immune Globulin  10/14/2011, 10/17/2011, 10/23/2011, 10/31/2011   Rabies, IM 10/14/2011, 10/14/2011, 10/17/2011, 10/23/2011, 10/31/2011   Rabies, intradermal 10/14/2011   Tdap 06/08/2007, 11/01/2019   Zoster Recombinant(Shingrix ) 11/19/2021, 05/31/2022    Diabetes Related Lab Review: Lab Results  Component Value Date   HGBA1C 5.6 09/11/2023   HGBA1C 5.7 (A) 06/13/2023   HGBA1C 6.2 03/04/2023    Lab Results  Component Value Date   MICROALBUR 0.9 03/04/2023   Lab Results  Component Value Date   CREATININE 0.76 03/04/2023   BUN 17 03/04/2023   NA 139 03/04/2023   K 4.5 03/04/2023   CL 105 03/04/2023   CO2 28 03/04/2023   Lab Results  Component Value Date   CHOL 137 03/04/2023   CHOL 144 05/27/2022   CHOL 134 09/03/2021   Lab Results  Component Value Date   HDL 41.00  03/04/2023   HDL 38.60 (L) 05/27/2022   HDL 39 (L) 09/03/2021   Lab Results  Component Value Date   LDLCALC 69 03/04/2023   LDLCALC 75 05/27/2022   LDLCALC 70 09/03/2021   Lab Results  Component Value Date   TRIG 133.0 03/04/2023   TRIG 152.0 (H) 05/27/2022   TRIG 141 09/03/2021   Lab Results  Component Value Date   CHOLHDL 3 03/04/2023   CHOLHDL 4 05/27/2022   CHOLHDL 3 11/13/2020   Lab Results  Component Value Date   LDLDIRECT 118.0 05/24/2019   The 10-year ASCVD risk score (Arnett DK, et al., 2019) is: 14.1%   Values used to calculate the score:     Age: 87 years     Sex: Female     Is Non-Hispanic African American: No     Diabetic: Yes     Tobacco smoker: No     Systolic Blood Pressure: 134 mmHg     Is BP treated: Yes     HDL Cholesterol: 41 mg/dL     Total Cholesterol: 137 mg/dL I have reviewed the PMH, Fam and Soc history. Patient Active Problem List   Diagnosis Date Noted Date Diagnosed   Controlled type 2 diabetes mellitus without complication, without long-term current use of insulin  (HCC) 05/27/2022  Priority: High   Morbid obesity (HCC) 08/07/2020     Priority: High   Attention deficit hyperactivity disorder (ADHD), combined type 10/19/2017     Priority: High   OSA on CPAP, followed by Neurology/Sleep 09/26/2016     Priority: High    Dr. Albertina Hugger. Sleep test repeat: 2023, mild, continue cpap. Auto titrated    Hypertension associated with diabetes (HCC) 01/31/2016     Priority: High   Combined hyperlipidemia associated with type 2 diabetes mellitus (HCC) 05/10/2015     Priority: High   Paroxysmal SVT (supraventricular tachycardia) (HCC) 09/19/2011     Priority: High    SVT/PACs controlled on BB; managed by cardiology. No CAD    Osteopenia after menopause 06/13/2023     Priority: Medium     DEXA 12/2022: Dr. Amiel Balder, GYN, lowest T = - 1.7 left femur; routine guidance and recheck 2 years    Esophageal stricture 09/02/2022     Priority:  Medium     EGD 08/2022: dilated. Otherwise normal    Bilateral hip bursitis 05/06/2019     Priority: Medium    Nephrolithiasis 03/29/2018     Priority: Medium     H/o recurrent pyelo/urosepsis; s/p stone retrieval and stenting.  Has nonobstructive stone that persists.  Dr. Secundino Dach    Anticardiolipin syndrome [facility])      Priority: Medium    Recurrent circadian rhythm sleep disorder, shift work type 03/31/2017     Priority: Medium    IBS (irritable bowel syndrome), diarrhea predominant 03/17/2017     Priority: Medium    NAFLD (nonalcoholic fatty liver disease) 36/64/4034     Priority: Medium    Bilateral lower extremity edema 09/02/2022     Priority: Low   Diverticulosis of colon 05/16/2018     Priority: Low    Colonoscopy 08/2022: tics throughout, o/w normal.    Drug-induced pancreatitis, 2015, thought to be due to [facility] 03/29/2018     Priority: Low   History of vitreous detachment OU, HTN retinopathy OU, High myopia OU, Cataracts OU, Vitreous floaters OU, Keratoconjunctivitis sicca OU not specified as Sjogren's 03/29/2018     Priority: Low   History of sepsis 02/01/2016     Priority: Low    Due to urosepsis: had acute short lived afib/psvt. Neg cardiac eval since.     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 She has never used smokeless tobacco. She reports current alcohol use. She reports that she does not use drugs.  Review of Systems: Ophthalmic: negative for eye pain, loss of vision or double vision Cardiovascular: negative for chest pain Respiratory: negative for SOB or persistent cough Gastrointestinal: negative for abdominal pain Genitourinary: negative for dysuria or gross hematuria MSK: negative for foot lesions Neurologic: negative for  weakness or gait disturbance  Objective  Vitals: BP 134/68   Pulse 63   Temp 97.8 F (36.6 C)   Ht 5\' 6"  (1.676 m)   Wt 272 lb (123.4 kg)   LMP 05/20/2008   SpO2 99%   BMI 43.90 kg/m  General: well appearing, no acute distress  Psych:  Alert and oriented, normal mood and affect HR normal  Diabetic education: ongoing education regarding chronic disease management for diabetes was given  today. We continue to reinforce the ABC's of diabetic management: A1c (<7 or 8 dependent upon patient), tight blood pressure control, and cholesterol management with goal LDL < 100 minimally. We discuss diet strategies, exercise recommendations, medication options and possible side effects. At each visit, we review recommended immunizations and preventive care recommendations for diabetics and stress that good diabetic control can prevent other problems. See below for this patient's data.   Commons side effects, risks, benefits, and alternatives for medications and treatment plan prescribed today were discussed, and the patient expressed understanding of the given instructions. Patient is instructed to call or message via MyChart if he/she has any questions or concerns regarding our treatment plan. No barriers to understanding were identified. We discussed Red Flag symptoms and signs in detail. Patient expressed understanding regarding what to do in case of urgent or emergency type symptoms.  Medication list was reconciled, printed and provided to the patient in AVS. Patient instructions and summary information was reviewed with the patient as documented in the AVS. This note was prepared with assistance of Dragon voice recognition software. Occasional wrong-word or sound-a-like substitutions may have occurred due to the inherent limitations of voice recognition software

## 2023-09-11 NOTE — Progress Notes (Signed)
 See mychart note Dear Alexandra Walker, Your vit D remains low normal; I would not recommend increasing your Vit D above 5000 units per day.  As well, your Vitamin B12 is very low. Please take otc Vit b12 1000mcg daily. We may need to start IM injections if we can't get it up orally.   Sincerely, Dr. Jonelle Neri

## 2023-09-11 NOTE — Patient Instructions (Signed)
 Please return in October for cpe  If you have any questions or concerns, please don't hesitate to send me a message via MyChart or call the office at [phone]. Thank you for visiting with us  today! It's our pleasure caring for you.   VISIT SUMMARY:  Today, you came in for a follow-up on your blood pressure and a review of your medications. We discussed your current health status, including your hypertension, diabetes, cholesterol, weight management, and vitamin D  levels. We also reviewed your occasional nausea and vomiting related to your diabetes medication.  YOUR PLAN:  -HYPERTENSION: Hypertension means high blood pressure. Your blood pressure readings have been slightly elevated. We will increase your metoprolol  dose to 100 mg daily and monitor your blood pressure and heart rate for the next 2-3 weeks. If you tolerate the new dose well, we will continue with it.  -TYPE 2 DIABETES MELLITUS: Type 2 Diabetes Mellitus is a condition where your body does not use insulin  properly. Your A1c is at 5.6, which is good. You are managing your diabetes with Mounjaro , which helps control your appetite but causes occasional nausea and vomiting. These symptoms are manageable.  -NAUSEA AND VOMITING DUE TO MOUNJARO : You experience occasional nausea and vomiting a few days after taking Mounjaro . These symptoms are tolerable and are a side effect of the medication, which helps manage your diabetes. Eat small meals.   -OBESITY: Obesity means having excess body fat. Your weight has been stable, and you plan to increase your physical activity. Your diet currently lacks enough protein. We suggest considering creatine supplementation and increasing your protein intake through whey protein and other sources. Also, try to be more active, especially outdoors.  -HYPERLIPIDEMIA: Hyperlipidemia means having high levels of cholesterol in your blood. Your cholesterol is managed with Crestor , and it is important to continue  this due to your diabetes and family history of heart disease.  -VITAMIN D  DEFICIENCY: Vitamin D  deficiency means you have low levels of vitamin D . You are taking 5000 IU of vitamin D  daily but still struggle to maintain adequate levels. We will check your vitamin D  levels to ensure you are taking the right dose and to avoid any potential toxicity.  INSTRUCTIONS:  Please monitor your blood pressure and heart rate for the next 2-3 weeks and report any significant changes. Schedule a follow-up appointment to check your vitamin D  levels and adjust your dosage if necessary. Also, remember to schedule your overdue mammogram.

## 2023-09-12 ENCOUNTER — Other Ambulatory Visit (HOSPITAL_BASED_OUTPATIENT_CLINIC_OR_DEPARTMENT_OTHER): Payer: Self-pay

## 2023-09-12 ENCOUNTER — Other Ambulatory Visit: Payer: Self-pay | Admitting: Family Medicine

## 2023-09-12 DIAGNOSIS — I152 Hypertension secondary to endocrine disorders: Secondary | ICD-10-CM

## 2023-09-12 DIAGNOSIS — I491 Atrial premature depolarization: Secondary | ICD-10-CM

## 2023-09-12 MED ORDER — METOPROLOL SUCCINATE ER 50 MG PO TB24: 100.0000 mg | ORAL_TABLET | Freq: Every day | ORAL | 2 refills | Status: DC

## 2023-09-28 ENCOUNTER — Other Ambulatory Visit: Payer: Self-pay | Admitting: Family Medicine

## 2023-09-29 ENCOUNTER — Other Ambulatory Visit (HOSPITAL_COMMUNITY): Payer: Self-pay

## 2023-09-29 ENCOUNTER — Other Ambulatory Visit (HOSPITAL_BASED_OUTPATIENT_CLINIC_OR_DEPARTMENT_OTHER): Payer: Self-pay

## 2023-09-29 MED ORDER — LOSARTAN POTASSIUM 100 MG PO TABS
100.0000 mg | ORAL_TABLET | Freq: Every day | ORAL | 3 refills | Status: AC
  Filled 2023-09-29 – 2023-10-22 (×3): qty 90, 90d supply, fill #0
  Filled 2024-01-19: qty 90, 90d supply, fill #1

## 2023-09-29 NOTE — Telephone Encounter (Signed)
 09/11/2023 LOV  04/03/2023  30/0 refills

## 2023-09-30 ENCOUNTER — Ambulatory Visit: Admitting: Family Medicine

## 2023-09-30 ENCOUNTER — Telehealth: Payer: Self-pay | Admitting: Family Medicine

## 2023-09-30 ENCOUNTER — Encounter: Payer: Self-pay | Admitting: Family Medicine

## 2023-09-30 VITALS — BP 144/70 | HR 61 | Temp 98.2°F | Ht 66.0 in | Wt 268.8 lb

## 2023-09-30 DIAGNOSIS — Z7985 Long-term (current) use of injectable non-insulin antidiabetic drugs: Secondary | ICD-10-CM | POA: Diagnosis not present

## 2023-09-30 DIAGNOSIS — R221 Localized swelling, mass and lump, neck: Secondary | ICD-10-CM

## 2023-09-30 DIAGNOSIS — E1159 Type 2 diabetes mellitus with other circulatory complications: Secondary | ICD-10-CM

## 2023-09-30 DIAGNOSIS — I152 Hypertension secondary to endocrine disorders: Secondary | ICD-10-CM | POA: Diagnosis not present

## 2023-09-30 MED ORDER — AMPHETAMINE-DEXTROAMPHETAMINE 10 MG PO TABS
10.0000 mg | ORAL_TABLET | Freq: Every day | ORAL | 0 refills | Status: DC | PRN
  Filled 2023-09-30: qty 30, 30d supply, fill #0

## 2023-09-30 NOTE — Progress Notes (Signed)
 Subjective  CC:  Chief Complaint  Patient presents with   nodule on neck    Nodule in the middle of her neck. Not sure how long it has been therebc she just noticed it yesterday    HPI: Alexandra Walker is a 68 y.o. female who presents to the office today to address the problems listed above in the chief complaint. Discussed the use of AI scribe software for clinical note transcription with the patient, who gave verbal consent to proceed.  History of Present Illness Alexandra Walker is a 68 year old female who presents with a new lump on her neck.  She noticed the lump yesterday, describing it as a small, pea-sized, firm, and tender mass located at the top and center of her neck, possibly near the cricoid or thyroid  cartilage. The lump is mobile and was more prominent yesterday. She self discovered it.   No significant changes in energy, heart rate, or symptoms of tachycardia. Her sleep has been disrupted due to recent travel and being post-call, which she attributes to her schedule rather than the lump.  Her blood pressure has been elevated, with readings around 140s/70s, despite taking her medication a couple of hours ago. She mentions a recent dietary intake high in salt, which may contribute to fluid retention. She also notes a medication error, having taken 75 mg instead of the prescribed 100 mg, but has corrected this by taking the full dose today and plans to adjust her pill boxes accordingly.   Assessment  1. Neck mass   2. Hypertension associated with diabetes (HCC)      Plan  Assessment and Plan Assessment & Plan Neck lump Small, firm, tender, mobile lump at thyroid  cartilage. Differential includes thyroglossal duct cyst or small cystic lesion. Atypical for thyroid  mass. Normal TSH levels in October. - Order neck ultrasound to evaluate lump and assess cystic characteristics. - Consider CT scan of neck in 3-4 weeks if ultrasound inconclusive and lump  persists.  Hypertension Elevated blood pressure due to incorrect medication dosage and high sodium intake. Corrected medication dosage to 100 mg. - Ensure correct antihypertensive dosage, adjust pill boxes. - Monitor blood pressure regularly to assess response to medication adjustment.    Follow up: as scheduled in October for cpe and chronic problem f/u  Orders Placed This Encounter  Procedures   US  THYROID    No orders of the defined types were placed in this encounter.    I reviewed the patients updated PMH, FH, and SocHx.  Patient Active Problem List   Diagnosis Date Noted   Controlled type 2 diabetes mellitus without complication, without long-term current use of insulin  (HCC) 05/27/2022    Priority: High   Morbid obesity (HCC) 08/07/2020    Priority: High   Attention deficit hyperactivity disorder (ADHD), combined type 10/19/2017    Priority: High   OSA on CPAP, followed by Neurology/Sleep 09/26/2016    Priority: High   Hypertension associated with diabetes (HCC) 01/31/2016    Priority: High   Combined hyperlipidemia associated with type 2 diabetes mellitus (HCC) 05/10/2015    Priority: High   Paroxysmal SVT (supraventricular tachycardia) (HCC) 09/19/2011    Priority: High   Osteopenia after menopause 06/13/2023    Priority: Medium    Esophageal stricture 09/02/2022    Priority: Medium    Bilateral hip bursitis 05/06/2019    Priority: Medium    Nephrolithiasis 03/29/2018    Priority: Medium    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HCC)  Priority: Medium    Recurrent circadian rhythm sleep disorder, shift work type 03/31/2017    Priority: Medium    IBS (irritable bowel syndrome), diarrhea predominant 03/17/2017    Priority: Medium    NAFLD (nonalcoholic fatty liver disease) 16/02/9603    Priority: Medium    Bilateral lower extremity edema 09/02/2022    Priority: Low   Diverticulosis of colon 05/16/2018    Priority: Low   Drug-induced pancreatitis, 2015, thought to be  due to 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   amphetamine -dextroamphetamine  (ADDERALL) 10 MG tablet Take 1 tablet (10 mg total) by mouth daily as needed.   aspirin  325 MG tablet Take 325 mg by mouth daily.   cholecalciferol (VITAMIN D ) 1000 UNITS tablet Take 2,000 Units by mouth daily.    cycloSPORINE  (RESTASIS ) 0.05 % ophthalmic emulsion Place 1 drop into both eyes 2 (two) times daily.   estradiol  (ESTRACE ) 0.1 MG/GM vaginal cream Place 1 gram vaginally 2 (two) times a week.   fluticasone  (FLONASE ) 50 MCG/ACT nasal spray Place 1 spray into both nostrils in the morning and at bedtime.   furosemide  (LASIX ) 40 MG tablet Take 1 tablet (40 mg total) by mouth daily.   losartan  (COZAAR ) 100 MG tablet Take 1 tablet (100 mg total) by mouth daily.   Lutein 6 MG CAPS Take 6 mg by mouth daily.   metoprolol  succinate (TOPROL -XL) 50 MG 24 hr tablet Take 2 tablets (100 mg total) by mouth daily.   mometasone  (ELOCON ) 0.1 % cream Apply 1 application topically 2 (two) times a week.   pimecrolimus  (ELIDEL ) 1 % cream Apply twice a day to eye brow area as needed for irritation   potassium chloride  SA (KLOR-CON  M) 20 MEQ tablet Take 1 tablet (20 mEq total) by mouth daily.   rosuvastatin  (CRESTOR ) 20 MG tablet Take 1 tablet (20 mg total) by mouth daily.   tirzepatide  (MOUNJARO ) 10 MG/0.5ML Pen Inject 10 mg into the skin once a week.   Turmeric 450 MG CAPS Take 450 mg  by mouth daily.    [DISCONTINUED] cycloSPORINE  (RESTASIS ) 0.05 % ophthalmic emulsion Place 1 drop into both eyes 2 times daily.   Allergies: Patient is allergic to ace inhibitors, dust mite extract, oxycodone  hcl, oxycodone -acetaminophen , tree extract, vicodin [hydrocodone-acetaminophen ], victoza [liraglutide], and adhesive [tape]. Family History: Patient family history includes Coronary artery disease in her mother; Depression in her mother; Diabetes in her mother; Heart attack in her father and mother; Heart disease in her father and mother; Hyperlipidemia in her mother; Hypertension in her brother, mother, and sister; Kidney disease in her mother; Obesity in her mother; Sleep apnea in her mother; Sudden death in her father. Social History:  Patient  reports that she has never smoked. She has never used smokeless tobacco. She reports current alcohol use. She reports that she does not use drugs.  Review of Systems: Constitutional: Negative for fever malaise or anorexia Cardiovascular: negative  for chest pain Respiratory: negative for SOB or persistent cough Gastrointestinal: negative for abdominal pain  Objective  Vitals: BP (!) 144/70   Pulse 61   Temp 98.2 F (36.8 C)   Ht 5\' 6"  (1.676 m)   Wt 268 lb 12.8 oz (121.9 kg)   LMP 05/20/2008   SpO2 98%   BMI 43.39 kg/m  General: no acute distress , A&Ox3 HEENT: midline neck pea sized mobile minimally tender mass just above laryngeal prominence of thyroid  cartilage. Thyroid  is normal w/o mass or ttp. No cervical LAD  Commons side effects, risks, benefits, and alternatives for medications and treatment plan prescribed today were discussed, and the patient expressed understanding of the given instructions. Patient is instructed to call or message via MyChart if he/she has any questions or concerns regarding our treatment plan. No barriers to understanding were identified. We discussed Red Flag symptoms and signs in detail. Patient expressed  understanding regarding what to do in case of urgent or emergency type symptoms.  Medication list was reconciled, printed and provided to the patient in AVS. Patient instructions and summary information was reviewed with the patient as documented in the AVS. This note was prepared with assistance of Dragon voice recognition software. Occasional wrong-word or sound-a-like substitutions may have occurred due to the inherent limitations of voice recognition software

## 2023-09-30 NOTE — Telephone Encounter (Signed)
 Spoke with pt regarding B12 injections and she was ok with getting it rechecked in Oct. And she already has some b12 that she is taking.

## 2023-09-30 NOTE — Telephone Encounter (Signed)
 Patient requesting to know when she will need to return for a B12 check . Please advise .

## 2023-09-30 NOTE — Patient Instructions (Addendum)
 Please follow up as scheduled for your next visit with me: 03/05/2024   If you have any questions or concerns, please don't hesitate to send me a message via MyChart or call the office at [phone]. Thank you for visiting with us  today! It's our pleasure caring for you.   We will call you to get an ultrasound of your neck.

## 2023-10-01 ENCOUNTER — Ambulatory Visit
Admission: RE | Admit: 2023-10-01 | Discharge: 2023-10-01 | Disposition: A | Discharge status: Home / Self Care | Source: Ambulatory Visit | Attending: Family Medicine | Admitting: Family Medicine

## 2023-10-01 ENCOUNTER — Encounter: Payer: Self-pay | Admitting: Family Medicine

## 2023-10-01 ENCOUNTER — Other Ambulatory Visit: Payer: Self-pay

## 2023-10-01 ENCOUNTER — Other Ambulatory Visit (HOSPITAL_BASED_OUTPATIENT_CLINIC_OR_DEPARTMENT_OTHER): Payer: Self-pay

## 2023-10-01 DIAGNOSIS — R221 Localized swelling, mass and lump, neck: Secondary | ICD-10-CM

## 2023-10-01 DIAGNOSIS — E042 Nontoxic multinodular goiter: Secondary | ICD-10-CM | POA: Diagnosis not present

## 2023-10-03 ENCOUNTER — Other Ambulatory Visit (HOSPITAL_BASED_OUTPATIENT_CLINIC_OR_DEPARTMENT_OTHER): Payer: Self-pay

## 2023-10-06 ENCOUNTER — Encounter: Payer: Self-pay | Admitting: Neurology

## 2023-10-06 ENCOUNTER — Ambulatory Visit: Payer: 59 | Admitting: Neurology

## 2023-10-06 VITALS — BP 138/98 | HR 64 | Ht 66.0 in | Wt 269.4 lb

## 2023-10-06 DIAGNOSIS — Q159 Congenital malformation of eye, unspecified: Secondary | ICD-10-CM | POA: Diagnosis not present

## 2023-10-06 DIAGNOSIS — G4763 Sleep related bruxism: Secondary | ICD-10-CM | POA: Diagnosis not present

## 2023-10-06 DIAGNOSIS — G4733 Obstructive sleep apnea (adult) (pediatric): Secondary | ICD-10-CM

## 2023-10-06 DIAGNOSIS — G4726 Circadian rhythm sleep disorder, shift work type: Secondary | ICD-10-CM

## 2023-10-06 DIAGNOSIS — I471 Supraventricular tachycardia, unspecified: Secondary | ICD-10-CM

## 2023-10-06 DIAGNOSIS — E119 Type 2 diabetes mellitus without complications: Secondary | ICD-10-CM

## 2023-10-06 NOTE — Patient Instructions (Signed)
   ASSESSMENT AND PLAN :   68 y.o. year old female here with:  OSA, her main risk factor is obesity.  Fatigue can be related to thyroid  function, B 12 def and or mood disorder.     1) Recently dx B 12 deficiency, now supplementing oral but not yet seeing any benefit in energy or sleepiness.   2) OSA on CPAP, good compliance.  Shift work sleep schedule   3) Mounjaro  has helped the HbA1c but she reports she isn't losing weight.   May qualify for ZEPOSIA, based on dx of  OSA and BMI ?   Rv in 1 months.     I would like to thank  Luevenia Saha, Md [address] ,  [place] [address] for allowing me to meet with and to take care of this pleasant patient.

## 2023-10-06 NOTE — Progress Notes (Signed)
 Provider:  Neomia Banner, MD  Primary Care Physician:  Luevenia Saha, MD [address] [place] [address]     Referring Provider: Luevenia Saha, Md [address] [place],  [place] [address]          Chief Complaint according to patient   Patient presents with:                HISTORY OF PRESENT ILLNESS:  Alexandra Walker is a 68 y.o. female patient who is here for revisit 10/06/2023 for  CPAP compliance. She is a Education officer, museum, Statistician. Had OSA on CPAP, obesity, thyroid  nodules DM 2.  Hba1c  on mounjaro   is 5.9. She has not lost weight.    Chief concern according to patient :  None   CPAP os working well  I did not see the CPAP data scanned in and therefor will quote them here.  100% compliance by days and 77% compliance by hours,   Auto set CPAP 4-20 cm water, 95% pressure is  7 cm water and her nasal mask seals well, air leak at 0.9 L/m.   She wears a CPAP and a mouth guard, made to measure by her sleep dentist.    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 Has HTN, DM, Obesity. HST from 06-25-2022 showed no hypoxia, Calculated pAHI (per hour): 13.7/h during REM sleep 20.9/h and in non-REM sleep 10.2/h. NEW CPAP machine was issued , here for first follow up.                       Alexandra Walker has been a compliant CPAP user and has used the machine 29 out of 30 days and this 30 days period from April 17 through May 16.  The average user time is 6 hours 1 minutes.  Her AutoSet is an AirSense 11 machine with a minimum pressure of 6 and maximum pressure of 14 and 2 cm water pressure relief on expiration.  The residual AHI is only 0.2/h which is an excellent control there are no central apneas arising.  The 95th percentile pressure is 7.6 cm water. On Mounjaro  now, not yet noting any weight loss.  The patient had an ED visit related to palpitations that turned out to be  supraventricular tachycardia, she had been retaining fluids and had developed ankle edema.  She was placed on the diuretic furosemide  to be used daily for now.      04-09-2021, RV for my established sleep patient, Alexandra Walker.  She contracted Covid in July and was feeling pretty back to baseline within less than a week. She is here because her CPAP machine is going to be 66 years old now and will be replaced, I will order a new HST baseline.  She was doing well at 8 cm water, 2 cm EPR. Her daughter is moving as an [place] , she is looking forward to an empty nest      08-07-2020, Interval history for Alexandra Walker, Nurse Midwife, She has the usual lower compliance based on her on-call history-  She is not sleepy while working nights.  Has a cumulative sleep deficit at the week end , making up by sleeping `12 hours off call- live style is a 4 day weekend.  I  have the pleasure of also see that her CPAP which is still set at 8 cmH2O with 3 cm EPR has completely resolved her apnea her residual AHI is again excellent at 0.2/h.  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      Review of Systems: Out of a complete 14 system review, the patient complains of only the following symptoms, and all other reviewed systems are negative.:   GDS 3/ 15  Epworth SS :  4/ 24 FSS: 35/ 63.   Social History   Socioeconomic History   Marital status: Divorced    Spouse name: Not on file   Number of children: 1   Years of education: Not on file   Highest education level: Master's degree (e.g., MA, MS, MEng, MEd, MSW, MBA)  Occupational History   Occupation: [employer] Midwife    Employer: [employer]  Tobacco Use   Smoking status: Never   Smokeless tobacco: Never  Vaping Use   Vaping status: Never Used  Substance and Sexual  Activity   Alcohol use: Yes    Alcohol/week: 0.0 standard drinks of alcohol    Comment: social   Drug use: No   Sexual activity: Yes    Birth control/protection: Surgical  Other Topics Concern   Not on file  Social History Narrative   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   Social Drivers of Health   Financial Resource Strain: Low Risk  (06/13/2023)   Overall Financial Resource Strain (CARDIA)    Difficulty of Paying Living Expenses: Not hard at all  Food Insecurity: No Food Insecurity (06/13/2023)   Hunger Vital Sign    Worried About Running Out of Food in the Last Year: Never true    Ran Out of Food in the Last Year: Never true  Transportation Needs: No Transportation Needs (06/13/2023)   PRAPARE - Administrator, Civil Service (Medical): No    Lack of Transportation (Non-Medical): No  Physical Activity: Insufficiently Active (06/13/2023)   Exercise Vital Sign    Days of Exercise per Week: 1 day    Minutes of Exercise per Session: 30 min  Stress: No Stress Concern Present (06/13/2023)   [employer] of Occupational Health - Occupational Stress Questionnaire    Feeling of Stress : Only a little  Social Connections: Moderately Integrated (06/13/2023)   Social Connection and Isolation Panel [NHANES]    Frequency of Communication with Friends and Family: Twice a week    Frequency of Social Gatherings with Friends and Family: Once a week    Attends Religious Services: More than 4 times per year    Active Member of [employer] or Organizations: Yes    Attends Engineer, structural: More than 4 times per year    Marital Status: Divorced    Family History  Problem Relation Age of Onset   Coronary artery disease Mother    Hypertension Mother    Heart disease Mother    Heart attack Mother    Diabetes Mother    Hyperlipidemia Mother    Kidney disease Mother    Depression Mother    Sleep apnea Mother    Obesity Mother    Heart attack Father     Heart disease Father    Sudden death Father    Hypertension Sister  Hypertension Brother    Colon cancer Neg Hx    Colon polyps Neg Hx    Esophageal cancer Neg Hx    Rectal cancer Neg Hx    Stomach cancer Neg Hx     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   Diabetes (HCC)    Diverticulosis    Minimal   Fatty liver    GERD (gastroesophageal reflux disease)    Heart murmur    Hepatic steatosis    History of hiatal hernia 2017   Small   HLA B27 (HLA B27 positive)    Hx of pancreatitis    Hyperlipidemia    Hypertension    IBS (irritable bowel syndrome)    Ischemic colitis (HCC)    Lactose intolerance    Lumbar scoliosis 2010   Mild   Migraine headache without aura 05/10/2015   Multiple thyroid  nodules    Nephrolithiasis 2014   Left, minimal   Obesity due to excess calories with serious comorbidity 05/24/2019   Palpitations    Prediabetes    Presbyopia    Right bundle branch block    Sepsis (HCC) 01/2016   Sleep apnea    Sleep hypopnea    Vitamin D  deficiency     Past Surgical History:  Procedure Laterality Date   BREAST BIOPSY Left    CERVICAL CONE BIOPSY     CHOLECYSTECTOMY N/A 10/26/2014   Procedure: LAPAROSCOPIC CHOLECYSTECTOMY;  Surgeon: Oza Blumenthal, MD;  Location: [facility];  Service: General;  Laterality: N/A;   COLONOSCOPY W/ BIOPSIES  multiple   CYSTOSCOPY WITH RETROGRADE PYELOGRAM, URETEROSCOPY AND STENT PLACEMENT Left 06/26/2018   Procedure: CYSTOSCOPY WITH RETROGRADE PYELOGRAM, URETEROSCOPY AND STENT PLACEMENT;  Surgeon: Osborn Blaze, MD;  Location: [facility];  Service: Urology;  Laterality: Left;   DIAGNOSTIC LAPAROSCOPY     DILATION AND CURETTAGE OF UTERUS  11/2009   DUPUYTREN CONTRACTURE RELEASE Left    x 3 , Dr Aloha Arnold   ESOPHAGOGASTRODUODENOSCOPY  multiple   EYE SURGERY     HOLMIUM LASER APPLICATION Left 06/26/2018   Procedure: HOLMIUM LASER APPLICATION;  Surgeon: Osborn Blaze, MD;  Location: [facility];  Service: Urology;  Laterality: Left;   KNEE ARTHROSCOPY Left 11/2008   LAPAROSCOPIC VAGINAL HYSTERECTOMY WITH SALPINGO OOPHORECTOMY Bilateral 10/03/2015   Procedure: LAPAROSCOPIC ASSISTED VAGINAL HYSTERECTOMY WITH SALPINGECTOMY;  Surgeon: Stevenson Elbe, MD;  Location: [facility];  Service: Gynecology;  Laterality: Bilateral;   LASIK  1990   RK   LEFT FINGER SURGERY     LIVER BIOPSY     MASS EXCISION Left 12/24/2012   Procedure: LEFT EXCISION OF PALMAR MASS X TWO;  Surgeon: Ronn Cohn, MD;  Location: [facility];  Service: Orthopedics;  Laterality: Left;   PHOTOREFRACTIVE KERATOTOMY  1994   RADIAL KERATOTOMY  1994   TRANSOBTURATOR SLING       Current Outpatient Medications on File Prior to Visit  Medication Sig Dispense Refill   amphetamine -dextroamphetamine  (ADDERALL) 10 MG tablet Take 1 tablet (10 mg total) by  mouth daily as needed. (Patient taking differently: Take 5 mg by mouth daily as needed.) 30 tablet 0   aspirin  325 MG tablet Take 325 mg by mouth daily.     cholecalciferol (VITAMIN D ) 1000 UNITS tablet Take 2,000 Units by mouth daily.      cyanocobalamin  (VITAMIN B12) 1000 MCG tablet Take 1,000 mcg by mouth daily.     cycloSPORINE  (RESTASIS ) 0.05 % ophthalmic emulsion Place 1 drop into both eyes 2 (two) times daily. 180 each 3   estradiol  (ESTRACE ) 0.1 MG/GM vaginal cream Place 1 gram vaginally 2 (two) times a week. 42.5 g 1   fluticasone  (FLONASE ) 50 MCG/ACT nasal spray Place 1 spray into both nostrils in the morning and at bedtime. 16 g 6    furosemide  (LASIX ) 40 MG tablet Take 1 tablet (40 mg total) by mouth daily. (Patient taking differently: Take 40 mg by mouth daily as needed.) 90 tablet 1   losartan  (COZAAR ) 100 MG tablet Take 1 tablet (100 mg total) by mouth daily. 90 tablet 3   Lutein 6 MG CAPS Take 6 mg by mouth daily.     metoprolol  succinate (TOPROL -XL) 50 MG 24 hr tablet Take 2 tablets (100 mg total) by mouth daily. 180 tablet 2   mometasone  (ELOCON ) 0.1 % cream Apply 1 application topically 2 (two) times a week. 45 g 1   potassium chloride  SA (KLOR-CON  M) 20 MEQ tablet Take 1 tablet (20 mEq total) by mouth daily. 90 tablet 1   rosuvastatin  (CRESTOR ) 20 MG tablet Take 1 tablet (20 mg total) by mouth daily. 90 tablet 3   tirzepatide  (MOUNJARO ) 10 MG/0.5ML Pen Inject 10 mg into the skin once a week. 2 mL 5   Turmeric 450 MG CAPS Take 450 mg by mouth daily.      No current facility-administered medications on file prior to visit.    Allergies  Allergen Reactions   Ace Inhibitors Other (See Comments)    coughing   Dust Mite Extract Other (See Comments)    Sneezing and congestion   Oxycodone  Hcl Other (See Comments) and Nausea And Vomiting    NDC [id number]   Oxycodone -Acetaminophen  Nausea And Vomiting   Tree Extract Other (See Comments)    Tree pollen and scrub pollen    Vicodin [Hydrocodone-Acetaminophen ] Nausea And Vomiting   Victoza [Liraglutide] Other (See Comments)    pancreatitis   Adhesive [Tape] Rash    Not on stomach     DIAGNOSTIC DATA (LABS, IMAGING, TESTING) - I reviewed patient records, labs, notes, testing and imaging myself where available.  Lab Results  Component Value Date   WBC 6.9 03/04/2023   HGB 14.1 03/04/2023   HCT 44.1 03/04/2023   MCV 83.9 03/04/2023   PLT 265.0 03/04/2023      Component Value Date/Time   NA 138 09/11/2023 0939   NA 142 09/03/2021 1219   K 3.8 09/11/2023 0939   CL 102 09/11/2023 0939   CO2 27 09/11/2023 0939   GLUCOSE 100 (H) 09/11/2023 0939   BUN  14 09/11/2023 0939   BUN 21 09/03/2021 1219   CREATININE 0.68 09/11/2023 0939   CREATININE 0.76 05/05/2020 1631   CALCIUM  9.3 09/11/2023 0939   PROT 6.5 03/04/2023 1019   PROT 6.3 09/03/2021 1219   ALBUMIN 4.0 03/04/2023 1019   ALBUMIN 4.1 09/03/2021 1219   AST 12 03/04/2023 1019   ALT 13 03/04/2023 1019   ALKPHOS 95 03/04/2023 1019   BILITOT 0.6 03/04/2023 1019   BILITOT  0.4 09/03/2021 1219   GFRNONAA >60 07/09/2022 1940   GFRAA >60 04/20/2018 1323   Lab Results  Component Value Date   CHOL 137 03/04/2023   HDL 41.00 03/04/2023   LDLCALC 69 03/04/2023   LDLDIRECT 118.0 05/24/2019   TRIG 133.0 03/04/2023   CHOLHDL 3 03/04/2023   Lab Results  Component Value Date   HGBA1C 5.6 09/11/2023   Lab Results  Component Value Date   VITAMINB12 222 09/11/2023   Lab Results  Component Value Date   TSH 2.50 03/04/2023    PHYSICAL EXAM:  Today's Vitals   10/06/23 1443  BP: (!) 138/98  Pulse: 64  Weight: 269 lb 6.4 oz (122.2 kg)  Height: 5\' 6"  (1.676 m)   Body mass index is 43.48 kg/m.   Wt Readings from Last 3 Encounters:  10/06/23 269 lb 6.4 oz (122.2 kg)  09/30/23 268 lb 12.8 oz (121.9 kg)  09/11/23 272 lb (123.4 kg)     Ht Readings from Last 3 Encounters:  10/06/23 5\' 6"  (1.676 m)  09/30/23 5\' 6"  (1.676 m)  09/11/23 5\' 6"  (1.676 m)      General: The patient is awake, alert and appears not in acute distress. The patient is well groomed. BMI  43. 5 today.  Head: Normocephalic, atraumatic. Neck is supple. Mallampati 4  neck circumference: 15.25 . Nasal airflow restricted, TMJ is evident. Retrognathia is not seen.  Bruxism marks !   Still wears mouth guard, which fits unde nasal pillows/ nasal  ask.  Cardiovascular:  Regular rate and rhythm, 53 bpm.  Respiratory: Lungs are clear to auscultation. Skin:  Ankle edema, moderate.Trunk: BMI is 38.9 kg/m2.  The patient's posture is erect-    Neurologic exam :The patient is awake and alert, oriented to place and  time.  Mood and affect are appropriate, she is happy.   Cranial nerves:Pupils are equal and briskly reactive to light and accomodation.   Extraocular movements  in vertical and horizontal planes intact and without nystagmus. Visual fields by finger perimetry are intact.Hearing to finger rub intact. Facial sensation intact to fine touch. Facial motor strength is symmetric and tongue and uvula move midline. Tongue protrusion into cheeks is strong.  Shoulder shrug was symmetrical.  Motor exam:  Symmetric bulk, tone and ROM.   Normal tone without cog wheeling, symmetric grip strength .   Sensory:  intact Proprioception tested in the upper extremities was normal. Deep tendon reflexes: in the  upper and lower extremities are symmetric and intact.      ASSESSMENT AND PLAN :   68 y.o. year old female here with:  OSA, main risk factor is obesity.  Fatigue can be related to thyroid  function, B 12 def and or mood disorder.     1) Recently dx B 12 deficiency, now supplementing oral but not yet seeing any benefit in energy or sleepiness. Aaron Aas   2) OSA on CPAP, good compliance.  Shift work sleep schedule   3) Mounjaro  has helped the HbA1c but she reports she isn't losing weight.   May qualify for ZEPOSIA, based on dx of  OSA and BMI ?    Rv in 12 months with new CPAP data.    I would like to thank  Luevenia Saha, Md [address] [place],  [place] [address] for allowing me to meet with and to take care of this pleasant patient.    After spending a total time of  25  minutes face to face and  additional time for physical and neurologic examination, review of laboratory studies,  personal review of imaging studies, reports and results of other testing and review of referral information / records as far as provided in visit,   Electronically signed by: Neomia Banner, MD 10/06/2023 3:06 PM  [facility] Board certified by The [employer] of Sleep  Medicine and Diplomate of the [employer] of Sleep Medicine. Board certified In Neurology through the ABPN, Fellow of the [employer] of Neurology.

## 2023-10-07 ENCOUNTER — Ambulatory Visit: Payer: Self-pay | Admitting: Family Medicine

## 2023-10-07 ENCOUNTER — Encounter: Payer: Self-pay | Admitting: Family Medicine

## 2023-10-07 DIAGNOSIS — E041 Nontoxic single thyroid nodule: Secondary | ICD-10-CM

## 2023-10-07 NOTE — Progress Notes (Signed)
 See mychart note Dear Ms. Broadus Canes, Sorry for the late reply: as you know, your ultrasound showed a possible small lipoma in the area of concern. This is a benign finding and nothing further is needed.  But also you have 2 larger thyroid  nodules; a FNA is recommended and I will place a referral for you. Would you prefer to see an endocrinologist for this procedure or interventional radiology? Let me know and I can order it.  Sincerely, Dr. Jonelle Neri

## 2023-10-16 ENCOUNTER — Other Ambulatory Visit (HOSPITAL_BASED_OUTPATIENT_CLINIC_OR_DEPARTMENT_OTHER): Payer: Self-pay

## 2023-10-16 ENCOUNTER — Other Ambulatory Visit: Payer: Self-pay | Admitting: Family Medicine

## 2023-10-16 DIAGNOSIS — I491 Atrial premature depolarization: Secondary | ICD-10-CM

## 2023-10-16 DIAGNOSIS — I152 Hypertension secondary to endocrine disorders: Secondary | ICD-10-CM

## 2023-10-17 ENCOUNTER — Other Ambulatory Visit (HOSPITAL_BASED_OUTPATIENT_CLINIC_OR_DEPARTMENT_OTHER): Payer: Self-pay

## 2023-10-20 ENCOUNTER — Encounter: Payer: Self-pay | Admitting: Family Medicine

## 2023-10-20 ENCOUNTER — Other Ambulatory Visit (HOSPITAL_BASED_OUTPATIENT_CLINIC_OR_DEPARTMENT_OTHER): Payer: Self-pay

## 2023-10-20 ENCOUNTER — Other Ambulatory Visit: Payer: Self-pay

## 2023-10-20 ENCOUNTER — Telehealth: Payer: Self-pay

## 2023-10-20 MED ORDER — METOPROLOL SUCCINATE ER 100 MG PO TB24
100.0000 mg | ORAL_TABLET | Freq: Every day | ORAL | 2 refills | Status: AC
  Filled 2023-10-20: qty 90, 90d supply, fill #0
  Filled 2024-01-19: qty 90, 90d supply, fill #1
  Filled 2024-04-19: qty 90, 90d supply, fill #2

## 2023-10-20 NOTE — Telephone Encounter (Signed)
 I have spoken with pt and given her the information she needed to F/U with the endocrinology referral. # was provider [phone]

## 2023-10-22 ENCOUNTER — Other Ambulatory Visit (HOSPITAL_BASED_OUTPATIENT_CLINIC_OR_DEPARTMENT_OTHER): Payer: Self-pay

## 2023-10-24 ENCOUNTER — Telehealth: Payer: Self-pay

## 2023-10-24 ENCOUNTER — Other Ambulatory Visit: Payer: Self-pay | Admitting: Family Medicine

## 2023-10-24 ENCOUNTER — Other Ambulatory Visit (HOSPITAL_BASED_OUTPATIENT_CLINIC_OR_DEPARTMENT_OTHER): Payer: Self-pay

## 2023-10-24 ENCOUNTER — Encounter: Payer: Self-pay | Admitting: Family Medicine

## 2023-10-24 DIAGNOSIS — Z1231 Encounter for screening mammogram for malignant neoplasm of breast: Secondary | ICD-10-CM

## 2023-10-24 DIAGNOSIS — E041 Nontoxic single thyroid nodule: Secondary | ICD-10-CM

## 2023-10-24 NOTE — Telephone Encounter (Signed)
 Copied from CRM [id number]. Topic: Clinical - Request for Lab/Test Order >> Oct 24, 2023 10:58 AM Marlan Silva wrote: Reason for CRM: Patient called in requesting a lab order be placed for Thyroid  Biopsy with Affirma Dri @ [facility] (same place patient had ultra sound done prior). Patient can be reached be at [phone] once the order is placed so she knows its okay to schedule her appointment.  Please Advise.  Message has been sent to provider to address

## 2023-10-27 ENCOUNTER — Encounter (HOSPITAL_BASED_OUTPATIENT_CLINIC_OR_DEPARTMENT_OTHER): Payer: Self-pay | Admitting: Obstetrics & Gynecology

## 2023-10-27 ENCOUNTER — Other Ambulatory Visit (HOSPITAL_BASED_OUTPATIENT_CLINIC_OR_DEPARTMENT_OTHER): Payer: Self-pay

## 2023-10-27 ENCOUNTER — Ambulatory Visit (INDEPENDENT_AMBULATORY_CARE_PROVIDER_SITE_OTHER): Admitting: Obstetrics & Gynecology

## 2023-10-27 VITALS — BP 145/77 | HR 70 | Ht 66.0 in | Wt 266.0 lb

## 2023-10-27 DIAGNOSIS — N3000 Acute cystitis without hematuria: Secondary | ICD-10-CM | POA: Diagnosis not present

## 2023-10-27 DIAGNOSIS — L723 Sebaceous cyst: Secondary | ICD-10-CM | POA: Diagnosis not present

## 2023-10-27 DIAGNOSIS — N9489 Other specified conditions associated with female genital organs and menstrual cycle: Secondary | ICD-10-CM | POA: Diagnosis not present

## 2023-10-27 DIAGNOSIS — E538 Deficiency of other specified B group vitamins: Secondary | ICD-10-CM | POA: Diagnosis not present

## 2023-10-27 DIAGNOSIS — E041 Nontoxic single thyroid nodule: Secondary | ICD-10-CM

## 2023-10-27 LAB — POCT URINALYSIS DIPSTICK
Bilirubin, UA: NEGATIVE
Glucose, UA: NEGATIVE
Ketones, UA: NEGATIVE
Leukocytes, UA: NEGATIVE
Nitrite, UA: POSITIVE
Protein, UA: POSITIVE — AB
Spec Grav, UA: 1.025 (ref 1.010–1.025)
Urobilinogen, UA: 0.2 U/dL
pH, UA: 5.5 (ref 5.0–8.0)

## 2023-10-27 MED ORDER — SULFAMETHOXAZOLE-TRIMETHOPRIM 800-160 MG PO TABS
1.0000 | ORAL_TABLET | Freq: Two times a day (BID) | ORAL | 0 refills | Status: DC
  Filled 2023-10-27: qty 10, 5d supply, fill #0

## 2023-10-27 MED ORDER — FLUCONAZOLE 150 MG PO TABS
150.0000 mg | ORAL_TABLET | ORAL | 1 refills | Status: AC
  Filled 2023-10-27: qty 2, 2d supply, fill #0

## 2023-10-27 NOTE — Progress Notes (Signed)
 GYNECOLOGY  VISIT  CC:   vulvar lesion, other quesitons  HPI: 68 y.o. G21P0010 Divorced White or Caucasian female here for concerns about a vulvar lesion.  Feels rough to her and she is not sure if this needs to be removed.  No bleeding.  No itching.  Does have some vaginal atrophic changes and using vaginal topical estradiol  cream intermittently.   Unrelated, had recent thyroid  ultrasound.  Had a TiRADS 4 nodule.  Biopsy recommended.  Has not be scheduled.  Discussed seeing General Surgery first and then having biopsy so she is established if there is any significant abnormality.    Needs some blood work today.  Needs B12 and TSH with panel.    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   Diabetes (HCC)    Diverticulosis    Minimal   Fatty liver    GERD (gastroesophageal reflux disease)    Heart murmur    Hepatic steatosis    History of hiatal hernia 2017   Small   HLA B27 (HLA B27 positive)    Hx of pancreatitis    Hyperlipidemia    Hypertension    IBS (irritable bowel syndrome)    Ischemic colitis (HCC)    Lactose intolerance    Lumbar scoliosis 2010   Mild   Migraine headache without aura 05/10/2015   Multiple thyroid  nodules    Nephrolithiasis 2014   Left, minimal   Obesity due to excess calories with serious comorbidity 05/24/2019   Palpitations    Prediabetes    Presbyopia    Right bundle branch block    Sepsis (HCC) 01/2016   Sleep apnea    Sleep hypopnea    Vitamin D  deficiency     MEDS:   Current Outpatient Medications on File Prior to Visit  Medication Sig  Dispense Refill   amphetamine -dextroamphetamine  (ADDERALL) 10 MG tablet Take 1 tablet (10 mg total) by mouth daily as needed. (Patient taking differently: Take 5 mg by mouth daily as needed.) 30 tablet 0   aspirin  325 MG tablet Take 325 mg by mouth daily.     cholecalciferol (VITAMIN D ) 1000 UNITS tablet Take 2,000 Units by mouth daily.      cyanocobalamin  (VITAMIN B12) 1000 MCG tablet Take 1,000 mcg by mouth daily.     cycloSPORINE  (RESTASIS ) 0.05 % ophthalmic emulsion Place 1 drop into both eyes 2 (two) times daily. 180 each 3   estradiol  (ESTRACE ) 0.1 MG/GM vaginal cream Place 1 gram vaginally 2 (two) times a week. 42.5 g 1   fluticasone  (FLONASE ) 50 MCG/ACT nasal spray Place 1 spray into both nostrils in the morning and at bedtime. 16 g 6   furosemide  (LASIX ) 40 MG tablet Take 1 tablet (40 mg total) by mouth daily. (Patient taking differently: Take 40 mg by mouth daily as needed.) 90 tablet 1   losartan  (COZAAR ) 100 MG tablet Take 1 tablet (100 mg total) by mouth daily. 90 tablet 3   Lutein 6 MG CAPS Take 6 mg by mouth daily.     metoprolol  succinate (  TOPROL -XL) 100 MG 24 hr tablet Take 1 tablet (100 mg total) by mouth daily with or immediately following a meal. 90 tablet 2   mometasone  (ELOCON ) 0.1 % cream Apply 1 application topically 2 (two) times a week. 45 g 1   potassium chloride  SA (KLOR-CON  M) 20 MEQ tablet Take 1 tablet (20 mEq total) by mouth daily. 90 tablet 1   rosuvastatin  (CRESTOR ) 20 MG tablet Take 1 tablet (20 mg total) by mouth daily. 90 tablet 3   tirzepatide  (MOUNJARO ) 10 MG/0.5ML Pen Inject 10 mg into the skin once a week. 2 mL 5   Turmeric 450 MG CAPS Take 450 mg by mouth daily.      No current facility-administered medications on file prior to visit.    ALLERGIES: Ace inhibitors, Dust mite extract, Oxycodone  hcl, Oxycodone -acetaminophen , Tree extract, Vicodin [hydrocodone-acetaminophen ], Victoza [liraglutide], and Adhesive [tape]  SH:  divorced, non smoker  Review  of Systems  Genitourinary: Negative.     PHYSICAL EXAMINATION:    BP (!) 145/77 (BP Location: Left Arm, Patient Position: Sitting, Cuff Size: Large)   Pulse 70   Ht 5\' 6"  (1.676 m)   Wt 266 lb (120.7 kg)   LMP 05/20/2008   BMI 42.93 kg/m     General appearance: alert, cooperative and appears stated age Neck: no adenopathy, supple, symmetrical, trachea midline and thyroid  normal to inspection and palpation  Lymph:  no inguinal LAD noted Pelvic: External genitalia: sebaceous cyst in lower, inner labia majora on pt's left              Urethra:  normal appearing urethra with no masses, tenderness or lesions              Bartholins and Skenes: normal                  Chaperone was present for exam.  Assessment/Plan: 1. Sebaceous cyst (Primary) - finding discussed today.  To not feel this needs removal or biopsy.  D/w pt.   2. Vaginal burning - POCT Urinalysis Dipstick - Urine Culture  3. Acute cystitis without hematuria - sulfamethoxazole -trimethoprim  (BACTRIM  DS) 800-160 MG tablet; Take 1 tablet by mouth 2 (two) times daily.  Dispense: 10 tablet; Refill: 0 - fluconazole  (DIFLUCAN ) 150 MG tablet; Take 1 tablet (150 mg total) by mouth now for 1 dose and repeat in 72 hours.  Dispense: 2 tablet; Refill: 1  4. Vitamin B 12 deficiency - Vitamin B12  5. Thyroid  nodule - will refer to Dr. Sofia Dunn to consult prior to thyroid  biopsy - Thyroid  Panel With TSH - Ambulatory referral to General Surgery

## 2023-10-28 ENCOUNTER — Encounter (HOSPITAL_BASED_OUTPATIENT_CLINIC_OR_DEPARTMENT_OTHER): Payer: Self-pay | Admitting: Obstetrics & Gynecology

## 2023-10-28 ENCOUNTER — Other Ambulatory Visit: Payer: Self-pay

## 2023-10-28 ENCOUNTER — Ambulatory Visit (HOSPITAL_BASED_OUTPATIENT_CLINIC_OR_DEPARTMENT_OTHER): Payer: Self-pay | Admitting: Obstetrics & Gynecology

## 2023-10-28 DIAGNOSIS — E041 Nontoxic single thyroid nodule: Secondary | ICD-10-CM

## 2023-10-28 LAB — THYROID PANEL WITH TSH
Free Thyroxine Index: 2 (ref 1.2–4.9)
T3 Uptake Ratio: 24 % (ref 24–39)
T4, Total: 8.3 ug/dL (ref 4.5–12.0)
TSH: 1.15 u[IU]/mL (ref 0.450–4.500)

## 2023-10-28 LAB — VITAMIN B12: Vitamin B-12: >2000 pg/mL — ABNORMAL HIGH (ref 232–1245)

## 2023-10-29 ENCOUNTER — Encounter (HOSPITAL_BASED_OUTPATIENT_CLINIC_OR_DEPARTMENT_OTHER): Payer: Self-pay | Admitting: Obstetrics & Gynecology

## 2023-10-29 LAB — URINE CULTURE

## 2023-11-06 ENCOUNTER — Encounter: Payer: Self-pay | Admitting: Family Medicine

## 2023-11-10 NOTE — Progress Notes (Signed)
 [facility] Clinical Visit Note     Referring physician:       OPHTHALMIC EXAM:  Contact Lens Exam     Current Contact Lens Rx       Dist VA Centration Movement   Right 20/20 Well-centered None   Left 20/30 Well-centered None         Final Contact Lens Rx       Brand Base Curve Diameter Sphere Cylinder Axis Lens Addl. Specs   Right SynergEyes VS 8.4 16.0 -1.50 -1.25 135 3600 36/44 thp   Left SynergEyes VS 8.4 16.0 -2.00 -2.00 025 3600 36/44 thp    Expiration Date: 11/09/2024   Replacement: Yearly   Solutions: boston simplus   Wearing Schedule: Daily Wear                 ASSESSMENT/PLAN:  Alexandra Walker is a 68 y.o. female who presents with:    No diagnosis found.     Explained the diagnoses, plan, and follow up with the patient and they expressed understanding.  Patient expressed understanding of the importance of proper follow up care.     Ophthalmic Meds Ordered this visit:  There were no meds ordered this visit.      No follow-ups on file.  There are no Patient Instructions on file for this visit.   Alexandra Walker, LDO [facility]    Abbreviations: M myopia (nearsighted); A astigmatism; H hyperopia (farsighted); P presbyopia; Mrx spectacle prescription;  CTL contact lenses; OD right eye; OS left eye; OU both eyes  XT exotropia; ET esotropia; PEK punctate epithelial keratitis; PEE punctate epithelial erosions; DES dry eye syndrome; MGD meibomian gland dysfunction; ATs artificial tears; PFAT's preservative free artificial tears; NSC nuclear sclerotic cataract; PSC posterior subcapsular cataract; ERM epi-retinal membrane; PVD posterior vitreous detachment; RD retinal detachment; DM diabetes mellitus; DR diabetic retinopathy; NPDR non-proliferative diabetic retinopathy; PDR proliferative diabetic retinopathy; CSME clinically significant macular edema; DME diabetic macular edema; dbh dot blot hemorrhages; CWS cotton wool spot; POAG primary  open angle glaucoma; C/D cup-to-disc ratio; HVF humphrey visual field; GVF goldmann visual field; OCT optical coherence tomography; IOP intraocular pressure; BRVO Branch retinal vein occlusion; CRVO central retinal vein occlusion; CRAO central retinal artery occlusion; BRAO branch retinal artery occlusion; RT retinal tear; SB scleral buckle; PPV pars plana vitrectomy; VH Vitreous hemorrhage; PRP panretinal laser photocoagulation; IVK intravitreal kenalog ; VMT vitreomacular traction; MH Macular hole;  NVD neovascularization of the disc; NVE neovascularization elsewhere; AREDS age related eye disease study; ARMD age related macular degeneration; POAG primary open angle glaucoma; EBMD epithelial/anterior basement membrane dystrophy; ACIOL anterior chamber intraocular lens; IOL intraocular lens; PCIOL posterior chamber intraocular lens; Phaco/IOL phacoemulsification with intraocular lens placement; PRK photorefractive keratectomy; LASIK laser assisted in situ keratomileusis; HTN hypertension; DM diabetes mellitus; COPD chronic obstructive pulmonary disease

## 2023-11-14 ENCOUNTER — Ambulatory Visit

## 2023-11-19 ENCOUNTER — Other Ambulatory Visit: Payer: Self-pay | Admitting: Surgery

## 2023-11-19 DIAGNOSIS — E041 Nontoxic single thyroid nodule: Secondary | ICD-10-CM

## 2023-11-19 DIAGNOSIS — E042 Nontoxic multinodular goiter: Secondary | ICD-10-CM | POA: Diagnosis not present

## 2023-11-24 ENCOUNTER — Other Ambulatory Visit (HOSPITAL_BASED_OUTPATIENT_CLINIC_OR_DEPARTMENT_OTHER): Payer: Self-pay

## 2023-11-26 ENCOUNTER — Ambulatory Visit (INDEPENDENT_AMBULATORY_CARE_PROVIDER_SITE_OTHER): Admitting: Neurology

## 2023-11-26 ENCOUNTER — Encounter: Payer: Self-pay | Admitting: Neurology

## 2023-11-26 VITALS — BP 128/98 | HR 80 | Ht 66.0 in | Wt 265.2 lb

## 2023-11-26 DIAGNOSIS — G4485 Primary stabbing headache: Secondary | ICD-10-CM

## 2023-11-26 NOTE — Progress Notes (Signed)
 [facility]  Provider:  Dr Ming Kunka Referring Provider: [name], MD Primary Care Physician:  [name], MD  Chief Complaint  Patient presents with   Headache    RM 1, Pt alone. Pt states has been having headaches about 3 weeks - describes as ice pick headaches. States they go away as fast as they come on and yesterday had a dull HA that would not go away. Pt took Flexeril  and said it helped some. Pt states recently dx with thyroid  nodules but there seems to be little concern about them at this point.    HPI:  Alexandra Walker is a 68 y.o. female and seen here on 11/26/2023 upon referral from Dr. Jodie for a Consultation/ Evaluation of new onset headache, described as an ice-pick headache. She described a shooting headaches entering the head from occiput and feels like exiting through the eye sockets.  Its startling.  Onset 3 weeks ago.  Sometimes her head feels heavy.   No weakness and tingling.  She has occipital and neck pain.       Review of Systems: Out of a complete 14 system review, the patient complains of only the following symptoms, and all other reviewed systems are negative.  See above, no associated nausea, photophobia, no vertigo.   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   Social Drivers of Health   Financial Resource Strain: Low Risk  (06/13/2023)   Overall Financial Resource Strain (CARDIA)    Difficulty of Paying Living Expenses: Not hard at all  Food Insecurity: No Food Insecurity (06/13/2023)   Hunger Vital Sign    Worried About Running Out of Food in the Last Year: Never true    Ran Out of Food in the Last Year: Never true  Transportation Needs: No Transportation Needs (06/13/2023)   PRAPARE - Administrator, Civil Service (Medical): No    Lack of Transportation (Non-Medical): No  Physical Activity: Insufficiently Active (06/13/2023)   Exercise Vital Sign    Days of Exercise per Week: 1 day    Minutes of Exercise per Session: 30 min  Stress: No Stress Concern Present (06/13/2023)   [employer] of Occupational Health - Occupational Stress Questionnaire    Feeling of Stress : Only a little  Social Connections: Moderately Integrated (06/13/2023)   Social Connection and Isolation Panel    Frequency of Communication with Friends and Family: Twice a week    Frequency of Social Gatherings with Friends and Family: Once a week    Attends Religious Services: More than 4 times per year    Active Member of Clubs or Organizations: Yes    Attends  Engineer, structural: More than 4 times per year    Marital Status: Divorced  Catering manager Violence: Not on file    Family History  Problem Relation Age of Onset   Coronary artery disease Mother    Hypertension Mother    Heart disease Mother    Heart attack Mother    Diabetes Mother    Hyperlipidemia Mother    Kidney disease Mother    Depression Mother    Sleep apnea Mother    Obesity Mother    Heart attack Father    Heart disease Father    Sudden death Father    Hypertension Sister    Hypertension Brother    Colon cancer Neg Hx    Colon polyps Neg Hx    Esophageal cancer Neg Hx    Rectal cancer Neg Hx    Stomach cancer Neg Hx     Past  Medical History:  Diagnosis Date   ADD (attention deficit disorder)    Allergy    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HCC)    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   lasted about 6 hours, Echo did not show anything   Bilateral swelling of feet    Cholelithiasis    Chronic depressive disorder    Chronic diastolic CHF (congestive heart failure) (HCC)    Circadian rhythm sleep disorder, shift work    DDD (degenerative disc disease), 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   Diabetes (HCC)    Diverticulosis    Minimal   Fatty liver    GERD (gastroesophageal reflux disease)    Heart murmur    Hepatic steatosis    History of hiatal hernia 2017   Small   HLA B27 (HLA B27 positive)    Hx of pancreatitis    Hyperlipidemia    Hypertension    IBS (irritable bowel syndrome)    Ischemic colitis (HCC)    Lactose intolerance    Lumbar scoliosis 2010   Mild   Migraine headache without aura 05/10/2015   Multiple thyroid  nodules    Nephrolithiasis 2014   Left, minimal   Obesity due to excess calories with serious comorbidity 05/24/2019   Palpitations    Prediabetes    Presbyopia    Right bundle branch block    Sepsis (HCC) 01/2016   Sleep apnea    Sleep hypopnea    Vitamin D  deficiency     Past Surgical History:  Procedure Laterality Date   BREAST BIOPSY Left    CERVICAL CONE BIOPSY     CHOLECYSTECTOMY N/A 10/26/2014   Procedure: LAPAROSCOPIC CHOLECYSTECTOMY;  Surgeon: Vicenta Poli, MD;  Location: [facility];  Service: General;  Laterality: N/A;   COLONOSCOPY W/ BIOPSIES  multiple   CYSTOSCOPY WITH RETROGRADE PYELOGRAM, URETEROSCOPY AND STENT PLACEMENT Left 06/26/2018   Procedure: CYSTOSCOPY WITH RETROGRADE PYELOGRAM, URETEROSCOPY AND STENT PLACEMENT;  Surgeon: Alvaro Hummer, MD;  Location: [facility] [place];  Service: Urology;  Laterality: Left;   DIAGNOSTIC LAPAROSCOPY     DILATION AND CURETTAGE OF UTERUS  11/2009   DUPUYTREN CONTRACTURE RELEASE Left    x 3 , Dr Camella    ESOPHAGOGASTRODUODENOSCOPY  multiple   EYE SURGERY     HOLMIUM LASER APPLICATION Left 06/26/2018   Procedure: HOLMIUM LASER APPLICATION;  Surgeon: Alvaro Hummer, MD;  Location: [facility];  Service: Urology;  Laterality: Left;   KNEE ARTHROSCOPY Left 11/2008   LAPAROSCOPIC VAGINAL HYSTERECTOMY WITH SALPINGO OOPHORECTOMY Bilateral 10/03/2015   Procedure: LAPAROSCOPIC ASSISTED VAGINAL HYSTERECTOMY WITH SALPINGECTOMY;  Surgeon: Shanda  SHAUNNA Muscat, MD;  Location: [facility];  Service: Gynecology;  Laterality: Bilateral;   LASIK  1990   RK   LEFT FINGER SURGERY     LIVER BIOPSY     MASS EXCISION Left 12/24/2012   Procedure: LEFT EXCISION OF PALMAR MASS X TWO;  Surgeon: Elsie Mussel, MD;  Location: [facility];  Service: Orthopedics;  Laterality: Left;   PHOTOREFRACTIVE KERATOTOMY  1994   RADIAL KERATOTOMY  1994   TRANSOBTURATOR SLING      Current Outpatient Medications  Medication Sig Dispense Refill   amphetamine -dextroamphetamine  (ADDERALL) 10 MG tablet Take 1 tablet (10 mg total) by mouth daily as needed. (Patient taking differently: Take 5 mg by mouth daily as needed.) 30 tablet 0   aspirin  325 MG tablet Take 325 mg by mouth daily.     cholecalciferol (VITAMIN D ) 1000 UNITS tablet Take 2,000 Units by mouth daily.      cyanocobalamin  (VITAMIN B12) 1000 MCG tablet Take 1,000 mcg by mouth daily.     estradiol  (ESTRACE ) 0.1 MG/GM vaginal cream Place 1 gram vaginally 2 (two) times a week. (Patient taking differently: Place 1 g vaginally as needed.) 42.5 g 1   furosemide  (LASIX ) 40 MG tablet Take 1 tablet (40 mg total) by mouth daily. (Patient taking differently: Take 40 mg by mouth daily as needed.) 90 tablet 1   losartan  (COZAAR ) 100 MG tablet Take 1 tablet (100 mg total) by mouth daily. 90 tablet 3   Lutein 6 MG CAPS Take 6 mg by mouth daily.     metoprolol  succinate (TOPROL -XL) 100 MG 24 hr tablet Take 1 tablet (100 mg total) by mouth daily with or immediately  following a meal. 90 tablet 2   mometasone  (ELOCON ) 0.1 % cream Apply 1 application topically 2 (two) times a week. (Patient taking differently: Apply 1 Application topically as needed.) 45 g 1   potassium chloride  SA (KLOR-CON  M) 20 MEQ tablet Take 1 tablet (20 mEq total) by mouth daily. (Patient taking differently: Take 20 mEq by mouth as needed.) 90 tablet 1   rosuvastatin  (CRESTOR ) 20 MG tablet Take 1 tablet (20 mg total) by mouth daily. 90 tablet 3   tirzepatide  (MOUNJARO ) 10 MG/0.5ML Pen Inject 10 mg into the skin once a week. 2 mL 5   cycloSPORINE  (RESTASIS ) 0.05 % ophthalmic emulsion Place 1 drop into both eyes 2 (two) times daily. (Patient not taking: Reported on 11/26/2023) 180 each 3   fluticasone  (FLONASE ) 50 MCG/ACT nasal spray Place 1 spray into both nostrils in the morning and at bedtime. 16 g 6   sulfamethoxazole -trimethoprim  (BACTRIM  DS) 800-160 MG tablet Take 1 tablet by mouth 2 (two) times daily. (Patient not taking: Reported on 11/26/2023) 10 tablet 0   Turmeric 450 MG CAPS Take 450 mg by mouth daily.  (Patient not taking: Reported on 11/26/2023)     No current facility-administered medications for this visit.    Allergies as of 11/26/2023 - Review Complete 11/26/2023  Allergen Reaction Noted   Ace inhibitors Other (See Comments) 09/05/2015   Dust mite extract Other (See Comments) 12/29/2020   Oxycodone  hcl Other (See Comments) and Nausea And Vomiting 04/06/2018   Oxycodone -acetaminophen  Nausea And Vomiting 09/19/2011   Tree extract Other (See Comments) 08/12/2022   Vicodin [hydrocodone-acetaminophen ] Nausea And Vomiting 09/19/2011   Victoza [liraglutide] Other (See Comments) 09/01/2014   Adhesive [tape] Rash 06/18/2018    Vitals: BP (!) 128/98 (BP Location: Left Arm, Patient Position: Sitting, Cuff Size: Large) Comment: Pt  had caffeine recently  Pulse 80   Ht 5' 6 (1.676 m)   Wt 265 lb 3.2 oz (120.3 kg)   LMP 05/20/2008   BMI 42.80 kg/m     Physical  exam:  General: The patient is awake, alert and appears not in acute distress.  The patient is well groomed. Head: Normocephalic, atraumatic.  Neck is tense-  Cardiovascular:  Regular rate and palpable peripheral pulse:  Respiratory: clear to auscultation.    Neurologic exam : The patient is awake and alert, oriented to place and time.   Memory subjective  described as intact.  There is a normal attention span & concentration ability.  Speech is fluent without  dysarthria, dysphonia or aphasia.  Mood and affect are appropriate.  Cranial nerves: Pupils are equal in size and round but not very reactive to light.  Funduscopic exam without evidence of pallor or edema.  Extraocular movements  in vertical and horizontal planes intact and without nystagmus. Visual fields by finger perimetry are intact. Hearing to finger rub intact.  Facial sensation intact to fine touch. Facial motor strength is symmetric and tongue and uvula move midline.  Motor exam:   Normal tone and normal muscle bulk and symmetric normal strength in all extremities. Grip Strength  is preserved Proximal strength of shoulder muscles and hip flexors was intact .  no evidence of ataxia, dysmetria or tremor. Deep tendon reflexes: in the  upper and lower extremities are symmetric and  brisk without Clonus.   Assessment: Total time for face to face interview and examination, for review of  images and laboratory testing, neurophysiology testing and pre-existing records, including out-of -network , was 35 minutes. Assessment is as follows here:  Ice pick headaches, also known as primary stabbing headaches or ophthalmodynia periodica, are characterized by sudden, sharp, and intense stabbing pains in the head. These pains typically last only a few seconds, though they can recur multiple times a day or in clusters. The pain can occur anywhere in the head, including the frontal area (behind the forehead and eyes), temporal area  (sides of the head near the ears), or even behind the eyes. While the exact cause of ice pick headaches is unknown, some research suggests that they may be related to overactivity of the nerve cells involved in pain processing in the brain. Other possible contributing factors include irritated nerves, inflammation, and temporary dysfunction of nerve fibers.  Certain factors may trigger or worsen ice pick headaches, including: Migraines. Stress and anxiety. Sudden movements or posture changes. Bright lights. Physical exertion. Lack of sleep. Alcohol consumption.   1)   Ice pick headaches, non- migrainous and possible additional occipital component.    Plan:  Treatment plan and additional workup planned after today includes:   1) MRA brain.     2) warm and cold compresses, and indomethacin-  prn po but not on an empty stomach !  Reduce sleep deprivation .  If no relief by medication po, will refer for botox evaluation.     The patient's condition requires frequent monitoring and adjustments in the treatment plan, reflecting the ongoing complexity of care.  This provider is the continuing focal point for all needed services for this condition.   Dedra Gores, MD  [facility] Board certified by The [employer] of Sleep Medicine and Diplomate of the [employer] of Sleep Medicine. Board certified In Neurology through the ABPN, Fellow of the [employer] of Neurology.

## 2023-11-26 NOTE — Addendum Note (Signed)
 Addended by: CHALICE SAUNAS on: 11/26/2023 11:39 AM   Modules accepted: Orders

## 2023-11-26 NOTE — Patient Instructions (Signed)
 Assessment is as follows here:  Ice pick headaches,   also known as primary stabbing headaches or ophthalmodynia periodica, are characterized by sudden, sharp, and intense stabbing pains in the head. These pains typically last only a few seconds, though they can recur multiple times a day or in clusters. The pain can occur anywhere in the head, including the frontal area (behind the forehead and eyes), temporal area (sides of the head near the ears), or even behind the eyes. While the exact cause of ice pick headaches is unknown, some research suggests that they may be related to overactivity of the nerve cells involved in pain processing in the brain. Other possible contributing factors include irritated nerves, inflammation, and temporary dysfunction of nerve fibers.  Certain factors may trigger or worsen ice pick headaches, including: Migraines. Stress and anxiety. Sudden movements or posture changes. Bright lights. Physical exertion. Lack of sleep. Alcohol consumption.   1)   Ice pick headaches, non- migrainous and possible additional occipital component.    Plan:  Treatment plan and additional workup planned after today includes:   1) MRA brain.     2) warm and cold compresses, and indomethacin-  prn po but not on an empty stomach !  Reduce sleep deprivation .  If no relief by medication po, will refer for botox evaluation.

## 2023-11-27 ENCOUNTER — Ambulatory Visit: Payer: Self-pay | Admitting: Neurology

## 2023-11-27 LAB — BASIC METABOLIC PANEL WITH GFR
BUN/Creatinine Ratio: 22 (ref 12–28)
BUN: 16 mg/dL (ref 8–27)
CO2: 24 mmol/L (ref 20–29)
Calcium: 9.8 mg/dL (ref 8.7–10.3)
Chloride: 102 mmol/L (ref 96–106)
Creatinine, Ser: 0.74 mg/dL (ref 0.57–1.00)
Glucose: 99 mg/dL (ref 70–99)
Potassium: 4.4 mmol/L (ref 3.5–5.2)
Sodium: 139 mmol/L (ref 134–144)
eGFR: 90 mL/min/1.73 (ref >59)

## 2023-11-27 LAB — C-REACTIVE PROTEIN: CRP: 7 mg/L (ref 0–10)

## 2023-11-27 LAB — SEDIMENTATION RATE: Sed Rate: 24 mm/h (ref 0–40)

## 2023-11-28 ENCOUNTER — Other Ambulatory Visit (HOSPITAL_COMMUNITY)
Admission: RE | Admit: 2023-11-28 | Discharge: 2023-11-28 | Disposition: A | Discharge status: Home / Self Care | Source: Ambulatory Visit | Attending: Surgery | Admitting: Surgery

## 2023-11-28 ENCOUNTER — Ambulatory Visit
Admission: RE | Admit: 2023-11-28 | Discharge: 2023-11-28 | Disposition: A | Discharge status: Home / Self Care | Source: Ambulatory Visit | Attending: Surgery | Admitting: Surgery

## 2023-11-28 DIAGNOSIS — E0789 Other specified disorders of thyroid: Secondary | ICD-10-CM | POA: Diagnosis not present

## 2023-11-28 DIAGNOSIS — E041 Nontoxic single thyroid nodule: Secondary | ICD-10-CM | POA: Insufficient documentation

## 2023-11-28 DIAGNOSIS — E042 Nontoxic multinodular goiter: Secondary | ICD-10-CM | POA: Diagnosis not present

## 2023-12-01 NOTE — Progress Notes (Signed)
 Patient underwent thyroid  FNA 11/28/23. She called the clinic today due to some neck pain/tenderness. She spoke with Corean, US  tech. Patient stated she could still talk, swallow, and breathe ok and that she didn't see any outward signs of swelling.   Corean offered to have the patient come to the clinic today for repeat imaging but the patient stated she would continue with conservative measures for now - ice, ibuprofen , etc.   The patient stated she would call the clinic if she wanted to be evaluated. Corean also advised the patient to seek immediate medical attention if she were to develop concerning symptoms such as difficulty breathing.  Warren Dais, AGACNP-BC 12/01/2023, 11:33 AM

## 2023-12-02 ENCOUNTER — Ambulatory Visit

## 2023-12-02 DIAGNOSIS — G4485 Primary stabbing headache: Secondary | ICD-10-CM

## 2023-12-02 LAB — CYTOLOGY - NON PAP

## 2023-12-03 ENCOUNTER — Encounter: Payer: Self-pay | Admitting: Family Medicine

## 2023-12-03 ENCOUNTER — Ambulatory Visit: Payer: Self-pay | Admitting: Surgery

## 2023-12-03 ENCOUNTER — Encounter (HOSPITAL_BASED_OUTPATIENT_CLINIC_OR_DEPARTMENT_OTHER): Payer: Self-pay | Admitting: Obstetrics & Gynecology

## 2023-12-03 NOTE — Progress Notes (Signed)
 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  It has now been submitted for [facility] testing and will take about 3 weeks.  I will contact you with the results when available.  Krystal Spinner, MD [facility] practice Office: [phone]

## 2023-12-04 ENCOUNTER — Ambulatory Visit

## 2023-12-07 ENCOUNTER — Encounter: Payer: Self-pay | Admitting: Neurology

## 2023-12-12 ENCOUNTER — Ambulatory Visit

## 2023-12-15 DIAGNOSIS — E041 Nontoxic single thyroid nodule: Secondary | ICD-10-CM | POA: Diagnosis not present

## 2023-12-16 ENCOUNTER — Ambulatory Visit: Payer: Self-pay | Admitting: Surgery

## 2023-12-16 ENCOUNTER — Encounter (HOSPITAL_COMMUNITY): Payer: Self-pay

## 2023-12-16 NOTE — Progress Notes (Signed)
 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  Will manage as benign.  Will plan to follow up with me in one year with a repeat TSH level and USN.  Burnard - please arrange one year follow up.  Krystal Spinner, MD [facility] practice Office: [phone]

## 2023-12-17 ENCOUNTER — Other Ambulatory Visit: Payer: Self-pay | Admitting: Neurology

## 2023-12-17 DIAGNOSIS — G4733 Obstructive sleep apnea (adult) (pediatric): Secondary | ICD-10-CM

## 2023-12-17 DIAGNOSIS — G4726 Circadian rhythm sleep disorder, shift work type: Secondary | ICD-10-CM

## 2023-12-17 NOTE — Progress Notes (Unsigned)
 Patient suffered severe headaches and insomnia the night of the HST , would like  HST repeated. CD
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
## 2023-12-18 NOTE — Progress Notes (Signed)
 Noted, thank you. Will set patient up for repeat HST.

## 2023-12-23 ENCOUNTER — Encounter: Payer: Self-pay | Admitting: Neurology

## 2023-12-24 ENCOUNTER — Encounter (HOSPITAL_BASED_OUTPATIENT_CLINIC_OR_DEPARTMENT_OTHER): Payer: Self-pay | Admitting: Emergency Medicine

## 2023-12-24 ENCOUNTER — Other Ambulatory Visit: Payer: Self-pay

## 2023-12-24 ENCOUNTER — Emergency Department (HOSPITAL_BASED_OUTPATIENT_CLINIC_OR_DEPARTMENT_OTHER)
Admission: EM | Admit: 2023-12-24 | Discharge: 2023-12-24 | Disposition: A | Discharge status: Home / Self Care | Source: Home / Self Care | Attending: Emergency Medicine | Admitting: Emergency Medicine

## 2023-12-24 DIAGNOSIS — R42 Dizziness and giddiness: Secondary | ICD-10-CM

## 2023-12-24 DIAGNOSIS — Z79899 Other long term (current) drug therapy: Secondary | ICD-10-CM | POA: Insufficient documentation

## 2023-12-24 DIAGNOSIS — N390 Urinary tract infection, site not specified: Secondary | ICD-10-CM | POA: Insufficient documentation

## 2023-12-24 DIAGNOSIS — R519 Headache, unspecified: Secondary | ICD-10-CM | POA: Diagnosis not present

## 2023-12-24 DIAGNOSIS — E119 Type 2 diabetes mellitus without complications: Secondary | ICD-10-CM | POA: Insufficient documentation

## 2023-12-24 DIAGNOSIS — C712 Malignant neoplasm of temporal lobe: Secondary | ICD-10-CM | POA: Diagnosis not present

## 2023-12-24 DIAGNOSIS — Z7982 Long term (current) use of aspirin: Secondary | ICD-10-CM | POA: Insufficient documentation

## 2023-12-24 DIAGNOSIS — I10 Essential (primary) hypertension: Secondary | ICD-10-CM | POA: Insufficient documentation

## 2023-12-24 DIAGNOSIS — R900 Intracranial space-occupying lesion found on diagnostic imaging of central nervous system: Secondary | ICD-10-CM | POA: Diagnosis not present

## 2023-12-24 DIAGNOSIS — G9389 Other specified disorders of brain: Secondary | ICD-10-CM | POA: Diagnosis not present

## 2023-12-24 LAB — URINALYSIS, ROUTINE W REFLEX MICROSCOPIC
Glucose, UA: NEGATIVE mg/dL
Hgb urine dipstick: NEGATIVE
Ketones, ur: 15 mg/dL — AB
Nitrite: POSITIVE — AB
Protein, ur: 30 mg/dL — AB
Specific Gravity, Urine: >=1.03 (ref 1.005–1.030)
pH: 5.5 (ref 5.0–8.0)

## 2023-12-24 LAB — COMPREHENSIVE METABOLIC PANEL WITH GFR
ALT: 20 U/L (ref 0–44)
AST: 23 U/L (ref 15–41)
Albumin: 4.2 g/dL (ref 3.5–5.0)
Alkaline Phosphatase: 100 U/L (ref 38–126)
Anion gap: 13 (ref 5–15)
BUN: 18 mg/dL (ref 8–23)
CO2: 24 mmol/L (ref 22–32)
Calcium: 9.9 mg/dL (ref 8.9–10.3)
Chloride: 104 mmol/L (ref 98–111)
Creatinine, Ser: 0.75 mg/dL (ref 0.44–1.00)
GFR, Estimated: >60 mL/min (ref >60)
Glucose, Bld: 124 mg/dL — ABNORMAL HIGH (ref 70–99)
Potassium: 3.4 mmol/L — ABNORMAL LOW (ref 3.5–5.1)
Sodium: 140 mmol/L (ref 135–145)
Total Bilirubin: 0.5 mg/dL (ref 0.0–1.2)
Total Protein: 7.2 g/dL (ref 6.5–8.1)

## 2023-12-24 LAB — CBC WITH DIFFERENTIAL/PLATELET
Abs Immature Granulocytes: 0.04 K/uL (ref 0.00–0.07)
Basophils Absolute: 0 K/uL (ref 0.0–0.1)
Basophils Relative: 1 %
Eosinophils Absolute: 0.1 K/uL (ref 0.0–0.5)
Eosinophils Relative: 2 %
HCT: 44 % (ref 36.0–46.0)
Hemoglobin: 14.4 g/dL (ref 12.0–15.0)
Immature Granulocytes: 1 %
Lymphocytes Relative: 16 %
Lymphs Abs: 1.3 K/uL (ref 0.7–4.0)
MCH: 26.9 pg (ref 26.0–34.0)
MCHC: 32.7 g/dL (ref 30.0–36.0)
MCV: 82.2 fL (ref 80.0–100.0)
Monocytes Absolute: 0.7 K/uL (ref 0.1–1.0)
Monocytes Relative: 8 %
Neutro Abs: 5.9 K/uL (ref 1.7–7.7)
Neutrophils Relative %: 72 %
Platelets: 300 K/uL (ref 150–400)
RBC: 5.35 MIL/uL — ABNORMAL HIGH (ref 3.87–5.11)
RDW: 12.4 % (ref 11.5–15.5)
WBC: 8.2 K/uL (ref 4.0–10.5)
nRBC: 0 % (ref 0.0–0.2)

## 2023-12-24 LAB — URINALYSIS, MICROSCOPIC (REFLEX): RBC / HPF: NONE SEEN RBC/hpf (ref 0–5)

## 2023-12-24 MED ORDER — SODIUM CHLORIDE 0.9 % IV BOLUS
500.0000 mL | Freq: Once | INTRAVENOUS | Status: AC
  Administered 2023-12-24: 500 mL via INTRAVENOUS

## 2023-12-24 MED ORDER — CEPHALEXIN 250 MG PO CAPS
500.0000 mg | ORAL_CAPSULE | Freq: Once | ORAL | Status: AC
  Administered 2023-12-24: 500 mg via ORAL
  Filled 2023-12-24: qty 2

## 2023-12-24 MED ORDER — METOCLOPRAMIDE HCL 5 MG/ML IJ SOLN
5.0000 mg | Freq: Once | INTRAMUSCULAR | Status: AC
  Administered 2023-12-24: 5 mg via INTRAVENOUS
  Filled 2023-12-24: qty 2

## 2023-12-24 MED ORDER — DIPHENHYDRAMINE HCL 50 MG/ML IJ SOLN
12.5000 mg | Freq: Once | INTRAMUSCULAR | Status: AC
  Administered 2023-12-24: 12.5 mg via INTRAVENOUS
  Filled 2023-12-24: qty 1

## 2023-12-24 MED ORDER — KETOROLAC TROMETHAMINE 15 MG/ML IJ SOLN
15.0000 mg | Freq: Once | INTRAMUSCULAR | Status: AC
  Administered 2023-12-24: 15 mg via INTRAVENOUS
  Filled 2023-12-24: qty 1

## 2023-12-24 MED ORDER — PROCHLORPERAZINE EDISYLATE 10 MG/2ML IJ SOLN
10.0000 mg | Freq: Once | INTRAMUSCULAR | Status: DC
  Filled 2023-12-24: qty 2

## 2023-12-24 MED ORDER — CEPHALEXIN 500 MG PO CAPS
500.0000 mg | ORAL_CAPSULE | Freq: Two times a day (BID) | ORAL | 0 refills | Status: DC
  Filled 2023-12-24: qty 14, 7d supply, fill #0

## 2023-12-24 NOTE — ED Triage Notes (Signed)
 Pt reports HA for weeks; reports dizziness, unsteady gait x 1 wk; had MRA head on 7/15 that was normal

## 2023-12-24 NOTE — Discharge Instructions (Signed)
 Make sure you are staying well-hydrated and eating regular meals.  Your urine shows urinary tract infection I would recommend taking Keflex  as prescribed.  Follow-up with your neurologist for headache.  Follow-up with primary care for recheck of symptoms.  Return to emergency room with new or worsening symptoms

## 2023-12-24 NOTE — ED Provider Notes (Signed)
 [place] [facility] Provider Note   CSN: [id number] Arrival date & time: 12/24/23  2040     Patient presents with: Headache and Dizziness   Alexandra Walker is a 68 y.o. female patient reports to emergency room with complaint of mild headache for 5 days and feeling lightheaded.  Lightheadedness will come and go and is worse when initially standing.  She has not noted any vertigo sensation or other room spinning.  She denies chest pain, shortness of breath.  She denies any infectious symptoms.  Denies any injury trauma or fall.  No sudden onset or severe headache.  No focal deficits.    Headache Associated symptoms: dizziness   Dizziness Associated symptoms: headaches        Prior to Admission medications   Medication Sig Start Date End Date Taking? Authorizing Provider  amphetamine -dextroamphetamine  (ADDERALL) 10 MG tablet Take 1 tablet (10 mg total) by mouth daily as needed. Patient taking differently: Take 5 mg by mouth daily as needed. 09/30/23   Jodie Lavern LITTIE, MD  aspirin  325 MG tablet Take 325 mg by mouth daily.    [provider]  cholecalciferol (VITAMIN D ) 1000 UNITS tablet Take 2,000 Units by mouth daily.     [provider]  cyanocobalamin  (VITAMIN B12) 1000 MCG tablet Take 1,000 mcg by mouth daily.    [provider]  cycloSPORINE  (RESTASIS ) 0.05 % ophthalmic emulsion Place 1 drop into both eyes 2 (two) times daily. Patient not taking: Reported on 11/26/2023 07/21/23     estradiol  (ESTRACE ) 0.1 MG/GM vaginal cream Place 1 gram vaginally 2 (two) times a week. Patient taking differently: Place 1 g vaginally as needed. 01/02/23   Cleotilde Ronal RAMAN, MD  fluticasone  (FLONASE ) 50 MCG/ACT nasal spray Place 1 spray into both nostrils in the morning and at bedtime. 03/28/22   Jodie Lavern LITTIE, MD  furosemide  (LASIX ) 40 MG tablet Take 1 tablet (40 mg total) by mouth daily. Patient taking differently: Take 40 mg by mouth daily  as needed. 03/10/23   Verlin Lonni BIRCH, MD  losartan  (COZAAR ) 100 MG tablet Take 1 tablet (100 mg total) by mouth daily. 09/29/23 09/28/24  Jodie Lavern LITTIE, MD  Lutein 6 MG CAPS Take 6 mg by mouth daily. 10/02/17   [provider]  metoprolol  succinate (TOPROL -XL) 100 MG 24 hr tablet Take 1 tablet (100 mg total) by mouth daily with or immediately following a meal. 10/20/23   Jodie Lavern LITTIE, MD  mometasone  (ELOCON ) 0.1 % cream Apply 1 application topically 2 (two) times a week. Patient taking differently: Apply 1 Application topically as needed. 01/02/23   Cleotilde Ronal RAMAN, MD  potassium chloride  SA (KLOR-CON  M) 20 MEQ tablet Take 1 tablet (20 mEq total) by mouth daily. Patient taking differently: Take 20 mEq by mouth as needed. 07/15/22   Verlin Lonni BIRCH, MD  rosuvastatin  (CRESTOR ) 20 MG tablet Take 1 tablet (20 mg total) by mouth daily. 08/11/23 08/10/24  Jodie Lavern LITTIE, MD  tirzepatide  (MOUNJARO ) 10 MG/0.5ML Pen Inject 10 mg into the skin once a week. 06/13/23   Jodie Lavern LITTIE, MD  Turmeric 450 MG CAPS Take 450 mg by mouth daily.  Patient not taking: Reported on 11/26/2023    [provider]    Allergies: Ace inhibitors, Dust mite extract, Oxycodone  hcl, Oxycodone -acetaminophen , Tree extract, Vicodin [hydrocodone-acetaminophen ], Victoza [liraglutide], and Adhesive [tape]    Review of Systems  Neurological:  Positive for dizziness and headaches.  Updated Vital Signs BP (!) 161/128 (BP Location: Right Arm)   Pulse 82   Temp 98.1 F (36.7 C) (Oral)   Resp 18   Ht 5' 6 (1.676 m)   Wt 114.3 kg   LMP 05/20/2008   SpO2 100%   BMI 40.67 kg/m   Physical Exam Vitals and nursing note reviewed.  Constitutional:      General: She is not in acute distress.    Appearance: She is not toxic-appearing.  HENT:     Head: Normocephalic and atraumatic.  Eyes:     General: No scleral icterus.    Conjunctiva/sclera: Conjunctivae normal.  Cardiovascular:     Rate and  Rhythm: Normal rate and regular rhythm.     Pulses: Normal pulses.     Heart sounds: Normal heart sounds.  Pulmonary:     Effort: Pulmonary effort is normal. No respiratory distress.     Breath sounds: Normal breath sounds.  Abdominal:     General: Abdomen is flat. Bowel sounds are normal.     Palpations: Abdomen is soft.     Tenderness: There is no abdominal tenderness.  Skin:    General: Skin is warm and dry.     Findings: No lesion.  Neurological:     General: No focal deficit present.     Mental Status: She is alert and oriented to person, place, and time. Mental status is at baseline.     GCS: GCS eye subscore is 4. GCS verbal subscore is 5. GCS motor subscore is 6.     Comments: Patient is alert oriented answering questions appropriately with no slurred speech.  Pupils equal and reactive.  No nystagmus.  No facial droop. No ataxia with finger-to-nose.  Sensation and strength of upper extremity intact.  Lower extremity sensation and strength intact.  Patient is ambulatory in room with steady gait.     (all labs ordered are listed, but only abnormal results are displayed) Labs Reviewed  CBC WITH DIFFERENTIAL/PLATELET - Abnormal; Notable for the following components:      Result Value   RBC 5.35 (*)    All other components within normal limits  COMPREHENSIVE METABOLIC PANEL WITH GFR - Abnormal; Notable for the following components:   Potassium 3.4 (*)    Glucose, Bld 124 (*)    All other components within normal limits  URINALYSIS, ROUTINE W REFLEX MICROSCOPIC - Abnormal; Notable for the following components:   APPearance HAZY (*)    Bilirubin Urine SMALL (*)    Ketones, ur 15 (*)    Protein, ur 30 (*)    Nitrite POSITIVE (*)    Leukocytes,Ua SMALL (*)    All other components within normal limits  URINALYSIS, MICROSCOPIC (REFLEX) - Abnormal; Notable for the following components:   Bacteria, UA MANY (*)    All other components within normal limits    EKG: EKG  Interpretation Date/Time:  Wednesday December 24 2023 21:33:03 EDT Ventricular Rate:  66 PR Interval:  211 QRS Duration:  156 QT Interval:  397 QTC Calculation: 416 R Axis:   -52  Text Interpretation: Sinus rhythm Left bundle branch block no change from prior Confirmed by Ruthe Cornet [phone]) on 12/24/2023 9:36:27 PM  Radiology: No results found.   Procedures   Medications Ordered in the ED  diphenhydrAMINE  (BENADRYL ) injection 12.5 mg (12.5 mg Intravenous Given 12/24/23 2123)  ketorolac  (TORADOL ) 15 MG/ML injection 15 mg (15 mg Intravenous Given 12/24/23 2123)  sodium chloride  0.9 % bolus 500 mL (500  mLs Intravenous New Bag/Given 12/24/23 2123)  metoCLOPramide  (REGLAN ) injection 5 mg (5 mg Intravenous Given 12/24/23 2123)                                    Medical Decision Making Amount and/or Complexity of Data Reviewed Labs: ordered.  Risk Prescription drug management.   [name] Pouch 68 y.o. presented today for HA. Working Ddx: tension headache, migraine, intracranial mass, intracranial hemorrhage, intracranial infection including meningitis vs encephalitis, trigeminal neuralgia, AVM, sinusitis, cerebral aneurysm, muscular headache, cavernous sinus thrombosis, carotid artery dissection.  R/o DDx: intracranial mass, hemorrhage,or infection including meningitis vs encephalitis, trigeminal neuralgia, AVM, cerebral aneurysm, muscular headache, cavernous sinus thrombosis, carotid artery dissection are less likely due to history of present illness, physical exam, labs/imaging findings  PMHX: Obstructive sleep apnea on CPAP, paroxysmal SVT, diabetes, hypertension, hyperlipidemia  Review of prior external notes: Patient was recently seen by neurology and had MRA with no acute findings.  Unique Tests and My Interpretation:  CBC: No leukocytosis.  Hemoglobin is 14 CMP: Potassium 3.4, no other electrolyte abnormality.  Normal kidney and liver function UA: Positive for nitrate,  leukocyte with many bacteria.   Problem List / ED Course / Critical interventions / Medication management  Patient reporting with complaint of mild headache.  On my exam she has no focal neurological deficit.  Symptoms do not seem consistent with TIA or stroke.  Nuys injury trauma or fall.  No chest pain shortness of breath.  EKG is at baseline.  I ordered medication including urine cocktail, normal saline Reevaluation of the patient after these medicines showed that the patient improved Patients vitals assessed. Upon arrival patient is hemodynamically stable.  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  Question if this is contributing to some of her symptoms.  She also has urine months positive for nitrates, leukocytes with many bacteria. No sign of systemic illness.  Given first dose here.  She has nonfocal neuroexam.  Given recent imaging and no severe headache or red flag symptoms do not feel repeat imaging is needed today.  Feel stable for discharge with outpatient follow-up with neurology.      Final diagnoses:  Lower urinary tract infectious disease  Lightheadedness    ED Discharge Orders          Ordered    cephALEXin  (KEFLEX ) 500 MG capsule  2 times daily        12/24/23 2258               Jaeleah Smyser, Warren SAILOR, PA-C 12/24/23 2305    Ruthe Cornet, DO 12/25/23 1503

## 2023-12-25 ENCOUNTER — Other Ambulatory Visit (HOSPITAL_BASED_OUTPATIENT_CLINIC_OR_DEPARTMENT_OTHER): Payer: Self-pay

## 2023-12-26 ENCOUNTER — Inpatient Hospital Stay (HOSPITAL_COMMUNITY)
Admission: EM | Admit: 2023-12-26 | Discharge: 2023-12-31 | DRG: 025 | Disposition: A | Discharge status: Home / Self Care | Attending: Family Medicine | Admitting: Family Medicine

## 2023-12-26 ENCOUNTER — Ambulatory Visit

## 2023-12-26 ENCOUNTER — Encounter (HOSPITAL_COMMUNITY): Payer: Self-pay

## 2023-12-26 ENCOUNTER — Emergency Department (HOSPITAL_COMMUNITY)

## 2023-12-26 ENCOUNTER — Other Ambulatory Visit: Payer: Self-pay

## 2023-12-26 DIAGNOSIS — K589 Irritable bowel syndrome without diarrhea: Secondary | ICD-10-CM | POA: Diagnosis present

## 2023-12-26 DIAGNOSIS — Z888 Allergy status to other drugs, medicaments and biological substances status: Secondary | ICD-10-CM | POA: Diagnosis not present

## 2023-12-26 DIAGNOSIS — E1159 Type 2 diabetes mellitus with other circulatory complications: Secondary | ICD-10-CM | POA: Diagnosis present

## 2023-12-26 DIAGNOSIS — G4700 Insomnia, unspecified: Secondary | ICD-10-CM | POA: Diagnosis present

## 2023-12-26 DIAGNOSIS — I447 Left bundle-branch block, unspecified: Secondary | ICD-10-CM | POA: Diagnosis present

## 2023-12-26 DIAGNOSIS — N390 Urinary tract infection, site not specified: Secondary | ICD-10-CM | POA: Diagnosis present

## 2023-12-26 DIAGNOSIS — Z7985 Long-term (current) use of injectable non-insulin antidiabetic drugs: Secondary | ICD-10-CM | POA: Diagnosis not present

## 2023-12-26 DIAGNOSIS — Z841 Family history of disorders of kidney and ureter: Secondary | ICD-10-CM

## 2023-12-26 DIAGNOSIS — E782 Mixed hyperlipidemia: Secondary | ICD-10-CM | POA: Diagnosis present

## 2023-12-26 DIAGNOSIS — I152 Hypertension secondary to endocrine disorders: Secondary | ICD-10-CM | POA: Diagnosis present

## 2023-12-26 DIAGNOSIS — C712 Malignant neoplasm of temporal lobe: Principal | ICD-10-CM | POA: Diagnosis present

## 2023-12-26 DIAGNOSIS — K429 Umbilical hernia without obstruction or gangrene: Secondary | ICD-10-CM | POA: Diagnosis not present

## 2023-12-26 DIAGNOSIS — E041 Nontoxic single thyroid nodule: Secondary | ICD-10-CM | POA: Diagnosis not present

## 2023-12-26 DIAGNOSIS — Z8249 Family history of ischemic heart disease and other diseases of the circulatory system: Secondary | ICD-10-CM | POA: Diagnosis not present

## 2023-12-26 DIAGNOSIS — G9389 Other specified disorders of brain: Principal | ICD-10-CM

## 2023-12-26 DIAGNOSIS — G4733 Obstructive sleep apnea (adult) (pediatric): Secondary | ICD-10-CM | POA: Diagnosis present

## 2023-12-26 DIAGNOSIS — Z833 Family history of diabetes mellitus: Secondary | ICD-10-CM | POA: Diagnosis not present

## 2023-12-26 DIAGNOSIS — Z818 Family history of other mental and behavioral disorders: Secondary | ICD-10-CM

## 2023-12-26 DIAGNOSIS — Z91048 Other nonmedicinal substance allergy status: Secondary | ICD-10-CM

## 2023-12-26 DIAGNOSIS — E1169 Type 2 diabetes mellitus with other specified complication: Secondary | ICD-10-CM | POA: Diagnosis present

## 2023-12-26 DIAGNOSIS — C719 Malignant neoplasm of brain, unspecified: Secondary | ICD-10-CM | POA: Diagnosis present

## 2023-12-26 DIAGNOSIS — E119 Type 2 diabetes mellitus without complications: Secondary | ICD-10-CM | POA: Diagnosis not present

## 2023-12-26 DIAGNOSIS — D496 Neoplasm of unspecified behavior of brain: Secondary | ICD-10-CM | POA: Diagnosis not present

## 2023-12-26 DIAGNOSIS — T380X5A Adverse effect of glucocorticoids and synthetic analogues, initial encounter: Secondary | ICD-10-CM | POA: Diagnosis not present

## 2023-12-26 DIAGNOSIS — Z87442 Personal history of urinary calculi: Secondary | ICD-10-CM

## 2023-12-26 DIAGNOSIS — K760 Fatty (change of) liver, not elsewhere classified: Secondary | ICD-10-CM | POA: Diagnosis present

## 2023-12-26 DIAGNOSIS — R42 Dizziness and giddiness: Secondary | ICD-10-CM

## 2023-12-26 DIAGNOSIS — Z7982 Long term (current) use of aspirin: Secondary | ICD-10-CM

## 2023-12-26 DIAGNOSIS — I110 Hypertensive heart disease with heart failure: Secondary | ICD-10-CM | POA: Diagnosis not present

## 2023-12-26 DIAGNOSIS — E739 Lactose intolerance, unspecified: Secondary | ICD-10-CM | POA: Diagnosis present

## 2023-12-26 DIAGNOSIS — Z79899 Other long term (current) drug therapy: Secondary | ICD-10-CM | POA: Diagnosis not present

## 2023-12-26 DIAGNOSIS — I471 Supraventricular tachycardia, unspecified: Secondary | ICD-10-CM | POA: Diagnosis present

## 2023-12-26 DIAGNOSIS — K7689 Other specified diseases of liver: Secondary | ICD-10-CM | POA: Diagnosis not present

## 2023-12-26 DIAGNOSIS — Z885 Allergy status to narcotic agent status: Secondary | ICD-10-CM

## 2023-12-26 DIAGNOSIS — R900 Intracranial space-occupying lesion found on diagnostic imaging of central nervous system: Secondary | ICD-10-CM | POA: Diagnosis present

## 2023-12-26 DIAGNOSIS — G936 Cerebral edema: Secondary | ICD-10-CM | POA: Diagnosis not present

## 2023-12-26 DIAGNOSIS — K219 Gastro-esophageal reflux disease without esophagitis: Secondary | ICD-10-CM | POA: Diagnosis present

## 2023-12-26 DIAGNOSIS — R519 Headache, unspecified: Secondary | ICD-10-CM | POA: Diagnosis not present

## 2023-12-26 DIAGNOSIS — Z90710 Acquired absence of both cervix and uterus: Secondary | ICD-10-CM

## 2023-12-26 DIAGNOSIS — I5032 Chronic diastolic (congestive) heart failure: Secondary | ICD-10-CM | POA: Diagnosis present

## 2023-12-26 DIAGNOSIS — Z9049 Acquired absence of other specified parts of digestive tract: Secondary | ICD-10-CM

## 2023-12-26 DIAGNOSIS — Z8349 Family history of other endocrine, nutritional and metabolic diseases: Secondary | ICD-10-CM

## 2023-12-26 DIAGNOSIS — M419 Scoliosis, unspecified: Secondary | ICD-10-CM | POA: Diagnosis present

## 2023-12-26 DIAGNOSIS — F909 Attention-deficit hyperactivity disorder, unspecified type: Secondary | ICD-10-CM | POA: Diagnosis present

## 2023-12-26 DIAGNOSIS — I10 Essential (primary) hypertension: Secondary | ICD-10-CM | POA: Diagnosis not present

## 2023-12-26 DIAGNOSIS — Z6841 Body Mass Index (BMI) 40.0 and over, adult: Secondary | ICD-10-CM | POA: Diagnosis not present

## 2023-12-26 DIAGNOSIS — K5730 Diverticulosis of large intestine without perforation or abscess without bleeding: Secondary | ICD-10-CM | POA: Diagnosis not present

## 2023-12-26 LAB — CBC WITH DIFFERENTIAL/PLATELET
Abs Immature Granulocytes: 0.03 K/uL (ref 0.00–0.07)
Basophils Absolute: 0 K/uL (ref 0.0–0.1)
Basophils Relative: 0 %
Eosinophils Absolute: 0.2 K/uL (ref 0.0–0.5)
Eosinophils Relative: 2 %
HCT: 43.3 % (ref 36.0–46.0)
Hemoglobin: 13.7 g/dL (ref 12.0–15.0)
Immature Granulocytes: 0 %
Lymphocytes Relative: 19 %
Lymphs Abs: 1.6 K/uL (ref 0.7–4.0)
MCH: 27.1 pg (ref 26.0–34.0)
MCHC: 31.6 g/dL (ref 30.0–36.0)
MCV: 85.6 fL (ref 80.0–100.0)
Monocytes Absolute: 0.9 K/uL (ref 0.1–1.0)
Monocytes Relative: 10 %
Neutro Abs: 5.9 K/uL (ref 1.7–7.7)
Neutrophils Relative %: 69 %
Platelets: 276 K/uL (ref 150–400)
RBC: 5.06 MIL/uL (ref 3.87–5.11)
RDW: 12.5 % (ref 11.5–15.5)
WBC: 8.6 K/uL (ref 4.0–10.5)
nRBC: 0 % (ref 0.0–0.2)

## 2023-12-26 LAB — COMPREHENSIVE METABOLIC PANEL WITH GFR
ALT: 20 U/L (ref 0–44)
AST: 20 U/L (ref 15–41)
Albumin: 3.5 g/dL (ref 3.5–5.0)
Alkaline Phosphatase: 77 U/L (ref 38–126)
Anion gap: 12 (ref 5–15)
BUN: 20 mg/dL (ref 8–23)
CO2: 22 mmol/L (ref 22–32)
Calcium: 9.4 mg/dL (ref 8.9–10.3)
Chloride: 106 mmol/L (ref 98–111)
Creatinine, Ser: 0.78 mg/dL (ref 0.44–1.00)
GFR, Estimated: >60 mL/min (ref >60)
Glucose, Bld: 127 mg/dL — ABNORMAL HIGH (ref 70–99)
Potassium: 4 mmol/L (ref 3.5–5.1)
Sodium: 140 mmol/L (ref 135–145)
Total Bilirubin: 0.3 mg/dL (ref 0.0–1.2)
Total Protein: 6.4 g/dL — ABNORMAL LOW (ref 6.5–8.1)

## 2023-12-26 LAB — MAGNESIUM: Magnesium: 1.8 mg/dL (ref 1.7–2.4)

## 2023-12-26 MED ORDER — INSULIN ASPART 100 UNIT/ML IJ SOLN
0.0000 [IU] | Freq: Three times a day (TID) | INTRAMUSCULAR | Status: DC
  Administered 2023-12-27: 1 [IU] via SUBCUTANEOUS
  Administered 2023-12-27: 2 [IU] via SUBCUTANEOUS
  Administered 2023-12-28 – 2023-12-30 (×9): 1 [IU] via SUBCUTANEOUS

## 2023-12-26 MED ORDER — GADOBUTROL 1 MMOL/ML IV SOLN
10.0000 mL | Freq: Once | INTRAVENOUS | Status: AC | PRN
  Administered 2023-12-26: 10 mL via INTRAVENOUS

## 2023-12-26 MED ORDER — DEXAMETHASONE SODIUM PHOSPHATE 10 MG/ML IJ SOLN
10.0000 mg | Freq: Four times a day (QID) | INTRAMUSCULAR | Status: AC
  Administered 2023-12-26 – 2023-12-27 (×4): 10 mg via INTRAVENOUS
  Filled 2023-12-26 (×4): qty 1

## 2023-12-26 MED ORDER — SODIUM CHLORIDE 0.9 % IV SOLN
1.0000 g | Freq: Once | INTRAVENOUS | Status: AC
  Administered 2023-12-26: 1 g via INTRAVENOUS
  Filled 2023-12-26: qty 10

## 2023-12-26 MED ORDER — DEXAMETHASONE SODIUM PHOSPHATE 10 MG/ML IJ SOLN
10.0000 mg | Freq: Once | INTRAMUSCULAR | Status: AC
  Administered 2023-12-26: 10 mg via INTRAVENOUS
  Filled 2023-12-26: qty 1

## 2023-12-26 NOTE — Progress Notes (Signed)
 Neurosurgery  I reviewed the MRI performed today.  There is a right posterior temporal lobe mass, possibly extra-axial.  There is associated edema in the right temporal and parietal lobes.  No significant gross change from previous MRA imaging on 7/15 where it could be identified in retrospect.  I am recommending an MRI with contrast including stealth protocol.  Unless her MRI is highly suggestive of a benign meningioma, she will need metastatic workup including CT chest/abdomen and pelvis.  In the meantime, she would benefit from Decadron  4 mg every 6 hours as needed.  Whether malignant or benign, I suspect this brain mass will ultimately require surgical resection.

## 2023-12-26 NOTE — ED Triage Notes (Signed)
 Pt coworkers concerned that pt is not herself Pt c/o HA , dizziness , intermittent in nature over the past 2 weeks.

## 2023-12-26 NOTE — ED Notes (Signed)
 ED-PA to bedside

## 2023-12-26 NOTE — Progress Notes (Signed)
 I attempted to see patient in ER but she was in MRI. Her post-contrast MRI was reviewed.  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  I am recommending metastatic workup with CT chest/abd/pelvis. This brain mass will ultimately require surgical resection.  In the meantime, she would benefit from Decadron  4 mg every 6 hours

## 2023-12-26 NOTE — ED Provider Triage Note (Signed)
 Emergency Medicine Provider Triage Evaluation Note  Alexandra Walker , a 68 y.o. female  was evaluated in triage.  Pt complains of HA on and off for past 10 weeks. Dull HA. Lightheadedness that comes and goes with positional changes. No falls, head injury  1/10 headache that improved with tylenol . No max intensity onset  Spoke to Dr. Voncile who recommended MRI for continued HA. Last MRA on 12/02/23. Pt is midwife for [employer]  Was seen on 12/24/23 and diagnosed with UTI but has not started keflex  b/c has not picked it up  Review of Systems  Positive: hpi Negative:   Physical Exam  BP (!) 172/108 (BP Location: Right Arm)   Pulse 91   Temp 98.7 F (37.1 C) (Oral)   Resp 20   Ht 5' 6 (1.676 m)   Wt 114.3 kg   LMP 05/20/2008   SpO2 98%   BMI 40.67 kg/m  Gen:   Awake, no distress   Resp:  Normal effort  MSK:   Moves extremities without difficulty  Other:  No focal deficits, slurred speech, aphasia. Grip strength equal. No pronator drift  Medical Decision Making  Medically screening exam initiated at 1:33 PM.  Appropriate orders placed.  Alexandra Walker was informed that the remainder of the evaluation will be completed by another provider, this initial triage assessment does not replace that evaluation, and the importance of remaining in the ED until their evaluation is complete.  Labs and MRI ordered   Alexandra Walker, Alexandra Walker 12/26/23 [id number]
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
## 2023-12-26 NOTE — ED Notes (Signed)
In MRI at this time

## 2023-12-26 NOTE — ED Notes (Addendum)
 Pt states Dr Rosslyn is the neurosurgeon they want & that they spoke with him via phone. Reports Dr Rosslyn was instructing them to notify the attending so another neurosurgeon won't be called. Dr Sim messaged via secure chat.

## 2023-12-27 ENCOUNTER — Inpatient Hospital Stay (HOSPITAL_COMMUNITY)

## 2023-12-27 ENCOUNTER — Encounter: Payer: Self-pay | Admitting: Neurology

## 2023-12-27 DIAGNOSIS — E119 Type 2 diabetes mellitus without complications: Secondary | ICD-10-CM

## 2023-12-27 DIAGNOSIS — R900 Intracranial space-occupying lesion found on diagnostic imaging of central nervous system: Secondary | ICD-10-CM | POA: Diagnosis not present

## 2023-12-27 DIAGNOSIS — I152 Hypertension secondary to endocrine disorders: Secondary | ICD-10-CM

## 2023-12-27 DIAGNOSIS — E1159 Type 2 diabetes mellitus with other circulatory complications: Secondary | ICD-10-CM

## 2023-12-27 DIAGNOSIS — E1169 Type 2 diabetes mellitus with other specified complication: Secondary | ICD-10-CM | POA: Diagnosis not present

## 2023-12-27 DIAGNOSIS — E782 Mixed hyperlipidemia: Secondary | ICD-10-CM

## 2023-12-27 LAB — CBC
HCT: 43.2 % (ref 36.0–46.0)
Hemoglobin: 14.1 g/dL (ref 12.0–15.0)
MCH: 27.1 pg (ref 26.0–34.0)
MCHC: 32.6 g/dL (ref 30.0–36.0)
MCV: 83.1 fL (ref 80.0–100.0)
Platelets: 259 K/uL (ref 150–400)
RBC: 5.2 MIL/uL — ABNORMAL HIGH (ref 3.87–5.11)
RDW: 12.2 % (ref 11.5–15.5)
WBC: 6.4 K/uL (ref 4.0–10.5)
nRBC: 0 % (ref 0.0–0.2)

## 2023-12-27 LAB — GLUCOSE, CAPILLARY
Glucose-Capillary: 171 mg/dL — ABNORMAL HIGH (ref 70–99)
Glucose-Capillary: 210 mg/dL — ABNORMAL HIGH (ref 70–99)
Glucose-Capillary: 176 mg/dL — ABNORMAL HIGH (ref 70–99)
Glucose-Capillary: 208 mg/dL — ABNORMAL HIGH (ref 70–99)

## 2023-12-27 LAB — COMPREHENSIVE METABOLIC PANEL WITH GFR
ALT: 24 U/L (ref 0–44)
AST: 20 U/L (ref 15–41)
Albumin: 3.4 g/dL — ABNORMAL LOW (ref 3.5–5.0)
Alkaline Phosphatase: 71 U/L (ref 38–126)
Anion gap: 9 (ref 5–15)
BUN: 10 mg/dL (ref 8–23)
CO2: 22 mmol/L (ref 22–32)
Calcium: 9.1 mg/dL (ref 8.9–10.3)
Chloride: 108 mmol/L (ref 98–111)
Creatinine, Ser: 0.66 mg/dL (ref 0.44–1.00)
GFR, Estimated: >60 mL/min (ref >60)
Glucose, Bld: 222 mg/dL — ABNORMAL HIGH (ref 70–99)
Potassium: 4 mmol/L (ref 3.5–5.1)
Sodium: 139 mmol/L (ref 135–145)
Total Bilirubin: 0.4 mg/dL (ref 0.0–1.2)
Total Protein: 6.6 g/dL (ref 6.5–8.1)

## 2023-12-27 LAB — HIV ANTIBODY (ROUTINE TESTING W REFLEX): HIV Screen 4th Generation wRfx: NONREACTIVE

## 2023-12-27 MED ORDER — VITAMIN D 25 MCG (1000 UNIT) PO TABS
2000.0000 [IU] | ORAL_TABLET | Freq: Every day | ORAL | Status: DC
  Administered 2023-12-27 – 2023-12-31 (×8): 2000 [IU] via ORAL
  Filled 2023-12-27 (×5): qty 2

## 2023-12-27 MED ORDER — ADULT MULTIVITAMIN W/MINERALS CH
1.0000 | ORAL_TABLET | Freq: Every day | ORAL | Status: DC
  Administered 2023-12-27 – 2023-12-31 (×8): 1 via ORAL
  Filled 2023-12-27 (×5): qty 1

## 2023-12-27 MED ORDER — ONDANSETRON HCL 4 MG/2ML IJ SOLN
4.0000 mg | Freq: Four times a day (QID) | INTRAMUSCULAR | Status: DC | PRN
Start: 2023-12-27 — End: 2023-12-29

## 2023-12-27 MED ORDER — IOHEXOL 350 MG/ML SOLN
75.0000 mL | Freq: Once | INTRAVENOUS | Status: AC | PRN
  Administered 2023-12-27: 75 mL via INTRAVENOUS

## 2023-12-27 MED ORDER — ENOXAPARIN SODIUM 40 MG/0.4ML IJ SOSY
40.0000 mg | PREFILLED_SYRINGE | INTRAMUSCULAR | Status: DC
  Administered 2023-12-27: 40 mg via SUBCUTANEOUS
  Filled 2023-12-27: qty 0.4

## 2023-12-27 MED ORDER — METOPROLOL SUCCINATE ER 100 MG PO TB24
100.0000 mg | ORAL_TABLET | Freq: Every day | ORAL | Status: DC
  Administered 2023-12-27 – 2023-12-28 (×2): 100 mg via ORAL
  Filled 2023-12-27 (×2): qty 1

## 2023-12-27 MED ORDER — MORPHINE SULFATE (PF) 2 MG/ML IV SOLN: 2.0000 mg | INTRAVENOUS | Status: DC | PRN

## 2023-12-27 MED ORDER — ROSUVASTATIN CALCIUM 20 MG PO TABS
20.0000 mg | ORAL_TABLET | Freq: Every day | ORAL | Status: DC
  Administered 2023-12-27 – 2023-12-31 (×8): 20 mg via ORAL
  Filled 2023-12-27 (×5): qty 1

## 2023-12-27 MED ORDER — IBUPROFEN 200 MG PO TABS
400.0000 mg | ORAL_TABLET | Freq: Two times a day (BID) | ORAL | Status: DC | PRN
  Administered 2023-12-27 – 2023-12-29 (×4): 400 mg via ORAL
  Filled 2023-12-27 (×4): qty 2

## 2023-12-27 MED ORDER — SODIUM CHLORIDE 0.9 % IV SOLN
1.0000 g | Freq: Once | INTRAVENOUS | Status: AC
  Administered 2023-12-27: 1 g via INTRAVENOUS
  Filled 2023-12-27: qty 10

## 2023-12-27 MED ORDER — LOSARTAN POTASSIUM 50 MG PO TABS
100.0000 mg | ORAL_TABLET | Freq: Every day | ORAL | Status: DC
  Administered 2023-12-27 – 2023-12-31 (×8): 100 mg via ORAL
  Filled 2023-12-27 (×5): qty 2

## 2023-12-27 MED ORDER — CYCLOBENZAPRINE HCL 10 MG PO TABS
5.0000 mg | ORAL_TABLET | Freq: Once | ORAL | Status: AC
  Administered 2023-12-27: 5 mg via ORAL
  Filled 2023-12-27: qty 1

## 2023-12-27 MED ORDER — LACTATED RINGERS IV SOLN: INTRAVENOUS | Status: AC

## 2023-12-27 MED ORDER — CYCLOBENZAPRINE HCL 10 MG PO TABS
5.0000 mg | ORAL_TABLET | Freq: Every day | ORAL | Status: DC | PRN
  Administered 2023-12-27: 5 mg via ORAL
  Filled 2023-12-27: qty 1

## 2023-12-27 MED ORDER — FLUTICASONE PROPIONATE 50 MCG/ACT NA SUSP: 1.0000 | Freq: Two times a day (BID) | NASAL | Status: DC | PRN

## 2023-12-27 MED ORDER — DIPHENHYDRAMINE HCL 12.5 MG/5ML PO ELIX
25.0000 mg | ORAL_SOLUTION | Freq: Every day | ORAL | Status: DC | PRN
  Administered 2023-12-27: 25 mg via ORAL
  Filled 2023-12-27 (×2): qty 10

## 2023-12-27 MED ORDER — DIPHENHYDRAMINE HCL 12.5 MG/5ML PO ELIX: 12.5000 mg | ORAL_SOLUTION | Freq: Every evening | ORAL | Status: DC | PRN

## 2023-12-27 MED ORDER — VITAMIN B-12 1000 MCG PO TABS
1000.0000 ug | ORAL_TABLET | Freq: Every day | ORAL | Status: DC
  Administered 2023-12-27 – 2023-12-31 (×8): 1000 ug via ORAL
  Filled 2023-12-27 (×5): qty 1

## 2023-12-27 MED ORDER — ONDANSETRON HCL 4 MG PO TABS
4.0000 mg | ORAL_TABLET | Freq: Four times a day (QID) | ORAL | Status: DC | PRN
Start: 2023-12-27 — End: 2023-12-29

## 2023-12-27 MED ORDER — AMPHETAMINE-DEXTROAMPHETAMINE 10 MG PO TABS: 5.0000 mg | ORAL_TABLET | Freq: Every day | ORAL | Status: DC | PRN

## 2023-12-27 NOTE — ED Notes (Signed)
 PT states she had been hitting her call bell for a long time & no one answered. So she had to text her nurses to come help her to the restroom. EDT Mancel hit the call bell & it rang to nurse's station to ensure it was working properly. Pt independently ambulated to restroom & back to treatment room.

## 2023-12-27 NOTE — Plan of Care (Signed)
  Problem: Skin Integrity: Goal: Risk for impaired skin integrity will decrease Outcome: Progressing   Problem: Clinical Measurements: Goal: Ability to maintain clinical measurements within normal limits will improve Outcome: Progressing Goal: Will remain free from infection Outcome: Progressing Goal: Diagnostic test results will improve Outcome: Progressing Goal: Respiratory complications will improve Outcome: Progressing Goal: Cardiovascular complication will be avoided Outcome: Progressing   Problem: Activity: Goal: Risk for activity intolerance will decrease Outcome: Progressing   Problem: Elimination: Goal: Will not experience complications related to bowel motility Outcome: Progressing Goal: Will not experience complications related to urinary retention Outcome: Progressing   Problem: Safety: Goal: Ability to remain free from injury will improve Outcome: Progressing   Problem: Pain Managment: Goal: General experience of comfort will improve and/or be controlled Outcome: Progressing   Problem: Skin Integrity: Goal: Risk for impaired skin integrity will decrease Outcome: Progressing

## 2023-12-27 NOTE — Progress Notes (Signed)
 [id number] Informed on-call Dr. GEANNIE Sor, MD that patient is requesting to have Flexeril  5mg  tab. According to patient she is taking this medication at home. Flexeril  5mg  tablet one dose for now with cosign order as instructed by him.
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
## 2023-12-27 NOTE — Consult Note (Signed)
 Assessment : 68 year old lady with history of knee surgery, benign thyroid  nodule, hysterectomy who works as a Immunologist.  For the past few months she has been having icepick headaches all over her head but did not think much of it.  Recently, she was about to go to work and felt very lightheaded and stayed at home.  After that she was recommended to get some medical evaluation and she went to the emergency room where she had this done however no imaging of the head was performed.  She was also seen prior to this by one of her other physicians who ordered an MR angiogram to rule out another cause for her headaches and this was read as negative.  Eventually her primary care doctor recommended getting an MRI scan and this was done without contrast which suggested edema in the right posterior temporal lobe and subsequently the patient was sent to the emergency room to get an MRI of the brain with and without contrast.  According to her family, her boss noticed that in the past few weeks she was not acting like herself and was more hesitant to be able to handle the amount of work which previously was never a problem for her.  Patient has a significant support system with multiple siblings and when I talked to her today we were accompanied by her sister.  I was asked to see her last night per the request of the patient's sister.  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  We went over the differential diagnosis of this and I talked about the options of doing nothing versus biopsy versus resection and I went over the risks and benefits of all 3 options in layman's terms.  I told them that if she was my wife, I would recommend a resection.  I went over the reasoning for this.  I went over the details of the surgery with him and explained to them in steps what this would  entail.  Given the location, I told him about the risk of infection, hemorrhage, stroke, paralysis, worsening edema [can be seen if the vein of Labbe is involved and still patent and if compromise can result in swelling of the temporal lobe], seizures and death among others.  We talked about the perioperative phase and I explained to them that tissue diagnosis is going to determine if in which adjuvant therapy she will need.  We talked about home care among others.  In the end she decided that she wants to have the surgery done as soon as possible and I will schedule her for a RIGHT craniotomy for tumor to be done this coming Monday.  I have communicated with the admitting team and I will call them tomorrow again to go over any questions that they might have.   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   Social Drivers of Health   Financial Resource Strain: Low Risk  (06/13/2023)   Overall Financial Resource Strain (CARDIA)    Difficulty of Paying Living Expenses: Not hard at all  Food Insecurity: No Food Insecurity (12/27/2023)   Hunger Vital Sign    Worried About Running Out of Food in the Last Year: Never true    Ran Out of Food in the Last Year: Never true  Transportation Needs: No Transportation  Needs (12/27/2023)   PRAPARE - Administrator, Civil Service (Medical): No    Lack of Transportation (Non-Medical): No  Physical Activity: Insufficiently Active (06/13/2023)   Exercise Vital Sign    Days of Exercise per Week: 1 day    Minutes of Exercise per Session: 30 min  Stress: No Stress Concern Present (06/13/2023)   [employer] of Occupational Health - Occupational Stress Questionnaire    Feeling of Stress : Only a little  Social Connections: Moderately Isolated (12/27/2023)   Social Connection and Isolation Panel    Frequency of Communication with Friends and Family: More than three times a week    Frequency of Social Gatherings with Friends and Family: Once a week    Attends Religious Services: More than 4 times per year    Active Member of [employer] or Organizations: No    Attends Banker Meetings: Never    Marital Status: Divorced  Catering manager Violence: Not At Risk (12/27/2023)   Humiliation, Afraid, Rape, and Kick questionnaire    Fear of Current or Ex-Partner: No    Emotionally Abused: No    Physically Abused: No    Sexually Abused: No    Family History  Problem Relation Age of Onset   Coronary artery disease Mother    Hypertension Mother    Heart disease Mother    Heart attack Mother    Diabetes Mother    Hyperlipidemia Mother    Kidney disease Mother    Depression Mother    Sleep apnea Mother    Obesity Mother    Heart attack Father    Heart disease Father    Sudden death Father    Hypertension Sister    Hypertension Brother    Colon cancer Neg Hx    Colon polyps Neg Hx    Esophageal cancer Neg Hx    Rectal cancer Neg Hx    Stomach cancer Neg Hx     Allergies  Allergen Reactions   Ace Inhibitors Other (See Comments)    coughing   Dust Mite Extract Other (See Comments)    Sneezing and congestion   Oxycodone  Hcl Other (See Comments) and Nausea And Vomiting    NDC Rniz:[id number]   Oxycodone -Acetaminophen  Nausea And  Vomiting   Tree Extract Other (See Comments)    Tree pollen and scrub pollen    Vicodin [Hydrocodone-Acetaminophen ] Nausea And Vomiting   Victoza [Liraglutide] Other (See Comments)    pancreatitis   Adhesive [Tape] Rash    Not on stomach    Past Medical History:  Diagnosis Date   ADD (attention deficit disorder)    Allergy    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HCC)    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  lasted about 6 hours, Echo did not show anything   Bilateral swelling of feet    Cholelithiasis    Chronic depressive disorder    Chronic diastolic CHF (congestive heart failure) (HCC)    Circadian rhythm sleep disorder, shift work    DDD (degenerative disc disease), 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   Diabetes (HCC)    Diverticulosis    Minimal   Fatty liver    GERD (gastroesophageal reflux disease)    Heart murmur    Hepatic steatosis    History of hiatal hernia 2017   Small   HLA B27 (HLA B27 positive)    Hx of pancreatitis    Hyperlipidemia    Hypertension    IBS (irritable bowel syndrome)    Ischemic colitis (HCC)    Lactose intolerance    Lumbar scoliosis 2010   Mild   Migraine headache without aura 05/10/2015   Multiple thyroid  nodules    Nephrolithiasis 2014   Left, minimal   Obesity due to excess calories with serious comorbidity 05/24/2019   Palpitations    Prediabetes    Presbyopia    Right bundle branch block    Sepsis (HCC) 01/2016   Sleep apnea    Sleep hypopnea    Vitamin D  deficiency     Past Surgical History:  Procedure Laterality Date   BREAST BIOPSY Left    CERVICAL CONE BIOPSY     CHOLECYSTECTOMY N/A 10/26/2014   Procedure: LAPAROSCOPIC CHOLECYSTECTOMY;  Surgeon: Vicenta Poli, MD;  Location: [facility];  Service: General;  Laterality: N/A;   COLONOSCOPY W/ BIOPSIES  multiple   CYSTOSCOPY WITH RETROGRADE PYELOGRAM, URETEROSCOPY AND STENT PLACEMENT Left 06/26/2018   Procedure: CYSTOSCOPY WITH RETROGRADE PYELOGRAM, URETEROSCOPY AND STENT PLACEMENT;   Surgeon: Alvaro Hummer, MD;  Location: [facility] [place];  Service: Urology;  Laterality: Left;   DIAGNOSTIC LAPAROSCOPY     DILATION AND CURETTAGE OF UTERUS  11/2009   DUPUYTREN CONTRACTURE RELEASE Left    x 3 , Dr Camella   ESOPHAGOGASTRODUODENOSCOPY  multiple   EYE SURGERY     HOLMIUM LASER APPLICATION Left 06/26/2018   Procedure: HOLMIUM LASER APPLICATION;  Surgeon: Alvaro Hummer, MD;  Location: [facility];  Service: Urology;  Laterality: Left;   KNEE ARTHROSCOPY Left 11/2008   LAPAROSCOPIC VAGINAL HYSTERECTOMY WITH SALPINGO OOPHORECTOMY Bilateral 10/03/2015   Procedure: LAPAROSCOPIC ASSISTED VAGINAL HYSTERECTOMY WITH SALPINGECTOMY;  Surgeon: Shanda SHAUNNA Muscat, MD;  Location: [facility];  Service: Gynecology;  Laterality: Bilateral;   LASIK  1990   RK   LEFT FINGER SURGERY     LIVER BIOPSY     MASS EXCISION Left 12/24/2012   Procedure: LEFT EXCISION OF PALMAR MASS X TWO;  Surgeon: Elsie Camella, MD;  Location: [facility];  Service: Orthopedics;  Laterality: Left;   PHOTOREFRACTIVE KERATOTOMY  1994   RADIAL KERATOTOMY  1994   TRANSOBTURATOR SLING       Physical Exam HENT:     Head: Normocephalic.     Nose: Nose normal.  Eyes:     Pupils: Pupils are equal, round, and reactive to light.  Cardiovascular:     Rate and Rhythm: Normal rate.  Pulmonary:     Effort: Pulmonary effort is normal.  Abdominal:     General: Abdomen is flat.  Musculoskeletal:     Cervical back: Normal range of motion.  Neurological:     Mental Status: She is alert.     Cranial Nerves: Cranial nerves 2-12 are intact.  Sensory: Sensation is intact.     Motor: Motor function is intact.     Comments: Visual fields intact. I did not check gait.        Results for orders placed or performed during the hospital encounter of 12/26/23  MR BRAIN W WO CONTRAST   Narrative   EXAM: MRI BRAIN WITH AND WITHOUT CONTRAST 12/26/2023 06:38:00  PM  TECHNIQUE: Multiplanar multisequence MRI of the head/brain was performed with and without the administration of intravenous contrast.  COMPARISON: MRI brain 12/26/23  CLINICAL HISTORY: Headache, new onset (Age >= 51y); stealth protocol. Right posterior temporal lobe mass, possibly extra-axial. There is associated edema in the right temporal and parietal lobes.  FINDINGS:  BRAIN AND VENTRICLES: No acute infarct. No acute intracranial hemorrhage. The previously described mass in the posterior right temporal lobe demonstrates avid peripheral enhancement with central necrosis. The mass measures up to 4.5 x 3.9 cm on axial image 64, series 6. There is some associated dural enhancement extending anteriorly along the right temporal convexity. Unchanged surrounding vasogenic edema and 10 mm of leftward midline shift. Unchanged right uncal herniation and downward transtentorial herniation.  ORBITS: No acute abnormality.  SINUSES: No acute abnormality.  BONES AND SOFT TISSUES: Normal bone marrow signal and enhancement. No acute soft tissue abnormality.  IMPRESSION: 1. Posterior right temporal lobe mass with avid peripheral enhancement and central necrosis, measuring up to 4.5 x 3.9 cm, with associated dural enhancement extending anteriorly along the right temporal convexity. 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 2. Unchanged surrounding vasogenic edema, 10 mm of leftward midline shift, right uncal herniation, and downward transtentorial herniation.  Electronically signed by: Ryan Chess MD 12/26/2023 07:01 PM EDT RP Workstation: [id number]   MR BRAIN WO CONTRAST   Addendum: 12/26/2023    ADDENDUM: Findings discussed with Tinnie Matter, PA at 4:06 PM on 12/26/23.  ----------------------------------------------------  Electronically signed by: Donnice Mania MD 12/26/2023 04:17 PM EDT RP Workstation: [id number]     Narrative    ORIGINAL REPORT   EXAM: MRI BRAIN WITHOUT CONTRAST 12/26/2023 03:37:44 PM  TECHNIQUE: Multiplanar multisequence MRI of the head/brain was performed without the administration of intravenous contrast.  COMPARISON: MRA head dated 12/02/2023.  CLINICAL HISTORY: Headache, new onset (Age >= 51y). Headache; Dizziness. Confusion.  FINDINGS:  BRAIN AND VENTRICLES: There is a heterogeneous mass along the lateral aspect of the right temporal occipital lobes which measures approximately 4.0 x 3.3 x 3.5 cm with surrounding vasogenic edema within the right temporal lobe extending into the lateral aspect of the right occipital lobe and the inferior right parietal lobe. There is associated mass effect and sulcal effacement with approximately 1.1 cm leftward midline shift. There is a possible CSF cleft around the mass most apparent on series 13 image 12 which may suggest extraaxial lesion. The mass is relatively heterogeneous with multiple internal flow voids visualized. Additional focus of edema involving the anterolateral cortex of the right temporal lobe without restricted diffusion to suggest infarct. There is no evidence of acute infarct or acute hemorrhage. Nonspecific scattered foci of T2/FLAIR intensity in the periventricular and subcortical white matter. Edema also noted extending into the posterolateral right internal capsule as well as into the right thalamus. There is crowding of the ambient cisterns more so on the right and crowding of the right aspect of the quadrigeminal cistern. There is also focal edema noted involving the right optic tract seen on series 10 image 21. Partial effacement of the right lateral ventricle.  ORBITS:  Prominence of CSF spaces within the left optic nerve sheath along the posterior aspect of the left globe.  SINUSES AND MASTOIDS: No acute abnormality.  BONES AND SOFT TISSUES: Normal marrow signal. No acute soft tissue abnormality.  IMPRESSION: 1. Heterogeneous  mass along the lateral aspect of the right temporal occipital lobes measuring approximately 4.0 x 3.3 x 3.5 cm, with surrounding vasogenic edema, mass effect, and 1.1 cm leftward midline shift. 2. CSF cleft suggesting possible extra-axial mass lesion. 3. MRI with contrast is recommended for further evaluation. 4. Edema extends into the right optic tract. Prominence of CSF spaces in the left optic nerve sheath posterior to the left globe. Recommend correlation with fundoscopic exam. 5. No evidence of acute infarct or hemorrhage.  Electronically signed by: Donnice Mania MD 12/26/2023 03:53 PM EDT RP Workstation: [id number]   Results for orders placed or performed in visit on 12/02/23  MR ANGIO HEAD WO CONTRAST   Narrative     [facility] [address], [address] [place], [place] [address] [phone]  NEUROIMAGING REPORT   STUDY DATE: 12/02/2023 PATIENT NAME: MACKENZEY CROWNOVER DOB: [birth year] MRN: [id number]  ORDERING CLINICIAN: Dr. Chalice CLINICAL HISTORY: 68 year old patient being evaluated for icepick  headaches COMPARISON FILMS: None EXAM: MR angiogram of the brain without contrast TECHNIQUE: 2D and 3D time-of-flight images were obtained and constructed  to great angiographic effect of the vessels of the intracranial  circulation of the circle of Willis CONTRAST: None IMAGING SITE: [facility] at [address]  FINDINGS:  Both internal carotid arteries demonstrate normal flow and caliber in the  petrous, cavernous and terminal supraclinoid segments.  Both middle and  anterior cerebral arteries appear normal.  Both vertebral arteries  contribute to the basilar arteries of the left vertebral artery appears to  be dominant and the right vertebral artery appears to be hypoplastic in  the coronal segment..  Basilar artery shows normal flow and caliber.  The  P1 segment of the left posterior cerebral artery is hypoplastic and this  vessel fills predominantly  from the anterior circulation suggesting  persistent fetal pattern of origin.  There are no definite visualized  aneurysms or aneurysm smaller than 2 mm in size are not adequately  visualized on MRA due to technical limitations.       Impression   MR angiogram study of the brain shows no significant narrowing  of the large and medium size intracranial vessels.  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    INTERPRETING PHYSICIAN:  EATHER POPP, MD Certified in  Neuroimaging by American Society of Neuroimaging and [employer]

## 2023-12-28 DIAGNOSIS — E1169 Type 2 diabetes mellitus with other specified complication: Secondary | ICD-10-CM | POA: Diagnosis not present

## 2023-12-28 DIAGNOSIS — E1159 Type 2 diabetes mellitus with other circulatory complications: Secondary | ICD-10-CM | POA: Diagnosis not present

## 2023-12-28 DIAGNOSIS — E119 Type 2 diabetes mellitus without complications: Secondary | ICD-10-CM | POA: Diagnosis not present

## 2023-12-28 DIAGNOSIS — R900 Intracranial space-occupying lesion found on diagnostic imaging of central nervous system: Secondary | ICD-10-CM | POA: Diagnosis not present

## 2023-12-28 LAB — CBC WITH DIFFERENTIAL/PLATELET
Abs Immature Granulocytes: 0.09 K/uL — ABNORMAL HIGH (ref 0.00–0.07)
Basophils Absolute: 0 K/uL (ref 0.0–0.1)
Basophils Relative: 0 %
Eosinophils Absolute: 0 K/uL (ref 0.0–0.5)
Eosinophils Relative: 0 %
HCT: 45.1 % (ref 36.0–46.0)
Hemoglobin: 14.5 g/dL (ref 12.0–15.0)
Immature Granulocytes: 1 %
Lymphocytes Relative: 7 %
Lymphs Abs: 1.1 K/uL (ref 0.7–4.0)
MCH: 26.7 pg (ref 26.0–34.0)
MCHC: 32.2 g/dL (ref 30.0–36.0)
MCV: 83.1 fL (ref 80.0–100.0)
Monocytes Absolute: 0.5 K/uL (ref 0.1–1.0)
Monocytes Relative: 3 %
Neutro Abs: 15 K/uL — ABNORMAL HIGH (ref 1.7–7.7)
Neutrophils Relative %: 89 %
Platelets: 340 K/uL (ref 150–400)
RBC: 5.43 MIL/uL — ABNORMAL HIGH (ref 3.87–5.11)
RDW: 12.6 % (ref 11.5–15.5)
WBC: 16.7 K/uL — ABNORMAL HIGH (ref 4.0–10.5)
nRBC: 0 % (ref 0.0–0.2)

## 2023-12-28 LAB — GLUCOSE, CAPILLARY
Glucose-Capillary: 156 mg/dL — ABNORMAL HIGH (ref 70–99)
Glucose-Capillary: 185 mg/dL — ABNORMAL HIGH (ref 70–99)
Glucose-Capillary: 178 mg/dL — ABNORMAL HIGH (ref 70–99)
Glucose-Capillary: 204 mg/dL — ABNORMAL HIGH (ref 70–99)

## 2023-12-28 LAB — BASIC METABOLIC PANEL WITH GFR
Anion gap: 10 (ref 5–15)
BUN: 14 mg/dL (ref 8–23)
CO2: 22 mmol/L (ref 22–32)
Calcium: 9.2 mg/dL (ref 8.9–10.3)
Chloride: 107 mmol/L (ref 98–111)
Creatinine, Ser: 0.68 mg/dL (ref 0.44–1.00)
GFR, Estimated: >60 mL/min (ref >60)
Glucose, Bld: 166 mg/dL — ABNORMAL HIGH (ref 70–99)
Potassium: 4.2 mmol/L (ref 3.5–5.1)
Sodium: 139 mmol/L (ref 135–145)

## 2023-12-28 MED ORDER — ZOLPIDEM TARTRATE 5 MG PO TABS: 2.5000 mg | ORAL_TABLET | Freq: Once | ORAL | Status: DC

## 2023-12-28 MED ORDER — DEXAMETHASONE SODIUM PHOSPHATE 4 MG/ML IJ SOLN
4.0000 mg | Freq: Four times a day (QID) | INTRAMUSCULAR | Status: AC
  Administered 2023-12-28 – 2023-12-29 (×5): 4 mg via INTRAVENOUS
  Filled 2023-12-28 (×4): qty 1

## 2023-12-28 MED ORDER — TRAZODONE HCL 50 MG PO TABS
25.0000 mg | ORAL_TABLET | Freq: Once | ORAL | Status: AC
  Administered 2023-12-28: 25 mg via ORAL
  Filled 2023-12-28: qty 1

## 2023-12-28 MED ORDER — DEXAMETHASONE SODIUM PHOSPHATE 4 MG/ML IJ SOLN: 4.0000 mg | Freq: Four times a day (QID) | INTRAMUSCULAR | Status: DC

## 2023-12-28 MED ORDER — SODIUM CHLORIDE 0.9 % IV SOLN
1.0000 g | INTRAVENOUS | Status: AC
  Administered 2023-12-28 – 2023-12-30 (×5): 1 g via INTRAVENOUS
  Filled 2023-12-28 (×3): qty 10
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
## 2023-12-28 NOTE — Progress Notes (Signed)
 Patient was able to place herself on her home CPAP for the night.

## 2023-12-28 NOTE — Plan of Care (Signed)
  Problem: Clinical Measurements: Goal: Ability to maintain clinical measurements within normal limits will improve Outcome: Progressing Goal: Will remain free from infection Outcome: Progressing Goal: Diagnostic test results will improve Outcome: Progressing Goal: Respiratory complications will improve Outcome: Progressing Goal: Cardiovascular complication will be avoided Outcome: Progressing   Problem: Activity: Goal: Risk for activity intolerance will decrease Outcome: Progressing   Problem: Coping: Goal: Level of anxiety will decrease Outcome: Progressing   Problem: Nutrition: Goal: Adequate nutrition will be maintained Outcome: Progressing   Problem: Elimination: Goal: Will not experience complications related to bowel motility Outcome: Progressing Goal: Will not experience complications related to urinary retention Outcome: Progressing   Problem: Pain Managment: Goal: General experience of comfort will improve and/or be controlled Outcome: Progressing   Problem: Safety: Goal: Ability to remain free from injury will improve Outcome: Progressing   Problem: Skin Integrity: Goal: Risk for impaired skin integrity will decrease Outcome: Progressing

## 2023-12-29 ENCOUNTER — Inpatient Hospital Stay (HOSPITAL_COMMUNITY): Payer: Self-pay | Admitting: Anesthesiology

## 2023-12-29 ENCOUNTER — Inpatient Hospital Stay (HOSPITAL_COMMUNITY)

## 2023-12-29 ENCOUNTER — Other Ambulatory Visit: Payer: Self-pay

## 2023-12-29 ENCOUNTER — Encounter (HOSPITAL_COMMUNITY)
Admission: EM | Disposition: A | Discharge status: Home / Self Care | Payer: Self-pay | Source: Home / Self Care | Attending: Internal Medicine

## 2023-12-29 ENCOUNTER — Encounter (HOSPITAL_COMMUNITY): Payer: Self-pay | Admitting: Internal Medicine

## 2023-12-29 DIAGNOSIS — I5032 Chronic diastolic (congestive) heart failure: Secondary | ICD-10-CM | POA: Diagnosis not present

## 2023-12-29 DIAGNOSIS — E119 Type 2 diabetes mellitus without complications: Secondary | ICD-10-CM | POA: Diagnosis not present

## 2023-12-29 DIAGNOSIS — D496 Neoplasm of unspecified behavior of brain: Secondary | ICD-10-CM | POA: Diagnosis present

## 2023-12-29 DIAGNOSIS — I110 Hypertensive heart disease with heart failure: Secondary | ICD-10-CM

## 2023-12-29 DIAGNOSIS — C719 Malignant neoplasm of brain, unspecified: Secondary | ICD-10-CM

## 2023-12-29 HISTORY — PX: APPLICATION OF CRANIAL NAVIGATION: SHX6578

## 2023-12-29 HISTORY — PX: CRANIOTOMY: SHX93

## 2023-12-29 LAB — TYPE AND SCREEN
ABO/RH(D): AB POS
Antibody Screen: NEGATIVE

## 2023-12-29 LAB — GLUCOSE, CAPILLARY
Glucose-Capillary: 193 mg/dL — ABNORMAL HIGH (ref 70–99)
Glucose-Capillary: 156 mg/dL — ABNORMAL HIGH (ref 70–99)
Glucose-Capillary: 158 mg/dL — ABNORMAL HIGH (ref 70–99)
Glucose-Capillary: 181 mg/dL — ABNORMAL HIGH (ref 70–99)

## 2023-12-29 LAB — HEMOGLOBIN A1C
Hgb A1c MFr Bld: 5.9 % — ABNORMAL HIGH (ref 4.8–5.6)
Hgb A1c MFr Bld: 6 % — ABNORMAL HIGH (ref 4.8–5.6)
Mean Plasma Glucose: 123 mg/dL
Mean Plasma Glucose: 126 mg/dL

## 2023-12-29 LAB — MRSA NEXT GEN BY PCR, NASAL: MRSA by PCR Next Gen: NOT DETECTED

## 2023-12-29 LAB — ABO/RH: ABO/RH(D): AB POS

## 2023-12-29 SURGERY — CRANIOTOMY TUMOR EXCISION
Anesthesia: General | Site: Head | Laterality: Right

## 2023-12-29 MED ORDER — SODIUM CHLORIDE 0.9 % IV SOLN
INTRAVENOUS | Status: DC | PRN
  Administered 2023-12-29 (×2): 1000 mg via INTRAVENOUS

## 2023-12-29 MED ORDER — EPHEDRINE SULFATE-NACL 50-0.9 MG/10ML-% IV SOSY
PREFILLED_SYRINGE | INTRAVENOUS | Status: DC | PRN
  Administered 2023-12-29 (×6): 5 mg via INTRAVENOUS

## 2023-12-29 MED ORDER — CLEVIDIPINE BUTYRATE 0.5 MG/ML IV EMUL
0.0000 mg/h | INTRAVENOUS | Status: DC
  Administered 2023-12-29 (×2): 4 mg/h via INTRAVENOUS

## 2023-12-29 MED ORDER — LIDOCAINE-EPINEPHRINE 1 %-1:100000 IJ SOLN
INTRAMUSCULAR | Status: DC | PRN
  Administered 2023-12-29 (×2): 10 mL

## 2023-12-29 MED ORDER — 0.9 % SODIUM CHLORIDE (POUR BTL) OPTIME
TOPICAL | Status: DC | PRN
  Administered 2023-12-29 (×2): 3000 mL

## 2023-12-29 MED ORDER — FENTANYL CITRATE (PF) 100 MCG/2ML IJ SOLN
INTRAMUSCULAR | Status: AC
Start: 2023-12-29 — End: 2023-12-29
  Filled 2023-12-29: qty 2

## 2023-12-29 MED ORDER — CEFAZOLIN SODIUM 1 G IJ SOLR
INTRAMUSCULAR | Status: AC
  Filled 2023-12-29: qty 20

## 2023-12-29 MED ORDER — CHLORHEXIDINE GLUCONATE CLOTH 2 % EX PADS
6.0000 | MEDICATED_PAD | Freq: Every day | CUTANEOUS | Status: DC
  Administered 2023-12-29 – 2023-12-31 (×4): 6 via TOPICAL

## 2023-12-29 MED ORDER — METOPROLOL SUCCINATE ER 50 MG PO TB24
50.0000 mg | ORAL_TABLET | Freq: Every day | ORAL | Status: DC
  Administered 2023-12-30 – 2023-12-31 (×4): 50 mg via ORAL
  Filled 2023-12-29 (×2): qty 1

## 2023-12-29 MED ORDER — PROPOFOL 10 MG/ML IV BOLUS
INTRAVENOUS | Status: DC | PRN
  Administered 2023-12-29 (×2): 150 mg via INTRAVENOUS

## 2023-12-29 MED ORDER — GADOBUTROL 1 MMOL/ML IV SOLN
10.0000 mL | Freq: Once | INTRAVENOUS | Status: AC | PRN
  Administered 2023-12-29 (×2): 10 mL via INTRAVENOUS

## 2023-12-29 MED ORDER — EPHEDRINE 5 MG/ML INJ
INTRAVENOUS | Status: AC
  Filled 2023-12-29: qty 20

## 2023-12-29 MED ORDER — BACLOFEN 10 MG PO TABS
10.0000 mg | ORAL_TABLET | Freq: Two times a day (BID) | ORAL | Status: DC
  Administered 2023-12-30 – 2023-12-31 (×6): 10 mg via ORAL
  Filled 2023-12-29 (×3): qty 1

## 2023-12-29 MED ORDER — BACITRACIN ZINC 500 UNIT/GM EX OINT
TOPICAL_OINTMENT | CUTANEOUS | Status: AC
  Filled 2023-12-29: qty 28.35

## 2023-12-29 MED ORDER — ACETAMINOPHEN 325 MG PO TABS: 650.0000 mg | ORAL_TABLET | ORAL | Status: DC | PRN

## 2023-12-29 MED ORDER — ONDANSETRON HCL 4 MG/2ML IJ SOLN
INTRAMUSCULAR | Status: DC | PRN
  Administered 2023-12-29 (×2): 4 mg via INTRAVENOUS

## 2023-12-29 MED ORDER — ONDANSETRON HCL 4 MG PO TABS: 4.0000 mg | ORAL_TABLET | ORAL | Status: DC | PRN

## 2023-12-29 MED ORDER — LEVETIRACETAM (KEPPRA) 500 MG/5 ML ADULT IV PUSH
500.0000 mg | Freq: Two times a day (BID) | INTRAVENOUS | Status: DC
  Administered 2023-12-29 – 2023-12-30 (×4): 500 mg via INTRAVENOUS
  Filled 2023-12-29 (×2): qty 5

## 2023-12-29 MED ORDER — SODIUM CHLORIDE 0.9 % IV SOLN: INTRAVENOUS | Status: DC | PRN

## 2023-12-29 MED ORDER — SODIUM CHLORIDE 0.9 % IV SOLN: INTRAVENOUS | Status: DC

## 2023-12-29 MED ORDER — POTASSIUM CHLORIDE IN NACL 40-0.9 MEQ/L-% IV SOLN
INTRAVENOUS | Status: AC
  Filled 2023-12-29 (×3): qty 1000

## 2023-12-29 MED ORDER — ROCURONIUM BROMIDE 10 MG/ML (PF) SYRINGE
PREFILLED_SYRINGE | INTRAVENOUS | Status: DC | PRN
  Administered 2023-12-29: 100 mg via INTRAVENOUS
  Administered 2023-12-29 (×4): 10 mg via INTRAVENOUS
  Administered 2023-12-29: 100 mg via INTRAVENOUS

## 2023-12-29 MED ORDER — FENTANYL CITRATE (PF) 250 MCG/5ML IJ SOLN
INTRAMUSCULAR | Status: AC
  Filled 2023-12-29: qty 5

## 2023-12-29 MED ORDER — CLEVIDIPINE BUTYRATE 0.5 MG/ML IV EMUL
INTRAVENOUS | Status: AC
  Filled 2023-12-29: qty 50

## 2023-12-29 MED ORDER — ROCURONIUM BROMIDE 10 MG/ML (PF) SYRINGE
PREFILLED_SYRINGE | INTRAVENOUS | Status: AC
  Filled 2023-12-29: qty 10

## 2023-12-29 MED ORDER — GLYCOPYRROLATE PF 0.2 MG/ML IJ SOSY
PREFILLED_SYRINGE | INTRAMUSCULAR | Status: DC | PRN
  Administered 2023-12-29 (×2): .2 mg via INTRAVENOUS

## 2023-12-29 MED ORDER — LEVETIRACETAM 500 MG/5ML IV SOLN
INTRAVENOUS | Status: AC
  Filled 2023-12-29: qty 10

## 2023-12-29 MED ORDER — MORPHINE SULFATE (PF) 2 MG/ML IV SOLN
1.0000 mg | INTRAVENOUS | Status: DC | PRN
  Administered 2023-12-29 – 2023-12-30 (×6): 2 mg via INTRAVENOUS
  Filled 2023-12-29 (×3): qty 1

## 2023-12-29 MED ORDER — PROMETHAZINE HCL 25 MG PO TABS: 12.5000 mg | ORAL_TABLET | ORAL | Status: DC | PRN

## 2023-12-29 MED ORDER — PROPOFOL 500 MG/50ML IV EMUL
INTRAVENOUS | Status: DC | PRN
  Administered 2023-12-29 (×2): 75 ug/kg/min via INTRAVENOUS

## 2023-12-29 MED ORDER — ORAL CARE MOUTH RINSE: 15.0000 mL | Freq: Once | OROMUCOSAL | Status: AC

## 2023-12-29 MED ORDER — PROPOFOL 10 MG/ML IV BOLUS
INTRAVENOUS | Status: AC
  Filled 2023-12-29: qty 20

## 2023-12-29 MED ORDER — ONDANSETRON HCL 4 MG/2ML IJ SOLN
4.0000 mg | INTRAMUSCULAR | Status: DC | PRN
Start: 2023-12-29 — End: 2023-12-31
  Filled 2023-12-29: qty 2

## 2023-12-29 MED ORDER — REMIFENTANIL HCL 2 MG IV SOLR
INTRAVENOUS | Status: AC
  Filled 2023-12-29: qty 2000

## 2023-12-29 MED ORDER — SODIUM CHLORIDE 0.9 % IV SOLN
INTRAVENOUS | Status: DC | PRN
  Administered 2023-12-29 (×2): .2 ug/kg/min via INTRAVENOUS

## 2023-12-29 MED ORDER — ACETAMINOPHEN 650 MG RE SUPP: 650.0000 mg | RECTAL | Status: DC | PRN

## 2023-12-29 MED ORDER — BUTALBITAL-APAP-CAFFEINE 50-325-40 MG PO TABS
2.0000 | ORAL_TABLET | ORAL | Status: DC | PRN
  Administered 2023-12-30 (×4): 2 via ORAL
  Filled 2023-12-29 (×2): qty 2

## 2023-12-29 MED ORDER — BACITRACIN ZINC 500 UNIT/GM EX OINT
TOPICAL_OINTMENT | CUTANEOUS | Status: DC | PRN
  Administered 2023-12-29 (×2): 1 via TOPICAL

## 2023-12-29 MED ORDER — PHENYLEPHRINE 80 MCG/ML (10ML) SYRINGE FOR IV PUSH (FOR BLOOD PRESSURE SUPPORT)
PREFILLED_SYRINGE | INTRAVENOUS | Status: AC
  Filled 2023-12-29: qty 10

## 2023-12-29 MED ORDER — CHLORHEXIDINE GLUCONATE 0.12 % MT SOLN
OROMUCOSAL | Status: AC
  Administered 2023-12-29 (×2): 15 mL via OROMUCOSAL
  Filled 2023-12-29: qty 15

## 2023-12-29 MED ORDER — LABETALOL HCL 5 MG/ML IV SOLN: 10.0000 mg | INTRAVENOUS | Status: DC | PRN

## 2023-12-29 MED ORDER — CHLORHEXIDINE GLUCONATE 0.12 % MT SOLN: 15.0000 mL | Freq: Once | OROMUCOSAL | Status: AC

## 2023-12-29 MED ORDER — ACETAMINOPHEN 10 MG/ML IV SOLN
INTRAVENOUS | Status: DC | PRN
Start: 2023-12-29 — End: 2023-12-29
  Administered 2023-12-29 (×2): 1000 mg via INTRAVENOUS

## 2023-12-29 MED ORDER — FENTANYL CITRATE (PF) 250 MCG/5ML IJ SOLN
INTRAMUSCULAR | Status: DC | PRN
  Administered 2023-12-29 (×2): 50 ug via INTRAVENOUS
  Administered 2023-12-29 (×2): 100 ug via INTRAVENOUS

## 2023-12-29 MED ORDER — PANTOPRAZOLE SODIUM 40 MG IV SOLR
40.0000 mg | Freq: Every day | INTRAVENOUS | Status: DC
  Administered 2023-12-29 (×2): 40 mg via INTRAVENOUS
  Filled 2023-12-29: qty 10

## 2023-12-29 MED ORDER — FENTANYL CITRATE (PF) 100 MCG/2ML IJ SOLN
25.0000 ug | INTRAMUSCULAR | Status: DC | PRN
  Administered 2023-12-29 (×2): 25 ug via INTRAVENOUS

## 2023-12-29 MED ORDER — CLEVIDIPINE BUTYRATE 0.5 MG/ML IV EMUL
INTRAVENOUS | Status: DC | PRN
  Administered 2023-12-29 (×2): 2 mg/h via INTRAVENOUS

## 2023-12-29 MED ORDER — ONDANSETRON HCL 4 MG/2ML IJ SOLN: 4.0000 mg | Freq: Once | INTRAMUSCULAR | Status: DC | PRN

## 2023-12-29 MED ORDER — DEXAMETHASONE SODIUM PHOSPHATE 10 MG/ML IJ SOLN
INTRAMUSCULAR | Status: DC | PRN
  Administered 2023-12-29 (×2): 10 mg via INTRAVENOUS

## 2023-12-29 MED ORDER — ORAL CARE MOUTH RINSE: 15.0000 mL | OROMUCOSAL | Status: DC | PRN

## 2023-12-29 MED ORDER — PROPOFOL 1000 MG/100ML IV EMUL
INTRAVENOUS | Status: AC
  Filled 2023-12-29: qty 100

## 2023-12-29 MED ORDER — PHENYLEPHRINE HCL-NACL 20-0.9 MG/250ML-% IV SOLN
INTRAVENOUS | Status: DC | PRN
  Administered 2023-12-29 (×2): 30 ug/min via INTRAVENOUS

## 2023-12-29 MED ORDER — LIDOCAINE 2% (20 MG/ML) 5 ML SYRINGE
INTRAMUSCULAR | Status: DC | PRN
  Administered 2023-12-29 (×2): 100 mg via INTRAVENOUS

## 2023-12-29 MED ORDER — DEXAMETHASONE SODIUM PHOSPHATE 4 MG/ML IJ SOLN
4.0000 mg | Freq: Four times a day (QID) | INTRAMUSCULAR | Status: DC
  Administered 2023-12-30 – 2023-12-31 (×14): 4 mg via INTRAVENOUS
  Filled 2023-12-29 (×7): qty 1

## 2023-12-29 MED ORDER — ACETAMINOPHEN 10 MG/ML IV SOLN
INTRAVENOUS | Status: AC
  Filled 2023-12-29: qty 100

## 2023-12-29 MED ORDER — CEFAZOLIN SODIUM-DEXTROSE 2-3 GM-%(50ML) IV SOLR
INTRAVENOUS | Status: DC | PRN
  Administered 2023-12-29 (×2): 2 g via INTRAVENOUS

## 2023-12-29 MED ORDER — CLEVIDIPINE BUTYRATE 0.5 MG/ML IV EMUL
0.0000 mg/h | INTRAVENOUS | Status: DC
  Administered 2023-12-30 (×2): 4 mg/h via INTRAVENOUS
  Filled 2023-12-29: qty 50

## 2023-12-29 MED ORDER — SUGAMMADEX SODIUM 200 MG/2ML IV SOLN
INTRAVENOUS | Status: DC | PRN
  Administered 2023-12-29 (×2): 200 mg via INTRAVENOUS

## 2023-12-29 MED ORDER — PHENYLEPHRINE 80 MCG/ML (10ML) SYRINGE FOR IV PUSH (FOR BLOOD PRESSURE SUPPORT)
PREFILLED_SYRINGE | INTRAVENOUS | Status: DC | PRN
  Administered 2023-12-29 (×2): 80 ug via INTRAVENOUS

## 2023-12-29 MED ORDER — HEMOSTATIC AGENTS (NO CHARGE) OPTIME
TOPICAL | Status: DC | PRN
  Administered 2023-12-29 (×2): 1 via TOPICAL

## 2023-12-29 MED ORDER — SURGIFLO WITH THROMBIN (HEMOSTATIC MATRIX KIT) OPTIME
TOPICAL | Status: DC | PRN
Start: 2023-12-29 — End: 2023-12-29
  Administered 2023-12-29 (×2): 1 via TOPICAL

## 2023-12-29 MED ORDER — LIDOCAINE-EPINEPHRINE 1 %-1:100000 IJ SOLN
INTRAMUSCULAR | Status: AC
Start: 2023-12-29 — End: 2023-12-29
  Filled 2023-12-29: qty 1

## 2023-12-29 MED ORDER — FENTANYL CITRATE PF 50 MCG/ML IJ SOSY: 25.0000 ug | PREFILLED_SYRINGE | INTRAMUSCULAR | Status: DC | PRN

## 2023-12-29 MED ORDER — CEFAZOLIN SODIUM-DEXTROSE 2-4 GM/100ML-% IV SOLN
2.0000 g | Freq: Three times a day (TID) | INTRAVENOUS | Status: DC
  Administered 2023-12-29 – 2023-12-30 (×4): 2 g via INTRAVENOUS
  Filled 2023-12-29 (×2): qty 100

## 2023-12-29 MED ORDER — GLYCOPYRROLATE PF 0.2 MG/ML IJ SOSY
PREFILLED_SYRINGE | INTRAMUSCULAR | Status: AC
  Filled 2023-12-29: qty 1

## 2023-12-29 SURGICAL SUPPLY — 66 items
BAG COUNTER SPONGE SURGICOUNT (BAG) ×2 IMPLANT
BNDG GAUZE DERMACEA FLUFF 4 (GAUZE/BANDAGES/DRESSINGS) ×2 IMPLANT
BUR ACORN 6.0 PRECISION (BURR) ×2 IMPLANT
BUR MATCHSTICK NEURO 3.0 LAGG (BURR) ×2 IMPLANT
BUR SPIRAL ROUTER 2.3 (BUR) ×2 IMPLANT
CANISTER SUCTION 3000ML PPV (SUCTIONS) ×2 IMPLANT
CASSETTE SUCT IRRIG SONOPET IQ (MISCELLANEOUS) IMPLANT
CHLORAPREP W/TINT 26 (MISCELLANEOUS) ×2 IMPLANT
CLIP TI MEDIUM 6 (CLIP) IMPLANT
CNTNR URN SCR LID CUP LEK RST (MISCELLANEOUS) ×2 IMPLANT
COVER MAYO STAND STRL (DRAPES) ×6 IMPLANT
DRAIN 1/8 RD END PERF LFSIL ST (DRAIN) IMPLANT
DRAPE MICROSCOPE LEICA (MISCELLANEOUS) ×2 IMPLANT
DRAPE NEUROLOGICAL W/INCISE (DRAPES) ×2 IMPLANT
DRAPE UTILITY 15X26 TOWEL STRL (DRAPES) ×4 IMPLANT
DRAPE WARM FLUID 44X44 (DRAPES) ×2 IMPLANT
DRSG TEGADERM 4X4.75 (GAUZE/BANDAGES/DRESSINGS) ×2 IMPLANT
DRSG TELFA 3X8 NADH STRL (GAUZE/BANDAGES/DRESSINGS) ×4 IMPLANT
ELECTRODE REM PT RTRN 9FT ADLT (ELECTROSURGICAL) ×2 IMPLANT
EVACUATOR SILICONE 100CC (DRAIN) IMPLANT
FEE COVERAGE SUPPORT O-ARM (MISCELLANEOUS) ×2 IMPLANT
FORCEPS BIPOLAR SPETZLER 8 1.0 (NEUROSURGERY SUPPLIES) ×2 IMPLANT
GAUZE 4X4 16PLY ~~LOC~~+RFID DBL (SPONGE) IMPLANT
GAUZE PAD ABD 8X10 STRL (GAUZE/BANDAGES/DRESSINGS) IMPLANT
GAUZE SPONGE 4X4 12PLY STRL (GAUZE/BANDAGES/DRESSINGS) IMPLANT
GLOVE BIOGEL M SZ8.5 STRL (GLOVE) ×4 IMPLANT
GLOVE EXAM NITRILE XL STR (GLOVE) IMPLANT
GOWN STRL REUS W/ TWL LRG LVL3 (GOWN DISPOSABLE) IMPLANT
GOWN STRL REUS W/ TWL XL LVL3 (GOWN DISPOSABLE) IMPLANT
GOWN STRL SURGICAL XL XLNG (GOWN DISPOSABLE) ×2 IMPLANT
GRAFT DURAGEN MATRIX 2WX2L IMPLANT
HEMOSTAT SURGICEL 2X14 (HEMOSTASIS) ×2 IMPLANT
KIT BASIN OR (CUSTOM PROCEDURE TRAY) ×2 IMPLANT
KIT TURNOVER KIT B (KITS) ×2 IMPLANT
MARKER SKIN DUAL TIP RULER LAB (MISCELLANEOUS) IMPLANT
MARKER SPHERE PSV REFLC NDI (MISCELLANEOUS) ×6 IMPLANT
NS IRRIG 1000ML POUR BTL (IV SOLUTION) ×2 IMPLANT
PACK CRANIOTOMY CUSTOM (CUSTOM PROCEDURE TRAY) ×2 IMPLANT
PATTIES SURGICAL .5 X.5 (GAUZE/BANDAGES/DRESSINGS) ×2 IMPLANT
PATTIES SURGICAL .5 X3 (DISPOSABLE) IMPLANT
PATTIES SURGICAL 1X1 (DISPOSABLE) ×2 IMPLANT
PIN MAYFIELD SKULL DISP (PIN) IMPLANT
PLATE BONE 12 2H TARGET XL (Plate) IMPLANT
POINTER NAVIGATION AXIEM (INSTRUMENTS) ×2 IMPLANT
POINTER TRACER AXIEM (INSTRUMENTS) IMPLANT
SCREW UNIII AXS SD 1.5X4 (Screw) IMPLANT
SOL PREP POV-IOD 4OZ 10% (MISCELLANEOUS) ×2 IMPLANT
SPECIMEN JAR SMALL (MISCELLANEOUS) IMPLANT
SPIKE FLUID TRANSFER (MISCELLANEOUS) ×2 IMPLANT
SPONGE NEURO XRAY DETECT 1X3 (DISPOSABLE) IMPLANT
SPONGE SURGIFOAM ABS GEL 100C (HEMOSTASIS) IMPLANT
STAPLER SKIN PROX 35W (STAPLE) ×2 IMPLANT
SURGIFLO W/THROMBIN 8M KIT (HEMOSTASIS) IMPLANT
SUT PROLENE 5 0 RB 2 (SUTURE) IMPLANT
SUT VIC AB 0 CT2 8-18 (SUTURE) ×4 IMPLANT
SYR 30ML SLIP (SYRINGE) ×2 IMPLANT
SYR CONTROL 10ML LL (SYRINGE) ×2 IMPLANT
TIP TISSUE SONOPET IQ STD 12 (TIP) IMPLANT
TOWEL GREEN STERILE (TOWEL DISPOSABLE) ×2 IMPLANT
TOWEL GREEN STERILE FF (TOWEL DISPOSABLE) ×2 IMPLANT
TRACKER ENT PATIENT (MISCELLANEOUS) IMPLANT
TRAY FOLEY MTR SLVR 14FR STAT (SET/KITS/TRAYS/PACK) IMPLANT
TRAY FOLEY MTR SLVR 16FR STAT (SET/KITS/TRAYS/PACK) IMPLANT
TUBE CONNECTING 20X1/4 (TUBING) IMPLANT
TUBING FEATHERFLOW (TUBING) IMPLANT
WATER STERILE IRR 1000ML POUR (IV SOLUTION) ×2 IMPLANT

## 2023-12-29 NOTE — Transfer of Care (Signed)
 Immediate Anesthesia Transfer of Care Note  Patient: Alexandra Walker  Procedure(s) Performed: CRANIOTOMY TUMOR EXCISION (Right: Head) COMPUTER-ASSISTED NAVIGATION, FOR CRANIAL PROCEDURE  Patient Location: PACU  Anesthesia Type:General  Level of Consciousness: awake, alert , and oriented  Airway & Oxygen  Therapy: Patient Spontanous Breathing and Patient connected to face mask oxygen   Post-op Assessment: Report given to RN and Post -op Vital signs reviewed and stable  Post vital signs: Reviewed and stable  Last Vitals:  Vitals Value Taken Time  BP 168/81 12/29/23 19:15  Temp    Pulse 69 12/29/23 19:17  Resp 21 12/29/23 19:17  SpO2 94 % 12/29/23 19:17  Vitals shown include unfiled device data.  Last Pain:  Vitals:   12/29/23 1556  TempSrc: Oral  PainSc: 2       Patients Stated Pain Goal: 0 (12/29/23 1556)  Complications: No notable events documented.

## 2023-12-29 NOTE — Anesthesia Preprocedure Evaluation (Signed)
 Anesthesia Evaluation  Patient identified by MRN, date of birth, ID band Patient awake    Reviewed: Allergy & Precautions, NPO status , Patient's Chart, lab work & pertinent test results, reviewed documented beta blocker date and time   History of Anesthesia Complications (+) PONV and history of anesthetic complications  Airway Mallampati: II  TM Distance: >3 FB     Dental no notable dental hx.    Pulmonary sleep apnea and Continuous Positive Airway Pressure Ventilation , neg COPD   breath sounds clear to auscultation       Cardiovascular hypertension, (-) angina +CHF  (-) CAD, (-) Past MI and (-) Cardiac Stents + dysrhythmias + Valvular Problems/Murmurs  Rhythm:Regular Rate:Normal     Neuro/Psych  Headaches PSYCHIATRIC DISORDERS  Depression       GI/Hepatic hiatal hernia,GERD  Medicated,,(+) neg Cirrhosis        Endo/Other  diabetes, Type 2    Renal/GU Renal disease     Musculoskeletal   Abdominal   Peds  Hematology   Anesthesia Other Findings   Reproductive/Obstetrics                              Anesthesia Physical Anesthesia Plan  ASA: 3  Anesthesia Plan: General   Post-op Pain Management:    Induction: Intravenous  PONV Risk Score and Plan: 2 and Ondansetron , Dexamethasone  and Propofol  infusion  Airway Management Planned: Oral ETT  Additional Equipment: Arterial line  Intra-op Plan:   Post-operative Plan: Extubation in OR  Informed Consent: I have reviewed the patients History and Physical, chart, labs and discussed the procedure including the risks, benefits and alternatives for the proposed anesthesia with the patient or authorized representative who has indicated his/her understanding and acceptance.     Dental advisory given  Plan Discussed with: CRNA  Anesthesia Plan Comments:         Anesthesia Quick Evaluation

## 2023-12-29 NOTE — Consult Note (Addendum)
 NAME:  Alexandra Walker, MRN:  [id number], DOB:  [birth year], LOS: 3 ADMISSION DATE:  12/26/2023, CONSULTATION DATE:  8/11 REFERRING MD:  Dr. Cherlyn, CHIEF COMPLAINT:  R brain tumor s/p crani w/ resection   History of Present Illness:  Patient is a 68 yo F w/ pertinent PMH HTN, HLD, T2DM, anti-phospholipid syndrome, ADHD, sleep disorder presents to [facility] on 8/8 w/ HA and dizziness.  Followed by neurologist Dr. Chalice outpt. Having worsening HA and dizziness. Came to [facility] on 8/8. 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 NSG consulted. Started on IV decadron . CT chest/abd/pelvis negative for metastatic disease. On 8/11 taken to OR for crani w/ tumor resection. Post op brought to neuro icu. Pccm consulted.  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   Diabetes (HCC)    Diverticulosis    Minimal   Fatty liver    GERD (gastroesophageal reflux disease)    Heart murmur    Hepatic steatosis    History of hiatal hernia 2017   Small   HLA B27 (HLA B27 positive)    Hx of pancreatitis    Hyperlipidemia    Hypertension    IBS (irritable bowel syndrome)    Ischemic colitis (HCC)    Lactose intolerance    Lumbar scoliosis 2010   Mild   Migraine headache without aura 05/10/2015   Multiple thyroid  nodules    Nephrolithiasis 2014   Left, minimal   Obesity due to excess calories with serious comorbidity 05/24/2019   Palpitations    PONV (postoperative nausea and vomiting)    Prediabetes    Presbyopia    Right bundle branch  block    Sepsis (HCC) 01/2016   Sleep apnea    Sleep hypopnea    Vitamin D  deficiency      Significant Hospital Events: Including procedures, antibiotic start and stop dates in addition to other pertinent events   8/8 large right brain tumor 8/11 s/p crani w/ tumor resection; brought to icu post op; pccm consulted  Interim History / Subjective:  Complaining of dull ache over the right eyebrow No other complaints  Objective    Blood pressure (!) 144/70, pulse (!) 56, temperature 98 F (36.7 C), temperature source Oral, resp. rate 12, height 5' 6 (1.676 m), weight 114.3 kg, last menstrual period 05/20/2008, SpO2 99%.        Intake/Output Summary (Last 24 hours) at 12/29/2023 1634 Last data filed at 12/29/2023 0600 Gross per 24 hour  Intake 210 ml  Output --  Net 210 ml   Filed Weights   12/26/23 1327  Weight: 114.3 kg    Examination: General: Middle aged female in NAD HENT: [place]/AT, PERRL, no JVD Lungs: Clear bilateral breath sounds Cardiovascular: RRR, no MRG Abdomen: Soft, NT, nD Extremities: No acute deformity or ROM limitation.  Neuro: Alert, oriented, non-focal. Moves all extremities with 5/5 strength.   Resolved problem list   Assessment and Plan    Posterior R temporal lobe mass -  s/p surgical resection 8/11. Full op note pending. Plan: - Post op care per NSGY. - Steroids. - F/u imaging per NSGY. - PRN tylenol  - PRN morphine  (declining fentanyl  due to nausea history) for severe pain - PRN zofran  - Clevidipine  infusion for SBP goal < 160 mmHg   Hx OSA. Plan: - Defer CPAP tonight with fresh crani - Pulse oximetry   Hx HTN, HLD. Plan: - Continue Cozaar , Metoprolol , Rosuvastatin , as long as safe to take PO. - Hold PTA ASA until ok with NSGY. - Hold PTA Furosemide  for now.   DMT2. Plan: - SSI and cbg monitoring - Hold PTA Tirzepatide    Possible UTI. Plan: - Continue CTX for now   R lobe thyroid  nodule. Plan: - Outpatient follow  up  Leukocytosis - steroids - monitor  Best Practice (right click and Reselect all SmartList Selections daily)   Diet/type: NPO DVT prophylaxis SCD Pressure ulcer(s): pressure ulcer assessment deferred  GI prophylaxis: PPI Lines: Arterial Line Foley:  N/A Code Status:  full code Last date of multidisciplinary goals of care discussion [ patient and sister updated]  Labs   CBC: Recent Labs  Lab 12/24/23 2121 12/26/23 1342 12/27/23 0344 12/28/23 0614  WBC 8.2 8.6 6.4 16.7*  NEUTROABS 5.9 5.9  --  15.0*  HGB 14.4 13.7 14.1 14.5  HCT 44.0 43.3 43.2 45.1  MCV 82.2 85.6 83.1 83.1  PLT 300 276 259 340    Basic Metabolic Panel: Recent Labs  Lab 12/24/23 2121 12/26/23 1342 12/27/23 0344 12/28/23 0614  NA 140 140 139 139  K 3.4* 4.0 4.0 4.2  CL 104 106 108 107  CO2 24 22 22 22   GLUCOSE 124* 127* 222* 166*  BUN 18 20 10 14   CREATININE 0.75 0.78 0.66 0.68  CALCIUM  9.9 9.4 9.1 9.2  MG  --  1.8  --   --    GFR: Estimated Creatinine Clearance: 90 mL/min (by C-G formula based on SCr of 0.68 mg/dL). Recent Labs  Lab 12/24/23 2121 12/26/23 1342 12/27/23 0344 12/28/23 0614  WBC 8.2 8.6 6.4 16.7*    Liver Function Tests: Recent Labs  Lab 12/24/23 2121 12/26/23 1342 12/27/23 0344  AST 23 20 20   ALT 20 20 24   ALKPHOS 100 77 71  BILITOT 0.5 0.3 0.4  PROT 7.2 6.4* 6.6  ALBUMIN 4.2 3.5 3.4*   No results for input(s): LIPASE, AMYLASE in the last 168 hours. No results for input(s): AMMONIA in the last 168 hours.  ABG No results found for: PHART, PCO2ART, PO2ART, HCO3, TCO2, ACIDBASEDEF, O2SAT   Coagulation Profile: No results for input(s): INR, PROTIME in the last 168 hours.  Cardiac Enzymes: No results for input(s): CKTOTAL, CKMB, CKMBINDEX, TROPONINI in the last 168 hours.  HbA1C: Hgb A1c MFr Bld  Date/Time Value Ref Range Status  12/28/2023 06:14 AM 6.0 (H) 4.8 - 5.6 % Final    Comment:    (NOTE)         Prediabetes:  5.7 - 6.4         Diabetes: >6.4         Glycemic control for adults with diabetes: <7.0   12/27/2023 03:44 AM 5.9 (H) 4.8 - 5.6 % Final    Comment:    (NOTE)         Prediabetes: 5.7 - 6.4         Diabetes: >6.4         Glycemic control for adults with diabetes: <7.0  CBG: Recent Labs  Lab 12/28/23 1228 12/28/23 1623 12/28/23 2109 12/29/23 0612 12/29/23 1113  GLUCAP 185* 178* 204* 193* 156*    Review of Systems:   Bolds are positive  Constitutional: weight loss, gain, night sweats, Fevers, chills, fatigue .  HEENT: headaches, Sore throat, sneezing, nasal congestion, post nasal drip, Difficulty swallowing, Tooth/dental problems, visual complaints visual changes, ear ache CV:  chest pain, radiates:,Orthopnea, PND, swelling in lower extremities, dizziness, palpitations, syncope.  GI  heartburn, indigestion, abdominal pain, nausea, vomiting, diarrhea, change in bowel habits, loss of appetite, bloody stools.  Resp: cough, productive: , hemoptysis, dyspnea, chest pain, pleuritic.  Skin: rash or itching or icterus GU: dysuria, change in color of urine, urgency or frequency. flank pain, hematuria  MS: joint pain or swelling. decreased range of motion  Psych: change in mood or affect. depression or anxiety.  Neuro: difficulty with speech, weakness, numbness, ataxia    Past Medical History:  She,  has a past medical history of ADD (attention deficit disorder), Allergy, 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HCC), Atrial bigeminy (07/2022), Bilateral swelling of feet, Cholelithiasis, Chronic depressive disorder, Chronic diastolic CHF (congestive heart failure) (HCC), Circadian rhythm sleep disorder, shift work, DDD (degenerative disc disease), lumbosacral (05/2012), Diabetes (HCC), Diverticulosis, Fatty liver, GERD (gastroesophageal reflux disease), Heart murmur, Hepatic steatosis, History of hiatal hernia (2017), HLA B27 (HLA B27 positive), pancreatitis, Hyperlipidemia, Hypertension, IBS  (irritable bowel syndrome), Ischemic colitis (HCC), Lactose intolerance, Lumbar scoliosis (2010), Migraine headache without aura (05/10/2015), Multiple thyroid  nodules, Nephrolithiasis (2014), Obesity due to excess calories with serious comorbidity (05/24/2019), Palpitations, PONV (postoperative nausea and vomiting), Prediabetes, Presbyopia, Right bundle branch block, Sepsis (HCC) (01/2016), Sleep apnea, Sleep hypopnea, and Vitamin D  deficiency.   Surgical History:   Past Surgical History:  Procedure Laterality Date   BREAST BIOPSY Left    CERVICAL CONE BIOPSY     CHOLECYSTECTOMY N/A 10/26/2014   Procedure: LAPAROSCOPIC CHOLECYSTECTOMY;  Surgeon: Vicenta Poli, MD;  Location: [facility];  Service: General;  Laterality: N/A;   COLONOSCOPY W/ BIOPSIES  multiple   CYSTOSCOPY WITH RETROGRADE PYELOGRAM, URETEROSCOPY AND STENT PLACEMENT Left 06/26/2018   Procedure: CYSTOSCOPY WITH RETROGRADE PYELOGRAM, URETEROSCOPY AND STENT PLACEMENT;  Surgeon: Alvaro Hummer, MD;  Location: [facility];  Service: Urology;  Laterality: Left;   DIAGNOSTIC LAPAROSCOPY     DILATION AND CURETTAGE OF UTERUS  11/2009   DUPUYTREN CONTRACTURE RELEASE Left    x 3 , Dr Camella   ESOPHAGOGASTRODUODENOSCOPY  multiple   EYE SURGERY     HOLMIUM LASER APPLICATION Left 06/26/2018   Procedure: HOLMIUM LASER APPLICATION;  Surgeon: Alvaro Hummer, MD;  Location: [facility];  Service: Urology;  Laterality: Left;   KNEE ARTHROSCOPY Left 11/2008   LAPAROSCOPIC VAGINAL HYSTERECTOMY WITH SALPINGO OOPHORECTOMY Bilateral 10/03/2015   Procedure: LAPAROSCOPIC ASSISTED VAGINAL HYSTERECTOMY WITH SALPINGECTOMY;  Surgeon: Shanda SHAUNNA Muscat, MD;  Location: [facility];  Service: Gynecology;  Laterality: Bilateral;   LASIK  1990   RK   LEFT FINGER SURGERY     LIVER BIOPSY     MASS EXCISION Left 12/24/2012   Procedure: LEFT EXCISION OF PALMAR MASS X TWO;  Surgeon: Elsie Camella, MD;  Location:  [facility];  Service: Orthopedics;  Laterality: Left;   PHOTOREFRACTIVE KERATOTOMY  1994   RADIAL KERATOTOMY  1994   TRANSOBTURATOR SLING       Social History:   reports that she has never smoked. She has never used smokeless tobacco. She reports current alcohol use. She reports  that she does not use drugs.   Family History:  Her family history includes Coronary artery disease in her mother; Depression in her mother; Diabetes in her mother; Heart attack in her father and mother; Heart disease in her father and mother; Hyperlipidemia in her mother; Hypertension in her brother, mother, and sister; Kidney disease in her mother; Obesity in her mother; Sleep apnea in her mother; Sudden death in her father. There is no history of Colon cancer, Colon polyps, Esophageal cancer, Rectal cancer, or Stomach cancer.   Allergies Allergies  Allergen Reactions   Ace Inhibitors Other (See Comments)    coughing   Dust Mite Extract Other (See Comments)    Sneezing and congestion   Oxycodone  Hcl Other (See Comments) and Nausea And Vomiting    NDC Rniz:[id number]   Oxycodone -Acetaminophen  Nausea And Vomiting   Tree Extract Other (See Comments)    Tree pollen and scrub pollen    Vicodin [Hydrocodone-Acetaminophen ] Nausea And Vomiting   Victoza [Liraglutide] Other (See Comments)    pancreatitis   Adhesive [Tape] Rash    Not on stomach     Home Medications  Prior to Admission medications   Medication Sig Start Date End Date Taking? Authorizing Provider  amphetamine -dextroamphetamine  (ADDERALL) 10 MG tablet Take 1 tablet (10 mg total) by mouth daily as needed. Patient taking differently: Take 5 mg by mouth daily as needed (For ADHD). 09/30/23  Yes [name], MD  aspirin  325 MG tablet Take 325 mg by mouth daily.   Yes [provider]  cholecalciferol  (VITAMIN D ) 1000 UNITS tablet Take 2,000 Units by mouth daily.    Yes [provider]  cyanocobalamin  (VITAMIN B12)  1000 MCG tablet Take 1,000 mcg by mouth daily.   Yes [provider]  estradiol  (ESTRACE ) 0.1 MG/GM vaginal cream Place 1 gram vaginally 2 (two) times a week. Patient taking differently: Place 1 g vaginally as needed. 01/02/23  Yes Cleotilde Ronal RAMAN, MD  fluticasone  (FLONASE ) 50 MCG/ACT nasal spray Place 1 spray into both nostrils in the morning and at bedtime. Patient taking differently: Place 1 spray into both nostrils 2 (two) times daily as needed for allergies. 03/28/22  Yes [name], MD  furosemide  (LASIX ) 40 MG tablet Take 1 tablet (40 mg total) by mouth daily. Patient taking differently: Take 40 mg by mouth daily as needed for fluid. 03/10/23  Yes Verlin Lonni BIRCH, MD  losartan  (COZAAR ) 100 MG tablet Take 1 tablet (100 mg total) by mouth daily. 09/29/23 09/28/24 Yes [name], MD  Lutein 6 MG CAPS Take 6 mg by mouth daily. 10/02/17  Yes [provider]  metoprolol  succinate (TOPROL -XL) 100 MG 24 hr tablet Take 1 tablet (100 mg total) by mouth daily with or immediately following a meal. 10/20/23  Yes [name], MD  mometasone  (ELOCON ) 0.1 % cream Apply 1 application topically 2 (two) times a week. Patient taking differently: Apply 1 Application topically as needed (rash). 01/02/23  Yes Cleotilde Ronal RAMAN, MD  Multiple Vitamin (MULTIVITAMIN WITH MINERALS) TABS tablet Take 1 tablet by mouth daily.   Yes [provider]  potassium chloride  SA (KLOR-CON  M) 20 MEQ tablet Take 1 tablet (20 mEq total) by mouth daily. Patient taking differently: Take 20 mEq by mouth as needed. 07/15/22  Yes Verlin Lonni BIRCH, MD  rosuvastatin  (CRESTOR ) 20 MG tablet Take 1 tablet (20 mg total) by mouth daily. 08/11/23 08/10/24 Yes [name], MD  tirzepatide  (MOUNJARO ) 10 MG/0.5ML Pen Inject  10 mg into the skin once a week. 06/13/23  Yes [name], MD  cephALEXin  (KEFLEX ) 500 MG capsule Take 1 capsule (500 mg total) by mouth 2 (two) times daily for 7 days. Patient not  taking: Reported on 12/26/2023 12/24/23 01/01/24  Barrett, Jamie N, PA-C  cycloSPORINE  (RESTASIS ) 0.05 % ophthalmic emulsion Place 1 drop into both eyes 2 (two) times daily. Patient not taking: Reported on 11/26/2023 07/21/23        Critical care time: 32 min      Deward Eastern, AGACNP-BC  Pulmonary & Critical Care  See Amion for personal pager PCCM on call pager [phone] until 7pm. Please call Elink 7p-7a. [phone]  12/29/2023 10:24 PM

## 2023-12-29 NOTE — Progress Notes (Signed)
 Hold metoprolol  due to pt's HR=50s. MD notified and aware.   Amado GORMAN Arabia, RN

## 2023-12-29 NOTE — H&P (Signed)
 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   Social Drivers of Health   Financial Resource Strain: Low Risk  (06/13/2023)   Overall Financial Resource Strain (CARDIA)    Difficulty of Paying Living Expenses: Not hard at all  Food Insecurity: No Food Insecurity (12/27/2023)   Hunger Vital Sign    Worried About Running Out of Food in the Last Year: Never true    Ran Out of Food in the Last Year: Never true  Transportation Needs: No Transportation Needs (12/27/2023)   PRAPARE - Administrator, Civil Service (Medical): No    Lack of Transportation (Non-Medical): No  Physical Activity: Insufficiently Active (06/13/2023)   Exercise Vital Sign    Days of Exercise per Week: 1 day    Minutes of Exercise per Session: 30 min  Stress: No Stress Concern Present (06/13/2023)   [employer] of Occupational Health - Occupational Stress Questionnaire    Feeling of Stress : Only a little  Social Connections: Moderately Isolated (12/27/2023)   Social Connection and Isolation Panel    Frequency of Communication with Friends and Family: More than three times a week    Frequency of Social Gatherings with Friends  and Family: Once a week    Attends Religious Services: More than 4 times per year    Active Member of [employer] or Organizations: No    Attends Banker Meetings: Never    Marital Status: Divorced  Catering manager Violence: Not At Risk (12/27/2023)   Humiliation, Afraid, Rape, and Kick questionnaire    Fear of Current or Ex-Partner: No    Emotionally Abused: No    Physically Abused: No    Sexually Abused: No    Family History  Problem Relation Age of Onset   Coronary artery disease Mother    Hypertension Mother    Heart disease Mother    Heart attack Mother    Diabetes Mother    Hyperlipidemia Mother    Kidney disease Mother    Depression Mother    Sleep apnea Mother    Obesity Mother    Heart attack Father    Heart disease Father    Sudden death Father    Hypertension Sister    Hypertension Brother    Colon cancer Neg Hx    Colon polyps Neg Hx    Esophageal cancer Neg Hx    Rectal cancer Neg Hx    Stomach cancer Neg Hx     Allergies  Allergen Reactions   Ace Inhibitors Other (See Comments)    coughing  Dust Mite Extract Other (See Comments)    Sneezing and congestion   Oxycodone  Hcl Other (See Comments) and Nausea And Vomiting    NDC Rniz:[id number]   Oxycodone -Acetaminophen  Nausea And Vomiting   Tree Extract Other (See Comments)    Tree pollen and scrub pollen    Vicodin [Hydrocodone-Acetaminophen ] Nausea And Vomiting   Victoza [Liraglutide] Other (See Comments)    pancreatitis   Adhesive [Tape] Rash    Not on stomach    Past Medical History:  Diagnosis Date   ADD (attention deficit disorder)    Allergy    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HCC)    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   lasted about 6 hours, Echo did not show anything   Bilateral swelling of feet    Cholelithiasis    Chronic depressive disorder    Chronic diastolic CHF (congestive heart failure) (HCC)    Circadian rhythm sleep disorder, shift work    DDD (degenerative disc disease), lumbosacral  05/2012   Diabetes (HCC)    Diverticulosis    Minimal   Fatty liver    GERD (gastroesophageal reflux disease)    Heart murmur    Hepatic steatosis    History of hiatal hernia 2017   Small   HLA B27 (HLA B27 positive)    Hx of pancreatitis    Hyperlipidemia    Hypertension    IBS (irritable bowel syndrome)    Ischemic colitis (HCC)    Lactose intolerance    Lumbar scoliosis 2010   Mild   Migraine headache without aura 05/10/2015   Multiple thyroid  nodules    Nephrolithiasis 2014   Left, minimal   Obesity due to excess calories with serious comorbidity 05/24/2019   Palpitations    PONV (postoperative nausea and vomiting)    Prediabetes    Presbyopia    Right bundle branch block    Sepsis (HCC) 01/2016   Sleep apnea    Sleep hypopnea    Vitamin D  deficiency     Past Surgical History:  Procedure Laterality Date   BREAST BIOPSY Left    CERVICAL CONE BIOPSY     CHOLECYSTECTOMY N/A 10/26/2014   Procedure: LAPAROSCOPIC CHOLECYSTECTOMY;  Surgeon: Vicenta Poli, MD;  Location: [facility];  Service: General;  Laterality: N/A;   COLONOSCOPY W/ BIOPSIES  multiple   CYSTOSCOPY WITH RETROGRADE PYELOGRAM, URETEROSCOPY AND STENT PLACEMENT Left 06/26/2018   Procedure: CYSTOSCOPY WITH RETROGRADE PYELOGRAM, URETEROSCOPY AND STENT PLACEMENT;  Surgeon: Alvaro Hummer, MD;  Location: [facility];  Service: Urology;  Laterality: Left;   DIAGNOSTIC LAPAROSCOPY     DILATION AND CURETTAGE OF UTERUS  11/2009   DUPUYTREN CONTRACTURE RELEASE Left    x 3 , Dr Camella   ESOPHAGOGASTRODUODENOSCOPY  multiple   EYE SURGERY     HOLMIUM LASER APPLICATION Left 06/26/2018   Procedure: HOLMIUM LASER APPLICATION;  Surgeon: Alvaro Hummer, MD;  Location: [facility];  Service: Urology;  Laterality: Left;   KNEE ARTHROSCOPY Left 11/2008   LAPAROSCOPIC VAGINAL HYSTERECTOMY WITH SALPINGO OOPHORECTOMY Bilateral 10/03/2015   Procedure: LAPAROSCOPIC ASSISTED  VAGINAL HYSTERECTOMY WITH SALPINGECTOMY;  Surgeon: Shanda SHAUNNA Muscat, MD;  Location: [facility];  Service: Gynecology;  Laterality: Bilateral;   LASIK  1990   RK   LEFT FINGER SURGERY     LIVER BIOPSY     MASS EXCISION Left 12/24/2012   Procedure: LEFT EXCISION OF PALMAR MASS X TWO;  Surgeon: Elsie Camella, MD;  Location: [facility];  Service: Orthopedics;  Laterality:  Left;   PHOTOREFRACTIVE KERATOTOMY  1994   RADIAL KERATOTOMY  1994   TRANSOBTURATOR SLING       Physical Exam HENT:     Head: Normocephalic.  Eyes:     Extraocular Movements: Extraocular movements intact.  Cardiovascular:     Rate and Rhythm: Normal rate.  Pulmonary:     Effort: Pulmonary effort is normal.  Abdominal:     Palpations: Abdomen is soft.  Musculoskeletal:        General: Normal range of motion.     Cervical back: Normal range of motion.  Neurological:     Mental Status: She is alert.        Results for orders placed or performed during the hospital encounter of 12/26/23  MR BRAIN W WO CONTRAST   Narrative   EXAM: MRI BRAIN WITH AND WITHOUT CONTRAST 12/26/2023 06:38:00 PM  TECHNIQUE: Multiplanar multisequence MRI of the head/brain was performed with and without the administration of intravenous contrast.  COMPARISON: MRI brain 12/26/23  CLINICAL HISTORY: Headache, new onset (Age >= 51y); stealth protocol. Right posterior temporal lobe mass, possibly extra-axial. There is associated edema in the right temporal and parietal lobes.  FINDINGS:  BRAIN AND VENTRICLES: No acute infarct. No acute intracranial hemorrhage. The previously described mass in the posterior right temporal lobe demonstrates avid peripheral enhancement with central necrosis. The mass measures up to 4.5 x 3.9 cm on axial image 64, series 6. There is some associated dural enhancement extending anteriorly along the right temporal convexity. Unchanged surrounding vasogenic edema and 10 mm of leftward  midline shift. Unchanged right uncal herniation and downward transtentorial herniation.  ORBITS: No acute abnormality.  SINUSES: No acute abnormality.  BONES AND SOFT TISSUES: Normal bone marrow signal and enhancement. No acute soft tissue abnormality.  IMPRESSION: 1. Posterior right temporal lobe mass with avid peripheral enhancement and central necrosis, measuring up to 4.5 x 3.9 cm, with associated dural enhancement extending anteriorly along the right temporal convexity. 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 2. Unchanged surrounding vasogenic edema, 10 mm of leftward midline shift, right uncal herniation, and downward transtentorial herniation.  Electronically signed by: Ryan Chess MD 12/26/2023 07:01 PM EDT RP Workstation: [id number]   MR BRAIN WO CONTRAST   Addendum: 12/26/2023    ADDENDUM: Findings discussed with Tinnie Matter, PA at 4:06 PM on 12/26/23.  ----------------------------------------------------  Electronically signed by: Donnice Mania MD 12/26/2023 04:17 PM EDT RP Workstation: [id number]     Narrative    ORIGINAL REPORT  EXAM: MRI BRAIN WITHOUT CONTRAST 12/26/2023 03:37:44 PM  TECHNIQUE: Multiplanar multisequence MRI of the head/brain was performed without the administration of intravenous contrast.  COMPARISON: MRA head dated 12/02/2023.  CLINICAL HISTORY: Headache, new onset (Age >= 51y). Headache; Dizziness. Confusion.  FINDINGS:  BRAIN AND VENTRICLES: There is a heterogeneous mass along the lateral aspect of the right temporal occipital lobes which measures approximately 4.0 x 3.3 x 3.5 cm with surrounding vasogenic edema within the right temporal lobe extending into the lateral aspect of the right occipital lobe and the inferior right parietal lobe. There is associated mass effect and sulcal effacement with approximately 1.1 cm leftward midline shift. There is a possible CSF cleft around the mass most  apparent on series 13 image 12 which may suggest extraaxial lesion. The mass is relatively heterogeneous with multiple internal flow voids visualized. Additional focus of edema involving the anterolateral cortex of the right temporal lobe without restricted diffusion to suggest infarct. There is no evidence  of acute infarct or acute hemorrhage. Nonspecific scattered foci of T2/FLAIR intensity in the periventricular and subcortical white matter. Edema also noted extending into the posterolateral right internal capsule as well as into the right thalamus. There is crowding of the ambient cisterns more so on the right and crowding of the right aspect of the quadrigeminal cistern. There is also focal edema noted involving the right optic tract seen on series 10 image 21. Partial effacement of the right lateral ventricle.  ORBITS: Prominence of CSF spaces within the left optic nerve sheath along the posterior aspect of the left globe.  SINUSES AND MASTOIDS: No acute abnormality.  BONES AND SOFT TISSUES: Normal marrow signal. No acute soft tissue abnormality.  IMPRESSION: 1. Heterogeneous mass along the lateral aspect of the right temporal occipital lobes measuring approximately 4.0 x 3.3 x 3.5 cm, with surrounding vasogenic edema, mass effect, and 1.1 cm leftward midline shift. 2. CSF cleft suggesting possible extra-axial mass lesion. 3. MRI with contrast is recommended for further evaluation. 4. Edema extends into the right optic tract. Prominence of CSF spaces in the left optic nerve sheath posterior to the left globe. Recommend correlation with fundoscopic exam. 5. No evidence of acute infarct or hemorrhage.  Electronically signed by: Donnice Mania MD 12/26/2023 03:53 PM EDT RP Workstation: [id number]   Results for orders placed or performed in visit on 12/02/23  MR ANGIO HEAD WO CONTRAST   Narrative     [facility] [address] [place], [place]  [address] [phone]  NEUROIMAGING REPORT   STUDY DATE: 12/02/2023 PATIENT NAME: Alexandra Walker DOB: 1956/05/06 MRN: [id number]  ORDERING CLINICIAN: Dr. Chalice CLINICAL HISTORY: 68 year old patient being evaluated for icepick  headaches COMPARISON FILMS: None EXAM: MR angiogram of the brain without contrast TECHNIQUE: 2D and 3D time-of-flight images were obtained and constructed  to great angiographic effect of the vessels of the intracranial  circulation of the circle of Willis CONTRAST: None IMAGING SITE: [facility] at [address]  FINDINGS:  Both internal carotid arteries demonstrate normal flow and caliber in the  petrous, cavernous and terminal supraclinoid segments.  Both middle and  anterior cerebral arteries appear normal.  Both vertebral arteries  contribute to the basilar arteries of the left vertebral artery appears to  be dominant and the right vertebral artery appears to be hypoplastic in  the coronal segment..  Basilar artery shows normal flow and caliber.  The  P1 segment of the left posterior cerebral artery is hypoplastic and this  vessel fills predominantly from the anterior circulation suggesting  persistent fetal pattern of origin.  There are no definite visualized  aneurysms or aneurysm smaller than 2 mm in size are not adequately  visualized on MRA due to technical limitations.       Impression   MR angiogram study of the brain shows no significant narrowing  of the large and medium size intracranial vessels.  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    INTERPRETING PHYSICIAN:  EATHER POPP, MD Certified in  Neuroimaging by American Society of Neuroimaging and [employer]

## 2023-12-29 NOTE — Progress Notes (Addendum)
 eLink Physician-Brief Progress Note Patient Name: Alexandra Walker DOB: 1956-02-25 MRN: [id number]   Date of Service  12/29/2023  HPI/Events of Note  68 year old female who saw a neurologist after coworkers realize she is not being herself.  At that time she admitted to having headaches and some dizziness.  MRI of the brain reviewed a right temporal lobe tumor.  She is postop right craniotomy for resection of brain tumor.  Extubated postoperatively and in the ICU on no hemodynamic support.  eICU Interventions  Patient's chart reviewed.  Pertinent lab and imaging studies reviewed.  Video assessment of patient done. 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  Postop care per neurosurgical recommendations.  Blood pressure control with oral and IV medications as needed. On morphine  for headaches. Discussed with RN.     Intervention Category Evaluation Type: New Patient Evaluation  Jerilynn Berg 12/29/2023, 9:10 PM  Follow-up: RN reports morphine  appears to be ineffective in relieving patient's headache. She remains NPO.  IV acetaminophen  x 1 ordered to see if it relieves headache. RN informed.  Jerilynn Berg 12/30/2023, 4:30 AM
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
## 2023-12-29 NOTE — TOC CM/SW Note (Signed)
 Transition of Care [facility]) - Inpatient Brief Assessment   Patient Details  Name: Alexandra Walker MRN: [id number] Date of Birth: [birth year]  [facility] CM/SW Contact:    Andrez JULIANNA George, RN Phone Number: 12/29/2023, 3:39 PM   Clinical Narrative:  Pt is going for surgery today.  If IP Care needs arise please place consult.   Transition of Care Asessment: Insurance and Status: Insurance coverage has been reviewed Patient has primary care physician: Yes Home environment has been reviewed: home alone   Prior/Current Home Services: No current home services Social Drivers of Health Review: SDOH reviewed no interventions necessary Readmission risk has been reviewed: Yes Transition of care needs: no transition of care needs at this time

## 2023-12-29 NOTE — Progress Notes (Signed)
 Cpap on hold for now due to pt. Having a drain.

## 2023-12-29 NOTE — Anesthesia Procedure Notes (Signed)
 Procedure Name: Intubation Date/Time: 12/29/2023 5:15 PM  Performed by: Emmitt Millman, CRNAPre-anesthesia Checklist: Patient identified, Emergency Drugs available, Suction available and Patient being monitored Patient Re-evaluated:Patient Re-evaluated prior to induction Oxygen  Delivery Method: Circle system utilized Preoxygenation: Pre-oxygenation with 100% oxygen  Induction Type: IV induction Ventilation: Mask ventilation without difficulty Laryngoscope Size: Mac and 3 Grade View: Grade I Tube type: Oral Tube size: 7.0 mm Number of attempts: 1 Airway Equipment and Method: Stylet Placement Confirmation: ETT inserted through vocal cords under direct vision, positive ETCO2 and breath sounds checked- equal and bilateral Secured at: 22 cm Tube secured with: Tape Dental Injury: Teeth and Oropharynx as per pre-operative assessment

## 2023-12-29 NOTE — Op Note (Signed)
 DATE OF SURGERY: 12/29/2023   ATTENDING SURGEON: Dino Sable, MD   ASSISTANT: None   PREOPERATIVE DIAGNOSIS: Brain tumor   POSTOPERATIVE DIAGNOSIS: Same    PROCEDURE PERFORMED:  1. RIGHT craniotomy for resection of brain tumor 2. Use of neuro navigation for assessment of extent of resection and localization 3. Use of microscope for microscopic dissection  ANESTHESIA: General endotracheal anesthesia.     ESTIMATED BLOOD LOSS, URINE OUTPUT, AND CRYSTALLOIDS:   See anesthesia chart.     COMPLICATIONS: None.     SPECIMENS: Tumor   DRAINS: JP    PREOPERATIVE COURSE:   Patient is an 68 year old lady who presented to our service and was found to have a right temporal tumor.  Options were discussed with the patient and family of doing nothing versus radiation versus surgery followed by radiation and they opted for the latter.  Risks discussed included but not limited to infection, hemorrhage stroke paralysis blindness speech impairment, seizures DVT PE cardiopulmonary complications and death amongst others.  All the questions were answered to the satisfaction as was per them and they requested for us  to proceed.   DESCRIPTION OF PROCEDURE: Patient brought to the operating room after general endotracheal anesthesia was ensued patient placed in the supine position with the head on a horseshoe head holder.  Registration was obtained after which the tumor was outlined on the scalp and a curvilinear incision was outlined.  Hereafter the patient was prepped and draped in usual sterile fashion and the outline of the incision was injected with lidocaine  with epinephrine .  Then, using a #10 blade an incision was made and retractors were placed.  With navigation the tumor was outlined on the skull and 2 bur holes were made, craniotomy performed and the bone was saved in a soaked sponge.  Copious irrigation was performed hemostasis obtained after which the dura was opened in a cruciate fashion.   The microscope was then brought in, correct entry point identified with navigation and small corticectomy was performed and the tumor was then identified.  This was then biopsied and sent for frozen section and internal debulking was performed.  After circumferential dissection was performed and the shell of the tumor could be dissected away from the brain and in this fashion using a bipolar forceps and suction the tumor was freed from the surrounding brain and in a piecemeal fashion removed.  Here after hemostasis was obtained copious irrigation was performed after which the cavity was lined with Surgicel.  With navigation complete resection was confirmed.  Here after copious irrigation was performed and duraplasty performed after which the microscope was taken out and the bone was replaced with titanium plates and screws and cranioplasty performed.  After hemostasis and irrigation a subgaleal drain was left in situ and the galea and skin were closed in separate layers.   At the end of the procedure all counts were complete.     It should be noted that Stereotactic computer-assisted navigation was indicated due to patient's anatomy. Films were reviewed for preop planning. Registration was personally performed by the me. Visual confirmation was done. Navigation was utilized during different portions of the surgery, including on entry to the brain and localization of the tumor.

## 2023-12-29 NOTE — Anesthesia Postprocedure Evaluation (Signed)
 Anesthesia Post Note  Patient: Alexandra Walker  Procedure(s) Performed: CRANIOTOMY TUMOR EXCISION (Right: Head) COMPUTER-ASSISTED NAVIGATION, FOR CRANIAL PROCEDURE     Patient location during evaluation: PACU Anesthesia Type: General Level of consciousness: awake and alert Pain management: pain level controlled Vital Signs Assessment: post-procedure vital signs reviewed and stable Respiratory status: spontaneous breathing, nonlabored ventilation, respiratory function stable and patient connected to nasal cannula oxygen  Cardiovascular status: blood pressure returned to baseline and stable Postop Assessment: no apparent nausea or vomiting Anesthetic complications: no   No notable events documented.  Last Vitals:  Vitals:   12/29/23 1945 12/29/23 2000  BP: 132/68 119/63  Pulse: 60 63  Resp: 18 19  Temp:  (!) 36.4 C  SpO2: 94% 93%    Last Pain:  Vitals:   12/29/23 2030  TempSrc:   PainSc: 4                  Nakaila Freeze P Willye Javier

## 2023-12-29 NOTE — Progress Notes (Signed)
 Pt's co-worker, [place]  Claudene, paged chaplain to request presence at prayer circle at pt's bedside at 1230 today. Chaplain arrived at 1140 to introduce spiritual care to pt and obtain pt consent for prayer organized by her colleagues at [facility].   Chaplain asked open ended questions to facilitate emotional expression and story telling. Alexandra Walker shared that she was diagnosed with a brain tumor after her boss insisted she go to the ED for evaluation. She acknowledges some feelings of shame and embarrassment because her co-workers were concerned about differences in her behavior, but she also expresses gratitude for the realization of her tumor. She reports great confidence in her surgeon, but is appropriately worried about the procedure and life afterward. Chaplain utilized reflective listening to identify sources of hope as well as challenges. [name] is grateful for her work colleuages and aware of how this is impacting them. She shares that they have different political views, but have been there for one another through many of life's milestones. She is hopeful that her medical challenges might encourage others' faith.  Chaplain supported Alexandra Walker and 15-20 of her co-workers who circled around her in person and online for a time of prayer as she prepares for surgery later today. CHaplain will continue to follow.  Please page as further needs arise.  Alan HERO. Davee Lomax, M.Div. [facility] Chaplain Pager [phone] Office [phone]

## 2023-12-30 ENCOUNTER — Encounter (HOSPITAL_COMMUNITY): Payer: Self-pay | Admitting: Neurosurgery

## 2023-12-30 ENCOUNTER — Ambulatory Visit: Admitting: Neurology

## 2023-12-30 DIAGNOSIS — R900 Intracranial space-occupying lesion found on diagnostic imaging of central nervous system: Secondary | ICD-10-CM | POA: Diagnosis not present

## 2023-12-30 DIAGNOSIS — I10 Essential (primary) hypertension: Secondary | ICD-10-CM

## 2023-12-30 DIAGNOSIS — G4733 Obstructive sleep apnea (adult) (pediatric): Secondary | ICD-10-CM

## 2023-12-30 LAB — CBC
HCT: 40.5 % (ref 36.0–46.0)
Hemoglobin: 13.2 g/dL (ref 12.0–15.0)
MCH: 27.6 pg (ref 26.0–34.0)
MCHC: 32.6 g/dL (ref 30.0–36.0)
MCV: 84.6 fL (ref 80.0–100.0)
Platelets: 196 K/uL (ref 150–400)
RBC: 4.79 MIL/uL (ref 3.87–5.11)
RDW: 12.6 % (ref 11.5–15.5)
WBC: 8.9 K/uL (ref 4.0–10.5)
nRBC: 0 % (ref 0.0–0.2)

## 2023-12-30 LAB — BASIC METABOLIC PANEL WITH GFR
Anion gap: 9 (ref 5–15)
BUN: 15 mg/dL (ref 8–23)
CO2: 19 mmol/L — ABNORMAL LOW (ref 22–32)
Calcium: 8.3 mg/dL — ABNORMAL LOW (ref 8.9–10.3)
Chloride: 107 mmol/L (ref 98–111)
Creatinine, Ser: 0.56 mg/dL (ref 0.44–1.00)
GFR, Estimated: >60 mL/min (ref >60)
Glucose, Bld: 203 mg/dL — ABNORMAL HIGH (ref 70–99)
Potassium: 4.4 mmol/L (ref 3.5–5.1)
Sodium: 135 mmol/L (ref 135–145)

## 2023-12-30 LAB — GLUCOSE, CAPILLARY
Glucose-Capillary: 190 mg/dL — ABNORMAL HIGH (ref 70–99)
Glucose-Capillary: 252 mg/dL — ABNORMAL HIGH (ref 70–99)
Glucose-Capillary: 149 mg/dL — ABNORMAL HIGH (ref 70–99)
Glucose-Capillary: 193 mg/dL — ABNORMAL HIGH (ref 70–99)
Glucose-Capillary: 321 mg/dL — ABNORMAL HIGH (ref 70–99)

## 2023-12-30 LAB — POCT I-STAT 7, (LYTES, BLD GAS, ICA,H+H)
Acid-base deficit: 1 mmol/L (ref 0.0–2.0)
Bicarbonate: 23.5 mmol/L (ref 20.0–28.0)
Calcium, Ion: 1.2 mmol/L (ref 1.15–1.40)
HCT: 38 % (ref 36.0–46.0)
Hemoglobin: 12.9 g/dL (ref 12.0–15.0)
O2 Saturation: 98 %
Patient temperature: 36.1
Potassium: 3.5 mmol/L (ref 3.5–5.1)
Sodium: 140 mmol/L (ref 135–145)
TCO2: 25 mmol/L (ref 22–32)
pCO2 arterial: 37.8 mmHg (ref 32–48)
pH, Arterial: 7.398 (ref 7.35–7.45)
pO2, Arterial: 106 mmHg (ref 83–108)

## 2023-12-30 MED ORDER — CEFAZOLIN SODIUM-DEXTROSE 2-4 GM/100ML-% IV SOLN
2.0000 g | Freq: Three times a day (TID) | INTRAVENOUS | Status: DC
  Administered 2023-12-31 (×2): 2 g via INTRAVENOUS

## 2023-12-30 MED ORDER — INSULIN ASPART 100 UNIT/ML IJ SOLN
0.0000 [IU] | Freq: Three times a day (TID) | INTRAMUSCULAR | Status: DC
  Administered 2023-12-30 (×2): 2 [IU] via SUBCUTANEOUS
  Administered 2023-12-30 (×2): 8 [IU] via SUBCUTANEOUS
  Administered 2023-12-31 (×2): 3 [IU] via SUBCUTANEOUS
  Administered 2023-12-31 (×2): 8 [IU] via SUBCUTANEOUS

## 2023-12-30 MED ORDER — KETOROLAC TROMETHAMINE 15 MG/ML IJ SOLN
15.0000 mg | Freq: Once | INTRAMUSCULAR | Status: AC
  Administered 2023-12-30 (×2): 15 mg via INTRAVENOUS
  Filled 2023-12-30: qty 1

## 2023-12-30 MED ORDER — PANTOPRAZOLE SODIUM 40 MG PO TBEC
40.0000 mg | DELAYED_RELEASE_TABLET | Freq: Every day | ORAL | Status: DC
  Administered 2023-12-30 (×2): 40 mg via ORAL
  Filled 2023-12-30: qty 1

## 2023-12-30 MED ORDER — LEVETIRACETAM 500 MG PO TABS
500.0000 mg | ORAL_TABLET | Freq: Two times a day (BID) | ORAL | Status: DC
  Administered 2023-12-30 – 2023-12-31 (×4): 500 mg via ORAL
  Filled 2023-12-30 (×2): qty 1

## 2023-12-30 MED ORDER — ACETAMINOPHEN 10 MG/ML IV SOLN
1000.0000 mg | Freq: Once | INTRAVENOUS | Status: AC
  Administered 2023-12-30 (×2): 1000 mg via INTRAVENOUS
  Filled 2023-12-30: qty 100

## 2023-12-30 MED ORDER — INSULIN ASPART 100 UNIT/ML IJ SOLN: 0.0000 [IU] | Freq: Every day | INTRAMUSCULAR | Status: DC

## 2023-12-30 NOTE — Evaluation (Signed)
 Physical Therapy Evaluation Patient Details Name: Alexandra Walker MRN: [id number] DOB: Dec 17, 1955 Today's Date: 12/30/2023  History of Present Illness  68 y.o. 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 Pt underwent crani with tumor resection on 8/11. PMH includes HTN, DMII, anti-phospholipid syndrome, ADHD.  Clinical Impression  Pt presents to PT with deficits in power, endurance, gait, balance. Pt reports dizziness with changes in direction, which subsides quickly when mobilizing in a straight path. Pt is able to increase gait speed at PT request without significant change in gait quality. PT anticipates the pt will continue to progress well with further opportunity to mobility. PT recommends outpatient PT at the time of discharge.      If plan is discharge home, recommend the following: A little help with bathing/dressing/bathroom;Assistance with cooking/housework;Assist for transportation;Help with stairs or ramp for entrance   Can travel by private vehicle        Equipment Recommendations None recommended by PT  Recommendations for Other Services       Functional Status Assessment Patient has had a recent decline in their functional status and demonstrates the ability to make significant improvements in function in a reasonable and predictable amount of time.     Precautions / Restrictions Precautions Precautions: Fall Recall of Precautions/Restrictions: Intact Restrictions Weight Bearing Restrictions Per Provider Order: No      Mobility  Bed Mobility Overal bed mobility: Modified Independent                  Transfers Overall transfer level: Needs assistance Equipment used: None Transfers: Sit to/from Stand Sit to Stand: Supervision                Ambulation/Gait Ambulation/Gait assistance: Supervision Gait Distance (Feet): 400 Feet Assistive device:  None Gait Pattern/deviations: Step-through pattern Gait velocity: reduced Gait velocity interpretation: 1.31 - 2.62 ft/sec, indicative of limited community ambulator   General Gait Details: pt with slowed step-through gait, widened BOS, reaching for UE support of railing initially but ambulates without UE support for final 200'. Pt reports mild dizziness with changes in direction which subsides quickly after completion of turns  Acupuncturist Bed    Modified Rankin (Stroke Patients Only)       Balance Overall balance assessment: Needs assistance Sitting-balance support: No upper extremity supported, Feet supported Sitting balance-Leahy Scale: Good     Standing balance support: No upper extremity supported, During functional activity Standing balance-Leahy Scale: Fair                               Pertinent Vitals/Pain Pain Assessment Pain Assessment: Faces Faces Pain Scale: Hurts little more Pain Location: incision site and L wrist where A-line had been present Pain Descriptors / Indicators: Aching Pain Intervention(s): Monitored during session    Home Living Family/patient expects to be discharged to:: Private residence Living Arrangements: Alone Available Help at Discharge: Family;Available 24 hours/day Type of Home: House Home Access: Stairs to enter Entrance Stairs-Rails: Doctor, general practice of Steps: 3   Home Layout: One level Home Equipment: None      Prior Function Prior Level of Function : Independent/Modified Independent;Working/employed;Driving             Mobility Comments: 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 [employer]       Extremity/Trunk Assessment   Upper Extremity Assessment Upper Extremity Assessment: Overall [facility] for tasks assessed    Lower Extremity Assessment Lower Extremity Assessment: Overall WFL for tasks assessed    Cervical / Trunk Assessment Cervical / Trunk  Assessment: Normal  Communication   Communication Communication: Other (comment) (verbose, tangential at times)    Cognition Arousal: Alert Behavior During Therapy: WFL for tasks assessed/performed   PT - Cognitive impairments: Attention                       PT - Cognition Comments: pt is verbose throughout session Following commands: Intact       Cueing Cueing Techniques: Verbal cues     General Comments General comments (skin integrity, edema, etc.): VSS on RA    Exercises     Assessment/Plan    PT Assessment Patient needs continued PT services  PT Problem List Decreased activity tolerance;Decreased mobility;Decreased balance;Decreased cognition       PT Treatment Interventions DME instruction;Gait training;Stair training;Functional mobility training;Therapeutic activities;Therapeutic exercise;Balance training;Neuromuscular re-education;Patient/family education    PT Goals (Current goals can be found in the Care Plan section)  Acute Rehab PT Goals Patient Stated Goal: to return to independence PT Goal Formulation: With patient Time For Goal Achievement: 01/13/24 Potential to Achieve Goals: Good Additional Goals Additional Goal #1: Pt will score >19/24 on the DGI to indicate a reduced risk for falls    Frequency Min 2X/week     Co-evaluation               AM-PAC PT 6 Clicks Mobility  Outcome Measure Help needed turning from your back to your side while in a flat bed without using bedrails?: None Help needed moving from lying on your back to sitting on the side of a flat bed without using bedrails?: None Help needed moving to and from a bed to a chair (including a wheelchair)?: A Little Help needed standing up from a chair using your arms (e.g., wheelchair or bedside chair)?: A Little Help needed to walk in hospital room?: A Little Help needed climbing 3-5 steps with a railing? : A Little 6 Click Score: 20    End of Session Equipment  Utilized During Treatment: Gait belt Activity Tolerance: Patient tolerated treatment well Patient left: in chair;with call bell/phone within reach;with family/visitor present Nurse Communication: Mobility status PT Visit Diagnosis: Other abnormalities of gait and mobility (R26.89)    Time: 9167-9092 PT Time Calculation (min) (ACUTE ONLY): 35 min   Charges:   PT Evaluation $PT Eval Low Complexity: 1 Low   PT General Charges $$ ACUTE PT VISIT: 1 Visit         Bernardino JINNY Ruth, PT, DPT Acute Rehabilitation Office [phone]   Bernardino JINNY Ruth 12/30/2023, 9:24 AM

## 2023-12-30 NOTE — Progress Notes (Addendum)
 A-line removed. Pt tolerating p.o. meds and ginger ale. JP drain removed by Dr Rosslyn.  Advanced diet to heart healthy. Cleviprex  gtt turned off. BP within parameters.

## 2023-12-30 NOTE — Progress Notes (Signed)
 Follow up visit. Alexandra Walker shared that she had a relatively good night and expressed gratitude for her night nurses who advocated for her request for Toradol  for significant bone pain due to her art line. She also expressed gratitude toward the APP who ordered those medications this morning and reports he pain was at a 12 and is now around a 1.5.   Alexandra Walker is still coming to grips with the significance of this medical diagnosis and what it means for her future, but states she is trying to find hope and be optimistic. She leans heavily on her faith and hopes that her experience with this tumor might be a light for others. She is aware of the ways it has already led to deeper connection with friends/colleagues whom she thought she had significant ideological differences from.  Please page as further needs arise.  Alan HERO. Davee Lomax, M.Div. [facility] Chaplain Pager [phone] Office [phone]

## 2023-12-30 NOTE — Progress Notes (Signed)
 Patient transferred to 3W-37 with no complications. Report given via phone to receiving RN, Tausi prior to transport. Per patients request, she ambulated from 4N-19 to 3W-37 with a wheelchair in front of her for balance support with this RN providing standby assist. Patient ambulated well with steady gate, on room air, VSS throughout transport. All belongings transferred with patient.

## 2023-12-30 NOTE — TOC Initial Note (Signed)
 Transition of Care [facility]) - Initial/Assessment Note    Patient Details  Name: Alexandra Walker MRN: [id number] Date of Birth: 17-Oct-1955  [facility] of [facility]) CM/SW Contact:    Marval Gell, RN Phone Number: 12/30/2023, 11:53 AM  Clinical Narrative:                  Beatris w patient and daughter at bedside. Answered questions for daughter re her FMLA. She will take papers to [facility]. Patient has recs and requested OP therapies at [facility], referral has been placed electronically.  Expected Discharge Plan: Home/Self Care Barriers to Discharge: Continued Medical Work up   Patient Goals and CMS Choice Patient states their goals for this hospitalization and ongoing recovery are:: to go home CMS Medicare.gov Compare Post Acute Care list provided to:: Patient Choice offered to / list presented to : Patient      Expected Discharge Plan and Services   Discharge Planning Services: CM Consult   Living arrangements for the past 2 months: Single Family Home                                      Prior Living Arrangements/Services Living arrangements for the past 2 months: Single Family Home Lives with:: Self                   Activities of Daily Living      Permission Sought/Granted                  Emotional Assessment              Admission diagnosis:  Intracranial mass [R90.0] Lightheadedness [R42] Right temporal lobe mass [G93.89] Acute nonintractable headache, unspecified headache type [R51.9] Brain tumor [facility]) [D49.6] Patient Active Problem List   Diagnosis Date Noted   Brain tumor (HCC) 12/29/2023   Intracranial mass 12/26/2023   Thyroid  nodule 10/07/2023   Osteopenia after menopause 06/13/2023   Shifting sleep-work schedule, affecting sleep 10/07/2022   Sleep related bruxism 10/07/2022   Esophageal stricture 09/02/2022   Bilateral lower extremity edema 09/02/2022   Controlled type 2 diabetes mellitus without complication,  without long-term current use of insulin  (HCC) 05/27/2022   Morbid obesity (HCC) 08/07/2020   Bilateral hip bursitis 05/06/2019   Diverticulosis of colon 05/16/2018   Drug-induced pancreatitis, 2015, thought to be due to [facility] 03/29/2018   Nephrolithiasis 03/29/2018   History of vitreous detachment OU, HTN retinopathy OU, High myopia OU, Cataracts OU, Vitreous floaters OU, Keratoconjunctivitis sicca OU not specified as Sjogren's 03/29/2018   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HCC)    Attention deficit hyperactivity disorder (ADHD), combined type 10/19/2017   Recurrent circadian rhythm sleep disorder, shift work type 03/31/2017   IBS (irritable bowel syndrome), diarrhea predominant 03/17/2017   NAFLD (nonalcoholic fatty liver disease) 94/89/7981   OSA on CPAP, followed by Neurology/Sleep 09/26/2016   History of sepsis 02/01/2016   Hypertension associated with diabetes (HCC) 01/31/2016   Combined hyperlipidemia associated with type 2 diabetes mellitus (HCC) 05/10/2015   Vitamin D  deficiency, on Vit D 2000 IU daily 05/10/2015   Paroxysmal SVT (supraventricular tachycardia) (HCC) 09/19/2011   PCP:  Jodie Lavern LITTIE, MD Pharmacy:   [facility] [place] [facility] [address], [address] [place] [address] Phone: [phone] Fax: [phone]     Social Drivers of Health (SDOH) Social History: SDOH Screenings   Food  Insecurity: No Food Insecurity (12/27/2023)  Housing: Low Risk  (12/27/2023)  Transportation Needs: No Transportation Needs (12/27/2023)  Utilities: Not At Risk (12/27/2023)  Alcohol Screen: Low Risk  (06/13/2023)  Depression (PHQ2-9): Low Risk  (09/30/2023)  Financial Resource Strain: Low Risk  (06/13/2023)  Physical Activity: Insufficiently Active (06/13/2023)  Social Connections: Moderately Isolated (12/27/2023)  Stress: No Stress Concern Present (06/13/2023)  Tobacco Use: Low Risk  (12/29/2023)   SDOH Interventions:     Readmission Risk  Interventions     No data to display

## 2023-12-30 NOTE — Consult Note (Signed)
 NAME:  Alexandra Walker, MRN:  [id number], DOB:  20-Apr-1956, LOS: 4 ADMISSION DATE:  12/26/2023, CONSULTATION DATE:  8/11 REFERRING MD:  Dr. Cherlyn, CHIEF COMPLAINT:  R brain tumor s/p crani w/ resection   History of Present Illness:  Patient is a 68 yo F w/ pertinent PMH HTN, HLD, T2DM, anti-phospholipid syndrome, ADHD, sleep disorder presents to [facility] on 8/8 w/ HA and dizziness.  Followed by neurologist Dr. Chalice outpt. Having worsening HA and dizziness. Came to mch ed on 8/8. 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 NSG consulted. Started on IV decadron . CT chest/abd/pelvis negative for metastatic disease. On 8/11 taken to OR for crani w/ tumor resection. Post op brought to neuro icu. Pccm consulted.  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   Diabetes (HCC)    Diverticulosis    Minimal   Fatty liver    GERD (gastroesophageal reflux disease)    Heart murmur    Hepatic steatosis    History of hiatal hernia 2017   Small   HLA B27 (HLA B27 positive)    Hx of pancreatitis    Hyperlipidemia    Hypertension    IBS (irritable bowel syndrome)    Ischemic colitis (HCC)    Lactose intolerance    Lumbar scoliosis 2010   Mild   Migraine headache without aura 05/10/2015   Multiple thyroid  nodules    Nephrolithiasis 2014   Left, minimal   Obesity due to excess calories with serious comorbidity 05/24/2019   Palpitations    PONV (postoperative nausea and vomiting)    Prediabetes    Presbyopia    Right bundle branch  block    Sepsis (HCC) 01/2016   Sleep apnea    Sleep hypopnea    Vitamin D  deficiency      Significant Hospital Events: Including procedures, antibiotic start and stop dates in addition to other pertinent events   8/8 large right brain tumor 8/11 s/p crani w/ tumor resection; brought to icu post op; pccm consulted  Interim History / Subjective:  Complaining of dull ache over the right eyebrow No other complaints  Objective    Blood pressure 129/63, pulse 73, temperature 97.9 F (36.6 C), temperature source Axillary, resp. rate 18, height 5' 6 (1.676 m), weight 114.3 kg, last menstrual period 05/20/2008, SpO2 93%.        Intake/Output Summary (Last 24 hours) at 12/30/2023 9276 Last data filed at 12/30/2023 0700 Gross per 24 hour  Intake 2015.09 ml  Output 755 ml  Net 1260.09 ml   Filed Weights   12/26/23 1327  Weight: 114.3 kg    Examination: General: pleasant middle aged adult female lying in ICU in NAD HENT: Normocephalic- right surgical incision- clean/intact,  PERRLA intact, Pink MM, teeth intact Lungs: clear, B/L breath sounds, no distress, on RA  Cardiovascular: s1,s2, RRR, no JVD, No mrg  Abdomen: BS active, soft  Extremities: no deformity, follows commands, non pitting edema generalized  Neuro: alert and oriented  x 4, follows commands, mild post-op surgical pain   Resolved problem list   Assessment and Plan    Posterior R temporal lobe mass - s/p surgical resection 8/11.  P: Continue post-op care per NSGY Continue Steroids- increase SSI from very sensitive to moderate SSI, continue CBG monitoring Continue multimodal pain management- will add one time toradol  15mg  IV- renal function/CBC stable  Continue PRN zofran   Continue SBP goal < 160 Continue empiric ancef   Transfer out to progressive later today on 8/12  PT/OT- mobilize out of bed  Remove art line   Hx OSA. P: Continue to defer CPAP at this time, patient still having mild post-op surgical  pain  Continue pulx ox continuous    Hx HTN, HLD. P: Remove art line Continue cozaar , metoprolol  Continue Rosuvastatin   Continue to hold ASA, defer to NSGY  Continue to hold lasix  at this time    DMT2. P: Increase SSI  to moderate SSI from very sensitive  Continue CBG monitoring for now    Possible UTI. P: Continue rocephin  for now    R lobe thyroid  nodule. P: Will need outpatient follow up  Leukocytosis- stable  WBC 8.9 on 8/12  P: Continue to monitor, patient receiving steroids   Best Practice (right click and Reselect all SmartList Selections daily)   Diet/type: NPO- advance diet  DVT prophylaxis SCD Pressure ulcer(s): pressure ulcer assessment deferred  GI prophylaxis: PPI Lines: Arterial Line Foley:  N/A Code Status:  full code Last date of multidisciplinary goals of care discussion [ patient and family updated on 8/12 at beside ]  Total time 40 mins     Christian Jamall Strohmeier AGACNP-BC   [facility] 12/30/2023, 10:23 AM  Please see [email or web address] for pager details.  From 7A-7P if no response, please call [phone]. After hours, please call ELink [phone].

## 2023-12-30 NOTE — Addendum Note (Signed)
 Addendum  created 12/30/23 1809 by Tilford Franky BIRCH, MD   Attestation recorded in Intraprocedure, Intraprocedure Attestations filed

## 2023-12-31 ENCOUNTER — Other Ambulatory Visit: Payer: Self-pay | Admitting: Radiation Therapy

## 2023-12-31 ENCOUNTER — Other Ambulatory Visit (HOSPITAL_BASED_OUTPATIENT_CLINIC_OR_DEPARTMENT_OTHER): Payer: Self-pay

## 2023-12-31 LAB — GLUCOSE, CAPILLARY
Glucose-Capillary: 168 mg/dL — ABNORMAL HIGH (ref 70–99)
Glucose-Capillary: 267 mg/dL — ABNORMAL HIGH (ref 70–99)

## 2023-12-31 MED ORDER — BACLOFEN 10 MG PO TABS
10.0000 mg | ORAL_TABLET | Freq: Two times a day (BID) | ORAL | 0 refills | Status: DC
  Filled 2023-12-31: qty 30, 15d supply, fill #0

## 2023-12-31 MED ORDER — LEVETIRACETAM 500 MG PO TABS
500.0000 mg | ORAL_TABLET | Freq: Two times a day (BID) | ORAL | 3 refills | Status: DC
  Filled 2023-12-31: qty 180, 90d supply, fill #0

## 2023-12-31 MED ORDER — DEXAMETHASONE 4 MG PO TABS
4.0000 mg | ORAL_TABLET | Freq: Two times a day (BID) | ORAL | 3 refills | Status: DC
  Filled 2023-12-31: qty 20, 10d supply, fill #0
  Filled 2023-12-31: qty 70, 35d supply, fill #0
  Filled 2023-12-31: qty 20, 10d supply, fill #0

## 2023-12-31 MED ORDER — BUTALBITAL-APAP-CAFFEINE 50-325-40 MG PO TABS
2.0000 | ORAL_TABLET | ORAL | 0 refills | Status: DC | PRN
  Filled 2023-12-31: qty 14, 2d supply, fill #0

## 2023-12-31 NOTE — Addendum Note (Signed)
 Addendum  created 12/31/23 0933 by Keneth Lynwood POUR, MD   Attestation recorded in Intraprocedure, Flowsheet accepted, Intraprocedure Attestations filed

## 2023-12-31 NOTE — TOC Transition Note (Signed)
 Transition of Care [facility]) - Discharge Note   Patient Details  Name: Alexandra [name] MRN: [id number] Date of Birth: [birth year]  Transition of Care [facility]) CM/SW Contact:  Andrez JULIANNA George, RN Phone Number: 12/31/2023, 1:46 PM   Clinical Narrative:     Pt is discharging home with outpatient referral sent to [facility]. Information on the AVS. Pt will call to schedule the first appointment. [name] for home ordered through [employer] and will be delivered to the room.  Pts daughter to provide assistance/ supervision at home.  Pt has transportation home.   Final next level of care: OP Rehab Barriers to Discharge: No Barriers Identified   Patient Goals and CMS Choice Patient states their goals for this hospitalization and ongoing recovery are:: to go home CMS Medicare.gov Compare Post Acute Care list provided to:: Patient Choice offered to / list presented to : Patient      Discharge Placement                       Discharge Plan and Services Additional resources added to the After Visit Summary for     Discharge Planning Services: CM Consult                                 Social Drivers of Health (SDOH) Interventions SDOH Screenings   Food Insecurity: No Food Insecurity (12/27/2023)  Housing: Low Risk  (12/27/2023)  Transportation Needs: No Transportation Needs (12/27/2023)  Utilities: Not At Risk (12/27/2023)  Alcohol Screen: Low Risk  (06/13/2023)  Depression (PHQ2-9): Low Risk  (09/30/2023)  Financial Resource Strain: Low Risk  (06/13/2023)  Physical Activity: Insufficiently Active (06/13/2023)  Social Connections: Moderately Isolated (12/27/2023)  Stress: No Stress Concern Present (06/13/2023)  Tobacco Use: Low Risk  (12/29/2023)     Readmission Risk Interventions     No data to display

## 2023-12-31 NOTE — Discharge Instructions (Signed)
 Follow-up with primary care physician in 1 week.   Advised to follow-up with Neurosurgery as scheduled. Advised to take Decadron  as advised.

## 2023-12-31 NOTE — Progress Notes (Signed)
 68yo lady with a RIGHT brain tumor  Doing great on POD 2. Ambulating [employer] CDI  I spoke to her sister and I hope to DC her today. All instructions discussed/.

## 2023-12-31 NOTE — Discharge Summary (Signed)
 Physician Discharge Summary  Alexandra [name] FMW:[id number] DOB: 04/08/1956 DOA: 12/26/2023  PCP: Jodie Lavern LITTIE, MD  Admit date: 12/26/2023  Discharge date: 12/31/2023  Admitted From: Home.  Disposition:  Home.  Recommendations for Outpatient Follow-up:  Follow up with PCP in 1-2 weeks. Please obtain BMP/CBC in one week. Advised to follow-up with neurosurgery as scheduled.. Advised to take Decadron  4mg  PO bid as advised. Advised to keppra  bid as advised.  Home Health:None Equipment/Devices:None  Discharge Condition: Stable CODE STATUS:Full code Diet recommendation: Heart Healthy   Brief [facility] Course: This 68 female with PMH significant for hypertension, hyperlipidemia, DM2, ADHD, sleep disorder presented with headache and dizziness.  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  Patient was taken to the OR on 8/11 for craniotomy with tumor resection.  Postop was brought in neuro ICU and hence critical care was involved. Patient currently is doing well.  No significant pain.  As per neurosurgery continue steroids 4 mg Decadron  twice daily, continue Keppra  500 mg twice daily.  Patient has made significant improvement and doing well. Neurosurgery signed off,  recommended patient can be discharged on Keppra  and Decadron  twice daily.   Patient has scheduled follow-up with neurosurgery.  Patient wants to be discharged home.  Discharge Diagnoses:  Principal Problem:   Intracranial mass Active Problems:   Paroxysmal SVT (supraventricular tachycardia) (HCC)   Hypertension associated with diabetes (HCC)   NAFLD (nonalcoholic fatty liver disease)   OSA on CPAP, followed by Neurology/Sleep   Combined hyperlipidemia associated with type 2 diabetes mellitus (HCC)   Controlled type 2 diabetes mellitus without complication, without long-term current use of insulin  (HCC)   Brain tumor [facility])    Discharge Instructions  Discharge  Instructions     Call MD for:  difficulty breathing, headache or visual disturbances   Complete by: As directed    Call MD for:  persistant dizziness or light-headedness   Complete by: As directed    Call MD for:  temperature >100.4   Complete by: As directed    Diet - low sodium heart healthy   Complete by: As directed    Diet Carb Modified   Complete by: As directed    Discharge instructions   Complete by: As directed    Follow-up with primary care physician in 1 week.   Advised to follow-up with neurosurgery as scheduled. Advised to take Decadron  as advised.   Discharge wound care:   Complete by: As directed    Follow-up outpatient neurosurgery.   Increase activity slowly   Complete by: As directed       Allergies as of 12/31/2023       Reactions   Ace Inhibitors Other (See Comments)   coughing   Dust Mite Extract Other (See Comments)   Sneezing and congestion   Oxycodone  Hcl Other (See Comments), Nausea And Vomiting   NDC Rniz:[id number]   Oxycodone -acetaminophen  Nausea And Vomiting   Tree Extract Other (See Comments)   Tree pollen and scrub pollen    Vicodin [hydrocodone-acetaminophen ] Nausea And Vomiting   Victoza [liraglutide] Other (See Comments)   pancreatitis   Adhesive [tape] Rash   Not on stomach        Medication List     PAUSE taking these medications    metoprolol  succinate 100 MG 24 hr tablet Wait to take this until your doctor or other care provider tells you to start again. Commonly known as: TOPROL -XL Take 1 tablet (  100 mg total) by mouth daily with or immediately following a meal.       STOP taking these medications    cephALEXin  500 MG capsule Commonly known as: KEFLEX    cycloSPORINE  0.05 % ophthalmic emulsion Commonly known as: RESTASIS        TAKE these medications    amphetamine -dextroamphetamine  10 MG tablet Commonly known as: Adderall Take 1 tablet (10 mg total) by mouth daily as needed. What changed:  how much to  take reasons to take this   aspirin  325 MG tablet Take 325 mg by mouth daily.   baclofen  10 MG tablet Commonly known as: LIORESAL  Take 1 tablet (10 mg total) by mouth 2 (two) times daily.   butalbital -acetaminophen -caffeine  50-325-40 MG tablet Commonly known as: FIORICET  Take 2 tablets by mouth every 4 (four) hours as needed for headache (moderate headache).   cholecalciferol  1000 units tablet Commonly known as: VITAMIN D  Take 2,000 Units by mouth daily.   cyanocobalamin  1000 MCG tablet Commonly known as: VITAMIN B12 Take 1,000 mcg by mouth daily.   dexamethasone  4 MG tablet Commonly known as: Decadron  Take 1 tablet (4 mg total) by mouth 2 (two) times daily with a meal.   estradiol  0.1 MG/GM vaginal cream Commonly known as: ESTRACE  Place 1 gram vaginally 2 (two) times a week. What changed:  when to take this reasons to take this   fluticasone  50 MCG/ACT nasal spray Commonly known as: FLONASE  Place 1 spray into both nostrils in the morning and at bedtime. What changed:  when to take this reasons to take this   furosemide  40 MG tablet Commonly known as: LASIX  Take 1 tablet (40 mg total) by mouth daily. What changed:  when to take this reasons to take this   levETIRAcetam  500 MG tablet Commonly known as: KEPPRA  Take 1 tablet (500 mg total) by mouth 2 (two) times daily.   losartan  100 MG tablet Commonly known as: COZAAR  Take 1 tablet (100 mg total) by mouth daily.   Lutein 6 MG Caps Take 6 mg by mouth daily.   mometasone  0.1 % cream Commonly known as: ELOCON  Apply 1 application topically 2 (two) times a week. What changed:  when to take this reasons to take this   Mounjaro  10 MG/0.5ML Pen Generic drug: tirzepatide  Inject 10 mg into the skin once a week.   multivitamin with minerals Tabs tablet Take 1 tablet by mouth daily.   potassium chloride  SA 20 MEQ tablet Commonly known as: KLOR-CON  M Take 1 tablet (20 mEq total) by mouth daily. What  changed:  when to take this reasons to take this   rosuvastatin  20 MG tablet Commonly known as: CRESTOR  Take 1 tablet (20 mg total) by mouth daily.               Durable Medical Equipment  (From admission, onward)           Start     Ordered   12/31/23 1353  For home use only DME [name] rolling  Once       Question Answer Comment  [name]: With 5 Inch Wheels   Patient needs a walker to treat with the following condition Weakness      12/31/23 1352              Discharge Care Instructions  (From admission, onward)           Start     Ordered   12/31/23 0000  Discharge wound care:  Comments: Follow-up outpatient neurosurgery.   12/31/23 1343            Follow-up Information     [facility] Follow up.   Specialty: Rehabilitation Why: Call for faster scheduling, a referral has been placed electronically for you Contact information: [address] [place]  [address] [phone]        Rosslyn Dino HERO, MD Follow up in 1 week(s).   Specialty: Neurosurgery Contact information: [address] [address] [place] [address] [phone]                Allergies  Allergen Reactions   Ace Inhibitors Other (See Comments)    coughing   Dust Mite Extract Other (See Comments)    Sneezing and congestion   Oxycodone  Hcl Other (See Comments) and Nausea And Vomiting    NDC Rniz:[id number]   Oxycodone -Acetaminophen  Nausea And Vomiting   Tree Extract Other (See Comments)    Tree pollen and scrub pollen    Vicodin [Hydrocodone-Acetaminophen ] Nausea And Vomiting   Victoza [Liraglutide] Other (See Comments)    pancreatitis   Adhesive [Tape] Rash    Not on stomach    Consultations: Neurosurgery   Procedures/Studies:   Subjective: Patient was seen and examined at bedside.  Overnight events noted. Patient reports doing much better and  wants to be discharged home  today.  Discharge Exam: Vitals:   12/31/23 0748 12/31/23 1224  BP: (!) 149/71 (!) 156/80  Pulse: (!) 53 (!) 55  Resp: 19 20  Temp: 97.9 F (36.6 C) 98.1 F (36.7 C)  SpO2: 99% 99%   Vitals:   12/30/23 2319 12/31/23 0356 12/31/23 0748 12/31/23 1224  BP: (!) 154/73 (!) 156/73 (!) 149/71 (!) 156/80  Pulse: 60 (!) 56 (!) 53 (!) 55  Resp: 18 17 19 20   Temp: 99.1 F (37.3 C) (!) 97.5 F (36.4 C) 97.9 F (36.6 C) 98.1 F (36.7 C)  TempSrc: Oral Oral Oral Oral  SpO2: 97% 97% 99% 99%  Weight:      Height:        General: Pt is alert, awake, not in acute distress Cardiovascular: RRR, S1/S2 +, no rubs, no gallops Respiratory: CTA bilaterally, no wheezing, no rhonchi Abdominal: Soft, NT, ND, bowel sounds + Extremities: no edema, no cyanosis    The results of significant diagnostics from this hospitalization (including imaging, microbiology, ancillary and laboratory) are listed below for reference.     Microbiology: Recent Results (from the past 240 hours)  MRSA Next Gen by PCR, Nasal     Status: None   Collection Time: 12/29/23  7:42 AM   Specimen: Nasal Mucosa; Nasal Swab  Result Value Ref Range Status   MRSA by PCR Next Gen NOT DETECTED NOT DETECTED Final    Comment: (NOTE) The GeneXpert MRSA Assay (FDA approved for NASAL specimens only), is one component of a comprehensive MRSA colonization surveillance program. It is not intended to diagnose MRSA infection nor to guide or monitor treatment for MRSA infections. Test performance is not FDA approved in patients less than 31 years old. Performed at [facility], [address], [place], [place] [address]      Labs: BNP (last 3 results) No results for input(s): BNP in the last 8760 hours. Basic Metabolic Panel: Recent Labs  Lab 12/24/23 2121 12/26/23 1342 12/27/23 0344 12/28/23 0614 12/29/23 1753 12/30/23 0503  NA 140 140 139 139 140 135  K 3.4* 4.0 4.0 4.2  3.5 4.4  CL 104 106 108 107  --  107  CO2  24 22 22 22   --  19*  GLUCOSE 124* 127* 222* 166*  --  203*  BUN 18 20 10 14   --  15  CREATININE 0.75 0.78 0.66 0.68  --  0.56  CALCIUM  9.9 9.4 9.1 9.2  --  8.3*  MG  --  1.8  --   --   --   --    Liver Function Tests: Recent Labs  Lab 12/24/23 2121 12/26/23 1342 12/27/23 0344  AST 23 20 20   ALT 20 20 24   ALKPHOS 100 77 71  BILITOT 0.5 0.3 0.4  PROT 7.2 6.4* 6.6  ALBUMIN 4.2 3.5 3.4*   No results for input(s): LIPASE, AMYLASE in the last 168 hours. No results for input(s): AMMONIA in the last 168 hours. CBC: Recent Labs  Lab 12/24/23 2121 12/26/23 1342 12/27/23 0344 12/28/23 0614 12/29/23 1753 12/30/23 0503  WBC 8.2 8.6 6.4 16.7*  --  8.9  NEUTROABS 5.9 5.9  --  15.0*  --   --   HGB 14.4 13.7 14.1 14.5 12.9 13.2  HCT 44.0 43.3 43.2 45.1 38.0 40.5  MCV 82.2 85.6 83.1 83.1  --  84.6  PLT 300 276 259 340  --  196   Cardiac Enzymes: No results for input(s): CKTOTAL, CKMB, CKMBINDEX, TROPONINI in the last 168 hours. BNP: Invalid input(s): POCBNP CBG: Recent Labs  Lab 12/30/23 1743 12/30/23 2123 12/30/23 2316 12/31/23 0609 12/31/23 1221  GLUCAP 149* 193* 321* 168* 267*   D-Dimer No results for input(s): DDIMER in the last 72 hours. Hgb A1c No results for input(s): HGBA1C in the last 72 hours. Lipid Profile No results for input(s): CHOL, HDL, LDLCALC, TRIG, CHOLHDL, LDLDIRECT in the last 72 hours. Thyroid  function studies No results for input(s): TSH, T4TOTAL, T3FREE, THYROIDAB in the last 72 hours.  Invalid input(s): FREET3 Anemia work up No results for input(s): VITAMINB12, FOLATE, FERRITIN, TIBC, IRON, RETICCTPCT in the last 72 hours. Urinalysis    Component Value Date/Time   COLORURINE YELLOW 12/24/2023 2152   APPEARANCEUR HAZY (A) 12/24/2023 2152   LABSPEC >=1.030 12/24/2023 2152   PHURINE 5.5 12/24/2023 2152   GLUCOSEU NEGATIVE 12/24/2023 2152   GLUCOSEU NEGATIVE 08/18/2018 1159   HGBUR  NEGATIVE 12/24/2023 2152   BILIRUBINUR SMALL (A) 12/24/2023 2152   BILIRUBINUR Negative 10/27/2023 1422   KETONESUR 15 (A) 12/24/2023 2152   PROTEINUR 30 (A) 12/24/2023 2152   UROBILINOGEN 0.2 10/27/2023 1422   UROBILINOGEN 0.2 08/18/2018 1159   NITRITE POSITIVE (A) 12/24/2023 2152   LEUKOCYTESUR SMALL (A) 12/24/2023 2152   Sepsis Labs Recent Labs  Lab 12/26/23 1342 12/27/23 0344 12/28/23 0614 12/30/23 0503  WBC 8.6 6.4 16.7* 8.9   Microbiology Recent Results (from the past 240 hours)  MRSA Next Gen by PCR, Nasal     Status: None   Collection Time: 12/29/23  7:42 AM   Specimen: Nasal Mucosa; Nasal Swab  Result Value Ref Range Status   MRSA by PCR Next Gen NOT DETECTED NOT DETECTED Final    Comment: (NOTE) The GeneXpert MRSA Assay (FDA approved for NASAL specimens only), is one component of a comprehensive MRSA colonization surveillance program. It is not intended to diagnose MRSA infection nor to guide or monitor treatment for MRSA infections. Test performance is not FDA approved in patients less than 41 years old. Performed at [facility], [address] [place], [place] [address]  Time coordinating discharge: Over 30 minutes  SIGNED:   Darcel Dawley, MD  [facility] 12/31/2023, 2:33 PM Pager   If 7PM-7AM, please contact night-coverage

## 2023-12-31 NOTE — Progress Notes (Signed)
 Physical Therapy Treatment Patient Details Name: Alexandra [name] MRN: [id number] DOB: 01-Mar-1956 Today's Date: 12/31/2023   History of Present Illness 68 y.o. 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 Pt underwent crani with tumor resection on 8/11. PMH includes HTN, DMII, anti-phospholipid syndrome, ADHD.   PT Comments  Pt in bed upon arrival and agreeable to PT session. Pt able to ambulate and ascend/descend 3 steps with CGA. When ambulating, pt prefers to reach for UE support with discussion on AD usage. At this time, pt would prefer to use a RW for safety and comfort. Discussed having close guard with stair negotiation with pt verbalizing agreement. Also discussed performing LE exercises and progressive mobility once home. Continue to recommend OP PT upon d/c home. Acute PT to follow.     If plan is discharge home, recommend the following: A little help with bathing/dressing/bathroom;Assistance with cooking/housework;Assist for transportation;Help with stairs or ramp for entrance   Can travel by private vehicle      Yes  Equipment Recommendations  Rolling walker (2 wheels)       Precautions / Restrictions Precautions Precautions: Fall Recall of Precautions/Restrictions: Intact Restrictions Weight Bearing Restrictions Per Provider Order: No     Mobility  Bed Mobility Overal bed mobility: Modified Independent   Transfers Overall transfer level: Needs assistance Equipment used: None Transfers: Sit to/from Stand Sit to Stand: Supervision    General transfer comment: for safety    Ambulation/Gait Ambulation/Gait assistance: Contact guard assist Gait Distance (Feet): 400 Feet Assistive device: None Gait Pattern/deviations: Step-through pattern, Drifts right/left Gait velocity: decr     General Gait Details: slowed gait pattern with wide BOS. Reaches for UE support if  available. Drifts right/left when attempting to ambulate in a straight line   Stairs Stairs: Yes Stairs assistance: Contact guard assist Stair Management: One rail Right, Step to pattern, Sideways Number of Stairs: 3 General stair comments: sideways step to pattern with use of 1 HR, prefers to lean over rail for additional support    Balance Overall balance assessment: Needs assistance Sitting-balance support: No upper extremity supported, Feet supported Sitting balance-Leahy Scale: Good     Standing balance support: Single extremity supported, During functional activity, Reliant on assistive device for balance Standing balance-Leahy Scale: Poor Standing balance comment: prefers to rely on UE support, external support necessary       Communication Communication Communication: Other (comment) (verbose, tangential at times)  Cognition Arousal: Alert Behavior During Therapy: WFL for tasks assessed/performed    PT - Cognition Comments: pt is verbose throughout session Following commands: Intact      Cueing Cueing Techniques: Verbal cues         Pertinent Vitals/Pain Pain Assessment Pain Assessment: No/denies pain     PT Goals (current goals can now be found in the care plan section) Acute Rehab PT Goals PT Goal Formulation: With patient Time For Goal Achievement: 01/13/24 Potential to Achieve Goals: Good Progress towards PT goals: Progressing toward goals    Frequency    Min 2X/week       AM-PAC PT 6 Clicks Mobility   Outcome Measure  Help needed turning from your back to your side while in a flat bed without using bedrails?: None Help needed moving from lying on your back to sitting on the side of a flat bed without using bedrails?: None Help needed moving to and from a bed  to a chair (including a wheelchair)?: A Little Help needed standing up from a chair using your arms (e.g., wheelchair or bedside chair)?: A Little Help needed to walk in hospital  room?: A Little Help needed climbing 3-5 steps with a railing? : A Little 6 Click Score: 20    End of Session   Activity Tolerance: Patient tolerated treatment well Patient left: in bed;with call bell/phone within reach Nurse Communication: Mobility status PT Visit Diagnosis: Other abnormalities of gait and mobility (R26.89)     Time: 0930-1001 PT Time Calculation (min) (ACUTE ONLY): 31 min  Charges:    $Gait Training: 8-22 mins $Therapeutic Activity: 8-22 mins PT General Charges $$ ACUTE PT VISIT: 1 Visit                     Kate ORN, PT, DPT Secure Chat Preferred  Rehab Office [phone]   Kate BRAVO [name] 12/31/2023, 10:19 AM

## 2024-01-01 ENCOUNTER — Other Ambulatory Visit: Payer: Self-pay

## 2024-01-01 ENCOUNTER — Other Ambulatory Visit (HOSPITAL_BASED_OUTPATIENT_CLINIC_OR_DEPARTMENT_OTHER): Payer: Self-pay

## 2024-01-02 ENCOUNTER — Encounter: Payer: Self-pay | Admitting: Family Medicine

## 2024-01-03 ENCOUNTER — Encounter: Payer: Self-pay | Admitting: Neurosurgery

## 2024-01-05 ENCOUNTER — Inpatient Hospital Stay: Attending: Internal Medicine

## 2024-01-05 ENCOUNTER — Other Ambulatory Visit: Payer: Self-pay | Admitting: Radiation Therapy

## 2024-01-05 DIAGNOSIS — I10 Essential (primary) hypertension: Secondary | ICD-10-CM | POA: Insufficient documentation

## 2024-01-05 DIAGNOSIS — Z818 Family history of other mental and behavioral disorders: Secondary | ICD-10-CM | POA: Insufficient documentation

## 2024-01-05 DIAGNOSIS — Z8249 Family history of ischemic heart disease and other diseases of the circulatory system: Secondary | ICD-10-CM | POA: Insufficient documentation

## 2024-01-05 DIAGNOSIS — Z79899 Other long term (current) drug therapy: Secondary | ICD-10-CM | POA: Insufficient documentation

## 2024-01-05 DIAGNOSIS — Z888 Allergy status to other drugs, medicaments and biological substances status: Secondary | ICD-10-CM | POA: Insufficient documentation

## 2024-01-05 DIAGNOSIS — Z83438 Family history of other disorder of lipoprotein metabolism and other lipidemia: Secondary | ICD-10-CM | POA: Insufficient documentation

## 2024-01-05 DIAGNOSIS — Z90722 Acquired absence of ovaries, bilateral: Secondary | ICD-10-CM | POA: Insufficient documentation

## 2024-01-05 DIAGNOSIS — C719 Malignant neoplasm of brain, unspecified: Secondary | ICD-10-CM

## 2024-01-05 DIAGNOSIS — Z825 Family history of asthma and other chronic lower respiratory diseases: Secondary | ICD-10-CM | POA: Insufficient documentation

## 2024-01-05 DIAGNOSIS — Z9079 Acquired absence of other genital organ(s): Secondary | ICD-10-CM | POA: Insufficient documentation

## 2024-01-05 DIAGNOSIS — Z833 Family history of diabetes mellitus: Secondary | ICD-10-CM | POA: Insufficient documentation

## 2024-01-05 DIAGNOSIS — G4700 Insomnia, unspecified: Secondary | ICD-10-CM | POA: Insufficient documentation

## 2024-01-05 DIAGNOSIS — K760 Fatty (change of) liver, not elsewhere classified: Secondary | ICD-10-CM | POA: Insufficient documentation

## 2024-01-05 DIAGNOSIS — K589 Irritable bowel syndrome without diarrhea: Secondary | ICD-10-CM | POA: Insufficient documentation

## 2024-01-05 DIAGNOSIS — G4730 Sleep apnea, unspecified: Secondary | ICD-10-CM | POA: Insufficient documentation

## 2024-01-05 DIAGNOSIS — Z8349 Family history of other endocrine, nutritional and metabolic diseases: Secondary | ICD-10-CM | POA: Insufficient documentation

## 2024-01-05 DIAGNOSIS — Z9049 Acquired absence of other specified parts of digestive tract: Secondary | ICD-10-CM | POA: Insufficient documentation

## 2024-01-05 DIAGNOSIS — Z7952 Long term (current) use of systemic steroids: Secondary | ICD-10-CM | POA: Insufficient documentation

## 2024-01-05 DIAGNOSIS — C712 Malignant neoplasm of temporal lobe: Secondary | ICD-10-CM | POA: Insufficient documentation

## 2024-01-05 DIAGNOSIS — E1165 Type 2 diabetes mellitus with hyperglycemia: Secondary | ICD-10-CM | POA: Insufficient documentation

## 2024-01-05 DIAGNOSIS — Z90710 Acquired absence of both cervix and uterus: Secondary | ICD-10-CM | POA: Insufficient documentation

## 2024-01-05 DIAGNOSIS — Z885 Allergy status to narcotic agent status: Secondary | ICD-10-CM | POA: Insufficient documentation

## 2024-01-05 DIAGNOSIS — Z7982 Long term (current) use of aspirin: Secondary | ICD-10-CM | POA: Insufficient documentation

## 2024-01-05 NOTE — Telephone Encounter (Signed)
 Noted

## 2024-01-06 ENCOUNTER — Encounter

## 2024-01-06 ENCOUNTER — Telehealth: Payer: Self-pay | Admitting: Internal Medicine

## 2024-01-06 NOTE — Telephone Encounter (Signed)
 Scheduled appointment per 8/18 secure chat. Talked with the patient daughter and she is aware of the made appointment.

## 2024-01-07 ENCOUNTER — Telehealth: Payer: Self-pay | Admitting: *Deleted

## 2024-01-07 NOTE — Telephone Encounter (Signed)
 Returned patient's sister's phone call- Rudolph Hamilton, spoke with Ms. Hamilton

## 2024-01-07 NOTE — Telephone Encounter (Signed)
 Paperwork placed on Dr.Janjua's desk to review on 01/09/2024.

## 2024-01-08 ENCOUNTER — Inpatient Hospital Stay (HOSPITAL_BASED_OUTPATIENT_CLINIC_OR_DEPARTMENT_OTHER): Admitting: Internal Medicine

## 2024-01-08 VITALS — BP 105/75 | HR 71 | Temp 97.7°F | Resp 20

## 2024-01-08 DIAGNOSIS — G4730 Sleep apnea, unspecified: Secondary | ICD-10-CM | POA: Diagnosis not present

## 2024-01-08 DIAGNOSIS — G4700 Insomnia, unspecified: Secondary | ICD-10-CM | POA: Diagnosis not present

## 2024-01-08 DIAGNOSIS — Z825 Family history of asthma and other chronic lower respiratory diseases: Secondary | ICD-10-CM

## 2024-01-08 DIAGNOSIS — E1165 Type 2 diabetes mellitus with hyperglycemia: Secondary | ICD-10-CM

## 2024-01-08 DIAGNOSIS — Z90722 Acquired absence of ovaries, bilateral: Secondary | ICD-10-CM | POA: Diagnosis not present

## 2024-01-08 DIAGNOSIS — Z833 Family history of diabetes mellitus: Secondary | ICD-10-CM

## 2024-01-08 DIAGNOSIS — Z9079 Acquired absence of other genital organ(s): Secondary | ICD-10-CM

## 2024-01-08 DIAGNOSIS — Z8249 Family history of ischemic heart disease and other diseases of the circulatory system: Secondary | ICD-10-CM

## 2024-01-08 DIAGNOSIS — C719 Malignant neoplasm of brain, unspecified: Secondary | ICD-10-CM

## 2024-01-08 DIAGNOSIS — Z888 Allergy status to other drugs, medicaments and biological substances status: Secondary | ICD-10-CM | POA: Diagnosis not present

## 2024-01-08 DIAGNOSIS — Z9049 Acquired absence of other specified parts of digestive tract: Secondary | ICD-10-CM

## 2024-01-08 DIAGNOSIS — K760 Fatty (change of) liver, not elsewhere classified: Secondary | ICD-10-CM

## 2024-01-08 DIAGNOSIS — Z7982 Long term (current) use of aspirin: Secondary | ICD-10-CM | POA: Diagnosis not present

## 2024-01-08 DIAGNOSIS — Z7952 Long term (current) use of systemic steroids: Secondary | ICD-10-CM

## 2024-01-08 DIAGNOSIS — I10 Essential (primary) hypertension: Secondary | ICD-10-CM

## 2024-01-08 DIAGNOSIS — Z885 Allergy status to narcotic agent status: Secondary | ICD-10-CM

## 2024-01-08 DIAGNOSIS — Z83438 Family history of other disorder of lipoprotein metabolism and other lipidemia: Secondary | ICD-10-CM

## 2024-01-08 DIAGNOSIS — Z8349 Family history of other endocrine, nutritional and metabolic diseases: Secondary | ICD-10-CM | POA: Diagnosis not present

## 2024-01-08 DIAGNOSIS — Z79899 Other long term (current) drug therapy: Secondary | ICD-10-CM | POA: Diagnosis not present

## 2024-01-08 DIAGNOSIS — C712 Malignant neoplasm of temporal lobe: Secondary | ICD-10-CM

## 2024-01-08 DIAGNOSIS — Z90710 Acquired absence of both cervix and uterus: Secondary | ICD-10-CM | POA: Diagnosis not present

## 2024-01-08 DIAGNOSIS — K589 Irritable bowel syndrome without diarrhea: Secondary | ICD-10-CM

## 2024-01-08 DIAGNOSIS — Z818 Family history of other mental and behavioral disorders: Secondary | ICD-10-CM | POA: Diagnosis not present

## 2024-01-08 MED ORDER — DEXAMETHASONE 4 MG PO TABS: ORAL_TABLET | ORAL | Status: AC

## 2024-01-08 NOTE — Progress Notes (Signed)
 Radiation Oncology         [phone]) [phone] ________________________________  Initial Outpatient Consultation  Name: Alexandra Walker MRN: [id number]  Date: 01/09/2024  DOB: 1956/03/26  RR:Jwib, Lavern LITTIE, MD  Buckley Arthea POUR, MD   REFERRING PHYSICIAN: Buckley Arthea POUR, MD  DIAGNOSIS: No diagnosis found.   Cancer Staging  No matching staging information was found for the patient.  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 female who initially presented to the ED on 12/24/23 with c/o of a mild headache and lightheadedness x 5 days. Her work-up was overall unremarkable other than labs showing concern for UTI and she was prescribed a course of abx at discharge (of note: imaging of the head was not performed at that time). Of additional note: She had also had an MRA of the head on 12/02/23 in the setting of ice pick headaches which showed no acute findings.   Several days after her initial encounter, she returned to the ED on 12/26/23 with persistent headaches and dizziness. An MRI of the brain without contrast was performed at that time which revealed: a large tumor in the posterior right temporal/occipital lobe measuring approximately 4.0 x 3.3 x 3.5 cm, with surrounding vasogenic edema, mass effect, and 1.1 cm of leftward midline shift. The noted edema appeared to extend into the right optic tract. Additional findings included prominence of the CSF spaces in the left optic nerve sheath posterior to the left globe, and CSF cleft suggestive of a possible extra-axial mass lesion. Imaging findings otherwise showed no evidence of acute infarct or hemorrhage.   Based on these findings, she was accordingly admitted that day and underwent an MRI of the brain with contrast which redemonstrated: the posterior right temporal lobe mass highly  suspicious for high-grade primary glial neoplasm with avid peripheral enhancement and central necrosis measuring approximately 4.5 x 3.9 cm, with associated dural enhancement extending anteriorly along the right temporal convexity. The previously seen vasogenic edema was also redemonstrated, resulting in 10 mm of leftward midline shift, right uncal herniation, and downward transtentorial herniation.   Further imaging performed included a CT CAP with contrast on 08/09 which demonstrated: no evidence of metastatic disease in the chest, abdomen or pelvis, and a known 1.6 cm heterogeneous nodule in the right lobe of the thyroid  gland (biopsied on 07/11 and showed atypia of undetermined significance). Other findings of potential clinical significance included: moderate hepatic steatosis with a 1.9 cm cyst in segment 2, previously measuring 1 cm; nonobstructive nephrolithiasis; diverticulosis without evidence of diverticulitis; osteopenia w/ degenerative changes; and a small umbilical fat hernia.   She was accordingly evaluated by neurosurgery and underwent a right craniotomy for resection of the posterior right temporal lobe mass on 12/29/23 under the care of Dr. Rosslyn. 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.   Post-op MRI of the brain on 08/11 showed: postoperative changes s/p interval right temporal craniotomy for tumor resection without evidence of residual enhancing tumor or other complication. Imaging findings also included: a persistent T2/FLAIR signal abnormality throughout the adjacent right temporal lobe, consistent with edema and/or nonenhancing infiltrative tumor (slightly improved from prior), and regional mass effect with 6 mm of right-to-left shift, (also slightly improved from prior). No other acute intracranial abnormalities were demonstrated.   She was brought to the neuro ICU after her procedure and started  on Keppra  and Decadron  which she was also discharged  with on 01/05/24.    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/2012), Diabetes (HCC), Diverticulosis, Fatty liver, GERD (gastroesophageal reflux disease), Heart murmur, Hepatic steatosis, History of hiatal hernia (2017), HLA B27 (HLA B27 positive), pancreatitis, Hyperlipidemia, Hypertension, IBS (irritable bowel syndrome), Ischemic colitis (HCC), Lactose intolerance, Lumbar scoliosis (2010), Migraine headache without aura (05/10/2015), Multiple thyroid  nodules, Nephrolithiasis (2014), Obesity due to excess calories with serious comorbidity (05/24/2019), Palpitations, PONV (postoperative nausea and vomiting), Prediabetes, Presbyopia, Right bundle branch block, Sepsis (HCC) (01/2016), Sleep apnea, Sleep hypopnea, and Vitamin D  deficiency.    PAST SURGICAL HISTORY: Past Surgical History:  Procedure Laterality Date   APPLICATION OF CRANIAL NAVIGATION N/A 12/29/2023   Procedure: COMPUTER-ASSISTED NAVIGATION, FOR CRANIAL PROCEDURE;  Surgeon: Rosslyn Dino HERO, MD;  Location: [facility] OR;  Service: Neurosurgery;  Laterality: N/A;   BREAST BIOPSY Left    CERVICAL CONE BIOPSY     CHOLECYSTECTOMY N/A 10/26/2014   Procedure: LAPAROSCOPIC CHOLECYSTECTOMY;  Surgeon: Vicenta Poli, MD;  Location: [facility];  Service: General;  Laterality: N/A;   COLONOSCOPY W/ BIOPSIES  multiple   CRANIOTOMY Right 12/29/2023   Procedure: CRANIOTOMY TUMOR EXCISION;  Surgeon: Rosslyn Dino HERO, MD;  Location: [facility] OR;  Service: Neurosurgery;  Laterality: Right;   CYSTOSCOPY WITH RETROGRADE PYELOGRAM, URETEROSCOPY AND STENT PLACEMENT Left 06/26/2018   Procedure:  CYSTOSCOPY WITH RETROGRADE PYELOGRAM, URETEROSCOPY AND STENT PLACEMENT;  Surgeon: Alvaro Hummer, MD;  Location: [facility];  Service: Urology;  Laterality: Left;   DIAGNOSTIC LAPAROSCOPY     DILATION AND CURETTAGE OF UTERUS  11/2009   DUPUYTREN CONTRACTURE RELEASE Left    x 3 , Dr Camella   ESOPHAGOGASTRODUODENOSCOPY  multiple   EYE SURGERY     HOLMIUM LASER APPLICATION Left 06/26/2018   Procedure: HOLMIUM LASER APPLICATION;  Surgeon: Alvaro Hummer, MD;  Location: [facility];  Service: Urology;  Laterality: Left;   KNEE ARTHROSCOPY Left 11/2008   LAPAROSCOPIC VAGINAL HYSTERECTOMY WITH SALPINGO OOPHORECTOMY Bilateral 10/03/2015   Procedure: LAPAROSCOPIC ASSISTED VAGINAL HYSTERECTOMY WITH SALPINGECTOMY;  Surgeon: Shanda SHAUNNA Muscat, MD;  Location: [facility];  Service: Gynecology;  Laterality: Bilateral;   LASIK  1990   RK   LEFT FINGER SURGERY     LIVER BIOPSY     MASS EXCISION Left 12/24/2012   Procedure: LEFT EXCISION OF PALMAR MASS X TWO;  Surgeon: Elsie Camella, MD;  Location: [facility];  Service: Orthopedics;  Laterality: Left;   PHOTOREFRACTIVE KERATOTOMY  1994   RADIAL KERATOTOMY  1994   TRANSOBTURATOR SLING      FAMILY HISTORY: family history includes Coronary artery disease in her mother; Depression in her mother; Diabetes in her mother; Heart attack in her father and mother; Heart disease in her father and mother; Hyperlipidemia in her mother; Hypertension in her brother, mother, and sister; Kidney disease in her mother; Obesity in her mother; Sleep apnea in her mother; Sudden death in her father.  SOCIAL HISTORY:  reports that she has never smoked. She has never used smokeless tobacco. She reports current alcohol use. She reports that she does not use drugs.  ALLERGIES: Ace inhibitors, Dust mite extract, Oxycodone  hcl, Oxycodone -acetaminophen , Tree extract, Vicodin [  hydrocodone-acetaminophen ], Victoza [liraglutide], and Adhesive  [tape]  MEDICATIONS:  Current Outpatient Medications  Medication Sig Dispense Refill   amphetamine -dextroamphetamine  (ADDERALL) 10 MG tablet Take 1 tablet (10 mg total) by mouth daily as needed. (Patient taking differently: Take 5 mg by mouth daily as needed (For ADHD).) 30 tablet 0   aspirin  325 MG tablet Take 325 mg by mouth daily.     baclofen  (LIORESAL ) 10 MG tablet Take 1 tablet (10 mg total) by mouth 2 (two) times daily. 30 each 0   butalbital -acetaminophen -caffeine  (FIORICET ) 50-325-40 MG tablet Take 2 tablets by mouth every 4 (four) hours as needed for headache (moderate headache). 14 tablet 0   cholecalciferol  (VITAMIN D ) 1000 UNITS tablet Take 2,000 Units by mouth daily.      cyanocobalamin  (VITAMIN B12) 1000 MCG tablet Take 1,000 mcg by mouth daily.     dexamethasone  (DECADRON ) 4 MG tablet Take 1 tablet (4 mg total) by mouth 2 (two) times daily with a meal. 90 tablet 3   estradiol  (ESTRACE ) 0.1 MG/GM vaginal cream Place 1 gram vaginally 2 (two) times a week. (Patient taking differently: Place 1 g vaginally as needed.) 42.5 g 1   fluticasone  (FLONASE ) 50 MCG/ACT nasal spray Place 1 spray into both nostrils in the morning and at bedtime. (Patient taking differently: Place 1 spray into both nostrils 2 (two) times daily as needed for allergies.) 16 g 6   furosemide  (LASIX ) 40 MG tablet Take 1 tablet (40 mg total) by mouth daily. (Patient taking differently: Take 40 mg by mouth daily as needed for fluid.) 90 tablet 1   levETIRAcetam  (KEPPRA ) 500 MG tablet Take 1 tablet (500 mg total) by mouth 2 (two) times daily. 180 tablet 3   losartan  (COZAAR ) 100 MG tablet Take 1 tablet (100 mg total) by mouth daily. 90 tablet 3   Lutein 6 MG CAPS Take 6 mg by mouth daily.     [Paused] metoprolol  succinate (TOPROL -XL) 100 MG 24 hr tablet Take 1 tablet (100 mg total) by mouth daily with or immediately following a meal. 90 tablet 2   mometasone  (ELOCON ) 0.1 % cream Apply 1 application topically 2 (two)  times a week. (Patient taking differently: Apply 1 Application topically as needed (rash).) 45 g 1   Multiple Vitamin (MULTIVITAMIN WITH MINERALS) TABS tablet Take 1 tablet by mouth daily.     potassium chloride  SA (KLOR-CON  M) 20 MEQ tablet Take 1 tablet (20 mEq total) by mouth daily. (Patient taking differently: Take 20 mEq by mouth as needed.) 90 tablet 1   rosuvastatin  (CRESTOR ) 20 MG tablet Take 1 tablet (20 mg total) by mouth daily. 90 tablet 3   tirzepatide  (MOUNJARO ) 10 MG/0.5ML Pen Inject 10 mg into the skin once a week. 2 mL 5   No current facility-administered medications for this encounter.    REVIEW OF SYSTEMS:  Notable for that above.   PHYSICAL EXAM:  vitals were not taken for this visit.   General: Alert and oriented, in no acute distress *** HEENT: Head is normocephalic. Extraocular movements are intact. Oropharynx is clear. Neck: Neck is supple, no palpable cervical or supraclavicular lymphadenopathy. Heart: Regular in rate and rhythm with no murmurs, rubs, or gallops. Chest: Clear to auscultation bilaterally, with no rhonchi, wheezes, or rales. Abdomen: Soft, nontender, nondistended, with no rigidity or guarding. Extremities: No cyanosis or edema. Lymphatics: see Neck Exam Skin: No concerning lesions. Musculoskeletal: symmetric strength and muscle tone throughout. Neurologic: Cranial nerves II through XII are grossly intact. No obvious  focalities. Speech is fluent. Coordination is intact. Psychiatric: Judgment and insight are intact. Affect is appropriate.   ECOG = ***  0 - Asymptomatic (Fully active, able to carry on all predisease activities without restriction)  1 - Symptomatic but completely ambulatory (Restricted in physically strenuous activity but ambulatory and able to carry out work of a light or sedentary nature. For example, light housework, office work)  2 - Symptomatic, <50% in bed during the day (Ambulatory and capable of all self care but unable to  carry out any work activities. Up and about more than 50% of waking hours)  3 - Symptomatic, >50% in bed, but not bedbound (Capable of only limited self-care, confined to bed or chair 50% or more of waking hours)  4 - Bedbound (Completely disabled. Cannot carry on any self-care. Totally confined to bed or chair)  5 - Death   Raylene MM, Creech RH, Tormey DC, et al. [phone]). Toxicity and response criteria of the [facility]. Am. DOROTHA Bridges. Oncol. 5 (6): 649-55   LABORATORY DATA:  Lab Results  Component Value Date   WBC 8.9 12/30/2023   HGB 13.2 12/30/2023   HCT 40.5 12/30/2023   MCV 84.6 12/30/2023   PLT 196 12/30/2023   CMP     Component Value Date/Time   NA 135 12/30/2023 0503   NA 139 11/26/2023 1155   K 4.4 12/30/2023 0503   CL 107 12/30/2023 0503   CO2 19 (L) 12/30/2023 0503   GLUCOSE 203 (H) 12/30/2023 0503   BUN 15 12/30/2023 0503   BUN 16 11/26/2023 1155   CREATININE 0.56 12/30/2023 0503   CREATININE 0.76 05/05/2020 1631   CALCIUM  8.3 (L) 12/30/2023 0503   PROT 6.6 12/27/2023 0344   PROT 6.3 09/03/2021 1219   ALBUMIN 3.4 (L) 12/27/2023 0344   ALBUMIN 4.1 09/03/2021 1219   AST 20 12/27/2023 0344   ALT 24 12/27/2023 0344   ALKPHOS 71 12/27/2023 0344   BILITOT 0.4 12/27/2023 0344   BILITOT 0.4 09/03/2021 1219   GFR 91.33 09/11/2023 0939   EGFR 90 11/26/2023 1155   GFRNONAA >60 12/30/2023 0503         RADIOGRAPHY: MR BRAIN W WO CONTRAST Result Date: 12/30/2023 CLINICAL DATA:  Follow-up examination for brain/CNS neoplasm, monitor. EXAM: MRI HEAD WITHOUT AND WITH CONTRAST TECHNIQUE: Multiplanar, multiecho pulse sequences of the brain and surrounding structures were obtained without and with intravenous contrast. CONTRAST:  10mL GADAVIST  GADOBUTROL  1 MMOL/ML IV SOLN COMPARISON:  Prior MRI from 12/26/2023 FINDINGS: Brain: Postoperative changes from interval right temporal craniotomy for tumor resection. Postoperative minimal enhancement about the  resection cavity felt to be most consistent with postoperative change slightly improved. Regional mass effect with 6 mm of right-to-left shift, also slightly improved. No hydrocephalus or ventricular trapping. Basilar cisterns remain patent. No other mass lesion or abnormal enhancement. No evidence for acute or subacute infarct. No other acute or chronic intracranial blood products. Additional scattered nonspecific cerebral white matter changes noted, nonspecific, but most commonly related to chronic microvascular ischemic disease, stable. Pituitary gland within normal limits. No other abnormal enhancement. Vascular: Major intracranial vascular flow voids are maintained. Skull and upper cervical spine: Craniocervical junction within normal limits. Bone marrow signal intensity within normal limits. Post craniotomy changes at the right temporal scalp. No other acute finding. Sinuses/Orbits: Globes orbital soft tissues within normal limits. Paranasal sinuses are clear. No significant mastoid effusion. Other: None. IMPRESSION: 1. Postoperative changes from interval right temporal craniotomy for tumor resection.  No MRI evidence for residual enhancing tumor or other complication. 2. Persistent T2/FLAIR signal abnormality throughout the adjacent right temporal lobe, consistent with edema and/or nonenhancing infiltrative tumor, slightly improved from prior. Regional mass effect with 6 mm of right-to-left shift, also slightly improved. 3. No other acute intracranial abnormality. Electronically Signed   By: Morene Hoard M.D.   On: 12/30/2023 02:14   CT CHEST ABDOMEN PELVIS W CONTRAST Result Date: 12/27/2023 CLINICAL DATA:  Metastatic evaluation. Right mid thyroid  indeterminate nodule FNA 11/28/2023 demonstrated atypia. Sample was submitted for Afirma testing which is currently unavailable. EXAM: CT CHEST, ABDOMEN, AND PELVIS WITH CONTRAST TECHNIQUE: Multidetector CT imaging of the chest, abdomen and pelvis was  performed following the standard protocol during bolus administration of intravenous contrast. RADIATION DOSE REDUCTION: This exam was performed according to the departmental dose-optimization program which includes automated exposure control, adjustment of the mA and/or kV according to patient size and/or use of iterative reconstruction technique. CONTRAST:  75mL OMNIPAQUE  IOHEXOL  350 MG/ML SOLN COMPARISON:  CT abdomen pelvis without contrast 04/20/2018, partial chest CT without contrast for coronary calcium  scoring 02/12/2016. FINDINGS: CT CHEST FINDINGS Cardiovascular: There are trace single-vessel calcific plaques in proximal LAD coronary artery. The cardiac size is normal. There is no pericardial effusion. Pulmonary arteries and veins are normal caliber. No central arterial embolus is seen. There is tortuosity in the descending aorta, scattered calcific plaques. The great vessels are clear. There is no aortic aneurysm, stenosis or dissection. Mediastinum/Nodes: There is a heterogeneous 1.6 cm nodule in the right lobe of the thyroid  gland. By prior report this was the nodule which underwent biopsy. There are additional subcentimeter hypodense nodules in right lobe of the thyroid . The left lobe is unremarkable. Axillary spaces are clear. No intrathoracic adenopathy is seen. Negative thoracic esophagus, thoracic trachea, both main bronchi. Lungs/Pleura: No infiltrate or nodules are seen. There is no pleural effusion or pneumothorax. Musculoskeletal: Osteopenia and degenerative change thoracic spine. No concerning regional bone lesions. CT ABDOMEN PELVIS FINDINGS Hepatobiliary: There is a 1.9 cm cyst in hepatic segment 2, Hounsfield density is 15. This was previously 1 cm. No follow-up imaging is recommended. The liver is moderately steatotic measuring 20 cm length, with focal periligamentous fat in segment 4 B. There is no liver mass enhancement. Gallbladder is absent as before without biliary dilatation.  Pancreas: No abnormality. Spleen: No abnormality. Adrenals/Urinary Tract: There is mild chronic nodular thickening left adrenal gland, stable. Right adrenal gland is unremarkable. Small scattered cortical scar-like defects are noted both kidneys. There are occasional bilateral subcentimeter Bosniak 2 cortical cysts in both kidneys which are too small to characterize. No follow-up imaging is recommended. JACR 2018 Feb; 264-273, Management of the Incidental Renal Mass on CT, [employer] 2021; 814-848, Bosniak Classification of Cystic Renal Masses, Version 2019. There is a 3 mm nonobstructive caliceal stone in the inferior pole of the right kidney, and a 2 mm nonobstructive caliceal stone in the inferior pole on the left. There is no hydronephrosis or ureteral stone. There is symmetric renal excretion on delayed images. There is no renal mass enhancement. Bladder is unremarkable. Stomach/Bowel: No dilatation or wall thickening including the appendix. Uncomplicated sigmoid diverticulosis. Vascular/Lymphatic: Aortic atherosclerosis. No enlarged abdominal or pelvic lymph nodes. Reproductive: Status post hysterectomy. No adnexal masses. Other: Small umbilical fat hernia. Subcutaneous gas anterior left abdominal wall at the level of the umbilicus consistent with recent injection. There is no incarcerated hernia no free fluid, free hemorrhage or free air. Musculoskeletal: Stable 1.7 cm densely sclerotic  lesion lower right ilium. Mild chronic sacroiliitis. Osteopenia and degenerative change thoracic spine. No worrisome regional bone lesion. Partial hemangiomatous replacement L5 vertebral body. IMPRESSION: 1. No evidence of metastatic disease in the chest, abdomen or pelvis. 2. 1.6 cm heterogeneous nodule in the right lobe of the thyroid  gland. By prior report this was the nodule which underwent biopsy. I do not have the Afirma results from the biopsy but the initial pathology report does indicate atypia. 3. Aortic and  coronary artery atherosclerosis. 4. Moderate hepatic steatosis with 1.9 cm cyst in segment 2, previously 1 cm. 5. Nonobstructive nephrolithiasis. 6. Diverticulosis without evidence of diverticulitis. 7. Osteopenia and degenerative change. 8. Small umbilical fat hernia. Aortic Atherosclerosis (ICD10-I70.0). Electronically Signed   By: Francis Quam M.D.   On: 12/27/2023 21:02   MR BRAIN W WO CONTRAST Result Date: 12/26/2023 EXAM: MRI BRAIN WITH AND WITHOUT CONTRAST 12/26/2023 06:38:00 PM TECHNIQUE: Multiplanar multisequence MRI of the head/brain was performed with and without the administration of intravenous contrast. COMPARISON: MRI brain 12/26/23 CLINICAL HISTORY: Headache, new onset (Age >= 51y); stealth protocol. Right posterior temporal lobe mass, possibly extra-axial. There is associated edema in the right temporal and parietal lobes. FINDINGS: BRAIN AND VENTRICLES: No acute infarct. No acute intracranial hemorrhage. The previously described mass in the posterior right temporal lobe demonstrates avid peripheral enhancement with central necrosis. The mass measures up to 4.5 x 3.9 cm on axial image 64, series 6. There is some associated dural enhancement extending anteriorly along the right temporal convexity. Unchanged surrounding vasogenic edema and 10 mm of leftward midline shift. Unchanged right uncal herniation and downward transtentorial herniation. ORBITS: No acute abnormality. SINUSES: No acute abnormality. BONES AND SOFT TISSUES: Normal bone marrow signal and enhancement. No acute soft tissue abnormality. IMPRESSION: 1. Posterior right temporal lobe mass with avid peripheral enhancement and central necrosis, measuring up to 4.5 x 3.9 cm, with associated dural enhancement extending anteriorly along the right temporal convexity. 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 2. Unchanged surrounding vasogenic edema, 10 mm of leftward midline shift, right uncal herniation, and downward  transtentorial herniation. Electronically signed by: Ryan Chess MD 12/26/2023 07:01 PM EDT RP Workstation: [id number]   MR BRAIN WO CONTRAST Addendum Date: 12/26/2023 ******** ADDENDUM #1 ******** ADDENDUM: Findings discussed with Tinnie Matter, PA at 4:06 PM on 12/26/23. ---------------------------------------------------- Electronically signed by: Donnice Mania MD 12/26/2023 04:17 PM EDT RP Workstation: [id number]   Result Date: 12/26/2023 ******** ORIGINAL REPORT ******** EXAM: MRI BRAIN WITHOUT CONTRAST 12/26/2023 03:37:44 PM TECHNIQUE: Multiplanar multisequence MRI of the head/brain was performed without the administration of intravenous contrast. COMPARISON: MRA head dated 12/02/2023. CLINICAL HISTORY: Headache, new onset (Age >= 51y). Headache; Dizziness. Confusion. FINDINGS: BRAIN AND VENTRICLES: There is a heterogeneous mass along the lateral aspect of the right temporal occipital lobes which measures approximately 4.0 x 3.3 x 3.5 cm with surrounding vasogenic edema within the right temporal lobe extending into the lateral aspect of the right occipital lobe and the inferior right parietal lobe. There is associated mass effect and sulcal effacement with approximately 1.1 cm leftward midline shift. There is a possible CSF cleft around the mass most apparent on series 13 image 12 which may suggest extraaxial lesion. The mass is relatively heterogeneous with multiple internal flow voids visualized. Additional focus of edema involving the anterolateral cortex of the right temporal lobe without restricted diffusion to suggest infarct. There is no evidence of acute infarct or acute hemorrhage. Nonspecific scattered foci of T2/FLAIR intensity in the periventricular  and subcortical white matter. Edema also noted extending into the posterolateral right internal capsule as well as into the right thalamus. There is crowding of the ambient cisterns more so on the right and crowding of the right aspect of the  quadrigeminal cistern. There is also focal edema noted involving the right optic tract seen on series 10 image 21. Partial effacement of the right lateral ventricle. ORBITS: Prominence of CSF spaces within the left optic nerve sheath along the posterior aspect of the left globe. SINUSES AND MASTOIDS: No acute abnormality. BONES AND SOFT TISSUES: Normal marrow signal. No acute soft tissue abnormality. IMPRESSION: 1. Heterogeneous mass along the lateral aspect of the right temporal occipital lobes measuring approximately 4.0 x 3.3 x 3.5 cm, with surrounding vasogenic edema, mass effect, and 1.1 cm leftward midline shift. 2. CSF cleft suggesting possible extra-axial mass lesion. 3. MRI with contrast is recommended for further evaluation. 4. Edema extends into the right optic tract. Prominence of CSF spaces in the left optic nerve sheath posterior to the left globe. Recommend correlation with fundoscopic exam. 5. No evidence of acute infarct or hemorrhage. Electronically signed by: Donnice Mania MD 12/26/2023 03:53 PM EDT RP Workstation: [id number]      IMPRESSION/PLAN:***    On date of service, in total, I spent *** minutes on this encounter. Patient was seen in person.   __________________________________________   Lauraine Golden, MD  This document serves as a record of services personally performed by Lauraine Golden, MD. It was created on her behalf by Dorthy Fuse, a trained medical scribe. The creation of this record is based on the scribe's personal observations and the provider's statements to them. This document has been checked and approved by the attending provider.

## 2024-01-08 NOTE — Progress Notes (Signed)
 [facility] at [facility] [address]  [place], [place] [address] [phone]   New Patient Evaluation  Date of Service: 01/08/24 Patient Name: Alexandra Walker Patient MRN: [id number] Patient DOB: 1956/05/02 Provider: Arthea MARLA Manns, MD  Identifying Statement:  Alexandra Walker is a 68 y.o. female with right temporal glioblastoma who presents for initial consultation and evaluation.    Referring Provider: Jodie Lavern LITTIE, MD [address] [place],  [place] [address]  Oncologic History: Oncology History  Glioblastoma, IDH-wildtype [facility])  12/29/2023 Surgery   Right temporal craniotomy, resection with Dr. Rosslyn; path is grade 4 glioma     Biomarkers:  MGMT Unknown.  IDH 1/2 Unknown.  EGFR Unknown  TERT Unknown   History of Present Illness: The patient's records from the referring physician were obtained and reviewed and the patient interviewed to confirm this HPI.  Alexandra Walker presented to neurologic attention with several days history of difficulty driving/navigating, some disorganization at work, and headaches.  CNS imaging demonstrated an enhancing mass within the right temporal lobe, c/w primary brain tumor.  She underwent craniotomy, resection with Dr. Rosslyn on 12/29/23; pathology demonstrated grade 4 glioma, IDH pending.  Since surgery, she feels closer to her prior baseline, independent with gait.  Decadron  is at 4mg  twice per day, which has caused some insomnia and elevated blood sugar.  Still on post-op Keppra .    Medications: Current Outpatient Medications on File Prior to Visit  Medication Sig Dispense Refill   amphetamine -dextroamphetamine  (ADDERALL) 10 MG tablet Take 1 tablet (10 mg total) by mouth daily as needed. (Patient taking differently: Take 5 mg by mouth daily as needed (For ADHD).) 30 tablet 0   aspirin  325 MG tablet Take 325 mg by mouth daily.     baclofen  (LIORESAL ) 10 MG tablet Take 1 tablet (10 mg total) by  mouth 2 (two) times daily. 30 each 0   butalbital -acetaminophen -caffeine  (FIORICET ) 50-325-40 MG tablet Take 2 tablets by mouth every 4 (four) hours as needed for headache (moderate headache). 14 tablet 0   cholecalciferol  (VITAMIN D ) 1000 UNITS tablet Take 2,000 Units by mouth daily.      cyanocobalamin  (VITAMIN B12) 1000 MCG tablet Take 1,000 mcg by mouth daily.     dexamethasone  (DECADRON ) 4 MG tablet Take 1 tablet (4 mg total) by mouth 2 (two) times daily with a meal. 90 tablet 3   estradiol  (ESTRACE ) 0.1 MG/GM vaginal cream Place 1 gram vaginally 2 (two) times a week. (Patient taking differently: Place 1 g vaginally as needed.) 42.5 g 1   fluticasone  (FLONASE ) 50 MCG/ACT nasal spray Place 1 spray into both nostrils in the morning and at bedtime. (Patient taking differently: Place 1 spray into both nostrils 2 (two) times daily as needed for allergies.) 16 g 6   furosemide  (LASIX ) 40 MG tablet Take 1 tablet (40 mg total) by mouth daily. (Patient taking differently: Take 40 mg by mouth daily as needed for fluid.) 90 tablet 1   levETIRAcetam  (KEPPRA ) 500 MG tablet Take 1 tablet (500 mg total) by mouth 2 (two) times daily. 180 tablet 3   losartan  (COZAAR ) 100 MG tablet Take 1 tablet (100 mg total) by mouth daily. 90 tablet 3   Lutein 6 MG CAPS Take 6 mg by mouth daily.     [Paused] metoprolol  succinate (TOPROL -XL) 100 MG 24 hr tablet Take 1 tablet (100 mg total) by mouth daily with or immediately following a meal. 90 tablet 2  mometasone  (ELOCON ) 0.1 % cream Apply 1 application topically 2 (two) times a week. (Patient taking differently: Apply 1 Application topically as needed (rash).) 45 g 1   Multiple Vitamin (MULTIVITAMIN WITH MINERALS) TABS tablet Take 1 tablet by mouth daily.     potassium chloride  SA (KLOR-CON  M) 20 MEQ tablet Take 1 tablet (20 mEq total) by mouth daily. (Patient taking differently: Take 20 mEq by mouth as needed.) 90 tablet 1   rosuvastatin  (CRESTOR ) 20 MG tablet Take 1 tablet  (20 mg total) by mouth daily. 90 tablet 3   tirzepatide  (MOUNJARO ) 10 MG/0.5ML Pen Inject 10 mg into the skin once a week. 2 mL 5   No current facility-administered medications on file prior to visit.    Allergies:  Allergies  Allergen Reactions   Ace Inhibitors Other (See Comments)    coughing   Dust Mite Extract Other (See Comments)    Sneezing and congestion   Oxycodone  Hcl Other (See Comments) and Nausea And Vomiting    NDC Rniz:[id number]   Oxycodone -Acetaminophen  Nausea And Vomiting   Tree Extract Other (See Comments)    Tree pollen and scrub pollen    Vicodin [Hydrocodone-Acetaminophen ] Nausea And Vomiting   Victoza [Liraglutide] Other (See Comments)    pancreatitis   Adhesive [Tape] Rash    Not on stomach   Past Medical History:  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   Diabetes (HCC)    Diverticulosis    Minimal   Fatty liver    GERD (gastroesophageal reflux disease)    Heart murmur    Hepatic steatosis    History of hiatal hernia 2017   Small   HLA B27 (HLA B27 positive)    Hx of pancreatitis    Hyperlipidemia    Hypertension    IBS (irritable bowel syndrome)    Ischemic colitis (HCC)    Lactose intolerance    Lumbar scoliosis 2010   Mild   Migraine headache without aura 05/10/2015   Multiple thyroid  nodules    Nephrolithiasis 2014   Left, minimal   Obesity due to excess calories with serious comorbidity 05/24/2019   Palpitations    PONV (postoperative nausea and vomiting)    Prediabetes    Presbyopia    Right bundle branch block    Sepsis (HCC)  01/2016   Sleep apnea    Sleep hypopnea    Vitamin D  deficiency    Past Surgical History:  Past Surgical History:  Procedure Laterality Date   APPLICATION OF CRANIAL NAVIGATION N/A 12/29/2023   Procedure: COMPUTER-ASSISTED NAVIGATION, FOR CRANIAL PROCEDURE;  Surgeon: Rosslyn Dino HERO, MD;  Location: [facility];  Service: Neurosurgery;  Laterality: N/A;   BREAST BIOPSY Left    CERVICAL CONE BIOPSY     CHOLECYSTECTOMY N/A 10/26/2014   Procedure: LAPAROSCOPIC CHOLECYSTECTOMY;  Surgeon: Vicenta Poli, MD;  Location: [facility];  Service: General;  Laterality: N/A;   COLONOSCOPY W/ BIOPSIES  multiple   CRANIOTOMY Right 12/29/2023   Procedure: CRANIOTOMY TUMOR EXCISION;  Surgeon: Rosslyn Dino HERO, MD;  Location:  [facility];  Service: Neurosurgery;  Laterality: Right;   CYSTOSCOPY WITH RETROGRADE PYELOGRAM, URETEROSCOPY AND STENT PLACEMENT Left 06/26/2018   Procedure: CYSTOSCOPY WITH RETROGRADE PYELOGRAM, URETEROSCOPY AND STENT PLACEMENT;  Surgeon: Alvaro Hummer, MD;  Location: [facility];  Service: Urology;  Laterality: Left;   DIAGNOSTIC LAPAROSCOPY     DILATION AND CURETTAGE OF UTERUS  11/2009   DUPUYTREN CONTRACTURE RELEASE Left    x 3 , Dr Camella   ESOPHAGOGASTRODUODENOSCOPY  multiple   EYE SURGERY     HOLMIUM LASER APPLICATION Left 06/26/2018   Procedure: HOLMIUM LASER APPLICATION;  Surgeon: Alvaro Hummer, MD;  Location: [facility];  Service: Urology;  Laterality: Left;   KNEE ARTHROSCOPY Left 11/2008   LAPAROSCOPIC VAGINAL HYSTERECTOMY WITH SALPINGO OOPHORECTOMY Bilateral 10/03/2015   Procedure: LAPAROSCOPIC ASSISTED VAGINAL HYSTERECTOMY WITH SALPINGECTOMY;  Surgeon: Shanda SHAUNNA Muscat, MD;  Location: [facility];  Service: Gynecology;  Laterality: Bilateral;   LASIK  1990   RK   LEFT FINGER SURGERY     LIVER BIOPSY     MASS EXCISION Left 12/24/2012   Procedure: LEFT EXCISION OF PALMAR MASS X TWO;  Surgeon: Elsie Camella, MD;  Location: [facility];  Service: Orthopedics;  Laterality: Left;   PHOTOREFRACTIVE KERATOTOMY  1994   RADIAL KERATOTOMY  1994   TRANSOBTURATOR SLING     Social History:  Social History   Socioeconomic History   Marital status: Divorced    Spouse name: Not on file   Number of children: 1   Years of education: Not on file   Highest education level: Master's degree (e.g., MA, MS, MEng, MEd, MSW, MBA)  Occupational History   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   Social Drivers of Health   Financial Resource Strain: Low Risk  (06/13/2023)   Overall Financial Resource Strain (CARDIA)    Difficulty of Paying Living Expenses: Not hard at all  Food Insecurity: No Food Insecurity (12/27/2023)   Hunger Vital Sign    Worried About Running Out of Food in the Last Year: Never true    Ran Out of Food in the Last Year: Never true  Transportation Needs: No Transportation Needs (12/27/2023)   PRAPARE - Administrator, Civil Service (Medical): No    Lack of Transportation (Non-Medical): No  Physical Activity: Insufficiently Active (06/13/2023)   Exercise Vital Sign    Days of Exercise per Week: 1 day    Minutes of Exercise per Session: 30 min  Stress: No Stress Concern Present (06/13/2023)   [employer] of Occupational Health - Occupational Stress Questionnaire    Feeling of Stress : Only a little  Social Connections: Moderately Isolated (12/27/2023)   Social Connection and Isolation Panel    Frequency of Communication with Friends and  Family: More than three times a week    Frequency of Social Gatherings with Friends and Family: Once a week    Attends Religious Services: More than 4 times per  year    Active Member of Clubs or Organizations: No    Attends Banker Meetings: Never    Marital Status: Divorced  Catering manager Violence: Not At Risk (12/27/2023)   Humiliation, Afraid, Rape, and Kick questionnaire    Fear of Current or Ex-Partner: No    Emotionally Abused: No    Physically Abused: No    Sexually Abused: No   Family History:  Family History  Problem Relation Age of Onset   Coronary artery disease Mother    Hypertension Mother    Heart disease Mother    Heart attack Mother    Diabetes Mother    Hyperlipidemia Mother    Kidney disease Mother    Depression Mother    Sleep apnea Mother    Obesity Mother    Heart attack Father    Heart disease Father    Sudden death Father    Hypertension Sister    Hypertension Brother    Colon cancer Neg Hx    Colon polyps Neg Hx    Esophageal cancer Neg Hx    Rectal cancer Neg Hx    Stomach cancer Neg Hx     Review of Systems: Constitutional: Doesn't report fevers, chills or abnormal weight loss Eyes: Doesn't report blurriness of vision Ears, nose, mouth, throat, and face: Doesn't report sore throat Respiratory: Doesn't report cough, dyspnea or wheezes Cardiovascular: Doesn't report palpitation, chest discomfort  Gastrointestinal:  Doesn't report nausea, constipation, diarrhea GU: Doesn't report incontinence Skin: Doesn't report skin rashes Neurological: Per HPI Musculoskeletal: Doesn't report joint pain Behavioral/Psych: Doesn't report anxiety  Physical Exam: Vitals:   01/08/24 1500  BP: 105/75  Pulse: 71  Resp: 20  Temp: 97.7 F (36.5 C)  SpO2: 98%   KPS: 90. General: Alert, cooperative, pleasant, in no acute distress Head: Normal EENT: No conjunctival injection or scleral icterus.  Lungs: Resp effort normal Cardiac: Regular  rate Abdomen: Non-distended abdomen Skin: No rashes cyanosis or petechiae. Extremities: No clubbing or edema  Neurologic Exam: Mental Status: Awake, alert, attentive to examiner. Oriented to self and environment. Language is fluent with intact comprehension.  Cranial Nerves: Visual acuity is grossly normal. Visual fields are full. Extra-ocular movements intact. No ptosis. Face is symmetric Motor: Tone and bulk are normal. Power is full in both arms and legs. Reflexes are symmetric, no pathologic reflexes present.  Sensory: Intact to light touch Gait: Normal.   Labs: I have reviewed the data as listed    Component Value Date/Time   NA 135 12/30/2023 0503   NA 139 11/26/2023 1155   K 4.4 12/30/2023 0503   CL 107 12/30/2023 0503   CO2 19 (L) 12/30/2023 0503   GLUCOSE 203 (H) 12/30/2023 0503   BUN 15 12/30/2023 0503   BUN 16 11/26/2023 1155   CREATININE 0.56 12/30/2023 0503   CREATININE 0.76 05/05/2020 1631   CALCIUM  8.3 (L) 12/30/2023 0503   PROT 6.6 12/27/2023 0344   PROT 6.3 09/03/2021 1219   ALBUMIN 3.4 (L) 12/27/2023 0344   ALBUMIN 4.1 09/03/2021 1219   AST 20 12/27/2023 0344   ALT 24 12/27/2023 0344   ALKPHOS 71 12/27/2023 0344   BILITOT 0.4 12/27/2023 0344   BILITOT 0.4 09/03/2021 1219   GFRNONAA >60 12/30/2023 0503   GFRAA >60 04/20/2018 1323   Lab Results  Component Value Date   WBC 8.9 12/30/2023   NEUTROABS 15.0 (H) 12/28/2023   HGB 13.2 12/30/2023   HCT 40.5 12/30/2023   MCV 84.6 12/30/2023  PLT 196 12/30/2023    Imaging: CLINICAL DATA:  Follow-up examination for brain/CNS neoplasm, monitor.   EXAM: MRI HEAD WITHOUT AND WITH CONTRAST   TECHNIQUE: Multiplanar, multiecho pulse sequences of the brain and surrounding structures were obtained without and with intravenous contrast.   CONTRAST:  10mL GADAVIST  GADOBUTROL  1 MMOL/ML IV SOLN   COMPARISON:  Prior MRI from 12/26/2023   FINDINGS: Brain: Postoperative changes from interval right temporal  craniotomy for tumor resection. Postoperative minimal enhancement about the resection cavity felt to be most consistent with postoperative change slightly improved. Regional mass effect with 6 mm of right-to-left shift, also slightly improved. No hydrocephalus or ventricular trapping. Basilar cisterns remain patent.   No other mass lesion or abnormal enhancement. No evidence for acute or subacute infarct. No other acute or chronic intracranial blood products. Additional scattered nonspecific cerebral white matter changes noted, nonspecific, but most commonly related to chronic microvascular ischemic disease, stable. Pituitary gland within normal limits. No other abnormal enhancement.   Vascular: Major intracranial vascular flow voids are maintained.   Skull and upper cervical spine: Craniocervical junction within normal limits. Bone marrow signal intensity within normal limits. Post craniotomy changes at the right temporal scalp. No other acute finding.   Sinuses/Orbits: Globes orbital soft tissues within normal limits. Paranasal sinuses are clear. No significant mastoid effusion.   Other: None.   IMPRESSION: 1. Postoperative changes from interval right temporal craniotomy for tumor resection. No MRI evidence for residual enhancing tumor or other complication. 2. Persistent T2/FLAIR signal abnormality throughout the adjacent right temporal lobe, consistent with edema and/or nonenhancing infiltrative tumor, slightly improved from prior. Regional mass effect with 6 mm of right-to-left shift, also slightly improved. 3. No other acute intracranial abnormality.     Electronically Signed   By: Morene Hoard M.D.   On: 12/30/2023 02:14   Pathology: SURGICAL PATHOLOGY CASE: [id number] PATIENT: Alexandra Walker Surgical Pathology Report  Clinical History: cranial tumor (cf)  FINAL MICROSCOPIC DIAGNOSIS:  A. BRAIN TUMOR, BIOPSY: High-grade glioma, WHO grade 4  B.  BRAIN TUMOR, RESECTION: High-grade glioma, WHO grade 4  COMMENT:  Sections of both specimens show a highly cellular tumor exhibiting marked vascular proliferation and numerous foci of palisading necrosis and geographic tumor necrosis.  Cytologically the tumor is composed of cells with variably enlarged round to oval irregular hyperchromatic nuclei with scattered inconspicuous nucleoli admixed with occasional multinucleate and frankly pleomorphic cells.  The tumor will be forwarded for IDH testing by PCR.  These results will be issued within an addendum to this report.  Case is reviewed by Dr. Belvie who concurs with the interpretation.  Diagnosis conveyed to Dr. Janjua by Dr. Reed via Epic messaging on 01/01/2024 @ 11:28 AM.   Assessment/Plan Glioblastoma, IDH-wildtype [facility])  We appreciate the opportunity to participate in the care of Alexandra Walker.  She presents with clinical and radiographic syndrome c/w grade 4 glioma, IDH pending.    We had an extensive conversation with her and her family regarding pathology, prognosis, and available treatment pathways.  We are encouraged by her good quality surgery, good functional status.  We ultimately recommended proceeding with course of intensity modulated radiation therapy and concurrent daily Temozolomide.  Radiation will be administered Mon-Fri over 6 weeks, Temodar will be dosed at 75mg /m2 to be given daily over 42 days.  We reviewed side effects of temodar, including fatigue, nausea/vomiting, constipation, and cytopenias.  Informed consent was verbally obtained at bedside to proceed with oral chemotherapy.  Chemotherapy should be held  for the following:  ANC less than 1,000  Platelets less than 100,000  LFT or creatinine greater than 2x ULN  If clinical concerns/contraindications develop  Every 2 weeks during radiation, labs will be checked accompanied by a clinical evaluation in the brain tumor clinic.  We also  discussed and patient consented for additional tumor profiling and sequencing through [facility].  Advanced tumor profiling could help identify actionable mutation for targeted therapy and lead to direct clinical benefit.     Recommended reducing decadron  to 4mg  daily x4 days, then 2mg  daily x4 days, then stopping if tolerated.  Keppra  can be discontinued after the 2 week post-op period.  Screening for potential clinical trials was performed and discussed using eligibility criteria for active protocols at [facility], loco-regional tertiary centers, as well as national database available on GroundTransfer.at.    The patient is not a candidate for a research protocol at this time due to no suitable study identified.   We spent twenty additional minutes teaching regarding the natural history, biology, and historical experience in the treatment of brain tumors. We then discussed in detail the current recommendations for therapy focusing on the mode of administration, mechanism of action, anticipated toxicities, and quality of life issues associated with this plan. We also provided teaching sheets for the patient to take home as an additional resource.  All questions were answered. The patient knows to call the clinic with any problems, questions or concerns. No barriers to learning were detected.  The total time spent in the encounter was 60 minutes and more than 50% was on counseling and review of test results   Arthea MARLA Manns, MD Medical Director of Neuro-Oncology [facility] [place] 01/08/24 2:48 PM

## 2024-01-09 ENCOUNTER — Inpatient Hospital Stay: Admitting: Licensed Clinical Social Worker

## 2024-01-09 ENCOUNTER — Telehealth: Payer: Self-pay | Admitting: Radiation Oncology

## 2024-01-09 ENCOUNTER — Encounter: Admitting: Neurosurgery

## 2024-01-09 ENCOUNTER — Other Ambulatory Visit: Payer: Self-pay

## 2024-01-09 ENCOUNTER — Ambulatory Visit
Admission: RE | Admit: 2024-01-09 | Discharge: 2024-01-09 | Disposition: A | Discharge status: Home / Self Care | Source: Ambulatory Visit | Attending: Radiation Oncology | Admitting: Radiation Oncology

## 2024-01-09 ENCOUNTER — Encounter: Payer: Self-pay | Admitting: Neurosurgery

## 2024-01-09 ENCOUNTER — Encounter: Payer: Self-pay | Admitting: Radiation Oncology

## 2024-01-09 ENCOUNTER — Encounter (HOSPITAL_COMMUNITY): Payer: Self-pay | Admitting: Unknown Physician Specialty

## 2024-01-09 ENCOUNTER — Other Ambulatory Visit (HOSPITAL_BASED_OUTPATIENT_CLINIC_OR_DEPARTMENT_OTHER): Payer: Self-pay

## 2024-01-09 ENCOUNTER — Ambulatory Visit (INDEPENDENT_AMBULATORY_CARE_PROVIDER_SITE_OTHER): Admitting: Neurosurgery

## 2024-01-09 VITALS — BP 123/82 | HR 76 | Ht 66.0 in | Wt 259.0 lb

## 2024-01-09 VITALS — BP 141/72 | HR 87 | Temp 98.1°F | Resp 20 | Ht 66.0 in | Wt 259.4 lb

## 2024-01-09 DIAGNOSIS — C712 Malignant neoplasm of temporal lobe: Secondary | ICD-10-CM

## 2024-01-09 DIAGNOSIS — C719 Malignant neoplasm of brain, unspecified: Secondary | ICD-10-CM

## 2024-01-09 DIAGNOSIS — Z48811 Encounter for surgical aftercare following surgery on the nervous system: Secondary | ICD-10-CM

## 2024-01-09 NOTE — Progress Notes (Signed)
 68 year old lady with a large right posterior temporal brain tumor which was resected about 11 days ago.  Unfortunately this turned out to be malignant brain tumor.  She has already met with the neuro-oncology team and is scheduled to meet with the radiation oncology team soon.  We discussed the diagnosis and I am really happy that she has the team she does.  We talked about the do's and don'ts about incisional care and I would like to see her back in 3 weeks.  She is going to be starting her very radiation and chemo treatments before that and if anything changes, she will let me know.  Today her staples were removed as well.  If anything changes, she will let me know.

## 2024-01-09 NOTE — Telephone Encounter (Signed)
 LVM for [facility] to verify referral received and directed to correct dept. Provided best c/b number

## 2024-01-09 NOTE — Progress Notes (Signed)
 Location/Histology of Brain Tumor:  Grade 4 Right Temporal Glioblastoma  Patient presented with symptoms of:   Vaslow, MD History of difficulty driving/navigating, some disorganization at work and headaches. Mild Headache and lightheadedness   Past or anticipated interventions, if any, per neurosurgery:  12/29/2023 Dr. Rosslyn Right Temporal Craniotomy with Resection  12/29/2023 MRI Brain W WO Contrast    Past or anticipated interventions, if any, per medical oncology:  01/08/2024 Buckley, MD  Dose of Decadron , if applicable:  Take one tablet (4mg ) by mouth daily for 4 days, then 0.5 tablets (2mg ) total daily for 4 days  Recent neurologic symptoms, if any:  Seizures: None Headaches: Intermittent headaches Nausea: None Dizziness/ataxia: Yes, dizziness while walking Difficulty with hand coordination:  None Focal numbness/weakness: None Visual deficits/changes: None Confusion/Memory deficits: None  Painful bone metastases at present, if any: None  SAFETY ISSUES: Prior radiation? None Pacemaker/ICD? None Possible current pregnancy? None Is the patient on methotrexate? None  Additional Complaints / other details: None

## 2024-01-09 NOTE — Telephone Encounter (Signed)
 Paperwork was handed to family member at appointment today to be turned in to her employer.

## 2024-01-11 ENCOUNTER — Encounter: Payer: Self-pay | Admitting: Family Medicine

## 2024-01-12 ENCOUNTER — Encounter: Payer: Self-pay | Admitting: Family Medicine

## 2024-01-12 ENCOUNTER — Ambulatory Visit: Admitting: Radiation Oncology

## 2024-01-12 ENCOUNTER — Ambulatory Visit

## 2024-01-12 NOTE — Progress Notes (Signed)
 [facility] Clinical Social Work  Initial Assessment   Alexandra Walker is a 68 y.o. year old female contacted by phone. Clinical Social Work was referred by medical provider for assessment of psychosocial needs.   SDOH (Social Determinants of Health) assessments performed: Yes SDOH Interventions    Flowsheet Row Office Visit from 11/19/2021 in [facility] [place] [employer] Visit from 10/26/2018 in [facility] [place]  SDOH Interventions    Depression Interventions/Treatment  Counseling Medication    SDOH Screenings   Food Insecurity: No Food Insecurity (01/09/2024)  Housing: Low Risk  (01/09/2024)  Transportation Needs: No Transportation Needs (01/09/2024)  Utilities: Not At Risk (01/09/2024)  Alcohol Screen: Low Risk  (06/13/2023)  Depression (PHQ2-9): Low Risk  (01/09/2024)  Financial Resource Strain: Low Risk  (06/13/2023)  Physical Activity: Insufficiently Active (06/13/2023)  Social Connections: Moderately Isolated (12/27/2023)  Stress: No Stress Concern Present (06/13/2023)  Tobacco Use: Low Risk  (01/09/2024)    PHQ 2/9:    01/09/2024   11:51 AM 01/08/2024    3:30 PM 09/30/2023    8:04 AM  Depression screen PHQ 2/9  Decreased Interest 0 0 0  Down, Depressed, Hopeless 0 0 0  PHQ - 2 Score 0 0 0     Distress Screen completed: No     No data to display            Family/Social Information:  Housing Arrangement: Pt was living alone, but her daughter has moved in with her since diagnosis and will continue to stay with her through treatment.  Family members/support persons in your life? Pt reports she has friends who will be able to assist as well as needed.   Transportation concerns: no  Employment: Working full time as a Immunologist.  Income source: Employment Financial concerns: No Type of concern: None Food access concerns: no Religious or spiritual practice: Yes-[employer] Advanced directives: Yes-scanned to chart Services  Currently in place:  none  Coping/ Adjustment to diagnosis: Patient understands treatment plan and what happens next? yes Concerns about diagnosis and/or treatment: How will I care for myself and Quality of life Patient reported stressors: Anxiety/ nervousness, Adjusting to my illness, and Physical issues Hopes and/or priorities: pt's priority is to start treatment w/ the hope of positive results Patient enjoys time with family/ friends Current coping skills/ strengths: Motivation for treatment/growth , Physical Health , and Supportive family/friends     SUMMARY: Current SDOH Barriers:  No barriers identified at this time  Clinical Social Work Clinical Goal(s):  No clinical social work goals at this time  Interventions: Discussed common feeling and emotions when being diagnosed with cancer, and the importance of support during treatment Informed patient of the support team roles and support services at [facility] Provided CSW contact information and encouraged patient to call with any questions or concerns Pt will come in for additional assessment on 9/2   Follow Up Plan: CSW will see patient on 9/2 Patient verbalizes understanding of plan: Yes    Devere JONELLE Manna, LCSW Clinical Social Worker [facility]

## 2024-01-13 ENCOUNTER — Other Ambulatory Visit (HOSPITAL_BASED_OUTPATIENT_CLINIC_OR_DEPARTMENT_OTHER): Payer: Self-pay

## 2024-01-13 ENCOUNTER — Other Ambulatory Visit: Payer: Self-pay | Admitting: Family Medicine

## 2024-01-13 ENCOUNTER — Encounter: Payer: Self-pay | Admitting: Family Medicine

## 2024-01-13 ENCOUNTER — Telehealth: Payer: Self-pay | Admitting: *Deleted

## 2024-01-13 MED ORDER — BUTALBITAL-APAP-CAFFEINE 50-325-40 MG PO TABS
2.0000 | ORAL_TABLET | ORAL | 0 refills | Status: DC | PRN
  Filled 2024-01-13: qty 30, 3d supply, fill #0

## 2024-01-13 MED ORDER — TRAZODONE HCL 50 MG PO TABS
25.0000 mg | ORAL_TABLET | Freq: Every evening | ORAL | 3 refills | Status: AC | PRN
  Filled 2024-01-13: qty 30, 30d supply, fill #0
  Filled 2024-01-19: qty 30, 30d supply, fill #1

## 2024-01-13 NOTE — Telephone Encounter (Signed)
 Molecular Profiling Requisition, patient demographic sheet, pathology report, office note from 01/08/24 & most recent MRI report faxed to [facility], [phone].  Fax confirmation received.

## 2024-01-13 NOTE — Telephone Encounter (Signed)
 09/30/2023 LOV  12/31/2023 fill date   14/0 refills

## 2024-01-14 ENCOUNTER — Other Ambulatory Visit (HOSPITAL_BASED_OUTPATIENT_CLINIC_OR_DEPARTMENT_OTHER): Payer: Self-pay

## 2024-01-14 ENCOUNTER — Encounter: Payer: Self-pay | Admitting: Neurology

## 2024-01-15 ENCOUNTER — Ambulatory Visit (INDEPENDENT_AMBULATORY_CARE_PROVIDER_SITE_OTHER): Admitting: Neurology

## 2024-01-15 ENCOUNTER — Inpatient Hospital Stay: Admitting: Licensed Clinical Social Worker

## 2024-01-15 ENCOUNTER — Encounter: Payer: Self-pay | Admitting: *Deleted

## 2024-01-15 ENCOUNTER — Ambulatory Visit: Attending: Radiation Oncology | Admitting: Occupational Therapy

## 2024-01-15 ENCOUNTER — Other Ambulatory Visit: Payer: Self-pay

## 2024-01-15 DIAGNOSIS — G4726 Circadian rhythm sleep disorder, shift work type: Secondary | ICD-10-CM

## 2024-01-15 DIAGNOSIS — C719 Malignant neoplasm of brain, unspecified: Secondary | ICD-10-CM

## 2024-01-15 DIAGNOSIS — C712 Malignant neoplasm of temporal lobe: Secondary | ICD-10-CM | POA: Insufficient documentation

## 2024-01-15 DIAGNOSIS — M6281 Muscle weakness (generalized): Secondary | ICD-10-CM | POA: Insufficient documentation

## 2024-01-15 DIAGNOSIS — R2681 Unsteadiness on feet: Secondary | ICD-10-CM | POA: Insufficient documentation

## 2024-01-15 DIAGNOSIS — G4733 Obstructive sleep apnea (adult) (pediatric): Secondary | ICD-10-CM | POA: Diagnosis not present

## 2024-01-15 NOTE — Progress Notes (Signed)
 [facility] CSW Progress Note  Clinical Child psychotherapist contacted patient by phone to follow-up on need for community resources.    Interventions: Pt requesting assistance w/ finding resources to obtain a wig.  Pt informed of availability of wigs at [facility] as well as through [employer].  Pt provided w/ the contact number for Cancer Services and a message was sent to [facility] to set up a time to come in by CSW.      Follow Up Plan:  Patient will contact CSW with any support or resource needs    Devere JONELLE Manna, LCSW Clinical Social Worker [facility]

## 2024-01-15 NOTE — Progress Notes (Signed)
 Temodar ordered Radiation oncology start date 01/26/24 Radiation oncology end date 03/05/24  Requested lab & MD visits with Dr Buckley around weeks 2, 4, and 6.  Scheduling request sent on 01/15/24.

## 2024-01-15 NOTE — Therapy (Signed)
 OUTPATIENT OCCUPATIONAL THERAPY NEURO EVALUATION  Patient Name: Alexandra Walker MRN: [id number] DOB:10/26/1955, 68 y.o., female Today's Date: 01/15/2024  PCP: Jodie Lavern LITTIE, MD REFERRING PROVIDER: Izell Domino, MD  END OF SESSION:  OT End of Session - 01/15/24 1312     Visit Number 1    Number of Visits 7    Date for OT Re-Evaluation 03/12/24    Authorization Type [employer] [id number]    OT Start Time 1150    OT Stop Time 1243    OT Time Calculation (min) 53 min          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   Diabetes (HCC)    Diverticulosis    Minimal   Fatty liver    GERD (gastroesophageal reflux disease)    Heart murmur    Hepatic steatosis    History of hiatal hernia 2017   Small   HLA B27 (HLA B27 positive)    Hx of pancreatitis    Hyperlipidemia    Hypertension    IBS (irritable bowel syndrome)    Ischemic colitis (HCC)    Lactose intolerance    Lumbar scoliosis 2010   Mild   Migraine headache without aura 05/10/2015   Multiple thyroid  nodules    Nephrolithiasis 2014   Left, minimal   Obesity due to excess calories with serious comorbidity 05/24/2019   Palpitations    PONV (postoperative nausea and vomiting)    Prediabetes    Presbyopia    Right bundle branch block    Sepsis (HCC) 01/2016   Sleep apnea    Sleep hypopnea    Vitamin D  deficiency    Past Surgical History:  Procedure Laterality Date   APPLICATION OF CRANIAL NAVIGATION N/A 12/29/2023   Procedure: COMPUTER-ASSISTED NAVIGATION, FOR CRANIAL PROCEDURE;  Surgeon: Rosslyn Dino HERO, MD;  Location:  [facility] OR;  Service: Neurosurgery;  Laterality: N/A;   BREAST BIOPSY Left    CERVICAL CONE BIOPSY     CHOLECYSTECTOMY N/A 10/26/2014   Procedure: LAPAROSCOPIC CHOLECYSTECTOMY;  Surgeon: Vicenta Poli, MD;  Location: [facility];  Service: General;  Laterality: N/A;   COLONOSCOPY W/ BIOPSIES  multiple   CRANIOTOMY Right 12/29/2023   Procedure: CRANIOTOMY TUMOR EXCISION;  Surgeon: Rosslyn Dino HERO, MD;  Location: [facility] OR;  Service: Neurosurgery;  Laterality: Right;   CYSTOSCOPY WITH RETROGRADE PYELOGRAM, URETEROSCOPY AND STENT PLACEMENT Left 06/26/2018   Procedure: CYSTOSCOPY WITH RETROGRADE PYELOGRAM, URETEROSCOPY AND STENT PLACEMENT;  Surgeon: Alvaro Hummer, MD;  Location: [facility];  Service: Urology;  Laterality: Left;   DIAGNOSTIC LAPAROSCOPY     DILATION AND CURETTAGE OF UTERUS  11/2009   DUPUYTREN CONTRACTURE RELEASE Left    x 3 , Dr Camella   ESOPHAGOGASTRODUODENOSCOPY  multiple   EYE SURGERY     HOLMIUM LASER APPLICATION Left 06/26/2018   Procedure: HOLMIUM LASER APPLICATION;  Surgeon: Alvaro Hummer, MD;  Location: [facility];  Service: Urology;  Laterality: Left;   KNEE ARTHROSCOPY Left 11/2008   LAPAROSCOPIC VAGINAL HYSTERECTOMY WITH SALPINGO OOPHORECTOMY Bilateral  10/03/2015   Procedure: LAPAROSCOPIC ASSISTED VAGINAL HYSTERECTOMY WITH SALPINGECTOMY;  Surgeon: Shanda SHAUNNA Muscat, MD;  Location: [facility];  Service: Gynecology;  Laterality: Bilateral;   LASIK  1990   RK   LEFT FINGER SURGERY     LIVER BIOPSY     MASS EXCISION Left 12/24/2012   Procedure: LEFT EXCISION OF PALMAR MASS X TWO;  Surgeon: Elsie Mussel, MD;  Location: [facility];  Service: Orthopedics;  Laterality: Left;   PHOTOREFRACTIVE KERATOTOMY  1994   RADIAL KERATOTOMY  1994   TRANSOBTURATOR SLING     Patient Active Problem List   Diagnosis Date Noted   Cancer of temporal lobe (HCC) 01/09/2024   Glioblastoma, IDH-wildtype (HCC) 12/29/2023    Intracranial mass 12/26/2023   Multiple thyroid  nodules 11/19/2023   Thyroid  nodule 10/07/2023   Osteopenia after menopause 06/13/2023   Sprain of interphalangeal joint of right little finger 06/03/2023   Shifting sleep-work schedule, affecting sleep 10/07/2022   Sleep related bruxism 10/07/2022   Esophageal stricture 09/02/2022   Bilateral lower extremity edema 09/02/2022   Controlled type 2 diabetes mellitus without complication, without long-term current use of insulin  (HCC) 05/27/2022   Morbid obesity (HCC) 08/07/2020   Bilateral hip bursitis 05/06/2019   Pain of right hip joint 03/04/2019   Diverticulosis of colon 05/16/2018   Drug-induced pancreatitis, 2015, thought to be due to [facility] 03/29/2018   Nephrolithiasis 03/29/2018   History of vitreous detachment OU, HTN retinopathy OU, High myopia OU, Cataracts OU, Vitreous floaters OU, Keratoconjunctivitis sicca OU not specified as Sjogren's 03/29/2018   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HCC)    Attention deficit hyperactivity disorder (ADHD), combined type 10/19/2017   Recurrent circadian rhythm sleep disorder, shift work type 03/31/2017   IBS (irritable bowel syndrome), diarrhea predominant 03/17/2017   NAFLD (nonalcoholic fatty liver disease) 94/89/7981   OSA on CPAP, followed by Neurology/Sleep 09/26/2016   History of sepsis 02/01/2016   Hypertension associated with diabetes (HCC) 01/31/2016   Combined hyperlipidemia associated with type 2 diabetes mellitus (HCC) 05/10/2015   Vitamin D  deficiency, on Vit D 2000 IU daily 05/10/2015   Paroxysmal SVT (supraventricular tachycardia) (HCC) 09/19/2011    ONSET DATE: referral 01/09/24  REFERRING DIAG: C71.2 (ICD-10-CM) - Glioblastoma of temporal lobe  THERAPY DIAG:  Muscle weakness (generalized)  Unsteadiness on feet  Rationale for Evaluation and Treatment: Rehabilitation  SUBJECTIVE:   SUBJECTIVE STATEMENT: Pt reports things are going quite well right now.  Pt reports that she  always being scattered in thinking due to having ADD, and nature of working in ED.  But now reports that she has had instances where she couldn't find a word or couldn't do things in her typical fashion, and was even having increased difficulties with sequencing when driving.  Pt reports that her thinking in work and even texting would be jumbled.  Pt reports that she was at work when her supervisor recommended that she be checked out at ED.  MRI revealed 4 cm tumor in temporal lobe.  Diagnosis on 12/26/23, surgery on 12/29/23.  Pt went home Weds, reporting mild swimmy headed when turning quickly and mild unsteadiness with ambulation.  Pt states that she utilizes proprioceptive input when walking - does have canes from her mother that she wants to use.   Pt accompanied by: self and daughter remained in waiting room, until end of session   PERTINENT HISTORY: 12/26/23 underwent an MRI of the brain with contrast which redemonstrated: the posterior right temporal lobe mass highly suspicious for high-grade  primary glial neoplasm with avid peripheral enhancement and central necrosis measuring approximately 4.5 x 3.9 cm, with associated dural enhancement extending anteriorly along the right temporal convexity. underwent a right craniotomy for resection of the posterior right temporal lobe mass on 12/29/23 under the care of Dr. Rosslyn. 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.  She reports that she is hyperverbal due to recent steroids.  She has intermittent headaches.    PRECAUTIONS: Other: limit screen time for 2-3 weeks  WEIGHT BEARING RESTRICTIONS: No  PAIN:  Are you having pain? No  FALLS: Has patient fallen in last 6 months? 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  HAND DOMINANCE: Right  ADLs: Overall ADLs: Independent, even showering without supervision Transfers/ambulation related to ADLs: No AD  IADLs: Light housekeeping: Independent Meal Prep: Independent Community mobility: not cleared to drive, on Keppra  Medication management: daughter assisting, using pill box Financial management: has resumed, daughter assisted initially post hospitalization   MOBILITY STATUS: walks somewhat guarded and uses intermittent support on furniture or wall  POSTURE COMMENTS:  No Significant postural limitations  ACTIVITY TOLERANCE: Activity tolerance: diminished  FUNCTIONAL OUTCOME MEASURES: TBD  UPPER EXTREMITY ROM:    Active ROM Right eval Left eval  Shoulder flexion [facility]  Shoulder abduction [facility]  Shoulder adduction    Shoulder extension    Shoulder internal rotation [facility] WFL  Shoulder external rotation [facility] WFL  Elbow flexion WFL WFL  Elbow extension [facility] WFL  Wrist flexion    Wrist extension    Wrist ulnar deviation    Wrist radial deviation    Wrist pronation    Wrist supination    (Blank rows = not tested)  UPPER EXTREMITY MMT:     MMT Right eval Left eval  Shoulder flexion 4- 4  Shoulder abduction 4- 4-  Shoulder adduction    Shoulder extension    Shoulder internal rotation    Shoulder external rotation    Middle trapezius    Lower trapezius    Elbow flexion 4 4  Elbow extension 4 4  Wrist flexion    Wrist extension    Wrist ulnar deviation    Wrist radial deviation    Wrist pronation    Wrist supination    (Blank rows = not  tested)  HAND FUNCTION: Grip strength: Right: 59 lbs; Left: 60 lbs  COORDINATION: 9 Hole Peg test: Right: 26.0 sec; Left: 27.0 sec  SENSATION: WFL  EDEMA: h/o chronic lower extremity edema  COGNITION: Overall cognitive status: Within functional limits for tasks assessed; somewhat hyperverbal but pt attributes to ADD and due to recent steroids.   VISION: Subjective report: blurry on L due to cornea issues Baseline vision: No visual deficits  VISION ASSESSMENT: WFL  OBSERVATIONS: Pleasant pt who is well knowledgeable about her condition as well as potential for deficits to increase with chemo and radiation.  Pt currently Independent with all ADLs and  IADLs with exception of daughter driving and administering medications.  Pt hyperverbal, however attributes to ADD and recent steroids.  Daughter supportive and knowledgeable about importance of continued mobility as well as what to look for as treatment begins.                                                                                                                             TREATMENT DATE:  01/15/24  Engaged in discussion about importance of continued engagement in ADLs and IADLs as activity tolerance permits.  Educated briefly on energy conservation strategies to utilize to allow for engagement in desired ADLs and IADLs as endurance wanes with beginning of chemo and radiation.  OT encouraged pt's daughter and pt to reflect on activity tolerance and if pt requiring increased rest breaks and/or increased touch points when standing or ambulating.   Medication management: OT encouraged pt's daughter to still manage pt's medications due to importance of accuracy with chemo meds but to include pt to ensure that she is aware of what she is taking and when. Encouraged pt to engage in core strengthening with supine bridging with sustained hold for 10-15 seconds as tolerated.  Plan to further address core strengthening and endurance  activities.    PATIENT EDUCATION: Education details: Educated on role and purpose of OT as well as potential interventions and goals for therapy based on initial evaluation findings. Person educated: Patient and Child(ren) Education method: Medical illustrator Education comprehension: verbalized understanding and needs further education  HOME EXERCISE PROGRAM: TBD   GOALS: Goals reviewed with patient? Yes  SHORT TERM GOALS: Target date: 02/13/24  Pt will be independent in core and full body strengthening HEP. Baseline: to be initiated Goal status: INITIAL  2.  Pt will verbalize understanding of energy conservation strategies and when to utilize during ADLs and IADLs. Baseline: decreased endurance Goal status: INITIAL  3.  Pt will verbalize understanding of importance of good sleep hygiene/routine. Baseline:  Goal status: INITIAL   LONG TERM GOALS: Target date: 03/12/24  Pt will verbalize understanding of task modifications and/or potential A/E needs to increase ease, safety, and independence w/ ADLs and IADLs Baseline: TBD Goal status: INITIAL  2.  Pt will report ability to engage in 30 mins moderately strenuous activity with 1 or fewer rest breaks (vacuuming, cleaning bathroom, etc) Baseline: decreased endurance  Goal status: INITIAL  3.  Pt wil maintain BUE strength and coordination as needed to engage in ADLs and IADLs. Baseline: 9 Hole Peg test: Right: 26.0 sec; Left: 27.0 sec; Grip strength: R: 59# and L: 60# Goal status: INITIAL  4.  Pt will demonstrate improved BUE strength to 4/5 overall Baseline: 4- Goal status: INITIAL   ASSESSMENT:  CLINICAL IMPRESSION: Patient is a 68 y.o. female who was seen today for occupational therapy evaluation for impairments due to glioblastoma of temporal lobe.  Pt reports having tumor resection and is scheduled to begin chemo and radiation to begin  week of 01/26/24.  Pt currently with mild decreased balance and  endurance as well as decreased UB strength, largely due to change in activity level since tumor resection.  Pt and daughter report pt Mod I to Independent with all ADLs and daughter is providing assistance for driving and medications.  Pt to be followed by occupational therapy to focus on endurance, strengthening, and energy conservation while also being available if pt to develop additional weaknesses during chemo and radiation treatment.    PERFORMANCE DEFICITS: in functional skills including ADLs, IADLs, strength, balance, body mechanics, endurance, decreased knowledge of use of DME, and UE functional use and psychosocial skills including environmental adaptation and routines and behaviors.   IMPAIRMENTS: are limiting patient from ADLs, IADLs, rest and sleep, and work.   CO-MORBIDITIES: may have co-morbidities  that affects occupational performance. Patient will benefit from skilled OT to address above impairments and improve overall function.  MODIFICATION OR ASSISTANCE TO COMPLETE EVALUATION: No modification of tasks or assist necessary to complete an evaluation.  OT OCCUPATIONAL PROFILE AND HISTORY: Detailed assessment: Review of records and additional review of physical, cognitive, psychosocial history related to current functional performance.  CLINICAL DECISION MAKING: Moderate - several treatment options, min-mod task modification necessary  REHAB POTENTIAL: Good  EVALUATION COMPLEXITY: Moderate    PLAN:  OT FREQUENCY: 1x/week  OT DURATION: 8 weeks  PLANNED INTERVENTIONS: 97168 OT Re-evaluation, 97535 self care/ADL training, 02889 therapeutic exercise, 97530 therapeutic activity, 97112 neuromuscular re-education, functional mobility training, psychosocial skills training, energy conservation, coping strategies training, patient/family education, and DME and/or AE instructions  RECOMMENDED OTHER SERVICES: PT  CONSULTED AND AGREED WITH PLAN OF CARE: Patient and family  member/caregiver  PLAN FOR NEXT SESSION: educate on energy conservation strategies (provide handout), initiate core and UB strengthening exercises, assess coordination and strength as needed   KAYLENE DOMINO, OTR/L 01/15/2024, 1:14 PM  [facility] at [facility] [address], [address] [place], [place] [address] Phone # [phone] Fax # [phone]

## 2024-01-16 ENCOUNTER — Ambulatory Visit: Admitting: Family Medicine

## 2024-01-16 ENCOUNTER — Encounter: Payer: Self-pay | Admitting: Family Medicine

## 2024-01-16 VITALS — BP 116/75 | HR 62 | Temp 98.4°F | Ht 66.0 in | Wt 257.2 lb

## 2024-01-16 DIAGNOSIS — E1159 Type 2 diabetes mellitus with other circulatory complications: Secondary | ICD-10-CM

## 2024-01-16 DIAGNOSIS — E1165 Type 2 diabetes mellitus with hyperglycemia: Secondary | ICD-10-CM | POA: Diagnosis not present

## 2024-01-16 DIAGNOSIS — C719 Malignant neoplasm of brain, unspecified: Secondary | ICD-10-CM | POA: Diagnosis not present

## 2024-01-16 DIAGNOSIS — I152 Hypertension secondary to endocrine disorders: Secondary | ICD-10-CM

## 2024-01-16 DIAGNOSIS — I471 Supraventricular tachycardia, unspecified: Secondary | ICD-10-CM

## 2024-01-19 ENCOUNTER — Other Ambulatory Visit: Payer: Self-pay | Admitting: Family Medicine

## 2024-01-19 ENCOUNTER — Other Ambulatory Visit (HOSPITAL_BASED_OUTPATIENT_CLINIC_OR_DEPARTMENT_OTHER): Payer: Self-pay | Admitting: Obstetrics & Gynecology

## 2024-01-19 ENCOUNTER — Encounter: Payer: Self-pay | Admitting: Radiation Oncology

## 2024-01-19 ENCOUNTER — Other Ambulatory Visit (HOSPITAL_BASED_OUTPATIENT_CLINIC_OR_DEPARTMENT_OTHER): Payer: Self-pay

## 2024-01-19 DIAGNOSIS — N3941 Urge incontinence: Secondary | ICD-10-CM

## 2024-01-19 DIAGNOSIS — L292 Pruritus vulvae: Secondary | ICD-10-CM

## 2024-01-19 NOTE — Progress Notes (Signed)
 Subjective  CC:  Chief Complaint  Patient presents with   Hospitalization Follow-up    HPI: Alexandra Walker is a 68 y.o. female who presents to the office today to address the problems listed above in the chief complaint. Discussed the use of AI scribe software for clinical note transcription with the patient, who gave verbal consent to proceed.  History of Present Illness Alexandra Walker is a 68 year old female with glioblastoma hospitalized aug 8-10 who presents for follow-up after brain surgery. She is accompanied by her daughter, Maggie.  She underwent brain surgery on December 29, 2023, for the removal of a glioblastoma in the occipital temporal region. Prior to surgery, she experienced confusion, dizziness, lightheadedness, and shakiness, but no vision loss, speech difficulties, or weakness. Post-surgery, she has been undergoing physical, occupational, and respiratory therapy. She experiences fatigue, poor core strength, and deconditioning but no headaches in the past week. She is currently on Keppra  and stopped Medrol  three weeks ago, which had caused increased appetite and weight gain.  Her initial symptoms began around December 02, 2023, with an emotional episode of uncontrollable crying, followed by intermittent tension headaches, lightheadedness, and 'ice pick headaches' that responded to ibuprofen . An MRA showed open vascular structures, and she was initially treated for muscle tension. Her condition escalated, leading to an ER visit where an MRI revealed the tumor.  Her past medical history includes diabetes and hypertension. She has been off Mounjaro  and is considering resuming metformin  depending on her blood sugar levels, which have been fasting between 137 to 142 mg/dL. She is also considering resuming metoprolol  for blood pressure management, which has been around 140/78 mmHg. Bb is helpful for psvt and symptomatic palpitations. Diabetic control is good an diet has changed  considerably.   Socially, she has a strong support system, including her daughter and family, who have helped reorganize her home and lifestyle.   Assessment  1. Glioblastoma, IDH-wildtype (HCC)   2. Type 2 diabetes mellitus with hyperglycemia, without long-term current use of insulin  (HCC)   3. Paroxysmal SVT (supraventricular tachycardia) (HCC)   4. Hypertension associated with diabetes (HCC)   5. 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 Post-surgical resection was complete. Undergoing 30 cycles of radiation with Temodar . Aware of side effects and risks. Engaged in therapies and exercise to manage symptoms.  - Continue 30 cycles of radiation with Temodar  chemotherapy. - Monitor for side effects such as fatigue, nausea, and hair loss. - Engage in occupational therapy, physical therapy, and respiratory therapy. - Participate in cancer fit exercise program. -supportive  counseling given  Type 2 diabetes mellitus, monitoring post-steroid Blood glucose monitored post-steroid. Fasting levels 137-142 mg/dL. Stopped Mounjaro  due to adverse effects. Considering metformin  restart post-steroid. Advised to report if fasting exceeds 150 mg/dL. - Monitor fasting blood glucose levels. - Consider restarting metformin  if fasting glucose levels exceed 150 mg/dL. - Reassess diabetes management in 6 to 12 weeks.  Hypertension, monitoring and considering metoprolol  restart Blood pressure monitored. Previously on metoprolol , paused during hospitalization. Current readings 140-135/78-82 mmHg. Considering metoprolol  restart. Aspirin  not recommended due to brain bleed risk. - Monitor blood pressure. - restart metoprolol  25mg xl daily  Fatigue and deconditioning secondary to cancer and treatment Fatigue and deconditioning due to cancer  and treatment. Engaged in therapies and exercise to improve strength and endurance. - Continue occupational  therapy, physical therapy, and respiratory therapy. - Participate in cancer fit exercise program.    Follow up: 3 mo No orders of the defined types were placed in this encounter.  No orders of the defined types were placed in this encounter.    I reviewed the patients updated PMH, FH, and SocHx.  Patient Active Problem List   Diagnosis Date Noted   Thyroid  nodule 10/07/2023    Priority: High   Controlled type 2 diabetes mellitus without complication, without long-term current use of insulin  (HCC) 05/27/2022    Priority: High   Morbid obesity (HCC) 08/07/2020    Priority: High   Attention deficit hyperactivity disorder (ADHD), combined type 10/19/2017    Priority: High   OSA on CPAP, followed by Neurology/Sleep 09/26/2016    Priority: High   Hypertension associated with diabetes (HCC) 01/31/2016    Priority: High   Combined hyperlipidemia associated with type 2 diabetes mellitus (HCC) 05/10/2015    Priority: High   Paroxysmal SVT (supraventricular tachycardia) (HCC) 09/19/2011    Priority: High   Osteopenia after menopause 06/13/2023    Priority: Medium    Esophageal stricture 09/02/2022    Priority: Medium    Bilateral hip bursitis 05/06/2019    Priority: Medium    Nephrolithiasis 03/29/2018    Priority: Medium    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HCC)     Priority: Medium    Recurrent circadian rhythm sleep disorder, shift work type 03/31/2017    Priority: Medium    IBS (irritable bowel syndrome), diarrhea predominant 03/17/2017    Priority: Medium    NAFLD (nonalcoholic fatty liver disease) 94/89/7981    Priority: Medium    Bilateral lower extremity edema 09/02/2022    Priority: Low   Diverticulosis of colon 05/16/2018    Priority: Low   Drug-induced pancreatitis, 2015, thought to be due to [place] 03/29/2018    Priority: Low   History of vitreous detachment OU,  HTN retinopathy OU, High myopia OU, Cataracts OU, Vitreous floaters OU, Keratoconjunctivitis sicca OU not specified as Sjogren's 03/29/2018    Priority: Low   History of sepsis 02/01/2016    Priority: Low   Vitamin D  deficiency, on Vit D 2000 IU daily 05/10/2015    Priority: Low   Cancer of temporal lobe (HCC) 01/09/2024   Glioblastoma, IDH-wildtype (HCC) 12/29/2023   Intracranial mass 12/26/2023   Multiple thyroid  nodules 11/19/2023   Sprain of interphalangeal joint of right little finger 06/03/2023   Shifting sleep-work schedule, affecting sleep 10/07/2022   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   baclofen  (LIORESAL ) 10 MG tablet Take 1 tablet (10 mg total) by mouth 2 (two) times daily.   butalbital -acetaminophen -caffeine  (FIORICET ) 50-325-40 MG tablet Take 2 tablets by mouth every 4 (four) hours as needed for headache (moderate headache).   cholecalciferol  (VITAMIN D ) 1000 UNITS tablet Take 2,000 Units by mouth daily.    cyanocobalamin  (VITAMIN B12) 1000 MCG tablet Take 1,000 mcg by mouth daily.   [EXPIRED] dexamethasone  (DECADRON ) 4 MG tablet Take 1 tablet (4 mg total) by mouth daily for 4 days, THEN 0.5 tablets (2 mg total) daily for 4 days.   estradiol  (ESTRACE ) 0.1 MG/GM vaginal cream Place 1 gram vaginally 2 (two) times a week.   fluticasone  (FLONASE ) 50 MCG/ACT nasal spray Place 1 spray into both nostrils in the morning and at bedtime.   furosemide  (  LASIX ) 40 MG tablet Take 1 tablet (40 mg total) by mouth daily.   levETIRAcetam  (KEPPRA ) 500 MG tablet Take 1 tablet (500 mg total) by mouth 2 (two) times daily.   losartan  (COZAAR ) 100 MG tablet Take 1 tablet (100 mg total) by mouth daily.   Lutein 6 MG CAPS Take 6 mg by mouth daily.   [Paused] metoprolol  succinate (TOPROL -XL) 100 MG 24 hr tablet Take 1 tablet (100 mg total) by mouth daily with or immediately following  a meal.   mometasone  (ELOCON ) 0.1 % cream Apply 1 application topically 2 (two) times a week.   Multiple Vitamin (MULTIVITAMIN WITH MINERALS) TABS tablet Take 1 tablet by mouth daily.   potassium chloride  SA (KLOR-CON  M) 20 MEQ tablet Take 1 tablet (20 mEq total) by mouth daily.   rosuvastatin  (CRESTOR ) 20 MG tablet Take 1 tablet (20 mg total) by mouth daily.   traZODone  (DESYREL ) 50 MG tablet Take 0.5-1 tablets (25-50 mg total) by mouth at bedtime as needed for sleep.   Allergies: Patient is allergic to ace inhibitors, dust mite extract, oxycodone  hcl, oxycodone -acetaminophen , tree extract, vicodin [hydrocodone-acetaminophen ], victoza [liraglutide], and adhesive [tape]. Family History: Patient family history includes Coronary artery disease in her mother; Depression in her mother; Diabetes in her mother; Heart attack in her father and mother; Heart disease in her father and mother; Hyperlipidemia in her mother; Hypertension in her brother, mother, and sister; Kidney disease in her mother; Obesity in her mother; Sleep apnea in her mother; Sudden death in her father. Social History:  Patient  reports that she has never smoked. She has never used smokeless tobacco. She reports current alcohol use. She reports that she does not use drugs.  Review of Systems: Constitutional: Negative for fever malaise or anorexia Cardiovascular: negative for chest pain Respiratory: negative for SOB or persistent cough Gastrointestinal: negative for abdominal pain  Objective  Vitals: BP 116/75   Pulse 62   Temp 98.4 F (36.9 C)   Ht 5' 6 (1.676 m)   Wt 257 lb 3.2 oz (116.7 kg)   LMP 05/20/2008   SpO2 98%   BMI 41.51 kg/m  General: no acute distress , A&Ox3 HEENT: PEERL, conjunctiva normal, neck is supple Cardiovascular:  RRR without murmur or gallop.  Respiratory:  Good breath sounds bilaterally, CTAB with normal respiratory effort Skin:  Warm, no rashes Commons side effects, risks, benefits, and  alternatives for medications and treatment plan prescribed today were discussed, and the patient expressed understanding of the given instructions. Patient is instructed to call or message via MyChart if he/she has any questions or concerns regarding our treatment plan. No barriers to understanding were identified. We discussed Red Flag symptoms and signs in detail. Patient expressed understanding regarding what to do in case of urgent or emergency type symptoms.  Medication list was reconciled, printed and provided to the patient in AVS. Patient instructions and summary information was reviewed with the patient as documented in the AVS. This note was prepared with assistance of Dragon voice recognition software. Occasional wrong-word or sound-a-like substitutions may have occurred due to the inherent limitations of voice recognition software

## 2024-01-20 ENCOUNTER — Other Ambulatory Visit: Payer: Self-pay

## 2024-01-20 ENCOUNTER — Other Ambulatory Visit (HOSPITAL_COMMUNITY): Payer: Self-pay

## 2024-01-20 ENCOUNTER — Inpatient Hospital Stay: Attending: Internal Medicine | Admitting: Licensed Clinical Social Worker

## 2024-01-20 ENCOUNTER — Telehealth: Payer: Self-pay

## 2024-01-20 ENCOUNTER — Ambulatory Visit
Admission: RE | Admit: 2024-01-20 | Discharge: 2024-01-20 | Disposition: A | Discharge status: Home / Self Care | Source: Ambulatory Visit | Attending: Radiation Oncology | Admitting: Radiation Oncology

## 2024-01-20 ENCOUNTER — Telehealth: Payer: Self-pay | Admitting: Pharmacist

## 2024-01-20 ENCOUNTER — Other Ambulatory Visit: Payer: Self-pay | Admitting: Internal Medicine

## 2024-01-20 ENCOUNTER — Other Ambulatory Visit (HOSPITAL_BASED_OUTPATIENT_CLINIC_OR_DEPARTMENT_OTHER): Payer: Self-pay

## 2024-01-20 DIAGNOSIS — C712 Malignant neoplasm of temporal lobe: Secondary | ICD-10-CM | POA: Diagnosis not present

## 2024-01-20 DIAGNOSIS — Z510 Encounter for antineoplastic radiation therapy: Secondary | ICD-10-CM | POA: Insufficient documentation

## 2024-01-20 DIAGNOSIS — C719 Malignant neoplasm of brain, unspecified: Secondary | ICD-10-CM

## 2024-01-20 LAB — SURGICAL PATHOLOGY

## 2024-01-20 MED ORDER — BUTALBITAL-APAP-CAFFEINE 50-325-40 MG PO TABS
2.0000 | ORAL_TABLET | ORAL | 0 refills | Status: AC | PRN
  Filled 2024-01-20: qty 60, 5d supply, fill #0

## 2024-01-20 MED ORDER — TEMOZOLOMIDE 180 MG PO CAPS
180.0000 mg | ORAL_CAPSULE | Freq: Every day | ORAL | 0 refills | Status: DC
  Filled 2024-01-20 – 2024-01-21 (×2): qty 28, 28d supply, fill #0
  Filled 2024-02-10: qty 14, 14d supply, fill #1

## 2024-01-20 MED ORDER — ONDANSETRON HCL 8 MG PO TABS
8.0000 mg | ORAL_TABLET | Freq: Three times a day (TID) | ORAL | 1 refills | Status: AC | PRN
  Filled 2024-01-20 – 2024-01-21 (×2): qty 30, 10d supply, fill #0

## 2024-01-20 NOTE — Telephone Encounter (Signed)
 [facility]       Telephone: [phone]?Fax: [phone]   Oncology Clinical Pharmacist Practitioner Initial Assessment  Received new prescription for temozolomide  for the treatment of glioblastoma in conjunction with radiation, planned duration 42 days.  Labs from 12/30/23 assessed, will also have labs every two weeks while on therapy per Dr. Buckley. Prescription dose and frequency assessed.   Current medication list in Epic reviewed, no signifant DDIs with temozolomide  identified:  Evaluated chart and no patient barriers to medication adherence identified.   Patient agreement for treatment documented in MD note on 01/20/24.  Prescription has been e-scribed to the [facility]) for benefits analysis and approval.  Oral Oncology Clinic will continue to follow for insurance authorization, copayment issues, initial counseling and start date.  Shantera Monts A. Lucila, PharmD, BCOP, CPP Oral Oncology Clinic 01/20/2024 11:31 AM  **Disclaimer: This note was dictated with voice recognition software. Similar sounding words can inadvertently be transcribed and this note may contain transcription errors which may not have been corrected upon publication of note.**

## 2024-01-20 NOTE — Telephone Encounter (Signed)
 01/16/2024 lov  01/13/2024 fill date  30/0 refills

## 2024-01-20 NOTE — Progress Notes (Signed)
 Patient counseled in clinic telephone visit note on 01/20/24. Patient's caregiver, Maggie (dtr) took the call and will be providing the medication to her.

## 2024-01-20 NOTE — Progress Notes (Signed)
 START ON PATHWAY REGIMEN - Neuro     One cycle, concurrent with RT:     Temozolomide   **Always confirm dose/schedule in your pharmacy ordering system**  Patient Characteristics: Glioma, Glioblastoma, IDH-wildtype, Newly Diagnosed or Treatment Naive, Good Performance Status and/or Younger Patient, MGMT Promoter Unmethylated/Unknown Disease Classification: Glioma Disease Classification: Glioblastoma, IDH-wildtype Disease Status: Newly Diagnosed or Treatment Naive Performance Status: Good Performance Status and/or Younger Patient MGMT Promoter Methylation Status: Awaiting Test Results Intent of Therapy: Non-Curative / Palliative Intent, Discussed with Patient

## 2024-01-20 NOTE — Telephone Encounter (Signed)
 Oral Oncology Patient Advocate Encounter   Received notification that prior authorization for Temozolomide  is required.   PA submitted on 01/20/24 Key [id number] Status is pending     [name], CPhT [place]  [facility] Specialty Pharmacy Services Pharmacy Technician Patient Advocate Specialist II [facility] [place] Phone: [phone]  Fax: [phone] [email or web address]

## 2024-01-20 NOTE — Telephone Encounter (Signed)
 Oral Chemotherapy Pharmacist Encounter  I spoke with patient for overview of: Temodar  for the treatment of glioblastoma multiforme in conjunction with radiation, planned duration concomitant phase 42 days of therapy.   Treatment goal: Control  Counseled patient on administration, dosing, side effects, monitoring, drug-food interactions, safe handling, storage, and disposal.  Patient will take Temodar  180 mg capsules, 180 mg total daily dose, by mouth once daily, may take at bedtime and on an empty stomach to decrease nausea and vomiting.  Patient will take Temodar  concurrent with radiation for 42 days straight.  Temodar  and radiation start date: Tentative 01/26/24  Adverse effects include but are not limited to: nausea, vomiting, GI upset, rash, and fatigue.  Nausea/Vomiting PPX: Prophylactic Zofran  will not be used at initiation of concurrent phase, but will be initiated if nausea develops despite Temodar  administration on an empty stomach and at bedtime. If this occurs, patient will take Zofran  8 mg tablet, 1 tablet by mouth 30-60 min prior to Temodar  dose to help decrease N/V   Reviewed with patient importance of keeping a medication schedule and plan for any missed doses. No barriers to medication adherence identified.  Medication reconciliation performed and medication/allergy list updated.  Distress thermometer was done earlier today.  Labs are ordered but not scheduled yet. Dr. Buckley and nursing team made aware.  Communication and Learning Assessment Primary learner: Self Barriers to learning: No barriers Preferred language: English Learning preferences: Listening  All questions answered.  Ms. Linders voiced understanding and appreciation.   Medication education handout given to patient. Patient knows to call the office with questions or concerns. [facility] number and contact information provided [phone].  Hamda Klutts A. Lucila, PharmD, BCOP, CPP 01/20/2024  1:31 PM

## 2024-01-20 NOTE — Telephone Encounter (Signed)
 Called patient, daughter Sims) answered. She is listed in the [facility]. Maggie states patient does not need neither of the two requested medications. Patient states oncologist discontinued medication. tbw

## 2024-01-20 NOTE — Telephone Encounter (Signed)
 Oral Oncology Patient Advocate Encounter  Prior Authorization for Temozolomide  has been approved.    PA# [id number] Effective dates: 01/20/24 through 01/18/25  Patients co-pay is $0.00.    [name], CPhT   [facility] Specialty Pharmacy Services Pharmacy Technician Patient Advocate Specialist II [facility] [place] Phone: [phone]  Fax: [phone] [name]

## 2024-01-20 NOTE — Progress Notes (Signed)
 Brain Tumor Assessment   Clinical social worker met with patient / caregiver daughter Annitta to complete assessments.      Patient distress screen score:     01/20/2024    1:25 PM  ONCBCN DISTRESS SCREENING  How much distress have you been experiencing in the past week? (0-10) 1  Emotional concerns type Worry or anxiety   Caregiver distress screen score: 6 Significant physical limits/changes identified:  Yes: difficulty sleeping (recent improvement w/ change in medication), talking, swollen arms/legs, processing, misplacing items, following verbal instruction       MOCA score:     01/20/2024    1:21 PM  Montreal Cognitive Assessment   Visuospatial/ Executive (0/5) 5  Naming (0/3) 3  Attention: Read list of digits (0/2) 2  Attention: Read list of letters (0/1) 1  Attention: Serial 7 subtraction starting at 100 (0/3) 3  Language: Repeat phrase (0/2) 2  Language : Fluency (0/1) 1  Abstraction (0/2) 2  Delayed Recall (0/5) 0  Orientation (0/6) 6  Total 25   Significant findings: Delayed recall: pt unable to recall any of the 5 words listed earlier in assessment  WHO Quality of Life scores:   Overall quality of life: 60   Physical health: 24   Psychological: 70   Social relationship: 80   Environment: 78   PATH2Caregiving score: 93      Devere JONELLE Manna, LCSW

## 2024-01-20 NOTE — Progress Notes (Signed)
 Specialty Pharmacy Initial Fill Coordination Note  Alexandra Walker is a 68 y.o. female contacted today regarding refills of specialty medication(s) Temozolomide  (TEMODAR ) .  Patient requested Delivery  on 01/22/24  to verified address [address]   [place] [address]   Medication will be filled on 01/21/24.   Patient is aware of $0.00 copayment.     [name], CPhT [place]  [name] Specialty Pharmacy Services Pharmacy Technician Patient Advocate Specialist II [facility] [place] Phone: [phone]  Fax: [phone] [name]

## 2024-01-21 ENCOUNTER — Telehealth: Payer: Self-pay

## 2024-01-21 ENCOUNTER — Other Ambulatory Visit: Payer: Self-pay

## 2024-01-21 ENCOUNTER — Other Ambulatory Visit (HOSPITAL_BASED_OUTPATIENT_CLINIC_OR_DEPARTMENT_OTHER): Payer: Self-pay

## 2024-01-21 NOTE — Addendum Note (Signed)
 Addendum  created 01/21/24 1835 by Keneth Lynwood POUR, MD   Attestation recorded in Intraprocedure, Intraprocedure Attestations filed

## 2024-01-21 NOTE — Telephone Encounter (Signed)
 Patient is calling to have us  fill out STD paperwork as soon as it is received.

## 2024-01-22 ENCOUNTER — Other Ambulatory Visit: Payer: Self-pay

## 2024-01-22 ENCOUNTER — Telehealth: Payer: Self-pay

## 2024-01-22 ENCOUNTER — Other Ambulatory Visit (HOSPITAL_COMMUNITY): Payer: Self-pay

## 2024-01-22 ENCOUNTER — Telehealth: Payer: Self-pay | Admitting: Internal Medicine

## 2024-01-22 ENCOUNTER — Encounter: Payer: Self-pay | Admitting: Family Medicine

## 2024-01-22 ENCOUNTER — Encounter: Payer: Self-pay | Admitting: *Deleted

## 2024-01-22 NOTE — Telephone Encounter (Signed)
 Patient sent over her Short Term Disability paperwork to be completed by Dr. Janjua. I reviewed the paperwork and determined that it was more appropriate for Dr. Buckley and his office staff to complete this paperwork as the patient is out of work due to her radiation and oncology treatment.   I contacted Rudell Cook RN who agreed to get this paper work to Dr. Buckley for completion.   Left voicemail for patient to call back to discuss transfer of STD paperwork to the Cancer Center.

## 2024-01-22 NOTE — Telephone Encounter (Signed)
Scheduled appointments per staff message. Talked with the patients daughter and she is aware of the made appointments for the patient.

## 2024-01-22 NOTE — Telephone Encounter (Signed)
 Form has been place in provider's box for completion

## 2024-01-23 ENCOUNTER — Ambulatory Visit

## 2024-01-23 ENCOUNTER — Other Ambulatory Visit: Payer: Self-pay

## 2024-01-23 ENCOUNTER — Emergency Department (HOSPITAL_BASED_OUTPATIENT_CLINIC_OR_DEPARTMENT_OTHER)

## 2024-01-23 ENCOUNTER — Emergency Department (HOSPITAL_BASED_OUTPATIENT_CLINIC_OR_DEPARTMENT_OTHER)
Admission: EM | Admit: 2024-01-23 | Discharge: 2024-01-24 | Disposition: A | Discharge status: Home / Self Care | Attending: Emergency Medicine | Admitting: Emergency Medicine

## 2024-01-23 ENCOUNTER — Encounter: Payer: Self-pay | Admitting: Internal Medicine

## 2024-01-23 ENCOUNTER — Encounter (HOSPITAL_BASED_OUTPATIENT_CLINIC_OR_DEPARTMENT_OTHER): Payer: Self-pay | Admitting: Urology

## 2024-01-23 ENCOUNTER — Other Ambulatory Visit (HOSPITAL_COMMUNITY): Payer: Self-pay

## 2024-01-23 DIAGNOSIS — Z510 Encounter for antineoplastic radiation therapy: Secondary | ICD-10-CM | POA: Diagnosis not present

## 2024-01-23 DIAGNOSIS — R509 Fever, unspecified: Secondary | ICD-10-CM | POA: Diagnosis not present

## 2024-01-23 DIAGNOSIS — R251 Tremor, unspecified: Secondary | ICD-10-CM | POA: Diagnosis not present

## 2024-01-23 DIAGNOSIS — R791 Abnormal coagulation profile: Secondary | ICD-10-CM | POA: Insufficient documentation

## 2024-01-23 DIAGNOSIS — M542 Cervicalgia: Secondary | ICD-10-CM | POA: Insufficient documentation

## 2024-01-23 DIAGNOSIS — N390 Urinary tract infection, site not specified: Secondary | ICD-10-CM | POA: Insufficient documentation

## 2024-01-23 DIAGNOSIS — B9689 Other specified bacterial agents as the cause of diseases classified elsewhere: Secondary | ICD-10-CM | POA: Insufficient documentation

## 2024-01-23 DIAGNOSIS — Z7982 Long term (current) use of aspirin: Secondary | ICD-10-CM | POA: Insufficient documentation

## 2024-01-23 DIAGNOSIS — R519 Headache, unspecified: Secondary | ICD-10-CM | POA: Insufficient documentation

## 2024-01-23 DIAGNOSIS — N3000 Acute cystitis without hematuria: Secondary | ICD-10-CM

## 2024-01-23 LAB — URINALYSIS, W/ REFLEX TO CULTURE (INFECTION SUSPECTED)
Bilirubin Urine: NEGATIVE
Glucose, UA: NEGATIVE mg/dL
Ketones, ur: NEGATIVE mg/dL
Nitrite: POSITIVE — AB
Protein, ur: NEGATIVE mg/dL
Specific Gravity, Urine: 1.02 (ref 1.005–1.030)
WBC, UA: >50 WBC/hpf (ref 0–5)
pH: 6 (ref 5.0–8.0)

## 2024-01-23 LAB — COMPREHENSIVE METABOLIC PANEL WITH GFR
ALT: 41 U/L (ref 0–44)
AST: 27 U/L (ref 15–41)
Albumin: 4.1 g/dL (ref 3.5–5.0)
Alkaline Phosphatase: 100 U/L (ref 38–126)
Anion gap: 10 (ref 5–15)
BUN: 13 mg/dL (ref 8–23)
CO2: 26 mmol/L (ref 22–32)
Calcium: 9.1 mg/dL (ref 8.9–10.3)
Chloride: 103 mmol/L (ref 98–111)
Creatinine, Ser: 0.64 mg/dL (ref 0.44–1.00)
GFR, Estimated: >60 mL/min (ref >60)
Glucose, Bld: 195 mg/dL — ABNORMAL HIGH (ref 70–99)
Potassium: 3.5 mmol/L (ref 3.5–5.1)
Sodium: 140 mmol/L (ref 135–145)
Total Bilirubin: <0.2 mg/dL (ref 0.0–1.2)
Total Protein: 6.3 g/dL — ABNORMAL LOW (ref 6.5–8.1)

## 2024-01-23 LAB — CBC WITH DIFFERENTIAL/PLATELET
Abs Immature Granulocytes: 0.02 K/uL (ref 0.00–0.07)
Basophils Absolute: 0 K/uL (ref 0.0–0.1)
Basophils Relative: 0 %
Eosinophils Absolute: 0.2 K/uL (ref 0.0–0.5)
Eosinophils Relative: 5 %
HCT: 42.9 % (ref 36.0–46.0)
Hemoglobin: 13.8 g/dL (ref 12.0–15.0)
Immature Granulocytes: 1 %
Lymphocytes Relative: 26 %
Lymphs Abs: 0.9 K/uL (ref 0.7–4.0)
MCH: 28.2 pg (ref 26.0–34.0)
MCHC: 32.2 g/dL (ref 30.0–36.0)
MCV: 87.6 fL (ref 80.0–100.0)
Monocytes Absolute: 0.3 K/uL (ref 0.1–1.0)
Monocytes Relative: 8 %
Neutro Abs: 2 K/uL (ref 1.7–7.7)
Neutrophils Relative %: 60 %
Platelets: 177 K/uL (ref 150–400)
RBC: 4.9 MIL/uL (ref 3.87–5.11)
RDW: 14.4 % (ref 11.5–15.5)
WBC: 3.4 K/uL — ABNORMAL LOW (ref 4.0–10.5)
nRBC: 0 % (ref 0.0–0.2)

## 2024-01-23 LAB — RESP PANEL BY RT-PCR (RSV, FLU A&B, COVID)  RVPGX2
Influenza A by PCR: NEGATIVE
Influenza B by PCR: NEGATIVE
Resp Syncytial Virus by PCR: NEGATIVE
SARS Coronavirus 2 by RT PCR: NEGATIVE

## 2024-01-23 LAB — LACTIC ACID, PLASMA: Lactic Acid, Venous: 2.1 mmol/L (ref 0.5–1.9)

## 2024-01-23 MED ORDER — LACTATED RINGERS IV SOLN: INTRAVENOUS | Status: DC

## 2024-01-23 MED ORDER — SODIUM CHLORIDE 0.9 % IV BOLUS
1000.0000 mL | Freq: Once | INTRAVENOUS | Status: AC
  Administered 2024-01-24: 1000 mL via INTRAVENOUS

## 2024-01-23 MED ORDER — SODIUM CHLORIDE 0.9 % IV SOLN: 2.0000 g | Freq: Once | INTRAVENOUS | Status: DC

## 2024-01-23 MED ORDER — SODIUM CHLORIDE 0.9 % IV SOLN
1.0000 g | Freq: Once | INTRAVENOUS | Status: AC
  Administered 2024-01-24: 1 g via INTRAVENOUS
  Filled 2024-01-23: qty 10

## 2024-01-23 NOTE — ED Notes (Signed)
Attempted ultrasound IV x 2 without success. Patient tolerated well 

## 2024-01-23 NOTE — ED Notes (Signed)
 ED Provider at bedside.

## 2024-01-23 NOTE — ED Notes (Signed)
 Pt. Speaks clear sentences with no distress noted.  Pt. Does reports she is 25 days out from brain surgery.  Pt. Has no physical deficits.

## 2024-01-23 NOTE — ED Provider Notes (Signed)
 [place] [facility] Provider Note   CSN: [id number] Arrival date & time: 01/23/24  2022     Patient presents with: Fever   Alexandra Walker is a 68 y.o. female who presents emergency department with a chief complaint of chills.  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  She underwent resection with Dr. Rashid Janjua and is currently getting ready to begin radiation therapy.  Patient reports that today she developed shaking chills and had an elevated temperature up to 100.1 at home.  She reports she has a history of Klebsiella bacteremia from pyelonephritis and was concerned she could be developing recurrent sepsis.  She does report having some neck pain but states that she has been staring at her screen all week and knitting so if she thinks that it is muscular she does have a posterior headache on both sides of her head.  She states is different from the headache she was having earlier which was described as an ice pack on the right side.  She denies any new neurologic deficits such as changes in vision and speech changes weakness on one side of the body.  {Add pertinent medical, surgical, social history, OB history to HPI:32947}  Fever      Prior to Admission medications   Medication Sig Start Date End Date Taking? Authorizing Provider  aspirin  325 MG tablet Take 325 mg by mouth daily.    [provider]  baclofen  (LIORESAL ) 10 MG tablet Take 1 tablet (10 mg total) by mouth 2 (two) times daily. 12/31/23   Janjua, Rashid M, MD  butalbital -acetaminophen -caffeine  (FIORICET ) 50-325-40 MG tablet Take 2 tablets by mouth every 4 (four) hours as needed for headache (moderate headache). 01/20/24   Jodie Lavern LITTIE, MD  cholecalciferol  (VITAMIN D ) 1000 UNITS tablet Take 2,000 Units by mouth daily.     [provider]  cyanocobalamin  (VITAMIN B12) 1000 MCG tablet Take 1,000 mcg by mouth daily.    [provider]  fluticasone  (FLONASE ) 50 MCG/ACT nasal spray Place 1 spray into both nostrils in the morning and at bedtime. 03/28/22   Jodie Lavern LITTIE, MD  furosemide  (LASIX ) 40 MG tablet Take 1 tablet (40 mg total) by mouth daily. Patient taking differently: Take 40 mg by mouth daily as needed. 03/10/23   Verlin Lonni BIRCH, MD  levETIRAcetam  (KEPPRA ) 500 MG tablet Take 1 tablet (500 mg total) by mouth 2 (two) times daily. 12/31/23   Janjua, Rashid M, MD  losartan  (COZAAR ) 100 MG tablet Take 1 tablet (100 mg total) by mouth daily. 09/29/23 09/28/24  Jodie Lavern LITTIE, MD  Lutein 6 MG CAPS Take 6 mg by mouth daily. 10/02/17   [provider]  metoprolol  succinate (TOPROL -XL) 100 MG 24 hr tablet Take 1 tablet (100 mg total) by mouth daily with or immediately following a meal. 10/20/23   Jodie Lavern LITTIE, MD  Multiple Vitamin (MULTIVITAMIN WITH MINERALS) TABS tablet Take 1 tablet by mouth daily.    [provider]  ondansetron  (ZOFRAN ) 8 MG tablet Take 1 tablet (8 mg total) by mouth every 8 (eight) hours as needed for nausea or vomiting. May take 30-60 minutes prior to Temodar  administration if nausea/vomiting occurs as needed. Patient not taking: Reported on 01/20/2024 01/26/24   Vaslow, Zachary K, MD  potassium chloride  SA (KLOR-CON  M) 20 MEQ tablet Take 1 tablet (20 mEq total) by mouth daily. 07/15/22   Verlin Lonni BIRCH, MD  rosuvastatin  (  CRESTOR ) 20 MG tablet Take 1 tablet (20 mg total) by mouth daily. 08/11/23 08/10/24  [name], MD  temozolomide  (TEMODAR ) 180 MG capsule Take 1 capsule (180 mg total) by mouth daily. May take on an empty stomach to decrease nausea & vomiting. Patient not taking: Reported on 01/20/2024 01/26/24   Vaslow, Zachary K, MD  traZODone  (DESYREL ) 50 MG tablet Take 0.5-1 tablets (25-50 mg total) by mouth at bedtime as needed for sleep. 01/13/24   [name], MD    Allergies: Ace inhibitors, Dust mite extract, Oxycodone  hcl, Oxycodone -acetaminophen ,  Tree extract, Vicodin [hydrocodone-acetaminophen ], Victoza [liraglutide], and Adhesive [tape]    Review of Systems  Constitutional:  Positive for fever.    Updated Vital Signs BP (!) 154/73 (BP Location: Right Arm)   Pulse 78   Temp 99.9 F (37.7 C) (Oral)   Resp 20   Ht 5' 6 (1.676 m)   Wt 116.6 kg   LMP 05/20/2008   SpO2 100%   BMI 41.49 kg/m   Physical Exam Vitals and nursing note reviewed.  Constitutional:      General: She is not in acute distress.    Appearance: She is well-developed. She is not diaphoretic.  HENT:     Head: Normocephalic and atraumatic.     Right Ear: External ear normal.     Left Ear: External ear normal.     Nose: Nose normal.     Mouth/Throat:     Mouth: Mucous membranes are moist.  Eyes:     General: No scleral icterus.    Extraocular Movements: Extraocular movements intact.     Conjunctiva/sclera: Conjunctivae normal.     Pupils: Pupils are equal, round, and reactive to light.  Cardiovascular:     Rate and Rhythm: Normal rate and regular rhythm.     Heart sounds: Normal heart sounds. No murmur heard.    No friction rub. No gallop.  Pulmonary:     Effort: Pulmonary effort is normal. No respiratory distress.     Breath sounds: Normal breath sounds.  Abdominal:     General: Bowel sounds are normal. There is no distension.     Palpations: Abdomen is soft. There is no mass.     Tenderness: There is no abdominal tenderness. There is no guarding.  Musculoskeletal:     Cervical back: Normal range of motion. No rigidity.  Skin:    General: Skin is warm and dry.  Neurological:     General: No focal deficit present.     Mental Status: She is alert and oriented to person, place, and time.     Gait: Gait normal.  Psychiatric:        Behavior: Behavior normal.     (all labs ordered are listed, but only abnormal results are displayed) Labs Reviewed  RESP PANEL BY RT-PCR (RSV, FLU A&B, COVID)  [id number]    EKG: None  Radiology: No  results found.  {Document cardiac monitor, telemetry assessment procedure when appropriate:32947} Procedures   Medications Ordered in the ED - No data to display  Clinical Course as of 01/24/24 0019  Fri Jan 23, 2024  2346 Comprehensive metabolic panel(!) [AH]  2346 Glucose(!): 195 [AH]  2346 WBC(!): 3.4 [AH]  2346 Lactic Acid, Venous(!!): 2.1 [AH]  2346 Urinalysis, w/ Reflex to Culture (Infection Suspected) -Urine, Clean Catch(!) Urine appears infected  [AH]  Sat Jan 24, 2024  0007 Patient with obvious UTI.  I discussed findings with Dr. Voncile- Recommends CT head to rule  out abnormal fluids collection. If negative may follow up op. If positive may need transfer For MRI w/wo [AH]    Clinical Course User Index [AH] Artemio Dobie, PA-C   {Click here for ABCD2, HEART and other calculators REFRESH Note before signing:1}                              Medical Decision Making Amount and/or Complexity of Data Reviewed Labs: ordered. Decision-making details documented in ED Course. Radiology: ordered.  Risk Prescription drug management.    Patient here with shaking chills, + uti CT head pending.  She is comfortable with d/c with tx for uti  If abnormal fluid collecion on CT may need transfer for MRI w/wo  {Document critical care time when appropriate  Document review of labs and clinical decision tools ie CHADS2VASC2, etc  Document your independent review of radiology images and any outside records  Document your discussion with family members, caretakers and with consultants  Document social determinants of health affecting pt's care  Document your decision making why or why not admission, treatments were needed:32947:::1}   Final diagnoses:  None    ED Discharge Orders     None

## 2024-01-23 NOTE — ED Triage Notes (Signed)
 Pt states lower back pain, chills and fever, headache, also states white fingers and toes x 2 hours h/o sepsis   Had brain sx on 8/11 Supposed to started chemo on 9/8 for brain cancer

## 2024-01-24 ENCOUNTER — Encounter: Payer: Self-pay | Admitting: Internal Medicine

## 2024-01-24 ENCOUNTER — Emergency Department (HOSPITAL_BASED_OUTPATIENT_CLINIC_OR_DEPARTMENT_OTHER)

## 2024-01-24 DIAGNOSIS — R519 Headache, unspecified: Secondary | ICD-10-CM | POA: Diagnosis not present

## 2024-01-24 LAB — PROTIME-INR
INR: 0.8 (ref 0.8–1.2)
Prothrombin Time: 12 s (ref 11.4–15.2)

## 2024-01-24 LAB — LACTIC ACID, PLASMA: Lactic Acid, Venous: 1.9 mmol/L (ref 0.5–1.9)

## 2024-01-24 MED ORDER — CEPHALEXIN 500 MG PO CAPS
500.0000 mg | ORAL_CAPSULE | Freq: Three times a day (TID) | ORAL | 0 refills | Status: DC
  Filled 2024-01-24: qty 21, 7d supply, fill #0

## 2024-01-24 MED ORDER — BUTALBITAL-APAP-CAFFEINE 50-325-40 MG PO TABS
2.0000 | ORAL_TABLET | Freq: Once | ORAL | Status: AC
  Administered 2024-01-24: 2 via ORAL
  Filled 2024-01-24: qty 2

## 2024-01-24 NOTE — ED Provider Notes (Signed)
  Physical Exam  BP (!) 154/78   Pulse 82   Temp 100.1 F (37.8 C) (Oral)   Resp (!) 29   Ht 5' 6 (1.676 m)   Wt 116.6 kg   LMP 05/20/2008   SpO2 94%   BMI 41.49 kg/m   Physical Exam Vitals and nursing note reviewed.  Constitutional:      Appearance: Normal appearance.  HENT:     Head: Normocephalic and atraumatic.  Pulmonary:     Effort: Pulmonary effort is normal.  Skin:    General: Skin is warm and dry.  Neurological:     Mental Status: She is alert and oriented to person, place, and time.     Procedures  Procedures  ED Course / MDM   Clinical Course as of 01/24/24 0200  Fri Jan 23, 2024  2346 Comprehensive metabolic panel(!) [AH]  2346 Glucose(!): 195 [AH]  2346 WBC(!): 3.4 [AH]  2346 Lactic Acid, Venous(!!): 2.1 [AH]  2346 Urinalysis, w/ Reflex to Culture (Infection Suspected) -Urine, Clean Catch(!) Urine appears infected  [AH]  Sat Jan 24, 2024  0007 Patient with obvious UTI.  I discussed findings with Dr. Voncile- Recommends CT head to rule out abnormal fluids collection. If negative may follow up op. If positive may need transfer For MRI w/wo [AH]    Clinical Course User Index [AH] Arloa Chroman, PA-C   Medical Decision Making Amount and/or Complexity of Data Reviewed Labs: ordered. Decision-making details documented in ED Course. Radiology: ordered.  Risk Prescription drug management.   Care assumed from Dr. Darra and Chroman Arloa at shift change.  Patient awaiting results of a CT scan of the head.  Briefly, patient is a 68 year old female recently diagnosed with GBM and is status postcraniotomy.  She arrives here febrile and workup revealing a urinary tract infection.  She is receiving IV Rocephin .  CT scan of the head has resulted showing nothing acute.  She has been given Fioricet  for her headache which seems to be helping.  Repeat lactate has decreased from 2.1 to and 1.9.  Patient would prefer to go home and I feel as though this is  reasonable.  I will prescribe Keflex  for the UTI and have her return as needed.  There are blood cultures pending.       Geroldine Berg, MD 01/24/24 [phone]

## 2024-01-24 NOTE — Discharge Instructions (Signed)
 Begin taking Keflex  as prescribed.  Drink plenty of fluids.  Return to the emergency department if you develop any new and/or concerning issues.  We will call you if your cultures indicate you require any changes in your antibiotic coverage.

## 2024-01-24 NOTE — ED Notes (Signed)
 Patient transported to CT

## 2024-01-25 ENCOUNTER — Other Ambulatory Visit (HOSPITAL_BASED_OUTPATIENT_CLINIC_OR_DEPARTMENT_OTHER): Payer: Self-pay

## 2024-01-25 ENCOUNTER — Encounter: Payer: Self-pay | Admitting: Cardiovascular Disease

## 2024-01-25 ENCOUNTER — Telehealth: Payer: Self-pay | Admitting: Advanced Practice Midwife

## 2024-01-25 ENCOUNTER — Other Ambulatory Visit: Payer: Self-pay | Admitting: Advanced Practice Midwife

## 2024-01-25 MED ORDER — CEPHALEXIN 500 MG PO CAPS: 500.0000 mg | ORAL_CAPSULE | Freq: Three times a day (TID) | ORAL | 0 refills | Status: DC

## 2024-01-25 NOTE — Telephone Encounter (Signed)
 Pt called to report emergency room visit on 01/23/24 with fever and dx of UTI.  IV abx were administered at ED visit and Rx sent to pharmacy for Keflex  course.  Rx sent to [facility] pharmacy, which is closed on Sunday, and pt needs to begin medication today.  Rx resent to [facility] in [place].  Pt to follow up with oncologist on 01/26/24.

## 2024-01-25 NOTE — Progress Notes (Signed)
 Pharmacy changed and Rx sent to new pharmacy. See telephone note 01/25/24.

## 2024-01-26 ENCOUNTER — Ambulatory Visit
Admission: RE | Admit: 2024-01-26 | Discharge: 2024-01-26 | Disposition: A | Discharge status: Home / Self Care | Source: Ambulatory Visit | Attending: Radiation Oncology | Admitting: Radiation Oncology

## 2024-01-26 ENCOUNTER — Other Ambulatory Visit: Payer: Self-pay

## 2024-01-26 ENCOUNTER — Encounter: Payer: Self-pay | Admitting: Internal Medicine

## 2024-01-26 ENCOUNTER — Ambulatory Visit
Admission: RE | Admit: 2024-01-26 | Discharge: 2024-01-26 | Disposition: A | Discharge status: Home / Self Care | Source: Ambulatory Visit | Attending: Radiation Oncology

## 2024-01-26 DIAGNOSIS — C719 Malignant neoplasm of brain, unspecified: Secondary | ICD-10-CM

## 2024-01-26 DIAGNOSIS — Z510 Encounter for antineoplastic radiation therapy: Secondary | ICD-10-CM | POA: Diagnosis not present

## 2024-01-26 LAB — RAD ONC ARIA SESSION SUMMARY
Course Elapsed Days: 0
Plan Fractions Treated to Date: 1
Plan Prescribed Dose Per Fraction: 2 Gy
Plan Total Fractions Prescribed: 23
Plan Total Prescribed Dose: 46 Gy
Reference Point Dosage Given to Date: 2 Gy
Reference Point Session Dosage Given: 2 Gy
Session Number: 1

## 2024-01-26 LAB — URINE CULTURE: Culture: >=100000 — AB

## 2024-01-26 MED ORDER — SONAFINE EX EMUL
1.0000 | Freq: Two times a day (BID) | CUTANEOUS | Status: DC
  Administered 2024-01-26: 1 via TOPICAL

## 2024-01-27 ENCOUNTER — Telehealth: Payer: Self-pay | Admitting: Neurosurgery

## 2024-01-27 ENCOUNTER — Encounter: Payer: Self-pay | Admitting: *Deleted

## 2024-01-27 ENCOUNTER — Other Ambulatory Visit (HOSPITAL_BASED_OUTPATIENT_CLINIC_OR_DEPARTMENT_OTHER): Payer: Self-pay

## 2024-01-27 ENCOUNTER — Other Ambulatory Visit: Payer: Self-pay

## 2024-01-27 ENCOUNTER — Telehealth (HOSPITAL_BASED_OUTPATIENT_CLINIC_OR_DEPARTMENT_OTHER): Payer: Self-pay | Admitting: *Deleted

## 2024-01-27 ENCOUNTER — Ambulatory Visit
Admission: RE | Admit: 2024-01-27 | Discharge: 2024-01-27 | Disposition: A | Discharge status: Home / Self Care | Source: Ambulatory Visit | Attending: Radiation Oncology

## 2024-01-27 ENCOUNTER — Other Ambulatory Visit: Payer: Self-pay | Admitting: Internal Medicine

## 2024-01-27 DIAGNOSIS — Z510 Encounter for antineoplastic radiation therapy: Secondary | ICD-10-CM | POA: Diagnosis not present

## 2024-01-27 DIAGNOSIS — C712 Malignant neoplasm of temporal lobe: Secondary | ICD-10-CM | POA: Diagnosis not present

## 2024-01-27 LAB — RAD ONC ARIA SESSION SUMMARY
Course Elapsed Days: 1
Plan Fractions Treated to Date: 2
Plan Prescribed Dose Per Fraction: 2 Gy
Plan Total Fractions Prescribed: 23
Plan Total Prescribed Dose: 46 Gy
Reference Point Dosage Given to Date: 4 Gy
Reference Point Session Dosage Given: 2 Gy
Session Number: 2

## 2024-01-27 MED ORDER — PROCHLORPERAZINE MALEATE 10 MG PO TABS
10.0000 mg | ORAL_TABLET | Freq: Four times a day (QID) | ORAL | 0 refills | Status: DC | PRN
  Filled 2024-01-27: qty 30, 8d supply, fill #0

## 2024-01-27 NOTE — Progress Notes (Unsigned)
 Alexandra Walker

## 2024-01-27 NOTE — Therapy (Signed)
 OUTPATIENT PHYSICAL THERAPY NEURO EVALUATION   Patient Name: Alexandra Walker MRN: [id number] DOB:Feb 07, 1956, 68 y.o., female Today's Date: 01/27/2024   PCP:   Jodie Lavern LITTIE, MD   REFERRING PROVIDER: Rosslyn Dino HERO, MD  END OF SESSION:   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   Diabetes (HCC)    Diverticulosis    Minimal   Fatty liver    GERD (gastroesophageal reflux disease)    Heart murmur    Hepatic steatosis    History of hiatal hernia 2017   Small   HLA B27 (HLA B27 positive)    Hx of pancreatitis    Hyperlipidemia    Hypertension    IBS (irritable bowel syndrome)    Ischemic colitis (HCC)    Lactose intolerance    Lumbar scoliosis 2010   Mild   Migraine headache without aura 05/10/2015   Multiple thyroid  nodules    Nephrolithiasis 2014   Left, minimal   Obesity due to excess calories with serious comorbidity 05/24/2019   Palpitations    PONV (postoperative nausea and vomiting)    Prediabetes    Presbyopia    Right bundle branch block    Sepsis (HCC) 01/2016   Sleep apnea    Sleep hypopnea    Vitamin D  deficiency    Past Surgical History:  Procedure Laterality Date   APPLICATION OF CRANIAL NAVIGATION N/A 12/29/2023   Procedure: COMPUTER-ASSISTED NAVIGATION, FOR CRANIAL PROCEDURE;  Surgeon: Rosslyn Dino HERO, MD;  Location: [facility] OR;  Service: Neurosurgery;  Laterality: N/A;   BREAST BIOPSY Left    CERVICAL CONE BIOPSY     CHOLECYSTECTOMY N/A 10/26/2014   Procedure: LAPAROSCOPIC CHOLECYSTECTOMY;  Surgeon: Vicenta Poli, MD;  Location: [facility];  Service: General;  Laterality: N/A;   COLONOSCOPY W/ BIOPSIES  multiple   CRANIOTOMY Right 12/29/2023   Procedure: CRANIOTOMY TUMOR EXCISION;  Surgeon: Rosslyn Dino HERO, MD;  Location: [facility] OR;  Service: Neurosurgery;  Laterality: Right;   CYSTOSCOPY WITH RETROGRADE PYELOGRAM, URETEROSCOPY AND STENT PLACEMENT Left 06/26/2018   Procedure: CYSTOSCOPY WITH RETROGRADE PYELOGRAM, URETEROSCOPY AND STENT PLACEMENT;  Surgeon: Alvaro Hummer, MD;  Location: [facility];  Service: Urology;  Laterality: Left;   DIAGNOSTIC LAPAROSCOPY     DILATION AND CURETTAGE OF UTERUS  11/2009   DUPUYTREN CONTRACTURE RELEASE Left    x 3 , Dr Camella   ESOPHAGOGASTRODUODENOSCOPY  multiple   EYE SURGERY     HOLMIUM LASER APPLICATION Left 06/26/2018   Procedure: HOLMIUM LASER APPLICATION;  Surgeon: Alvaro Hummer, MD;  Location: [facility];  Service: Urology;  Laterality: Left;   KNEE ARTHROSCOPY Left 11/2008   LAPAROSCOPIC VAGINAL HYSTERECTOMY WITH SALPINGO OOPHORECTOMY Bilateral 10/03/2015   Procedure: LAPAROSCOPIC ASSISTED VAGINAL HYSTERECTOMY WITH SALPINGECTOMY;  Surgeon: Shanda SHAUNNA Muscat, MD;  Location: [facility];  Service: Gynecology;  Laterality: Bilateral;   LASIK  1990   RK   LEFT FINGER SURGERY     LIVER BIOPSY     MASS EXCISION Left 12/24/2012   Procedure: LEFT EXCISION OF PALMAR  MASS X TWO;  Surgeon: Elsie Mussel, MD;  Location: [facility];  Service: Orthopedics;  Laterality: Left;   PHOTOREFRACTIVE KERATOTOMY  1994   RADIAL KERATOTOMY  1994   TRANSOBTURATOR SLING     Patient Active Problem List   Diagnosis Date Noted   Cancer of temporal lobe (HCC) 01/09/2024   Glioblastoma, IDH-wildtype (HCC) 12/29/2023   Intracranial mass 12/26/2023   Multiple thyroid  nodules 11/19/2023   Thyroid  nodule 10/07/2023   Osteopenia after menopause 06/13/2023   Sprain of interphalangeal joint of right little finger 06/03/2023   Shifting sleep-work schedule,  affecting sleep 10/07/2022   Sleep related bruxism 10/07/2022   Esophageal stricture 09/02/2022   Bilateral lower extremity edema 09/02/2022   Controlled type 2 diabetes mellitus without complication, without long-term current use of insulin  (HCC) 05/27/2022   Morbid obesity (HCC) 08/07/2020   Bilateral hip bursitis 05/06/2019   Pain of right hip joint 03/04/2019   Diverticulosis of colon 05/16/2018   Drug-induced pancreatitis, 2015, thought to be due to [facility] 03/29/2018   Nephrolithiasis 03/29/2018   History of vitreous detachment OU, HTN retinopathy OU, High myopia OU, Cataracts OU, Vitreous floaters OU, Keratoconjunctivitis sicca OU not specified as Sjogren's 03/29/2018   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HCC)    Attention deficit hyperactivity disorder (ADHD), combined type 10/19/2017   Recurrent circadian rhythm sleep disorder, 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  Rationale for Evaluation and Treatment: Rehabilitation  SUBJECTIVE:                                                                                                                                                                                             SUBJECTIVE STATEMENT: *** Pt accompanied by: {accompnied:27141}  PERTINENT HISTORY: ADD, anticardiolipin syndrome, depression, CHF, DM, hiatal hernia, HLD, HTN, migraine  PAIN:  Are you having pain?  {OPRCPAIN:27236}  PRECAUTIONS: {Therapy precautions:24002}  RED FLAGS: {PT Red Flags:29287}   WEIGHT BEARING RESTRICTIONS: {Yes ***/No:24003}  FALLS: Has patient fallen in last 6 months? {fallsyesno:27318}  LIVING ENVIRONMENT: Lives with: {OPRC lives with:25569::lives with their family} Lives in: {Lives in:25570} Stairs: {opstairs:27293}  Has following equipment at home: {Assistive devices:23999}  PLOF: {PLOF:24004}  PATIENT GOALS: ***  OBJECTIVE:  Note: Objective measures were completed at Evaluation unless otherwise noted.  DIAGNOSTIC FINDINGS: 12/26/23 underwent an MRI of the brain with contrast which redemonstrated: the posterior right temporal lobe mass highly suspicious for high-grade primary glial neoplasm with avid peripheral enhancement and central necrosis measuring approximately 4.5 x 3.9 cm, with associated dural enhancement extending anteriorly along the right temporal convexity. underwent a right craniotomy for resection of the posterior right temporal lobe mass on 12/29/23 under the care of Dr. Rosslyn. 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  COGNITION: Overall cognitive status: {cognition:24006}   SENSATION: {sensation:27233}  COORDINATION: Alternating pronation/supination: *** Alternating toe tap: *** Finger to nose: *** Heel to shin: ***    MUSCLE TONE: {LE tone:25568}  POSTURE: {posture:25561}  LOWER EXTREMITY ROM:     Active  Right Eval Left Eval  Hip flexion    Hip extension    Hip abduction    Hip adduction    Hip internal rotation    Hip external rotation    Knee flexion    Knee extension    Ankle dorsiflexion    Ankle plantarflexion    Ankle inversion    Ankle eversion     (Blank rows = not tested)  LOWER EXTREMITY MMT:    MMT Right Eval Left Eval  Hip flexion    Hip extension    Hip abduction    Hip adduction    Hip internal rotation    Hip external rotation    Knee flexion    Knee extension     Ankle dorsiflexion    Ankle plantarflexion    Ankle inversion    Ankle eversion    (Blank rows = not tested)  GAIT: Findings: Assistive device utilized:{Assistive devices:23999}, Level of assistance: {Levels of assistance:24026}, and Comments: ***  FUNCTIONAL TESTS:  {Functional tests:24029}  PATIENT SURVEYS:  {rehab surveys:24030}                                                                                                                              TREATMENT DATE: ***    PATIENT EDUCATION: Education details: *** Person educated: {Person educated:25204} Education method: {Education Method:25205} Education comprehension: {Education Comprehension:25206}  HOME EXERCISE PROGRAM: ***  GOALS: Goals reviewed with patient? Yes  SHORT TERM GOALS: Target date: {follow up:25551}  Patient to be independent with initial HEP. Baseline: HEP initiated Goal status: {GOALSTATUS:25110}    LONG TERM GOALS: Target date: {follow up:25551}  Patient to be independent with advanced HEP. Baseline: Not yet initiated  Goal status: {GOALSTATUS:25110}  Patient to demonstrate B LE strength >/=4+/5.  Baseline: See above Goal status: {GOALSTATUS:25110}  Patient to demonstrate *** ROM WFL and without pain limiting.  Baseline: *** Goal status: {GOALSTATUS:25110}  Patient to report and demonstrate improved head, neck, and shoulder posture at rest and with activity.  Baseline: *** Goal status: {  GOALSTATUS:25110}  Patient to demonstrate alternating reciprocal pattern when ascending and descending stairs with good stability and 1 handrail as needed.   Baseline: Unable Goal status: {GOALSTATUS:25110}  Patient to score at least 20/24 on DGI in order to decrease risk of falls.  Baseline: *** Goal status: {GOALSTATUS:25110}  Patient to complete TUG in <14 sec with LRAD in order to decrease risk of falls.   Baseline: *** Goal status: {GOALSTATUS:25110}  Patient to demonstrate 5xSTS  test in <15 sec in order to decrease risk of falls.  Baseline: *** Goal status: {GOALSTATUS:25110}  Patient to score at least ***/56 on Berg in order to decrease risk of falls.  Baseline: *** Goal status: {GOALSTATUS:25110}  Patient to score at least *** on FOTO in order to indicate improved functional outcomes.  Baseline: *** Goal status: {GOALSTATUS:25110}  ASSESSMENT:  CLINICAL IMPRESSION:  Patient is a 68 y/o F presenting to OPPT with c/o *** s/p right craniotomy for resection of the posterior right temporal lobe mass on 12/29/23.  Patient today presenting with ***.    Patient was educated on gentle *** HEP and reported understanding. Prior to current episode, patient was independent. Would benefit from skilled PT services *** x/week for *** weeks to address aforementioned impairments in order to optimize level of function.    OBJECTIVE IMPAIRMENTS: {opptimpairments:25111}.   ACTIVITY LIMITATIONS: {activitylimitations:27494}  PARTICIPATION LIMITATIONS: {participationrestrictions:25113}  PERSONAL FACTORS: {Personal factors:25162} are also affecting patient's functional outcome.   REHAB POTENTIAL: {rehabpotential:25112}  CLINICAL DECISION MAKING: {clinical decision making:25114}  EVALUATION COMPLEXITY: {Evaluation complexity:25115}  PLAN:  PT FREQUENCY: {rehab frequency:25116}  PT DURATION: {rehab duration:25117}  PLANNED INTERVENTIONS: {rehab planned interventions:25118::97110-Therapeutic exercises,97530- Therapeutic [phone]- Neuromuscular re-education,97535- Self Rjmz,02859- Manual therapy}  PLAN FOR NEXT SESSION: ***   Slater MARLA Christians, PT 01/27/2024, 7:52 AM

## 2024-01-27 NOTE — Telephone Encounter (Signed)
 Post ED Visit - Positive Culture Follow-up  Culture report reviewed by antimicrobial stewardship pharmacist: [name] [x]  [name], Pharm.D. []  [name], Pharm.D., BCPS AQ-ID []  [name], Pharm.D., BCPS []  [name], Pharm.D., BCPS []  [name], [address].D., BCPS, AAHIVP []  [name], Pharm.D., BCPS, AAHIVP []  [name], PharmD, BCPS []  [name], PharmD, BCPS []  [name], PharmD, BCPS []  [name], PharmD []  [name], PharmD, BCPS []  [name], PharmD  [name] []  [name], PharmD []  [name], PharmD []  [name], PharmD []  [name], Rph []  [name], PharmD []  [name], PharmD []  [name], PharmD []  [name], PharmD []  [name], PharmD []  [name], PharmD []  [name], PharmD []  [name], PharmD []  [name], PharmD   Positive urine culture Treated with Cephalexin , organism sensitive to the same and no further patient follow-up is required at this time.  Alexandra Walker 01/27/2024, 9:04 AM

## 2024-01-27 NOTE — Telephone Encounter (Signed)
 We received FMLA paperwork from [employer] for patient. I took back to nurse's station

## 2024-01-28 ENCOUNTER — Ambulatory Visit

## 2024-01-28 ENCOUNTER — Encounter: Payer: Self-pay | Admitting: Physical Therapy

## 2024-01-28 ENCOUNTER — Ambulatory Visit
Admission: RE | Admit: 2024-01-28 | Discharge: 2024-01-28 | Disposition: A | Discharge status: Home / Self Care | Source: Ambulatory Visit | Attending: Radiation Oncology | Admitting: Radiation Oncology

## 2024-01-28 ENCOUNTER — Other Ambulatory Visit: Payer: Self-pay

## 2024-01-28 ENCOUNTER — Ambulatory Visit: Attending: Neurosurgery | Admitting: Physical Therapy

## 2024-01-28 DIAGNOSIS — R42 Dizziness and giddiness: Secondary | ICD-10-CM | POA: Diagnosis not present

## 2024-01-28 DIAGNOSIS — C712 Malignant neoplasm of temporal lobe: Secondary | ICD-10-CM | POA: Diagnosis not present

## 2024-01-28 DIAGNOSIS — R2681 Unsteadiness on feet: Secondary | ICD-10-CM | POA: Diagnosis not present

## 2024-01-28 DIAGNOSIS — M6281 Muscle weakness (generalized): Secondary | ICD-10-CM | POA: Insufficient documentation

## 2024-01-28 DIAGNOSIS — Z510 Encounter for antineoplastic radiation therapy: Secondary | ICD-10-CM | POA: Diagnosis not present

## 2024-01-28 DIAGNOSIS — M5459 Other low back pain: Secondary | ICD-10-CM | POA: Insufficient documentation

## 2024-01-28 LAB — RAD ONC ARIA SESSION SUMMARY
Course Elapsed Days: 2
Plan Fractions Treated to Date: 3
Plan Prescribed Dose Per Fraction: 2 Gy
Plan Total Fractions Prescribed: 23
Plan Total Prescribed Dose: 46 Gy
Reference Point Dosage Given to Date: 6 Gy
Reference Point Session Dosage Given: 2 Gy
Session Number: 3

## 2024-01-29 ENCOUNTER — Other Ambulatory Visit: Payer: Self-pay

## 2024-01-29 ENCOUNTER — Ambulatory Visit
Admission: RE | Admit: 2024-01-29 | Discharge: 2024-01-29 | Disposition: A | Discharge status: Home / Self Care | Source: Ambulatory Visit | Attending: Radiation Oncology

## 2024-01-29 ENCOUNTER — Encounter (HOSPITAL_COMMUNITY): Payer: Self-pay

## 2024-01-29 DIAGNOSIS — Z510 Encounter for antineoplastic radiation therapy: Secondary | ICD-10-CM | POA: Diagnosis not present

## 2024-01-29 DIAGNOSIS — C712 Malignant neoplasm of temporal lobe: Secondary | ICD-10-CM | POA: Diagnosis not present

## 2024-01-29 LAB — RAD ONC ARIA SESSION SUMMARY
Course Elapsed Days: 3
Plan Fractions Treated to Date: 4
Plan Prescribed Dose Per Fraction: 2 Gy
Plan Total Fractions Prescribed: 23
Plan Total Prescribed Dose: 46 Gy
Reference Point Dosage Given to Date: 8 Gy
Reference Point Session Dosage Given: 2 Gy
Session Number: 4

## 2024-01-29 LAB — CULTURE, BLOOD (ROUTINE X 2)
Culture: NO GROWTH
Culture: NO GROWTH
Special Requests: ADEQUATE

## 2024-01-30 ENCOUNTER — Encounter: Payer: Self-pay | Admitting: Neurosurgery

## 2024-01-30 ENCOUNTER — Ambulatory Visit (INDEPENDENT_AMBULATORY_CARE_PROVIDER_SITE_OTHER): Admitting: Neurosurgery

## 2024-01-30 ENCOUNTER — Ambulatory Visit: Payer: Self-pay | Admitting: Neurology

## 2024-01-30 ENCOUNTER — Ambulatory Visit
Admission: RE | Admit: 2024-01-30 | Discharge: 2024-01-30 | Disposition: A | Discharge status: Home / Self Care | Source: Ambulatory Visit | Attending: Radiation Oncology | Admitting: Radiation Oncology

## 2024-01-30 ENCOUNTER — Other Ambulatory Visit: Payer: Self-pay

## 2024-01-30 VITALS — BP 124/82 | HR 61 | Temp 98.4°F | Ht 66.0 in | Wt 262.6 lb

## 2024-01-30 DIAGNOSIS — C712 Malignant neoplasm of temporal lobe: Secondary | ICD-10-CM | POA: Diagnosis not present

## 2024-01-30 DIAGNOSIS — Z510 Encounter for antineoplastic radiation therapy: Secondary | ICD-10-CM | POA: Diagnosis not present

## 2024-01-30 DIAGNOSIS — C719 Malignant neoplasm of brain, unspecified: Secondary | ICD-10-CM

## 2024-01-30 DIAGNOSIS — G4726 Circadian rhythm sleep disorder, shift work type: Secondary | ICD-10-CM

## 2024-01-30 DIAGNOSIS — G4733 Obstructive sleep apnea (adult) (pediatric): Secondary | ICD-10-CM

## 2024-01-30 DIAGNOSIS — I471 Supraventricular tachycardia, unspecified: Secondary | ICD-10-CM

## 2024-01-30 DIAGNOSIS — G4485 Primary stabbing headache: Secondary | ICD-10-CM

## 2024-01-30 LAB — RAD ONC ARIA SESSION SUMMARY
Course Elapsed Days: 4
Plan Fractions Treated to Date: 5
Plan Prescribed Dose Per Fraction: 2 Gy
Plan Total Fractions Prescribed: 23
Plan Total Prescribed Dose: 46 Gy
Reference Point Dosage Given to Date: 10 Gy
Reference Point Session Dosage Given: 2 Gy
Session Number: 5

## 2024-01-30 NOTE — Progress Notes (Signed)
 68 year old lady with a right posterior temporal glioblastoma which we resected a few weeks ago.  She has just completed her first week of radiation and is doing remarkably well.  She had an episode of fever and a CT scan of the head demonstrated significantly reduced edema and only some encephalomalacia but some residual postoperative blood.  She is doing very well and I am really happy to see her progress.  I shared with her that the rest of the treatment is going to be with the radiation oncology team and oncology teams and hopefully she will never need my services however, if there is anything I can do for her, she is welcome to come and see me.  Her incision is well-healed and I do not have any concerns about this.  She plans to call me if there is ever anything I can do for her.

## 2024-01-30 NOTE — Procedures (Signed)
 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  Was on high dose steroids and has contracted a UTI, history of RBBB.  CPAP use typical for shift worker, 100% compliance by days and 77% compliance by hours,   Auto set CPAP 4-20 cm water, 95% pressure is  7 cm water and her nasal mask seals well, air leak at 0.9 L/m.    She wears a CPAP and a mouth guard, made to measure by her sleep dentist.     Epworth sleepiness score: ESS :  4/ 24 FSS: 35/ 63.  GDS 3/ 15    BMI: 42 kg/m   Neck Circumference: 15.5   FINDINGS:   Sleep Summary:   Total Recording Time (hours, min):   8 hours 57 minutes  Total Sleep Time (hours, min):   8 hours 7 minutes              Percent REM (%):     11%                                   Respiratory Indices:   Calculated pAHI (per AASM guideline): 11.6/h                        REM pAHI:   16.8/h                                              NREM pAHI:    10.9/h       There was a strong positional component , supine AHI was 24.6/h and non supine was 9/h.  Mild snoring only , 40 dB mean Volume.                                                              Oxygen  Saturation Statistics:   Oxygen  Saturation (%) Mean:     93%         O2 Saturation Range (%): Between the nadir of 84 and a maximum of 98%                                      O2 Saturation (minutes) <89%: 1.7 minutes         Pulse Rate Statistics: This home sleep test device cannot tell us  cardiac rhythm information -only the pulse rate is reflected.   Pulse Mean (bpm):    63 bpm             Pulse  Range:   Between 50 and 80 bpm  IMPRESSION:  This HST confirms the presence of mild and all obstructive sleep apnea.  This apnea is clearly REM sleep accentuated but not REM sleep dependent.  A clearly exacerbating factor was supine sleep of which the patient had 81 minutes on this recording, associated with an AHI of 24.6/h.  So avoiding supine sleep is important and continuation of CPAP as recommended.   RECOMMENDATION: Continuation of CPAP care is recommended, I also would love to remind the patient not to sleep on her back.  Snoring was very mild and is not a primary reason to use the CPAP.   Any patient should be cautioned not to drive, work at heights, or operate dangerous or heavy equipment when tired or sleepy.   Review of good sleep hygiene measures is accessible to any sleep clinic patient and can be reiterated through online material- I we recommend the Guide to better Sleep   by the NIH.   Weight loss and Core Strength improvement is highly recommended for individuals with low muscle tone and/ or a BMI over 30.  Any CPAP patient should be reminded to be fully compliant with PAP therapy , (defined as using PAP therapy for more than 4 hours each night ) with the goal to improve sleep related symptoms and decrease long term cardiovascular risks. Any PAP therapy patient should be reminded, that it may take up to 3 months to get fully used to using PAP and it may take 1-2 weeks for an established CPAP user to acclimatize to changes in pressure or mask. The earlier full compliance is achieved, the better long term compliance tends to be.   Please note that untreated obstructive sleep apnea may carry additional perioperative morbidity. Patients with significant obstructive sleep apnea should receive perioperative PAP therapy and the surgical team should be informed of the diagnosis and degree of sleep disordered breathing.  Sleep fragmentation in the presence of normal proportional  sleep stages is a nonspecific findings and per se does not signify an intrinsic sleep disorder or a cause for the patient's sleep-related symptoms.  Causes include (but are not limited to) the unfamiliarity of sleeping while recorded by HST device or sleeping in a sleep lab for a full Polysomnography sleep study, but also circadian rhythm disturbances, medication side effects or an underlying mood disorder or medical problem.   The referring physician will be notified of the test results.       INTERPRETING PHYSICIAN:   Dedra Gores, MD  [facility] Sleep Board certified by The [employer] of Sleep Medicine and Diplomate of the [employer] of Sleep Medicine. Board certified In Neurology through the ABPN, Fellow of the [employer] of Neurology.

## 2024-02-02 ENCOUNTER — Ambulatory Visit
Admission: RE | Admit: 2024-02-02 | Discharge: 2024-02-02 | Disposition: A | Discharge status: Home / Self Care | Source: Ambulatory Visit | Attending: Radiation Oncology

## 2024-02-02 ENCOUNTER — Ambulatory Visit
Admission: RE | Admit: 2024-02-02 | Discharge: 2024-02-02 | Disposition: A | Discharge status: Home / Self Care | Source: Ambulatory Visit | Attending: Radiation Oncology | Admitting: Radiation Oncology

## 2024-02-02 ENCOUNTER — Encounter: Payer: Self-pay | Admitting: Neurology

## 2024-02-02 ENCOUNTER — Other Ambulatory Visit: Payer: Self-pay

## 2024-02-02 DIAGNOSIS — C712 Malignant neoplasm of temporal lobe: Secondary | ICD-10-CM | POA: Diagnosis not present

## 2024-02-02 DIAGNOSIS — Z510 Encounter for antineoplastic radiation therapy: Secondary | ICD-10-CM | POA: Diagnosis not present

## 2024-02-02 LAB — RAD ONC ARIA SESSION SUMMARY
Course Elapsed Days: 7
Plan Fractions Treated to Date: 6
Plan Prescribed Dose Per Fraction: 2 Gy
Plan Total Fractions Prescribed: 23
Plan Total Prescribed Dose: 46 Gy
Reference Point Dosage Given to Date: 12 Gy
Reference Point Session Dosage Given: 2 Gy
Session Number: 6

## 2024-02-03 ENCOUNTER — Other Ambulatory Visit: Payer: Self-pay

## 2024-02-03 ENCOUNTER — Ambulatory Visit
Admission: RE | Admit: 2024-02-03 | Discharge: 2024-02-03 | Disposition: A | Discharge status: Home / Self Care | Source: Ambulatory Visit | Attending: Radiation Oncology | Admitting: Radiation Oncology

## 2024-02-03 DIAGNOSIS — Z510 Encounter for antineoplastic radiation therapy: Secondary | ICD-10-CM | POA: Diagnosis not present

## 2024-02-03 LAB — RAD ONC ARIA SESSION SUMMARY
Course Elapsed Days: 8
Plan Fractions Treated to Date: 7
Plan Prescribed Dose Per Fraction: 2 Gy
Plan Total Fractions Prescribed: 23
Plan Total Prescribed Dose: 46 Gy
Reference Point Dosage Given to Date: 14 Gy
Reference Point Session Dosage Given: 2 Gy
Session Number: 7

## 2024-02-03 NOTE — Telephone Encounter (Signed)
 Ursala Cressy D, CMA  Joylene Bradley; Garcia, Patricia; Ziegler, Melissa; Tucker, Dolanda; Cain, Timblin New orders have been placed for the above pt, DOB: [birth year] Thanks

## 2024-02-04 ENCOUNTER — Ambulatory Visit
Admission: RE | Admit: 2024-02-04 | Discharge: 2024-02-04 | Disposition: A | Discharge status: Home / Self Care | Source: Ambulatory Visit | Attending: Radiation Oncology

## 2024-02-04 ENCOUNTER — Ambulatory Visit: Admitting: Occupational Therapy

## 2024-02-04 ENCOUNTER — Other Ambulatory Visit: Payer: Self-pay

## 2024-02-04 DIAGNOSIS — M6281 Muscle weakness (generalized): Secondary | ICD-10-CM

## 2024-02-04 DIAGNOSIS — R2681 Unsteadiness on feet: Secondary | ICD-10-CM | POA: Diagnosis not present

## 2024-02-04 DIAGNOSIS — Z510 Encounter for antineoplastic radiation therapy: Secondary | ICD-10-CM | POA: Diagnosis not present

## 2024-02-04 LAB — RAD ONC ARIA SESSION SUMMARY
Course Elapsed Days: 9
Plan Fractions Treated to Date: 8
Plan Prescribed Dose Per Fraction: 2 Gy
Plan Total Fractions Prescribed: 23
Plan Total Prescribed Dose: 46 Gy
Reference Point Dosage Given to Date: 16 Gy
Reference Point Session Dosage Given: 2 Gy
Session Number: 8

## 2024-02-04 NOTE — Therapy (Signed)
 OUTPATIENT OCCUPATIONAL THERAPY NEURO EVALUATION  Patient Name: Alexandra Walker MRN: [id number] DOB:1956/04/12, 68 y.o., female Today's Date: 02/04/2024  PCP: Jodie Lavern LITTIE, MD REFERRING PROVIDER: Izell Domino, MD  END OF SESSION:  OT End of Session - 02/04/24 1525     Visit Number 2    Number of Visits 7    Date for OT Re-Evaluation 03/12/24    Authorization Type [employer] [id number]    OT Start Time 1448    OT Stop Time 1518    OT Time Calculation (min) 30 min           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   Diabetes (HCC)    Diverticulosis    Minimal   Fatty liver    GERD (gastroesophageal reflux disease)    Heart murmur    Hepatic steatosis    History of hiatal hernia 2017   Small   HLA B27 (HLA B27 positive)    Hx of pancreatitis    Hyperlipidemia    Hypertension    IBS (irritable bowel syndrome)    Ischemic colitis (HCC)    Lactose intolerance    Lumbar scoliosis 2010   Mild   Migraine headache without aura 05/10/2015   Multiple thyroid  nodules    Nephrolithiasis 2014   Left, minimal   Obesity due to excess calories with serious comorbidity 05/24/2019   Palpitations    PONV (postoperative nausea and vomiting)    Prediabetes    Presbyopia    Right bundle branch block    Sepsis (HCC) 01/2016   Sleep apnea    Sleep hypopnea    Vitamin D  deficiency    Past Surgical History:  Procedure Laterality Date   APPLICATION OF CRANIAL NAVIGATION N/A 12/29/2023   Procedure: COMPUTER-ASSISTED NAVIGATION, FOR CRANIAL PROCEDURE;  Surgeon: Rosslyn Dino HERO, MD;  Location:  [facility] OR;  Service: Neurosurgery;  Laterality: N/A;   BREAST BIOPSY Left    CERVICAL CONE BIOPSY     CHOLECYSTECTOMY N/A 10/26/2014   Procedure: LAPAROSCOPIC CHOLECYSTECTOMY;  Surgeon: Vicenta Poli, MD;  Location: [facility];  Service: General;  Laterality: N/A;   COLONOSCOPY W/ BIOPSIES  multiple   CRANIOTOMY Right 12/29/2023   Procedure: CRANIOTOMY TUMOR EXCISION;  Surgeon: Rosslyn Dino HERO, MD;  Location: [facility] OR;  Service: Neurosurgery;  Laterality: Right;   CYSTOSCOPY WITH RETROGRADE PYELOGRAM, URETEROSCOPY AND STENT PLACEMENT Left 06/26/2018   Procedure: CYSTOSCOPY WITH RETROGRADE PYELOGRAM, URETEROSCOPY AND STENT PLACEMENT;  Surgeon: Alvaro Hummer, MD;  Location: [facility];  Service: Urology;  Laterality: Left;   DIAGNOSTIC LAPAROSCOPY     DILATION AND CURETTAGE OF UTERUS  11/2009   DUPUYTREN CONTRACTURE RELEASE Left    x 3 , Dr Camella   ESOPHAGOGASTRODUODENOSCOPY  multiple   EYE SURGERY     HOLMIUM LASER APPLICATION Left 06/26/2018   Procedure: HOLMIUM LASER APPLICATION;  Surgeon: Alvaro Hummer, MD;  Location: [facility];  Service: Urology;  Laterality: Left;   KNEE ARTHROSCOPY Left 11/2008   LAPAROSCOPIC VAGINAL HYSTERECTOMY WITH SALPINGO OOPHORECTOMY  Bilateral 10/03/2015   Procedure: LAPAROSCOPIC ASSISTED VAGINAL HYSTERECTOMY WITH SALPINGECTOMY;  Surgeon: Shanda SHAUNNA Muscat, MD;  Location: [facility];  Service: Gynecology;  Laterality: Bilateral;   LASIK  1990   RK   LEFT FINGER SURGERY     LIVER BIOPSY     MASS EXCISION Left 12/24/2012   Procedure: LEFT EXCISION OF PALMAR MASS X TWO;  Surgeon: Elsie Mussel, MD;  Location: [facility];  Service: Orthopedics;  Laterality: Left;   PHOTOREFRACTIVE KERATOTOMY  1994   RADIAL KERATOTOMY  1994   TRANSOBTURATOR SLING     Patient Active Problem List   Diagnosis Date Noted   Cancer of temporal lobe (HCC) 01/09/2024   Glioblastoma, IDH-wildtype (HCC) 12/29/2023    Intracranial mass 12/26/2023   Multiple thyroid  nodules 11/19/2023   Thyroid  nodule 10/07/2023   Osteopenia after menopause 06/13/2023   Sprain of interphalangeal joint of right little finger 06/03/2023   Shifting sleep-work schedule, affecting sleep 10/07/2022   Sleep related bruxism 10/07/2022   Esophageal stricture 09/02/2022   Bilateral lower extremity edema 09/02/2022   Controlled type 2 diabetes mellitus without complication, without long-term current use of insulin  (HCC) 05/27/2022   Morbid obesity (HCC) 08/07/2020   Bilateral hip bursitis 05/06/2019   Pain of right hip joint 03/04/2019   Diverticulosis of colon 05/16/2018   Drug-induced pancreatitis, 2015, thought to be due to [facility] 03/29/2018   Nephrolithiasis 03/29/2018   History of vitreous detachment OU, HTN retinopathy OU, High myopia OU, Cataracts OU, Vitreous floaters OU, Keratoconjunctivitis sicca OU not specified as Sjogren's 03/29/2018   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HCC)    Attention deficit hyperactivity disorder (ADHD), combined type 10/19/2017   Recurrent circadian rhythm sleep disorder, shift work type 03/31/2017   IBS (irritable bowel syndrome), diarrhea predominant 03/17/2017   NAFLD (nonalcoholic fatty liver disease) 94/89/7981   OSA on CPAP, followed by Neurology/Sleep 09/26/2016   History of sepsis 02/01/2016   Hypertension associated with diabetes (HCC) 01/31/2016   Combined hyperlipidemia associated with type 2 diabetes mellitus (HCC) 05/10/2015   Vitamin D  deficiency, on Vit D 2000 IU daily 05/10/2015   Paroxysmal SVT (supraventricular tachycardia) (HCC) 09/19/2011    ONSET DATE: referral 01/09/24  REFERRING DIAG: C71.2 (ICD-10-CM) - Glioblastoma of temporal lobe  THERAPY DIAG:  Muscle weakness (generalized)  Rationale for Evaluation and Treatment: Rehabilitation  SUBJECTIVE:   SUBJECTIVE STATEMENT: Pt reports that she has had 8 of 30 treatments.  Pt reports that she is sleeping pretty good, feels  that she gets winded if she walks too long and just overall fatigue throughout the day.   Pt reports she plans to go to the mountains this weekend. Pt accompanied by: self and daughter remained in waiting room   PERTINENT HISTORY: 12/26/23 underwent an MRI of the brain with contrast which redemonstrated: the posterior right temporal lobe mass highly suspicious for high-grade primary glial neoplasm with avid peripheral enhancement and central necrosis measuring approximately 4.5 x 3.9 cm, with associated dural enhancement extending anteriorly along the right temporal convexity. underwent a right craniotomy for resection of the posterior right temporal lobe mass on 12/29/23 under the care of Dr. Rosslyn. 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.  She reports that she is hyperverbal due to recent steroids.  She has intermittent headaches.    PRECAUTIONS: Other: limit screen time for 2-3 weeks  WEIGHT BEARING RESTRICTIONS: No  PAIN:  Are you having pain? No  FALLS: Has patient fallen in last 6 months? 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  HAND DOMINANCE: Right  ADLs: Overall ADLs: Independent, even showering without supervision Transfers/ambulation related to ADLs: No AD  IADLs: Light housekeeping: Independent Meal Prep: Independent Community mobility: not cleared to  drive, on Keppra  Medication management: daughter assisting, using pill box Financial management: has resumed, daughter assisted initially post hospitalization   MOBILITY STATUS: walks somewhat guarded and uses intermittent support on furniture or wall  POSTURE COMMENTS:  No Significant postural limitations  ACTIVITY TOLERANCE: Activity tolerance: diminished  FUNCTIONAL OUTCOME MEASURES: TBD  UPPER EXTREMITY ROM:    Active ROM Right eval Left eval  Shoulder flexion [facility]  Shoulder abduction [facility]  Shoulder adduction    Shoulder extension    Shoulder internal rotation [facility] WFL  Shoulder external rotation [facility] WFL  Elbow flexion WFL WFL  Elbow extension [facility] WFL  Wrist flexion    Wrist extension    Wrist ulnar deviation    Wrist radial deviation    Wrist pronation    Wrist supination    (Blank rows = not tested)  UPPER EXTREMITY MMT:     MMT Right eval Left eval  Shoulder flexion 4- 4  Shoulder abduction 4- 4-  Shoulder adduction    Shoulder extension    Shoulder internal rotation    Shoulder external rotation    Middle trapezius    Lower trapezius    Elbow flexion 4 4  Elbow extension 4 4  Wrist flexion    Wrist extension    Wrist ulnar deviation    Wrist radial deviation    Wrist pronation    Wrist supination    (Blank rows = not tested)  HAND FUNCTION: Grip strength: Right: 59 lbs; Left: 60 lbs  COORDINATION: 9 Hole Peg test: Right: 26.0 sec; Left: 27.0 sec  SENSATION: WFL  EDEMA: h/o chronic lower extremity edema  COGNITION: Overall cognitive status: Within functional limits for tasks assessed; somewhat hyperverbal but pt attributes to ADD and due to recent steroids.   VISION: Subjective report: blurry on L due to cornea issues Baseline vision: No visual deficits  VISION ASSESSMENT: WFL  OBSERVATIONS: Pleasant pt who is well knowledgeable about her condition as well as potential for deficits to increase with chemo and radiation.  Pt  currently Independent with all ADLs and IADLs with exception of daughter driving and administering medications.  Pt hyperverbal, however attributes to ADD and recent steroids.  Daughter supportive and knowledgeable about importance of continued mobility as well as what to look for as treatment begins.  TREATMENT DATE:  02/04/24 Energy conservation: educated on 4P's of energy conservation (planning, prioritizing, pacing, and positioning) and providing cues and examples for each.  OT encouraging family to drop off at curb, to reduce long distance walking at stores, use of grocery cart in the store for balance and energy conservation.  Reiterated taking extra off of her plate to allow for energy during cancer treatment. Pt reports going to cancer rehab Tues and Fri, using recumbent bike, recumbent elliptical, arm bike, and walking with pulse ox and BP assessments periodically throughout workout.    01/15/24  Engaged in discussion about importance of continued engagement in ADLs and IADLs as activity tolerance permits.  Educated briefly on energy conservation strategies to utilize to allow for engagement in desired ADLs and IADLs as endurance wanes with beginning of chemo and radiation.  OT encouraged pt's daughter and pt to reflect on activity tolerance and if pt requiring increased rest breaks and/or increased touch points when standing or ambulating.   Medication management: OT encouraged pt's daughter to still manage pt's medications due to importance of accuracy with chemo meds but to include pt to ensure that she is aware of what she is taking and when. Encouraged pt to engage in core strengthening with supine bridging with sustained hold for 10-15 seconds as tolerated.  Plan to further address core strengthening and endurance activities.    PATIENT EDUCATION: Education  details: energy conservation Person educated: Patient and Child(ren) Education method: Medical illustrator Education comprehension: verbalized understanding and needs further education  HOME EXERCISE PROGRAM: TBD   GOALS: Goals reviewed with patient? Yes  SHORT TERM GOALS: Target date: 02/13/24  Pt will be independent in core and full body strengthening HEP. Baseline: to be initiated 02/04/24: reports difficulty with core exercises, but is working with cancer rehab 2x/week and they incorporate LB and some core into movements Goal status: in progress  2.  Pt will verbalize understanding of energy conservation strategies and when to utilize during ADLs and IADLs. Baseline: decreased endurance 02/04/24 - educated on today Goal status: in progress  3.  Pt will verbalize understanding of importance of good sleep hygiene/routine. Baseline:  02/04/24: reports much improved sleep. Goal status: MET   LONG TERM GOALS: Target date: 03/12/24  Pt will verbalize understanding of task modifications and/or potential A/E needs to increase ease, safety, and independence w/ ADLs and IADLs Baseline: TBD Goal status: INITIAL  2.  Pt will report ability to engage in 30 mins moderately strenuous activity with 1 or fewer rest breaks (vacuuming, cleaning bathroom, etc) Baseline: decreased endurance  Goal status: INITIAL  3.  Pt wil maintain BUE strength and coordination as needed to engage in ADLs and IADLs. Baseline: 9 Hole Peg test: Right: 26.0 sec; Left: 27.0 sec; Grip strength: R: 59# and L: 60# Goal status: INITIAL  4.  Pt will demonstrate improved BUE strength to 4/5 overall Baseline: 4- Goal status: INITIAL   ASSESSMENT:  CLINICAL IMPRESSION: Patient is a 68 y.o. female who was seen today for occupational therapy treatment for impairments due to glioblastoma of temporal lobe.  Pt reports not changes in coordination or thinking skills.  Pt does report that she is starting to  feel the cumulative effects of her treatment with increased fatigue and overwhelmed by multiple appts.  Pt requesting to put a hold on OT at this time.  Pt scheduling out an appt for ~30 days from now to assess changes.  OT did encourage pt and dtr to call  before that time if any changes occur.  Pt to be followed by occupational therapy PRN to focus on endurance, strengthening, and energy conservation while also being available if pt to develop additional weaknesses during chemo and radiation treatment.    PERFORMANCE DEFICITS: in functional skills including ADLs, IADLs, strength, balance, body mechanics, endurance, decreased knowledge of use of DME, and UE functional use and psychosocial skills including environmental adaptation and routines and behaviors.    PLAN:  OT FREQUENCY: 1x/week  OT DURATION: 8 weeks  PLANNED INTERVENTIONS: 97168 OT Re-evaluation, 97535 self care/ADL training, 02889 therapeutic exercise, 97530 therapeutic activity, 97112 neuromuscular re-education, functional mobility training, psychosocial skills training, energy conservation, coping strategies training, patient/family education, and DME and/or AE instructions  RECOMMENDED OTHER SERVICES: PT  CONSULTED AND AGREED WITH PLAN OF CARE: Patient and family member/caregiver  PLAN FOR NEXT SESSION:  initiate core and UB strengthening exercises, assess coordination and strength as needed   Shaday Rayborn, OTR/L 02/04/2024, 3:26 PM  [facility] at [facility] [address], [address] [place], [place] [address] Phone # [phone] Fax # [phone]

## 2024-02-05 ENCOUNTER — Ambulatory Visit
Admission: RE | Admit: 2024-02-05 | Discharge: 2024-02-05 | Discharge status: Home / Self Care | Source: Ambulatory Visit | Attending: Radiation Oncology

## 2024-02-05 ENCOUNTER — Other Ambulatory Visit: Payer: Self-pay

## 2024-02-05 ENCOUNTER — Encounter: Payer: Self-pay | Admitting: Internal Medicine

## 2024-02-05 DIAGNOSIS — Z510 Encounter for antineoplastic radiation therapy: Secondary | ICD-10-CM | POA: Diagnosis not present

## 2024-02-05 LAB — RAD ONC ARIA SESSION SUMMARY
Course Elapsed Days: 10
Plan Fractions Treated to Date: 9
Plan Prescribed Dose Per Fraction: 2 Gy
Plan Total Fractions Prescribed: 23
Plan Total Prescribed Dose: 46 Gy
Reference Point Dosage Given to Date: 18 Gy
Reference Point Session Dosage Given: 2 Gy
Session Number: 9

## 2024-02-06 ENCOUNTER — Other Ambulatory Visit: Payer: Self-pay

## 2024-02-06 ENCOUNTER — Ambulatory Visit
Admission: RE | Admit: 2024-02-06 | Discharge: 2024-02-06 | Disposition: A | Discharge status: Home / Self Care | Source: Ambulatory Visit | Attending: Radiation Oncology | Admitting: Radiation Oncology

## 2024-02-06 DIAGNOSIS — Z510 Encounter for antineoplastic radiation therapy: Secondary | ICD-10-CM | POA: Diagnosis not present

## 2024-02-06 LAB — RAD ONC ARIA SESSION SUMMARY
Course Elapsed Days: 11
Plan Fractions Treated to Date: 10
Plan Prescribed Dose Per Fraction: 2 Gy
Plan Total Fractions Prescribed: 23
Plan Total Prescribed Dose: 46 Gy
Reference Point Dosage Given to Date: 20 Gy
Reference Point Session Dosage Given: 2 Gy
Session Number: 10

## 2024-02-09 ENCOUNTER — Ambulatory Visit
Admission: RE | Admit: 2024-02-09 | Discharge: 2024-02-09 | Disposition: A | Discharge status: Home / Self Care | Source: Ambulatory Visit | Attending: Radiation Oncology | Admitting: Radiation Oncology

## 2024-02-09 ENCOUNTER — Other Ambulatory Visit: Payer: Self-pay

## 2024-02-09 ENCOUNTER — Inpatient Hospital Stay (HOSPITAL_BASED_OUTPATIENT_CLINIC_OR_DEPARTMENT_OTHER): Admitting: Internal Medicine

## 2024-02-09 ENCOUNTER — Inpatient Hospital Stay

## 2024-02-09 ENCOUNTER — Telehealth: Payer: Self-pay

## 2024-02-09 ENCOUNTER — Other Ambulatory Visit (HOSPITAL_BASED_OUTPATIENT_CLINIC_OR_DEPARTMENT_OTHER): Payer: Self-pay

## 2024-02-09 ENCOUNTER — Other Ambulatory Visit: Payer: Self-pay | Admitting: *Deleted

## 2024-02-09 VITALS — BP 121/71 | HR 63 | Temp 97.4°F | Resp 18 | Wt 261.0 lb

## 2024-02-09 DIAGNOSIS — R300 Dysuria: Secondary | ICD-10-CM

## 2024-02-09 DIAGNOSIS — C719 Malignant neoplasm of brain, unspecified: Secondary | ICD-10-CM

## 2024-02-09 DIAGNOSIS — Z510 Encounter for antineoplastic radiation therapy: Secondary | ICD-10-CM | POA: Diagnosis not present

## 2024-02-09 LAB — URINALYSIS, COMPLETE (UACMP) WITH MICROSCOPIC
Bilirubin Urine: NEGATIVE
Glucose, UA: NEGATIVE mg/dL
Hgb urine dipstick: NEGATIVE
Nitrite: POSITIVE — AB
Protein, ur: NEGATIVE mg/dL
Specific Gravity, Urine: 1.02 (ref 1.005–1.030)
pH: 6 (ref 5.0–8.0)

## 2024-02-09 LAB — RAD ONC ARIA SESSION SUMMARY
Course Elapsed Days: 14
Plan Fractions Treated to Date: 11
Plan Prescribed Dose Per Fraction: 2 Gy
Plan Total Fractions Prescribed: 23
Plan Total Prescribed Dose: 46 Gy
Reference Point Dosage Given to Date: 22 Gy
Reference Point Session Dosage Given: 2 Gy
Session Number: 11

## 2024-02-09 LAB — CMP (CANCER CENTER ONLY)
ALT: 17 U/L (ref 0–44)
AST: 14 U/L — ABNORMAL LOW (ref 15–41)
Albumin: 3.9 g/dL (ref 3.5–5.0)
Alkaline Phosphatase: 94 U/L (ref 38–126)
Anion gap: 4 — ABNORMAL LOW (ref 5–15)
BUN: 16 mg/dL (ref 8–23)
CO2: 28 mmol/L (ref 22–32)
Calcium: 9.1 mg/dL (ref 8.9–10.3)
Chloride: 108 mmol/L (ref 98–111)
Creatinine: 0.79 mg/dL (ref 0.44–1.00)
GFR, Estimated: >60 mL/min (ref >60)
Glucose, Bld: 173 mg/dL — ABNORMAL HIGH (ref 70–99)
Potassium: 4.4 mmol/L (ref 3.5–5.1)
Sodium: 140 mmol/L (ref 135–145)
Total Bilirubin: 0.4 mg/dL (ref 0.0–1.2)
Total Protein: 6.1 g/dL — ABNORMAL LOW (ref 6.5–8.1)

## 2024-02-09 LAB — CBC WITH DIFFERENTIAL (CANCER CENTER ONLY)
Abs Immature Granulocytes: 0.03 K/uL (ref 0.00–0.07)
Basophils Absolute: 0 K/uL (ref 0.0–0.1)
Basophils Relative: 1 %
Eosinophils Absolute: 0.1 K/uL (ref 0.0–0.5)
Eosinophils Relative: 1 %
HCT: 41.3 % (ref 36.0–46.0)
Hemoglobin: 13.2 g/dL (ref 12.0–15.0)
Immature Granulocytes: 1 %
Lymphocytes Relative: 17 %
Lymphs Abs: 0.8 K/uL (ref 0.7–4.0)
MCH: 27.7 pg (ref 26.0–34.0)
MCHC: 32 g/dL (ref 30.0–36.0)
MCV: 86.6 fL (ref 80.0–100.0)
Monocytes Absolute: 0.5 K/uL (ref 0.1–1.0)
Monocytes Relative: 10 %
Neutro Abs: 3.4 K/uL (ref 1.7–7.7)
Neutrophils Relative %: 70 %
Platelet Count: 297 K/uL (ref 150–400)
RBC: 4.77 MIL/uL (ref 3.87–5.11)
RDW: 14.4 % (ref 11.5–15.5)
WBC Count: 4.8 K/uL (ref 4.0–10.5)
nRBC: 0 % (ref 0.0–0.2)

## 2024-02-09 MED ORDER — CIPROFLOXACIN HCL 500 MG PO TABS
500.0000 mg | ORAL_TABLET | Freq: Two times a day (BID) | ORAL | 0 refills | Status: DC
  Filled 2024-02-09: qty 14, 7d supply, fill #0

## 2024-02-09 NOTE — Progress Notes (Signed)
 [facility] [address]  [place], [place] [address] [phone]   Interval Evaluation  Date of Service: 02/09/24 Patient Name: Alexandra Walker Patient MRN: [id number] Patient DOB: 1955/11/02 Provider: Arthea MARLA Manns, MD  Identifying Statement:  Alexandra Walker is a 68 y.o. female with right temporal glioblastoma    Oncologic History: Oncology History  Glioblastoma, IDH-wildtype (HCC)  12/29/2023 Surgery   Right temporal craniotomy, resection with Dr. Rosslyn; path is grade 4 glioma   01/26/2024 -  Chemotherapy   Patient is on Treatment Plan : BRAIN GLIOBLASTOMA Radiation Therapy With Concurrent Temozolomide  75 mg/m2 Daily Followed By Sequential Maintenance Temozolomide  x 6-12 cycles        Interval History: Alexandra Walker presents today for follow up, now having completed 2 weeks of radiation and Temodar .  She is tolerating treatment well so far without any signficant issues.  Remains independent with gait, functional status.  She is weaned off of deacadron at this time.  Does describe some lingering UTI symptoms following course of Keflex  last week.  Denies seizures, headaches.  H+P (01/08/24) Patient presented to neurologic attention with several days history of difficulty driving/navigating, some disorganization at work, and headaches.  CNS imaging demonstrated an enhancing mass within the right temporal lobe, c/w primary brain tumor.  She underwent craniotomy, resection with Dr. Rosslyn on 12/29/23; pathology demonstrated grade 4 glioma, IDH pending.  Since surgery, she feels closer to her prior baseline, independent with gait.  Decadron  is at 4mg  twice per day, which has caused some insomnia and elevated blood sugar.  Still on post-op Keppra .    Medications: Current Outpatient Medications on File Prior to Visit  Medication Sig Dispense Refill   aspirin  325 MG tablet Take 325 mg by mouth daily.     baclofen  (LIORESAL ) 10 MG tablet  Take 1 tablet (10 mg total) by mouth 2 (two) times daily. 30 each 0   butalbital -acetaminophen -caffeine  (FIORICET ) 50-325-40 MG tablet Take 2 tablets by mouth every 4 (four) hours as needed for headache (moderate headache). 60 tablet 0   cephALEXin  (KEFLEX ) 500 MG capsule Take 1 capsule (500 mg total) by mouth 3 (three) times daily. 21 capsule 0   cholecalciferol  (VITAMIN D ) 1000 UNITS tablet Take 2,000 Units by mouth daily.      cyanocobalamin  (VITAMIN B12) 1000 MCG tablet Take 1,000 mcg by mouth daily.     fluticasone  (FLONASE ) 50 MCG/ACT nasal spray Place 1 spray into both nostrils in the morning and at bedtime. 16 g 6   furosemide  (LASIX ) 40 MG tablet Take 1 tablet (40 mg total) by mouth daily. 90 tablet 1   levETIRAcetam  (KEPPRA ) 500 MG tablet Take 1 tablet (500 mg total) by mouth 2 (two) times daily. 180 tablet 3   losartan  (COZAAR ) 100 MG tablet Take 1 tablet (100 mg total) by mouth daily. 90 tablet 3   Lutein 6 MG CAPS Take 6 mg by mouth daily.     metoprolol  succinate (TOPROL -XL) 100 MG 24 hr tablet Take 1 tablet (100 mg total) by mouth daily with or immediately following a meal. 90 tablet 2   Multiple Vitamin (MULTIVITAMIN WITH MINERALS) TABS tablet Take 1 tablet by mouth daily.     ondansetron  (ZOFRAN ) 8 MG tablet Take 1 tablet (8 mg total) by mouth every 8 (eight) hours as needed for nausea or vomiting. May take 30-60 minutes prior to Temodar  administration if nausea/vomiting occurs as needed. 30 tablet 1   potassium  chloride SA (KLOR-CON  M) 20 MEQ tablet Take 1 tablet (20 mEq total) by mouth daily. 90 tablet 1   prochlorperazine  (COMPAZINE ) 10 MG tablet Take 1 tablet (10 mg total) by mouth every 6 (six) hours as needed for nausea or vomiting. 30 tablet 0   rosuvastatin  (CRESTOR ) 20 MG tablet Take 1 tablet (20 mg total) by mouth daily. 90 tablet 3   temozolomide  (TEMODAR ) 180 MG capsule Take 1 capsule (180 mg total) by mouth daily. May take on an empty stomach to decrease nausea &  vomiting. 42 capsule 0   traZODone  (DESYREL ) 50 MG tablet Take 0.5-1 tablets (25-50 mg total) by mouth at bedtime as needed for sleep. 30 tablet 3   No current facility-administered medications on file prior to visit.    Allergies:  Allergies  Allergen Reactions   Ace Inhibitors Other (See Comments)    coughing   Dust Mite Extract Other (See Comments)    Sneezing and congestion   Oxycodone  Hcl Other (See Comments) and Nausea And Vomiting    NDC Rniz:[id number]   Oxycodone -Acetaminophen  Nausea And Vomiting   Tree Extract Other (See Comments)    Tree pollen and scrub pollen    Vicodin [Hydrocodone-Acetaminophen ] Nausea And Vomiting   Victoza [Liraglutide] Other (See Comments)    pancreatitis   Adhesive [Tape] Rash    Not on stomach   Past Medical History:  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   Diabetes (HCC)    Diverticulosis    Minimal   Fatty liver    GERD (gastroesophageal reflux disease)    Heart murmur    Hepatic steatosis    History of hiatal hernia 2017   Small   HLA B27 (HLA B27 positive)    Hx of pancreatitis    Hyperlipidemia    Hypertension    IBS (irritable bowel syndrome)    Ischemic colitis (HCC)    Lactose intolerance    Lumbar scoliosis 2010   Mild   Migraine headache without aura 05/10/2015   Multiple thyroid  nodules    Nephrolithiasis 2014   Left, minimal   Obesity due to excess calories with serious comorbidity 05/24/2019   Palpitations    PONV (postoperative nausea and vomiting)    Prediabetes    Presbyopia    Right bundle branch block     Sepsis (HCC) 01/2016   Sleep apnea    Sleep hypopnea    Vitamin D  deficiency    Past Surgical History:  Past Surgical History:  Procedure Laterality Date   APPLICATION OF CRANIAL NAVIGATION N/A 12/29/2023   Procedure: COMPUTER-ASSISTED NAVIGATION, FOR CRANIAL PROCEDURE;  Surgeon: Rosslyn Dino HERO, MD;  Location: [facility] OR;  Service: Neurosurgery;  Laterality: N/A;   BREAST BIOPSY Left    CERVICAL CONE BIOPSY     CHOLECYSTECTOMY N/A 10/26/2014   Procedure: LAPAROSCOPIC CHOLECYSTECTOMY;  Surgeon: Vicenta Poli, MD;  Location: [facility];  Service: General;  Laterality: N/A;   COLONOSCOPY W/ BIOPSIES  multiple   CRANIOTOMY Right 12/29/2023   Procedure: CRANIOTOMY TUMOR EXCISION;  Surgeon:  Rosslyn Dino HERO, MD;  Location: [facility];  Service: Neurosurgery;  Laterality: Right;   CYSTOSCOPY WITH RETROGRADE PYELOGRAM, URETEROSCOPY AND STENT PLACEMENT Left 06/26/2018   Procedure: CYSTOSCOPY WITH RETROGRADE PYELOGRAM, URETEROSCOPY AND STENT PLACEMENT;  Surgeon: Alvaro Hummer, MD;  Location: [facility];  Service: Urology;  Laterality: Left;   DIAGNOSTIC LAPAROSCOPY     DILATION AND CURETTAGE OF UTERUS  11/2009   DUPUYTREN CONTRACTURE RELEASE Left    x 3 , Dr Camella   ESOPHAGOGASTRODUODENOSCOPY  multiple   EYE SURGERY     HOLMIUM LASER APPLICATION Left 06/26/2018   Procedure: HOLMIUM LASER APPLICATION;  Surgeon: Alvaro Hummer, MD;  Location: [facility];  Service: Urology;  Laterality: Left;   KNEE ARTHROSCOPY Left 11/2008   LAPAROSCOPIC VAGINAL HYSTERECTOMY WITH SALPINGO OOPHORECTOMY Bilateral 10/03/2015   Procedure: LAPAROSCOPIC ASSISTED VAGINAL HYSTERECTOMY WITH SALPINGECTOMY;  Surgeon: Shanda SHAUNNA Muscat, MD;  Location: [facility];  Service: Gynecology;  Laterality: Bilateral;   LASIK  1990   RK   LEFT FINGER SURGERY     LIVER BIOPSY     MASS EXCISION Left 12/24/2012   Procedure: LEFT EXCISION OF PALMAR MASS X TWO;  Surgeon: Elsie Camella, MD;   Location: [facility];  Service: Orthopedics;  Laterality: Left;   PHOTOREFRACTIVE KERATOTOMY  1994   RADIAL KERATOTOMY  1994   TRANSOBTURATOR SLING     Social History:  Social History   Socioeconomic History   Marital status: Divorced    Spouse name: Not on file   Number of children: 1   Years of education: Not on file   Highest education level: Master's degree (e.g., MA, MS, MEng, MEd, MSW, MBA)  Occupational History   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   Social Drivers of Health   Financial Resource Strain: Low Risk  (06/13/2023)   Overall Financial Resource Strain (CARDIA)    Difficulty of Paying Living Expenses: Not hard at all  Food Insecurity: No Food Insecurity (01/09/2024)   Hunger Vital Sign    Worried About Running Out of Food in the Last Year: Never true    Ran Out of Food in the Last Year: Never true  Transportation Needs: No Transportation Needs (01/09/2024)   PRAPARE - Administrator, Civil Service (Medical): No    Lack of Transportation (Non-Medical): No  Physical Activity: Insufficiently Active (06/13/2023)   Exercise Vital Sign    Days of Exercise per Week: 1 day    Minutes of Exercise per Session: 30 min  Stress: No Stress Concern Present (06/13/2023)   [employer] of Occupational Health - Occupational Stress Questionnaire    Feeling of Stress : Only a little  Social Connections: Moderately Isolated (12/27/2023)   Social Connection and Isolation Panel    Frequency of Communication with  Friends and Family: More than three times a week    Frequency of Social Gatherings with Friends and Family: Once a week    Attends Religious  Services: More than 4 times per year    Active Member of Clubs or Organizations: No    Attends Banker Meetings: Never    Marital Status: Divorced  Catering manager Violence: Not At Risk (01/09/2024)   Humiliation, Afraid, Rape, and Kick questionnaire    Fear of Current or Ex-Partner: No    Emotionally Abused: No    Physically Abused: No    Sexually Abused: No   Family History:  Family History  Problem Relation Age of Onset   Coronary artery disease Mother    Hypertension Mother    Heart disease Mother    Heart attack Mother    Diabetes Mother    Hyperlipidemia Mother    Kidney disease Mother    Depression Mother    Sleep apnea Mother    Obesity Mother    Heart attack Father    Heart disease Father    Sudden death Father    Hypertension Sister    Hypertension Brother    Colon cancer Neg Hx    Colon polyps Neg Hx    Esophageal cancer Neg Hx    Rectal cancer Neg Hx    Stomach cancer Neg Hx     Review of Systems: Constitutional: Doesn't report fevers, chills or abnormal weight loss Eyes: Doesn't report blurriness of vision Ears, nose, mouth, throat, and face: Doesn't report sore throat Respiratory: Doesn't report cough, dyspnea or wheezes Cardiovascular: Doesn't report palpitation, chest discomfort  Gastrointestinal:  Doesn't report nausea, constipation, diarrhea GU: Doesn't report incontinence Skin: Doesn't report skin rashes Neurological: Per HPI Musculoskeletal: Doesn't report joint pain Behavioral/Psych: Doesn't report anxiety  Physical Exam: Vitals:   02/09/24 0958  BP: 121/71  Pulse: 63  Resp: 18  Temp: (!) 97.4 F (36.3 C)  SpO2: 98%    KPS: 90. General: Alert, cooperative, pleasant, in no acute distress Head: Normal EENT: No conjunctival injection or scleral icterus.  Lungs: Resp effort  normal Cardiac: Regular rate Abdomen: Non-distended abdomen Skin: No rashes cyanosis or petechiae. Extremities: No clubbing or edema  Neurologic Exam: Mental Status: Awake, alert, attentive to examiner. Oriented to self and environment. Language is fluent with intact comprehension.  Cranial Nerves: Visual acuity is grossly normal. Visual fields are full. Extra-ocular movements intact. No ptosis. Face is symmetric Motor: Tone and bulk are normal. Power is full in both arms and legs. Reflexes are symmetric, no pathologic reflexes present.  Sensory: Intact to light touch Gait: Normal.   Labs: I have reviewed the data as listed    Component Value Date/Time   NA 140 02/09/2024 0831   NA 139 11/26/2023 1155   K 4.4 02/09/2024 0831   CL 108 02/09/2024 0831   CO2 28 02/09/2024 0831   GLUCOSE 173 (H) 02/09/2024 0831   BUN 16 02/09/2024 0831   BUN 16 11/26/2023 1155   CREATININE 0.79 02/09/2024 0831   CREATININE 0.76 05/05/2020 1631   CALCIUM  9.1 02/09/2024 0831   PROT 6.1 (L) 02/09/2024 0831   PROT 6.3 09/03/2021 1219   ALBUMIN 3.9 02/09/2024 0831   ALBUMIN 4.1 09/03/2021 1219   AST 14 (L) 02/09/2024 0831   ALT 17 02/09/2024 0831   ALKPHOS 94 02/09/2024 0831   BILITOT 0.4 02/09/2024 0831   GFRNONAA >60 02/09/2024 0831   GFRAA >60 04/20/2018 1323   Lab Results  Component Value Date   WBC 4.8 02/09/2024   NEUTROABS 3.4 02/09/2024   HGB 13.2 02/09/2024   HCT 41.3 02/09/2024   MCV 86.6 02/09/2024  PLT 297 02/09/2024      Assessment/Plan Glioblastoma, IDH-wildtype (HCC)  Alexandra Walker is clinically stable today, now having completed 2 weeks of IMRT and concurrent Temodar .  Labs are within normal limits today.  We ultimately recommended continuing with course of intensity modulated radiation therapy and concurrent daily Temozolomide .  Radiation will be administered Mon-Fri over 6 weeks, Temodar  will be dosed at 75mg /m2 to be given daily over 42 days.  We reviewed side  effects of temodar , including fatigue, nausea/vomiting, constipation, and cytopenias.  Chemotherapy should be held for the following:  ANC less than 1,000  Platelets less than 100,000  LFT or creatinine greater than 2x ULN  If clinical concerns/contraindications develop  Should remain off decadron  if tolerated.  Urine was positive for urethritis, likely recurrence of E. Coli cultured earlier.  Sent in script for ciprofloxacin  500mg  BID x7 days.  Every 2 weeks during radiation, labs will be checked accompanied by a clinical evaluation in the brain tumor clinic.  All questions were answered. The patient knows to call the clinic with any problems, questions or concerns. No barriers to learning were detected.  The total time spent in the encounter was 30 minutes and more than 50% was on counseling and review of test results   Arthea MARLA Manns, MD Medical Director of Neuro-Oncology [facility] [place] 02/09/24 9:29 AM

## 2024-02-09 NOTE — Telephone Encounter (Signed)
 Notified the patient, but was able to talk to her daughter regarding her FMLA/Disability forms  Were completed, faxed,and confirmation received. Pt hard copy was via emailed and also mailed to he home. No questions or concerns to be noted.

## 2024-02-10 ENCOUNTER — Other Ambulatory Visit: Payer: Self-pay

## 2024-02-10 ENCOUNTER — Telehealth: Payer: Self-pay

## 2024-02-10 ENCOUNTER — Ambulatory Visit: Admitting: Occupational Therapy

## 2024-02-10 ENCOUNTER — Ambulatory Visit
Admission: RE | Admit: 2024-02-10 | Discharge: 2024-02-10 | Disposition: A | Discharge status: Home / Self Care | Source: Ambulatory Visit | Attending: Radiation Oncology

## 2024-02-10 ENCOUNTER — Encounter: Payer: Self-pay | Admitting: Physical Therapy

## 2024-02-10 ENCOUNTER — Ambulatory Visit: Admitting: Physical Therapy

## 2024-02-10 ENCOUNTER — Ambulatory Visit: Admitting: "Endocrinology

## 2024-02-10 DIAGNOSIS — M5459 Other low back pain: Secondary | ICD-10-CM | POA: Diagnosis not present

## 2024-02-10 DIAGNOSIS — Z510 Encounter for antineoplastic radiation therapy: Secondary | ICD-10-CM | POA: Diagnosis not present

## 2024-02-10 DIAGNOSIS — R42 Dizziness and giddiness: Secondary | ICD-10-CM | POA: Diagnosis not present

## 2024-02-10 DIAGNOSIS — R2681 Unsteadiness on feet: Secondary | ICD-10-CM | POA: Diagnosis not present

## 2024-02-10 DIAGNOSIS — M6281 Muscle weakness (generalized): Secondary | ICD-10-CM | POA: Diagnosis not present

## 2024-02-10 DIAGNOSIS — C712 Malignant neoplasm of temporal lobe: Secondary | ICD-10-CM | POA: Diagnosis not present

## 2024-02-10 LAB — RAD ONC ARIA SESSION SUMMARY
Course Elapsed Days: 15
Plan Fractions Treated to Date: 12
Plan Prescribed Dose Per Fraction: 2 Gy
Plan Total Fractions Prescribed: 23
Plan Total Prescribed Dose: 46 Gy
Reference Point Dosage Given to Date: 24 Gy
Reference Point Session Dosage Given: 2 Gy
Session Number: 12

## 2024-02-10 NOTE — Therapy (Signed)
 OUTPATIENT PHYSICAL THERAPY NEURO TREATMENT NOTE   Patient Name: Alexandra Walker MRN: [id number] DOB:1956/02/04, 68 y.o., female Today's Date: 02/10/2024   PCP:   Jodie Lavern LITTIE, MD   REFERRING PROVIDER: Rosslyn Dino HERO, MD  END OF SESSION:  PT End of Session - 02/10/24 1229     Visit Number 2    Number of Visits 5    Date for Recertification  03/03/24    Authorization Type [employer]    PT Start Time 1232    PT Stop Time 1314    PT Time Calculation (min) 42 min    Equipment Utilized During Treatment --    Activity Tolerance Patient tolerated treatment well    Behavior During Therapy [facility] for tasks assessed/performed           Past Medical History:  Diagnosis Date   ADD (attention deficit disorder)    Allergy    Anticardiolipin syndrome    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   lasted about 6 hours, Echo did not show anything   Bilateral swelling of feet    Cholelithiasis    Chronic depressive disorder    Chronic diastolic CHF (congestive heart failure) (HCC)    Circadian rhythm sleep disorder, shift work    DDD (degenerative disc disease), 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   Diabetes (HCC)    Diverticulosis    Minimal   Fatty liver    GERD (gastroesophageal reflux disease)    Heart murmur    Hepatic steatosis    History of hiatal hernia 2017   Small   HLA B27 (HLA B27 positive)    Hx of pancreatitis    Hyperlipidemia    Hypertension    IBS (irritable bowel syndrome)    Ischemic colitis    Lactose intolerance    Lumbar scoliosis 2010   Mild   Migraine headache without aura 05/10/2015   Multiple thyroid  nodules    Nephrolithiasis 2014   Left, minimal   Obesity due to excess calories with serious comorbidity 05/24/2019   Palpitations    PONV (postoperative nausea and vomiting)    Prediabetes    Presbyopia    Right bundle branch block    Sepsis (HCC) 01/2016   Sleep apnea    Sleep hypopnea    Vitamin D  deficiency    Past Surgical History:  Procedure Laterality Date    APPLICATION OF CRANIAL NAVIGATION N/A 12/29/2023   Procedure: COMPUTER-ASSISTED NAVIGATION, FOR CRANIAL PROCEDURE;  Surgeon: Rosslyn Dino HERO, MD;  Location: [facility] OR;  Service: Neurosurgery;  Laterality: N/A;   BREAST BIOPSY Left    CERVICAL CONE BIOPSY     CHOLECYSTECTOMY N/A 10/26/2014   Procedure: LAPAROSCOPIC CHOLECYSTECTOMY;  Surgeon: Vicenta Poli, MD;  Location: [facility];  Service: General;  Laterality: N/A;   COLONOSCOPY W/ BIOPSIES  multiple   CRANIOTOMY Right 12/29/2023   Procedure: CRANIOTOMY TUMOR EXCISION;  Surgeon: Rosslyn Dino HERO, MD;  Location: [facility] OR;  Service: Neurosurgery;  Laterality: Right;   CYSTOSCOPY WITH RETROGRADE PYELOGRAM, URETEROSCOPY AND STENT PLACEMENT Left 06/26/2018   Procedure: CYSTOSCOPY WITH RETROGRADE PYELOGRAM, URETEROSCOPY AND STENT PLACEMENT;  Surgeon: Alvaro Hummer, MD;  Location: [facility];  Service: Urology;  Laterality: Left;   DIAGNOSTIC LAPAROSCOPY     DILATION AND CURETTAGE OF UTERUS  11/2009   DUPUYTREN CONTRACTURE RELEASE Left    x 3 , Dr Camella   ESOPHAGOGASTRODUODENOSCOPY  multiple   EYE SURGERY     HOLMIUM LASER APPLICATION Left 06/26/2018   Procedure: HOLMIUM LASER  APPLICATION;  Surgeon: Alvaro Hummer, MD;  Location: [facility];  Service: Urology;  Laterality: Left;   KNEE ARTHROSCOPY Left 11/2008   LAPAROSCOPIC VAGINAL HYSTERECTOMY WITH SALPINGO OOPHORECTOMY Bilateral 10/03/2015   Procedure: LAPAROSCOPIC ASSISTED VAGINAL HYSTERECTOMY WITH SALPINGECTOMY;  Surgeon: Shanda SHAUNNA Muscat, MD;  Location: [facility];  Service: Gynecology;  Laterality: Bilateral;   LASIK  1990   RK   LEFT FINGER SURGERY     LIVER BIOPSY     MASS EXCISION Left 12/24/2012   Procedure: LEFT EXCISION OF PALMAR MASS X TWO;  Surgeon: Elsie Mussel, MD;  Location: [facility];  Service: Orthopedics;  Laterality: Left;   PHOTOREFRACTIVE KERATOTOMY  1994   RADIAL KERATOTOMY  1994   TRANSOBTURATOR SLING      Patient Active Problem List   Diagnosis Date Noted   Cancer of temporal lobe (HCC) 01/09/2024   Glioblastoma, IDH-wildtype (HCC) 12/29/2023   Intracranial mass 12/26/2023   Multiple thyroid  nodules 11/19/2023   Thyroid  nodule 10/07/2023   Osteopenia after menopause 06/13/2023   Sprain of interphalangeal joint of right little finger 06/03/2023   Shifting sleep-work schedule, affecting sleep 10/07/2022   Sleep related bruxism 10/07/2022   Esophageal stricture 09/02/2022   Bilateral lower extremity edema 09/02/2022   Controlled type 2 diabetes mellitus without complication, without long-term current use of insulin  (HCC) 05/27/2022   Morbid obesity (HCC) 08/07/2020   Bilateral hip bursitis 05/06/2019   Pain of right hip joint 03/04/2019   Diverticulosis of colon 05/16/2018   Drug-induced pancreatitis, 2015, thought to be due to [facility] 03/29/2018   Nephrolithiasis 03/29/2018   History of vitreous detachment OU, HTN retinopathy OU, High myopia OU, Cataracts OU, Vitreous floaters OU, Keratoconjunctivitis sicca OU not specified as Sjogren's 03/29/2018   Anticardiolipin syndrome    Attention deficit hyperactivity disorder (ADHD), combined type 10/19/2017   Recurrent circadian rhythm sleep disorder, shift work type 03/31/2017   IBS (irritable bowel syndrome), diarrhea predominant 03/17/2017   NAFLD (nonalcoholic fatty liver disease) 94/89/7981   OSA on CPAP, followed by Neurology/Sleep 09/26/2016   History of sepsis 02/01/2016   Hypertension associated with diabetes (HCC) 01/31/2016   Combined hyperlipidemia associated with type 2 diabetes mellitus (HCC) 05/10/2015   Vitamin D  deficiency, on Vit D 2000 IU daily 05/10/2015   Paroxysmal SVT (supraventricular tachycardia) 09/19/2011    ONSET DATE: 12/29/23  REFERRING DIAG: D49.6 (ICD-10-CM) - Brain tumor (HCC)  THERAPY DIAG:  Muscle weakness (generalized)  Unsteadiness on feet  Dizziness and giddiness  Rationale for Evaluation  and Treatment: Rehabilitation  SUBJECTIVE:  SUBJECTIVE STATEMENT: Just very tired, already had treatment today.  Have program called [facility], in [facility], and will try to do that 2x/wk.  Did the orientation already.  Turning too fast makes me feel off balance and then I definitely know I have core weaknes.    Pt accompanied by: self  PERTINENT HISTORY: ADD, anticardiolipin syndrome, depression, CHF, DM, hiatal hernia, HLD, HTN, migraine Patient is taking chemo pills daily and radiation M-F for 6 weeks (eval week was 1st week).   PAIN:  Are you having pain? Yes: NPRS scale: resolved Pain location: mild R low back, headache Pain description: headache and cramp  Aggravating factors: walking without heel lift Relieving factors: massage, change of meds  PRECAUTIONS: Fall  RED FLAGS: None   WEIGHT BEARING RESTRICTIONS: No  FALLS: Has patient fallen in last 6 months? Yes. 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, and Leisure: pt reports that she has a treadmill and enjoys walking  PATIENT GOALS: improve balance and strength   OBJECTIVE:   TODAY'S TREATMENT: 02/10/2024 Activity Comments  Standing corner balance exercises: Feet apart EO head turns/nods   1-2/10 symptoms dizziness with vertical head motions, settles quickly  180 degree turns in doorway R and L, then turn head to target ahead of turn      Seated pelvic tilt-review of previously given exercise as part of  HEP   Cues for finding neutral spine and abdominal activation       HOME EXERCISE PROGRAM: Access Code: [email or web address] URL: [email or web address] Date: 02/10/2024 Prepared by: [facility] - Outpatient  Rehab - [facility]  Exercises - Seated Flexion Stretch with Swiss Ball  - 1 x daily - 5 x weekly - 2 sets - 10 reps - 5 sec hold - Seated Thoracic Flexion and Rotation with Swiss Ball  - 1 x daily - 5 x weekly - 2 sets - 10 reps - 5 sec hold - Seated Pelvic Tilt  - 1 x daily - 5 x weekly - 2 sets - 10 reps - Standing Balance in Corner  - 1 x daily - 7 x weekly - 2 sets - 5 reps - Corner Balance Feet Together With Eyes Open  - 1 x daily - 7 x weekly - 2 sets - 5 reps - 180 Degree Pivot Turn with Single Point Cane  - 1-2 x daily - 7 x weekly - 1-2 sets - 5 reps  M-CTSIB  Condition 1: Firm Surface, EO 30 Sec, Normal Sway  Condition 2: Firm Surface, EC 27 Sec, Moderate Sway  Condition 3: Foam Surface, EO 30 Sec, Normal Sway  Condition 4: Foam Surface, EC 6.56 Sec, Severe Sway    PATIENT EDUCATION: Education details: rationale for vestibular/multi-sensory balance exercises and attempts at habituation of movement exercises; answered pt's questions, including trying to change to either [facility] or [facility] due to to proximity to her home Person educated: Patient Education method: Explanation, Demonstration, and Handouts Education comprehension: verbalized understanding, returned demonstration, and needs further education  ------------------------------------- Note: Objective measures were completed at Evaluation unless otherwise noted.  DIAGNOSTIC FINDINGS: 12/26/23 underwent an MRI of the brain with contrast which redemonstrated: the posterior right temporal lobe  mass highly suspicious for high-grade primary glial neoplasm with avid peripheral enhancement and central necrosis measuring approximately 4.5 x 3.9 cm, with associated dural enhancement extending anteriorly  along the right temporal convexity. underwent a right craniotomy for resection of the posterior right temporal lobe mass on 12/29/23 under the care of Dr. Rosslyn. 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  COGNITION: Overall cognitive status: Within functional limits for tasks assessed   SENSATION: Pt denies N/T in UEs/LEs   COORDINATION: Alternating pronation/supination: slightly slowed L Alternating toe tap: WNL B Finger to nose: slightly slowed L   MUSCLE TONE: WNL B LEs   PALPATION: c/o pain in R PSIS, TTP over R QL and lumbar paraspinals   POSTURE: posterior lean in sitting   LOWER EXTREMITY ROM:     Active  Right Eval Left Eval  Hip flexion    Hip extension    Hip abduction    Hip adduction    Hip internal rotation    Hip external rotation    Knee flexion    Knee extension    Ankle dorsiflexion 18 15  Ankle plantarflexion    Ankle inversion    Ankle eversion     (Blank rows = not tested)  LOWER EXTREMITY MMT:    MMT Right Eval Left Eval  Hip flexion 4 4  Hip extension    Hip abduction 4 4  Hip adduction 4 4  Hip internal rotation    Hip external rotation    Knee flexion 4 4  Knee extension 4 4  Ankle dorsiflexion 4+ 4+  Ankle plantarflexion 4+ 4+  Ankle inversion    Ankle eversion    (Blank rows = not tested)  GAIT: Findings: Assistive device utilized:None, Level of assistance: Modified independence, and Comments: slowed gait     [facility] PT Assessment - 01/28/24 0001       Standardized Balance Assessment   Standardized Balance Assessment 10 meter walk test    10 Meter Walk 12.7 sec (2.58 ftsec)     Functional Gait  Assessment   Gait assessed  Yes    Gait Level Surface Walks 20 ft in less than 7 sec but greater than 5.5 sec, uses assistive device, slower speed, mild gait deviations, or deviates 6-10 in outside of the 12 in walkway width.    Change in Gait Speed Able to smoothly change walking speed without loss of  balance or gait deviation. Deviate no more than 6 in outside of the 12 in walkway width.    Gait with Horizontal Head Turns Performs head turns with moderate changes in gait velocity, slows down, deviates 10-15 in outside 12 in walkway width but recovers, can continue to walk.    Gait with Vertical Head Turns Performs head turns with no change in gait. Deviates no more than 6 in outside 12 in walkway width.    Gait and Pivot Turn Pivot turns safely in greater than 3 sec and stops with no loss of balance, or pivot turns safely within 3 sec and stops with mild imbalance, requires small steps to catch balance.    Step Over Obstacle Is able to step over 2 stacked shoe boxes taped together (9 in total height) without changing gait speed. No evidence of imbalance.    Gait with Narrow Base of Support Ambulates less than 4 steps heel to toe or cannot perform without assistance.    Gait with Eyes Closed Walks 20 ft, uses assistive device, slower speed,  mild gait deviations, deviates 6-10 in outside 12 in walkway width. Ambulates 20 ft in less than 9 sec but greater than 7 sec.    Ambulating Backwards Walks 20 ft, slow speed, abnormal gait pattern, evidence for imbalance, deviates 10-15 in outside 12 in walkway width.    Steps Alternating feet, must use rail.    Total Score 19         5xSTS: 18.06 sec with armrests occasionally                                                                                                                                TREATMENT DATE: 01/28/24    PATIENT EDUCATION: Education details: prognosis, POC, HEP, exam findings as they relate to ADLs Person educated: Patient Education method: Explanation, Demonstration, Tactile cues, Verbal cues, and Handouts Education comprehension: verbalized understanding and returned demonstration  HOME EXERCISE PROGRAM: Access Code: [email or web address] URL: [email or web address] Date: 01/28/2024 Prepared by: [facility] - Outpatient  Rehab  - [facility]  Exercises - Seated Flexion Stretch with Swiss Ball  - 1 x daily - 5 x weekly - 2 sets - 10 reps - 5 sec hold - Seated Thoracic Flexion and Rotation with Swiss Ball  - 1 x daily - 5 x weekly - 2 sets - 10 reps - 5 sec hold - Seated Pelvic Tilt  - 1 x daily - 5 x weekly - 2 sets - 10 reps  GOALS: Goals reviewed with patient? Yes  SHORT TERM GOALS: Target date: 02/11/2024  Patient to be independent with initial HEP. Baseline: HEP initiated Goal status: IN PROGRESS    LONG TERM GOALS: Target date: 03/03/2024  Patient to be independent with advanced HEP. Baseline: Not yet initiated  Goal status: IN PROGRESS  Patient to demonstrate B LE strength >/=4+/5.  Baseline: See above Goal status: IN PROGRESS  Patient to demonstrate gait speed of at least 3.0 ft/sec in order to improve community access.  Baseline: 2.74ft/sec Goal status: IN PROGRESS  Patient to score at least 23/30 on FGA in order to decrease risk of falls.  Baseline: 19 Goal status: IN PROGRESS  Patient to demonstrate 5xSTS test in <15 sec in order to decrease risk of falls.  Baseline: 18 sec Goal status: IN PROGRESS  Patient to return to walking program for at least 15 minutes with LRAD for safety and without fatigue limiting.  Baseline: reports walking outside with walking stick for unspecified amount of time  Goal status: IN PROGRESS   ASSESSMENT:  CLINICAL IMPRESSION: Pt presents today and reports she is experiencing some fatigue due to having radiation already today.  She does note she has begun an exercise-physiologist-led class in [place] through their health system for strengthening/endurance specifically designed for people going through cancer treatment. She also requests a clinic for OPPT closer to her home (likely [place]).  Skilled PT session focused on review of and progression of HEP.  Worked on vestibular system/multi-sensory  balance training, as with Conditions 2 and 4 on  MCTSIB, pt has increased balance difficulty.  Also worked to address habituation with turning exercises.  She tolerates well and was provided updates to HEP.  She will continue to benefit from skilled PT towards goals for improved functional mobility and decreased fall risk.   FROM EVAL:  Patient is a 68 y/o F presenting to OPPT with c/o HA, imbalance, weakness, and swimmyheadedness s/p right craniotomy for resection of posterior right temporal lobe mass on 12/29/23. Patient today presenting with slowed coordination testing in L UE, TTP over R QL, posterior lean in sitting, B hip and knee weakness, slow gait speed, decreased transfer speed and imbalance. Patient's score on FGA indicates an increased risk of falls. Patient was educated on gentle stretching HEP and reported understanding. Prior to current episode, patient was independent. Would benefit from skilled PT services 1 x/week for 4 weeks to address aforementioned impairments in order to optimize level of function.    OBJECTIVE IMPAIRMENTS: Abnormal gait, decreased activity tolerance, decreased balance, decreased coordination, decreased endurance, decreased strength, dizziness, increased muscle spasms, postural dysfunction, and pain.   ACTIVITY LIMITATIONS: carrying, lifting, bending, standing, squatting, stairs, transfers, bed mobility, bathing, toileting, dressing, reach over head, hygiene/grooming, and locomotion level  PARTICIPATION LIMITATIONS: meal prep, laundry, shopping, community activity, and church  PERSONAL FACTORS: Age, Past/current experiences, Time since onset of injury/illness/exacerbation, and 3+ comorbidities: ADD, anticardiolipin syndrome, depression, CHF, DM, hiatal hernia, HLD, HTN, migraine are also affecting patient's functional outcome.   REHAB POTENTIAL: Good  CLINICAL DECISION MAKING: Evolving/moderate complexity  EVALUATION COMPLEXITY: Moderate  PLAN:  PT FREQUENCY: 1x/week  PT DURATION: 4 weeks  PLANNED  INTERVENTIONS: 97164- PT Re-evaluation, 97750- Physical Performance Testing, 97110-Therapeutic exercises, 97530- Therapeutic activity, 97112- Neuromuscular re-education, 97535- Self Care, [id number]- Manual therapy, [phone]- Gait training, [phone]- Canalith repositioning, [id number]- Aquatic Therapy, [phone]- Electrical stimulation (unattended), [phone] (1-2 muscles), [phone] (3+ muscles)- Dry Needling, Patient/Family education, Balance training, Stair training, Taping, Vestibular training, Cryotherapy, and Moist heat  PLAN FOR NEXT SESSION: review and continue to develop HEP for core stability, hip/knee strengthening (the strengthening may be getting incorporated from the [place] program) and balance; may incorporate vestibular challenges or habituation into balance training to address swimmyheadedness   Greig Anon, PT 02/10/24 2:22 PM Phone: [phone] Fax: [phone]  [facility] at [facility] [address], [address] [place], [place] [address] Phone # [phone] Fax # [phone]

## 2024-02-10 NOTE — Telephone Encounter (Signed)
 Notified the daughter this morning regarding her moms FMLA/Disability. She stated that she will talk with her mother and explain her forms with her. No questions or concerns to be noted at this time.

## 2024-02-10 NOTE — Progress Notes (Signed)
 Specialty Pharmacy Refill Coordination Note  Alexandra Walker is a 68 y.o. female contacted today regarding refills of specialty medication(s) Temozolomide  (TEMODAR )   Patient requested Delivery   Delivery date: 02/17/24   Verified address: [address]   [place] [address]   Medication will be filled on 02/16/24.

## 2024-02-10 NOTE — Progress Notes (Signed)
 Specialty Pharmacy Ongoing Clinical Assessment Note  Alexandra Walker is a 68 y.o. female who is being followed by the specialty pharmacy service for RxSp Oncology   Patient's specialty medication(s) reviewed today: Temozolomide  (TEMODAR )   Missed doses in the last 4 weeks: 0   Patient/Caregiver did not have any additional questions or concerns.   Therapeutic benefit summary: Patient is achieving benefit   Adverse events/side effects summary: Experienced adverse events/side effects (fatigue, weakness, patient reports radiation is also contributing to her fatigue and both are tolerable at this time)   Patient's therapy is appropriate to: Continue    Goals Addressed             This Visit's Progress    Maintain optimal adherence to therapy   On track    Patient is initiating therapy. Patient will maintain adherence         Follow up: 3 months  Silvano LOISE Dolly Specialty Pharmacist   Clinical Intervention Note  Clinical Intervention Notes: Patient started on ciprofloxacin  for a UTI, no DDIs were identified, but I recommended that she start a probiotic and continue a probiotic for 7 days after her ciprofloxacin  is complete to minimize the chance of GI side effects.   Clinical Intervention Outcomes: Prevention of an adverse drug event   Silvano LOISE Dolly Karel Santa

## 2024-02-11 ENCOUNTER — Ambulatory Visit
Admission: RE | Admit: 2024-02-11 | Discharge: 2024-02-11 | Disposition: A | Discharge status: Home / Self Care | Source: Ambulatory Visit | Attending: Radiation Oncology | Admitting: Radiation Oncology

## 2024-02-11 ENCOUNTER — Other Ambulatory Visit: Payer: Self-pay

## 2024-02-11 DIAGNOSIS — Z510 Encounter for antineoplastic radiation therapy: Secondary | ICD-10-CM | POA: Diagnosis not present

## 2024-02-11 DIAGNOSIS — C712 Malignant neoplasm of temporal lobe: Secondary | ICD-10-CM | POA: Diagnosis not present

## 2024-02-11 LAB — URINE CULTURE: Culture: 60000 — AB

## 2024-02-11 LAB — RAD ONC ARIA SESSION SUMMARY
Course Elapsed Days: 16
Plan Fractions Treated to Date: 13
Plan Prescribed Dose Per Fraction: 2 Gy
Plan Total Fractions Prescribed: 23
Plan Total Prescribed Dose: 46 Gy
Reference Point Dosage Given to Date: 26 Gy
Reference Point Session Dosage Given: 2 Gy
Session Number: 13

## 2024-02-12 ENCOUNTER — Encounter: Payer: Self-pay | Admitting: Internal Medicine

## 2024-02-12 ENCOUNTER — Ambulatory Visit
Admission: RE | Admit: 2024-02-12 | Discharge: 2024-02-12 | Disposition: A | Discharge status: Home / Self Care | Source: Ambulatory Visit | Attending: Radiation Oncology | Admitting: Radiation Oncology

## 2024-02-12 ENCOUNTER — Other Ambulatory Visit: Payer: Self-pay

## 2024-02-12 ENCOUNTER — Encounter: Payer: Self-pay | Admitting: Family Medicine

## 2024-02-12 DIAGNOSIS — Z510 Encounter for antineoplastic radiation therapy: Secondary | ICD-10-CM | POA: Diagnosis not present

## 2024-02-12 LAB — RAD ONC ARIA SESSION SUMMARY
Course Elapsed Days: 17
Plan Fractions Treated to Date: 14
Plan Prescribed Dose Per Fraction: 2 Gy
Plan Total Fractions Prescribed: 23
Plan Total Prescribed Dose: 46 Gy
Reference Point Dosage Given to Date: 28 Gy
Reference Point Session Dosage Given: 2 Gy
Session Number: 14

## 2024-02-13 ENCOUNTER — Other Ambulatory Visit: Payer: Self-pay

## 2024-02-13 ENCOUNTER — Ambulatory Visit
Admission: RE | Admit: 2024-02-13 | Discharge: 2024-02-13 | Disposition: A | Discharge status: Home / Self Care | Source: Ambulatory Visit | Attending: Radiation Oncology | Admitting: Radiation Oncology

## 2024-02-13 DIAGNOSIS — C712 Malignant neoplasm of temporal lobe: Secondary | ICD-10-CM | POA: Diagnosis not present

## 2024-02-13 DIAGNOSIS — Z510 Encounter for antineoplastic radiation therapy: Secondary | ICD-10-CM | POA: Diagnosis not present

## 2024-02-13 DIAGNOSIS — C719 Malignant neoplasm of brain, unspecified: Secondary | ICD-10-CM

## 2024-02-13 LAB — RAD ONC ARIA SESSION SUMMARY
Course Elapsed Days: 18
Plan Fractions Treated to Date: 15
Plan Prescribed Dose Per Fraction: 2 Gy
Plan Total Fractions Prescribed: 23
Plan Total Prescribed Dose: 46 Gy
Reference Point Dosage Given to Date: 30 Gy
Reference Point Session Dosage Given: 2 Gy
Session Number: 15

## 2024-02-16 ENCOUNTER — Other Ambulatory Visit: Payer: Self-pay

## 2024-02-16 ENCOUNTER — Ambulatory Visit
Admission: RE | Admit: 2024-02-16 | Discharge: 2024-02-16 | Disposition: A | Discharge status: Home / Self Care | Source: Ambulatory Visit | Attending: Radiation Oncology | Admitting: Radiation Oncology

## 2024-02-16 DIAGNOSIS — C712 Malignant neoplasm of temporal lobe: Secondary | ICD-10-CM | POA: Diagnosis not present

## 2024-02-16 DIAGNOSIS — Z510 Encounter for antineoplastic radiation therapy: Secondary | ICD-10-CM | POA: Diagnosis not present

## 2024-02-16 LAB — RAD ONC ARIA SESSION SUMMARY
Course Elapsed Days: 21
Plan Fractions Treated to Date: 16
Plan Prescribed Dose Per Fraction: 2 Gy
Plan Total Fractions Prescribed: 23
Plan Total Prescribed Dose: 46 Gy
Reference Point Dosage Given to Date: 32 Gy
Reference Point Session Dosage Given: 2 Gy
Session Number: 16

## 2024-02-17 ENCOUNTER — Encounter: Payer: Self-pay | Admitting: Neurology

## 2024-02-17 ENCOUNTER — Other Ambulatory Visit: Payer: Self-pay

## 2024-02-17 ENCOUNTER — Ambulatory Visit: Admitting: Physical Therapy

## 2024-02-17 ENCOUNTER — Ambulatory Visit: Admitting: Rehabilitative and Restorative Service Providers"

## 2024-02-17 ENCOUNTER — Encounter: Admitting: Occupational Therapy

## 2024-02-17 ENCOUNTER — Ambulatory Visit
Admission: RE | Admit: 2024-02-17 | Discharge: 2024-02-17 | Disposition: A | Discharge status: Home / Self Care | Source: Ambulatory Visit | Attending: Radiation Oncology | Admitting: Radiation Oncology

## 2024-02-17 DIAGNOSIS — M6281 Muscle weakness (generalized): Secondary | ICD-10-CM

## 2024-02-17 DIAGNOSIS — Z510 Encounter for antineoplastic radiation therapy: Secondary | ICD-10-CM | POA: Diagnosis not present

## 2024-02-17 DIAGNOSIS — M5459 Other low back pain: Secondary | ICD-10-CM

## 2024-02-17 DIAGNOSIS — R2681 Unsteadiness on feet: Secondary | ICD-10-CM

## 2024-02-17 DIAGNOSIS — R42 Dizziness and giddiness: Secondary | ICD-10-CM

## 2024-02-17 LAB — RAD ONC ARIA SESSION SUMMARY
Course Elapsed Days: 22
Plan Fractions Treated to Date: 17
Plan Prescribed Dose Per Fraction: 2 Gy
Plan Total Fractions Prescribed: 23
Plan Total Prescribed Dose: 46 Gy
Reference Point Dosage Given to Date: 34 Gy
Reference Point Session Dosage Given: 2 Gy
Session Number: 17

## 2024-02-17 NOTE — Therapy (Addendum)
 OUTPATIENT PHYSICAL THERAPY NEURO TREATMENT NOTE AND D/C SUMMARY   Patient Name: Alexandra Walker MRN: [id number] DOB:November 02, 1955, 68 y.o., female Today's Date: 02/17/2024  PHYSICAL THERAPY DISCHARGE SUMMARY  Visits from Start of Care: 3  Current functional level related to goals / functional outcomes: SEE BELOW FOR LAST KNOWN PATIENT STATUS   Education / Equipment: HEP   Patient agrees to discharge. Patient goals were not met. Patient is being discharged due to not returning since the last visit.   PCP:   Jodie Lavern LITTIE, MD   REFERRING PROVIDER: Rosslyn Dino HERO, MD  END OF SESSION:  PT End of Session - 02/17/24 1045     Visit Number 3    Number of Visits 5    Date for Recertification  03/03/24    Authorization Type [employer]    PT Start Time [phone]    PT Stop Time 1040    PT Time Calculation (min) 54 min    Activity Tolerance Patient tolerated treatment well    Behavior During Therapy [facility] for tasks assessed/performed            Past Medical History:  Diagnosis Date   ADD (attention deficit disorder)    Allergy    Anticardiolipin syndrome    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   lasted about 6 hours, Echo did not show anything   Bilateral swelling of feet    Cholelithiasis    Chronic depressive disorder    Chronic diastolic CHF (congestive heart failure) (HCC)    Circadian rhythm sleep disorder, shift work    DDD (degenerative disc disease), 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   Diabetes (HCC)    Diverticulosis    Minimal   Fatty liver    GERD (gastroesophageal reflux disease)    Heart murmur    Hepatic steatosis    History of hiatal hernia 2017   Small   HLA B27 (HLA B27 positive)    Hx of pancreatitis    Hyperlipidemia    Hypertension    IBS (irritable bowel syndrome)    Ischemic colitis    Lactose intolerance    Lumbar scoliosis 2010   Mild   Migraine headache without aura 05/10/2015   Multiple thyroid  nodules    Nephrolithiasis 2014   Left, minimal   Obesity due to  excess calories with serious comorbidity 05/24/2019   Palpitations    PONV (postoperative nausea and vomiting)    Prediabetes    Presbyopia    Right bundle branch block    Sepsis (HCC) 01/2016   Sleep apnea    Sleep hypopnea    Vitamin D  deficiency    Past Surgical History:  Procedure Laterality Date   APPLICATION OF CRANIAL NAVIGATION N/A 12/29/2023   Procedure: COMPUTER-ASSISTED NAVIGATION, FOR CRANIAL PROCEDURE;  Surgeon: Rosslyn Dino HERO, MD;  Location: [facility] OR;  Service: Neurosurgery;  Laterality: N/A;   BREAST BIOPSY Left    CERVICAL CONE BIOPSY     CHOLECYSTECTOMY N/A 10/26/2014   Procedure: LAPAROSCOPIC CHOLECYSTECTOMY;  Surgeon: Vicenta Poli, MD;  Location: [facility];  Service: General;  Laterality: N/A;   COLONOSCOPY W/ BIOPSIES  multiple   CRANIOTOMY Right 12/29/2023   Procedure: CRANIOTOMY TUMOR EXCISION;  Surgeon: Rosslyn Dino HERO, MD;  Location: [facility];  Service: Neurosurgery;  Laterality: Right;   CYSTOSCOPY WITH RETROGRADE PYELOGRAM, URETEROSCOPY AND STENT PLACEMENT Left 06/26/2018   Procedure: CYSTOSCOPY WITH RETROGRADE PYELOGRAM, URETEROSCOPY AND STENT PLACEMENT;  Surgeon: Alvaro Hummer, MD;  Location: [facility];  Service: Urology;  Laterality: Left;   DIAGNOSTIC LAPAROSCOPY     DILATION AND CURETTAGE OF UTERUS  11/2009   DUPUYTREN CONTRACTURE RELEASE Left    x 3 , Dr Camella   ESOPHAGOGASTRODUODENOSCOPY  multiple   EYE SURGERY     HOLMIUM LASER APPLICATION Left 06/26/2018   Procedure: HOLMIUM LASER APPLICATION;  Surgeon: Alvaro Hummer, MD;  Location[facility];  Service: Urology;  Laterality: Left;   KNEE ARTHROSCOPY Left 11/2008   LAPAROSCOPIC VAGINAL HYSTERECTOMY WITH SALPINGO OOPHORECTOMY Bilateral 10/03/2015   Procedure: LAPAROSCOPIC ASSISTED VAGINAL HYSTERECTOMY WITH SALPINGECTOMY;  Surgeon: Shanda SHAUNNA Muscat, MD;  Location: [facility];  Service: Gynecology;  Laterality: Bilateral;   LASIK  1990   RK   LEFT  FINGER SURGERY     LIVER BIOPSY     MASS EXCISION Left 12/24/2012   Procedure: LEFT EXCISION OF PALMAR MASS X TWO;  Surgeon: Elsie Camella, MD;  Location: [facility];  Service: Orthopedics;  Laterality: Left;   PHOTOREFRACTIVE KERATOTOMY  1994   RADIAL KERATOTOMY  1994   TRANSOBTURATOR SLING     Patient Active Problem List   Diagnosis Date Noted   Cancer of temporal lobe (HCC) 01/09/2024   Glioblastoma, IDH-wildtype (HCC) 12/29/2023   Intracranial mass 12/26/2023   Multiple thyroid  nodules 11/19/2023   Thyroid  nodule 10/07/2023   Osteopenia after menopause 06/13/2023   Sprain of interphalangeal joint of right little finger 06/03/2023   Shifting sleep-work schedule, affecting sleep 10/07/2022   Sleep related bruxism 10/07/2022   Esophageal stricture 09/02/2022   Bilateral lower extremity edema 09/02/2022   Controlled type 2 diabetes mellitus without complication, without long-term current use of insulin  (HCC) 05/27/2022   Morbid obesity (HCC) 08/07/2020   Bilateral hip bursitis 05/06/2019   Pain of right hip joint 03/04/2019   Diverticulosis of colon 05/16/2018   Drug-induced pancreatitis, 2015, thought to be due to [facility] 03/29/2018   Nephrolithiasis 03/29/2018   History of vitreous detachment OU, HTN retinopathy OU, High myopia OU, Cataracts OU, Vitreous floaters OU, Keratoconjunctivitis sicca OU not specified as Sjogren's 03/29/2018   Anticardiolipin syndrome    Attention deficit hyperactivity disorder (ADHD), combined type 10/19/2017   Recurrent circadian rhythm sleep disorder, shift work type 03/31/2017   IBS (irritable bowel syndrome), diarrhea predominant 03/17/2017   NAFLD (nonalcoholic fatty liver disease) 94/89/7981   OSA on CPAP, followed by Neurology/Sleep 09/26/2016   History of sepsis 02/01/2016   Hypertension associated with diabetes (HCC) 01/31/2016   Combined hyperlipidemia associated with type 2 diabetes mellitus (HCC) 05/10/2015   Vitamin D   deficiency, on Vit D 2000 IU daily 05/10/2015   Paroxysmal SVT (supraventricular tachycardia) 09/19/2011    ONSET DATE: 12/29/23  REFERRING DIAG: D49.6 (ICD-10-CM) - Brain tumor (HCC)  THERAPY DIAG:  Muscle weakness (generalized)  Unsteadiness on feet  Dizziness and giddiness  Other low back pain  Rationale for Evaluation and Treatment: Rehabilitation  SUBJECTIVE:  SUBJECTIVE STATEMENT: The patient reports she gets dizziness after radiation treatments. She also feels limited due to back pain. She has not had time to do exercises due to daily radiation M-F.   EVAL: Patient reports that she was up all night with a HA- feels like a tension HA in her neck. Just completed 3rd round of radiation yesterday. Denies dizziness but reports swimmyheadedness when turning too fast. Denies diplopia or blurred vision. Reports some balance changes and weakness which she believes is deconditioning. Reports that she uses a walking stick to walk outside. Patient reports that she also has hx of sciatica and currently going through a flare this week. Reports feeling a spasm in the R LB. Denies N/T or radiation. Pt denies being given post-op precautions. Patient is taking chemo pills daily and radiation M-F for 6 weeks (this week was 1st week).   PERTINENT HISTORY: ADD, anticardiolipin syndrome, depression, CHF, DM, hiatal hernia, HLD, HTN, migraine Patient is taking chemo pills daily and radiation M-F for 6 weeks (eval week was 1st week).   PAIN:  Are you having pain? Yes: NPRS scale: resolved Pain location: mild R low back, headache Pain description: headache and cramp  Aggravating factors: walking without heel lift Relieving factors: massage, change of meds  PRECAUTIONS: Fall  WEIGHT BEARING RESTRICTIONS:  No  FALLS: Has patient fallen in last 6 months? Yes. 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, and Leisure: pt reports that she has a treadmill and enjoys walking  PATIENT GOALS: improve balance and strength   OBJECTIVE:   [facility] Adult PT Treatment:                                                DATE: 02/17/24 Therapeutic Exercise: Standing QL stretch in door frame-- feels in latissimus--so did not provide for HEP Supine Lumbar rocking Marching x 10 reps Bridges-- very difficult SLR x 10 reps right and left sides Bent knee fallouts x 5 reps right and left  Seated Thoracic extension over chair Attempted flexion with elbows at wall and this leads to pain between shoulder blades -- discontinued Neuromuscular re-ed: Seated VOR x 1 horizontal x 30 seconds with cues for HEP Standing balance in corner Feet together eyes closed Feet together horizontal head motion Discussed updates for HEP Therapeutic Activity: Sit<>stand x 10 reps dec'ing UE support Discussed goals of therapy and combining with plan to attend Cancer FITT class in [place]   TODAY'S TREATMENT: 02/10/2024 Activity Comments  Standing corner balance exercises: Feet apart EO head turns/nods   1-2/10 symptoms dizziness with vertical head motions, settles quickly  180 degree turns in doorway R and L, then turn head to target ahead of turn      Seated pelvic tilt-review of previously given exercise as part of HEP   Cues for finding neutral spine and abdominal activation       HOME EXERCISE PROGRAM: Access Code: [email or web address] URL: [email or web address] Date: 02/17/2024 Prepared by:  Tawni Ferrier  Exercises - Supine Active Straight Leg Raise  - 1 x daily - 4 x  weekly - 1 sets - 10 reps - Seated Pelvic Tilt  - 1 x daily - 4 x weekly - 2 sets - 10 reps - Seated Thoracic Lumbar Extension with Pectoralis Stretch  - 1 x daily - 4 x weekly - 1 sets - 5 reps - Seated Gaze Stabilization with Head Rotation  - 2 x daily - 7 x weekly - 1 sets - 2 reps - 30 seconds hold - Corner Balance Feet Together: Eyes Open With Head Turns  - 1 x daily - 4 x weekly - 1 sets - 10 reps - Corner Balance Feet Together With Eyes Closed  - 1 x daily - 4 x weekly - 1 sets - 3 reps - 30 seconds hold  M-CTSIB  Condition 1: Firm Surface, EO 30 Sec, Normal Sway  Condition 2: Firm Surface, EC 27 Sec, Moderate Sway  Condition 3: Foam Surface, EO 30 Sec, Normal Sway  Condition 4: Foam Surface, EC 6.56 Sec, Severe Sway    PATIENT EDUCATION: Education details: rationale for vestibular/multi-sensory balance exercises and attempts at habituation of movement exercises; answered pt's questions, including trying to change to either [facility] or [place] [facility] due to to proximity to her home Person educated: Patient Education method: Explanation, Demonstration, and Handouts Education comprehension: verbalized understanding, returned demonstration, and needs further education  ------------------------------------- Note: Objective measures were completed at Evaluation unless otherwise noted.  DIAGNOSTIC FINDINGS: 12/26/23 underwent an MRI of the brain with contrast which redemonstrated: the posterior right temporal lobe mass highly suspicious for high-grade primary glial neoplasm with avid peripheral enhancement and central necrosis measuring approximately 4.5 x 3.9 cm, with associated dural enhancement extending anteriorly along the right temporal convexity. underwent a right craniotomy for resection of the posterior right temporal lobe mass on 12/29/23 under the care of Dr. Rosslyn. 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 revealed: WHO grade 4 high-grade glioma  COGNITION: Overall cognitive status: Within functional limits for tasks assessed   SENSATION: Pt denies N/T in UEs/LEs   COORDINATION: Alternating pronation/supination: slightly slowed L Alternating toe tap: WNL B Finger to nose: slightly slowed L   MUSCLE TONE: WNL B LEs   PALPATION: c/o pain in R PSIS, TTP over R QL and lumbar paraspinals   POSTURE: posterior lean in sitting   LOWER EXTREMITY ROM:     Active  Right Eval Left Eval  Hip flexion    Hip extension    Hip abduction    Hip adduction    Hip internal rotation    Hip external rotation    Knee flexion    Knee extension    Ankle dorsiflexion 18 15  Ankle plantarflexion    Ankle inversion    Ankle eversion     (Blank rows = not tested)  LOWER EXTREMITY MMT:    MMT Right Eval Left Eval  Hip flexion 4 4  Hip extension    Hip abduction 4 4  Hip adduction 4 4  Hip internal rotation    Hip external rotation    Knee flexion 4 4  Knee extension 4 4  Ankle dorsiflexion 4+ 4+  Ankle plantarflexion 4+ 4+  Ankle inversion    Ankle eversion    (Blank rows = not tested)  GAIT: Findings: Assistive device utilized:None, Level of assistance: Modified independence, and Comments: slowed gait     [facility] PT Assessment - 01/28/24 0001       Standardized Balance Assessment   Standardized Balance Assessment 10 meter walk test  10 Meter Walk 12.7 sec (2.58 ftsec)     Functional Gait  Assessment   Gait assessed  Yes    Gait Level Surface Walks 20 ft in less than 7 sec but greater than 5.5 sec, uses assistive device, slower speed, mild gait deviations, or deviates 6-10 in outside of the 12 in walkway width.    Change in Gait Speed Able to smoothly change walking speed without loss of balance or gait deviation. Deviate no more than 6 in outside of the 12 in walkway width.    Gait with Horizontal Head Turns Performs head turns with moderate changes in gait  velocity, slows down, deviates 10-15 in outside 12 in walkway width but recovers, can continue to walk.    Gait with Vertical Head Turns Performs head turns with no change in gait. Deviates no more than 6 in outside 12 in walkway width.    Gait and Pivot Turn Pivot turns safely in greater than 3 sec and stops with no loss of balance, or pivot turns safely within 3 sec and stops with mild imbalance, requires small steps to catch balance.    Step Over Obstacle Is able to step over 2 stacked shoe boxes taped together (9 in total height) without changing gait speed. No evidence of imbalance.    Gait with Narrow Base of Support Ambulates less than 4 steps heel to toe or cannot perform without assistance.    Gait with Eyes Closed Walks 20 ft, uses assistive device, slower speed, mild gait deviations, deviates 6-10 in outside 12 in walkway width. Ambulates 20 ft in less than 9 sec but greater than 7 sec.    Ambulating Backwards Walks 20 ft, slow speed, abnormal gait pattern, evidence for imbalance, deviates 10-15 in outside 12 in walkway width.    Steps Alternating feet, must use rail.    Total Score 19         5xSTS: 18.06 sec with armrests occasionally      ________________________________________________________________________________ GOALS: Goals reviewed with patient? Yes  SHORT TERM GOALS: Target date: 02/11/2024  Patient to be independent with initial HEP. Baseline: HEP initiated Goal status: MET   LONG TERM GOALS: Target date: 03/03/2024  Patient to be independent with advanced HEP. Baseline: Not yet initiated  Goal status: IN PROGRESS  Patient to demonstrate B LE strength >/=4+/5.  Baseline: See above Goal status: IN PROGRESS  Patient to demonstrate gait speed of at least 3.0 ft/sec in order to improve community access.  Baseline: 2.9ft/sec Goal status: IN PROGRESS  Patient to score at least 23/30 on FGA in order to decrease risk of falls.  Baseline: 19 Goal status: IN  PROGRESS  Patient to demonstrate 5xSTS test in <15 sec in order to decrease risk of falls.  Baseline: 18 sec Goal status: IN PROGRESS  Patient to return to walking program for at least 15 minutes with LRAD for safety and without fatigue limiting.  Baseline: reports walking outside with walking stick for unspecified amount of time  Goal status: IN PROGRESS   ASSESSMENT:  CLINICAL IMPRESSION: The patient is fatiguing some from radiation and also notes overall feeling overwhelmed with medical visits. We updated her HEP today to have increased challenge for standing balance, modified core activities (does not have physioball at home), and adding VOR for HEP. Plan to progress to LTGs as patient is able to tolerate with fatigue.   FROM EVAL:  Patient is a 68 y/o F presenting to [facility] with c/o HA, imbalance, weakness,  and swimmyheadedness s/p right craniotomy for resection of posterior right temporal lobe mass on 12/29/23. Patient today presenting with slowed coordination testing in L UE, TTP over R QL, posterior lean in sitting, B hip and knee weakness, slow gait speed, decreased transfer speed and imbalance. Patient's score on FGA indicates an increased risk of falls. Patient was educated on gentle stretching HEP and reported understanding. Prior to current episode, patient was independent. Would benefit from skilled PT services 1 x/week for 4 weeks to address aforementioned impairments in order to optimize level of function.    OBJECTIVE IMPAIRMENTS: Abnormal gait, decreased activity tolerance, decreased balance, decreased coordination, decreased endurance, decreased strength, dizziness, increased muscle spasms, postural dysfunction, and pain.   PLAN:  PT FREQUENCY: 1x/week  PT DURATION: 4 weeks  PLANNED INTERVENTIONS: 97164- PT Re-evaluation, 97750- Physical Performance Testing, 97110-Therapeutic exercises, 97530- Therapeutic activity, [id number]- Neuromuscular re-education, 97535- Self Care,  97140- Manual therapy, [phone]- Gait training, 04007- Canalith repositioning, [id number]- Aquatic Therapy, [phone]- Electrical stimulation (unattended), [phone] (1-2 muscles), [id number] (3+ muscles)- Dry Needling, Patient/Family education, Balance training, Stair training, Taping, Vestibular training, Cryotherapy, and Moist heat  PLAN FOR NEXT SESSION: review and continue to develop HEP for core stability, hip/knee strengthening (the strengthening may be getting incorporated from the [facility] program) and balance; may incorporate vestibular challenges or habituation into balance training to address swimmyheadedness  Ledell Codrington, PT 02/17/24 10:46 AM

## 2024-02-18 ENCOUNTER — Ambulatory Visit
Admission: RE | Admit: 2024-02-18 | Discharge: 2024-02-18 | Disposition: A | Discharge status: Home / Self Care | Source: Ambulatory Visit | Attending: Radiation Oncology | Admitting: Radiation Oncology

## 2024-02-18 ENCOUNTER — Other Ambulatory Visit: Payer: Self-pay

## 2024-02-18 DIAGNOSIS — C719 Malignant neoplasm of brain, unspecified: Secondary | ICD-10-CM | POA: Insufficient documentation

## 2024-02-18 DIAGNOSIS — C712 Malignant neoplasm of temporal lobe: Secondary | ICD-10-CM | POA: Diagnosis not present

## 2024-02-18 DIAGNOSIS — Z510 Encounter for antineoplastic radiation therapy: Secondary | ICD-10-CM | POA: Diagnosis not present

## 2024-02-18 LAB — RAD ONC ARIA SESSION SUMMARY
Course Elapsed Days: 23
Plan Fractions Treated to Date: 18
Plan Prescribed Dose Per Fraction: 2 Gy
Plan Total Fractions Prescribed: 23
Plan Total Prescribed Dose: 46 Gy
Reference Point Dosage Given to Date: 36 Gy
Reference Point Session Dosage Given: 2 Gy
Session Number: 18

## 2024-02-19 ENCOUNTER — Ambulatory Visit
Admission: RE | Admit: 2024-02-19 | Discharge: 2024-02-19 | Disposition: A | Discharge status: Home / Self Care | Source: Ambulatory Visit | Attending: Radiation Oncology | Admitting: Radiation Oncology

## 2024-02-19 ENCOUNTER — Other Ambulatory Visit: Payer: Self-pay

## 2024-02-19 DIAGNOSIS — Z510 Encounter for antineoplastic radiation therapy: Secondary | ICD-10-CM | POA: Diagnosis not present

## 2024-02-19 DIAGNOSIS — C719 Malignant neoplasm of brain, unspecified: Secondary | ICD-10-CM | POA: Diagnosis not present

## 2024-02-19 DIAGNOSIS — C712 Malignant neoplasm of temporal lobe: Secondary | ICD-10-CM | POA: Diagnosis not present

## 2024-02-19 LAB — RAD ONC ARIA SESSION SUMMARY
Course Elapsed Days: 24
Plan Fractions Treated to Date: 19
Plan Prescribed Dose Per Fraction: 2 Gy
Plan Total Fractions Prescribed: 23
Plan Total Prescribed Dose: 46 Gy
Reference Point Dosage Given to Date: 38 Gy
Reference Point Session Dosage Given: 2 Gy
Session Number: 19

## 2024-02-20 ENCOUNTER — Ambulatory Visit
Admission: RE | Admit: 2024-02-20 | Discharge: 2024-02-20 | Disposition: A | Discharge status: Home / Self Care | Source: Ambulatory Visit | Attending: Radiation Oncology | Admitting: Radiation Oncology

## 2024-02-20 ENCOUNTER — Other Ambulatory Visit: Payer: Self-pay

## 2024-02-20 DIAGNOSIS — C719 Malignant neoplasm of brain, unspecified: Secondary | ICD-10-CM | POA: Diagnosis not present

## 2024-02-20 DIAGNOSIS — C712 Malignant neoplasm of temporal lobe: Secondary | ICD-10-CM | POA: Diagnosis not present

## 2024-02-20 DIAGNOSIS — Z510 Encounter for antineoplastic radiation therapy: Secondary | ICD-10-CM | POA: Diagnosis not present

## 2024-02-20 LAB — RAD ONC ARIA SESSION SUMMARY
Course Elapsed Days: 25
Plan Fractions Treated to Date: 20
Plan Prescribed Dose Per Fraction: 2 Gy
Plan Total Fractions Prescribed: 23
Plan Total Prescribed Dose: 46 Gy
Reference Point Dosage Given to Date: 40 Gy
Reference Point Session Dosage Given: 2 Gy
Session Number: 20

## 2024-02-23 ENCOUNTER — Ambulatory Visit
Admission: RE | Admit: 2024-02-23 | Discharge: 2024-02-23 | Disposition: A | Discharge status: Home / Self Care | Source: Ambulatory Visit | Attending: Radiation Oncology | Admitting: Radiation Oncology

## 2024-02-23 ENCOUNTER — Other Ambulatory Visit: Payer: Self-pay

## 2024-02-23 ENCOUNTER — Inpatient Hospital Stay: Admitting: Internal Medicine

## 2024-02-23 ENCOUNTER — Inpatient Hospital Stay

## 2024-02-23 ENCOUNTER — Other Ambulatory Visit (HOSPITAL_BASED_OUTPATIENT_CLINIC_OR_DEPARTMENT_OTHER): Payer: Self-pay

## 2024-02-23 VITALS — BP 139/73 | HR 75 | Temp 97.6°F | Resp 17 | Ht 66.0 in | Wt 256.0 lb

## 2024-02-23 DIAGNOSIS — Z818 Family history of other mental and behavioral disorders: Secondary | ICD-10-CM | POA: Insufficient documentation

## 2024-02-23 DIAGNOSIS — E6609 Other obesity due to excess calories: Secondary | ICD-10-CM | POA: Insufficient documentation

## 2024-02-23 DIAGNOSIS — K760 Fatty (change of) liver, not elsewhere classified: Secondary | ICD-10-CM | POA: Insufficient documentation

## 2024-02-23 DIAGNOSIS — Z90710 Acquired absence of both cervix and uterus: Secondary | ICD-10-CM | POA: Insufficient documentation

## 2024-02-23 DIAGNOSIS — Z79630 Long term (current) use of alkylating agent: Secondary | ICD-10-CM | POA: Insufficient documentation

## 2024-02-23 DIAGNOSIS — Z888 Allergy status to other drugs, medicaments and biological substances status: Secondary | ICD-10-CM | POA: Insufficient documentation

## 2024-02-23 DIAGNOSIS — C712 Malignant neoplasm of temporal lobe: Secondary | ICD-10-CM | POA: Diagnosis not present

## 2024-02-23 DIAGNOSIS — E1165 Type 2 diabetes mellitus with hyperglycemia: Secondary | ICD-10-CM | POA: Insufficient documentation

## 2024-02-23 DIAGNOSIS — Z90722 Acquired absence of ovaries, bilateral: Secondary | ICD-10-CM | POA: Insufficient documentation

## 2024-02-23 DIAGNOSIS — Z8719 Personal history of other diseases of the digestive system: Secondary | ICD-10-CM | POA: Insufficient documentation

## 2024-02-23 DIAGNOSIS — Z9079 Acquired absence of other genital organ(s): Secondary | ICD-10-CM | POA: Insufficient documentation

## 2024-02-23 DIAGNOSIS — Z833 Family history of diabetes mellitus: Secondary | ICD-10-CM | POA: Insufficient documentation

## 2024-02-23 DIAGNOSIS — G4730 Sleep apnea, unspecified: Secondary | ICD-10-CM | POA: Insufficient documentation

## 2024-02-23 DIAGNOSIS — G4700 Insomnia, unspecified: Secondary | ICD-10-CM | POA: Insufficient documentation

## 2024-02-23 DIAGNOSIS — C719 Malignant neoplasm of brain, unspecified: Secondary | ICD-10-CM | POA: Diagnosis not present

## 2024-02-23 DIAGNOSIS — Z79899 Other long term (current) drug therapy: Secondary | ICD-10-CM | POA: Insufficient documentation

## 2024-02-23 DIAGNOSIS — Z885 Allergy status to narcotic agent status: Secondary | ICD-10-CM | POA: Insufficient documentation

## 2024-02-23 DIAGNOSIS — Z9049 Acquired absence of other specified parts of digestive tract: Secondary | ICD-10-CM | POA: Insufficient documentation

## 2024-02-23 DIAGNOSIS — K589 Irritable bowel syndrome without diarrhea: Secondary | ICD-10-CM | POA: Insufficient documentation

## 2024-02-23 DIAGNOSIS — Z8249 Family history of ischemic heart disease and other diseases of the circulatory system: Secondary | ICD-10-CM | POA: Insufficient documentation

## 2024-02-23 DIAGNOSIS — Z510 Encounter for antineoplastic radiation therapy: Secondary | ICD-10-CM | POA: Diagnosis not present

## 2024-02-23 DIAGNOSIS — Z87442 Personal history of urinary calculi: Secondary | ICD-10-CM | POA: Insufficient documentation

## 2024-02-23 DIAGNOSIS — Z8419 Family history of other disorders of kidney and ureter: Secondary | ICD-10-CM | POA: Insufficient documentation

## 2024-02-23 DIAGNOSIS — I5032 Chronic diastolic (congestive) heart failure: Secondary | ICD-10-CM | POA: Insufficient documentation

## 2024-02-23 DIAGNOSIS — Z7982 Long term (current) use of aspirin: Secondary | ICD-10-CM | POA: Insufficient documentation

## 2024-02-23 DIAGNOSIS — Z83438 Family history of other disorder of lipoprotein metabolism and other lipidemia: Secondary | ICD-10-CM | POA: Insufficient documentation

## 2024-02-23 DIAGNOSIS — Z8349 Family history of other endocrine, nutritional and metabolic diseases: Secondary | ICD-10-CM | POA: Insufficient documentation

## 2024-02-23 DIAGNOSIS — I110 Hypertensive heart disease with heart failure: Secondary | ICD-10-CM | POA: Insufficient documentation

## 2024-02-23 LAB — CBC WITH DIFFERENTIAL (CANCER CENTER ONLY)
Abs Immature Granulocytes: 0.02 K/uL (ref 0.00–0.07)
Basophils Absolute: 0 K/uL (ref 0.0–0.1)
Basophils Relative: 1 %
Eosinophils Absolute: 0.2 K/uL (ref 0.0–0.5)
Eosinophils Relative: 4 %
HCT: 41.3 % (ref 36.0–46.0)
Hemoglobin: 13.8 g/dL (ref 12.0–15.0)
Immature Granulocytes: 1 %
Lymphocytes Relative: 13 %
Lymphs Abs: 0.5 K/uL — ABNORMAL LOW (ref 0.7–4.0)
MCH: 28.4 pg (ref 26.0–34.0)
MCHC: 33.4 g/dL (ref 30.0–36.0)
MCV: 85 fL (ref 80.0–100.0)
Monocytes Absolute: 0.3 K/uL (ref 0.1–1.0)
Monocytes Relative: 8 %
Neutro Abs: 2.7 K/uL (ref 1.7–7.7)
Neutrophils Relative %: 73 %
Platelet Count: 171 K/uL (ref 150–400)
RBC: 4.86 MIL/uL (ref 3.87–5.11)
RDW: 14.1 % (ref 11.5–15.5)
WBC Count: 3.7 K/uL — ABNORMAL LOW (ref 4.0–10.5)
nRBC: 0 % (ref 0.0–0.2)

## 2024-02-23 LAB — CMP (CANCER CENTER ONLY)
ALT: 20 U/L (ref 0–44)
AST: 15 U/L (ref 15–41)
Albumin: 3.9 g/dL (ref 3.5–5.0)
Alkaline Phosphatase: 92 U/L (ref 38–126)
Anion gap: 4 — ABNORMAL LOW (ref 5–15)
BUN: 11 mg/dL (ref 8–23)
CO2: 27 mmol/L (ref 22–32)
Calcium: 9.3 mg/dL (ref 8.9–10.3)
Chloride: 108 mmol/L (ref 98–111)
Creatinine: 0.66 mg/dL (ref 0.44–1.00)
GFR, Estimated: >60 mL/min (ref >60)
Glucose, Bld: 186 mg/dL — ABNORMAL HIGH (ref 70–99)
Potassium: 4.1 mmol/L (ref 3.5–5.1)
Sodium: 139 mmol/L (ref 135–145)
Total Bilirubin: 0.4 mg/dL (ref 0.0–1.2)
Total Protein: 6.3 g/dL — ABNORMAL LOW (ref 6.5–8.1)

## 2024-02-23 LAB — RAD ONC ARIA SESSION SUMMARY
Course Elapsed Days: 28
Plan Fractions Treated to Date: 21
Plan Prescribed Dose Per Fraction: 2 Gy
Plan Total Fractions Prescribed: 23
Plan Total Prescribed Dose: 46 Gy
Reference Point Dosage Given to Date: 42 Gy
Reference Point Session Dosage Given: 2 Gy
Session Number: 21

## 2024-02-23 MED ORDER — PROCHLORPERAZINE MALEATE 10 MG PO TABS
10.0000 mg | ORAL_TABLET | Freq: Four times a day (QID) | ORAL | 0 refills | Status: DC | PRN
  Filled 2024-02-23: qty 30, 8d supply, fill #0

## 2024-02-23 NOTE — Progress Notes (Signed)
 [facility] at [facility] [address]  [place], [place] [address] [phone]   Interval Evaluation  Date of Service: 02/23/24 Patient Name: Alexandra Walker Patient MRN: [id number] Patient DOB: 1956-04-07 Provider: Arthea MARLA Manns, MD  Identifying Statement:  Alexandra Walker is a 68 y.o. female with right temporal glioblastoma    Oncologic History: Oncology History  Glioblastoma, IDH-wildtype (HCC)  12/29/2023 Surgery   Right temporal craniotomy, resection with Dr. Rosslyn; path is grade 4 glioma   01/26/2024 -  Chemotherapy   Patient is on Treatment Plan : BRAIN GLIOBLASTOMA Radiation Therapy With Concurrent Temozolomide  75 mg/m2 Daily Followed By Sequential Maintenance Temozolomide  x 6-12 cycles        Interval History: Alexandra Walker presents today for follow up, now having completed 4 weeks of radiation and Temodar .  She is tolerating treatment well so far without any signficant issues.  Remains independent with gait, functional status.  No recurrence of UTI symptoms now having completed the antibiotics.  Denies seizures, headaches.  H+P (01/08/24) Patient presented to neurologic attention with several days history of difficulty driving/navigating, some disorganization at work, and headaches.  CNS imaging demonstrated an enhancing mass within the right temporal lobe, c/w primary brain tumor.  She underwent craniotomy, resection with Dr. Rosslyn on 12/29/23; pathology demonstrated grade 4 glioma, IDH pending.  Since surgery, she feels closer to her prior baseline, independent with gait.  Decadron  is at 4mg  twice per day, which has caused some insomnia and elevated blood sugar.  Still on post-op Keppra .    Medications: Current Outpatient Medications on File Prior to Visit  Medication Sig Dispense Refill   aspirin  325 MG tablet Take 325 mg by mouth daily.     butalbital -acetaminophen -caffeine  (FIORICET ) 50-325-40 MG tablet Take 2 tablets by mouth every  4 (four) hours as needed for headache (moderate headache). 60 tablet 0   cholecalciferol  (VITAMIN D ) 1000 UNITS tablet Take 2,000 Units by mouth daily.      cyanocobalamin  (VITAMIN B12) 1000 MCG tablet Take 1,000 mcg by mouth daily.     fluticasone  (FLONASE ) 50 MCG/ACT nasal spray Place 1 spray into both nostrils in the morning and at bedtime. 16 g 6   furosemide  (LASIX ) 40 MG tablet Take 1 tablet (40 mg total) by mouth daily. 90 tablet 1   losartan  (COZAAR ) 100 MG tablet Take 1 tablet (100 mg total) by mouth daily. 90 tablet 3   Lutein 6 MG CAPS Take 6 mg by mouth daily.     metoprolol  succinate (TOPROL -XL) 100 MG 24 hr tablet Take 1 tablet (100 mg total) by mouth daily with or immediately following a meal. 90 tablet 2   Multiple Vitamin (MULTIVITAMIN WITH MINERALS) TABS tablet Take 1 tablet by mouth daily.     ondansetron  (ZOFRAN ) 8 MG tablet Take 1 tablet (8 mg total) by mouth every 8 (eight) hours as needed for nausea or vomiting. May take 30-60 minutes prior to Temodar  administration if nausea/vomiting occurs as needed. 30 tablet 1   potassium chloride  SA (KLOR-CON  M) 20 MEQ tablet Take 1 tablet (20 mEq total) by mouth daily. 90 tablet 1   prochlorperazine  (COMPAZINE ) 10 MG tablet Take 1 tablet (10 mg total) by mouth every 6 (six) hours as needed for nausea or vomiting. 30 tablet 0   rosuvastatin  (CRESTOR ) 20 MG tablet Take 1 tablet (20 mg total) by mouth daily. 90 tablet 3   temozolomide  (TEMODAR ) 180 MG capsule Take 1 capsule (180 mg  total) by mouth daily. May take on an empty stomach to decrease nausea & vomiting. 42 capsule 0   traZODone  (DESYREL ) 50 MG tablet Take 0.5-1 tablets (25-50 mg total) by mouth at bedtime as needed for sleep. 30 tablet 3   No current facility-administered medications on file prior to visit.    Allergies:  Allergies  Allergen Reactions   Ace Inhibitors Other (See Comments)    coughing   Dust Mite Extract Other (See Comments)    Sneezing and congestion    Oxycodone  Hcl Other (See Comments) and Nausea And Vomiting    NDC Rniz:[id number]   Oxycodone -Acetaminophen  Nausea And Vomiting   Tree Extract Other (See Comments)    Tree pollen and scrub pollen    Vicodin [Hydrocodone-Acetaminophen ] Nausea And Vomiting   Victoza [Liraglutide] Other (See Comments)    pancreatitis   Adhesive [Tape] Rash    Not on stomach   Past Medical History:  Past Medical History:  Diagnosis Date   ADD (attention deficit disorder)    Allergy    Anticardiolipin syndrome    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   lasted about 6 hours, Echo did not show anything   Bilateral swelling of feet    Cholelithiasis    Chronic depressive disorder    Chronic diastolic CHF (congestive heart failure) (HCC)    Circadian rhythm sleep disorder, shift work    DDD (degenerative disc disease), 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   Diabetes (HCC)    Diverticulosis    Minimal   Fatty liver    GERD (gastroesophageal reflux disease)    Heart murmur    Hepatic steatosis    History of hiatal hernia 2017   Small   HLA B27 (HLA B27 positive)    Hx of pancreatitis    Hyperlipidemia    Hypertension    IBS (irritable bowel syndrome)    Ischemic colitis    Lactose intolerance    Lumbar scoliosis 2010   Mild   Migraine headache without aura 05/10/2015   Multiple thyroid  nodules    Nephrolithiasis 2014   Left, minimal   Obesity due to excess calories with serious comorbidity 05/24/2019   Palpitations    PONV (postoperative nausea and vomiting)    Prediabetes    Presbyopia    Right bundle branch block    Sepsis (HCC) 01/2016   Sleep apnea    Sleep hypopnea    Vitamin D  deficiency    Past Surgical History:  Past Surgical History:  Procedure Laterality Date   APPLICATION OF CRANIAL NAVIGATION N/A 12/29/2023   Procedure: COMPUTER-ASSISTED NAVIGATION, FOR CRANIAL PROCEDURE;  Surgeon: Rosslyn Dino HERO, MD;  Location: [facility] OR;  Service: Neurosurgery;  Laterality: N/A;   BREAST BIOPSY Left    CERVICAL CONE  BIOPSY     CHOLECYSTECTOMY N/A 10/26/2014   Procedure: LAPAROSCOPIC CHOLECYSTECTOMY;  Surgeon: Vicenta Poli, MD;  Location: [facility];  Service: General;  Laterality: N/A;   COLONOSCOPY W/ BIOPSIES  multiple   CRANIOTOMY Right 12/29/2023   Procedure: CRANIOTOMY TUMOR EXCISION;  Surgeon: Rosslyn Dino HERO, MD;  Location: [facility] OR;  Service: Neurosurgery;  Laterality: Right;   CYSTOSCOPY WITH RETROGRADE PYELOGRAM, URETEROSCOPY AND STENT PLACEMENT Left 06/26/2018   Procedure: CYSTOSCOPY WITH RETROGRADE PYELOGRAM, URETEROSCOPY AND STENT PLACEMENT;  Surgeon: Alvaro Hummer, MD;  Location: [facility];  Service: Urology;  Laterality: Left;   DIAGNOSTIC LAPAROSCOPY     DILATION AND CURETTAGE OF UTERUS  11/2009   DUPUYTREN CONTRACTURE RELEASE Left    x 3 ,  Dr Camella   ESOPHAGOGASTRODUODENOSCOPY  multiple   EYE SURGERY     HOLMIUM LASER APPLICATION Left 06/26/2018   Procedure: HOLMIUM LASER APPLICATION;  Surgeon: Alvaro Hummer, MD;  Location: [facility];  Service: Urology;  Laterality: Left;   KNEE ARTHROSCOPY Left 11/2008   LAPAROSCOPIC VAGINAL HYSTERECTOMY WITH SALPINGO OOPHORECTOMY Bilateral 10/03/2015   Procedure: LAPAROSCOPIC ASSISTED VAGINAL HYSTERECTOMY WITH SALPINGECTOMY;  Surgeon: Shanda SHAUNNA Muscat, MD;  Location: [facility];  Service: Gynecology;  Laterality: Bilateral;   LASIK  1990   RK   LEFT FINGER SURGERY     LIVER BIOPSY     MASS EXCISION Left 12/24/2012   Procedure: LEFT EXCISION OF PALMAR MASS X TWO;  Surgeon: Elsie Camella, MD;  Location: [facility];  Service: Orthopedics;  Laterality: Left;   PHOTOREFRACTIVE KERATOTOMY  1994   RADIAL KERATOTOMY  1994   TRANSOBTURATOR SLING     Social History:  Social History   Socioeconomic History   Marital status: Divorced    Spouse name: Not on file   Number of children: 1   Years of education: Not on file   Highest education level: Master's degree (e.g., MA, MS, MEng,  MEd, MSW, MBA)  Occupational History   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   Social Drivers of Health   Financial Resource Strain: Low Risk  (06/13/2023)   Overall Financial Resource Strain (CARDIA)    Difficulty of Paying Living Expenses: Not hard at all  Food Insecurity: No Food Insecurity (01/09/2024)   Hunger Vital Sign    Worried About Running Out of Food in the Last Year: Never true    Ran Out of Food in the Last Year: Never true  Transportation Needs: No Transportation Needs (01/09/2024)   PRAPARE - Administrator, Civil Service (Medical): No    Lack of Transportation (Non-Medical): No  Physical Activity: Insufficiently Active (06/13/2023)   Exercise Vital Sign    Days of Exercise per Week: 1 day    Minutes of Exercise per Session: 30 min  Stress: No Stress Concern Present (06/13/2023)   [employer] of Occupational Health - Occupational Stress Questionnaire    Feeling of Stress : Only a little  Social Connections: Moderately Isolated (12/27/2023)   Social Connection and Isolation Panel    Frequency of Communication with Friends and Family: More than three times a week    Frequency of Social Gatherings with Friends and Family: Once a week    Attends Religious Services: More than 4 times per year    Active Member of [employer] or Organizations: No    Attends Banker Meetings: Never    Marital Status: Divorced  Catering manager Violence: Not At Risk (01/09/2024)   Humiliation, Afraid, Rape, and Kick questionnaire     Fear of Current or Ex-Partner: No    Emotionally Abused: No    Physically Abused: No    Sexually Abused: No   Family History:  Family  History  Problem Relation Age of Onset   Coronary artery disease Mother    Hypertension Mother    Heart disease Mother    Heart attack Mother    Diabetes Mother    Hyperlipidemia Mother    Kidney disease Mother    Depression Mother    Sleep apnea Mother    Obesity Mother    Heart attack Father    Heart disease Father    Sudden death Father    Hypertension Sister    Hypertension Brother    Colon cancer Neg Hx    Colon polyps Neg Hx    Esophageal cancer Neg Hx    Rectal cancer Neg Hx    Stomach cancer Neg Hx     Review of Systems: Constitutional: Doesn't report fevers, chills or abnormal weight loss Eyes: Doesn't report blurriness of vision Ears, nose, mouth, throat, and face: Doesn't report sore throat Respiratory: Doesn't report cough, dyspnea or wheezes Cardiovascular: Doesn't report palpitation, chest discomfort  Gastrointestinal:  Doesn't report nausea, constipation, diarrhea GU: Doesn't report incontinence Skin: Doesn't report skin rashes Neurological: Per HPI Musculoskeletal: Doesn't report joint pain Behavioral/Psych: Doesn't report anxiety  Physical Exam: Vitals:   02/23/24 1030  BP: 139/73  Pulse: 75  Resp: 17  Temp: 97.6 F (36.4 C)  SpO2: 99%    KPS: 90. General: Alert, cooperative, pleasant, in no acute distress Head: Normal EENT: No conjunctival injection or scleral icterus.  Lungs: Resp effort normal Cardiac: Regular rate Abdomen: Non-distended abdomen Skin: No rashes cyanosis or petechiae. Extremities: No clubbing or edema  Neurologic Exam: Mental Status: Awake, alert, attentive to examiner. Oriented to self and environment. Language is fluent with intact comprehension.  Cranial Nerves: Visual acuity is grossly normal. Visual fields are full. Extra-ocular movements intact. No ptosis. Face is  symmetric Motor: Tone and bulk are normal. Power is full in both arms and legs. Reflexes are symmetric, no pathologic reflexes present.  Sensory: Intact to light touch Gait: Normal.   Labs: I have reviewed the data as listed    Component Value Date/Time   NA 139 02/23/2024 0941   NA 139 11/26/2023 1155   K 4.1 02/23/2024 0941   CL 108 02/23/2024 0941   CO2 27 02/23/2024 0941   GLUCOSE 186 (H) 02/23/2024 0941   BUN 11 02/23/2024 0941   BUN 16 11/26/2023 1155   CREATININE 0.66 02/23/2024 0941   CREATININE 0.76 05/05/2020 1631   CALCIUM  9.3 02/23/2024 0941   PROT 6.3 (L) 02/23/2024 0941   PROT 6.3 09/03/2021 1219   ALBUMIN 3.9 02/23/2024 0941   ALBUMIN 4.1 09/03/2021 1219   AST 15 02/23/2024 0941   ALT 20 02/23/2024 0941   ALKPHOS 92 02/23/2024 0941   BILITOT 0.4 02/23/2024 0941   GFRNONAA >60 02/23/2024 0941   GFRAA >60 04/20/2018 1323   Lab Results  Component Value Date   WBC 3.7 (L) 02/23/2024   NEUTROABS 2.7 02/23/2024   HGB 13.8 02/23/2024   HCT 41.3 02/23/2024   MCV 85.0 02/23/2024   PLT 171 02/23/2024      Assessment/Plan No diagnosis found.  IASHA MCCALISTER is clinically stable today, now having completed 4 weeks of IMRT and concurrent Temodar .  Labs are within normal limits today.  We ultimately recommended continuing with course of intensity modulated radiation therapy and concurrent daily Temozolomide .  Radiation will be administered Mon-Fri over 6 weeks, Temodar  will be dosed at 75mg /m2 to be given daily over 42 days.  We reviewed side effects  of temodar , including fatigue, nausea/vomiting, constipation, and cytopenias.  Chemotherapy should be held for the following:  ANC less than 1,000  Platelets less than 100,000  LFT or creatinine greater than 2x ULN  If clinical concerns/contraindications develop  Should remain off decadron  if tolerated.  Every 2 weeks during radiation, labs will be checked accompanied by a clinical evaluation in the brain  tumor clinic.  All questions were answered. The patient knows to call the clinic with any problems, questions or concerns. No barriers to learning were detected.  The total time spent in the encounter was 30 minutes and more than 50% was on counseling and review of test results   Arthea MARLA Manns, MD Medical Director of Neuro-Oncology [facility] 02/23/24 10:43 AM

## 2024-02-23 NOTE — Telephone Encounter (Signed)
Order sent to [facility].

## 2024-02-24 ENCOUNTER — Other Ambulatory Visit: Payer: Self-pay

## 2024-02-24 ENCOUNTER — Ambulatory Visit: Admitting: Physical Therapy

## 2024-02-24 ENCOUNTER — Ambulatory Visit
Admission: RE | Admit: 2024-02-24 | Discharge: 2024-02-24 | Disposition: A | Source: Ambulatory Visit | Attending: Radiation Oncology

## 2024-02-24 ENCOUNTER — Encounter: Admitting: Occupational Therapy

## 2024-02-24 ENCOUNTER — Ambulatory Visit: Admitting: Rehabilitative and Restorative Service Providers"

## 2024-02-24 DIAGNOSIS — C719 Malignant neoplasm of brain, unspecified: Secondary | ICD-10-CM | POA: Diagnosis not present

## 2024-02-24 DIAGNOSIS — Z510 Encounter for antineoplastic radiation therapy: Secondary | ICD-10-CM | POA: Diagnosis not present

## 2024-02-24 DIAGNOSIS — C712 Malignant neoplasm of temporal lobe: Secondary | ICD-10-CM | POA: Diagnosis not present

## 2024-02-24 LAB — RAD ONC ARIA SESSION SUMMARY
Course Elapsed Days: 29
Plan Fractions Treated to Date: 22
Plan Prescribed Dose Per Fraction: 2 Gy
Plan Total Fractions Prescribed: 23
Plan Total Prescribed Dose: 46 Gy
Reference Point Dosage Given to Date: 44 Gy
Reference Point Session Dosage Given: 2 Gy
Session Number: 22

## 2024-02-25 ENCOUNTER — Ambulatory Visit: Admitting: Family Medicine

## 2024-02-25 ENCOUNTER — Ambulatory Visit
Admission: RE | Admit: 2024-02-25 | Discharge: 2024-02-25 | Disposition: A | Source: Ambulatory Visit | Attending: Radiation Oncology

## 2024-02-25 ENCOUNTER — Telehealth: Admitting: Family Medicine

## 2024-02-25 ENCOUNTER — Other Ambulatory Visit: Payer: Self-pay

## 2024-02-25 ENCOUNTER — Encounter: Payer: Self-pay | Admitting: Internal Medicine

## 2024-02-25 VITALS — Ht 66.0 in | Wt 254.0 lb

## 2024-02-25 DIAGNOSIS — C719 Malignant neoplasm of brain, unspecified: Secondary | ICD-10-CM | POA: Diagnosis not present

## 2024-02-25 DIAGNOSIS — E119 Type 2 diabetes mellitus without complications: Secondary | ICD-10-CM

## 2024-02-25 DIAGNOSIS — C712 Malignant neoplasm of temporal lobe: Secondary | ICD-10-CM | POA: Diagnosis not present

## 2024-02-25 DIAGNOSIS — T451X5A Adverse effect of antineoplastic and immunosuppressive drugs, initial encounter: Secondary | ICD-10-CM

## 2024-02-25 DIAGNOSIS — R5383 Other fatigue: Secondary | ICD-10-CM | POA: Diagnosis not present

## 2024-02-25 DIAGNOSIS — Z510 Encounter for antineoplastic radiation therapy: Secondary | ICD-10-CM | POA: Diagnosis not present

## 2024-02-25 LAB — RAD ONC ARIA SESSION SUMMARY
Course Elapsed Days: 30
Plan Fractions Treated to Date: 23
Plan Prescribed Dose Per Fraction: 2 Gy
Plan Total Fractions Prescribed: 23
Plan Total Prescribed Dose: 46 Gy
Reference Point Dosage Given to Date: 46 Gy
Reference Point Session Dosage Given: 2 Gy
Session Number: 23

## 2024-02-25 NOTE — Patient Instructions (Signed)
 Please return in 6 weeks to recheck diabetes and blood pressure  If you have any questions or concerns, please don't hesitate to send me a message via MyChart or call the office at [phone]. Thank you for visiting with us  today! It's our pleasure caring for you.

## 2024-02-25 NOTE — Progress Notes (Signed)
 Virtual Visit via Video Note  Subjective  CC:  Chief Complaint  Patient presents with   Diabetes    Wanted to know whether to restart Metformin  bc she stopped the mounjaro      I connected with [name] Pouch on 02/25/24 at 10:30 AM EDT by a video enabled telemedicine application and verified that I am speaking with the correct person using two identifiers. Location patient: Home Location provider: [facility] [address] Office Persons participating in the virtual visit: JOLENE GUYETT, Lavern LITTIE Heck, MD Avelina Berber CMA  I discussed the limitations of evaluation and management by telemedicine and the availability of in person appointments. The patient expressed understanding and agreed to proceed. HPI: GIRL SCHISSLER is a 68 y.o. female who was contacted today to address the problems listed above in the chief complaint. Discussed the use of AI scribe software for clinical note transcription with the patient, who gave verbal consent to proceed.  History of Present Illness AASHA DINA is a 68 year old female who presents for diabetes follow-up.  Diabetes control: -Stop Mounjaro .  No longer on metformin .  Monitoring sugars.  Here to see if she needs to restart her medications. - Occasionally checks blood glucose levels - Post-meal blood glucose readings: 119 mg/dL and 849 mg/dL (after consuming fried chicken) - Fasting blood glucose levels range from 110 to 115 mg/dL - Appetite is variable.  Diet is fair. - No symptoms of hyperglycemia  Glioblastoma undergoing radiation therapy and chemotherapy: Had treatment today.  Last treatment will be October [birth year], her 89 birthday.  I reviewed recent oncology notes. Gastrointestinal symptoms - Upset stomach present on the day of the visit - Irritable bowel syndrome exacerbation - No recent nausea or vomiting  Constitutional symptoms - Fatigue and malaise with intermittent periods of feeling  well   Functional status - Attended [employer] and walked for 1.5 hours - Not working due to inability to drive - Utilizing PAL hours and short-term disability benefits - Considering retirement   Assessment  1. Controlled type 2 diabetes mellitus without complication, without long-term current use of insulin  (HCC)   2. Glioblastoma, IDH-wildtype (HCC)   3. Chemotherapy-induced fatigue      Plan  Assessment and Plan Assessment & Plan Glioblastoma Currently undergoing treatment with the last session scheduled for October 17th. Reports fatigue, malaise, and hair loss as side effects. Experienced upset stomach and IBS symptoms but no nausea or vomiting. - Schedule follow-up appointment after treatment completion  Irritable bowel syndrome IBS symptoms exacerbated, likely due to cancer treatment. Reports upset stomach today.  Type 2 diabetes mellitus Blood sugar levels are monitored with postprandial readings of 119 mg/dL and 849 mg/dL, and fasting levels between 110-115 mg/dL. These levels are acceptable. - Check A1c in mid-November - Consider reintroducing metformin  based on A1c results   I discussed the assessment and treatment plan with the patient. The patient was provided an opportunity to ask questions and all were answered. The patient agreed with the plan and demonstrated an understanding of the instructions.   The patient was advised to call back or seek an in-person evaluation if the symptoms worsen or if the condition fails to improve as anticipated. Follow up: 6 weeks to recheck diabetes and blood pressure Visit date not found  No orders of the defined types were placed in this encounter.     I reviewed the patients updated PMH, FH, and SocHx.    Patient  Active Problem List   Diagnosis Date Noted   Thyroid  nodule 10/07/2023    Priority: High   Controlled type 2 diabetes mellitus without complication, without long-term current use of insulin  (HCC)  05/27/2022    Priority: High   Morbid obesity (HCC) 08/07/2020    Priority: High   Attention deficit hyperactivity disorder (ADHD), combined type 10/19/2017    Priority: High   OSA on CPAP, followed by Neurology/Sleep 09/26/2016    Priority: High   Hypertension associated with diabetes (HCC) 01/31/2016    Priority: High   Combined hyperlipidemia associated with type 2 diabetes mellitus (HCC) 05/10/2015    Priority: High   Paroxysmal SVT (supraventricular tachycardia) 09/19/2011    Priority: High   Osteopenia after menopause 06/13/2023    Priority: Medium    Esophageal stricture 09/02/2022    Priority: Medium    Bilateral hip bursitis 05/06/2019    Priority: Medium    Nephrolithiasis 03/29/2018    Priority: Medium    Anticardiolipin syndrome     Priority: Medium    Recurrent circadian rhythm sleep disorder, shift work type 03/31/2017    Priority: Medium    IBS (irritable bowel syndrome), diarrhea predominant 03/17/2017    Priority: Medium    NAFLD (nonalcoholic fatty liver disease) 94/89/7981    Priority: Medium    Bilateral lower extremity edema 09/02/2022    Priority: Low   Diverticulosis of colon 05/16/2018    Priority: Low   Drug-induced pancreatitis, 2015, thought to be due to [facility] 03/29/2018    Priority: Low   History of vitreous detachment OU, HTN retinopathy OU, High myopia OU, Cataracts OU, Vitreous floaters OU, Keratoconjunctivitis sicca OU not specified as Sjogren's 03/29/2018    Priority: Low   History of sepsis 02/01/2016    Priority: Low   Vitamin D  deficiency, on Vit D 2000 IU daily 05/10/2015    Priority: Low   Cancer of temporal lobe (HCC) 01/09/2024   Glioblastoma, IDH-wildtype (HCC) 12/29/2023   Intracranial mass 12/26/2023   Multiple thyroid  nodules 11/19/2023   Sprain of interphalangeal joint of right little finger 06/03/2023   Shifting sleep-work schedule, affecting sleep 10/07/2022   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    Allergies: Patient is allergic to ace inhibitors, dust mite extract, oxycodone  hcl, oxycodone -acetaminophen , tree extract, vicodin [hydrocodone-acetaminophen ], victoza [liraglutide], and adhesive [tape]. Family History: Patient family history includes Coronary artery disease in her mother; Depression in her mother; Diabetes in her mother; Heart attack in her father and mother; Heart disease in her father and mother; Hyperlipidemia in her mother; Hypertension in her brother, mother, and sister; Kidney disease in her mother; Obesity in her mother; Sleep apnea in her mother; Sudden death in her father. Social History:  Patient  reports that she has never smoked. She has never used smokeless tobacco. She reports current alcohol use. She reports that she does not use drugs.  Review of Systems: Constitutional: Negative for fever malaise or anorexia Cardiovascular: negative for chest pain Respiratory: negative for SOB or persistent cough Gastrointestinal: negative for abdominal pain  OBJECTIVE Vitals: Ht 5' 6 (1.676 m)   Wt 254 lb (115.2 kg)   LMP 05/20/2008   BMI 41.00 kg/m  General: no acute distress , A&Ox3, appears fatigued  Lavern CROME Jodie, MD

## 2024-02-26 ENCOUNTER — Ambulatory Visit
Admission: RE | Admit: 2024-02-26 | Discharge: 2024-02-26 | Disposition: A | Source: Ambulatory Visit | Attending: Radiation Oncology

## 2024-02-26 ENCOUNTER — Encounter (HOSPITAL_BASED_OUTPATIENT_CLINIC_OR_DEPARTMENT_OTHER): Payer: Self-pay | Admitting: Obstetrics & Gynecology

## 2024-02-26 ENCOUNTER — Other Ambulatory Visit: Payer: Self-pay

## 2024-02-26 DIAGNOSIS — C719 Malignant neoplasm of brain, unspecified: Secondary | ICD-10-CM | POA: Diagnosis not present

## 2024-02-26 DIAGNOSIS — Z510 Encounter for antineoplastic radiation therapy: Secondary | ICD-10-CM | POA: Diagnosis not present

## 2024-02-26 DIAGNOSIS — C712 Malignant neoplasm of temporal lobe: Secondary | ICD-10-CM | POA: Diagnosis not present

## 2024-02-26 LAB — RAD ONC ARIA SESSION SUMMARY
Course Elapsed Days: 31
Plan Fractions Treated to Date: 1
Plan Prescribed Dose Per Fraction: 2 Gy
Plan Total Fractions Prescribed: 7
Plan Total Prescribed Dose: 14 Gy
Reference Point Dosage Given to Date: 2 Gy
Reference Point Session Dosage Given: 2 Gy
Session Number: 24

## 2024-02-27 ENCOUNTER — Other Ambulatory Visit: Payer: Self-pay

## 2024-02-27 ENCOUNTER — Ambulatory Visit
Admission: RE | Admit: 2024-02-27 | Discharge: 2024-02-27 | Disposition: A | Discharge status: Home / Self Care | Source: Ambulatory Visit | Attending: Radiation Oncology | Admitting: Radiation Oncology

## 2024-02-27 ENCOUNTER — Encounter: Payer: Self-pay | Admitting: Internal Medicine

## 2024-02-27 DIAGNOSIS — Z510 Encounter for antineoplastic radiation therapy: Secondary | ICD-10-CM | POA: Diagnosis not present

## 2024-02-27 DIAGNOSIS — C719 Malignant neoplasm of brain, unspecified: Secondary | ICD-10-CM | POA: Diagnosis not present

## 2024-02-27 DIAGNOSIS — C712 Malignant neoplasm of temporal lobe: Secondary | ICD-10-CM | POA: Diagnosis not present

## 2024-02-27 LAB — RAD ONC ARIA SESSION SUMMARY
Course Elapsed Days: 32
Plan Fractions Treated to Date: 2
Plan Prescribed Dose Per Fraction: 2 Gy
Plan Total Fractions Prescribed: 7
Plan Total Prescribed Dose: 14 Gy
Reference Point Dosage Given to Date: 4 Gy
Reference Point Session Dosage Given: 2 Gy
Session Number: 25

## 2024-03-01 ENCOUNTER — Ambulatory Visit
Admission: RE | Admit: 2024-03-01 | Discharge: 2024-03-01 | Disposition: A | Source: Ambulatory Visit | Attending: Radiation Oncology

## 2024-03-01 ENCOUNTER — Other Ambulatory Visit: Payer: Self-pay

## 2024-03-01 ENCOUNTER — Ambulatory Visit
Admission: RE | Admit: 2024-03-01 | Discharge: 2024-03-01 | Disposition: A | Discharge status: Home / Self Care | Source: Ambulatory Visit | Attending: Radiation Oncology | Admitting: Radiation Oncology

## 2024-03-01 ENCOUNTER — Telehealth: Payer: Self-pay

## 2024-03-01 ENCOUNTER — Ambulatory Visit (INDEPENDENT_AMBULATORY_CARE_PROVIDER_SITE_OTHER): Admitting: Obstetrics & Gynecology

## 2024-03-01 ENCOUNTER — Encounter (HOSPITAL_BASED_OUTPATIENT_CLINIC_OR_DEPARTMENT_OTHER): Payer: Self-pay | Admitting: Obstetrics & Gynecology

## 2024-03-01 VITALS — BP 132/90 | HR 86 | Wt 251.0 lb

## 2024-03-01 DIAGNOSIS — Z510 Encounter for antineoplastic radiation therapy: Secondary | ICD-10-CM | POA: Diagnosis not present

## 2024-03-01 DIAGNOSIS — M4126 Other idiopathic scoliosis, lumbar region: Secondary | ICD-10-CM

## 2024-03-01 DIAGNOSIS — C712 Malignant neoplasm of temporal lobe: Secondary | ICD-10-CM | POA: Diagnosis not present

## 2024-03-01 DIAGNOSIS — Z1231 Encounter for screening mammogram for malignant neoplasm of breast: Secondary | ICD-10-CM

## 2024-03-01 DIAGNOSIS — C719 Malignant neoplasm of brain, unspecified: Secondary | ICD-10-CM | POA: Diagnosis not present

## 2024-03-01 DIAGNOSIS — M954 Acquired deformity of chest and rib: Secondary | ICD-10-CM | POA: Diagnosis not present

## 2024-03-01 LAB — RAD ONC ARIA SESSION SUMMARY
Course Elapsed Days: 35
Plan Fractions Treated to Date: 3
Plan Prescribed Dose Per Fraction: 2 Gy
Plan Total Fractions Prescribed: 7
Plan Total Prescribed Dose: 14 Gy
Reference Point Dosage Given to Date: 6 Gy
Reference Point Session Dosage Given: 2 Gy
Session Number: 26

## 2024-03-01 NOTE — Progress Notes (Unsigned)
 GYNECOLOGY  VISIT  CC:   possible breast mass  HPI: 68 y.o. [id number] Divorced White or Caucasian female here for breast exam. Patient reports that she feels a mass/swelling on the left rib that she noticed last week. Last mammogram was 05/2022 so is due.  No recent trauma or nipple discharge.    Pt underwent surgery 12/29/2023 for removal of glioma.  Has final radiation this week.  Other then fatigue, she is going well.  Daughter came home from [place]  to help.  She will go back to moved out of her placed there to be in [place] for the next while.   MEDS:   Current Outpatient Medications on File Prior to Visit  Medication Sig Dispense Refill   aspirin  325 MG tablet Take 325 mg by mouth daily.     butalbital -acetaminophen -caffeine  (FIORICET ) 50-325-40 MG tablet Take 2 tablets by mouth every 4 (four) hours as needed for headache (moderate headache). 60 tablet 0   cholecalciferol  (VITAMIN D ) 1000 UNITS tablet Take 2,000 Units by mouth daily.      cyanocobalamin  (VITAMIN B12) 1000 MCG tablet Take 1,000 mcg by mouth daily.     fluticasone  (FLONASE ) 50 MCG/ACT nasal spray Place 1 spray into both nostrils in the morning and at bedtime. 16 g 6   furosemide  (LASIX ) 40 MG tablet Take 1 tablet (40 mg total) by mouth daily. 90 tablet 1   losartan  (COZAAR ) 100 MG tablet Take 1 tablet (100 mg total) by mouth daily. 90 tablet 3   Lutein 6 MG CAPS Take 6 mg by mouth daily.     metoprolol  succinate (TOPROL -XL) 100 MG 24 hr tablet Take 1 tablet (100 mg total) by mouth daily with or immediately following a meal. 90 tablet 2   Multiple Vitamin (MULTIVITAMIN WITH MINERALS) TABS tablet Take 1 tablet by mouth daily.     ondansetron  (ZOFRAN ) 8 MG tablet Take 1 tablet (8 mg total) by mouth every 8 (eight) hours as needed for nausea or vomiting. May take 30-60 minutes prior to Temodar  administration if nausea/vomiting occurs as needed. 30 tablet 1   potassium chloride  SA (KLOR-CON  M) 20 MEQ tablet Take 1 tablet (20 mEq total)  by mouth daily. 90 tablet 1   prochlorperazine  (COMPAZINE ) 10 MG tablet Take 1 tablet (10 mg total) by mouth every 6 (six) hours as needed for nausea or vomiting. 30 tablet 0   rosuvastatin  (CRESTOR ) 20 MG tablet Take 1 tablet (20 mg total) by mouth daily. 90 tablet 3   temozolomide  (TEMODAR ) 180 MG capsule Take 1 capsule (180 mg total) by mouth daily. May take on an empty stomach to decrease nausea & vomiting. 42 capsule 0   traZODone  (DESYREL ) 50 MG tablet Take 0.5-1 tablets (25-50 mg total) by mouth at bedtime as needed for sleep. 30 tablet 3   No current facility-administered medications on file prior to visit.    ALLERGIES: Ace inhibitors, Dust mite extract, Oxycodone  hcl, Oxycodone -acetaminophen , Tree extract, Vicodin [hydrocodone-acetaminophen ], Victoza [liraglutide], and Adhesive [tape]  SH:  divorced, non smoker  Review of Systems  Constitutional: Negative.   Respiratory: Negative.    Cardiovascular: Negative.     PHYSICAL EXAMINATION:    BP (!) 132/90 (BP Location: Left Arm, Patient Position: Sitting, Cuff Size: Large)   Pulse 86   Wt 251 lb (113.9 kg)   LMP 05/20/2008   SpO2 100%   BMI 40.51 kg/m     Physical Exam Constitutional:      Appearance: Normal appearance.  Chest:  Breasts:    Right: No swelling, bleeding, inverted nipple, mass, nipple discharge, skin change or tenderness.     Left: No swelling, bleeding, inverted nipple, mass, nipple discharge, skin change or tenderness.       Comments: Sternum is prominent.  Nodular area that pt noticed is a rib.   Lymphadenopathy:     Upper Body:     Right upper body: No supraclavicular, axillary or pectoral adenopathy.     Left upper body: No supraclavicular, axillary or pectoral adenopathy.  Neurological:     General: No focal deficit present.     Mental Status: She is alert.  Psychiatric:        Mood and Affect: Mood normal.        Behavior: Behavior normal.     Chaperone was present for  exam.  Assessment/Plan: 1. Chest wall deformity, acquired (Primary) - area of concern is a more prominent rib, likely due to prominent sternum.  Possibly this is from h/o lumbar scoliosis.  Breast exam is normal.  Pt reassured.  Mammogram is due and order placed today.  Do not feel diagnostic imaging needed at this time.  2. Other idiopathic scoliosis, lumbar region  3. Encounter for screening mammogram for malignant neoplasm of breast - MM 3D SCREENING MAMMOGRAM BILATERAL BREAST; Future

## 2024-03-01 NOTE — Telephone Encounter (Signed)
 Talked with the pt daughter in regards to the pt disability forms being completed,faxed, and confirmation received. Pt daughter stated that she will pick up forms tomorrow. Pt copy was also emailed upon request. No questions or concerns to be noted at this time.

## 2024-03-02 ENCOUNTER — Ambulatory Visit
Admission: RE | Admit: 2024-03-02 | Discharge: 2024-03-02 | Disposition: A | Source: Ambulatory Visit | Attending: Radiation Oncology

## 2024-03-02 ENCOUNTER — Other Ambulatory Visit: Payer: Self-pay

## 2024-03-02 ENCOUNTER — Ambulatory Visit: Admitting: Physical Therapy

## 2024-03-02 ENCOUNTER — Ambulatory Visit

## 2024-03-02 DIAGNOSIS — C719 Malignant neoplasm of brain, unspecified: Secondary | ICD-10-CM | POA: Diagnosis not present

## 2024-03-02 DIAGNOSIS — C712 Malignant neoplasm of temporal lobe: Secondary | ICD-10-CM | POA: Diagnosis not present

## 2024-03-02 DIAGNOSIS — Z510 Encounter for antineoplastic radiation therapy: Secondary | ICD-10-CM | POA: Diagnosis not present

## 2024-03-02 LAB — RAD ONC ARIA SESSION SUMMARY
Course Elapsed Days: 36
Plan Fractions Treated to Date: 4
Plan Prescribed Dose Per Fraction: 2 Gy
Plan Total Fractions Prescribed: 7
Plan Total Prescribed Dose: 14 Gy
Reference Point Dosage Given to Date: 8 Gy
Reference Point Session Dosage Given: 2 Gy
Session Number: 27

## 2024-03-03 ENCOUNTER — Ambulatory Visit
Admission: RE | Admit: 2024-03-03 | Discharge: 2024-03-03 | Disposition: A | Discharge status: Home / Self Care | Source: Ambulatory Visit | Attending: Radiation Oncology | Admitting: Radiation Oncology

## 2024-03-03 ENCOUNTER — Other Ambulatory Visit: Payer: Self-pay

## 2024-03-03 DIAGNOSIS — Z510 Encounter for antineoplastic radiation therapy: Secondary | ICD-10-CM | POA: Diagnosis not present

## 2024-03-03 DIAGNOSIS — C712 Malignant neoplasm of temporal lobe: Secondary | ICD-10-CM | POA: Diagnosis not present

## 2024-03-03 DIAGNOSIS — C719 Malignant neoplasm of brain, unspecified: Secondary | ICD-10-CM | POA: Diagnosis not present

## 2024-03-03 LAB — RAD ONC ARIA SESSION SUMMARY
Course Elapsed Days: 37
Plan Fractions Treated to Date: 5
Plan Prescribed Dose Per Fraction: 2 Gy
Plan Total Fractions Prescribed: 7
Plan Total Prescribed Dose: 14 Gy
Reference Point Dosage Given to Date: 10 Gy
Reference Point Session Dosage Given: 2 Gy
Session Number: 28

## 2024-03-04 ENCOUNTER — Ambulatory Visit (INDEPENDENT_AMBULATORY_CARE_PROVIDER_SITE_OTHER): Admitting: Otolaryngology

## 2024-03-04 ENCOUNTER — Inpatient Hospital Stay: Admitting: Internal Medicine

## 2024-03-04 ENCOUNTER — Inpatient Hospital Stay

## 2024-03-04 ENCOUNTER — Ambulatory Visit
Admission: RE | Admit: 2024-03-04 | Discharge: 2024-03-04 | Disposition: A | Discharge status: Home / Self Care | Source: Ambulatory Visit | Attending: Radiation Oncology | Admitting: Radiation Oncology

## 2024-03-04 ENCOUNTER — Other Ambulatory Visit: Payer: Self-pay

## 2024-03-04 ENCOUNTER — Other Ambulatory Visit: Payer: Self-pay | Admitting: *Deleted

## 2024-03-04 ENCOUNTER — Encounter (INDEPENDENT_AMBULATORY_CARE_PROVIDER_SITE_OTHER): Payer: Self-pay | Admitting: Otolaryngology

## 2024-03-04 ENCOUNTER — Other Ambulatory Visit (HOSPITAL_BASED_OUTPATIENT_CLINIC_OR_DEPARTMENT_OTHER): Payer: Self-pay

## 2024-03-04 VITALS — BP 159/93 | HR 73 | Ht 66.0 in | Wt 255.0 lb

## 2024-03-04 VITALS — BP 120/72 | HR 63 | Temp 97.3°F | Resp 20 | Wt 255.1 lb

## 2024-03-04 DIAGNOSIS — H6121 Impacted cerumen, right ear: Secondary | ICD-10-CM | POA: Diagnosis not present

## 2024-03-04 DIAGNOSIS — H938X1 Other specified disorders of right ear: Secondary | ICD-10-CM

## 2024-03-04 DIAGNOSIS — H6991 Unspecified Eustachian tube disorder, right ear: Secondary | ICD-10-CM

## 2024-03-04 DIAGNOSIS — H6501 Acute serous otitis media, right ear: Secondary | ICD-10-CM

## 2024-03-04 DIAGNOSIS — Z510 Encounter for antineoplastic radiation therapy: Secondary | ICD-10-CM | POA: Diagnosis not present

## 2024-03-04 DIAGNOSIS — C719 Malignant neoplasm of brain, unspecified: Secondary | ICD-10-CM

## 2024-03-04 DIAGNOSIS — C712 Malignant neoplasm of temporal lobe: Secondary | ICD-10-CM | POA: Diagnosis not present

## 2024-03-04 DIAGNOSIS — E11A Type 2 diabetes mellitus without complications in remission: Secondary | ICD-10-CM

## 2024-03-04 LAB — CBC WITH DIFFERENTIAL (CANCER CENTER ONLY)
Abs Immature Granulocytes: 0.01 K/uL (ref 0.00–0.07)
Basophils Absolute: 0 K/uL (ref 0.0–0.1)
Basophils Relative: 1 %
Eosinophils Absolute: 0.2 K/uL (ref 0.0–0.5)
Eosinophils Relative: 5 %
HCT: 40.6 % (ref 36.0–46.0)
Hemoglobin: 13.7 g/dL (ref 12.0–15.0)
Immature Granulocytes: 0 %
Lymphocytes Relative: 11 %
Lymphs Abs: 0.4 K/uL — ABNORMAL LOW (ref 0.7–4.0)
MCH: 29.1 pg (ref 26.0–34.0)
MCHC: 33.7 g/dL (ref 30.0–36.0)
MCV: 86.4 fL (ref 80.0–100.0)
Monocytes Absolute: 0.3 K/uL (ref 0.1–1.0)
Monocytes Relative: 9 %
Neutro Abs: 2.6 K/uL (ref 1.7–7.7)
Neutrophils Relative %: 74 %
Platelet Count: 174 K/uL (ref 150–400)
RBC: 4.7 MIL/uL (ref 3.87–5.11)
RDW: 13.9 % (ref 11.5–15.5)
WBC Count: 3.5 K/uL — ABNORMAL LOW (ref 4.0–10.5)
nRBC: 0 % (ref 0.0–0.2)

## 2024-03-04 LAB — RAD ONC ARIA SESSION SUMMARY
Course Elapsed Days: 38
Plan Fractions Treated to Date: 6
Plan Prescribed Dose Per Fraction: 2 Gy
Plan Total Fractions Prescribed: 7
Plan Total Prescribed Dose: 14 Gy
Reference Point Dosage Given to Date: 12 Gy
Reference Point Session Dosage Given: 2 Gy
Session Number: 29

## 2024-03-04 LAB — CMP (CANCER CENTER ONLY)
ALT: 18 U/L (ref 0–44)
AST: 16 U/L (ref 15–41)
Albumin: 3.8 g/dL (ref 3.5–5.0)
Alkaline Phosphatase: 85 U/L (ref 38–126)
Anion gap: 3 — ABNORMAL LOW (ref 5–15)
BUN: 9 mg/dL (ref 8–23)
CO2: 31 mmol/L (ref 22–32)
Calcium: 9.4 mg/dL (ref 8.9–10.3)
Chloride: 106 mmol/L (ref 98–111)
Creatinine: 0.71 mg/dL (ref 0.44–1.00)
GFR, Estimated: >60 mL/min (ref >60)
Glucose, Bld: 163 mg/dL — ABNORMAL HIGH (ref 70–99)
Potassium: 4.3 mmol/L (ref 3.5–5.1)
Sodium: 140 mmol/L (ref 135–145)
Total Bilirubin: 0.5 mg/dL (ref 0.0–1.2)
Total Protein: 5.9 g/dL — ABNORMAL LOW (ref 6.5–8.1)

## 2024-03-04 MED ORDER — PREDNISONE 20 MG PO TABS
20.0000 mg | ORAL_TABLET | Freq: Every day | ORAL | 0 refills | Status: AC
  Filled 2024-03-04: qty 7, 7d supply, fill #0

## 2024-03-04 NOTE — Progress Notes (Signed)
 [facility] at [facility] [address]  [place], [place] [address] [phone]   Interval Evaluation  Date of Service: 03/04/24 Patient Name: Alexandra Walker Patient MRN: [id number] Patient DOB: 1956-03-28 Provider: Arthea MARLA Manns, MD  Identifying Statement:  Alexandra Walker is a 68 y.o. female with right temporal glioblastoma    Oncologic History: Oncology History  Glioblastoma, IDH-wildtype (HCC)  12/29/2023 Surgery   Right temporal craniotomy, resection with Dr. Rosslyn; path is grade 4 glioma   01/26/2024 - 03/05/2024 Radiation Therapy   6 weeks IMRT, concurrent Temozolomide  75mg /m2 with Dr. Izell      Interval History: [name] Pouch presents today for follow up, now in week #6 of radiation and Temodar .  She continues to tolerate treatment well so far without any signficant issues.  Remains independent with gait, functional status.  No recurrence of UTI symptoms now having completed the antibiotics.  Denies seizures, headaches.  H+P (01/08/24) Patient presented to neurologic attention with several days history of difficulty driving/navigating, some disorganization at work, and headaches.  CNS imaging demonstrated an enhancing mass within the right temporal lobe, c/w primary brain tumor.  She underwent craniotomy, resection with Dr. Rosslyn on 12/29/23; pathology demonstrated grade 4 glioma, IDH pending.  Since surgery, she feels closer to her prior baseline, independent with gait.  Decadron  is at 4mg  twice per day, which has caused some insomnia and elevated blood sugar.  Still on post-op Keppra .    Medications: Current Outpatient Medications on File Prior to Visit  Medication Sig Dispense Refill   aspirin  325 MG tablet Take 325 mg by mouth daily.     butalbital -acetaminophen -caffeine  (FIORICET ) 50-325-40 MG tablet Take 2 tablets by mouth every 4 (four) hours as needed for headache (moderate headache). 60 tablet 0   cholecalciferol  (VITAMIN D )  1000 UNITS tablet Take 2,000 Units by mouth daily.      cyanocobalamin  (VITAMIN B12) 1000 MCG tablet Take 1,000 mcg by mouth daily.     fluticasone  (FLONASE ) 50 MCG/ACT nasal spray Place 1 spray into both nostrils in the morning and at bedtime. 16 g 6   furosemide  (LASIX ) 40 MG tablet Take 1 tablet (40 mg total) by mouth daily. 90 tablet 1   losartan  (COZAAR ) 100 MG tablet Take 1 tablet (100 mg total) by mouth daily. 90 tablet 3   Lutein 6 MG CAPS Take 6 mg by mouth daily.     metoprolol  succinate (TOPROL -XL) 100 MG 24 hr tablet Take 1 tablet (100 mg total) by mouth daily with or immediately following a meal. 90 tablet 2   Multiple Vitamin (MULTIVITAMIN WITH MINERALS) TABS tablet Take 1 tablet by mouth daily.     ondansetron  (ZOFRAN ) 8 MG tablet Take 1 tablet (8 mg total) by mouth every 8 (eight) hours as needed for nausea or vomiting. May take 30-60 minutes prior to Temodar  administration if nausea/vomiting occurs as needed. 30 tablet 1   potassium chloride  SA (KLOR-CON  M) 20 MEQ tablet Take 1 tablet (20 mEq total) by mouth daily. 90 tablet 1   prochlorperazine  (COMPAZINE ) 10 MG tablet Take 1 tablet (10 mg total) by mouth every 6 (six) hours as needed for nausea or vomiting. 30 tablet 0   rosuvastatin  (CRESTOR ) 20 MG tablet Take 1 tablet (20 mg total) by mouth daily. 90 tablet 3   temozolomide  (TEMODAR ) 180 MG capsule Take 1 capsule (180 mg total) by mouth daily. May take on an empty stomach to decrease nausea & vomiting. 42  capsule 0   traZODone  (DESYREL ) 50 MG tablet Take 0.5-1 tablets (25-50 mg total) by mouth at bedtime as needed for sleep. 30 tablet 3   No current facility-administered medications on file prior to visit.    Allergies:  Allergies  Allergen Reactions   Ace Inhibitors Other (See Comments)    coughing   Dust Mite Extract Other (See Comments)    Sneezing and congestion   Oxycodone  Hcl Other (See Comments) and Nausea And Vomiting    NDC Rniz:[id number]    Oxycodone -Acetaminophen  Nausea And Vomiting   Tree Extract Other (See Comments)    Tree pollen and scrub pollen    Vicodin [Hydrocodone-Acetaminophen ] Nausea And Vomiting   Victoza [Liraglutide] Other (See Comments)    pancreatitis   Adhesive [Tape] Rash    Not on stomach   Past Medical History:  Past Medical History:  Diagnosis Date   ADD (attention deficit disorder)    Allergy    Anticardiolipin syndrome    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   lasted about 6 hours, Echo did not show anything   Bilateral swelling of feet    Cholelithiasis    Chronic depressive disorder    Chronic diastolic CHF (congestive heart failure) (HCC)    Circadian rhythm sleep disorder, shift work    DDD (degenerative disc disease), 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   Diabetes (HCC)    Diverticulosis    Minimal   Fatty liver    GERD (gastroesophageal reflux disease)    Heart murmur    Hepatic steatosis    History of hiatal hernia 2017   Small   HLA B27 (HLA B27 positive)    Hx of pancreatitis    Hyperlipidemia    Hypertension    IBS (irritable bowel syndrome)    Ischemic colitis    Lactose intolerance    Lumbar scoliosis 2010   Mild   Migraine headache without aura 05/10/2015   Multiple thyroid  nodules    Nephrolithiasis 2014   Left, minimal   Obesity due to excess calories with serious comorbidity 05/24/2019   Palpitations    PONV (postoperative nausea and vomiting)    Prediabetes    Presbyopia    Right bundle branch block    Sepsis (HCC) 01/2016   Sleep apnea    Sleep hypopnea    Vitamin D  deficiency    Past Surgical History:  Past Surgical History:  Procedure Laterality Date   APPLICATION OF CRANIAL NAVIGATION N/A 12/29/2023   Procedure: COMPUTER-ASSISTED NAVIGATION, FOR CRANIAL PROCEDURE;  Surgeon: Rosslyn Dino HERO, MD;  Location: [facility] OR;  Service: Neurosurgery;  Laterality: N/A;   BREAST BIOPSY Left    CERVICAL CONE BIOPSY     CHOLECYSTECTOMY N/A 10/26/2014   Procedure: LAPAROSCOPIC CHOLECYSTECTOMY;   Surgeon: Vicenta Poli, MD;  Location: [facility];  Service: General;  Laterality: N/A;   COLONOSCOPY W/ BIOPSIES  multiple   CRANIOTOMY Right 12/29/2023   Procedure: CRANIOTOMY TUMOR EXCISION;  Surgeon: Rosslyn Dino HERO, MD;  Location: [facility] OR;  Service: Neurosurgery;  Laterality: Right;   CYSTOSCOPY WITH RETROGRADE PYELOGRAM, URETEROSCOPY AND STENT PLACEMENT Left 06/26/2018   Procedure: CYSTOSCOPY WITH RETROGRADE PYELOGRAM, URETEROSCOPY AND STENT PLACEMENT;  Surgeon: Alvaro Hummer, MD;  Location: [facility];  Service: Urology;  Laterality: Left;   DIAGNOSTIC LAPAROSCOPY     DILATION AND CURETTAGE OF UTERUS  11/2009   DUPUYTREN CONTRACTURE RELEASE Left    x 3 , Dr Camella   ESOPHAGOGASTRODUODENOSCOPY  multiple   EYE SURGERY  HOLMIUM LASER APPLICATION Left 06/26/2018   Procedure: HOLMIUM LASER APPLICATION;  Surgeon: Alvaro Hummer, MD;  Location: [facility];  Service: Urology;  Laterality: Left;   KNEE ARTHROSCOPY Left 11/2008   LAPAROSCOPIC VAGINAL HYSTERECTOMY WITH SALPINGO OOPHORECTOMY Bilateral 10/03/2015   Procedure: LAPAROSCOPIC ASSISTED VAGINAL HYSTERECTOMY WITH SALPINGECTOMY;  Surgeon: Shanda SHAUNNA Muscat, MD;  Location: [facility];  Service: Gynecology;  Laterality: Bilateral;   LASIK  1990   RK   LEFT FINGER SURGERY     LIVER BIOPSY     MASS EXCISION Left 12/24/2012   Procedure: LEFT EXCISION OF PALMAR MASS X TWO;  Surgeon: Elsie Mussel, MD;  Location: [facility];  Service: Orthopedics;  Laterality: Left;   PHOTOREFRACTIVE KERATOTOMY  1994   RADIAL KERATOTOMY  1994   TRANSOBTURATOR SLING     Social History:  Social History   Socioeconomic History   Marital status: Divorced    Spouse name: Not on file   Number of children: 1   Years of education: Not on file   Highest education level: Master's degree (e.g., MA, MS, MEng, MEd, MSW, MBA)  Occupational History   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   Social Drivers of Health   Financial Resource Strain: Low Risk  (02/25/2024)   Overall Financial Resource Strain (CARDIA)    Difficulty of Paying Living Expenses: Not hard at all  Food Insecurity: No Food Insecurity (02/25/2024)   Hunger Vital Sign    Worried About Running Out of Food in the Last Year: Never true    Ran Out of Food in the Last Year: Never true  Transportation Needs: No Transportation Needs (02/25/2024)   PRAPARE - Administrator, Civil Service (Medical): No    Lack of Transportation (Non-Medical): No  Physical Activity: Insufficiently Active (02/25/2024)   Exercise Vital Sign    Days of Exercise per Week: 2 days    Minutes of Exercise per Session: 20 min  Stress: No Stress Concern Present (02/25/2024)   [employer] of Occupational Health - Occupational Stress Questionnaire    Feeling of Stress: Only a little  Social Connections: Moderately Integrated (02/25/2024)   Social Connection and Isolation Panel    Frequency of Communication with Friends and Family: More than three times a week    Frequency of Social Gatherings with Friends and Family: Once a week    Attends Religious Services: 1 to 4 times per year    Active Member of [employer] or Organizations: Yes    Attends Banker Meetings: 1 to 4 times per year    Marital Status: Divorced  Recent Concern: Social Connections - Moderately Isolated (12/27/2023)   Social Connection and Isolation Panel    Frequency of Communication with Friends and Family: More  than three times a week    Frequency of Social Gatherings with Friends and Family: Once a week    Attends Religious Services: More than 4 times per year  Active Member of Clubs or Organizations: No    Attends Banker Meetings: Never    Marital Status: Divorced  Catering manager Violence: Not At Risk (01/09/2024)   Humiliation, Afraid, Rape, and Kick questionnaire    Fear of Current or Ex-Partner: No    Emotionally Abused: No    Physically Abused: No    Sexually Abused: No   Family History:  Family History  Problem Relation Age of Onset   Coronary artery disease Mother    Hypertension Mother    Heart disease Mother    Heart attack Mother    Diabetes Mother    Hyperlipidemia Mother    Kidney disease Mother    Depression Mother    Sleep apnea Mother    Obesity Mother    Heart attack Father    Heart disease Father    Sudden death Father    Hypertension Sister    Hypertension Brother    Colon cancer Neg Hx    Colon polyps Neg Hx    Esophageal cancer Neg Hx    Rectal cancer Neg Hx    Stomach cancer Neg Hx     Review of Systems: Constitutional: Doesn't report fevers, chills or abnormal weight loss Eyes: Doesn't report blurriness of vision Ears, nose, mouth, throat, and face: Doesn't report sore throat Respiratory: Doesn't report cough, dyspnea or wheezes Cardiovascular: Doesn't report palpitation, chest discomfort  Gastrointestinal:  Doesn't report nausea, constipation, diarrhea GU: Doesn't report incontinence Skin: Doesn't report skin rashes Neurological: Per HPI Musculoskeletal: Doesn't report joint pain Behavioral/Psych: Doesn't report anxiety  Physical Exam: Vitals:   03/04/24 0915  BP: 120/72  Pulse: 63  Resp: 20  Temp: (!) 97.3 F (36.3 C)  SpO2: 98%    KPS: 90. General: Alert, cooperative, pleasant, in no acute distress Head: Normal EENT: No conjunctival injection or scleral icterus.  Lungs: Resp effort normal Cardiac: Regular  rate Abdomen: Non-distended abdomen Skin: No rashes cyanosis or petechiae. Extremities: No clubbing or edema  Neurologic Exam: Mental Status: Awake, alert, attentive to examiner. Oriented to self and environment. Language is fluent with intact comprehension.  Cranial Nerves: Visual acuity is grossly normal. Visual fields are full. Extra-ocular movements intact. No ptosis. Face is symmetric Motor: Tone and bulk are normal. Power is full in both arms and legs. Reflexes are symmetric, no pathologic reflexes present.  Sensory: Intact to light touch Gait: Normal.   Labs: I have reviewed the data as listed    Component Value Date/Time   NA 140 03/04/2024 0829   NA 139 11/26/2023 1155   K 4.3 03/04/2024 0829   CL 106 03/04/2024 0829   CO2 31 03/04/2024 0829   GLUCOSE 163 (H) 03/04/2024 0829   BUN 9 03/04/2024 0829   BUN 16 11/26/2023 1155   CREATININE 0.71 03/04/2024 0829   CREATININE 0.76 05/05/2020 1631   CALCIUM  9.4 03/04/2024 0829   PROT 5.9 (L) 03/04/2024 0829   PROT 6.3 09/03/2021 1219   ALBUMIN 3.8 03/04/2024 0829   ALBUMIN 4.1 09/03/2021 1219   AST 16 03/04/2024 0829   ALT 18 03/04/2024 0829   ALKPHOS 85 03/04/2024 0829   BILITOT 0.5 03/04/2024 0829   GFRNONAA >60 03/04/2024 0829   GFRAA >60 04/20/2018 1323   Lab Results  Component Value Date   WBC 3.5 (L) 03/04/2024   NEUTROABS 2.6 03/04/2024   HGB 13.7 03/04/2024   HCT 40.6 03/04/2024   MCV 86.4 03/04/2024   PLT 174 03/04/2024  Assessment/Plan Glioblastoma, IDH-wildtype (HCC)  [name] Pouch is clinically stable today, now in final week of 6 weeks of IMRT and concurrent Temodar .  Labs are within normal limits today.  We ultimately recommended completing course of intensity modulated radiation therapy and concurrent daily Temozolomide .  Radiation will be administered Mon-Fri over 6 weeks, Temodar  will be dosed at 75mg /m2 to be given daily over 42 days.  We reviewed side effects of temodar , including  fatigue, nausea/vomiting, constipation, and cytopenias.  Chemotherapy should be held for the following:  ANC less than 1,000  Platelets less than 100,000  LFT or creatinine greater than 2x ULN  If clinical concerns/contraindications develop  Should remain off decadron  if tolerated.  We ask that SHANEESE TAIT return to clinic in 1 months following post-RT brain MRI, or sooner as needed.  All questions were answered. The patient knows to call the clinic with any problems, questions or concerns. No barriers to learning were detected.  The total time spent in the encounter was 30 minutes and more than 50% was on counseling and review of test results   Arthea MARLA Manns, MD Medical Director of Neuro-Oncology [facility] at [facility] 03/04/24 9:25 AM

## 2024-03-04 NOTE — Progress Notes (Signed)
 Dear Dr. Izell, Here is my assessment for our mutual patient, Alexandra Walker. Thank you for allowing me the opportunity to care for your patient. Please do not hesitate to contact me should you have any other questions. Sincerely, Dr. Eldora Blanch  Otolaryngology Clinic Note Referring provider: Dr. Izell HPI:  Initial visit (02/2024): Discussed the use of AI scribe software for clinical note transcription with the patient, who gave verbal consent to proceed.  History of Present Illness Alexandra Walker is a 68 year old female with brain cancer who presents with muffling and fullness in her right ear. She was referred by Dr. Izell for evaluation of ear symptoms following radiation treatment.  Muffling and fullness in the right ear began during the first week of radiation treatment, with a sensation of fluid and a 'swooshing' sound when tilting her head. Symptoms have worsened, causing mild pain and muffling on the right side. There is no ear drainage, tinnitus, or vertigo.  Three to four years ago, she experienced an eardrum rupture due to fluid buildup, treated with antibiotics and ear drops. She is unsure if it was the same ear. She has no history of ear infections or surgeries but has had occasional wax buildup causing muffled hearing, resolved with cleaning.  Patient additionally denies history of: deep pain in ear canal, eustachian tube symptoms such as popping, crackling, sensitive to pressure changes Patient also denies barotrauma, vestibular suppressant use, ototoxic medication use  Current medications include Sudafed and Afrin nasal spray, used to aid sleep due to nasal CPAP use, and intermittent saline sprays. She has a chronic nasal congestion but no recent sinus infections requiring antibiotics or steroids.  Personal or FHx of bleeding dz or anesthesia difficulty: no   Tobacco: no  PMHx: Glioma of right temporal lobe, HTN, DM, NAFLD, OSA  Independent Review of Additional  Tests or Records:  Dr. 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 Noted persistent headaches and dizziness, MRI brain done. Noted fullness in both ears, due to get ears cleaned - noted some disequilibrium. Dx: Ear fullness and in Oct ref to ENT Dr. Ethyl 02/12/2021: cerumen impaction; noted h/o left ear shingles; bruxism; Dx: cerumen impaction; Rx: cleaned CBC and CMP 03/04/2024: WBC 3.5, Eos 200; BUN/Cr 9/0.71 CTH 01/24/2024 independently interpreted with respect to sinuses and ears: noted mastoids and ME well aerated but cuts thick so suboptimal study; some b/l ethmoid and OMC opacification but otherwise paranasal sinuses generally clear PMH/Meds/All/SocHx/FamHx/ROS:   Past Medical History:  Diagnosis Date   ADD (attention deficit disorder)    Allergy    Anticardiolipin syndrome    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   lasted about 6 hours, Echo did not show anything   Bilateral swelling of feet    Cholelithiasis    Chronic depressive disorder    Chronic diastolic CHF (congestive heart failure) (HCC)    Circadian rhythm sleep disorder, shift work    DDD (degenerative disc disease), 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   Diabetes (HCC)    Diverticulosis    Minimal   Fatty liver    GERD (gastroesophageal reflux disease)    Heart murmur    Hepatic steatosis    History of hiatal hernia 2017   Small   HLA B27 (HLA B27 positive)    Hx of pancreatitis    Hyperlipidemia    Hypertension    IBS (irritable bowel syndrome)    Ischemic colitis    Lactose intolerance    Lumbar scoliosis 2010   Mild  Migraine headache without aura 05/10/2015   Multiple thyroid  nodules    Nephrolithiasis 2014   Left, minimal   Obesity due to excess calories with serious comorbidity 05/24/2019   Palpitations    PONV (postoperative nausea and vomiting)    Prediabetes    Presbyopia    Right bundle branch block    Sepsis (HCC) 01/2016   Sleep apnea    Sleep hypopnea    Vitamin D  deficiency       Past Surgical History:  Procedure Laterality Date   APPLICATION OF CRANIAL NAVIGATION N/A 12/29/2023   Procedure: COMPUTER-ASSISTED NAVIGATION, FOR CRANIAL PROCEDURE;  Surgeon: Rosslyn Dino HERO, MD;  Location: [facility] OR;  Service: Neurosurgery;  Laterality: N/A;   BREAST BIOPSY Left    CERVICAL CONE BIOPSY     CHOLECYSTECTOMY N/A 10/26/2014   Procedure: LAPAROSCOPIC CHOLECYSTECTOMY;  Surgeon: Vicenta Poli, MD;  Location: [facility];  Service: General;  Laterality: N/A;   COLONOSCOPY W/ BIOPSIES  multiple   CRANIOTOMY Right 12/29/2023   Procedure: CRANIOTOMY TUMOR EXCISION;  Surgeon: Rosslyn Dino HERO, MD;  Location: [facility] OR;  Service: Neurosurgery;  Laterality: Right;   CYSTOSCOPY WITH RETROGRADE PYELOGRAM, URETEROSCOPY AND STENT PLACEMENT Left 06/26/2018   Procedure: CYSTOSCOPY WITH RETROGRADE PYELOGRAM, URETEROSCOPY AND STENT PLACEMENT;  Surgeon: Alvaro Hummer, MD;  Location: [facility];  Service: Urology;  Laterality: Left;   DIAGNOSTIC LAPAROSCOPY     DILATION AND CURETTAGE OF UTERUS  11/2009   DUPUYTREN CONTRACTURE RELEASE Left    x 3 , Dr Camella   ESOPHAGOGASTRODUODENOSCOPY  multiple   EYE SURGERY     HOLMIUM LASER APPLICATION Left 06/26/2018   Procedure: HOLMIUM LASER APPLICATION;  Surgeon: Alvaro Hummer, MD;  Location: [facility];  Service: Urology;  Laterality: Left;   KNEE ARTHROSCOPY Left 11/2008   LAPAROSCOPIC VAGINAL HYSTERECTOMY WITH SALPINGO OOPHORECTOMY Bilateral 10/03/2015   Procedure: LAPAROSCOPIC ASSISTED VAGINAL HYSTERECTOMY WITH SALPINGECTOMY;  Surgeon: Shanda SHAUNNA Muscat, MD;  Location: [facility];  Service: Gynecology;  Laterality: Bilateral;   LASIK  1990   RK   LEFT FINGER SURGERY     LIVER BIOPSY     MASS EXCISION Left 12/24/2012   Procedure: LEFT EXCISION OF PALMAR MASS X TWO;  Surgeon: Elsie Camella, MD;  Location: [facility];  Service: Orthopedics;  Laterality: Left;   PHOTOREFRACTIVE KERATOTOMY   1994   RADIAL KERATOTOMY  1994   TRANSOBTURATOR SLING      Family History  Problem Relation Age of Onset   Coronary artery disease Mother    Hypertension Mother    Heart disease Mother    Heart attack Mother    Diabetes Mother    Hyperlipidemia Mother    Kidney disease Mother    Depression Mother    Sleep apnea Mother    Obesity Mother    Heart attack Father    Heart disease Father    Sudden death Father    Hypertension Sister    Hypertension Brother    Colon cancer Neg Hx    Colon polyps Neg Hx    Esophageal cancer Neg Hx    Rectal cancer Neg Hx    Stomach cancer Neg Hx      Social Connections: Moderately Integrated (02/25/2024)   Social Connection and Isolation Panel    Frequency of Communication with Friends and Family: More than three times a week    Frequency of Social Gatherings with Friends and Family: Once a week    Attends  Religious Services: 1 to 4 times per year    Active Member of Clubs or Organizations: Yes    Attends Banker Meetings: 1 to 4 times per year    Marital Status: Divorced  Recent Concern: Social Connections - Moderately Isolated (12/27/2023)   Social Connection and Isolation Panel    Frequency of Communication with Friends and Family: More than three times a week    Frequency of Social Gatherings with Friends and Family: Once a week    Attends Religious Services: More than 4 times per year    Active Member of [employer] or Organizations: No    Attends Banker Meetings: Never    Marital Status: Divorced      Current Outpatient Medications:    aspirin  325 MG tablet, Take 325 mg by mouth daily., Disp: , Rfl:    butalbital -acetaminophen -caffeine  (FIORICET ) 50-325-40 MG tablet, Take 2 tablets by mouth every 4 (four) hours as needed for headache (moderate headache)., Disp: 60 tablet, Rfl: 0   cholecalciferol  (VITAMIN D ) 1000 UNITS tablet, Take 2,000 Units by mouth daily. , Disp: , Rfl:    cyanocobalamin  (VITAMIN B12) 1000 MCG  tablet, Take 1,000 mcg by mouth daily., Disp: , Rfl:    fluticasone  (FLONASE ) 50 MCG/ACT nasal spray, Place 1 spray into both nostrils in the morning and at bedtime., Disp: 16 g, Rfl: 6   furosemide  (LASIX ) 40 MG tablet, Take 1 tablet (40 mg total) by mouth daily., Disp: 90 tablet, Rfl: 1   losartan  (COZAAR ) 100 MG tablet, Take 1 tablet (100 mg total) by mouth daily., Disp: 90 tablet, Rfl: 3   Lutein 6 MG CAPS, Take 6 mg by mouth daily., Disp: , Rfl:    metoprolol  succinate (TOPROL -XL) 100 MG 24 hr tablet, Take 1 tablet (100 mg total) by mouth daily with or immediately following a meal., Disp: 90 tablet, Rfl: 2   Multiple Vitamin (MULTIVITAMIN WITH MINERALS) TABS tablet, Take 1 tablet by mouth daily., Disp: , Rfl:    ondansetron  (ZOFRAN ) 8 MG tablet, Take 1 tablet (8 mg total) by mouth every 8 (eight) hours as needed for nausea or vomiting. May take 30-60 minutes prior to Temodar  administration if nausea/vomiting occurs as needed., Disp: 30 tablet, Rfl: 1   potassium chloride  SA (KLOR-CON  M) 20 MEQ tablet, Take 1 tablet (20 mEq total) by mouth daily., Disp: 90 tablet, Rfl: 1   predniSONE  (DELTASONE ) 20 MG tablet, Take 1 tablet (20 mg total) by mouth daily with breakfast for 7 days., Disp: 7 tablet, Rfl: 0   prochlorperazine  (COMPAZINE ) 10 MG tablet, Take 1 tablet (10 mg total) by mouth every 6 (six) hours as needed for nausea or vomiting., Disp: 30 tablet, Rfl: 0   rosuvastatin  (CRESTOR ) 20 MG tablet, Take 1 tablet (20 mg total) by mouth daily., Disp: 90 tablet, Rfl: 3   temozolomide  (TEMODAR ) 180 MG capsule, Take 1 capsule (180 mg total) by mouth daily. May take on an empty stomach to decrease nausea & vomiting., Disp: 42 capsule, Rfl: 0   traZODone  (DESYREL ) 50 MG tablet, Take 0.5-1 tablets (25-50 mg total) by mouth at bedtime as needed for sleep., Disp: 30 tablet, Rfl: 3   Physical Exam:   BP (!) 159/93 (BP Location: Right Arm, Patient Position: Sitting, Cuff Size: Large)   Pulse 73   Ht 5' 6  (1.676 m)   Wt 255 lb (115.7 kg)   LMP 05/20/2008   SpO2 94%   BMI 41.16 kg/m   Salient findings:  CN II-XII intact Given history and complaints, ear microscopy was indicated and performed for evaluation with findings as below in physical exam section and in procedures;  Right ear cerumen impaction; after clearance, bilateral EAC clear and TM intact with right posterior serous effusion, query air bubble left but no effusion Weber 512: AD Rinne 512: AC > BC b/l  Anterior rhinoscopy: Septum intact; no purulence No lesions of oral cavity/oropharynx; No obviously palpable neck masses/lymphadenopathy/thyromegaly No respiratory distress or stridor  Seprately Identifiable Procedures:  Prior to initiating any procedures, risks/benefits/alternatives were explained to the patient and verbal consent obtained. Procedure: Bilateral ear microscopy and cerumen removal using microscope (CPT [phone]) - Mod 25 Pre-procedure diagnosis: Cerumen impaction right external ears Post-procedure diagnosis: same Indication: Right cerumen impaction; given patient's otologic complaints and history as well as for improved and comprehensive examination of external ear and tympanic membrane, bilateral otologic examination using microscope was performed and impacted cerumen removed  Procedure: Patient was placed semi-recumbent. Both ear canals were examined using the microscope with findings above. Impacted Cerumen removed on right using curette with improvement in EAC examination and patency. Patient tolerated the procedure well.  Impression & Plans:  Liliana Dang is a 68 y.o. female with:  1. Dysfunction of right eustachian tube   2. Right acute serous otitis media, recurrence not specified   3. Sensation of fullness in right ear   4. Impacted cerumen of right ear    Right serous otitis media post-radiation,  with persistent symptoms of muffling, fullness, and decreased hearing. Discussed options including  steroids, INCS, and possible right myringotomy with tube placement. May be at long term risk for ETD She opted for medical management - Prescribe prednisone  20 mg daily for 7 days. - Instruct to use Flonase  twice daily. - Schedule follow-up in 4-6 weeks with audio to assess response. - Educated on ear popping techniques for Eustachian tube function.  See below regarding exact medications prescribed this encounter including dosages and route: Meds ordered this encounter  Medications   predniSONE  (DELTASONE ) 20 MG tablet    Sig: Take 1 tablet (20 mg total) by mouth daily with breakfast for 7 days.    Dispense:  7 tablet    Refill:  0      Thank you for allowing me the opportunity to care for your patient. Please do not hesitate to contact me should you have any other questions.  Sincerely, Eldora Blanch, MD Otolaryngologist (ENT), [facility] Phone: [phone] Fax: [phone]  03/04/2024, 2:00 PM   MDM:  Level 4 - 99204 Complexity/Problems addressed: mod Data complexity: mod - independent review of note, lab, ordering test - Morbidity: mod  - Prescription Drug prescribed or managed: y

## 2024-03-04 NOTE — Progress Notes (Signed)
 Per OV today, patient is completing her 6 weeks of temodar  w/ radiation. Disenrolled.

## 2024-03-05 ENCOUNTER — Telehealth: Payer: Self-pay | Admitting: Internal Medicine

## 2024-03-05 ENCOUNTER — Other Ambulatory Visit: Payer: Self-pay

## 2024-03-05 ENCOUNTER — Ambulatory Visit
Admission: RE | Admit: 2024-03-05 | Discharge: 2024-03-05 | Disposition: A | Source: Ambulatory Visit | Attending: Radiation Oncology

## 2024-03-05 ENCOUNTER — Encounter: Admitting: Family Medicine

## 2024-03-05 DIAGNOSIS — Z510 Encounter for antineoplastic radiation therapy: Secondary | ICD-10-CM | POA: Diagnosis not present

## 2024-03-05 DIAGNOSIS — C712 Malignant neoplasm of temporal lobe: Secondary | ICD-10-CM | POA: Diagnosis not present

## 2024-03-05 DIAGNOSIS — C719 Malignant neoplasm of brain, unspecified: Secondary | ICD-10-CM | POA: Diagnosis not present

## 2024-03-05 LAB — RAD ONC ARIA SESSION SUMMARY
Course Elapsed Days: 39
Plan Fractions Treated to Date: 7
Plan Prescribed Dose Per Fraction: 2 Gy
Plan Total Fractions Prescribed: 7
Plan Total Prescribed Dose: 14 Gy
Reference Point Dosage Given to Date: 14 Gy
Reference Point Session Dosage Given: 2 Gy
Session Number: 30

## 2024-03-05 NOTE — Telephone Encounter (Signed)
 Scheduled next appointment.  called and spoke with the patients daughter, she is aware.

## 2024-03-08 ENCOUNTER — Ambulatory Visit: Admitting: Occupational Therapy

## 2024-03-08 ENCOUNTER — Inpatient Hospital Stay: Admitting: Internal Medicine

## 2024-03-08 ENCOUNTER — Inpatient Hospital Stay

## 2024-03-08 NOTE — Radiation Completion Notes (Signed)
 Patient Name: Alexandra Walker, LEAVEY MRN: [id number] Date of Birth: Mar 30, 1956 Referring Physician: ARTHEA MANNS, M.D. Date of Service: 2024-03-08 Radiation Oncologist: Lauraine Golden, M.D. [facility]                             RADIATION ONCOLOGY END OF TREATMENT NOTE     Diagnosis: C71.2 Malignant neoplasm of temporal lobe Intent: Curative     ==========DELIVERED PLANS==========  First Treatment Date: 2024-01-26 Last Treatment Date: 2024-03-05   Plan Name: Brain_R_Temp Site: Temporal Lobe Technique: IMRT Mode: Photon Dose Per Fraction: 2 Gy Prescribed Dose (Delivered / Prescribed): 46 Gy / 46 Gy Prescribed Fxs (Delivered / Prescribed): 23 / 23   Plan Name: Brain_Bst_R Site: Temporal Lobe Technique: IMRT Mode: Photon Dose Per Fraction: 2 Gy Prescribed Dose (Delivered / Prescribed): 14 Gy / 14 Gy Prescribed Fxs (Delivered / Prescribed): 7 / 7     ==========ON TREATMENT VISIT DATES========== 2024-01-26, 2024-01-27, 2024-02-02, 2024-02-09, 2024-02-16, 2024-02-23, 2024-03-01     ==========UPCOMING VISITS========== 05/26/2024 [facility] VISIT 30 Whitfield Raisin, NP  04/20/2024 [facility] ADULT HEARING EVAL Leroux-Martinez, Kathlyne Loud, AUD  04/20/2024 [facility] FOLLOW UP Tobie Eldora NOVAK, MD  04/06/2024 [facility] FOLLOW UP 30 Wyatt Leeroy HERO, [place]  04/05/2024 [facility] ONCOLOGY EST PT 30 MANNS ARTHEA POUR, MD  04/01/2024 [facility] MR BRAIN W WO CONTRAST [facility]-MR 1  03/18/2024 [facility]MAMMOGRAPHY MM [facility] SCREENING 20       [facility]-MM 1        ==========APPENDIX - ON TREATMENT VISIT NOTES==========   See weekly On Treatment Notes in Epic for details in the Media tab (listed as Progress notes on the On Treatment Visit Dates listed above).

## 2024-03-10 ENCOUNTER — Other Ambulatory Visit (HOSPITAL_BASED_OUTPATIENT_CLINIC_OR_DEPARTMENT_OTHER): Payer: Self-pay

## 2024-03-10 NOTE — Telephone Encounter (Signed)
 Form passed up to front desk with note to fax OP Note to [facility] and fax form to Dr. Eward office.

## 2024-03-15 ENCOUNTER — Encounter: Payer: Self-pay | Admitting: Internal Medicine

## 2024-03-16 ENCOUNTER — Telehealth: Payer: Self-pay

## 2024-03-16 ENCOUNTER — Encounter: Payer: Self-pay | Admitting: *Deleted

## 2024-03-16 DIAGNOSIS — L814 Other melanin hyperpigmentation: Secondary | ICD-10-CM | POA: Diagnosis not present

## 2024-03-16 DIAGNOSIS — L578 Other skin changes due to chronic exposure to nonionizing radiation: Secondary | ICD-10-CM | POA: Diagnosis not present

## 2024-03-16 DIAGNOSIS — L570 Actinic keratosis: Secondary | ICD-10-CM | POA: Diagnosis not present

## 2024-03-16 NOTE — Telephone Encounter (Signed)
 FMLA Request for additional information was faxed and emailed back to the [place]. RN direct line listed on Paperwork for any further questions related to documents sent.

## 2024-03-17 ENCOUNTER — Encounter: Payer: Self-pay | Admitting: Internal Medicine

## 2024-03-18 ENCOUNTER — Telehealth: Payer: Self-pay

## 2024-03-18 ENCOUNTER — Ambulatory Visit (HOSPITAL_BASED_OUTPATIENT_CLINIC_OR_DEPARTMENT_OTHER)

## 2024-03-18 ENCOUNTER — Encounter: Payer: Self-pay | Admitting: *Deleted

## 2024-03-18 NOTE — Telephone Encounter (Signed)
 Spoke with pt daughter about her moms FMLA form. To let her know that we only received one copy from [employer] which came in today from the pt MyChart. The daughter stated that she will let her mom know.

## 2024-03-26 ENCOUNTER — Telehealth: Payer: Self-pay

## 2024-03-26 NOTE — Telephone Encounter (Signed)
 Notified the pt daughter regarding her completed forms. Pt has their copy.

## 2024-04-01 ENCOUNTER — Ambulatory Visit (HOSPITAL_COMMUNITY)
Admission: RE | Admit: 2024-04-01 | Discharge: 2024-04-01 | Disposition: A | Discharge status: Home / Self Care | Source: Ambulatory Visit | Attending: Internal Medicine | Admitting: Internal Medicine

## 2024-04-01 DIAGNOSIS — G936 Cerebral edema: Secondary | ICD-10-CM | POA: Diagnosis not present

## 2024-04-01 DIAGNOSIS — R9082 White matter disease, unspecified: Secondary | ICD-10-CM | POA: Diagnosis not present

## 2024-04-01 DIAGNOSIS — C719 Malignant neoplasm of brain, unspecified: Secondary | ICD-10-CM | POA: Diagnosis not present

## 2024-04-01 MED ORDER — GADOBUTROL 1 MMOL/ML IV SOLN
10.0000 mL | Freq: Once | INTRAVENOUS | Status: AC | PRN
  Administered 2024-04-01: 10 mL via INTRAVENOUS

## 2024-04-05 ENCOUNTER — Inpatient Hospital Stay: Attending: Internal Medicine | Admitting: Internal Medicine

## 2024-04-05 ENCOUNTER — Other Ambulatory Visit (HOSPITAL_COMMUNITY): Payer: Self-pay

## 2024-04-05 ENCOUNTER — Telehealth: Payer: Self-pay | Admitting: Pharmacist

## 2024-04-05 ENCOUNTER — Other Ambulatory Visit: Payer: Self-pay

## 2024-04-05 ENCOUNTER — Telehealth: Payer: Self-pay | Admitting: Radiation Oncology

## 2024-04-05 VITALS — BP 134/78 | HR 71 | Temp 97.5°F | Resp 16 | Ht 66.0 in | Wt 255.6 lb

## 2024-04-05 DIAGNOSIS — Z8719 Personal history of other diseases of the digestive system: Secondary | ICD-10-CM | POA: Diagnosis not present

## 2024-04-05 DIAGNOSIS — Z8349 Family history of other endocrine, nutritional and metabolic diseases: Secondary | ICD-10-CM | POA: Insufficient documentation

## 2024-04-05 DIAGNOSIS — G4730 Sleep apnea, unspecified: Secondary | ICD-10-CM | POA: Diagnosis not present

## 2024-04-05 DIAGNOSIS — Z833 Family history of diabetes mellitus: Secondary | ICD-10-CM | POA: Diagnosis not present

## 2024-04-05 DIAGNOSIS — C719 Malignant neoplasm of brain, unspecified: Secondary | ICD-10-CM

## 2024-04-05 DIAGNOSIS — Z90722 Acquired absence of ovaries, bilateral: Secondary | ICD-10-CM | POA: Insufficient documentation

## 2024-04-05 DIAGNOSIS — Z90710 Acquired absence of both cervix and uterus: Secondary | ICD-10-CM | POA: Insufficient documentation

## 2024-04-05 DIAGNOSIS — G4700 Insomnia, unspecified: Secondary | ICD-10-CM | POA: Diagnosis not present

## 2024-04-05 DIAGNOSIS — Z7982 Long term (current) use of aspirin: Secondary | ICD-10-CM | POA: Insufficient documentation

## 2024-04-05 DIAGNOSIS — Z923 Personal history of irradiation: Secondary | ICD-10-CM | POA: Insufficient documentation

## 2024-04-05 DIAGNOSIS — Z83438 Family history of other disorder of lipoprotein metabolism and other lipidemia: Secondary | ICD-10-CM | POA: Insufficient documentation

## 2024-04-05 DIAGNOSIS — E1165 Type 2 diabetes mellitus with hyperglycemia: Secondary | ICD-10-CM | POA: Insufficient documentation

## 2024-04-05 DIAGNOSIS — K760 Fatty (change of) liver, not elsewhere classified: Secondary | ICD-10-CM | POA: Insufficient documentation

## 2024-04-05 DIAGNOSIS — C712 Malignant neoplasm of temporal lobe: Secondary | ICD-10-CM | POA: Diagnosis not present

## 2024-04-05 DIAGNOSIS — Z87442 Personal history of urinary calculi: Secondary | ICD-10-CM | POA: Insufficient documentation

## 2024-04-05 DIAGNOSIS — Z888 Allergy status to other drugs, medicaments and biological substances status: Secondary | ICD-10-CM | POA: Diagnosis not present

## 2024-04-05 DIAGNOSIS — Z9079 Acquired absence of other genital organ(s): Secondary | ICD-10-CM | POA: Insufficient documentation

## 2024-04-05 DIAGNOSIS — Z8419 Family history of other disorders of kidney and ureter: Secondary | ICD-10-CM | POA: Diagnosis not present

## 2024-04-05 DIAGNOSIS — Z8249 Family history of ischemic heart disease and other diseases of the circulatory system: Secondary | ICD-10-CM | POA: Diagnosis not present

## 2024-04-05 DIAGNOSIS — I110 Hypertensive heart disease with heart failure: Secondary | ICD-10-CM | POA: Diagnosis not present

## 2024-04-05 DIAGNOSIS — Z818 Family history of other mental and behavioral disorders: Secondary | ICD-10-CM | POA: Diagnosis not present

## 2024-04-05 DIAGNOSIS — Z9049 Acquired absence of other specified parts of digestive tract: Secondary | ICD-10-CM | POA: Diagnosis not present

## 2024-04-05 DIAGNOSIS — Z79899 Other long term (current) drug therapy: Secondary | ICD-10-CM | POA: Insufficient documentation

## 2024-04-05 DIAGNOSIS — Z885 Allergy status to narcotic agent status: Secondary | ICD-10-CM | POA: Insufficient documentation

## 2024-04-05 DIAGNOSIS — M419 Scoliosis, unspecified: Secondary | ICD-10-CM | POA: Insufficient documentation

## 2024-04-05 MED ORDER — PROCHLORPERAZINE MALEATE 10 MG PO TABS
10.0000 mg | ORAL_TABLET | Freq: Four times a day (QID) | ORAL | 1 refills | Status: AC | PRN
Start: 2024-04-05 — End: ?
  Filled 2024-04-05 (×2): qty 30, 8d supply, fill #0

## 2024-04-05 MED ORDER — TEMOZOLOMIDE 100 MG PO CAPS: 100.0000 mg | ORAL_CAPSULE | Freq: Every day | ORAL | 0 refills | Status: DC

## 2024-04-05 MED ORDER — TEMOZOLOMIDE 250 MG PO CAPS
250.0000 mg | ORAL_CAPSULE | Freq: Every day | ORAL | 0 refills | Status: DC
Start: 2024-04-05 — End: 2024-04-05

## 2024-04-05 MED ORDER — TEMOZOLOMIDE 250 MG PO CAPS
250.0000 mg | ORAL_CAPSULE | Freq: Every day | ORAL | 0 refills | Status: DC
  Filled 2024-04-05: qty 5, 28d supply, fill #0

## 2024-04-05 MED ORDER — TEMOZOLOMIDE 100 MG PO CAPS
100.0000 mg | ORAL_CAPSULE | Freq: Every day | ORAL | 0 refills | Status: DC
  Filled 2024-04-05: qty 5, 28d supply, fill #0

## 2024-04-05 NOTE — Telephone Encounter (Signed)
 Oral Chemotherapy Pharmacist Encounter  I spoke with patient's daughter, Alexandra Walker, for overview of: Temodar  (temozolomide ) for the maintenance treatment of glioblastoma multiforme, planned duration 6-12 months of treatment.  Treatment goal: Palliative  Counseled on administration, dosing, side effects, monitoring, drug-food interactions, safe handling, storage, and disposal.  Patient will take Temodar  250mg  capsules and Temodar  100mg  capsules, 350mg  total daily dose, by mouth once daily, may take at bedtime and on an empty stomach to decrease nausea and vomiting.  If 1st cycle is well tolerated, patient's daughter informed that Temodar  dose may be increased to 200 mg/m2 daily for 5 days on, 23 days off, repeated every 28 days for subsequent cycles   Patient will take Temodar  daily for 5 days on, 23 days off, and repeated.  Temodar  start date: 04/09/24 PM    Adverse effects include but are not limited to: nausea, vomiting, GI upset, rash, and fatigue.  Nausea/Vomiting PPX: Compazine  10 mg by mouth 30-60 min prior to Temodar  dose to help decrease N/V. Alexandra stated they tried to use Zofran  last time, but Zofran  gave patient a headache so they will use Compazine  instead.   Reviewed importance of keeping a medication schedule and plan for any missed doses. No barriers to medication adherence identified.  Medication reconciliation performed and medication/allergy list updated.  Distress thermometer flowsheet: Distress thermometer not completed during telephone call as patient has been on previous lines of therapy.   Communication and Learning Assessment Primary learner: Patient's daughter Barriers to learning: No barriers Preferred language: English Learning preferences: Listening Reading  All questions answered.  Alexandra Walker voiced understanding and appreciation.   Medication education handout placed in mail for patient and patient's daughter. Patient's family knows to call  the office with questions or concerns. Oral Chemotherapy Clinic phone number provided.   [name], PharmD, BCPS, BCOP Hematology/Oncology Clinical Pharmacist [phone] 04/05/2024 3:15 PM

## 2024-04-05 NOTE — Progress Notes (Signed)
 Specialty Pharmacy Initiation Note   Alexandra Walker is a 68 y.o. female who will be followed by the specialty pharmacy service for RxSp Oncology    Review of administration, indication, effectiveness, safety, potential side effects, storage/disposable, and missed dose instructions occurred today for patient's specialty medication(s) No data recorded    Patient/Caregiver did not have any additional questions or concerns.   Patient's therapy is appropriate to: Initiate    Goals Addressed             This Visit's Progress    Slow Disease Progression       Patient is initiating therapy. Patient will maintain adherence         [name], PharmD, BCPS, BCOP Hematology/Oncology Clinical Pharmacist 04/05/2024 2:47 PM

## 2024-04-05 NOTE — Telephone Encounter (Signed)
 Pt called asking to cancel upcoming f/u appt with PA. She advised all questions were answered at today's visit with Dr. Buckley and pt does not have any questions or concerns for Dr. Charisse team. Appt cx.

## 2024-04-05 NOTE — Progress Notes (Signed)
 [facility] at [facility] [address]  [place], [place] [address] [phone]   Interval Evaluation  Date of Service: 04/05/24 Patient Name: Alexandra Walker Patient MRN: [id number] Patient DOB: [birth year] Provider: Arthea MARLA Manns, MD  Identifying Statement:  Alexandra Walker is a 68 y.o. female with right temporal glioblastoma    Oncologic History: Oncology History  Glioblastoma, IDH-wildtype (HCC)  12/29/2023 Surgery   Right temporal craniotomy, resection with Dr. Rosslyn; path is grade 4 glioma   01/26/2024 - 03/05/2024 Radiation Therapy   6 weeks IMRT, concurrent Temozolomide  75mg /m2 with Dr. Izell   04/09/2024 -  Chemotherapy   Initiates adjuvant 5-day Temozolomide  150-200mg /m2      Interval History: Alexandra Walker presents today for follow up, now having completed radiation and Temodar , recent MRI brain.  She continues to do well from functional standpoint without any signficant issues.  Remains independent with gait.  No recurrence of UTI symptoms.  Denies seizures, headaches.  H+P (01/08/24) Patient presented to neurologic attention with several days history of difficulty driving/navigating, some disorganization at work, and headaches.  CNS imaging demonstrated an enhancing mass within the right temporal lobe, c/w primary brain tumor.  She underwent craniotomy, resection with Dr. Rosslyn on 12/29/23; pathology demonstrated grade 4 glioma, IDH pending.  Since surgery, she feels closer to her prior baseline, independent with gait.  Decadron  is at 4mg  twice per day, which has caused some insomnia and elevated blood sugar.  Still on post-op Keppra .    Medications: Current Outpatient Medications on File Prior to Visit  Medication Sig Dispense Refill   aspirin  325 MG tablet Take 325 mg by mouth daily.     butalbital -acetaminophen -caffeine  (FIORICET ) 50-325-40 MG tablet Take 2 tablets by mouth every 4 (four) hours as needed for headache (moderate  headache). 60 tablet 0   cholecalciferol  (VITAMIN D ) 1000 UNITS tablet Take 2,000 Units by mouth daily.      cyanocobalamin  (VITAMIN B12) 1000 MCG tablet Take 1,000 mcg by mouth daily.     fluticasone  (FLONASE ) 50 MCG/ACT nasal spray Place 1 spray into both nostrils in the morning and at bedtime. 16 g 6   furosemide  (LASIX ) 40 MG tablet Take 1 tablet (40 mg total) by mouth daily. 90 tablet 1   losartan  (COZAAR ) 100 MG tablet Take 1 tablet (100 mg total) by mouth daily. 90 tablet 3   Lutein 6 MG CAPS Take 6 mg by mouth daily.     metoprolol  succinate (TOPROL -XL) 100 MG 24 hr tablet Take 1 tablet (100 mg total) by mouth daily with or immediately following a meal. 90 tablet 2   Multiple Vitamin (MULTIVITAMIN WITH MINERALS) TABS tablet Take 1 tablet by mouth daily.     ondansetron  (ZOFRAN ) 8 MG tablet Take 1 tablet (8 mg total) by mouth every 8 (eight) hours as needed for nausea or vomiting. May take 30-60 minutes prior to Temodar  administration if nausea/vomiting occurs as needed. 30 tablet 1   potassium chloride  SA (KLOR-CON  M) 20 MEQ tablet Take 1 tablet (20 mEq total) by mouth daily. 90 tablet 1   prochlorperazine  (COMPAZINE ) 10 MG tablet Take 1 tablet (10 mg total) by mouth every 6 (six) hours as needed for nausea or vomiting. 30 tablet 0   rosuvastatin  (CRESTOR ) 20 MG tablet Take 1 tablet (20 mg total) by mouth daily. 90 tablet 3   traZODone  (DESYREL ) 50 MG tablet Take 0.5-1 tablets (25-50 mg total) by mouth at bedtime as needed for sleep.  30 tablet 3   No current facility-administered medications on file prior to visit.    Allergies:  Allergies  Allergen Reactions   Ace Inhibitors Other (See Comments)    coughing   Dust Mite Extract Other (See Comments)    Sneezing and congestion   Oxycodone  Hcl Other (See Comments) and Nausea And Vomiting    NDC Rniz:[id number]   Oxycodone -Acetaminophen  Nausea And Vomiting   Tree Extract Other (See Comments)    Tree pollen and scrub pollen     Vicodin [Hydrocodone-Acetaminophen ] Nausea And Vomiting   Victoza [Liraglutide] Other (See Comments)    pancreatitis   Adhesive [Tape] Rash    Not on stomach   Past Medical History:  Past Medical History:  Diagnosis Date   ADD (attention deficit disorder)    Allergy    Anticardiolipin syndrome    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   lasted about 6 hours, Echo did not show anything   Bilateral swelling of feet    Cholelithiasis    Chronic depressive disorder    Chronic diastolic CHF (congestive heart failure) (HCC)    Circadian rhythm sleep disorder, shift work    DDD (degenerative disc disease), 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   Diabetes (HCC)    Diverticulosis    Minimal   Fatty liver    GERD (gastroesophageal reflux disease)    Heart murmur    Hepatic steatosis    History of hiatal hernia 2017   Small   HLA B27 (HLA B27 positive)    Hx of pancreatitis    Hyperlipidemia    Hypertension    IBS (irritable bowel syndrome)    Ischemic colitis    Lactose intolerance    Lumbar scoliosis 2010   Mild   Migraine headache without aura 05/10/2015   Multiple thyroid  nodules    Nephrolithiasis 2014   Left, minimal   Obesity due to excess calories with serious comorbidity 05/24/2019   Palpitations    PONV (postoperative nausea and vomiting)    Prediabetes    Presbyopia    Right bundle branch block    Sepsis (HCC) 01/2016   Sleep apnea    Sleep hypopnea    Vitamin D  deficiency    Past Surgical History:  Past Surgical History:  Procedure Laterality Date   APPLICATION OF CRANIAL NAVIGATION N/A 12/29/2023   Procedure: COMPUTER-ASSISTED NAVIGATION, FOR CRANIAL PROCEDURE;  Surgeon: Rosslyn Dino HERO, MD;  Location: [facility];  Service: Neurosurgery;  Laterality: N/A;   BREAST BIOPSY Left    CERVICAL CONE BIOPSY     CHOLECYSTECTOMY N/A 10/26/2014   Procedure: LAPAROSCOPIC CHOLECYSTECTOMY;  Surgeon: Vicenta Poli, MD;  Location: [facility];  Service: General;  Laterality: N/A;    COLONOSCOPY W/ BIOPSIES  multiple   CRANIOTOMY Right 12/29/2023   Procedure: CRANIOTOMY TUMOR EXCISION;  Surgeon: Rosslyn Dino HERO, MD;  Location: [facility] OR;  Service: Neurosurgery;  Laterality: Right;   CYSTOSCOPY WITH RETROGRADE PYELOGRAM, URETEROSCOPY AND STENT PLACEMENT Left 06/26/2018   Procedure: CYSTOSCOPY WITH RETROGRADE PYELOGRAM, URETEROSCOPY AND STENT PLACEMENT;  Surgeon: Alvaro Hummer, MD;  Location: [facility];  Service: Urology;  Laterality: Left;   DIAGNOSTIC LAPAROSCOPY     DILATION AND CURETTAGE OF UTERUS  11/2009   DUPUYTREN CONTRACTURE RELEASE Left    x 3 , Dr Camella   ESOPHAGOGASTRODUODENOSCOPY  multiple   EYE SURGERY     HOLMIUM LASER APPLICATION Left 06/26/2018   Procedure: HOLMIUM LASER APPLICATION;  Surgeon: Alvaro Hummer, MD;  Location: [facility];  Service: Urology;  Laterality: Left;   KNEE ARTHROSCOPY Left 11/2008   LAPAROSCOPIC VAGINAL HYSTERECTOMY WITH SALPINGO OOPHORECTOMY Bilateral 10/03/2015   Procedure: LAPAROSCOPIC ASSISTED VAGINAL HYSTERECTOMY WITH SALPINGECTOMY;  Surgeon: Shanda SHAUNNA Muscat, MD;  Location: [facility];  Service: Gynecology;  Laterality: Bilateral;   LASIK  1990   RK   LEFT FINGER SURGERY     LIVER BIOPSY     MASS EXCISION Left 12/24/2012   Procedure: LEFT EXCISION OF PALMAR MASS X TWO;  Surgeon: Elsie Mussel, MD;  Location: [facility];  Service: Orthopedics;  Laterality: Left;   PHOTOREFRACTIVE KERATOTOMY  1994   RADIAL KERATOTOMY  1994   TRANSOBTURATOR SLING     Social History:  Social History   Socioeconomic History   Marital status: Divorced    Spouse name: Not on file   Number of children: 1   Years of education: Not on file   Highest education level: Master's degree (e.g., MA, MS, MEng, MEd, MSW, MBA)  Occupational History   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   Social Drivers of Health   Financial Resource Strain: Low Risk  (02/25/2024)   Overall Financial Resource Strain (CARDIA)    Difficulty of Paying Living Expenses: Not hard at all  Food Insecurity: No Food Insecurity (02/25/2024)   Hunger Vital Sign    Worried About Running Out of Food in the Last Year: Never true    Ran Out of Food in the Last Year: Never true  Transportation Needs: No Transportation Needs (02/25/2024)   PRAPARE - Administrator, Civil Service (Medical): No    Lack of Transportation (Non-Medical): No  Physical Activity: Insufficiently Active (02/25/2024)   Exercise Vital Sign    Days of Exercise per Week: 2 days    Minutes of Exercise per Session: 20 min  Stress: No Stress Concern Present (02/25/2024)   [employer] of Occupational Health - Occupational Stress Questionnaire    Feeling of Stress: Only a little  Social Connections: Moderately Integrated (02/25/2024)   Social Connection and Isolation Panel    Frequency of Communication with Friends and Family: More than three times a week    Frequency of Social Gatherings with Friends and Family: Once a week    Attends Religious Services: 1 to 4 times per year    Active Member of [employer] or Organizations: Yes    Attends Banker Meetings: 1 to 4 times per year    Marital Status: Divorced  Recent Concern: Social Connections - Moderately Isolated (12/27/2023)   Social Connection and Isolation Panel    Frequency of Communication with Friends and Family: More than three times a week    Frequency of Social Gatherings with Friends and Family: Once a week    Attends  Religious Services: More than 4 times per year    Active Member of [employer] or Organizations: No    Attends Banker Meetings: Never    Marital Status: Divorced  Intimate Partner Violence: Not At Risk (01/09/2024)   Humiliation, Afraid, Rape, and Kick questionnaire    Fear of Current or Ex-Partner: No    Emotionally Abused: No    Physically Abused: No    Sexually Abused: No   Family History:  Family History  Problem Relation Age of Onset   Coronary artery disease Mother    Hypertension Mother    Heart disease Mother    Heart attack Mother    Diabetes Mother    Hyperlipidemia Mother    Kidney disease Mother    Depression Mother    Sleep apnea Mother    Obesity Mother    Heart attack Father    Heart disease Father    Sudden death Father    Hypertension Sister    Hypertension Brother    Colon cancer Neg Hx    Colon polyps Neg Hx    Esophageal cancer Neg Hx    Rectal cancer Neg Hx    Stomach cancer Neg Hx     Review of Systems: Constitutional: Doesn't report fevers, chills or abnormal weight loss Eyes: Doesn't report blurriness of vision Ears, nose, mouth, throat, and face: Doesn't report sore throat Respiratory: Doesn't report cough, dyspnea or wheezes Cardiovascular: Doesn't report palpitation, chest discomfort  Gastrointestinal:  Doesn't report nausea, constipation, diarrhea GU: Doesn't report incontinence Skin: Doesn't report skin rashes Neurological: Per HPI Musculoskeletal: Doesn't report joint pain Behavioral/Psych: Doesn't report anxiety  Physical Exam: Vitals:   04/05/24 1209 04/05/24 1213  BP: (!) 144/76 134/78  Pulse: 72 71  Resp: 16   Temp: (!) 97.5 F (36.4 C)   SpO2: 98%     KPS: 90. General: Alert, cooperative, pleasant, in no acute distress Head: Normal EENT: No conjunctival injection or scleral icterus.  Lungs: Resp effort normal Cardiac: Regular rate Abdomen: Non-distended abdomen Skin: No rashes cyanosis or  petechiae. Extremities: No clubbing or edema  Neurologic Exam: Mental Status: Awake, alert, attentive to examiner. Oriented to self and environment. Language is fluent with intact comprehension.  Cranial Nerves: Visual acuity is grossly normal. Visual fields are full. Extra-ocular movements intact. No ptosis. Face is symmetric Motor: Tone and bulk are normal. Power is full in both arms and legs. Reflexes are symmetric, no pathologic reflexes present.  Sensory: Intact to light touch Gait: Normal.   Labs: I have reviewed the data as listed    Component Value Date/Time   NA 140 03/04/2024 0829   NA 139 11/26/2023 1155   K 4.3 03/04/2024 0829   CL 106 03/04/2024 0829   CO2 31 03/04/2024 0829   GLUCOSE 163 (H) 03/04/2024 0829   BUN 9 03/04/2024 0829   BUN 16 11/26/2023 1155   CREATININE 0.71 03/04/2024 0829   CREATININE 0.76 05/05/2020 1631   CALCIUM  9.4 03/04/2024 0829   PROT 5.9 (L) 03/04/2024 0829   PROT 6.3 09/03/2021 1219   ALBUMIN 3.8 03/04/2024 0829   ALBUMIN 4.1 09/03/2021 1219   AST 16 03/04/2024 0829   ALT 18 03/04/2024 0829   ALKPHOS 85 03/04/2024 0829   BILITOT 0.5 03/04/2024 0829   GFRNONAA >60 03/04/2024 0829   GFRAA >60 04/20/2018 1323   Lab Results  Component Value Date   WBC 3.5 (L) 03/04/2024   NEUTROABS 2.6 03/04/2024   HGB 13.7 03/04/2024   HCT 40.6 03/04/2024   MCV 86.4 03/04/2024   PLT 174 03/04/2024    Imaging:  [facility] Clinician Interpretation: I have personally reviewed the CNS images as listed.  My interpretation,  in the context of the patient's clinical presentation, is treatment effect vs true progression  MR BRAIN W WO CONTRAST Result Date: 04/01/2024 EXAM: MRI BRAIN WITH AND WITHOUT CONTRAST 04/01/2024 11:55:21 AM TECHNIQUE: Multiplanar multisequence MRI of the head/brain was performed with and without the administration of 10 mL gadobutrol  (GADAVIST ) 1 MMOL/ML injection. COMPARISON: MRI of the head dated 10/30/2023. CLINICAL HISTORY:  Brain/CNS neoplasm, assess treatment response. FINDINGS: BRAIN AND VENTRICLES: No acute infarct. No acute intracranial hemorrhage. No significant midline shift. No hydrocephalus. The sella is unremarkable. Normal flow voids. Status post right temporal craniotomy for resection of a lesion within the right posterior temporal lobe. There is mild residual enhancement along the margins of the resection cavity which has improved relative to the prior study. However, there are 2 new adjacent nodular areas of contrast enhancement present more anteriorly within the right temporal lobe which are evident on image 54 of series 16 and image 6 of series 18. The nodules measure a combined 5 mm in transverse diameter. The extensive cerebral edema present within the right cerebral hemisphere on the previous study has resolved. There is residual increased T2 signal present within the cortex of the right anterolateral temporal lobe, similar to the prior exam. There is mild-to-moderate periventricular and subcortical white matter disease. ORBITS: No acute abnormality. SINUSES: No acute abnormality. BONES AND SOFT TISSUES: Normal bone marrow signal and enhancement. No acute soft tissue abnormality. There is a new right mastoid air cell effusion. IMPRESSION: 1. Two new adjacent nodular areas of contrast enhancement within the right temporal lobe anterior to the surgical resection cavity, measuring a combined 5 mm in transverse diameter, worrisome for residual tumor. 2. Mild residual enhancement along the margins of the resection cavity, improved relative to the prior study. 3. Extensive cerebral edema present within the right cerebral hemisphere on the previous study has nearly resolved. 4. Residual increased T2 signal within the cortex of the right anterolateral temporal lobe, similar to the prior exam. 5. New right mastoid air cell effusion. Electronically signed by: Evalene Coho MD 04/01/2024 12:40 PM EST RP Workstation:  [id number]    Assessment/Plan Glioblastoma, IDH-wildtype (HCC)  [name] Pouch is clinically stable today, now having completed 6 weeks of IMRT and concurrent Temodar .  MRI brain demonstrates small focus of enhancement R temporal within high dose RT field; favor treatment effect at this time.  We recommended initiating treatment with cycle #1 Temozolomide  150mg /m2, on for five days and off for twenty three days in twenty eight day cycles. The patient will have a complete blood count performed on days 21 and 28 of each cycle, and a comprehensive metabolic panel performed on day 28 of each cycle. Labs may need to be performed more often. Zofran  will prescribed for home use for nausea/vomiting.   Informed consent was obtained verbally at bedside to proceed with oral chemotherapy.  Chemotherapy should be held for the following:  ANC less than 1,000  Platelets less than 100,000  LFT or creatinine greater than 2x ULN  If clinical concerns/contraindications develop  Should remain off decadron  if tolerated.  We ask that ROSEBUD KOENEN return to clinic in 1 months with labs prior to cycle #2, or sooner as needed.  All questions were answered. The patient knows to call the clinic with any problems, questions or concerns. No barriers to learning were detected.  The total time spent in the encounter was 40 minutes and more than 50% was on counseling and review of test results   Arthea  MARLA Manns, MD Medical Director of Neuro-Oncology [facility] at [facility] 04/05/24 2:02 PM

## 2024-04-05 NOTE — Telephone Encounter (Signed)
 Oral Oncology Pharmacist Encounter  Received new prescription for Temodar  (temozolomide ) for the maintenance treatment of glioblastoma, planned duration at least 6 months or until disease progression or unacceptable drug toxicity.  CBC w/ Diff and CMP from 03/04/24 assessed, no relevant lab abnormalities requiring baseline dose adjustment required at this time. Prescription dose and frequency assessed for appropriateness.  Current medication list in Epic reviewed, no relevant/significant DDIs with Temodar  identified.  Evaluated chart and no patient barriers to medication adherence noted.   Patient agreement for treatment documented in MD note on 04/05/24.  Prescription has been e-scribed to the [facility] for benefits analysis and approval.  Oral Oncology Clinic will continue to follow for insurance authorization, copayment issues, initial counseling and start date.  [name], PharmD, BCPS, BCOP Hematology/Oncology Clinical Pharmacist [phone] 04/05/2024 2:18 PM

## 2024-04-05 NOTE — Progress Notes (Signed)
 Specialty Pharmacy Initial Fill Coordination Note  Alexandra Walker is a 68 y.o. female contacted today regarding refills of specialty medication(s) Temozolomide  (TEMODAR ) .  Patient requested Delivery  on 04/07/24  to verified address [address]   [place] [address]   Medication will be filled on 04/06/24.   Patient is aware of $0.00 copayment.    Add compazine  to delivery

## 2024-04-06 ENCOUNTER — Telehealth: Payer: Self-pay | Admitting: Internal Medicine

## 2024-04-06 ENCOUNTER — Ambulatory Visit: Admitting: Radiology

## 2024-04-06 NOTE — Telephone Encounter (Signed)
 Scheduled patient for next appointment. Called and spoke with the patients daughter she is aware.

## 2024-04-08 ENCOUNTER — Encounter: Payer: Self-pay | Admitting: Internal Medicine

## 2024-04-09 ENCOUNTER — Other Ambulatory Visit (HOSPITAL_BASED_OUTPATIENT_CLINIC_OR_DEPARTMENT_OTHER): Payer: Self-pay

## 2024-04-09 ENCOUNTER — Other Ambulatory Visit: Payer: Self-pay | Admitting: *Deleted

## 2024-04-09 ENCOUNTER — Encounter: Payer: Self-pay | Admitting: Internal Medicine

## 2024-04-09 MED ORDER — CIPROFLOXACIN HCL 500 MG PO TABS
500.0000 mg | ORAL_TABLET | Freq: Two times a day (BID) | ORAL | 0 refills | Status: DC
  Filled 2024-04-09: qty 14, 7d supply, fill #0

## 2024-04-20 ENCOUNTER — Ambulatory Visit (INDEPENDENT_AMBULATORY_CARE_PROVIDER_SITE_OTHER): Admitting: Otolaryngology

## 2024-04-20 ENCOUNTER — Encounter (INDEPENDENT_AMBULATORY_CARE_PROVIDER_SITE_OTHER): Payer: Self-pay | Admitting: Otolaryngology

## 2024-04-20 ENCOUNTER — Telehealth (INDEPENDENT_AMBULATORY_CARE_PROVIDER_SITE_OTHER): Payer: Self-pay

## 2024-04-20 ENCOUNTER — Other Ambulatory Visit (HOSPITAL_BASED_OUTPATIENT_CLINIC_OR_DEPARTMENT_OTHER): Payer: Self-pay

## 2024-04-20 ENCOUNTER — Ambulatory Visit (INDEPENDENT_AMBULATORY_CARE_PROVIDER_SITE_OTHER): Admitting: Audiology

## 2024-04-20 VITALS — BP 173/93 | HR 77 | Ht 66.0 in | Wt 252.0 lb

## 2024-04-20 DIAGNOSIS — H90A11 Conductive hearing loss, unilateral, right ear with restricted hearing on the contralateral side: Secondary | ICD-10-CM

## 2024-04-20 DIAGNOSIS — H6991 Unspecified Eustachian tube disorder, right ear: Secondary | ICD-10-CM

## 2024-04-20 DIAGNOSIS — H90A31 Mixed conductive and sensorineural hearing loss, unilateral, right ear with restricted hearing on the contralateral side: Secondary | ICD-10-CM

## 2024-04-20 DIAGNOSIS — H90A22 Sensorineural hearing loss, unilateral, left ear, with restricted hearing on the contralateral side: Secondary | ICD-10-CM | POA: Diagnosis not present

## 2024-04-20 DIAGNOSIS — R0981 Nasal congestion: Secondary | ICD-10-CM | POA: Diagnosis not present

## 2024-04-20 DIAGNOSIS — H6521 Chronic serous otitis media, right ear: Secondary | ICD-10-CM

## 2024-04-20 MED ORDER — AMOXICILLIN-POT CLAVULANATE 875-125 MG PO TABS
1.0000 | ORAL_TABLET | Freq: Two times a day (BID) | ORAL | 0 refills | Status: AC
  Filled 2024-04-20: qty 14, 7d supply, fill #0

## 2024-04-20 MED ORDER — CIPROFLOXACIN-DEXAMETHASONE 0.3-0.1 % OT SUSP
4.0000 [drp] | Freq: Two times a day (BID) | OTIC | 1 refills | Status: AC
  Filled 2024-04-20: qty 7.5, 7d supply, fill #0

## 2024-04-20 NOTE — Progress Notes (Signed)
 Dear Dr. Jodie, Here is my assessment for our mutual patient, Alexandra Walker. Thank you for allowing me the opportunity to care for your patient. Please do not hesitate to contact me should you have any other questions. Sincerely, Dr. Eldora Blanch  Otolaryngology Clinic Note Referring provider: Dr. Jodie HPI:  Initial visit (02/2024): Discussed the use of AI scribe software for clinical note transcription with the patient, who gave verbal consent to proceed.  History of Present Illness Alexandra Walker is a 68 year old female with brain cancer who presents with muffling and fullness in her right ear. She was referred by Dr. Izell for evaluation of ear symptoms following radiation treatment.  Muffling and fullness in the right ear began during the first week of radiation treatment, with a sensation of fluid and a 'swooshing' sound when tilting her head. Symptoms have worsened, causing mild pain and muffling on the right side. There is no ear drainage, tinnitus, or vertigo.  Three to four years ago, she experienced an eardrum rupture due to fluid buildup, treated with antibiotics and ear drops. She is unsure if it was the same ear. She has no history of ear infections or surgeries but has had occasional wax buildup causing muffled hearing, resolved with cleaning.  Patient additionally denies history of: deep pain in ear canal, eustachian tube symptoms such as popping, crackling, sensitive to pressure changes Patient also denies barotrauma, vestibular suppressant use, ototoxic medication use  Current medications include Sudafed and Afrin nasal spray, used to aid sleep due to nasal CPAP use, and intermittent saline sprays. She has a chronic nasal congestion but no recent sinus infections requiring antibiotics or steroids.  --------------------------------------------------------- 04/20/2024 She has been using sudafed and flonase . She feels like maybe some better on right; still having fullness, no  pain/ Prednisone  helped not much. We discussed options. Denies significant nasal symptoms except for left nasal congestion -- using afrin.  Personal or FHx of bleeding dz or anesthesia difficulty: no   Tobacco: no  PMHx: Glioma of right temporal lobe, HTN, DM, NAFLD, OSA  Independent Review of Additional Tests or Records:  Dr. 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 Noted persistent headaches and dizziness, MRI brain done. Noted fullness in both ears, due to get ears cleaned - noted some disequilibrium. Dx: Ear fullness and in Oct ref to ENT Dr. Ethyl 02/12/2021: cerumen impaction; noted h/o left ear shingles; bruxism; Dx: cerumen impaction; Rx: cleaned CBC and CMP 03/04/2024: WBC 3.5, Eos 200; BUN/Cr 9/0.71 CTH 01/24/2024 independently interpreted with respect to sinuses and ears: noted mastoids and ME well aerated but cuts thick so suboptimal study; some b/l ethmoid and OMC opacification but otherwise paranasal sinuses generally clear 04/2024 Audiogram was independently reviewed and interpreted by me and it reveals - AD/AS B/A tymps; AS - normal to borderline SNHL; AD - conductive hearing loss with ABG ~20-30dB across frequencies WRT 96%/100% AD/AS at 80/60dB   SNHL= Sensorineural hearing loss  MRI Brain 04/01/2024 independently interpreted with respect to ears: right mastoid effusion without any obvious large encephalocele; no obvious nasopharyngeal lesions.  PMH/Meds/All/SocHx/FamHx/ROS:   Past Medical History:  Diagnosis Date   ADD (attention deficit disorder)    Allergy    Anticardiolipin syndrome    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   lasted about 6 hours, Echo did not show anything   Bilateral swelling of feet    Cholelithiasis    Chronic depressive disorder    Chronic diastolic CHF (congestive heart failure) (HCC)  Circadian rhythm sleep disorder, shift work    DDD (degenerative disc disease), 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   Diabetes (HCC)     Diverticulosis    Minimal   Fatty liver    GERD (gastroesophageal reflux disease)    Heart murmur    Hepatic steatosis    History of hiatal hernia 2017   Small   HLA B27 (HLA B27 positive)    Hx of pancreatitis    Hyperlipidemia    Hypertension    IBS (irritable bowel syndrome)    Ischemic colitis    Lactose intolerance    Lumbar scoliosis 2010   Mild   Migraine headache without aura 05/10/2015   Multiple thyroid  nodules    Nephrolithiasis 2014   Left, minimal   Obesity due to excess calories with serious comorbidity 05/24/2019   Palpitations    PONV (postoperative nausea and vomiting)    Prediabetes    Presbyopia    Right bundle branch block    Sepsis (HCC) 01/2016   Sleep apnea    Sleep hypopnea    Vitamin D  deficiency      Past Surgical History:  Procedure Laterality Date   APPLICATION OF CRANIAL NAVIGATION N/A 12/29/2023   Procedure: COMPUTER-ASSISTED NAVIGATION, FOR CRANIAL PROCEDURE;  Surgeon: Rosslyn Dino HERO, MD;  Location: [facility] OR;  Service: Neurosurgery;  Laterality: N/A;   BREAST BIOPSY Left    CERVICAL CONE BIOPSY     CHOLECYSTECTOMY N/A 10/26/2014   Procedure: LAPAROSCOPIC CHOLECYSTECTOMY;  Surgeon: Vicenta Poli, MD;  Location: [facility];  Service: General;  Laterality: N/A;   COLONOSCOPY W/ BIOPSIES  multiple   CRANIOTOMY Right 12/29/2023   Procedure: CRANIOTOMY TUMOR EXCISION;  Surgeon: Rosslyn Dino HERO, MD;  Location: [facility] OR;  Service: Neurosurgery;  Laterality: Right;   CYSTOSCOPY WITH RETROGRADE PYELOGRAM, URETEROSCOPY AND STENT PLACEMENT Left 06/26/2018   Procedure: CYSTOSCOPY WITH RETROGRADE PYELOGRAM, URETEROSCOPY AND STENT PLACEMENT;  Surgeon: Alvaro Hummer, MD;  Location: [facility];  Service: Urology;  Laterality: Left;   DIAGNOSTIC LAPAROSCOPY     DILATION AND CURETTAGE OF UTERUS  11/2009   DUPUYTREN CONTRACTURE RELEASE Left    x 3 , Dr Camella   ESOPHAGOGASTRODUODENOSCOPY  multiple   EYE SURGERY     HOLMIUM  LASER APPLICATION Left 06/26/2018   Procedure: HOLMIUM LASER APPLICATION;  Surgeon: Alvaro Hummer, MD;  Location: [facility];  Service: Urology;  Laterality: Left;   KNEE ARTHROSCOPY Left 11/2008   LAPAROSCOPIC VAGINAL HYSTERECTOMY WITH SALPINGO OOPHORECTOMY Bilateral 10/03/2015   Procedure: LAPAROSCOPIC ASSISTED VAGINAL HYSTERECTOMY WITH SALPINGECTOMY;  Surgeon: Shanda SHAUNNA Muscat, MD;  Location: [facility];  Service: Gynecology;  Laterality: Bilateral;   LASIK  1990   RK   LEFT FINGER SURGERY     LIVER BIOPSY     MASS EXCISION Left 12/24/2012   Procedure: LEFT EXCISION OF PALMAR MASS X TWO;  Surgeon: Elsie Camella, MD;  Location: [facility];  Service: Orthopedics;  Laterality: Left;   PHOTOREFRACTIVE KERATOTOMY  1994   RADIAL KERATOTOMY  1994   TRANSOBTURATOR SLING      Family History  Problem Relation Age of Onset   Coronary artery disease Mother    Hypertension Mother    Heart disease Mother    Heart attack Mother    Diabetes Mother    Hyperlipidemia Mother    Kidney disease Mother    Depression Mother    Sleep apnea Mother    Obesity Mother    Heart attack Father  Heart disease Father    Sudden death Father    Hypertension Sister    Hypertension Brother    Colon cancer Neg Hx    Colon polyps Neg Hx    Esophageal cancer Neg Hx    Rectal cancer Neg Hx    Stomach cancer Neg Hx      Social Connections: Moderately Integrated (02/25/2024)   Social Connection and Isolation Panel    Frequency of Communication with Friends and Family: More than three times a week    Frequency of Social Gatherings with Friends and Family: Once a week    Attends Religious Services: 1 to 4 times per year    Active Member of [employer] or Organizations: Yes    Attends Banker Meetings: 1 to 4 times per year    Marital Status: Divorced  Recent Concern: Social Connections - Moderately Isolated (12/27/2023)   Social Connection and Isolation Panel    Frequency  of Communication with Friends and Family: More than three times a week    Frequency of Social Gatherings with Friends and Family: Once a week    Attends Religious Services: More than 4 times per year    Active Member of [employer] or Organizations: No    Attends Banker Meetings: Never    Marital Status: Divorced      Current Outpatient Medications:    amoxicillin -clavulanate (AUGMENTIN ) 875-125 MG tablet, Take 1 tablet by mouth 2 (two) times daily for 7 days., Disp: 14 tablet, Rfl: 0   ciprofloxacin -dexamethasone  (CIPRODEX ) OTIC suspension, Place 4 drops into the left ear 2 (two) times daily for 7 days., Disp: 7.5 mL, Rfl: 1   aspirin  325 MG tablet, Take 325 mg by mouth daily., Disp: , Rfl:    butalbital -acetaminophen -caffeine  (FIORICET ) 50-325-40 MG tablet, Take 2 tablets by mouth every 4 (four) hours as needed for headache (moderate headache)., Disp: 60 tablet, Rfl: 0   cholecalciferol  (VITAMIN D ) 1000 UNITS tablet, Take 2,000 Units by mouth daily. , Disp: , Rfl:    ciprofloxacin  (CIPRO ) 500 MG tablet, Take 1 tablet (500 mg total) by mouth 2 (two) times daily., Disp: 14 tablet, Rfl: 0   cyanocobalamin  (VITAMIN B12) 1000 MCG tablet, Take 1,000 mcg by mouth daily., Disp: , Rfl:    fluticasone  (FLONASE ) 50 MCG/ACT nasal spray, Place 1 spray into both nostrils in the morning and at bedtime., Disp: 16 g, Rfl: 6   furosemide  (LASIX ) 40 MG tablet, Take 1 tablet (40 mg total) by mouth daily., Disp: 90 tablet, Rfl: 1   losartan  (COZAAR ) 100 MG tablet, Take 1 tablet (100 mg total) by mouth daily., Disp: 90 tablet, Rfl: 3   Lutein 6 MG CAPS, Take 6 mg by mouth daily., Disp: , Rfl:    metoprolol  succinate (TOPROL -XL) 100 MG 24 hr tablet, Take 1 tablet (100 mg total) by mouth daily with or immediately following a meal., Disp: 90 tablet, Rfl: 2   Multiple Vitamin (MULTIVITAMIN WITH MINERALS) TABS tablet, Take 1 tablet by mouth daily., Disp: , Rfl:    ondansetron  (ZOFRAN ) 8 MG tablet, Take 1  tablet (8 mg total) by mouth every 8 (eight) hours as needed for nausea or vomiting. May take 30-60 minutes prior to Temodar  administration if nausea/vomiting occurs as needed., Disp: 30 tablet, Rfl: 1   potassium chloride  SA (KLOR-CON  M) 20 MEQ tablet, Take 1 tablet (20 mEq total) by mouth daily., Disp: 90 tablet, Rfl: 1   prochlorperazine  (COMPAZINE ) 10 MG tablet, Take 1 tablet (10  mg total) by mouth every 6 (six) hours as needed for nausea or vomiting., Disp: 30 tablet, Rfl: 1   rosuvastatin  (CRESTOR ) 20 MG tablet, Take 1 tablet (20 mg total) by mouth daily., Disp: 90 tablet, Rfl: 3   temozolomide  (TEMODAR ) 100 MG capsule, Take 1 capsule (100 mg total) by mouth daily. (Take with ONE 250 mg capsule for total daily dose of 350 mg). Take for 5 days on, 23 days off. Repeat every 28 days. May take on an empty stomach to decrease nausea & vomiting., Disp: 5 capsule, Rfl: 0   temozolomide  (TEMODAR ) 250 MG capsule, Take 1 capsule (250 mg total) by mouth daily. (Take with ONE 100 mg capsule for total daily dose of 350 mg). Take for 5 days on, 23 days off. Repeat every 28 days. May take on an empty stomach to decrease nausea & vomiting., Disp: 5 capsule, Rfl: 0   traZODone  (DESYREL ) 50 MG tablet, Take 0.5-1 tablets (25-50 mg total) by mouth at bedtime as needed for sleep., Disp: 30 tablet, Rfl: 3   Physical Exam:   BP (!) 173/93 (BP Location: Right Arm, Patient Position: Sitting, Cuff Size: Large)   Pulse 77   Ht 5' 6 (1.676 m)   Wt 252 lb (114.3 kg)   LMP 05/20/2008   SpO2 93%   BMI 40.67 kg/m   Salient findings:  CN II-XII intact Given history and complaints, ear microscopy was indicated and performed for evaluation with findings as below in physical exam section and in procedures; right ear canal looks quite irritated, but clear right serous effusion; see procedure below; left EAC clear and TM intact  Weber 512: AD Rinne 512: AC > BC b/l  Anterior rhinoscopy: Septum intact; no purulence No  lesions of oral cavity/oropharynx; No obviously palpable neck masses/lymphadenopathy/thyromegaly No respiratory distress or stridor. Nasal endoscopy was indicated to better evaluate the nose and paranasal sinuses, given the patient's history and exam findings, and is detailed below.   Seprately Identifiable Procedures:  Prior to initiating any procedures, risks/benefits/alternatives were explained to the patient and verbal consent obtained. PROCEDURE: Bilateral Diagnostic Rigid Nasal Endoscopy Pre-procedure diagnosis: Concern for nasal mass given right ear effusion Post-procedure diagnosis: same Indication: See pre-procedure diagnosis and physical exam above Complications: None apparent EBL: 0 mL Anesthesia: Lidocaine  4% and topical decongestant was topically sprayed in each nasal cavity  Description of Procedure:  Patient was identified. A rigid 30 degree endoscope was utilized to evaluate the sinonasal cavities, mucosa, sinus ostia and turbinates and septum.  Overall, signs of mucosal inflammation are not noted.  No mucopurulence, polyps, or masses noted.   Right Middle meatus: clear Right SE Recess: clear Left MM: clear Left SE Recess: clear Evidence of erythema suggestive of rhinitis medicamentosa Photodocumentation was obtained.  CPT CODE -- [id number] - Mod 25  Otolaryngology Procedure Visit:   Procedure: Bilateral ear microscopy with right myringotomy and tympanostomy tube placement (CPT [phone]) - RT Pre-procedure diagnosis:  Right chronic serous otitis media Right mixed hearing loss Right eustachian tube dysfunction Post-procedure diagnosis: same Indication: Patient is a 68 y.o. female with pre-procedure diagnoses above. We discussed options: 1. Observation 2. Nasal sprays 3. Tympanostomy tube. Risks discussed, patient opted for tympanostomy tube placement. She has tried medical management but did not get significantly better.  We had a long discussion about benefits and risks of  Tympanostomy tube placement including pain, bleeding, infection, early or late extrusion, TM perforation, otorrhea, injury to middle or external ear structures, nature of tympanostomy tubes,  cholesteatoma, hearing loss, among others. Consent was obtained prior to proceeding.  Findings: Right Ear: serous effusion Left ear: clear Successful tympanostomy tube placement on right  Complications: None apparent  Procedure details: Patient was placed semi recumbent on the exam chair. The right ear was addressed first with binocular microscopy and any cerumen was cleaned from the ear canal. The tympanic membrane was visualized, with findings as above. A focal spot over the inferior TM was anesthetized with topical phenol. A myringotomy incision wastthen made radially. Serous effusion was encountered. The middle ear cleft was suctioned. The PE tube was placed and ciprodex  drops were applied. A cotton ball was placed at the meatus. Left ear with findings above  Patient tolerated the procedure well     Impression & Plans:  Alexandra Walker is a 68 y.o. female with:  1. Dysfunction of right eustachian tube   2. Chronic serous otitis media of right ear   3. Mixed conductive and sensorineural hearing loss of right ear with restricted hearing of left ear    Right serous otitis media post-radiation,  with persistent symptoms of muffling, fullness, and decreased hearing. Discussed options including steroids, INCS, and possible right myringotomy with tube placement. May be at long term risk for ETD She has not improved after medical mgmt so opted for right tymp tube - Ciprodex  BID x7d right ear with augmentin  BID - f/u in 3 months with audio  See below regarding exact medications prescribed this encounter including dosages and route: Meds ordered this encounter  Medications   amoxicillin -clavulanate (AUGMENTIN ) 875-125 MG tablet    Sig: Take 1 tablet by mouth 2 (two) times daily for 7 days.     Dispense:  14 tablet    Refill:  0   ciprofloxacin -dexamethasone  (CIPRODEX ) OTIC suspension    Sig: Place 4 drops into the left ear 2 (two) times daily for 7 days.    Dispense:  7.5 mL    Refill:  1      Thank you for allowing me the opportunity to care for your patient. Please do not hesitate to contact me should you have any other questions.  Sincerely, Eldora Blanch, MD Otolaryngologist (ENT), [facility] Phone: [phone] Fax: [phone]  04/20/2024, 11:14 AM   MDM:  I have personally spent 40 minutes involved in face-to-face and non-face-to-face activities for this patient on the day of the visit.  Professional time spent excludes any procedures performed but includes the following activities, in addition to those noted in the documentation: preparing to see the patient (review of outside documentation and results), performing a medically appropriate examination, counseling, ordering medications (ciprodex , augmentin ), documenting in the electronic health record, independently interpreting results (MRI).

## 2024-04-20 NOTE — Patient Instructions (Signed)
 Use Ciprodex  4 drops twice daily x7d in right ear Take Augmentin  875 mg by mouth (PO) twice daily for 7 days; take with food, take probiotic or yogurt with it

## 2024-04-20 NOTE — Progress Notes (Signed)
  [address] [place], [place] [address] [phone]  Audiological Evaluation    Name: Alexandra Walker     DOB:   1955-12-10      MRN:   [id number]                                                                                     Service Date: 04/20/2024     Accompanied by: daughter   Patient comes today after Dr. Tobie, ENT sent a referral for a hearing evaluation due to concerns with hearing loss.   Symptoms Yes Details  Hearing loss  []  Right ear muffled sensation since the radiation.  Tinnitus  []    Ear pain/ infections/pressure  []    Balance problems  []    Noise exposure history  []    Previous ear surgeries  []    Family history of hearing loss  []    Amplification  []    Other  [x]  Right temporal craniotomy for resection of a lesion within the right posterior temporal lobe    Otoscopy: Right ear: Abnormal eardrum appearance. Left ear:  Clear external ear canal and notable landmarks visualized on the tympanic membrane.  Tympanometry: Right ear: Normal external ear canal volume with no middle ear pressure peak or tympanic membrane compliance (Type B).   Findings are suggestive of abnormal middle ear function, such as middle ear effusion. Results are consistent with the presence of middle ear effusion. Left ear: Normal external ear canal volume with normal middle ear pressure and tympanic membrane compliance (Type A). Findings are suggestive of normal middle ear function.    Hearing Evaluation The hearing test results were completed under headphones and results are deemed to be of good reliability. Test technique:  conventional    Pure tone Audiometry: Right ear- Mild to moderate conductive hearing loss from 250 Hz - 8000 Hz. Left ear-  Borderline normal hearing [phone] Hz and 8000 Hz and then mild  sensorineural hearing loss from 4000 Hz.  Speech Audiometry: Right ear- Speech Reception Threshold (SRT) was obtained at 40 dBHL. Left ear-Speech  Reception Threshold (SRT) was obtained at 20 dBHL.   Word Recognition Score Tested using NU-6 (recorded) Right ear: 96% was obtained at a presentation level of 80 dBHL with contralateral masking which is deemed as  excellent. Left ear: 100% was obtained at a presentation level of 60 dBHL with contralateral masking which is deemed as  excellent.   Recommendations: Follow up with ENT as scheduled for today., Repeat audiogram after medical care.   Yumna Ebers Tyreisha LEROUX-MARTINEZ, AUD

## 2024-04-20 NOTE — Telephone Encounter (Signed)
 Patient called stating she had some questions about the tubes in her ears. Requested a call back.

## 2024-04-20 NOTE — Addendum Note (Signed)
 Addended by: Tamiko Leopard on: 04/20/2024 11:16 AM   Modules accepted: Orders

## 2024-04-21 NOTE — Telephone Encounter (Signed)
 LVM informing patient that we were returning her call to get more details about her questions regarding tubes.

## 2024-04-22 ENCOUNTER — Other Ambulatory Visit: Payer: Self-pay

## 2024-04-22 NOTE — Telephone Encounter (Signed)
 Poke to patient's daughter and she just wanted to clarify that the tubes in her ears were not made of metal due to her having frequent MRI done. Informed her that they were not. Patient's daughter verbalized understanding.

## 2024-04-23 ENCOUNTER — Ambulatory Visit (INDEPENDENT_AMBULATORY_CARE_PROVIDER_SITE_OTHER): Admitting: Family Medicine

## 2024-04-23 ENCOUNTER — Encounter: Payer: Self-pay | Admitting: Family Medicine

## 2024-04-23 VITALS — BP 136/88 | HR 73 | Temp 97.9°F | Ht 66.0 in | Wt 253.0 lb

## 2024-04-23 DIAGNOSIS — R609 Edema, unspecified: Secondary | ICD-10-CM

## 2024-04-23 DIAGNOSIS — Z23 Encounter for immunization: Secondary | ICD-10-CM

## 2024-04-23 DIAGNOSIS — K760 Fatty (change of) liver, not elsewhere classified: Secondary | ICD-10-CM | POA: Diagnosis not present

## 2024-04-23 DIAGNOSIS — E1159 Type 2 diabetes mellitus with other circulatory complications: Secondary | ICD-10-CM

## 2024-04-23 DIAGNOSIS — E119 Type 2 diabetes mellitus without complications: Secondary | ICD-10-CM | POA: Diagnosis not present

## 2024-04-23 DIAGNOSIS — E1169 Type 2 diabetes mellitus with other specified complication: Secondary | ICD-10-CM

## 2024-04-23 DIAGNOSIS — C719 Malignant neoplasm of brain, unspecified: Secondary | ICD-10-CM | POA: Diagnosis not present

## 2024-04-23 DIAGNOSIS — G4486 Cervicogenic headache: Secondary | ICD-10-CM

## 2024-04-23 DIAGNOSIS — C712 Malignant neoplasm of temporal lobe: Secondary | ICD-10-CM

## 2024-04-23 DIAGNOSIS — Z0001 Encounter for general adult medical examination with abnormal findings: Secondary | ICD-10-CM | POA: Diagnosis not present

## 2024-04-23 DIAGNOSIS — E041 Nontoxic single thyroid nodule: Secondary | ICD-10-CM

## 2024-04-23 DIAGNOSIS — E782 Mixed hyperlipidemia: Secondary | ICD-10-CM | POA: Diagnosis not present

## 2024-04-23 LAB — LIPID PANEL
Cholesterol: 159 mg/dL (ref 0–200)
HDL: 36.8 mg/dL — ABNORMAL LOW (ref >39.00)
LDL Cholesterol: 82 mg/dL (ref 0–99)
NonHDL: 122.25
Total CHOL/HDL Ratio: 4
Triglycerides: 202 mg/dL — ABNORMAL HIGH (ref 0.0–149.0)
VLDL: 40.4 mg/dL — ABNORMAL HIGH (ref 0.0–40.0)

## 2024-04-23 LAB — TSH: TSH: 1.04 u[IU]/mL (ref 0.35–5.50)

## 2024-04-23 LAB — HEMOGLOBIN A1C: Hgb A1c MFr Bld: 6.3 % (ref 4.6–6.5)

## 2024-04-23 NOTE — Progress Notes (Signed)
 Subjective  Chief Complaint  Patient presents with   Annual Exam    Pt here for Annual Exam and is not currently fasting    Diabetes    HPI: Alexandra Walker is a 68 y.o. female who presents to [facility] at [place] today for a Female Wellness Visit. She also has the concerns and/or needs as listed above in the chief complaint. These will be addressed in addition to the Health Maintenance Visit.   Wellness Visit: annual visit with health maintenance review and exam  HM: do not recommend mammogram; undergoing treatment for brain cancer. Reports, overall tolerating treatments well and trying to keep positive.   Chronic disease f/u and/or acute problem visit: (deemed necessary to be done in addition to the wellness visit): Discussed the use of AI scribe software for clinical note transcription with the patient, who gave verbal consent to proceed.  History of Present Illness Alexandra Walker is a 68 year old female with diabetes and a history of head and neck cancer who presents to discuss diabetes management and ear issues post-radiation.  Hyperglycemia and diabetes management, currently diet controlled.  - Fasting blood glucose in the 120s mg/dL - Two-hour postprandial blood glucose ranges from 125 to 135 mg/dL - Considering resuming metformin  for glycemic control - No history of hypoglycemia on metformin , even at higher doses  Otologic symptoms post-radiation - Recent tympanostomy tube placement in right ear for persistent middle ear effusion - History of head and neck cancer with prior radiation therapy resulting in right ear scarring - Current treatment with Augmentin  and dexamethasone /ciprofloxacin  ear drops - Persistent sensation of fluid in right ear - Improvement in hearing and pressure symptoms since intervention - Recent audiogram demonstrated middle ear fluid  Oncologic treatment and associated symptoms - Currently receiving Temodar , five days every 28  days, at a higher dose than initial treatment phase - Fatigue present but treatment is otherwise well tolerated  Respiratory symptoms - Recent episode of bronchitis, likely contracted from a family member - No fever or significant respiratory symptoms - Cough present, managed with Mucinex  Headache and neck tension - Headaches primarily attributed to neck tension - Headaches resolve with neck movement  Peripheral edema - Occasional use of Lasix  for swelling, dependent on fluid intake  Gastrointestinal symptoms - Diarrhea present for the past week, likely secondary to Augmentin     Assessment  1. Encounter for well adult exam with abnormal findings   2. Need for influenza vaccination   3. Controlled type 2 diabetes mellitus without complication, without long-term current use of insulin  (HCC)   4. Combined hyperlipidemia associated with type 2 diabetes mellitus (HCC)   5. Hypertension associated with diabetes (HCC)   6. Glioblastoma, IDH-wildtype (HCC)   7. Cancer of temporal lobe (HCC)   8. Thyroid  nodule      Plan  Female Wellness Visit: Age appropriate Health Maintenance and Prevention measures were discussed with patient. Included topics are cancer screening recommendations, ways to keep healthy (see AVS) including dietary and exercise recommendations, regular eye and dental care, use of seat belts, and avoidance of moderate alcohol use and tobacco use.  BMI: discussed patient's BMI and encouraged positive lifestyle modifications to help get to or maintain a target BMI. HM needs and immunizations were addressed and ordered. See below for orders. See HM and immunization section for updates. Routine labs and screening tests ordered including cmp, cbc and lipids where appropriate. Discussed recommendations regarding Vit D and calcium  supplementation (see  AVS)  Chronic disease management visit and/or acute problem visit: Assessment and Plan Assessment & Plan Type 2 diabetes  mellitus Fasting blood sugars in the 120s and postprandial levels between 125-135 mg/dL. Considering resuming metformin  for better glycemic control. No history of hypoglycemia with metformin  use. Discussed potential benefits of metformin  for cancer prevention, though evidence is not conclusive. - Ordered A1c and lipid panel - Will consider resuming metformin  for diabetes management  Malignant neoplasm of brain under active treatment Undergoing monthly Temodar  treatment for brain cancer. Tolerating treatment well despite increased dosage. Recent MRI showed possible new lesions, but these may be residual from previous treatment. Prognosis is guarded with a goal of extending life expectancy beyond the typical 12-18 months post-diagnosis. - Continue monthly Temodar  treatment - Monitor blood counts regularly - Will schedule MRI in January  Otitis media with effusion, right ear, post-tympanostomy tube Post-tympanostomy tube placement in the right ear due to radiation-induced scarring and fluid accumulation. Experiencing some fluid retention but overall improvement in hearing and pressure symptoms. Tube facilitates drainage of fluid from the middle ear. - Continue current ear drop regimen with dexamethasone  and Cipro   Radiation-induced hearing loss, right ear Hearing loss in the right ear attributed to radiation therapy. Recent tympanostomy tube placement has improved hearing and reduced fluid-related symptoms. - Continue to monitor hearing status and ear symptoms  Acute bronchitis Recent onset of bronchitis symptoms, likely secondary to exposure to a sick contact. Symptoms include cough and mucus production, but no fever or significant respiratory distress. Currently managed with Mucinex and Z-Pak. - Continue Mucinex for symptom relief  Diarrhea, antibiotic-associated Diarrhea likely secondary to Augmentin  use. Symptoms have been persistent but are expected with this antibiotic. - Continue to  monitor symptoms and manage as needed  Edema, intermittent Intermittent edema managed with Lasix  as needed. Prefers to avoid dehydration by limiting Lasix  use to days with significant swelling. - Continue Lasix  as needed for edema management  Fatty liver disease Mild fatty liver disease. Monitoring triglyceride levels as part of lipid panel. - Ordered lipid panel to monitor triglyceride levels  Cervicogenic headache Headaches attributed to cervicogenic origin, likely due to poor neck posture. Symptoms improve with movement and neck exercises. - Continue neck exercises and posture correction    Follow up: 6-12 mo for recheck  Orders Placed This Encounter  Procedures   Flu vaccine HIGH DOSE PF(Fluzone Trivalent)   Hemoglobin A1c   Lipid panel   TSH   No orders of the defined types were placed in this encounter.     Body mass index is 40.84 kg/m. Wt Readings from Last 3 Encounters:  04/23/24 253 lb (114.8 kg)  04/20/24 252 lb (114.3 kg)  04/05/24 255 lb 9.6 oz (115.9 kg)     Patient Active Problem List   Diagnosis Date Noted   Thyroid  nodule 10/07/2023    Priority: High    Thyroid  ultrasound 09/2023, 2 nodules, one is > 1.5 and FNA aspiration/bx is recommended. Biopsy done by Dr. Eletha, + atypia present. Sent for AFFIRMA testing. 11/2023, BENIGN, will f/u with dr. Eletha in 1 year    Controlled type 2 diabetes mellitus without complication, without long-term current use of insulin  (HCC) 05/27/2022    Priority: High   Morbid obesity (HCC) 08/07/2020    Priority: High   Attention deficit hyperactivity disorder (ADHD), combined type 10/19/2017    Priority: High   OSA on CPAP, followed by Neurology/Sleep 09/26/2016    Priority: High    Dr. Chalice. Sleep test  repeat: 2023, mild, continue cpap. Auto titrated    Hypertension associated with diabetes (HCC) 01/31/2016    Priority: High   Combined hyperlipidemia associated with type 2 diabetes mellitus (HCC) 05/10/2015     Priority: High   Paroxysmal SVT (supraventricular tachycardia) 09/19/2011    Priority: High    SVT/PACs controlled on BB; managed by cardiology. No CAD    Osteopenia after menopause 06/13/2023    Priority: Medium     DEXA 12/2022: Dr. Ronal Pinal, GYN, lowest T = - 1.7 left femur; routine guidance and recheck 2 years    Esophageal stricture 09/02/2022    Priority: Medium     EGD 08/2022: dilated. Otherwise normal    Bilateral hip bursitis 05/06/2019    Priority: Medium    Nephrolithiasis 03/29/2018    Priority: Medium     H/o recurrent pyelo/urosepsis; s/p stone retrieval and stenting.  Has nonobstructive stone that persists.  Dr. Alvaro    Anticardiolipin syndrome     Priority: Medium    Recurrent circadian rhythm sleep disorder, shift work type 03/31/2017    Priority: Medium    IBS (irritable bowel syndrome), diarrhea predominant 03/17/2017    Priority: Medium    NAFLD (nonalcoholic fatty liver disease) 94/89/7981    Priority: Medium    Bilateral lower extremity edema 09/02/2022    Priority: Low   Diverticulosis of colon 05/16/2018    Priority: Low    Colonoscopy 08/2022: tics throughout, o/w normal.    Drug-induced pancreatitis, 2015, thought to be due to [facility] 03/29/2018    Priority: Low   History of vitreous detachment OU, HTN retinopathy OU, High myopia OU, Cataracts OU, Vitreous floaters OU, Keratoconjunctivitis sicca OU not specified as Sjogren's 03/29/2018    Priority: Low   History of sepsis 02/01/2016    Priority: Low    Due to urosepsis: had acute short lived afib/psvt. Neg cardiac eval since.     Vitamin D  deficiency, on Vit D 2000 IU daily 05/10/2015    Priority: Low   Cancer of temporal lobe (HCC) 01/09/2024   Glioblastoma, IDH-wildtype (HCC) 12/29/2023   Intracranial mass 12/26/2023   Multiple thyroid  nodules 11/19/2023   Sprain of interphalangeal joint of right little finger 06/03/2023   Shifting sleep-work schedule, affecting sleep 10/07/2022    Sleep related bruxism 10/07/2022   Pain of right hip joint 03/04/2019   Health Maintenance  Topic Date Due   Diabetic kidney evaluation - Urine ACR  05/24/2022   Mammogram  06/11/2023   FOOT EXAM  03/03/2024   COVID-19 Vaccine (4 - 2025-26 season) 05/09/2024 (Originally 01/19/2024)   HEMOGLOBIN A1C  06/29/2024   OPHTHALMOLOGY EXAM  08/21/2024   Diabetic kidney evaluation - eGFR measurement  03/04/2025   Bone Density Scan  04/06/2025   DTaP/Tdap/Td (3 - Td or Tdap) 10/31/2029   Colonoscopy  08/25/2032   Pneumococcal Vaccine: 50+ Years  Completed   Influenza Vaccine  Completed   Hepatitis C Screening  Completed   Zoster Vaccines- Shingrix   Completed   Meningococcal B Vaccine  Aged Out   Immunization History  Administered Date(s) Administered   INFLUENZA, HIGH DOSE SEASONAL PF 01/21/2013, 04/23/2024   Influenza, Seasonal, Injecte, Preservative Fre 03/04/2023   Influenza,inj,Quad PF,6+ Mos 01/21/2013, 03/09/2018, 03/19/2019, 03/13/2021, 02/26/2022   Influenza,inj,quad, With Preservative 02/18/2015   Influenza-Unspecified 02/18/2015, 02/23/2016, 03/13/2017, 03/20/2020   Moderna Sars-Covid-2 Vaccination 04/20/2019   PFIZER(Purple Top)SARS-COV-2 Vaccination 05/15/2019, 06/05/2019   PNEUMOCOCCAL CONJUGATE-20 05/07/2021   Pneumococcal Polysaccharide-23 08/01/2008   Rabies Immune Globulin  10/14/2011,  10/17/2011, 10/23/2011, 10/31/2011   Rabies, IM 10/14/2011, 10/14/2011, 10/17/2011, 10/23/2011, 10/31/2011   Rabies, intradermal 10/14/2011   Tdap 06/08/2007, 11/01/2019   Zoster Recombinant(Shingrix ) 11/19/2021, 05/31/2022   We updated and reviewed the patient's past history in detail and it is documented below. Allergies: Patient is allergic to ace inhibitors, dust mite extract, oxycodone  hcl, oxycodone -acetaminophen , tree extract, vicodin [hydrocodone-acetaminophen ], victoza [liraglutide], and adhesive [tape]. Past Medical History Patient  has a past medical history of ADD (attention  deficit disorder), Allergy, Anticardiolipin syndrome, Atrial bigeminy (07/2022), Bilateral swelling of feet, Cholelithiasis, Chronic depressive disorder, Chronic diastolic CHF (congestive heart failure) (HCC), Circadian rhythm sleep disorder, shift work, DDD (degenerative disc disease), lumbosacral (05/2012), Diabetes (HCC), Diverticulosis, Fatty liver, GERD (gastroesophageal reflux disease), Heart murmur, Hepatic steatosis, History of hiatal hernia (2017), HLA B27 (HLA B27 positive), pancreatitis, Hyperlipidemia, Hypertension, IBS (irritable bowel syndrome), Ischemic colitis, Lactose intolerance, Lumbar scoliosis (2010), Migraine headache without aura (05/10/2015), Multiple thyroid  nodules, Nephrolithiasis (2014), Obesity due to excess calories with serious comorbidity (05/24/2019), Palpitations, PONV (postoperative nausea and vomiting), Prediabetes, Presbyopia, Right bundle branch block, Sepsis (HCC) (01/2016), Sleep apnea, Sleep hypopnea, and Vitamin D  deficiency. Past Surgical History Patient  has a past surgical history that includes Colonoscopy w/ biopsies (multiple); Esophagogastroduodenoscopy (multiple); Knee arthroscopy (Left, 11/2008); Dilation and curettage of uterus (11/2009); Cervical cone biopsy; Mass excision (Left, 12/24/2012); Diagnostic laparoscopy; Cholecystectomy (N/A, 10/26/2014); Transobturator sling; Laparoscopic vaginal hysterectomy with salpingo oophorectomy (Bilateral, 10/03/2015); LEFT FINGER SURGERY; Breast biopsy (Left); Eye surgery; LASIK (1990); Dupuytren contracture release (Left); Liver biopsy; Photorefractive keratotomy (1994); Cystoscopy with retrograde pyelogram, ureteroscopy and stent placement (Left, 06/26/2018); Holmium laser application (Left, 06/26/2018); Radial keratotomy (1994); Craniotomy (Right, 12/29/2023); and Application of cranial navigation (N/A, 12/29/2023). Family History: Patient family history includes Coronary artery disease in her mother; Depression in her  mother; Diabetes in her mother; Heart attack in her father and mother; Heart disease in her father and mother; Hyperlipidemia in her mother; Hypertension in her brother, mother, and sister; Kidney disease in her mother; Obesity in her mother; Sleep apnea in her mother; Sudden death in her father. Social History:  Patient  reports that she has never smoked. She has never used smokeless tobacco. She reports current alcohol use. She reports that she does not use drugs.  Review of Systems: Constitutional: negative for fever or malaise Ophthalmic: negative for photophobia, double vision or loss of vision Cardiovascular: negative for chest pain, dyspnea on exertion, or new LE swelling Respiratory: negative for SOB or persistent cough Gastrointestinal: negative for abdominal pain, change in bowel habits or melena Genitourinary: negative for dysuria or gross hematuria, no abnormal uterine bleeding or disharge Musculoskeletal: negative for new gait disturbance or muscular weakness Integumentary: negative for new or persistent rashes, no breast lumps Neurological: negative for TIA or stroke symptoms Psychiatric: negative for SI or delusions Allergic/Immunologic: negative for hives  Patient Care Team    Relationship Specialty Notifications Start End  [name], MD PCP - General Family Medicine  04/07/19   Verlin Lonni BIRCH, MD PCP - Cardiology Cardiology  08/21/20   Camella Fallow, MD Consulting Physician Orthopedic Surgery  02/11/19   Verlin Lonni BIRCH, MD Consulting Physician Cardiology  05/24/19   Suzan Morrison, NP Referring Physician Gastroenterology  05/24/19   Dohmeier, Dedra, MD Consulting Physician Neurology  05/24/19   Alvaro Ricardo KATHEE Mickey., MD Consulting Physician Urology  05/24/19   Avram Lupita BRAVO, MD Consulting Physician Gastroenterology  05/24/19     Objective  Vitals: BP 136/88   Pulse 73  Temp 97.9 F (36.6 C)   Ht 5' 6 (1.676 m)   Wt 253 lb (114.8 kg)   LMP  05/20/2008   SpO2 96%   BMI 40.84 kg/m  General:  Well developed, well nourished, no acute distress  Psych:  Alert and orientedx3,normal mood and affect HEENT:  Normocephalic, atraumatic, non-icteric sclera,  supple neck without adenopathy, mass or thyromegaly Cardiovascular:  Normal S1, S2, RRR without gallop, rub or murmur Respiratory:  Good breath sounds bilaterally, CTAB with normal respiratory effort Gastrointestinal: normal bowel sounds, soft, non-tender, no noted masses. No HSM MSK: extremities without edema, joints without erythema or swelling Neurologic:    Mental status is normal.  Gross motor and sensory exams are normal.  No tremor  Commons side effects, risks, benefits, and alternatives for medications and treatment plan prescribed today were discussed, and the patient expressed understanding of the given instructions. Patient is instructed to call or message via MyChart if he/she has any questions or concerns regarding our treatment plan. No barriers to understanding were identified. We discussed Red Flag symptoms and signs in detail. Patient expressed understanding regarding what to do in case of urgent or emergency type symptoms.  Medication list was reconciled, printed and provided to the patient in AVS. Patient instructions and summary information was reviewed with the patient as documented in the AVS. This note was prepared with assistance of Dragon voice recognition software. Occasional wrong-word or sound-a-like substitutions may have occurred due to the inherent limitations of voice recognition software

## 2024-04-23 NOTE — Patient Instructions (Signed)
 Please return in 6 months to recheck diabetes and blood pressure and recheck  I will release your lab results to you on your MyChart account with further instructions. You may see the results before I do, but when I review them I will send you a message with my report or have my assistant call you if things need to be discussed. Please reply to my message with any questions. Thank you!   If you have any questions or concerns, please don't hesitate to send me a message via MyChart or call the office at [phone]. Thank you for visiting with us  today! It's our pleasure caring for you.

## 2024-04-25 ENCOUNTER — Other Ambulatory Visit: Payer: Self-pay

## 2024-04-25 ENCOUNTER — Ambulatory Visit: Payer: Self-pay | Admitting: Family Medicine

## 2024-04-25 NOTE — Progress Notes (Signed)
 See mychart note Dear Alexandra Walker, Your lab results look good. Your diabetic control is stable at 6.3. I don't feel that you need to restart the metformin  back at this time; although a low dose would not be harmful.  Happy holidays.  Sincerely, Dr. Jodie

## 2024-04-27 ENCOUNTER — Telehealth: Payer: Self-pay | Admitting: *Deleted

## 2024-04-27 ENCOUNTER — Other Ambulatory Visit (HOSPITAL_BASED_OUTPATIENT_CLINIC_OR_DEPARTMENT_OTHER): Payer: Self-pay

## 2024-04-27 ENCOUNTER — Other Ambulatory Visit: Payer: Self-pay | Admitting: Internal Medicine

## 2024-04-27 ENCOUNTER — Other Ambulatory Visit: Payer: Self-pay

## 2024-04-27 MED ORDER — DEXAMETHASONE 2 MG PO TABS
2.0000 mg | ORAL_TABLET | Freq: Every day | ORAL | 0 refills | Status: DC
  Filled 2024-04-27: qty 30, 30d supply, fill #0

## 2024-04-27 NOTE — Telephone Encounter (Signed)
 Received PC from patient's sister Rudolph - she & patient's daughter Annitta have some concerns.  Audryana has become more lethargic, sleeping up to 16 hours a day, and is not eating very much.  Maggie has to remind her to eat.  This has been going on since thanksgiving.  She has lab & MD appointment here on 05/04/24.   Dr Buckley informed, he is prescribing decadron  2 mg daily.  Returned PC to [place], informed her of rx for steroid, she verbalizes understanding.

## 2024-04-28 ENCOUNTER — Encounter: Payer: Self-pay | Admitting: Cardiovascular Disease

## 2024-05-03 ENCOUNTER — Ambulatory Visit (HOSPITAL_BASED_OUTPATIENT_CLINIC_OR_DEPARTMENT_OTHER): Admitting: Obstetrics & Gynecology

## 2024-05-03 ENCOUNTER — Other Ambulatory Visit: Payer: Self-pay

## 2024-05-04 ENCOUNTER — Telehealth: Payer: Self-pay

## 2024-05-04 ENCOUNTER — Inpatient Hospital Stay: Attending: Internal Medicine

## 2024-05-04 ENCOUNTER — Inpatient Hospital Stay: Attending: Internal Medicine | Admitting: Internal Medicine

## 2024-05-04 ENCOUNTER — Other Ambulatory Visit: Payer: Self-pay

## 2024-05-04 ENCOUNTER — Inpatient Hospital Stay: Admitting: Licensed Clinical Social Worker

## 2024-05-04 ENCOUNTER — Other Ambulatory Visit (HOSPITAL_COMMUNITY): Payer: Self-pay

## 2024-05-04 VITALS — BP 153/76 | HR 61 | Temp 97.5°F | Resp 17 | Ht 66.0 in | Wt 252.0 lb

## 2024-05-04 DIAGNOSIS — E1165 Type 2 diabetes mellitus with hyperglycemia: Secondary | ICD-10-CM | POA: Diagnosis not present

## 2024-05-04 DIAGNOSIS — Z818 Family history of other mental and behavioral disorders: Secondary | ICD-10-CM | POA: Insufficient documentation

## 2024-05-04 DIAGNOSIS — Z9079 Acquired absence of other genital organ(s): Secondary | ICD-10-CM | POA: Insufficient documentation

## 2024-05-04 DIAGNOSIS — Z8349 Family history of other endocrine, nutritional and metabolic diseases: Secondary | ICD-10-CM | POA: Insufficient documentation

## 2024-05-04 DIAGNOSIS — Z90722 Acquired absence of ovaries, bilateral: Secondary | ICD-10-CM | POA: Insufficient documentation

## 2024-05-04 DIAGNOSIS — E785 Hyperlipidemia, unspecified: Secondary | ICD-10-CM | POA: Insufficient documentation

## 2024-05-04 DIAGNOSIS — Z9049 Acquired absence of other specified parts of digestive tract: Secondary | ICD-10-CM | POA: Insufficient documentation

## 2024-05-04 DIAGNOSIS — Z885 Allergy status to narcotic agent status: Secondary | ICD-10-CM | POA: Diagnosis not present

## 2024-05-04 DIAGNOSIS — Z7982 Long term (current) use of aspirin: Secondary | ICD-10-CM | POA: Insufficient documentation

## 2024-05-04 DIAGNOSIS — Z90710 Acquired absence of both cervix and uterus: Secondary | ICD-10-CM | POA: Insufficient documentation

## 2024-05-04 DIAGNOSIS — Z87442 Personal history of urinary calculi: Secondary | ICD-10-CM | POA: Insufficient documentation

## 2024-05-04 DIAGNOSIS — C719 Malignant neoplasm of brain, unspecified: Secondary | ICD-10-CM

## 2024-05-04 DIAGNOSIS — C712 Malignant neoplasm of temporal lobe: Secondary | ICD-10-CM | POA: Insufficient documentation

## 2024-05-04 DIAGNOSIS — Z888 Allergy status to other drugs, medicaments and biological substances status: Secondary | ICD-10-CM | POA: Diagnosis not present

## 2024-05-04 DIAGNOSIS — M419 Scoliosis, unspecified: Secondary | ICD-10-CM | POA: Diagnosis not present

## 2024-05-04 DIAGNOSIS — Z833 Family history of diabetes mellitus: Secondary | ICD-10-CM | POA: Insufficient documentation

## 2024-05-04 DIAGNOSIS — Z8249 Family history of ischemic heart disease and other diseases of the circulatory system: Secondary | ICD-10-CM | POA: Diagnosis not present

## 2024-05-04 DIAGNOSIS — E6609 Other obesity due to excess calories: Secondary | ICD-10-CM | POA: Insufficient documentation

## 2024-05-04 DIAGNOSIS — G4700 Insomnia, unspecified: Secondary | ICD-10-CM | POA: Insufficient documentation

## 2024-05-04 DIAGNOSIS — K589 Irritable bowel syndrome without diarrhea: Secondary | ICD-10-CM | POA: Diagnosis not present

## 2024-05-04 DIAGNOSIS — Z83438 Family history of other disorder of lipoprotein metabolism and other lipidemia: Secondary | ICD-10-CM | POA: Insufficient documentation

## 2024-05-04 DIAGNOSIS — K760 Fatty (change of) liver, not elsewhere classified: Secondary | ICD-10-CM | POA: Insufficient documentation

## 2024-05-04 DIAGNOSIS — E11A Type 2 diabetes mellitus without complications in remission: Secondary | ICD-10-CM

## 2024-05-04 DIAGNOSIS — Z79630 Long term (current) use of alkylating agent: Secondary | ICD-10-CM | POA: Insufficient documentation

## 2024-05-04 DIAGNOSIS — Z8419 Family history of other disorders of kidney and ureter: Secondary | ICD-10-CM | POA: Insufficient documentation

## 2024-05-04 DIAGNOSIS — I110 Hypertensive heart disease with heart failure: Secondary | ICD-10-CM | POA: Insufficient documentation

## 2024-05-04 DIAGNOSIS — Z79899 Other long term (current) drug therapy: Secondary | ICD-10-CM | POA: Diagnosis not present

## 2024-05-04 DIAGNOSIS — Z8719 Personal history of other diseases of the digestive system: Secondary | ICD-10-CM | POA: Insufficient documentation

## 2024-05-04 LAB — CBC WITH DIFFERENTIAL (CANCER CENTER ONLY)
Abs Immature Granulocytes: 0.03 K/uL (ref 0.00–0.07)
Basophils Absolute: 0 K/uL (ref 0.0–0.1)
Basophils Relative: 0 %
Eosinophils Absolute: 0.1 K/uL (ref 0.0–0.5)
Eosinophils Relative: 2 %
HCT: 41.1 % (ref 36.0–46.0)
Hemoglobin: 14 g/dL (ref 12.0–15.0)
Immature Granulocytes: 1 %
Lymphocytes Relative: 14 %
Lymphs Abs: 0.8 K/uL (ref 0.7–4.0)
MCH: 28.6 pg (ref 26.0–34.0)
MCHC: 34.1 g/dL (ref 30.0–36.0)
MCV: 84 fL (ref 80.0–100.0)
Monocytes Absolute: 0.5 K/uL (ref 0.1–1.0)
Monocytes Relative: 8 %
Neutro Abs: 4.7 K/uL (ref 1.7–7.7)
Neutrophils Relative %: 75 %
Platelet Count: 216 K/uL (ref 150–400)
RBC: 4.89 MIL/uL (ref 3.87–5.11)
RDW: 12 % (ref 11.5–15.5)
WBC Count: 6.2 K/uL (ref 4.0–10.5)
nRBC: 0 % (ref 0.0–0.2)

## 2024-05-04 LAB — CMP (CANCER CENTER ONLY)
ALT: 14 U/L (ref 0–44)
AST: 16 U/L (ref 15–41)
Albumin: 4.2 g/dL (ref 3.5–5.0)
Alkaline Phosphatase: 98 U/L (ref 38–126)
Anion gap: 10 (ref 5–15)
BUN: 12 mg/dL (ref 8–23)
CO2: 24 mmol/L (ref 22–32)
Calcium: 9.4 mg/dL (ref 8.9–10.3)
Chloride: 105 mmol/L (ref 98–111)
Creatinine: 0.7 mg/dL (ref 0.44–1.00)
GFR, Estimated: >60 mL/min (ref >60)
Glucose, Bld: 154 mg/dL — ABNORMAL HIGH (ref 70–99)
Potassium: 3.9 mmol/L (ref 3.5–5.1)
Sodium: 139 mmol/L (ref 135–145)
Total Bilirubin: 0.4 mg/dL (ref 0.0–1.2)
Total Protein: 6.7 g/dL (ref 6.5–8.1)

## 2024-05-04 LAB — HEMOGLOBIN A1C
Hgb A1c MFr Bld: 6.4 % — ABNORMAL HIGH (ref 4.8–5.6)
Mean Plasma Glucose: 136.98 mg/dL

## 2024-05-04 MED ORDER — TEMOZOLOMIDE 100 MG PO CAPS
200.0000 mg | ORAL_CAPSULE | Freq: Every day | ORAL | 0 refills | Status: DC
  Filled 2024-05-04: qty 10, 28d supply, fill #0
  Filled 2024-05-04: qty 10, 5d supply, fill #0

## 2024-05-04 MED ORDER — TEMOZOLOMIDE 250 MG PO CAPS
250.0000 mg | ORAL_CAPSULE | Freq: Every day | ORAL | 0 refills | Status: DC
  Filled 2024-05-04: qty 5, 28d supply, fill #0
  Filled 2024-05-04: qty 5, 5d supply, fill #0

## 2024-05-04 NOTE — Telephone Encounter (Signed)
 A user error has taken place: encounter opened in error, closed for administrative reasons.

## 2024-05-04 NOTE — Progress Notes (Signed)
 Specialty Pharmacy Ongoing Clinical Assessment Note  I spoke with the patient's daughter, Maggie. DOLL FRAZEE is a 68 y.o. female who is being followed by the specialty pharmacy service for RxSp Oncology   Patient's specialty medication(s) reviewed today: Temozolomide  (TEMODAR )   Missed doses in the last 4 weeks: 0   Patient/Caregiver did not have any additional questions or concerns.   Therapeutic benefit summary: Patient is achieving benefit   Adverse events/side effects summary: Experienced adverse events/side effects (nausea, has compazine  on hand PRN)   Patient's therapy is appropriate to: Continue    Goals Addressed             This Visit's Progress    Maintain optimal adherence to therapy   On track    Patient is on track. Patient will maintain adherence      Slow Disease Progression   On track    Patient is on track. Patient will maintain adherence.  Dr. Buckley reported that patient was clinically stable today at her office visit.         Follow up: 3 months  Silvano LOISE Dolly Specialty Pharmacist

## 2024-05-04 NOTE — Progress Notes (Signed)
 Specialty Pharmacy Refill Coordination Note  Alexandra Walker is a 68 y.o. female contacted today regarding refills of specialty medication(s) Temozolomide  (TEMODAR )   Patient requested [name] at [facility] at [place] date: 05/05/24   Medication will be filled on: 05/05/24   Patient's daughter is aware that a PA is pending prior to pickup, routed to [employer].

## 2024-05-04 NOTE — Progress Notes (Signed)
 [facility] at [facility] [address]  [place], [place] [address] [phone]   Interval Evaluation  Date of Service: 05/04/2024 Patient Name: Alexandra Walker Patient MRN: [id number] Patient DOB: [birth year] Provider: Arthea MARLA Manns, MD  Identifying Statement:  Alexandra Walker is a 68 y.o. female with right temporal glioblastoma    Oncologic History: Oncology History  Glioblastoma, IDH-wildtype (HCC)  12/29/2023 Surgery   Right temporal craniotomy, resection with Dr. Rosslyn; path is grade 4 glioma   01/26/2024 - 03/05/2024 Radiation Therapy   6 weeks IMRT, concurrent Temozolomide  75mg /m2 with Dr. Izell   04/09/2024 -  Chemotherapy   Initiates adjuvant 5-day Temozolomide  150-200mg /m2      Interval History: Alexandra Walker presents today for follow up, now having completed cycle #1 of 5 day temodar .  She tolerated treatment well overall aside from some nausea the first night.  Feeling better energy-wise since resuming the decadron  2mg  daily.  No new or progressive deficits otherwise.  Remains independent with gait.  No recurrence of UTI symptoms.  Denies seizures, headaches.  H+P (01/08/24) Patient presented to neurologic attention with several days history of difficulty driving/navigating, some disorganization at work, and headaches.  CNS imaging demonstrated an enhancing mass within the right temporal lobe, c/w primary brain tumor.  She underwent craniotomy, resection with Dr. Rosslyn on 12/29/23; pathology demonstrated grade 4 glioma, IDH pending.  Since surgery, she feels closer to her prior baseline, independent with gait.  Decadron  is at 4mg  twice per day, which has caused some insomnia and elevated blood sugar.  Still on post-op Keppra .    Medications: Current Outpatient Medications on File Prior to Visit  Medication Sig Dispense Refill   aspirin  325 MG tablet Take 325 mg by mouth daily.     butalbital -acetaminophen -caffeine  (FIORICET )  50-325-40 MG tablet Take 2 tablets by mouth every 4 (four) hours as needed for headache (moderate headache). 60 tablet 0   cholecalciferol  (VITAMIN D ) 1000 UNITS tablet Take 2,000 Units by mouth daily.      cyanocobalamin  (VITAMIN B12) 1000 MCG tablet Take 1,000 mcg by mouth daily.     dexamethasone  (DECADRON ) 2 MG tablet Take 1 tablet (2 mg total) by mouth daily. 30 tablet 0   fluticasone  (FLONASE ) 50 MCG/ACT nasal spray Place 1 spray into both nostrils in the morning and at bedtime. 16 g 6   furosemide  (LASIX ) 40 MG tablet Take 1 tablet (40 mg total) by mouth daily. 90 tablet 1   losartan  (COZAAR ) 100 MG tablet Take 1 tablet (100 mg total) by mouth daily. 90 tablet 3   Lutein 6 MG CAPS Take 6 mg by mouth daily.     metoprolol  succinate (TOPROL -XL) 100 MG 24 hr tablet Take 1 tablet (100 mg total) by mouth daily with or immediately following a meal. 90 tablet 2   Multiple Vitamin (MULTIVITAMIN WITH MINERALS) TABS tablet Take 1 tablet by mouth daily.     ondansetron  (ZOFRAN ) 8 MG tablet Take 1 tablet (8 mg total) by mouth every 8 (eight) hours as needed for nausea or vomiting. May take 30-60 minutes prior to Temodar  administration if nausea/vomiting occurs as needed. 30 tablet 1   potassium chloride  SA (KLOR-CON  M) 20 MEQ tablet Take 1 tablet (20 mEq total) by mouth daily. 90 tablet 1   prochlorperazine  (COMPAZINE ) 10 MG tablet Take 1 tablet (10 mg total) by mouth every 6 (six) hours as needed for nausea or vomiting. 30 tablet 1   rosuvastatin  (CRESTOR )  20 MG tablet Take 1 tablet (20 mg total) by mouth daily. 90 tablet 3   temozolomide  (TEMODAR ) 100 MG capsule Take 1 capsule (100 mg total) by mouth daily. (Take with ONE 250 mg capsule for total daily dose of 350 mg). Take for 5 days on, 23 days off. Repeat every 28 days. May take on an empty stomach to decrease nausea & vomiting. 5 capsule 0   temozolomide  (TEMODAR ) 250 MG capsule Take 1 capsule (250 mg total) by mouth daily. (Take with ONE 100 mg  capsule for total daily dose of 350 mg). Take for 5 days on, 23 days off. Repeat every 28 days. May take on an empty stomach to decrease nausea & vomiting. 5 capsule 0   traZODone  (DESYREL ) 50 MG tablet Take 0.5-1 tablets (25-50 mg total) by mouth at bedtime as needed for sleep. 30 tablet 3   No current facility-administered medications on file prior to visit.    Allergies:  Allergies  Allergen Reactions   Ace Inhibitors Other (See Comments)    coughing   Dust Mite Extract Other (See Comments)    Sneezing and congestion   Oxycodone  Hcl Other (See Comments) and Nausea And Vomiting    NDC Rniz:[id number]   Oxycodone -Acetaminophen  Nausea And Vomiting   Tree Extract Other (See Comments)    Tree pollen and scrub pollen    Vicodin [Hydrocodone-Acetaminophen ] Nausea And Vomiting   Victoza [Liraglutide] Other (See Comments)    pancreatitis   Adhesive [Tape] Rash    Not on stomach   Past Medical History:  Past Medical History:  Diagnosis Date   ADD (attention deficit disorder)    Allergy    Anticardiolipin syndrome    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   lasted about 6 hours, Echo did not show anything   Bilateral swelling of feet    Cholelithiasis    Chronic depressive disorder    Chronic diastolic CHF (congestive heart failure) (HCC)    Circadian rhythm sleep disorder, shift work    DDD (degenerative disc disease), 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   Diabetes (HCC)    Diverticulosis    Minimal   Fatty liver    GERD (gastroesophageal reflux disease)    Heart murmur    Hepatic steatosis    History of hiatal hernia 2017   Small   HLA B27 (HLA B27 positive)    Hx of pancreatitis    Hyperlipidemia    Hypertension    IBS (irritable bowel syndrome)    Ischemic colitis    Lactose intolerance    Lumbar scoliosis 2010   Mild   Migraine headache without aura 05/10/2015   Multiple thyroid  nodules    Nephrolithiasis 2014   Left, minimal   Obesity due to excess calories with serious comorbidity  05/24/2019   Palpitations    PONV (postoperative nausea and vomiting)    Prediabetes    Presbyopia    Right bundle branch block    Sepsis (HCC) 01/2016   Sleep apnea    Sleep hypopnea    Vitamin D  deficiency    Past Surgical History:  Past Surgical History:  Procedure Laterality Date   APPLICATION OF CRANIAL NAVIGATION N/A 12/29/2023   Procedure: COMPUTER-ASSISTED NAVIGATION, FOR CRANIAL PROCEDURE;  Surgeon: Rosslyn Dino HERO, MD;  Location: [facility] OR;  Service: Neurosurgery;  Laterality: N/A;   BREAST BIOPSY Left    CERVICAL CONE BIOPSY     CHOLECYSTECTOMY N/A 10/26/2014   Procedure: LAPAROSCOPIC CHOLECYSTECTOMY;  Surgeon: Vicenta Poli, MD;  Location: [facility];  Service: General;  Laterality: N/A;   COLONOSCOPY W/ BIOPSIES  multiple   CRANIOTOMY Right 12/29/2023   Procedure: CRANIOTOMY TUMOR EXCISION;  Surgeon: Rosslyn Dino HERO, MD;  Location: [facility] OR;  Service: Neurosurgery;  Laterality: Right;   CYSTOSCOPY WITH RETROGRADE PYELOGRAM, URETEROSCOPY AND STENT PLACEMENT Left 06/26/2018   Procedure: CYSTOSCOPY WITH RETROGRADE PYELOGRAM, URETEROSCOPY AND STENT PLACEMENT;  Surgeon: Alvaro Hummer, MD;  Location: [facility];  Service: Urology;  Laterality: Left;   DIAGNOSTIC LAPAROSCOPY     DILATION AND CURETTAGE OF UTERUS  11/2009   DUPUYTREN CONTRACTURE RELEASE Left    x 3 , Dr Camella   ESOPHAGOGASTRODUODENOSCOPY  multiple   EYE SURGERY     HOLMIUM LASER APPLICATION Left 06/26/2018   Procedure: HOLMIUM LASER APPLICATION;  Surgeon: Alvaro Hummer, MD;  Location: [facility];  Service: Urology;  Laterality: Left;   KNEE ARTHROSCOPY Left 11/2008   LAPAROSCOPIC VAGINAL HYSTERECTOMY WITH SALPINGO OOPHORECTOMY Bilateral 10/03/2015   Procedure: LAPAROSCOPIC ASSISTED VAGINAL HYSTERECTOMY WITH SALPINGECTOMY;  Surgeon: Shanda SHAUNNA Muscat, MD;  Location: [facility];  Service: Gynecology;  Laterality: Bilateral;   LASIK  1990   RK   LEFT FINGER SURGERY      LIVER BIOPSY     MASS EXCISION Left 12/24/2012   Procedure: LEFT EXCISION OF PALMAR MASS X TWO;  Surgeon: Elsie Camella, MD;  Location: [facility];  Service: Orthopedics;  Laterality: Left;   PHOTOREFRACTIVE KERATOTOMY  1994   RADIAL KERATOTOMY  1994   TRANSOBTURATOR SLING     Social History:  Social History   Socioeconomic History   Marital status: Divorced    Spouse name: Not on file   Number of children: 1   Years of education: Not on file   Highest education level: Master's degree (e.g., MA, MS, MEng, MEd, MSW, MBA)  Occupational History   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   Social Drivers of Health   Tobacco Use: Low Risk (04/23/2024)   Patient History    Smoking Tobacco Use: Never    Smokeless Tobacco Use: Never    Passive Exposure: Not on file  Financial Resource Strain: Low Risk (04/23/2024)   Overall Financial Resource Strain (CARDIA)    Difficulty of Paying Living Expenses: Not hard at all  Food Insecurity: No Food Insecurity (04/23/2024)   Epic    Worried About Radiation Protection Practitioner of Food in the Last Year: Never true    Ran Out of Food in the Last Year: Never true  Transportation Needs: No Transportation Needs (04/23/2024)   Epic    Lack of Transportation (Medical): No    Lack of Transportation (Non-Medical): No  Physical Activity: Insufficiently Active (04/23/2024)   Exercise Vital Sign    Days of Exercise per Week: 1 day    Minutes of Exercise per Session: 30 min  Stress: Stress Concern Present (04/23/2024)    [employer] of Occupational Health - Occupational Stress Questionnaire    Feeling of  Stress: To some extent  Social Connections: Moderately Integrated (04/23/2024)   Social Connection and Isolation Panel    Frequency of Communication with Friends and Family: Twice a week    Frequency of Social Gatherings with Friends and Family: Once a week    Attends Religious Services: 1 to 4 times per year    Active Member of [employer] or Organizations: Yes    Attends Banker Meetings: 1 to 4 times per year    Marital Status: Divorced  Intimate Partner Violence: Not At Risk (01/09/2024)   Epic    Fear of Current or Ex-Partner: No    Emotionally Abused: No    Physically Abused: No    Sexually Abused: No  Depression (PHQ2-9): Low Risk (04/23/2024)   Depression (PHQ2-9)    PHQ-2 Score: 0  Alcohol Screen: Low Risk (04/23/2024)   Alcohol Screen    Last Alcohol Screening Score (AUDIT): 0  Housing: Unknown (04/23/2024)   Epic    Unable to Pay for Housing in the Last Year: No    Number of Times Moved in the Last Year: Not on file    Homeless in the Last Year: No  Utilities: Not At Risk (01/09/2024)   Epic    Threatened with loss of utilities: No  Health Literacy: Not on file   Family History:  Family History  Problem Relation Age of Onset   Coronary artery disease Mother    Hypertension Mother    Heart disease Mother    Heart attack Mother    Diabetes Mother    Hyperlipidemia Mother    Kidney disease Mother    Depression Mother    Sleep apnea Mother    Obesity Mother    Heart attack Father    Heart disease Father    Sudden death Father    Hypertension Sister    Hypertension Brother    Colon cancer Neg Hx    Colon polyps Neg Hx    Esophageal cancer Neg Hx    Rectal cancer Neg Hx    Stomach cancer Neg Hx     Review of Systems: Constitutional: Doesn't report fevers, chills or abnormal weight loss Eyes: Doesn't report blurriness of vision Ears, nose, mouth, throat, and  face: Doesn't report sore throat Respiratory: Doesn't report cough, dyspnea or wheezes Cardiovascular: Doesn't report palpitation, chest discomfort  Gastrointestinal:  Doesn't report nausea, constipation, diarrhea GU: Doesn't report incontinence Skin: Doesn't report skin rashes Neurological: Per HPI Musculoskeletal: Doesn't report joint pain Behavioral/Psych: Doesn't report anxiety  Physical Exam: Vitals:   05/04/24 1123  BP: (!) 153/76  Pulse: 61  Resp: 17  Temp: (!) 97.5 F (36.4 C)  SpO2: 98%    KPS: 90. General: Alert, cooperative, pleasant, in no acute distress Head: Normal EENT: No conjunctival injection or scleral icterus.  Lungs: Resp effort normal Cardiac: Regular rate Abdomen: Non-distended abdomen Skin: No rashes cyanosis or petechiae. Extremities: No clubbing or edema  Neurologic Exam: Mental Status: Awake, alert, attentive to examiner. Oriented to self and environment. Language is fluent with intact comprehension.  Cranial Nerves: Visual acuity is grossly normal. Visual fields are full. Extra-ocular movements intact. No ptosis. Face is symmetric Motor: Tone and bulk are normal. Power is full in both arms and legs. Reflexes are symmetric, no pathologic reflexes present.  Sensory: Intact to light touch Gait: Normal.   Labs: I have reviewed the data as listed    Component Value Date/Time   NA 139 05/04/2024 1059   NA 139  11/26/2023 1155   K 3.9 05/04/2024 1059   CL 105 05/04/2024 1059   CO2 24 05/04/2024 1059   GLUCOSE 154 (H) 05/04/2024 1059   BUN 12 05/04/2024 1059   BUN 16 11/26/2023 1155   CREATININE 0.70 05/04/2024 1059   CREATININE 0.76 05/05/2020 1631   CALCIUM  9.4 05/04/2024 1059   PROT 6.7 05/04/2024 1059   PROT 6.3 09/03/2021 1219   ALBUMIN 4.2 05/04/2024 1059   ALBUMIN 4.1 09/03/2021 1219   AST 16 05/04/2024 1059   ALT 14 05/04/2024 1059   ALKPHOS 98 05/04/2024 1059   BILITOT 0.4 05/04/2024 1059   GFRNONAA >60 05/04/2024 1059   GFRAA  >60 04/20/2018 1323   Lab Results  Component Value Date   WBC 6.2 05/04/2024   NEUTROABS 4.7 05/04/2024   HGB 14.0 05/04/2024   HCT 41.1 05/04/2024   MCV 84.0 05/04/2024   PLT 216 05/04/2024     Assessment/Plan Glioblastoma, IDH-wildtype (HCC)  [name] Pouch is clinically stable today, now having completed cycle #1 of adjuvant 5-day Temodar .  Prior MRI brain demonstrated small focus of enhancement R temporal within high dose RT field; favored treatment effect.  We recommended initiating treatment with cycle #2 Temozolomide  dose increased to 200mg /m2, on for five days and off for twenty three days in twenty eight day cycles. The patient will have a complete blood count performed on days 21 and 28 of each cycle, and a comprehensive metabolic panel performed on day 28 of each cycle. Labs may need to be performed more often. Zofran  will prescribed for home use for nausea/vomiting.   Informed consent was obtained verbally at bedside to proceed with oral chemotherapy.  Chemotherapy should be held for the following:  ANC less than 1,000  Platelets less than 100,000  LFT or creatinine greater than 2x ULN  If clinical concerns/contraindications develop  Decadron  can be decreased to 1mg  daily following chemo week, if tolerated.  We ask that MILITZA DEVERY return to clinic in 1 months with MRI brain prior to cycle #3, or sooner as needed.  All questions were answered. The patient knows to call the clinic with any problems, questions or concerns. No barriers to learning were detected.  The total time spent in the encounter was 40 minutes and more than 50% was on counseling and review of test results   Arthea MARLA Manns, MD Medical Director of Neuro-Oncology [facility] at [facility] 05/04/2024 11:35 AM

## 2024-05-05 ENCOUNTER — Other Ambulatory Visit: Payer: Self-pay

## 2024-05-06 ENCOUNTER — Ambulatory Visit (HOSPITAL_BASED_OUTPATIENT_CLINIC_OR_DEPARTMENT_OTHER): Admitting: Obstetrics & Gynecology

## 2024-05-06 ENCOUNTER — Encounter: Payer: Self-pay | Admitting: Internal Medicine

## 2024-05-10 ENCOUNTER — Encounter: Payer: Self-pay | Admitting: Internal Medicine

## 2024-05-10 NOTE — Progress Notes (Signed)
 Brain Tumor Assessment   Clinical social worker met with patient / caregiver daughter to complete assessments.      Patient distress screen score:     05/10/2024    9:51 AM  ONCBCN DISTRESS SCREENING  Screening Type Change in Status  How much distress have you been experiencing in the past week? (0-10) 5  Emotional concerns type Worry or anxiety  Spiritual/Religous concerns type Death, dying, or afterlife   Caregiver distress screen score: 5 Significant physical limits/changes identified:  No       MOCA score:     05/10/2024    9:49 AM 01/20/2024    1:21 PM  Montreal Cognitive Assessment   Visuospatial/ Executive (0/5) 4 5  Naming (0/3) 3 3  Attention: Read list of digits (0/2) 2 2  Attention: Read list of letters (0/1) 1 1  Attention: Serial 7 subtraction starting at 100 (0/3) 3 3  Language: Repeat phrase (0/2) 2 2  Language : Fluency (0/1) 1 1  Abstraction (0/2) 2 2  Delayed Recall (0/5) 4 0  Orientation (0/6) 5 6  Total 27 25   Significant findings: Overall score slightly improved- improvement in delayed recall  WHO Quality of Life scores:   Overall quality of life: 60   Physical health: 33   Psychological: 60   Social relationship: 80   Environment: 78 Significant findings: increased anxiety regarding prognosis     Devere JONELLE Manna, LCSW

## 2024-05-18 ENCOUNTER — Encounter: Payer: Self-pay | Admitting: Internal Medicine

## 2024-05-18 ENCOUNTER — Other Ambulatory Visit: Payer: Self-pay

## 2024-05-25 NOTE — Progress Notes (Deleted)
 Alexandra Walker

## 2024-05-25 NOTE — Progress Notes (Deleted)
 " [facility] [address] [place]. [place] [address] [phone]       OFFICE FOLLOW UP NOTE  [name] Date of Birth:  Sep 29, 1955 Medical Record Number:  [id number]    Primary neurologist: Dr. Chalice Reason for visit: CPAP follow-up    SUBJECTIVE:   CHIEF COMPLAINT:  No chief complaint on file.   Follow-up visit:  Prior visit: 11/26/2023 with Dr. Chalice  Brief HPI:   Alexandra Walker is a 69 y.o. female who is routinely followed for OSA on CPAP.  Prior sleep study 06/2021 confirming presence of sleep apnea and set up with new CPAP device 07/2021.  Last seen by Dr. Chalice 11/26/2023 for 3-week onset of new headaches described as an icepick headache.  She proceeded with MRA head which was unremarkable.  Due to persistent headaches now accompanied by dizziness, she proceeded to ED for further evaluation, she was diagnosed with UTI treated with antibiotics and discharged home.  She returned to ED on 8/8 for persistent headache and dizziness.  MRI brain completed which showed a large tumor in the posterior right temporal lobe with significant edema and underwent craniotomy with tumor resection, pathology demonstrated grade 4 glioma.  Completed course of radiation therapy and is currently receiving chemotherapy.  Repeated MRI brain 03/2024 which showed 2 new adjacent nodular areas of contrast-enhancement within right temporal lobe concerning for residual tumor.  During that time, Dr. Chalice also recommended repeat sleep study which was completed in 12/2023 which confirmed presence of mild obstructive sleep apnea with total AHI of 11.6/h accentuated during REM and supine sleep with REM AHI 16.8/h and supine AHI 24.6/h.  Recommended continuation of CPAP therapy with recommended pressure setting of 6-14 with EPR 2 and avoid supine sleep.    Interval history:  Patient returns for CPAP compliance visit.      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   Diabetes (HCC)    Diverticulosis    Minimal   Fatty liver    GERD (gastroesophageal reflux disease)    Heart murmur    Hepatic steatosis    History of hiatal hernia 2017   Small   HLA B27 (HLA B27 positive)    Hx of pancreatitis    Hyperlipidemia    Hypertension    IBS (irritable bowel syndrome)    Ischemic colitis    Lactose intolerance    Lumbar scoliosis 2010   Mild   Migraine headache without aura 05/10/2015   Multiple thyroid  nodules    Nephrolithiasis 2014   Left, minimal   Obesity due to excess calories with serious comorbidity 05/24/2019   Palpitations    PONV (postoperative nausea and vomiting)    Prediabetes    Presbyopia    Right bundle branch block    Sepsis (HCC) 01/2016   Sleep apnea    Sleep hypopnea    Vitamin D  deficiency     PSH:  Past Surgical History:  Procedure Laterality Date   APPLICATION OF CRANIAL NAVIGATION N/A 12/29/2023   Procedure: COMPUTER-ASSISTED NAVIGATION, FOR CRANIAL PROCEDURE;  Surgeon: Rosslyn Dino HERO, MD;  Location: [facility] OR;  Service: Neurosurgery;  Laterality: N/A;   BREAST BIOPSY Left    CERVICAL CONE BIOPSY     CHOLECYSTECTOMY N/A 10/26/2014   Procedure: LAPAROSCOPIC CHOLECYSTECTOMY;  Surgeon: Vicenta Poli, MD;  Location: [facility];  Service: General;  Laterality: N/A;   COLONOSCOPY W/ BIOPSIES  multiple   CRANIOTOMY Right 12/29/2023   Procedure: CRANIOTOMY TUMOR EXCISION;  Surgeon: Rosslyn Dino HERO, MD;   Location: [facility] OR;  Service: Neurosurgery;  Laterality: Right;   CYSTOSCOPY WITH RETROGRADE PYELOGRAM, URETEROSCOPY AND STENT PLACEMENT Left 06/26/2018   Procedure: CYSTOSCOPY WITH RETROGRADE PYELOGRAM, URETEROSCOPY AND STENT PLACEMENT;  Surgeon: Alvaro Hummer, MD;  Location: [facility];  Service: Urology;  Laterality: Left;   DIAGNOSTIC LAPAROSCOPY     DILATION AND CURETTAGE OF UTERUS  11/2009   DUPUYTREN CONTRACTURE RELEASE Left    x 3 , Dr Camella   ESOPHAGOGASTRODUODENOSCOPY  multiple   EYE SURGERY     HOLMIUM LASER APPLICATION Left 06/26/2018   Procedure: HOLMIUM LASER APPLICATION;  Surgeon: Alvaro Hummer, MD;  Location: [facility];  Service: Urology;  Laterality: Left;   KNEE ARTHROSCOPY Left 11/2008   LAPAROSCOPIC VAGINAL HYSTERECTOMY WITH SALPINGO OOPHORECTOMY Bilateral 10/03/2015   Procedure: LAPAROSCOPIC ASSISTED VAGINAL HYSTERECTOMY WITH SALPINGECTOMY;  Surgeon: Shanda SHAUNNA Muscat, MD;  Location: [facility];  Service: Gynecology;  Laterality: Bilateral;   LASIK  1990   RK   LEFT FINGER SURGERY     LIVER BIOPSY     MASS EXCISION Left 12/24/2012   Procedure: LEFT EXCISION OF PALMAR MASS X TWO;  Surgeon: Elsie Camella, MD;  Location: [facility];  Service: Orthopedics;  Laterality: Left;   PHOTOREFRACTIVE KERATOTOMY  1994   RADIAL KERATOTOMY  1994   TRANSOBTURATOR SLING      Social History:  Social History   Socioeconomic History   Marital status: Divorced    Spouse name: Not on file   Number of children: 1   Years of education: Not on file   Highest education level: Master's degree (e.g., MA, MS, MEng, MEd, MSW, MBA)  Occupational History   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   Social Drivers of Health   Tobacco Use: Low Risk (04/23/2024)   Patient History    Smoking Tobacco Use: Never    Smokeless Tobacco Use: Never    Passive Exposure: Not on file  Financial Resource Strain: Low Risk (04/23/2024)   Overall Financial Resource Strain (CARDIA)    Difficulty of Paying Living Expenses: Not hard at all  Food Insecurity: No Food Insecurity (04/23/2024)   Epic    Worried About Programme Researcher, Broadcasting/film/video in the Last Year: Never true    Ran Out of Food in the Last Year: Never true  Transportation Needs: No Transportation Needs (  04/23/2024)   Epic    Lack of Transportation (Medical): No    Lack of Transportation (Non-Medical): No  Physical Activity: Insufficiently Active (04/23/2024)   Exercise Vital Sign    Days of Exercise per Week: 1 day    Minutes of Exercise per Session: 30 min  Stress: Stress Concern Present (04/23/2024)   [employer] of Occupational Health - Occupational Stress Questionnaire    Feeling of Stress: To some extent  Social Connections: Moderately Integrated (04/23/2024)   Social Connection and Isolation Panel    Frequency of Communication with Friends and Family: Twice a week    Frequency of Social Gatherings with Friends and Family: Once a week    Attends Religious Services: 1 to 4 times per year    Active Member of [employer] or Organizations: Yes    Attends Banker Meetings: 1 to 4 times per year    Marital Status: Divorced  Intimate Partner Violence: Not At Risk (01/09/2024)   Epic    Fear of Current or Ex-Partner: No    Emotionally Abused: No    Physically Abused: No    Sexually Abused: No  Depression (PHQ2-9): Low Risk (05/04/2024)   Depression (PHQ2-9)    PHQ-2 Score: 0  Alcohol Screen: Low Risk (04/23/2024)   Alcohol Screen    Last  Alcohol Screening Score (AUDIT): 0  Housing: Unknown (04/23/2024)   Epic    Unable to Pay for Housing in the Last Year: No    Number of Times Moved in the Last Year: Not on file    Homeless in the Last Year: No  Utilities: Not At Risk (01/09/2024)   Epic    Threatened with loss of utilities: No  Health Literacy: Not on file    Family History:  Family History  Problem Relation Age of Onset   Coronary artery disease Mother    Hypertension Mother    Heart disease Mother    Heart attack Mother    Diabetes Mother    Hyperlipidemia Mother    Kidney disease Mother    Depression Mother    Sleep apnea Mother    Obesity Mother    Heart attack Father    Heart disease Father    Sudden death Father    Hypertension Sister    Hypertension Brother    Colon cancer Neg Hx    Colon polyps Neg Hx    Esophageal cancer Neg Hx    Rectal cancer Neg Hx    Stomach cancer Neg Hx     Medications:  Medications Ordered Prior to Encounter[1]  Allergies:  Allergies[2]    OBJECTIVE:  Physical Exam  There were no vitals filed for this visit. There is no height or weight on file to calculate BMI. No results found.   General: well developed, well nourished, seated, in no evident distress Head: head normocephalic and atraumatic.   Neck: supple with no carotid or supraclavicular bruits Cardiovascular: regular rate and rhythm, no murmurs  Neurologic Exam Mental Status: Awake and fully alert. Oriented to place and time. Recent and remote memory intact. Attention span, concentration and fund of knowledge appropriate. Mood and affect appropriate.  Cranial Nerves: Pupils equal, briskly reactive to light. Extraocular movements full without nystagmus. Visual fields full to confrontation. Hearing intact. Facial sensation intact. Face, tongue, palate moves normally and symmetrically.  Motor: Normal bulk and tone. Normal strength in all tested extremity muscles Gait and Station: Arises from chair without  difficulty. Stance is normal. Gait demonstrates  normal stride length and balance without use of AD.         ASSESSMENT/PLAN: Alexandra Walker is a 69 y.o. year old female    OSA on CPAP :  Compliance report shows satisfactory usage with optimal residual AHI.   Continue current pressure settings 4-20 with EPR 2 Discussed continued nightly usage with ensuring greater than 4 hours nightly for optimal benefit and per insurance purposes.   Continue to follow with DME company [employer] for any needed supplies or CPAP related concerns CPAP set up 07/2021 Glioblastoma: Dx'd in 12/2023 after ED evaluation for headaches  s/p craniotomy 12/2023, s/p radiation, actively receiving chemo F/u with neuro oncologist every 2 weeks as scheduled     Follow up in *** or call earlier if needed   CC:  PCP: Jodie Lavern LITTIE, MD    I personally spent a total of *** minutes in the care of the patient today including {Time Based Coding:[id number]}.     Harlene Bogaert, AGNP-BC  [facility] [address] [place], [place] [address]  Phone [phone] Fax [phone] Note: This document was prepared with digital dictation and possible smart phrase technology. Any transcriptional errors that result from this process are unintentional.         [1]  Current Outpatient Medications on File Prior to Visit  Medication Sig Dispense Refill   aspirin  325 MG tablet Take 325 mg by mouth daily.     butalbital -acetaminophen -caffeine  (FIORICET ) 50-325-40 MG tablet Take 2 tablets by mouth every 4 (four) hours as needed for headache (moderate headache). 60 tablet 0   cholecalciferol  (VITAMIN D ) 1000 UNITS tablet Take 2,000 Units by mouth daily.      cyanocobalamin  (VITAMIN B12) 1000 MCG tablet Take 1,000 mcg by mouth daily.     dexamethasone  (DECADRON ) 2 MG tablet Take 1 tablet (2 mg total) by mouth daily. 30 tablet 0   fluticasone  (FLONASE ) 50 MCG/ACT nasal spray Place 1  spray into both nostrils in the morning and at bedtime. 16 g 6   furosemide  (LASIX ) 40 MG tablet Take 1 tablet (40 mg total) by mouth daily. 90 tablet 1   losartan  (COZAAR ) 100 MG tablet Take 1 tablet (100 mg total) by mouth daily. 90 tablet 3   Lutein 6 MG CAPS Take 6 mg by mouth daily.     metoprolol  succinate (TOPROL -XL) 100 MG 24 hr tablet Take 1 tablet (100 mg total) by mouth daily with or immediately following a meal. 90 tablet 2   Multiple Vitamin (MULTIVITAMIN WITH MINERALS) TABS tablet Take 1 tablet by mouth daily.     ondansetron  (ZOFRAN ) 8 MG tablet Take 1 tablet (8 mg total) by mouth every 8 (eight) hours as needed for nausea or vomiting. May take 30-60 minutes prior to Temodar  administration if nausea/vomiting occurs as needed. 30 tablet 1   potassium chloride  SA (KLOR-CON  M) 20 MEQ tablet Take 1 tablet (20 mEq total) by mouth daily. 90 tablet 1   prochlorperazine  (COMPAZINE ) 10 MG tablet Take 1 tablet (10 mg total) by mouth every 6 (six) hours as needed for nausea or vomiting. 30 tablet 1   rosuvastatin  (CRESTOR ) 20 MG tablet Take 1 tablet (20 mg total) by mouth daily. 90 tablet 3   temozolomide  (TEMODAR ) 100 MG capsule Take 1 capsule (100 mg total) by mouth daily. (Take with ONE 250 mg capsule for total daily dose of 350 mg). Take for 5 days on, 23 days off. Repeat every 28 days. May  take on an empty stomach to decrease nausea & vomiting. 5 capsule 0   temozolomide  (TEMODAR ) 100 MG capsule Take 2 capsules (200 mg total) by mouth daily. (Take with ONE 250 mg capsule for total daily dose of 450 mg). Take for 5 days on, 23 days off. Repeat every 28 days. May take on an empty stomach to decrease nausea & vomiting. 10 capsule 0   temozolomide  (TEMODAR ) 250 MG capsule Take 1 capsule (250 mg total) by mouth daily. (Take with ONE 100 mg capsule for total daily dose of 350 mg). Take for 5 days on, 23 days off. Repeat every 28 days. May take on an empty stomach to decrease nausea & vomiting. 5  capsule 0   temozolomide  (TEMODAR ) 250 MG capsule Take 1 capsule (250 mg total) by mouth daily. (Take with TWO 100 mg capsule for total daily dose of 450 mg). Take for 5 days on, 23 days off. Repeat every 28 days. May take on an empty stomach to decrease nausea & vomiting. 5 capsule 0   traZODone  (DESYREL ) 50 MG tablet Take 0.5-1 tablets (25-50 mg total) by mouth at bedtime as needed for sleep. 30 tablet 3   No current facility-administered medications on file prior to visit.  [2]  Allergies Allergen Reactions   Ace Inhibitors Other (See Comments)    coughing   Dust Mite Extract Other (See Comments)    Sneezing and congestion   Oxycodone  Hcl Other (See Comments) and Nausea And Vomiting    NDC Rniz:[id number]   Oxycodone -Acetaminophen  Nausea And Vomiting   Tree Extract Other (See Comments)    Tree pollen and scrub pollen    Vicodin [Hydrocodone-Acetaminophen ] Nausea And Vomiting   Victoza [Liraglutide] Other (See Comments)    pancreatitis   Adhesive [Tape] Rash    Not on stomach   "

## 2024-05-26 ENCOUNTER — Encounter: Payer: Self-pay | Admitting: Adult Health

## 2024-05-26 ENCOUNTER — Ambulatory Visit: Admitting: Adult Health

## 2024-05-27 ENCOUNTER — Telehealth: Payer: Self-pay | Admitting: Neurology

## 2024-05-27 ENCOUNTER — Other Ambulatory Visit (HOSPITAL_COMMUNITY): Payer: Self-pay

## 2024-05-27 ENCOUNTER — Encounter: Payer: Self-pay | Admitting: Internal Medicine

## 2024-05-27 NOTE — Telephone Encounter (Signed)
 Patient called to reschedule appointment and insisted reschedule with Dr. Chalice.

## 2024-05-28 ENCOUNTER — Encounter: Payer: Self-pay | Admitting: Internal Medicine

## 2024-05-31 ENCOUNTER — Encounter: Payer: Self-pay | Admitting: *Deleted

## 2024-05-31 ENCOUNTER — Encounter: Payer: Self-pay | Admitting: Internal Medicine

## 2024-05-31 NOTE — Progress Notes (Unsigned)
 Printed note for patients attorney.

## 2024-06-02 ENCOUNTER — Ambulatory Visit (HOSPITAL_BASED_OUTPATIENT_CLINIC_OR_DEPARTMENT_OTHER): Admitting: Obstetrics & Gynecology

## 2024-06-02 ENCOUNTER — Encounter (HOSPITAL_BASED_OUTPATIENT_CLINIC_OR_DEPARTMENT_OTHER): Payer: Self-pay

## 2024-06-03 ENCOUNTER — Ambulatory Visit (HOSPITAL_COMMUNITY)
Admission: RE | Admit: 2024-06-03 | Discharge: 2024-06-03 | Disposition: A | Discharge status: Home / Self Care | Source: Ambulatory Visit | Attending: Internal Medicine | Admitting: Internal Medicine

## 2024-06-03 ENCOUNTER — Encounter: Payer: Self-pay | Admitting: Internal Medicine

## 2024-06-03 DIAGNOSIS — C719 Malignant neoplasm of brain, unspecified: Secondary | ICD-10-CM | POA: Insufficient documentation

## 2024-06-03 MED ORDER — GADOBUTROL 1 MMOL/ML IV SOLN
10.0000 mL | Freq: Once | INTRAVENOUS | Status: AC | PRN
  Administered 2024-06-03: 10 mL via INTRAVENOUS

## 2024-06-04 ENCOUNTER — Other Ambulatory Visit: Payer: Self-pay | Admitting: Radiation Therapy

## 2024-06-07 ENCOUNTER — Inpatient Hospital Stay

## 2024-06-08 ENCOUNTER — Other Ambulatory Visit: Payer: Self-pay

## 2024-06-08 ENCOUNTER — Inpatient Hospital Stay: Attending: Internal Medicine

## 2024-06-08 ENCOUNTER — Inpatient Hospital Stay: Admitting: Internal Medicine

## 2024-06-08 ENCOUNTER — Other Ambulatory Visit (HOSPITAL_BASED_OUTPATIENT_CLINIC_OR_DEPARTMENT_OTHER): Payer: Self-pay

## 2024-06-08 VITALS — BP 140/89 | HR 74 | Temp 97.9°F | Resp 20 | Wt 261.3 lb

## 2024-06-08 DIAGNOSIS — C719 Malignant neoplasm of brain, unspecified: Secondary | ICD-10-CM | POA: Diagnosis not present

## 2024-06-08 LAB — CBC WITH DIFFERENTIAL (CANCER CENTER ONLY)
Abs Immature Granulocytes: 0.04 K/uL (ref 0.00–0.07)
Basophils Absolute: 0 K/uL (ref 0.0–0.1)
Basophils Relative: 0 %
Eosinophils Absolute: 0.1 K/uL (ref 0.0–0.5)
Eosinophils Relative: 2 %
HCT: 42.3 % (ref 36.0–46.0)
Hemoglobin: 14 g/dL (ref 12.0–15.0)
Immature Granulocytes: 1 %
Lymphocytes Relative: 9 %
Lymphs Abs: 0.5 K/uL — ABNORMAL LOW (ref 0.7–4.0)
MCH: 28.8 pg (ref 26.0–34.0)
MCHC: 33.1 g/dL (ref 30.0–36.0)
MCV: 87 fL (ref 80.0–100.0)
Monocytes Absolute: 0.3 K/uL (ref 0.1–1.0)
Monocytes Relative: 6 %
Neutro Abs: 4.6 K/uL (ref 1.7–7.7)
Neutrophils Relative %: 82 %
Platelet Count: 173 K/uL (ref 150–400)
RBC: 4.86 MIL/uL (ref 3.87–5.11)
RDW: 12.9 % (ref 11.5–15.5)
WBC Count: 5.6 K/uL (ref 4.0–10.5)
nRBC: 0 % (ref 0.0–0.2)

## 2024-06-08 LAB — CMP (CANCER CENTER ONLY)
ALT: 18 U/L (ref 0–44)
AST: 14 U/L — ABNORMAL LOW (ref 15–41)
Albumin: 3.9 g/dL (ref 3.5–5.0)
Alkaline Phosphatase: 92 U/L (ref 38–126)
Anion gap: 10 (ref 5–15)
BUN: 19 mg/dL (ref 8–23)
CO2: 25 mmol/L (ref 22–32)
Calcium: 9.4 mg/dL (ref 8.9–10.3)
Chloride: 104 mmol/L (ref 98–111)
Creatinine: 0.74 mg/dL (ref 0.44–1.00)
GFR, Estimated: >60 mL/min
Glucose, Bld: 243 mg/dL — ABNORMAL HIGH (ref 70–99)
Potassium: 4.5 mmol/L (ref 3.5–5.1)
Sodium: 140 mmol/L (ref 135–145)
Total Bilirubin: 0.4 mg/dL (ref 0.0–1.2)
Total Protein: 6.5 g/dL (ref 6.5–8.1)

## 2024-06-08 MED ORDER — DEXAMETHASONE 4 MG PO TABS
4.0000 mg | ORAL_TABLET | Freq: Every day | ORAL | 1 refills | Status: AC
  Filled 2024-06-08: qty 60, 60d supply, fill #0

## 2024-06-08 MED ORDER — TEMOZOLOMIDE 100 MG PO CAPS
200.0000 mg | ORAL_CAPSULE | Freq: Every day | ORAL | 0 refills | Status: AC
  Filled 2024-06-08: qty 10, 5d supply, fill #0
  Filled 2024-06-09 (×3): qty 10, 28d supply, fill #0

## 2024-06-08 MED ORDER — TEMOZOLOMIDE 250 MG PO CAPS
250.0000 mg | ORAL_CAPSULE | Freq: Every day | ORAL | 0 refills | Status: AC
  Filled 2024-06-08: qty 5, 5d supply, fill #0
  Filled 2024-06-09 (×2): qty 5, 28d supply, fill #0

## 2024-06-08 NOTE — Progress Notes (Signed)
 "  [facility] at [facility] [address]  [place], [place] [address] [phone]   Interval Evaluation  Date of Service: 06/08/24 Patient Name: Alexandra Walker Patient MRN: [id number] Patient DOB: May 07, 1956 Provider: Arthea MARLA Manns, MD  Identifying Statement:  Alexandra Walker is a 69 y.o. female with right temporal glioblastoma    Oncologic History: Oncology History  Glioblastoma, IDH-wildtype (HCC)  12/29/2023 Surgery   Right temporal craniotomy, resection with Dr. Rosslyn; path is grade 4 glioma   01/26/2024 - 03/05/2024 Radiation Therapy   6 weeks IMRT, concurrent Temozolomide  75mg /m2 with Dr. Izell   04/09/2024 -  Chemotherapy   Initiates adjuvant 5-day Temozolomide  150-200mg /m2      Interval History: Alexandra Walker presents today for follow up, now having completed cycle #2 of 5 day temodar .  She tolerated treatment well overall this month.  Fatigue is more prominent in recent weeks.  Decadron  currently at 1mg  daily.  No new or progressive deficits otherwise.  Remains independent with gait.  No recurrence of UTI symptoms.  Denies seizures, headaches.  H+P (01/08/24) Patient presented to neurologic attention with several days history of difficulty driving/navigating, some disorganization at work, and headaches.  CNS imaging demonstrated an enhancing mass within the right temporal lobe, c/w primary brain tumor.  She underwent craniotomy, resection with Dr. Rosslyn on 12/29/23; pathology demonstrated grade 4 glioma, IDH pending.  Since surgery, she feels closer to her prior baseline, independent with gait.  Decadron  is at 4mg  twice per day, which has caused some insomnia and elevated blood sugar.  Still on post-op Keppra .    Medications: Current Outpatient Medications on File Prior to Visit  Medication Sig Dispense Refill   aspirin  325 MG tablet Take 325 mg by mouth daily.     butalbital -acetaminophen -caffeine  (FIORICET ) 50-325-40 MG tablet Take 2  tablets by mouth every 4 (four) hours as needed for headache (moderate headache). 60 tablet 0   cholecalciferol  (VITAMIN D ) 1000 UNITS tablet Take 2,000 Units by mouth daily.      cyanocobalamin  (VITAMIN B12) 1000 MCG tablet Take 1,000 mcg by mouth daily.     dexamethasone  (DECADRON ) 2 MG tablet Take 1 tablet (2 mg total) by mouth daily. 30 tablet 0   fluticasone  (FLONASE ) 50 MCG/ACT nasal spray Place 1 spray into both nostrils in the morning and at bedtime. 16 g 6   furosemide  (LASIX ) 40 MG tablet Take 1 tablet (40 mg total) by mouth daily. 90 tablet 1   losartan  (COZAAR ) 100 MG tablet Take 1 tablet (100 mg total) by mouth daily. 90 tablet 3   Lutein 6 MG CAPS Take 6 mg by mouth daily.     metoprolol  succinate (TOPROL -XL) 100 MG 24 hr tablet Take 1 tablet (100 mg total) by mouth daily with or immediately following a meal. 90 tablet 2   Multiple Vitamin (MULTIVITAMIN WITH MINERALS) TABS tablet Take 1 tablet by mouth daily.     ondansetron  (ZOFRAN ) 8 MG tablet Take 1 tablet (8 mg total) by mouth every 8 (eight) hours as needed for nausea or vomiting. May take 30-60 minutes prior to Temodar  administration if nausea/vomiting occurs as needed. 30 tablet 1   potassium chloride  SA (KLOR-CON  M) 20 MEQ tablet Take 1 tablet (20 mEq total) by mouth daily. 90 tablet 1   prochlorperazine  (COMPAZINE ) 10 MG tablet Take 1 tablet (10 mg total) by mouth every 6 (six) hours as needed for nausea or vomiting. 30 tablet 1   rosuvastatin  (CRESTOR )  20 MG tablet Take 1 tablet (20 mg total) by mouth daily. 90 tablet 3   temozolomide  (TEMODAR ) 100 MG capsule Take 1 capsule (100 mg total) by mouth daily. (Take with ONE 250 mg capsule for total daily dose of 350 mg). Take for 5 days on, 23 days off. Repeat every 28 days. May take on an empty stomach to decrease nausea & vomiting. 5 capsule 0   temozolomide  (TEMODAR ) 100 MG capsule Take 2 capsules (200 mg total) by mouth daily. (Take with ONE 250 mg capsule for total daily dose  of 450 mg). Take for 5 days on, 23 days off. Repeat every 28 days. May take on an empty stomach to decrease nausea & vomiting. 10 capsule 0   temozolomide  (TEMODAR ) 250 MG capsule Take 1 capsule (250 mg total) by mouth daily. (Take with ONE 100 mg capsule for total daily dose of 350 mg). Take for 5 days on, 23 days off. Repeat every 28 days. May take on an empty stomach to decrease nausea & vomiting. 5 capsule 0   temozolomide  (TEMODAR ) 250 MG capsule Take 1 capsule (250 mg total) by mouth daily. (Take with TWO 100 mg capsule for total daily dose of 450 mg). Take for 5 days on, 23 days off. Repeat every 28 days. May take on an empty stomach to decrease nausea & vomiting. 5 capsule 0   traZODone  (DESYREL ) 50 MG tablet Take 0.5-1 tablets (25-50 mg total) by mouth at bedtime as needed for sleep. 30 tablet 3   No current facility-administered medications on file prior to visit.    Allergies:  Allergies  Allergen Reactions   Ace Inhibitors Other (See Comments)    coughing   Dust Mite Extract Other (See Comments)    Sneezing and congestion   Oxycodone  Hcl Other (See Comments) and Nausea And Vomiting    NDC Rniz:[id number]   Oxycodone -Acetaminophen  Nausea And Vomiting   Tree Extract Other (See Comments)    Tree pollen and scrub pollen    Vicodin [Hydrocodone-Acetaminophen ] Nausea And Vomiting   Victoza [Liraglutide] Other (See Comments)    pancreatitis   Adhesive [Tape] Rash    Not on stomach   Past Medical History:  Past Medical History:  Diagnosis Date   ADD (attention deficit disorder)    Allergy    Anticardiolipin syndrome    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   lasted about 6 hours, Echo did not show anything   Bilateral swelling of feet    Cholelithiasis    Chronic depressive disorder    Chronic diastolic CHF (congestive heart failure) (HCC)    Circadian rhythm sleep disorder, shift work    DDD (degenerative disc disease), 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   Diabetes (HCC)    Diverticulosis     Minimal   Fatty liver    GERD (gastroesophageal reflux disease)    Heart murmur    Hepatic steatosis    History of hiatal hernia 2017   Small   HLA B27 (HLA B27 positive)    Hx of pancreatitis    Hyperlipidemia    Hypertension    IBS (irritable bowel syndrome)    Ischemic colitis    Lactose intolerance    Lumbar scoliosis 2010   Mild   Migraine headache without aura 05/10/2015   Multiple thyroid  nodules    Nephrolithiasis 2014   Left, minimal   Obesity due to excess calories with serious comorbidity 05/24/2019   Palpitations    PONV (postoperative nausea and vomiting)    Prediabetes  Presbyopia    Right bundle branch block    Sepsis (HCC) 01/2016   Sleep apnea    Sleep hypopnea    Vitamin D  deficiency    Past Surgical History:  Past Surgical History:  Procedure Laterality Date   APPLICATION OF CRANIAL NAVIGATION N/A 12/29/2023   Procedure: COMPUTER-ASSISTED NAVIGATION, FOR CRANIAL PROCEDURE;  Surgeon: Rosslyn Dino HERO, MD;  Location: [facility] OR;  Service: Neurosurgery;  Laterality: N/A;   BREAST BIOPSY Left    CERVICAL CONE BIOPSY     CHOLECYSTECTOMY N/A 10/26/2014   Procedure: LAPAROSCOPIC CHOLECYSTECTOMY;  Surgeon: Vicenta Poli, MD;  Location: [facility];  Service: General;  Laterality: N/A;   COLONOSCOPY W/ BIOPSIES  multiple   CRANIOTOMY Right 12/29/2023   Procedure: CRANIOTOMY TUMOR EXCISION;  Surgeon: Rosslyn Dino HERO, MD;  Location: [facility] OR;  Service: Neurosurgery;  Laterality: Right;   CYSTOSCOPY WITH RETROGRADE PYELOGRAM, URETEROSCOPY AND STENT PLACEMENT Left 06/26/2018   Procedure: CYSTOSCOPY WITH RETROGRADE PYELOGRAM, URETEROSCOPY AND STENT PLACEMENT;  Surgeon: Alvaro Hummer, MD;  Location[facility];  Service: Urology;  Laterality: Left;   DIAGNOSTIC LAPAROSCOPY     DILATION AND CURETTAGE OF UTERUS  11/2009   DUPUYTREN CONTRACTURE RELEASE Left    x 3 , Dr Camella   ESOPHAGOGASTRODUODENOSCOPY  multiple   EYE SURGERY      HOLMIUM LASER APPLICATION Left 06/26/2018   Procedure: HOLMIUM LASER APPLICATION;  Surgeon: Alvaro Hummer, MD;  Location: [facility];  Service: Urology;  Laterality: Left;   KNEE ARTHROSCOPY Left 11/2008   LAPAROSCOPIC VAGINAL HYSTERECTOMY WITH SALPINGO OOPHORECTOMY Bilateral 10/03/2015   Procedure: LAPAROSCOPIC ASSISTED VAGINAL HYSTERECTOMY WITH SALPINGECTOMY;  Surgeon: Shanda SHAUNNA Muscat, MD;  Location: [facility];  Service: Gynecology;  Laterality: Bilateral;   LASIK  1990   RK   LEFT FINGER SURGERY     LIVER BIOPSY     MASS EXCISION Left 12/24/2012   Procedure: LEFT EXCISION OF PALMAR MASS X TWO;  Surgeon: Elsie Camella, MD;  Location:  SURGERY CENTER;  Service: Orthopedics;  Laterality: Left;   PHOTOREFRACTIVE KERATOTOMY  1994   RADIAL KERATOTOMY  1994   TRANSOBTURATOR SLING     Social History:  Social History   Socioeconomic History   Marital status: Divorced    Spouse name: Not on file   Number of children: 1   Years of education: Not on file   Highest education level: Master's degree (e.g., MA, MS, MEng, MEd, MSW, MBA)  Occupational History   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   Social Drivers of Health   Tobacco Use: Low Risk (04/23/2024)   Patient History    Smoking Tobacco Use: Never    Smokeless Tobacco Use: Never    Passive Exposure: Not on file  Financial Resource Strain: Low Risk (04/23/2024)   Overall Financial Resource Strain (CARDIA)    Difficulty of Paying Living Expenses: Not hard at all   Food Insecurity: No Food Insecurity (04/23/2024)  Epic    Worried About Programme Researcher, Broadcasting/film/video in the Last Year: Never true    [employer] of Food in the Last Year: Never true  Transportation Needs: No Transportation Needs (04/23/2024)   Epic    Lack of Transportation (Medical): No    Lack of Transportation (Non-Medical): No  Physical Activity: Insufficiently Active (04/23/2024)   Exercise Vital Sign    Days of Exercise per Week: 1 day    Minutes of Exercise per Session: 30 min  Stress: Stress Concern Present (04/23/2024)   [employer] of Occupational Health - Occupational Stress Questionnaire    Feeling of Stress: To some extent  Social Connections: Moderately Integrated (04/23/2024)   Social Connection and Isolation Panel    Frequency of Communication with Friends and Family: Twice a week    Frequency of Social Gatherings with Friends and Family: Once a week    Attends Religious Services: 1 to 4 times per year    Active Member of [employer] or Organizations: Yes    Attends Banker Meetings: 1 to 4 times per year    Marital Status: Divorced  Intimate Partner Violence: Not At Risk (01/09/2024)   Epic    Fear of Current or Ex-Partner: No    Emotionally Abused: No    Physically Abused: No    Sexually Abused: No  Depression (PHQ2-9): Low Risk (05/04/2024)   Depression (PHQ2-9)    PHQ-2 Score: 0  Alcohol Screen: Low Risk (04/23/2024)   Alcohol Screen    Last Alcohol Screening Score (AUDIT): 0  Housing: Unknown (04/23/2024)   Epic    Unable to Pay for Housing in the Last Year: No    Number of Times Moved in the Last Year: Not on file    Homeless in the Last Year: No  Utilities: Not At Risk (01/09/2024)   Epic    Threatened with loss of utilities: No  Health Literacy: Not on file   Family History:  Family History  Problem Relation Age of Onset   Coronary artery disease Mother    Hypertension Mother    Heart disease Mother    Heart attack Mother    Diabetes Mother     Hyperlipidemia Mother    Kidney disease Mother    Depression Mother    Sleep apnea Mother    Obesity Mother    Heart attack Father    Heart disease Father    Sudden death Father    Hypertension Sister    Hypertension Brother    Colon cancer Neg Hx    Colon polyps Neg Hx    Esophageal cancer Neg Hx    Rectal cancer Neg Hx    Stomach cancer Neg Hx     Review of Systems: Constitutional: Doesn't report fevers, chills or abnormal weight loss Eyes: Doesn't report blurriness of vision Ears, nose, mouth, throat, and face: Doesn't report sore throat Respiratory: Doesn't report cough, dyspnea or wheezes Cardiovascular: Doesn't report palpitation, chest discomfort  Gastrointestinal:  Doesn't report nausea, constipation, diarrhea GU: Doesn't report incontinence Skin: Doesn't report skin rashes Neurological: Per HPI Musculoskeletal: Doesn't report joint pain Behavioral/Psych: Doesn't report anxiety  Physical Exam: Vitals:   06/08/24 1150  BP: (!) 140/89  Pulse: 74  Resp: 20  Temp: 97.9 F (36.6 C)  SpO2: 97%   KPS: 90. General: Alert, cooperative, pleasant, in no acute distress Head: Normal EENT: No conjunctival injection or scleral icterus.  Lungs: Resp effort normal Cardiac: Regular rate Abdomen: Non-distended abdomen Skin:  No rashes cyanosis or petechiae. Extremities: No clubbing or edema  Neurologic Exam: Mental Status: Awake, alert, attentive to examiner. Oriented to self and environment. Language is fluent with intact comprehension.  Cranial Nerves: Visual acuity is grossly normal. Visual fields are full. Extra-ocular movements intact. No ptosis. Face is symmetric Motor: Tone and bulk are normal. Power is full in both arms and legs. Reflexes are symmetric, no pathologic reflexes present.  Sensory: Intact to light touch Gait: Normal.   Labs: I have reviewed the data as listed    Component Value Date/Time   NA 139 05/04/2024 1059   NA 139 11/26/2023 1155   K 3.9  05/04/2024 1059   CL 105 05/04/2024 1059   CO2 24 05/04/2024 1059   GLUCOSE 154 (H) 05/04/2024 1059   BUN 12 05/04/2024 1059   BUN 16 11/26/2023 1155   CREATININE 0.70 05/04/2024 1059   CREATININE 0.76 05/05/2020 1631   CALCIUM  9.4 05/04/2024 1059   PROT 6.7 05/04/2024 1059   PROT 6.3 09/03/2021 1219   ALBUMIN 4.2 05/04/2024 1059   ALBUMIN 4.1 09/03/2021 1219   AST 16 05/04/2024 1059   ALT 14 05/04/2024 1059   ALKPHOS 98 05/04/2024 1059   BILITOT 0.4 05/04/2024 1059   GFRNONAA >60 05/04/2024 1059   GFRAA >60 04/20/2018 1323   Lab Results  Component Value Date   WBC 5.6 06/08/2024   NEUTROABS 4.6 06/08/2024   HGB 14.0 06/08/2024   HCT 42.3 06/08/2024   MCV 87.0 06/08/2024   PLT 173 06/08/2024    Imaging:  [facility] Clinician Interpretation: I have personally reviewed the CNS images as listed.  My interpretation, in the context of the patient's clinical presentation, is treatment effect vs true progression  MR BRAIN W WO CONTRAST Result Date: 06/03/2024 EXAM: MRI BRAIN WITH AND WITHOUT CONTRAST 06/03/2024 11:46:58 AM TECHNIQUE: Multiplanar multisequence MRI of the head/brain was performed with and without the administration of intravenous contrast. CONTRAST: 10 mL of Gadavist . COMPARISON: MRI Head Without and With Contrast 04/01/2024; MRI Head Without and With Contrast 12/29/2023. CLINICAL HISTORY: Brain/CNS neoplasm, assess treatment response. FINDINGS: BRAIN AND VENTRICLES: Status post right parietal temporal craniotomy for resection of a lesion within the right posterior temporal lobe. Interval development of an ovoid area of abnormal enhancement within the lateral aspect of the right mid temporal lobe, measuring approximately 3.3 x 2.4 x 2.3 cm. This encompasses the area of previous nodular enhancement noted on the prior exam. There is also now extensive T2 signal hyperintensity throughout the adjacent cerebral white matter. The right temporal lobe is moderately swollen, resulting in  effacement of the cerebral sulci and partial effacement of the suprasellar and perimesencephalic cisterns. There is mild uncal herniation. No acute infarct. No acute intracranial hemorrhage. No hydrocephalus. Normal flow voids. ORBITS: No significant abnormality. SINUSES: No significant abnormality. BONES AND SOFT TISSUES: Normal bone marrow signal. No soft tissue abnormality. IMPRESSION: 1. Status post right parietotemporal craniotomy. Interval development of an ovoid enhancing lesion in the lateral right mid temporal lobe measuring approximately 3.3 x 2.4 x 2.3 cm, encompassing the prior nodular enhancement; primary considerations include tumor progression/recurrence versus treatment-related change (radiation necrosis/pseudoprogression). Recommend correlation with treatment history and short-interval follow-up MRI (approximately 4-6 weeks) to assess evolution. 2. Extensive adjacent T2/FLAIR hyperintensity throughout the right temporal white matter, most consistent with vasogenic edema and/or nonenhancing infiltrative tumor. 3. Moderate right temporal lobe swelling with sulcal effacement and mild uncle herniation with resultant partial effacement of the suprasellar and perimesencephalic cisterns. Electronically signed by:  Evalene Coho MD 06/03/2024 12:40 PM EST RP Workstation: [id number]    Assessment/Plan Glioblastoma, IDH-wildtype (HCC)  Alexandra Walker is clinically stable today, now having completed cycle #2 of adjuvant 5-day Temodar .  MRI brain demonstrates significant increase in burden of enhancing disease, as well as T2/FLAIR signal abnormality within the right temporal lobe.  The rapidity of these changes, as well as the timing, is suspicious for treatment effect, pseudo-progression.  Lack of focal symptoms is also encouraging.  Of course, organic tumor recurrence remains on the differential as well.  Recommended the following: -Initiate decadron  4mg  daily, following 4mg  BID x2 days -Repeat  MRI brain in 1 month -In addition, will arrange for FDG-PET for metabolic characterization -Continue with an additional cycle (#3) of 5-day Temodar  -With further progression and/or hypermetabolic PET, we would consider next line therapies or clinical trial referral  We recommended continuing treatment with cycle #3 Temozolomide  200mg /m2, on for five days and off for twenty three days in twenty eight day cycles. The patient will have a complete blood count performed on days 21 and 28 of each cycle, and a comprehensive metabolic panel performed on day 28 of each cycle. Labs may need to be performed more often. Zofran  will prescribed for home use for nausea/vomiting.   Informed consent was obtained verbally at bedside to proceed with oral chemotherapy.  Chemotherapy should be held for the following:  ANC less than 1,000  Platelets less than 100,000  LFT or creatinine greater than 2x ULN  If clinical concerns/contraindications develop  We ask that Alexandra Walker return to clinic in 1 months with MRI brain, FDG-PET for review prior to cycle #4, or sooner as needed.  All questions were answered. The patient knows to call the clinic with any problems, questions or concerns. No barriers to learning were detected.  The total time spent in the encounter was 40 minutes and more than 50% was on counseling and review of test results   Arthea MARLA Manns, MD Medical Director of Neuro-Oncology [facility] 06/08/24 11:38 AM "

## 2024-06-09 ENCOUNTER — Encounter: Payer: Self-pay | Admitting: Internal Medicine

## 2024-06-09 ENCOUNTER — Telehealth: Payer: Self-pay | Admitting: Internal Medicine

## 2024-06-09 ENCOUNTER — Other Ambulatory Visit: Payer: Self-pay

## 2024-06-09 ENCOUNTER — Other Ambulatory Visit: Payer: Self-pay | Admitting: Pharmacy Technician

## 2024-06-09 NOTE — Telephone Encounter (Signed)
 Scheduled patient for next appointment. Called and spoke with the patient, she is aware.

## 2024-06-09 NOTE — Progress Notes (Signed)
 Specialty Pharmacy Refill Coordination Note  Alexandra Walker is a 69 y.o. female contacted today regarding refills of specialty medication(s) Temozolomide  (TEMODAR )   Patient requested [name] at [facility] [place] date: 06/10/24   Medication will be filled on: 06/09/24  Spoke with patient's daughter. Notified her of copay increase due to manufacture availability. She agrees to $93.82 copay for both doses of temodar .

## 2024-06-10 ENCOUNTER — Other Ambulatory Visit: Payer: Self-pay

## 2024-06-18 ENCOUNTER — Encounter: Payer: Self-pay | Admitting: Internal Medicine

## 2024-06-24 ENCOUNTER — Encounter: Payer: Self-pay | Admitting: Internal Medicine

## 2024-06-25 ENCOUNTER — Telehealth: Payer: Self-pay

## 2024-06-25 ENCOUNTER — Encounter: Payer: Self-pay | Admitting: Family Medicine

## 2024-06-25 NOTE — Telephone Encounter (Signed)
 Call to pt to assess more information regarding her message regarding UTI symptoms x 1 wk and no urine samples given for Dx. Spoke with Alexandra Walker, pt's daughter who stated there is no visible blood in urine; denies fever and flank pain. Reports frequency and pain on urination and is hoping Dr Buckley will call in Cipro  as before to avoid an outing to her PCP. States preferred pharmacy [facility] [place], [place].  Discussed importance of increasing water intake and reduction of soda & other caffeine  drinks as Alexandra Walker reports her mom drinks lots of soda. Dr Buckley made aware of medication request.

## 2024-06-25 NOTE — Telephone Encounter (Signed)
 Spoke with pt's daughter, Maggie and advised per Dr Buckley please have her see her PCP. Maggie voiced understanding and stated she would call for an appt for her mom.

## 2024-06-26 ENCOUNTER — Telehealth

## 2024-07-01 ENCOUNTER — Ambulatory Visit (HOSPITAL_COMMUNITY)

## 2024-07-01 ENCOUNTER — Encounter (HOSPITAL_COMMUNITY)

## 2024-07-05 ENCOUNTER — Inpatient Hospital Stay: Attending: Internal Medicine

## 2024-07-06 ENCOUNTER — Inpatient Hospital Stay: Admitting: Internal Medicine

## 2024-07-06 ENCOUNTER — Inpatient Hospital Stay

## 2024-08-02 ENCOUNTER — Ambulatory Visit (INDEPENDENT_AMBULATORY_CARE_PROVIDER_SITE_OTHER): Admitting: Audiology

## 2024-08-02 ENCOUNTER — Ambulatory Visit (INDEPENDENT_AMBULATORY_CARE_PROVIDER_SITE_OTHER): Admitting: Otolaryngology

## 2024-09-16 ENCOUNTER — Ambulatory Visit: Admitting: Neurology

## 2024-10-18 ENCOUNTER — Ambulatory Visit: Admitting: Adult Health

## 2024-10-22 ENCOUNTER — Ambulatory Visit: Admitting: Family Medicine
